# Patient Record
Sex: Male | Born: 1935 | Race: White | Hispanic: No | Marital: Married | Smoking: Former smoker
Health system: Southern US, Community
[De-identification: ages and names within clinical notes are randomized; demographics above are authoritative.]

## PROBLEM LIST (undated history)

## (undated) DIAGNOSIS — Z85820 Personal history of malignant melanoma of skin: Secondary | ICD-10-CM

## (undated) DIAGNOSIS — R7302 Impaired glucose tolerance (oral): Secondary | ICD-10-CM

## (undated) DIAGNOSIS — I2510 Atherosclerotic heart disease of native coronary artery without angina pectoris: Secondary | ICD-10-CM

## (undated) DIAGNOSIS — M5416 Radiculopathy, lumbar region: Secondary | ICD-10-CM

## (undated) DIAGNOSIS — H269 Unspecified cataract: Secondary | ICD-10-CM

## (undated) DIAGNOSIS — IMO0002 Reserved for concepts with insufficient information to code with codable children: Secondary | ICD-10-CM

## (undated) DIAGNOSIS — G909 Disorder of the autonomic nervous system, unspecified: Secondary | ICD-10-CM

## (undated) DIAGNOSIS — I493 Ventricular premature depolarization: Secondary | ICD-10-CM

## (undated) DIAGNOSIS — I10 Essential (primary) hypertension: Secondary | ICD-10-CM

## (undated) DIAGNOSIS — M1990 Unspecified osteoarthritis, unspecified site: Secondary | ICD-10-CM

## (undated) DIAGNOSIS — F341 Dysthymic disorder: Secondary | ICD-10-CM

## (undated) DIAGNOSIS — J309 Allergic rhinitis, unspecified: Secondary | ICD-10-CM

## (undated) DIAGNOSIS — N529 Male erectile dysfunction, unspecified: Secondary | ICD-10-CM

## (undated) DIAGNOSIS — I4892 Unspecified atrial flutter: Secondary | ICD-10-CM

## (undated) DIAGNOSIS — D649 Anemia, unspecified: Secondary | ICD-10-CM

## (undated) DIAGNOSIS — H9193 Unspecified hearing loss, bilateral: Secondary | ICD-10-CM

## (undated) DIAGNOSIS — W19XXXA Unspecified fall, initial encounter: Secondary | ICD-10-CM

## (undated) DIAGNOSIS — I252 Old myocardial infarction: Secondary | ICD-10-CM

## (undated) DIAGNOSIS — R011 Cardiac murmur, unspecified: Secondary | ICD-10-CM

## (undated) DIAGNOSIS — Z8719 Personal history of other diseases of the digestive system: Secondary | ICD-10-CM

## (undated) DIAGNOSIS — N320 Bladder-neck obstruction: Secondary | ICD-10-CM

## (undated) DIAGNOSIS — C4360 Malignant melanoma of unspecified upper limb, including shoulder: Secondary | ICD-10-CM

## (undated) DIAGNOSIS — K219 Gastro-esophageal reflux disease without esophagitis: Secondary | ICD-10-CM

## (undated) HISTORY — DX: Disorder of the autonomic nervous system, unspecified: G90.9

## (undated) HISTORY — DX: Impaired glucose tolerance (oral): R73.02

## (undated) HISTORY — DX: Essential (primary) hypertension: I10

## (undated) HISTORY — DX: Male erectile dysfunction, unspecified: N52.9

## (undated) HISTORY — DX: Personal history of other diseases of the digestive system: Z87.19

## (undated) HISTORY — PX: TONSILLECTOMY: SUR1361

## (undated) HISTORY — DX: Allergic rhinitis, unspecified: J30.9

## (undated) HISTORY — DX: Reserved for concepts with insufficient information to code with codable children: IMO0002

## (undated) HISTORY — PX: EXPLORATORY LAPAROTOMY: SUR591

## (undated) HISTORY — PX: EYE SURGERY: SHX253

## (undated) HISTORY — PX: CHOLECYSTECTOMY: SHX55

## (undated) HISTORY — DX: Unspecified fall, initial encounter: W19.XXXA

## (undated) HISTORY — DX: Atherosclerotic heart disease of native coronary artery without angina pectoris: I25.10

## (undated) HISTORY — DX: Ventricular premature depolarization: I49.3

## (undated) HISTORY — PX: CATARACT EXTRACTION, BILATERAL: SHX1313

## (undated) HISTORY — DX: Dysthymic disorder: F34.1

## (undated) HISTORY — DX: Malignant melanoma of unspecified upper limb, including shoulder: C43.60

## (undated) HISTORY — PX: HERNIA REPAIR: SHX51

## (undated) HISTORY — DX: Personal history of malignant melanoma of skin: Z85.820

## (undated) HISTORY — PX: OTHER SURGICAL HISTORY: SHX169

## (undated) HISTORY — DX: Bladder-neck obstruction: N32.0

## (undated) HISTORY — DX: Old myocardial infarction: I25.2

## (undated) HISTORY — DX: Unspecified atrial flutter: I48.92

## (undated) HISTORY — DX: Anemia, unspecified: D64.9

## (undated) HISTORY — PX: COLONOSCOPY: SHX174

---

## 1991-04-30 HISTORY — PX: CORONARY ANGIOPLASTY: SHX604

## 1998-12-19 ENCOUNTER — Ambulatory Visit (HOSPITAL_BASED_OUTPATIENT_CLINIC_OR_DEPARTMENT_OTHER): Admission: RE | Admit: 1998-12-19 | Discharge: 1998-12-19 | Payer: Self-pay

## 1999-04-16 ENCOUNTER — Ambulatory Visit (HOSPITAL_COMMUNITY): Admission: RE | Admit: 1999-04-16 | Discharge: 1999-04-16 | Payer: Self-pay

## 1999-07-17 ENCOUNTER — Emergency Department (HOSPITAL_COMMUNITY): Admission: EM | Admit: 1999-07-17 | Discharge: 1999-07-17 | Payer: Self-pay | Admitting: Emergency Medicine

## 2000-01-29 ENCOUNTER — Emergency Department (HOSPITAL_COMMUNITY): Admission: EM | Admit: 2000-01-29 | Discharge: 2000-01-29 | Payer: Self-pay | Admitting: Emergency Medicine

## 2000-01-29 ENCOUNTER — Encounter: Payer: Self-pay | Admitting: Emergency Medicine

## 2000-10-07 ENCOUNTER — Encounter (HOSPITAL_COMMUNITY): Admission: RE | Admit: 2000-10-07 | Discharge: 2000-11-18 | Payer: Self-pay | Admitting: Cardiology

## 2000-10-13 ENCOUNTER — Encounter: Payer: Self-pay | Admitting: Cardiology

## 2000-10-13 ENCOUNTER — Inpatient Hospital Stay (HOSPITAL_COMMUNITY): Admission: EM | Admit: 2000-10-13 | Discharge: 2000-10-26 | Payer: Self-pay | Admitting: Emergency Medicine

## 2000-10-16 ENCOUNTER — Encounter: Payer: Self-pay | Admitting: Cardiology

## 2000-10-17 ENCOUNTER — Encounter: Payer: Self-pay | Admitting: Cardiothoracic Surgery

## 2000-10-17 HISTORY — PX: CORONARY ARTERY BYPASS GRAFT: SHX141

## 2000-10-19 ENCOUNTER — Encounter: Payer: Self-pay | Admitting: Cardiothoracic Surgery

## 2000-10-20 ENCOUNTER — Encounter: Payer: Self-pay | Admitting: Cardiothoracic Surgery

## 2000-10-21 ENCOUNTER — Encounter: Payer: Self-pay | Admitting: Cardiothoracic Surgery

## 2000-10-22 ENCOUNTER — Encounter: Payer: Self-pay | Admitting: Cardiothoracic Surgery

## 2000-10-23 ENCOUNTER — Encounter: Payer: Self-pay | Admitting: Cardiothoracic Surgery

## 2000-12-09 ENCOUNTER — Encounter (HOSPITAL_COMMUNITY): Admission: RE | Admit: 2000-12-09 | Discharge: 2001-03-09 | Payer: Self-pay | Admitting: Cardiology

## 2001-03-05 ENCOUNTER — Encounter: Payer: Self-pay | Admitting: Cardiology

## 2001-03-05 ENCOUNTER — Inpatient Hospital Stay (HOSPITAL_COMMUNITY): Admission: EM | Admit: 2001-03-05 | Discharge: 2001-03-08 | Payer: Self-pay

## 2001-05-04 ENCOUNTER — Ambulatory Visit (HOSPITAL_COMMUNITY): Admission: RE | Admit: 2001-05-04 | Discharge: 2001-05-05 | Payer: Self-pay | Admitting: Internal Medicine

## 2001-05-04 HISTORY — PX: ATRIAL ABLATION SURGERY: SHX560

## 2002-07-05 ENCOUNTER — Encounter: Admission: RE | Admit: 2002-07-05 | Discharge: 2002-07-05 | Payer: Self-pay | Admitting: Orthopedic Surgery

## 2002-07-05 ENCOUNTER — Encounter: Payer: Self-pay | Admitting: Orthopedic Surgery

## 2002-07-06 ENCOUNTER — Ambulatory Visit (HOSPITAL_BASED_OUTPATIENT_CLINIC_OR_DEPARTMENT_OTHER): Admission: RE | Admit: 2002-07-06 | Discharge: 2002-07-06 | Payer: Self-pay | Admitting: Orthopedic Surgery

## 2002-07-06 HISTORY — PX: ARTHRODESIS: SHX136

## 2003-07-26 ENCOUNTER — Encounter: Admission: RE | Admit: 2003-07-26 | Discharge: 2003-07-26 | Payer: Self-pay | Admitting: Orthopedic Surgery

## 2004-10-01 ENCOUNTER — Ambulatory Visit: Payer: Self-pay | Admitting: Cardiology

## 2004-10-03 ENCOUNTER — Ambulatory Visit: Payer: Self-pay | Admitting: Cardiology

## 2005-10-03 ENCOUNTER — Ambulatory Visit: Payer: Self-pay | Admitting: Cardiology

## 2005-12-16 ENCOUNTER — Ambulatory Visit: Payer: Self-pay

## 2006-03-14 ENCOUNTER — Ambulatory Visit: Payer: Self-pay | Admitting: Internal Medicine

## 2006-03-18 ENCOUNTER — Ambulatory Visit: Payer: Self-pay | Admitting: Internal Medicine

## 2006-03-18 LAB — CONVERTED CEMR LAB
ALT: 23 units/L (ref 0–40)
AST: 19 units/L (ref 0–37)
Albumin: 4.1 g/dL (ref 3.5–5.2)
Alkaline Phosphatase: 50 units/L (ref 39–117)
BUN: 23 mg/dL (ref 6–23)
Basophils Absolute: 0 10*3/uL (ref 0.0–0.1)
Basophils Relative: 0.1 % (ref 0.0–1.0)
Bilirubin Urine: NEGATIVE
CO2: 28 meq/L (ref 19–32)
Calcium: 9.2 mg/dL (ref 8.4–10.5)
Chloride: 107 meq/L (ref 96–112)
Chol/HDL Ratio, serum: 2.9
Cholesterol: 111 mg/dL (ref 0–200)
Creatinine, Ser: 0.9 mg/dL (ref 0.4–1.5)
Eosinophil percent: 3.2 % (ref 0.0–5.0)
GFR calc non Af Amer: 88 mL/min
Glomerular Filtration Rate, Af Am: 107 mL/min/{1.73_m2}
Glucose, Bld: 113 mg/dL — ABNORMAL HIGH (ref 70–99)
HCT: 46.7 % (ref 39.0–52.0)
HDL: 37.8 mg/dL — ABNORMAL LOW (ref >39.0)
Hemoglobin, Urine: NEGATIVE
Hemoglobin: 15.4 g/dL (ref 13.0–17.0)
Ketones, ur: NEGATIVE mg/dL
LDL Cholesterol: 57 mg/dL (ref 0–99)
Leukocytes, UA: NEGATIVE
Lymphocytes Relative: 17.1 % (ref 12.0–46.0)
MCHC: 32.9 g/dL (ref 30.0–36.0)
MCV: 90.8 fL (ref 78.0–100.0)
Monocytes Absolute: 0.7 10*3/uL (ref 0.2–0.7)
Monocytes Relative: 9.6 % (ref 3.0–11.0)
Neutro Abs: 5.3 10*3/uL (ref 1.4–7.7)
Neutrophils Relative %: 70 % (ref 43.0–77.0)
Nitrite: NEGATIVE
PSA: 1.75 ng/mL (ref 0.10–4.00)
Platelets: 174 10*3/uL (ref 150–400)
Potassium: 4.5 meq/L (ref 3.5–5.1)
RBC: 5.14 M/uL (ref 4.22–5.81)
RDW: 13.1 % (ref 11.5–14.6)
Sodium: 141 meq/L (ref 135–145)
Specific Gravity, Urine: 1.02 (ref 1.000–1.03)
TSH: 1.46 microintl units/mL (ref 0.35–5.50)
Total Bilirubin: 1.1 mg/dL (ref 0.3–1.2)
Total Protein, Urine: NEGATIVE mg/dL
Total Protein: 6.3 g/dL (ref 6.0–8.3)
Triglyceride fasting, serum: 83 mg/dL (ref 0–149)
Urine Glucose: NEGATIVE mg/dL
Urobilinogen, UA: 0.2 (ref 0.0–1.0)
VLDL: 17 mg/dL (ref 0–40)
WBC: 7.5 10*3/uL (ref 4.5–10.5)
pH: 6.5 (ref 5.0–8.0)

## 2006-09-30 ENCOUNTER — Ambulatory Visit: Payer: Self-pay | Admitting: Cardiology

## 2006-11-27 DIAGNOSIS — C4360 Malignant melanoma of unspecified upper limb, including shoulder: Secondary | ICD-10-CM | POA: Insufficient documentation

## 2006-11-27 DIAGNOSIS — I2510 Atherosclerotic heart disease of native coronary artery without angina pectoris: Secondary | ICD-10-CM | POA: Insufficient documentation

## 2006-11-27 DIAGNOSIS — R351 Nocturia: Secondary | ICD-10-CM

## 2006-11-27 DIAGNOSIS — N401 Enlarged prostate with lower urinary tract symptoms: Secondary | ICD-10-CM | POA: Insufficient documentation

## 2006-11-27 DIAGNOSIS — N529 Male erectile dysfunction, unspecified: Secondary | ICD-10-CM | POA: Insufficient documentation

## 2006-11-27 DIAGNOSIS — I10 Essential (primary) hypertension: Secondary | ICD-10-CM | POA: Insufficient documentation

## 2007-02-03 ENCOUNTER — Ambulatory Visit: Payer: Self-pay | Admitting: Internal Medicine

## 2007-02-10 ENCOUNTER — Encounter: Admission: RE | Admit: 2007-02-10 | Discharge: 2007-02-10 | Payer: Self-pay | Admitting: Internal Medicine

## 2008-02-24 ENCOUNTER — Ambulatory Visit: Payer: Self-pay | Admitting: Cardiology

## 2008-03-02 ENCOUNTER — Ambulatory Visit: Payer: Self-pay

## 2008-03-02 ENCOUNTER — Ambulatory Visit: Payer: Self-pay | Admitting: Cardiology

## 2008-03-02 LAB — CONVERTED CEMR LAB
ALT: 21 units/L (ref 0–53)
AST: 19 units/L (ref 0–37)
Albumin: 4.2 g/dL (ref 3.5–5.2)
Alkaline Phosphatase: 43 units/L (ref 39–117)
BUN: 19 mg/dL (ref 6–23)
Bilirubin, Direct: 0.3 mg/dL (ref 0.0–0.3)
CO2: 31 meq/L (ref 19–32)
Calcium: 9.6 mg/dL (ref 8.4–10.5)
Chloride: 106 meq/L (ref 96–112)
Cholesterol: 127 mg/dL (ref 0–200)
Creatinine, Ser: 0.7 mg/dL (ref 0.4–1.5)
GFR calc Af Amer: 142 mL/min
GFR calc non Af Amer: 117 mL/min
Glucose, Bld: 97 mg/dL (ref 70–99)
HDL: 45.2 mg/dL (ref >39.0)
LDL Cholesterol: 65 mg/dL (ref 0–99)
Potassium: 5.2 meq/L — ABNORMAL HIGH (ref 3.5–5.1)
Sodium: 142 meq/L (ref 135–145)
Total Bilirubin: 1.2 mg/dL (ref 0.3–1.2)
Total CHOL/HDL Ratio: 2.8
Total Protein: 6.5 g/dL (ref 6.0–8.3)
Triglycerides: 84 mg/dL (ref 0–149)
VLDL: 17 mg/dL (ref 0–40)

## 2008-07-28 ENCOUNTER — Ambulatory Visit: Payer: Self-pay | Admitting: Internal Medicine

## 2008-08-23 ENCOUNTER — Ambulatory Visit: Payer: Self-pay | Admitting: Endocrinology

## 2008-08-23 DIAGNOSIS — IMO0002 Reserved for concepts with insufficient information to code with codable children: Secondary | ICD-10-CM | POA: Insufficient documentation

## 2008-09-04 ENCOUNTER — Ambulatory Visit: Payer: Self-pay | Admitting: Internal Medicine

## 2008-09-04 ENCOUNTER — Inpatient Hospital Stay (HOSPITAL_COMMUNITY): Admission: EM | Admit: 2008-09-04 | Discharge: 2008-09-05 | Payer: Self-pay | Admitting: Emergency Medicine

## 2008-09-05 HISTORY — PX: CARDIAC CATHETERIZATION: SHX172

## 2008-09-06 ENCOUNTER — Telehealth: Payer: Self-pay | Admitting: Cardiology

## 2008-09-13 DIAGNOSIS — I252 Old myocardial infarction: Secondary | ICD-10-CM | POA: Insufficient documentation

## 2008-09-13 DIAGNOSIS — F341 Dysthymic disorder: Secondary | ICD-10-CM | POA: Insufficient documentation

## 2008-09-13 DIAGNOSIS — D649 Anemia, unspecified: Secondary | ICD-10-CM | POA: Insufficient documentation

## 2008-09-13 DIAGNOSIS — Z85820 Personal history of malignant melanoma of skin: Secondary | ICD-10-CM | POA: Insufficient documentation

## 2008-09-13 DIAGNOSIS — Z8719 Personal history of other diseases of the digestive system: Secondary | ICD-10-CM | POA: Insufficient documentation

## 2008-09-14 ENCOUNTER — Encounter: Payer: Self-pay | Admitting: Physician Assistant

## 2008-09-14 ENCOUNTER — Ambulatory Visit: Payer: Self-pay | Admitting: Cardiology

## 2008-09-14 DIAGNOSIS — R0602 Shortness of breath: Secondary | ICD-10-CM | POA: Insufficient documentation

## 2008-09-15 ENCOUNTER — Telehealth: Payer: Self-pay | Admitting: Cardiology

## 2008-09-15 LAB — CONVERTED CEMR LAB
BUN: 23 mg/dL (ref 6–23)
CO2: 25 meq/L (ref 19–32)
Calcium: 9.2 mg/dL (ref 8.4–10.5)
Chloride: 111 meq/L (ref 96–112)
Creatinine, Ser: 0.8 mg/dL (ref 0.4–1.5)
GFR calc non Af Amer: 100.21 mL/min (ref >60)
Glucose, Bld: 133 mg/dL — ABNORMAL HIGH (ref 70–99)
Potassium: 4 meq/L (ref 3.5–5.1)
Sodium: 141 meq/L (ref 135–145)

## 2008-09-21 ENCOUNTER — Telehealth: Payer: Self-pay | Admitting: Cardiology

## 2008-09-21 ENCOUNTER — Encounter: Payer: Self-pay | Admitting: Cardiology

## 2008-10-10 ENCOUNTER — Encounter (HOSPITAL_COMMUNITY): Admission: RE | Admit: 2008-10-10 | Discharge: 2009-01-08 | Payer: Self-pay | Admitting: Cardiology

## 2008-10-12 ENCOUNTER — Ambulatory Visit: Payer: Self-pay | Admitting: Internal Medicine

## 2008-10-12 DIAGNOSIS — R42 Dizziness and giddiness: Secondary | ICD-10-CM | POA: Insufficient documentation

## 2008-10-12 DIAGNOSIS — I959 Hypotension, unspecified: Secondary | ICD-10-CM | POA: Insufficient documentation

## 2008-10-19 ENCOUNTER — Encounter: Payer: Self-pay | Admitting: Cardiology

## 2008-10-19 ENCOUNTER — Ambulatory Visit: Payer: Self-pay

## 2008-10-21 ENCOUNTER — Encounter: Admission: RE | Admit: 2008-10-21 | Discharge: 2008-10-21 | Payer: Self-pay | Admitting: Family Medicine

## 2008-11-16 ENCOUNTER — Encounter: Payer: Self-pay | Admitting: Cardiology

## 2008-11-21 ENCOUNTER — Telehealth: Payer: Self-pay | Admitting: Cardiology

## 2008-11-23 ENCOUNTER — Ambulatory Visit: Payer: Self-pay | Admitting: Cardiology

## 2008-11-23 ENCOUNTER — Encounter: Payer: Self-pay | Admitting: Physician Assistant

## 2008-11-23 DIAGNOSIS — R079 Chest pain, unspecified: Secondary | ICD-10-CM | POA: Insufficient documentation

## 2008-11-25 ENCOUNTER — Encounter: Payer: Self-pay | Admitting: Cardiology

## 2008-11-30 ENCOUNTER — Telehealth (INDEPENDENT_AMBULATORY_CARE_PROVIDER_SITE_OTHER): Payer: Self-pay | Admitting: *Deleted

## 2008-12-01 ENCOUNTER — Ambulatory Visit: Payer: Self-pay

## 2008-12-01 ENCOUNTER — Encounter: Payer: Self-pay | Admitting: Cardiology

## 2008-12-12 ENCOUNTER — Ambulatory Visit: Payer: Self-pay | Admitting: Cardiology

## 2009-01-27 ENCOUNTER — Encounter: Payer: Self-pay | Admitting: Cardiology

## 2009-05-15 ENCOUNTER — Ambulatory Visit: Payer: Self-pay | Admitting: Cardiology

## 2009-05-15 DIAGNOSIS — E782 Mixed hyperlipidemia: Secondary | ICD-10-CM | POA: Insufficient documentation

## 2009-05-16 LAB — CONVERTED CEMR LAB
ALT: 20 units/L (ref 0–53)
AST: 26 units/L (ref 0–37)
Albumin: 4.2 g/dL (ref 3.5–5.2)
Alkaline Phosphatase: 45 units/L (ref 39–117)
BUN: 14 mg/dL (ref 6–23)
Bilirubin, Direct: 0.2 mg/dL (ref 0.0–0.3)
CO2: 25 meq/L (ref 19–32)
Calcium: 9.2 mg/dL (ref 8.4–10.5)
Chloride: 110 meq/L (ref 96–112)
Cholesterol: 111 mg/dL (ref 0–200)
Creatinine, Ser: 0.8 mg/dL (ref 0.4–1.5)
GFR calc non Af Amer: 100.03 mL/min (ref >60)
Glucose, Bld: 99 mg/dL (ref 70–99)
HDL: 43.6 mg/dL (ref >39.00)
LDL Cholesterol: 48 mg/dL (ref 0–99)
Potassium: 4.4 meq/L (ref 3.5–5.1)
Sodium: 144 meq/L (ref 135–145)
Total Bilirubin: 0.8 mg/dL (ref 0.3–1.2)
Total CHOL/HDL Ratio: 3
Total Protein: 6.3 g/dL (ref 6.0–8.3)
Triglycerides: 98 mg/dL (ref 0.0–149.0)
VLDL: 19.6 mg/dL (ref 0.0–40.0)

## 2009-06-11 ENCOUNTER — Emergency Department (HOSPITAL_COMMUNITY): Admission: EM | Admit: 2009-06-11 | Discharge: 2009-06-11 | Payer: Self-pay | Admitting: Emergency Medicine

## 2009-06-28 ENCOUNTER — Ambulatory Visit: Payer: Self-pay | Admitting: Cardiovascular Disease

## 2009-06-28 DIAGNOSIS — G903 Multi-system degeneration of the autonomic nervous system: Secondary | ICD-10-CM | POA: Insufficient documentation

## 2009-08-31 ENCOUNTER — Telehealth: Payer: Self-pay | Admitting: Internal Medicine

## 2009-11-07 ENCOUNTER — Ambulatory Visit: Payer: Self-pay | Admitting: Cardiology

## 2010-05-20 ENCOUNTER — Encounter: Payer: Self-pay | Admitting: Otolaryngology

## 2010-05-31 NOTE — Assessment & Plan Note (Signed)
Summary: Cardiology Nuclear Study  Nuclear Med Background Indications for Stress Test: Evaluation for Ischemia, Graft Patency   History: Ablation, Angioplasty, CABG, Echo, Heart Catheterization, Myocardial Infarction, Myocardial Perfusion Study  History Comments: Ablation: For afib/flutter;PTCA x3; '02 MI>CABG x 4;'09 MPS: mild infero-apical defect, no ischemia, EF=64%; 09/04/08 Cath: Patent Grafts, 99% Dx, 70% prox. native LAD 10/19/08 Echo: EF=55-60%  Symptoms: Chest Tightness with Exertion, Dizziness, DOE, Light-Headedness, Palpitations    Nuclear Pre-Procedure Cardiac Risk Factors: Family History - CAD, History of Smoking, Hypertension, Lipids Caffeine/Decaff Intake: none NPO After: 8:00 PM Lungs: Clear IV 0.9% NS with Angio Cath: 20g     IV Site: (R) AC IV Started by: Stanton Kidney EMT-P Chest Size (in) 38     Height (in): 70 Weight (lb): 178 BMI: 25.63  Nuclear Med Study 1 or 2 day study:  1 day     Stress Test Type:  Treadmill/Adenosine Reading MD:  Olga Millers, MD     Referring MD:  Valera Castle, MD Resting Radionuclide:  Technetium 54m Tetrofosmin     Resting Radionuclide Dose:  11.0 mCi  Stress Radionuclide:  Technetium 22m Tetrofosmin     Stress Radionuclide Dose:  33.0 mCi   Stress Protocol Exercise Time (min):  4:00 min     Max HR:  75 bpm Max Systolic BP: 127 mm HgRate Pressure Product:  9525 Dose of Adenosine:  45.3 mg    Stress Test Technologist:  Rea College CMA-N     Nuclear Technologist:  Domenic Polite CNMT  Rest Procedure  Myocardial perfusion imaging was performed at rest 45 minutes following the intraveneous administration of Myoview Technetium 39m Tetrofosmin.  Stress Procedure  The patient received IV adenosine at 140 mcg/kg/min for 4-minutes with concurrent submaximal exercise (1.52mph, 0% grade). There were no significant changes with infusion. Myoview was injected at the 2-minute mark and quantitative spect images were obtained.  QPS Raw Data  Images:  Acuisition technically good; normal left ventricular size. Stress Images:  There is decreased uptake in the apex. Rest Images:  There is decreased uptake in the apex less prominent compared to the stress images. Subtraction (SDS):  These findings are consistent with mild apical thinning and  ischemia. Transient Ischemic Dilatation:  1.12  (Normal <1.22)  Lung/Heart Ratio:  .38  (Normal <0.45)  Quantitative Gated Spect Images QGS EDV:  77 ml QGS ESV:  27 ml QGS EF:  65 % QGS cine images:  Normal wall motion.   Overall Impression  Exercise Capacity: Adenosine with low level exercise. ECG Impression: No significant ST segment change suggestive of ischemia. Overall Impression: Low risk adenosine nuclear study with mild apical thinning and mild apical ischemia as well.  Appended Document: Cardiology Nuclear Study Stable.  Reviewed Juanito Doom, MD  Appended Document: Cardiology Nuclear Study RN s/w Pt made aware of test results.

## 2010-05-31 NOTE — Progress Notes (Signed)
Summary: chest pain  Phone Note From Other Clinic   Caller: Nurse Summary of Call: cardiac rehab states pt having chest pain with excertion. please call and advise what to do. [phone] ask for Terri or Olinty. Initial call taken by: Edman Circle,  November 21, 2008 10:29 AM  Follow-up for Phone Call        talked with Terri at Cardiac  Rehab-stated pt c/o chest pain at Cardiac Rehab this morning-pt was resting and feeling  better- I recommeded ER evaluation if Terri  felt pt needed to be evaluated today-Terri stated that she did not think so given the results of his recent cath-he does have an appt with Dr Daleen Squibb on 12/12/08 and she is requesting to get an earlier appt-I do not see any earlier availability with Dr Daleen Squibb but have given pt appt 11/23/08 with PA     Appended Document: chest pain  Reviewed Juanito Doom, MD

## 2010-05-31 NOTE — Progress Notes (Signed)
Summary: Appt Scheduled with PA  Medications Added NEXIUM 40 MG CPDR (ESOMEPRAZOLE MAGNESIUM)  NITROGLYCERIN 0.4 MG SUBL (NITROGLYCERIN) Place 1 tablet under tongue as directed NEXIUM 40 MG CPDR (ESOMEPRAZOLE MAGNESIUM)  NITROGLYCERIN 0.4 MG SUBL (NITROGLYCERIN) Place 1 tablet under tongue as directed       Phone Note Call from Patient Call back at Home Phone [phone]   Caller: Patient Reason for Call: Talk to Nurse Summary of Call: sunday severe chest pain, shortness breath [facility], wants to see dr Trelon Plush asap pls call pt........  Initial call taken by: Sydell Axon,  Sep 06, 2008 10:10 AM  Follow-up for Phone Call        Phone Call Completed. Pt states he was unaware of Appt with the PA scheduled for 09-22-08 @ 1:45PM. RN reviewed Appt details with the Pt. Pt verbalize understanding. Follow-up by: Bernita Raisin, RN, BSN,  Sep 06, 2008 10:34 AM    New/Updated Medications: NEXIUM 40 MG CPDR (ESOMEPRAZOLE MAGNESIUM)  NITROGLYCERIN 0.4 MG SUBL (NITROGLYCERIN) Place 1 tablet under tongue as directed NEXIUM 40 MG CPDR (ESOMEPRAZOLE MAGNESIUM)  NITROGLYCERIN 0.4 MG SUBL (NITROGLYCERIN) Place 1 tablet under tongue as directed

## 2010-05-31 NOTE — Progress Notes (Signed)
Summary: Nuclear Pre-Procedure  Phone Note Outgoing Call Call back at [facility] Phone [phone]   Call placed by: Stanton Kidney, EMT-P,  November 30, 2008 3:07 PM Action Taken: Phone Call Completed Summary of Call: Reviewed information on Myoview Information Sheet (see scanned document for further details).  Spoke with Pt's wife.    Nuclear Med Background Indications for Stress Test: Evaluation for Ischemia, Graft Patency   History: Ablation, Angioplasty, CABG, Echo, Heart Catheterization, Myocardial Infarction, Myocardial Perfusion Study  History Comments: Ablation: For A. Fib Angioplasty: (x3) '02 MI>CABG x 4 11/09 MPS: mild inf.apical defect, (-) ischemia, EF=64% 09/04/08 Heart Cath: Patent Grafts 10/19/08 Echo: EF=55-60%  Symptoms: Chest Tightness with Exertion, DOE    Nuclear Pre-Procedure Cardiac Risk Factors: Family History - CAD, History of Smoking, Hypertension, Lipids Height (in): 70

## 2010-05-31 NOTE — Assessment & Plan Note (Signed)
Summary: per check out/sf   Visit Type:  rov Primary Provider:  Elon Combs  CC:  sob..no other complaints today.  History of Present Illness: [name] returns today for evaluation and management of his exertional angina and coronary disease.  On his last visit, we increased his beta blocker. This has helped decrease his exertional angina. He still has some shortness of breath with activity. His functional capacity is excellent having remodeled a house from start to finish.  Denies orthopnea, PND or peripheral edema. He's had no palpitations or syncope.  Current Medications (verified): 1)  Zoloft 50 Mg  Tabs (Sertraline Hcl) .... Once Daily 2)  Flomax 0.4 Mg  Cp24 (Tamsulosin Hcl) .... Once Daily 3)  Toprol Xl 50 Mg  Tb24 (Metoprolol Succinate) .... Take 1 and 1/2 Tablet Daily. 4)  Crestor 10 Mg  Tabs (Rosuvastatin Calcium) .... Once Daily 5)  Ambien 10 Mg  Tabs (Zolpidem Tartrate) .... 1/2 Tab At Bedtime As Needed 6)  Proscar 5 Mg Tabs (Finasteride) .Marland Kitchen.. 1 By Mouth Qd 7)  Omeprazole 20 Mg Cpdr (Omeprazole) .Marland Kitchen.. 1 Tab Once Daily 8)  Nitroglycerin 0.4 Mg Subl (Nitroglycerin) .... Place 1 Tablet Under Tongue As Directed 9)  Aspirin 81 Mg Tbec (Aspirin) .... Take One Tablet By Mouth Daily 10)  Nortriptyline Hcl 10 Mg Caps (Nortriptyline Hcl) .Marland Kitchen.. 1 Tab At Bedtime X 1 Week Then 2 Tabs At Bedtime.Fayne Norrie Not Started Yet 11)  Fioricet 50-325-40 Mg Tabs (Butalbital-Apap-Caffeine) .Marland Kitchen.. 1 Tab As Needed 12)  Nitroglycerin 0.4 Mg Subl (Nitroglycerin) .... One Tablet Under Tongue Every 5 Minutes As Needed For Chest Pain---May Repeat Times Three  Allergies: 1)  ! Cipro (Ciprofloxacin Hcl) 2)  ! 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 Arthrodesis of left long finger distal interphalangeal joint with     Kirschner wire fixation times three.  2. Resection of nail fold mucus cyst.   SURGEON:  Katy Fitch. Sypher, M.D.  Proc. Date: 10/17/00  PROCEDURE:  Coronary artery bypass grafting x 4 with the left internal mammary to the left anterior descending coronary artery, sequential reversed saphenous vein graft to the first and second obtuse marginals, reversed saphenous vein graft to the posterior descending coronary artery.  SURGEON:  Gwenith Daily. Tyrone Sage, M.D.  Family History: Last updated: 09/13/2008 Both of his parents were in their mid to late 80s when   they died.  His father had heart disease, but his mother did not.  No   siblings have heart disease.      Social History: Last updated: 09/13/2008  He lives in [place] with his wife.  He is retired   from [employer].  He quit tobacco greater than 50 years ago and does not   abuse alcohol or drugs.      Risk Factors: Smoking Status: quit (11/27/2006)  Review of Systems       negative other than history of present illness  Vital Signs:  Patient profile:   77 year old male Height:      70 inches Weight:      181 pounds BMI:  26.06 Pulse rate:   72 / minute Pulse rhythm:   regular BP sitting:   142 / 80  (left arm) Cuff size:   large  Vitals Entered By: Danielle Rankin, CMA (May 15, 2009 10:35 AM)  Physical Exam  General:  Well developed, well nourished, in no acute distress. Head:  normocephalic and atraumatic Eyes:  PERRLA/EOM intact; conjunctiva and lids normal. Mouth:  Teeth, gums and palate normal. Oral mucosa normal. Neck:  Neck supple, no  JVD. No masses, thyromegaly or abnormal cervical nodes. Chest Troy Combs:  no deformities or breast masses noted Lungs:  Clear bilaterally to auscultation and percussion. Heart:  Non-displaced PMI, chest non-tender; regular rate and rhythm, S1, S2 without murmurs, rubs or gallops. Carotid upstroke normal, no bruit. Normal abdominal aortic size, no bruits. Femorals normal pulses, no bruits. Pedals normal pulses. No edema, no varicosities. Msk:  Back normal, normal gait. Muscle strength and tone normal. Pulses:  pulses normal in all 4 extremities Extremities:  No clubbing or cyanosis. Neurologic:  Alert and oriented x 3. Skin:  Intact without lesions or rashes. Psych:  Normal affect.   Problems:  Medical Problems Added: 1)  Dx of Encounter For Long-term Use of Other Medications  (ICD-V58.69) 2)  Dx of Hyperlipidemia Type Iib / Iii  (ICD-272.2)  Impression & Recommendations:  Problem # 1:  CORONARY ARTERY DISEASE (ICD-414.00) His exertional angina is better. We will continue his current medications. His updated medication list for this problem includes:    Toprol Xl 50 Mg Tb24 (Metoprolol succinate) .Marland Kitchen... Take 1 and 1/2 tablet daily.    Nitroglycerin 0.4 Mg Subl (Nitroglycerin) .Marland Kitchen... Place 1 tablet under tongue as directed    Aspirin 81 Mg Tbec (Aspirin) .Marland Kitchen... Take one tablet by mouth daily    Nitroglycerin 0.4 Mg Subl (Nitroglycerin) ..... One tablet under tongue every 5 minutes as needed for chest pain---may repeat times three  Problem # 2:  OLD MYOCARDIAL INFARCTION (ICD-412)  His updated medication list for this problem includes:    Toprol Xl 50 Mg Tb24 (Metoprolol succinate) .Marland Kitchen... Take 1 and 1/2 tablet daily.    Nitroglycerin 0.4 Mg Subl (Nitroglycerin) .Marland Kitchen... Place 1 tablet under tongue as directed    Aspirin 81 Mg Tbec (Aspirin) .Marland Kitchen... Take one tablet by mouth daily    Nitroglycerin 0.4 Mg Subl (Nitroglycerin) ..... One tablet under tongue every 5 minutes as needed for chest pain---may  repeat times three  Problem # 3:  HYPERLIPIDEMIA TYPE IIB / III (ICD-272.2) Assessment: Unchanged  He states he would like [place] to check these. His insurance has changed. We will have him set up for fasting lipids and a conference metabolic panel. His updated medication list for this problem includes:    Crestor 10 Mg Tabs (Rosuvastatin calcium) ..... Once daily  Orders: TLB-BMP (Basic Metabolic Panel-BMET) (80048-METABOL) TLB-Lipid Panel (80061-LIPID)  Other Orders: TLB-Hepatic/Liver Function Pnl (80076-HEPATIC)  Patient Instructions: 1)  Your physician recommends that you schedule a follow-up appointment in: 6 MONTHS WITH DR Verona Hartshorn DUE JULY 2)  Your physician recommends that you return for lab work [id number] BMET LIPID LIVER  3)  Your physician recommends that you continue on your current medications as directed. Please refer to the Current Medication list given to you today.

## 2010-05-31 NOTE — Progress Notes (Signed)
Summary: med refill  Phone Note Refill Request Message from:  Fax from Pharmacy on Aug 31, 2009 4:54 PM  Refills Requested: Medication #1:  CRESTOR 10 MG  TABS once daily  Medication #2:  PROSCAR 5 MG TABS 1 by mouth qd Next Appointment Scheduled: none Initial call taken by: Lucious Groves,  Aug 31, 2009 4:54 PM    Prescriptions: PROSCAR 5 MG TABS (FINASTERIDE) 1 by mouth qd  #90 x 3   Entered by:   Lucious Groves   Authorized by:   Tresa Garter MD   Signed by:   Lucious Groves on 08/31/2009   Method used:   Faxed to ...       [facility]* (mail-order)             ,          Ph: [phone]       Fax: [phone]   RxID:   [id number] CRESTOR 10 MG  TABS (ROSUVASTATIN CALCIUM) once daily  #90 x 3   Entered by:   Lucious Groves   Authorized by:   Tresa Garter MD   Signed by:   Lucious Groves on 08/31/2009   Method used:   Faxed to ...       [facility]* (mail-order)             ,          Ph: [phone]       Fax: [phone]   RxID:   [id number]

## 2010-05-31 NOTE — Progress Notes (Signed)
Summary: Results: CT and Lab  Phone Note Call from Patient Call back at Home Phone [phone]   Caller: Patient Reason for Call: Talk to Nurse Summary of Call: calling back to speak with ana Initial call taken by: Lorne Skeens,  Sep 15, 2008 2:12 PM  Follow-up for Phone Call        RN attempted to call back, no answering machine. Bernita Raisin, RN, BSN  Sep 15, 2008 2:27 PM  RN s/w Pt re: CT and lab results. Bernita Raisin, RN, BSN  Sep 15, 2008 3:20 PM  Follow-up by: Bernita Raisin, RN, BSN,  Sep 15, 2008 3:20 PM

## 2010-05-31 NOTE — Assessment & Plan Note (Signed)
Summary: 6 MO F/U /CY   Visit Type:  6  mo f/u Primary Provider:  Elon Alas  CC:  sob..pt c/o feeling like throat is constricted says he has felt this for the last 6 mo .Marland Kitchen..no other complaints today.  History of Present Illness: Mr Gehl returns today for evaluation management of his coronary disease.  He has  no angina or ischemic symptoms. His biggest complaint is that of getting lightheaded after he stands up. Sometimes takes several minutes.  He admits 2 having no thirst drive. He also restrict sodium. He does have a history of hypertension which is actually been extremely well controlled.    Clinical Reports Reviewed:  Cardiac Cath:  09/05/2008: Cardiac Cath Findings:   ASSESSMENT AND PLAN:  This is a 77 year old with a history of coronary   artery disease, status post coronary artery bypass graft who presents   with an episode of chest pain as well as exertional dyspnea.  It was   found that all of his grafts were patent.  He has a vein graft to the   distal right coronary artery,  sequential vein graft to the OM1/OM2, and   a left internal mammary artery to the left anterior descending.  There   is a competitive flow down the distal left anterior descending as the   proximal left anterior descending has only about a 70% stenosis in it.   The native mid right coronary artery, the native first obtuse marginal,   and the native second obtuse marginal are all occluded.  I do not see   disease to explain the patient's chest pain as there do not appear to be   lesions that would cause significant ischemia.  There was a 99% stenosis   of proximal first diagonal; however, this vessel is very small and   supplies very little territory.  The patient also complains of dyspnea   on exertion; however, left ventricular systolic function appears normal   and the left ventricular end-diastolic pressure is normal at 7 mmHg.   Our plan at this time would be medical management of  coronary artery   disease.               Marca Ancona, MD    Current Medications (verified): 1)  Zoloft 50 Mg  Tabs (Sertraline Hcl) .... Once Daily 2)  Flomax 0.4 Mg  Cp24 (Tamsulosin Hcl) .... Once Daily 3)  Toprol Xl 50 Mg  Tb24 (Metoprolol Succinate) .... Take 1  Tablet Daily. 4)  Crestor 10 Mg  Tabs (Rosuvastatin Calcium) .... Once Daily 5)  Ambien 10 Mg  Tabs (Zolpidem Tartrate) .... 1/2 Tab At Bedtime As Needed 6)  Proscar 5 Mg Tabs (Finasteride) .Marland Kitchen.. 1 By Mouth Qd 7)  Omeprazole 20 Mg Cpdr (Omeprazole) .Marland Kitchen.. 1 Tab Once Daily 8)  Nitroglycerin 0.4 Mg Subl (Nitroglycerin) .... Place 1 Tablet Under Tongue As Directed 9)  Aspirin 81 Mg Tbec (Aspirin) .... Take One Tablet By Mouth Daily 10)  Nortriptyline Hcl 10 Mg Caps (Nortriptyline Hcl) .... 2 Tab At Bedtime  Allergies: 1)  ! Cipro (Ciprofloxacin Hcl) 2)  ! * Ivp Dye  Review of Systems       negative other than history of present illness  Vital Signs:  Patient profile:   77 year old male Height:      70 inches Weight:      179 pounds BMI:     25.78 Pulse rate:   81 / minute Pulse rhythm:  irregular BP sitting:   110 / 80  (left arm) Cuff size:   large  Vitals Entered By: Danielle Rankin, CMA (November 07, 2009 2:26 PM)  Physical Exam  General:  Well developed, well nourished, in no acute distress. Head:  normocephalic and atraumatic Eyes:  PERRLA/EOM intact; conjunctiva and lids normal. Neck:  Neck supple, no JVD. No masses, thyromegaly or abnormal cervical nodes. Chest Kagan Hietpas:  no deformities or breast masses noted Lungs:  Clear bilaterally to auscultation and percussion. Heart:  Non-displaced PMI, chest non-tender; regular rate and rhythm, S1, S2 without murmurs, rubs or gallops. Carotid upstroke normal, no bruit. Normal abdominal aortic size, no bruits. Femorals normal pulses, no bruits. Pedals normal pulses. No edema, no varicosities. Msk:  Back normal, normal gait. Muscle strength and tone normal. Pulses:  pulses  normal in all 4 extremities Extremities:  No clubbing or cyanosis. Neurologic:  Alert and oriented x 3. Skin:  Intact without lesions or rashes. Psych:  Normal affect.   EKG  Procedure date:  11/07/2009  Findings:      normal sinus rhythm, normal EKG  Impression & Recommendations:  Problem # 1:  ORTHOSTATIC HYPOTENSION (ICD-458.0) Assessment New Clinically, he is orthostatic intermittently. He could admits to not having a thirst drive. I told him this is not unusual in patients as they get older. Flomax is also contributing.  I have asked him to liberalize sodium and to drink more free water.  Problem # 2:  HYPERTENSION (ICD-401.9) Assessment: Improved  His updated medication list for this problem includes:    Toprol Xl 50 Mg Tb24 (Metoprolol succinate) .Marland Kitchen... Take 1  tablet daily.    Aspirin 81 Mg Tbec (Aspirin) .Marland Kitchen... Take one tablet by mouth daily  Problem # 3:  CORONARY ARTERY DISEASE (ICD-414.00) Assessment: Unchanged  His updated medication list for this problem includes:    Toprol Xl 50 Mg Tb24 (Metoprolol succinate) .Marland Kitchen... Take 1  tablet daily.    Nitroglycerin 0.4 Mg Subl (Nitroglycerin) .Marland Kitchen... Place 1 tablet under tongue as directed    Aspirin 81 Mg Tbec (Aspirin) .Marland Kitchen... Take one tablet by mouth daily  Orders: EKG w/ Interpretation (93000)  Patient Instructions: 1)  Your physician recommends that you schedule a follow-up appointment in: 6 months with Dr. Daleen Squibb 2)  Your physician recommends that you continue on your current medications as directed. Please refer to the Current Medication list given to you today. 3)  Try to drink at least 2 extra glasses of water daily

## 2010-05-31 NOTE — Assessment & Plan Note (Signed)
Summary: rov   Visit Type:  Follow-up  CC:  chest pain and sob.  History of Present Illness: This is a 77 year old white male patient, who has history of coronary artery disease, status post CABG x4 in 2002. He was recently admitted to the hospital with chest pain in early May and underwent cardiac catheterization. This revealed patent grafts and normal LV function. He did have a 99% ostial stenosis in the small first diagonal. He also had competitive flow in the LAD. A proximal native LAD had a long 70% stenosis.  The patient was at cardiac rehabilitation and developed some chest tightness 2 days ago. He continues to have this with exercise at home. He says when he walks up his driveway after getting his mail he becomes short of breath. He says he takes very little for him to become short of breath. His Toprol was decreased. A few months back because of fatigue, but he says this has not made his symptoms worse.  Current Medications (verified): 1)  Zoloft 50 Mg  Tabs (Sertraline Hcl) .... Once Daily 2)  Flomax 0.4 Mg  Cp24 (Tamsulosin Hcl) .... Once Daily 3)  Toprol Xl 50 Mg  Tb24 (Metoprolol Succinate) .... Once Daily 4)  Crestor 10 Mg  Tabs (Rosuvastatin Calcium) .... Once Daily 5)  Ambien 10 Mg  Tabs (Zolpidem Tartrate) .Marland Kitchen.. 1 By Mouth At Bedtime As Needed For Insomnia 6)  Proscar 5 Mg Tabs (Finasteride) .Marland Kitchen.. 1 By Mouth Qd 7)  Omeprazole 20 Mg Cpdr (Omeprazole) .... 2  By Mouth Daily 8)  Ecotrin Low Strength 81 Mg  Tbec (Aspirin) .... Once Daily 9)  Nitroglycerin 0.4 Mg Subl (Nitroglycerin) .... Place 1 Tablet Under Tongue As Directed  Allergies: 1)  ! Cipro (Ciprofloxacin Hcl) 2)  ! 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 Neck: No JVD, HJR, Bruit, or thyroid enlargement Lungs: No tachypnea, clear without wheezing, rales, or rhonchi Cardiovascular: RRR, PMI not displaced, heart sounds normal, no murmurs, gallops, bruit, thrill, or heave. Abdomen: BS normal. Soft without organomegaly, masses, lesions or tenderness. Extremities: without cyanosis, clubbing or edema. Good distal pulses bilateral SKin: Warm, no lesions or rashes  Musculoskeletal: No deformities Neuro: no focal signs    EKG  Procedure date:  11/23/2008  Findings:      normal sinus rhythm with nonspecific ST-T wave changes  Impression & Recommendations:  Problem # 1:  CHEST PAIN UNSPECIFIED (ICD-786.50) patient continues to have chest pain, which sounds like angina. Cardiac Degree did not reveal a source of ischemia. He does have a 99% ostial diagonal lesion, as well as competitive flow in the LAD. Because of this we will perform a stress Myoview. I also offered for him  to take Imdur, but he wants to hold off on this at this time. His updated medication list for this problem includes:    Toprol Xl 50 Mg Tb24 (Metoprolol succinate) ..... Once daily    Ecotrin Low Strength 81 Mg Tbec (Aspirin) ..... Once daily    Nitroglycerin 0.4 Mg Subl (Nitroglycerin) .Marland Kitchen... Place 1 tablet under tongue as directed  Orders: EKG w/ Interpretation (93000) Nuclear Stress Test (Nuc Stress Test)  Problem # 2:   HYPERTENSION (ICD-401.9) blood pressure is stable His updated medication list for this problem includes:    Toprol Xl 50 Mg Tb24 (Metoprolol succinate) ..... Once daily    Ecotrin Low Strength 81 Mg Tbec (Aspirin) ..... Once daily  Patient Instructions: 1)  Your physician has requested that you have an adenosine myoview.  For further information please visit [email or web address]  Please follow instruction sheet, as given. 2)  Dr. Daleen Squibb in one month

## 2010-05-31 NOTE — Assessment & Plan Note (Signed)
Summary: med refill/$50/cd   Vital Signs:  Patient profile:   77 year old male Height:      70 inches Weight:      185 pounds BMI:     26.64 Temp:     97.5 degrees F oral Pulse rate:   85 / minute BP sitting:   158 / 84  (left arm)  Vitals Entered By: Tora Perches (July 28, 2008 11:14 AM) Is Patient Diabetic? No   History of Present Illness: F/u HTN, CAD, BPH. Has been out of Rx  Current Medications (verified): 1)  Nexium 40 Mg  Cpdr (Esomeprazole Magnesium) .... Once Daily 2)  Zoloft 50 Mg  Tabs (Sertraline Hcl) .... Once Daily 3)  Ecotrin Low Strength 81 Mg  Tbec (Aspirin) .... Once Daily 4)  Flomax 0.4 Mg  Cp24 (Tamsulosin Hcl) .... Once Daily 5)  Toprol Xl 50 Mg  Tb24 (Metoprolol Succinate) .... Once Daily 6)  Foltx 2.5-25-2 Mg  Tabs (Fa-Pyridoxine-Cyancobalamin) .... Once Daily 7)  Crestor 10 Mg  Tabs (Rosuvastatin Calcium) .... Once Daily 8)  Ambien 10 Mg  Tabs (Zolpidem Tartrate) .Marland Kitchen.. 1 By Mouth At Bedtime As Needed For Insomnia 9)  Avodart 0.5 Mg Caps (Dutasteride) .... Once Daily  Allergies: 1)  ! Cipro (Ciprofloxacin Hcl) 2)  ! * Ivp Dye  Past History:  Past Medical History:    Coronary artery disease    Hypertension     (11/27/2006)  Risk Factors:    Alcohol Use: N/A    >5 drinks/d w/in last 3 months: N/A    Caffeine Use: N/A    Diet: N/A    Exercise: N/A  Risk Factors:    Smoking Status: quit (11/27/2006)    Packs/Day: N/A    Cigars/wk: N/A    Pipe Use/wk: N/A    Cans of tobacco/wk: N/A    Passive Smoke Exposure: N/A  Family History:    Reviewed history and no changes required:  Social History:    Reviewed history and no changes required:  Physical Exam  General:  Well-developed,well-nourished,in no acute distress; alert,appropriate and cooperative throughout examination Eyes:  No corneal or conjunctival inflammation noted. EOMI. Perrla. Funduscopic exam benign, without hemorrhages, exudates or papilledema. Vision grossly normal. Nose:   External nasal examination shows no deformity or inflammation. Nasal mucosa are pink and moist without lesions or exudates. Mouth:  Oral mucosa and oropharynx without lesions or exudates.  Teeth in good repair. Neck:  No deformities, masses, or tenderness noted. Lungs:  Normal respiratory effort, chest expands symmetrically. Lungs are clear to auscultation, no crackles or wheezes. Heart:  Normal rate and regular rhythm. S1 and S2 normal without gallop, murmur, click, rub or other extra sounds. Abdomen:  Bowel sounds positive,abdomen soft and non-tender without masses, organomegaly or hernias noted. Msk:  No deformity or scoliosis noted of thoracic or lumbar spine.   Neurologic:  No cranial nerve deficits noted. Station and gait are normal. Plantar reflexes are down-going bilaterally. DTRs are symmetrical throughout. Sensory, motor and coordinative functions appear intact. Skin:  Intact without suspicious lesions or rashes Psych:  Cognition and judgment appear intact. Alert and cooperative with normal attention span and concentration. No apparent delusions, illusions, hallucinations   Impression & Recommendations:  Problem # 1:  CORONARY ARTERY DISEASE (ICD-414.00) Assessment Comment Only  His updated medication list for this problem includes:    Toprol Xl 50 Mg Tb24 (Metoprolol succinate) ..... Once daily    Ecotrin Low Strength 81 Mg Tbec (Aspirin) .Marland KitchenMarland KitchenMarland KitchenMarland Kitchen  Once daily  Problem # 2:  HYPERTENSION (ICD-401.9) Assessment: Comment Only  His updated medication list for this problem includes:    Toprol Xl 50 Mg Tb24 (Metoprolol succinate) ..... 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 Once daily 2)  Flomax 0.4 Mg Cp24 (Tamsulosin hcl) .... Once daily 3)  Toprol Xl 50 Mg Tb24  (Metoprolol succinate) .... Once daily 4)  Crestor 10 Mg Tabs (Rosuvastatin calcium) .... Once daily 5)  Ambien 10 Mg Tabs (Zolpidem tartrate) .Marland Kitchen.. 1 by mouth at bedtime as needed for insomnia 6)  Proscar 5 Mg Tabs (Finasteride) .Marland Kitchen.. 1 by mouth qd 7)  Omeprazole 20 Mg Cpdr (Omeprazole) .... 2  by mouth daily 8)  Vitamin D3 1000 Unit Tabs (Cholecalciferol) .Marland Kitchen.. 1 by mouth daily 9)  Ecotrin Low Strength 81 Mg Tbec (Aspirin) .... Once daily  Patient Instructions: 1)  Please schedule a follow-up appointment in 3 months. 2)  BMP prior to visit, ICD-9: 3)  Hepatic Panel prior to visit, ICD-9:414.8 596.0 4)  Lipid Panel prior to visit, ICD-9: 5)  TSH prior to visit, ICD-9: 6)  CBC w/ Diff prior to visit, ICD-9: 7)  Urine-dip prior to visit, ICD-9: 8)  PSA prior to visit, ICD-9: Prescriptions: ZOLOFT 50 MG  TABS (SERTRALINE HCL) once daily  #90 x 3   Entered and Authorized by:   Tresa Garter MD   Signed by:   Tresa Garter MD on 07/28/2008   Method used:   Print then Give to Patient   RxID:   [id number] CRESTOR 10 MG  TABS (ROSUVASTATIN CALCIUM) once daily  #90 x 3   Entered and Authorized by:   Tresa Garter MD   Signed by:   Tresa Garter MD on 07/28/2008   Method used:   Print then Give to Patient   RxID:   [id number] TOPROL XL 50 MG  TB24 (METOPROLOL SUCCINATE) once daily  #90 x 3   Entered and Authorized by:   Tresa Garter MD   Signed by:   Tresa Garter MD on 07/28/2008   Method used:   Print then Give to Patient   RxID:   [id number] AMBIEN 10 MG  TABS (ZOLPIDEM TARTRATE) 1 by mouth at bedtime as needed for insomnia  #90 x 1   Entered and Authorized by:   Tresa Garter MD   Signed by:   Tresa Garter MD on 07/28/2008   Method used:   Print then Give to Patient   RxID:   [id number] CRESTOR 10 MG  TABS (ROSUVASTATIN CALCIUM) once daily  #90 x 3   Entered and Authorized by:   Tresa Garter MD   Signed  by:   Tresa Garter MD on 07/28/2008   Method used:   Print then Give to Patient   RxID:   [id number] TOPROL XL 50 MG  TB24 (METOPROLOL SUCCINATE) once daily  #90 x 3   Entered and Authorized by:   Tresa Garter MD   Signed by:   Tresa Garter MD on 07/28/2008   Method used:   Print then Give to Patient   RxID:   [id number] FLOMAX 0.4 MG  CP24 (TAMSULOSIN HCL) once daily  #90 x 3   Entered and Authorized by:  Tresa Garter MD   Signed by:   Tresa Garter MD on 07/28/2008   Method used:   Print then Give to Patient   RxID:   [id number] ZOLOFT 50 MG  TABS (SERTRALINE HCL) once daily  #90 x 3   Entered and Authorized by:   Tresa Garter MD   Signed by:   Tresa Garter MD on 07/28/2008   Method used:   Print then Give to Patient   RxID:   [id number] OMEPRAZOLE 20 MG CPDR (OMEPRAZOLE) 2  by mouth daily  #180 x 3   Entered and Authorized by:   Tresa Garter MD   Signed by:   Tresa Garter MD on 07/28/2008   Method used:   Print then Give to Patient   RxID:   [id number] PROSCAR 5 MG TABS (FINASTERIDE) 1 by mouth qd  #90 x 3   Entered and Authorized by:   Tresa Garter MD   Signed by:   Tresa Garter MD on 07/28/2008   Method used:   Print then Give to Patient   RxID:   [id number]

## 2010-05-31 NOTE — Assessment & Plan Note (Signed)
Summary: 1 MONTH ROV PER MICHELLE/SL   Visit Type:  Follow-up  CC:  sob.  History of Present Illness: This is a 77 year old white male, patient, and wife saw one month ago after undergoing cardiac catheterization at that time. He was still complaining of dyspnea on exertion. We did a d-dimer, which was normal and a CT scan of his chest, which was also normal. We then enrolled him in cardiac rehabilitation, which he just started 2 days ago.  Patient continues to complain of dyspnea on exertion. He says if he bends over to tie his shoe he becomes short of breath. He is under quite a bit of stress at home caring for his wife, who has non-Hodgkin's lymphoma. He has little time for himself and his overall deconditioned from very little physical activity.  Patient also complained of low blood pressure while at cardiac rehabilitation on Monday. He says blood pressure was 100/70. He gets dizzy when he stand up. He has not had any presyncope, or syncope. He also complains of erectile dysfunction.  Current Medications (verified): 1)  Zoloft 50 Mg  Tabs (Sertraline Hcl) .... Once Daily 2)  Flomax 0.4 Mg  Cp24 (Tamsulosin Hcl) .... Once Daily 3)  Toprol Xl 50 Mg  Tb24 (Metoprolol Succinate) .... Once Daily 4)  Crestor 10 Mg  Tabs (Rosuvastatin Calcium) .... Once Daily 5)  Ambien 10 Mg  Tabs (Zolpidem Tartrate) .Marland Kitchen.. 1 By Mouth At Bedtime As Needed For Insomnia 6)  Proscar 5 Mg Tabs (Finasteride) .Marland Kitchen.. 1 By Mouth Qd 7)  Omeprazole 20 Mg Cpdr (Omeprazole) .... 2  By Mouth Daily 8)  Ecotrin Low Strength 81 Mg  Tbec (Aspirin) .... Once Daily 9)  Nitroglycerin 0.4 Mg Subl (Nitroglycerin) .... Place 1 Tablet Under Tongue As Directed  Allergies: 1)  ! Cipro (Ciprofloxacin Hcl) 2)  ! 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  Vital Signs:  Patient profile:   77 year old male Height:      70 inches Weight:      179 pounds Pulse rate:   68 / minute Pulse rhythm:   regular BP sitting:   110 / 70  (left arm)  Vitals Entered By: Jacquelin Hawking, CMA (October 12, 2008 8:23 AM)  Physical Exam  General:   Well-nournished, in no acute distress. Neck: No JVD, HJR, Bruit, or thyroid enlargement Lungs: No tachypnea, clear without wheezing, rales, or rhonchi Cardiovascular: RRR, PMI not displaced, heart sounds normal, no murmurs, gallops, bruit, thrill, or heave. Abdomen: BS normal. Soft without organomegaly, masses, lesions or tenderness. Extremities: without cyanosis, clubbing or edema. Good distal pulses bilateral SKin: Warm, no lesions or rashes  Musculoskeletal: No deformities Neuro: no focal signs    Impression & Recommendations:  Problem # 1:  SHORTNESS OF BREATH (ICD-786.05)  I suspect a lot of this is coming from deconditioning and anxiety, depression. We will order a 2-D echo to rule rule out any other source. Hopefully, cardiac rehabilitation will help him.had recent cardiac catheterization, revealing all grafts were patent.  He did have some competitive flow down the distal LAD, but the disease did not explain the patient's chest pain or symptoms.CT Scan and d-dimer done last month were normal.  His updated medication list for this problem includes:    Toprol Xl 50 Mg Tb24 (Metoprolol succinate) ..... Once daily    Ecotrin Low Strength 81 Mg Tbec (Aspirin)  ..... Once daily  Orders: Echocardiogram (Echo)  Problem # 2:  CORONARY ARTERY DISEASE (ICD-414.00) patient had CABG x4 in 2002. Last cardiac catheterization Sep 04, 2008 revealed grafts to be patent with competitive flow down the distal LAD because of a proximal 70% LAD stenosis and a 99% proximal first diagonal, which was a small vessel and supplies very little territory. Normal LV function His updated medication list for this problem includes:    Toprol Xl 50 Mg Tb24 (Metoprolol succinate) ..... Once daily    Ecotrin Low Strength 81 Mg Tbec (Aspirin) ..... Once daily    Nitroglycerin 0.4 Mg Subl (Nitroglycerin) .Marland Kitchen... Place 1 tablet under tongue as directed  Problem # 3:  ANXIETY DEPRESSION (ICD-300.4) patient is on Zoloft. I asked him to discuss this with his new primary medical doctor  Problem # 4:  ERECTILE DYSFUNCTION, ORGANIC (ICD-607.84) patient is complaining of erectile dysfunction, dizziness, and low blood pressure. We will decrease his Toprol to 25 mg daily to see if this improves  Problem # 5:  HYPOTENSION, UNSPECIFIED (ICD-458.9) patient's blood pressures been running low, associated with some dizziness. Will decrease Toprol to 25 mg daily  Patient Instructions: 1)  Your physician recommends that you schedule a follow-up appointment in: 2 months Dr. Daleen Squibb. 2)  Your physician has requested that you have an echocardiogram.  Echocardiography is a painless test that uses sound waves to create images of your heart. It provides your doctor with information about the size and shape of your heart and how well your heart's chambers and valves are working.  This procedure takes approximately one hour. There are no restrictions for this procedure. 3)  See your urologist & primary care physician 4)  Toprol XL 50mg -take 1/2 daily

## 2010-05-31 NOTE — Assessment & Plan Note (Signed)
Summary: PAIN COMING UP BACK OF LEG/ STARTED TO DAY/ $50 /NWS   Vital Signs:  Patient profile:   77 year old male Height:      70 inches Weight:      187 pounds BMI:     26.93 Temp:     97.5 degrees F oral Pulse rate:   68 / minute BP sitting:   116 / 64  (left arm) Cuff size:   regular  Vitals Entered By: Bill Salinas CMA (August 23, 2008 1:49 PM) CC: pt c/o pain in Left lower leg that started today, pt described pain as stabbing pain and also burning/ ab   CC:  pt c/o pain in Left lower leg that started today and pt described pain as stabbing pain and also burning/ ab.  History of Present Illness: 9 hrs of moderate pain at the posterior aspect of the right leg and the lateral aspect of the left thigh.  no associated numbness.  unable to cite precip factor such a local injury.    Current Medications (verified): 1)  Zoloft 50 Mg  Tabs (Sertraline Hcl) .... Once Daily 2)  Flomax 0.4 Mg  Cp24 (Tamsulosin Hcl) .... Once Daily 3)  Toprol Xl 50 Mg  Tb24 (Metoprolol Succinate) .... Once Daily 4)  Crestor 10 Mg  Tabs (Rosuvastatin Calcium) .... Once Daily 5)  Ambien 10 Mg  Tabs (Zolpidem Tartrate) .Marland Kitchen.. 1 By Mouth At Bedtime As Needed For Insomnia 6)  Proscar 5 Mg Tabs (Finasteride) .Marland Kitchen.. 1 By Mouth Qd 7)  Omeprazole 20 Mg Cpdr (Omeprazole) .... 2  By Mouth Daily 8)  Vitamin D3 1000 Unit  Tabs (Cholecalciferol) .Marland Kitchen.. 1 By Mouth Daily 9)  Ecotrin Low Strength 81 Mg  Tbec (Aspirin) .... Once Daily  Allergies (verified): 1)  ! Cipro (Ciprofloxacin Hcl) 2)  ! * Ivp Dye  Past History:  Past Medical History:    Coronary artery disease    Hypertension     (11/27/2006)  Review of Systems       no change in bowel or bladder function.  Physical Exam  General:  normal appearance.   Pulses:  left dp is intact Extremities:  the lfet leg has no tenderness/swelling/erythema Skin:  no rash on the left leg   Impression & Recommendations:  Problem # 1:  LUMBAR RADICULOPATHY, LEFT  (ICD-724.4) uncertain etiology  Medications Added to Medication List This Visit: 1)  Medrol (pak) 4 Mg Tabs (Methylprednisolone) .... As dir 2)  Vicodin 5-500 Mg Tabs (Hydrocodone-acetaminophen) .Marland Kitchen.. 1 q4h as needed pain  Other Orders: Est. Patient Level III (64403)  Patient Instructions: 1)  medrol dosepack 2)  vicodin 5/500 as needed 3)  call few days if not better, to consider mri spine Prescriptions: VICODIN 5-500 MG TABS (HYDROCODONE-ACETAMINOPHEN) 1 q4h as needed pain  #50 x 1   Entered and Authorized by:   Minus Breeding MD   Signed by:   Minus Breeding MD on 08/23/2008   Method used:   Print then Give to Patient   RxID:   [id number] MEDROL (PAK) 4 MG TABS (METHYLPREDNISOLONE) as dir  #1 pack x 0   Entered and Authorized by:   Minus Breeding MD   Signed by:   Minus Breeding MD on 08/23/2008   Method used:   Electronically to        [facility]* (retail)       [address]       [place], [place]  [id number]       Ph: [phone]       Fax: [phone]   RxID:   [id number]

## 2010-05-31 NOTE — Miscellaneous (Signed)
Summary: [facility]  [facility]   Imported By: Roderic Ovens 12/08/2008 13:44:51  _____________________________________________________________________  External Attachment:    Type:   Image     Comment:   External Document

## 2010-05-31 NOTE — Progress Notes (Signed)
Summary: CARDIAC REHAB REFERRAL  Phone Note Call from Patient Call back at [facility] Phone [phone]   Caller: Patient Reason for Call: Talk to Nurse Complaint: Urinary/GYN Problems Summary of Call: PATIENT WOULD LIKE TO KNOW WHERE THINGS STAND WITH CARDIAC REHAB REFERRAL. Initial call taken by: Burnard Leigh,  Sep 21, 2008 12:06 PM  Follow-up for Phone Call        Left message for cardiac rehab to call back about status of resuming rehab. Julieta Gutting, RN, BSN  Sep 21, 2008 12:42 PM Spoke with Shanda Bumps in Cardiac Rehab and the pt meets the criteria for the maintenance program.  The maintenance program is $68/month out of pocket.  I requested an order be faxed to our office for completion.  I spoke with the pt and made him aware of the cost and he would like to proceed with cardiac rehab.  Will await fax.  Follow-up by: Julieta Gutting, RN, BSN,  Sep 21, 2008 2:05 PM  Additional Follow-up for Phone Call Additional follow up Details #1::        Form completed and faxed. Additional Follow-up by: Bernita Raisin, RN, BSN,  September 28, 2008 4:24 PM

## 2010-05-31 NOTE — Assessment & Plan Note (Signed)
Summary: per check out with pa/saf   Visit Type:  1 yr f/u Primary Provider:  Elon Alas  CC:  pt states he has had some chest discomfort and still has some sob...  History of Present Illness: Troy Combs returns today with continued exertional angina. He states it usually occurs after he's been exercising for 10 or 15 minutes. He is very careful to warmup prior to exercise. He has been monitoring cardiac rehabilitation.  He is undergoing a lot of stress in his life his wife just finishing chemotherapy for cancer. He denies any problems with insomnia, depression though he is on antidepressant, palpitations, but does complain of some dyspnea on exertion.  His last catheterization was in May which showed patent grafts except for a 99% in the ostium of a small diagonal one. He also competitive flow in his LAD with a 70% proximal lesion. We performed a stress Myoview after this showed mild apical ischemia. This points towards the LAD and left internal mammary graft.  Clinical Reports Reviewed:  Cardiac Cath:  09/05/2008: Cardiac Cath Findings:   ASSESSMENT AND PLAN:  This is a 77 year old with a history of coronary   artery disease, status post coronary artery bypass graft who presents   with an episode of chest pain as well as exertional dyspnea.  It was   found that all of his grafts were patent.  He has a vein graft to the   distal right coronary artery,  sequential vein graft to the OM1/OM2, and   a left internal mammary artery to the left anterior descending.  There   is a competitive flow down the distal left anterior descending as the   proximal left anterior descending has only about a 70% stenosis in it.   The native mid right coronary artery, the native first obtuse marginal,   and the native second obtuse marginal are all occluded.  I do not see   disease to explain the Troy Combs's chest pain as there do not appear to be   lesions that would cause significant ischemia.  There was  a 99% stenosis   of proximal first diagonal; however, this vessel is very small and   supplies very little territory.  The Troy Combs also complains of dyspnea   on exertion; however, left ventricular systolic function appears normal   and the left ventricular end-diastolic pressure is normal at 7 mmHg.   Our plan at this time would be medical management of coronary artery   disease.               Marca Ancona, MD    Current Medications (verified): 1)  Zoloft 50 Mg  Tabs (Sertraline Hcl) .... Once Daily 2)  Flomax 0.4 Mg  Cp24 (Tamsulosin Hcl) .... Once Daily 3)  Toprol Xl 50 Mg  Tb24 (Metoprolol Succinate) .... Once Daily 4)  Crestor 10 Mg  Tabs (Rosuvastatin Calcium) .... Once Daily 5)  Ambien 10 Mg  Tabs (Zolpidem Tartrate) .... 1/2 Tab At Bedtime As Needed 6)  Proscar 5 Mg Tabs (Finasteride) .Marland Kitchen.. 1 By Mouth Qd 7)  Omeprazole 20 Mg Cpdr (Omeprazole) .Marland Kitchen.. 1 Tab Once Daily 8)  Nitroglycerin 0.4 Mg Subl (Nitroglycerin) .... Place 1 Tablet Under Tongue As Directed 9)  Aspirin 81 Mg Tbec (Aspirin) .... Take One Tablet By Mouth Daily 10)  Nortriptyline Hcl 10 Mg Caps (Nortriptyline Hcl) .Marland Kitchen.. 1 Tab At Bedtime X 1 Week Then 2 Tabs At Bedtime.Fayne Norrie Not Started Yet 11)  Fioricet [phone] Mg  Tabs (Butalbital-Apap-Caffeine) .Marland Kitchen.. 1 Tab As Needed  Allergies: 1)  ! Cipro (Ciprofloxacin Hcl) 2)  ! 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 Arthrodesis of left long finger distal interphalangeal joint with     Kirschner wire fixation times three.  2. Resection of nail fold mucus cyst.   SURGEON:  Katy Fitch. Sypher, M.D.  Proc. Date: 10/17/00  PROCEDURE:  Coronary artery bypass grafting x 4 with the left internal mammary to the left anterior descending coronary artery, sequential reversed saphenous vein graft to the first and second obtuse marginals, reversed saphenous vein graft to the posterior descending coronary artery.  SURGEON:  Gwenith Daily. Tyrone Sage, M.D.  Family History: Last updated: 09/13/2008 Both of his parents were in their mid to late 80s when   they died.  His father had heart disease, but his mother did not.  No   siblings have heart disease.      Social History: Last updated: 09/13/2008  He lives in [place] with his wife.  He is retired   from [employer].  He quit tobacco greater than 50 years ago and does not   abuse alcohol or drugs.      Risk Factors: Smoking Status: quit (11/27/2006)  Review of Systems       negative other than the history of present illness  Vital Signs:  Troy Combs profile:   77 year old male Height:      70 inches Weight:      180 pounds BMI:     25.92 Pulse rate:   64 / minute Pulse rhythm:   regular BP sitting:   124 / 70  (left arm) Cuff size:   large  Vitals Entered By: Danielle Rankin, CMA (December 12, 2008 1:55 PM)  Physical Exam  General:  Well developed, well nourished, in no acute distress. Head:  normocephalic and atraumatic Eyes:  PERRLA/EOM intact; conjunctiva and lids normal. Mouth:  Teeth, gums and palate normal. Oral mucosa normal. Neck:  Neck supple, no JVD. No masses, thyromegaly or abnormal cervical nodes. Lungs:  Clear bilaterally to auscultation and percussion. Abdomen:  Bowel sounds positive; abdomen soft and non-tender without masses, organomegaly, or hernias noted. No hepatosplenomegaly. Msk:  decreased ROM.  decreased ROM.   Pulses:  pulses  normal in all 4 extremities Extremities:  No clubbing or cyanosis. Neurologic:  Alert and oriented x 3. Skin:  Intact without lesions or rashes.   Impression & Recommendations:  Problem # 1:  CHEST PAIN UNSPECIFIED (ICD-786.50)  The following medications were removed from the medication list:    Ecotrin Low Strength 81 Mg Tbec (Aspirin) ..... Once daily His updated medication list for this problem includes:    Toprol Xl 50 Mg Tb24 (Metoprolol succinate) .Marland Kitchen... Take 1 and 1/2 tablet daily.    Nitroglycerin 0.4 Mg Subl (Nitroglycerin) .Marland Kitchen... Place 1 tablet under tongue as directed    Aspirin 81 Mg Tbec (Aspirin) .Marland Kitchen... Take one tablet by mouth daily    Nitroglycerin 0.4 Mg Subl (Nitroglycerin) ..... One tablet under tongue every 5 minutes as needed for chest pain---may repeat times three  His discomfort sounds as if his exertional angina. Increased his Toprol to 75 mg p.o. q.a.m. He will carry sublingual nitroglycerin he uses p.r.n. We will see him back for close followup in 3 months.  Complete Medication List: 1)  Zoloft 50 Mg Tabs (Sertraline hcl) .... Once daily 2)  Flomax 0.4 Mg Cp24 (Tamsulosin hcl) .... Once daily 3)  Toprol Xl 50 Mg Tb24 (Metoprolol succinate) .... Take 1 and 1/2 tablet daily. 4)  Crestor 10 Mg Tabs (Rosuvastatin calcium) .... Once daily 5)  Ambien 10 Mg Tabs (Zolpidem tartrate) .... 1/2 tab at bedtime as needed 6)  Proscar 5 Mg Tabs (Finasteride) .Marland Kitchen.. 1 by mouth qd 7)  Omeprazole 20 Mg Cpdr (Omeprazole) .Marland Kitchen.. 1 tab once daily 8)  Nitroglycerin 0.4 Mg Subl (Nitroglycerin) .... Place 1 tablet under tongue as directed 9)  Aspirin 81 Mg Tbec (Aspirin) .... Take one tablet by mouth daily 10)  Nortriptyline Hcl 10 Mg Caps (Nortriptyline hcl) .Marland Kitchen.. 1 tab at bedtime x 1 week then 2 tabs at bedtime.Chinonso Aguirre not started yet 11)  Fioricet 50-325-40 Mg Tabs (Butalbital-apap-caffeine) .Marland Kitchen.. 1 tab as needed 12)  Nitroglycerin 0.4 Mg Subl (Nitroglycerin) .... One tablet under  tongue every 5 minutes as needed for chest pain---may repeat times three  Troy Combs Instructions: 1)  Your physician has recommended you make the following change in your medication:  Please Increase Toprol XL to 75 Mg daily. 2)  Your physician recommends that you schedule a follow-up appointment in: 3 Months. 3)  Your physician recommended you take 1 tablet (or 1 spray) under tongue at onset of chest pain; you may repeat every 5 minutes for up to 3 doses. If 3 or more doses are required, call 911 and proceed to the ER immediately. Prescriptions: TOPROL XL 50 MG  TB24 (METOPROLOL SUCCINATE) Take 1 and 1/2 tablet daily.  #135 x 3   Entered by:   Bernita Raisin, RN, BSN   Authorized by:   Gaylord Shih, MD, [facility]   Signed by:   Bernita Raisin, RN, BSN on 12/12/2008   Method used:   Electronically to        [facility]* (mail-order)             ,          Ph: [phone]       Fax: [phone]   RxID:   [id number] NITROGLYCERIN 0.4 MG SUBL (NITROGLYCERIN) One tablet under tongue every 5 minutes as needed for chest pain---may repeat times three  #25 x 7   Entered by:   Bernita Raisin, RN, BSN   Authorized by:   Gaylord Shih, MD, [facility]   Signed by:   Bernita Raisin, RN, BSN on 12/12/2008   Method used:   Electronically to        [facility]* (retail)       [address]       [place], [place]  [id number]       Ph: [phone]       Fax: [phone]   RxID:   [id number]

## 2010-05-31 NOTE — Miscellaneous (Signed)
Summary: [facility]  [facility]   Imported By: Roderic Ovens 12/15/2008 14:21:08  _____________________________________________________________________  External Attachment:    Type:   Image     Comment:   External Document

## 2010-05-31 NOTE — Assessment & Plan Note (Signed)
Summary: eph/. gd   Visit Type:  Follow-up  CC:  sob and "not feeling up to Par".  History of Present Illness: This is a 77 year old white male patient, who recently underwent cardiac catheterization because of recurrent chest pain, and dyspnea on exertion. He has a history of CABG x4 in 2002. Cardiac catheter Sep 04, 2008 revealed all grafts to be patent. His SVG to the distal RCA, SVG to the OM1, and a lump 2, and LIMA to the LAD were all patent. There is competitive flow down the distal LAD has a proximal LAD has only about a 70% stenosis in the period. They did not find a disease to explain the patient's chest pain. There was a 99% proximal first diagonal however, this vessel was very small and supplies very little territory. His LV function and left ventricular end-diastolic pressure were normal. Medical management was recommended.  Since the patient at home. He continues to complain of dyspnea on exertion, and some chest tightness with exertion. He says he only feels about 65% and is not back to normal. He says he gets out of breath with little activity is going to bed at night.  The patient does admit to quite a significant amount of stress in his life. His wife has non-Hodgkin's lymphoma, then he is caring for her. He's had to give up all exercise and working on his home because of caring for her. He gets very little activity and may have some deconditioning. He has not smoked in over 60 years.  Current Medications (verified): 1)  Zoloft 50 Mg  Tabs (Sertraline Hcl) .... Once Daily 2)  Flomax 0.4 Mg  Cp24 (Tamsulosin Hcl) .... Once Daily 3)  Toprol Xl 50 Mg  Tb24 (Metoprolol Succinate) .... Once Daily 4)  Crestor 10 Mg  Tabs (Rosuvastatin Calcium) .... Once Daily 5)  Ambien 10 Mg  Tabs (Zolpidem Tartrate) .Marland Kitchen.. 1 By Mouth At Bedtime As Needed For Insomnia 6)  Proscar 5 Mg Tabs (Finasteride) .Marland Kitchen.. 1 By Mouth Qd 7)  Omeprazole 20 Mg Cpdr (Omeprazole) .... 2  By Mouth Daily 8)  Ecotrin Low  Strength 81 Mg  Tbec (Aspirin) .... Once Daily 9)  Vicodin 5-500 Mg Tabs (Hydrocodone-Acetaminophen) .Marland Kitchen.. 1 Q4h As Needed Pain 10)  Nitroglycerin 0.4 Mg Subl (Nitroglycerin) .... Place 1 Tablet Under Tongue As Directed  Allergies: 1)  ! Cipro (Ciprofloxacin Hcl) 2)  ! 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  He is retired         from [employer].  He quit tobacco greater than 50 years ago and does not         abuse alcohol or drugs.            Review of Systems       see history of present illness. All other  systems negative  Vital Signs:  Patient profile:   77 year old male Height:      70 inches Weight:      183 pounds Pulse rate:   68 / minute Pulse rhythm:   regular BP sitting:   114 / 70  (left arm)  Vitals Entered By: Jacquelin Hawking, CMA (Sep 14, 2008 8:44 AM)  Physical Exam  General:   Well-nournished, in no acute distress. Neck: No JVD, HJR, Bruit, or thyroid enlargement Lungs: No tachypnea, clear without wheezing, rales, or rhonchi Cardiovascular: RRR, PMI not displaced, heart sounds normal, no murmurs, gallops, bruit, thrill, or heave. Abdomen: BS normal. Soft without organomegaly, masses, lesions or tenderness. Extremities: right groin without hematoma or hemorrhage nt cyanosis, clubbing or edema. Good distal pulses bilateral SKin: Warm, no lesions or rashes   Musculoskeletal: No deformities Neuro: no focal signs    EKG  Procedure date:  09/14/2008  Findings:      normal sinus rhythm, no acute change  Impression & Recommendations:  Problem # 1:  SHORTNESS OF BREATH (ICD-786.05) patient continues to have shortness of breath with very little activity, such as walking, and getting ready for bed. We will do a d-dimer and CT of his chest today. If these are normal. He may have deconditioning and stress as causes of his dyspnea on exertion. He is also interested in resuming cardiac rehabilitation to get back on track. His updated medication list for this problem includes:    Toprol Xl 50 Mg Tb24 (Metoprolol succinate) ..... Once daily    Ecotrin Low Strength 81 Mg Tbec (Aspirin) ..... Once daily  Orders: EKG w/ Interpretation (93000) CT Scan  (CT Scan)  Problem # 2:  CORONARY ARTERY DISEASE (ICD-414.00) cardiac catheter revealed all grafts patent, with normal LV function. Please see history of present illness for details and catheter report. The following medications were removed from the medication list:    Nitroglycerin 0.4 Mg Subl (Nitroglycerin) .Marland Kitchen... Place 1 tablet under tongue as directed His updated medication list for this problem includes:    Toprol Xl 50 Mg Tb24 (Metoprolol succinate) ..... Once daily    Ecotrin Low Strength 81 Mg Tbec (Aspirin) ..... Once daily    Nitroglycerin 0.4 Mg Subl (Nitroglycerin) .Marland Kitchen... Place 1 tablet under tongue as directed  Orders: T-D-Dimer Fibrin Derivatives Quantitive [phone]) TLB-BMP (Basic Metabolic Panel-BMET) (80048-METABOL)  Problem # 3:  ANXIETY DEPRESSION (ICD-300.4) patient has a significant amount of anxiety as he is caring for his wife with non-Hodgkin's lymphoma as well as other stresses at home.  Patient Instructions: 1)  Your physician recommends that you schedule a follow-up appointment in: 1 month with Dr. Daleen Squibb 2)  patient will have a d-dimer bmet and a CT of his chest to  rule out PE. If this is negative we will recommend cardiac rehabilitation to increase his exercise tolerance and help decrease the stress in his life. We'll follow up with me in one month. If Dr. Daleen Squibb is unavailable.   Appended Document: Orders Update cardiac rehab    Clinical Lists Changes  Orders: Added new Referral order of Cardiac Rehabilitation (Cardiac Rehab) - Signed

## 2010-05-31 NOTE — Assessment & Plan Note (Signed)
Summary: ROV/ GD   Primary Provider:  Elon Alas  CC:  dizziness last wk.  History of Present Illness: This is a 77 year old white male patient has a history of coronary artery disease status post CABG x4 in 2002. He comes in today because of recurrent dizziness over the past month. He says when he gets up from a lying or sitting position to a standing position and walks across a room he becomes extremely dizzy. He denies any syncope. Approximately 3 months ago his beta blocker was increased because of some exertional angina and shortness of breath with activity. He admits to not drinking a lot of water or fluids. He denies chest pain, palpitations, or significant dyspnea. He says he will get out of breath if he walks up a steep incline, but this has not changed recently.  Problems Prior to Update: 1)  Orthostatic Hypotension  (ICD-458.0) 2)  Encounter For Long-term Use of Other Medications  (ICD-V58.69) 3)  Hyperlipidemia Type Iib / Iii  (ICD-272.2) 4)  Chest Pain Unspecified  (ICD-786.50) 5)  Hypotension, Unspecified  (ICD-458.9) 6)  Dizziness  (ICD-780.4) 7)  Shortness of Breath  (ICD-786.05) 8)  Old Myocardial Infarction  (ICD-412) 9)  Hypertension  (ICD-401.9) 10)  Gastroesophageal Reflux Disease, Hx of  (ICD-V12.79) 11)  Melanoma, Hx of  (ICD-V10.82) 12)  Anxiety Depression  (ICD-300.4) 13)  Unspecified Anemia  (ICD-285.9) 14)  Lumbar Radiculopathy, Left  (ICD-724.4) 15)  Erectile Dysfunction, Organic  (ICD-607.84) 16)  Melanoma, Shoulder  (ICD-172.6) 17)  Obstruction, Bladder Neck  (ICD-596.0) 18)  Coronary Artery Disease  (ICD-414.00)  Current Medications (verified): 1)  Zoloft 50 Mg  Tabs (Sertraline Hcl) .... Once Daily 2)  Flomax 0.4 Mg  Cp24 (Tamsulosin Hcl) .... Once Daily 3)  Toprol Xl 50 Mg  Tb24 (Metoprolol Succinate) .... Take 1 and 1/2 Tablet Daily. 4)  Crestor 10 Mg  Tabs (Rosuvastatin Calcium) .... Once Daily 5)  Ambien 10 Mg  Tabs (Zolpidem Tartrate) .... 1/2  Tab At Bedtime As Needed 6)  Proscar 5 Mg Tabs (Finasteride) .Marland Kitchen.. 1 By Mouth Qd 7)  Omeprazole 20 Mg Cpdr (Omeprazole) .Marland Kitchen.. 1 Tab Once Daily 8)  Nitroglycerin 0.4 Mg Subl (Nitroglycerin) .... Place 1 Tablet Under Tongue As Directed 9)  Aspirin 81 Mg Tbec (Aspirin) .... Take One Tablet By Mouth Daily 10)  Nortriptyline Hcl 10 Mg Caps (Nortriptyline Hcl) .Marland Kitchen.. 1 Tab At Bedtime X 1 Week Then 2 Tabs At Bedtime.Fayne Norrie Not Started Yet 11)  Fioricet 50-325-40 Mg Tabs (Butalbital-Apap-Caffeine) .Marland Kitchen.. 1 Tab As Needed  Allergies: 1)  ! Cipro (Ciprofloxacin Hcl) 2)  ! 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) CORONARY ARTERY DISEASE (ICD-414.00)  Review of Systems       see history of present illness  Vital Signs:  Patient profile:   77 year old male Pulse (ortho):   65 / minute BP standing:   93 / 62  Serial Vital Signs/Assessments:  Time      Position  BP       Pulse  Resp  Temp     By 9:02 AM   Lying LA  108/71   70  Oswald Hillock 9:02 AM   Sitting   124/77   60                    Oswald Hillock 9:02 AM   Standing  93/62    65                    Oswald Hillock  Comments: 9:02 AM 2 minutes- BP-118/81 P 70 3 minutes- BP 137/82 P75 By: Oswald Hillock    Physical Exam  General:   Well-nournished, in no acute distress. Neck: No JVD, HJR, Bruit, or thyroid enlargement Lungs: No tachypnea, clear without wheezing, rales, or rhonchi Cardiovascular: RRR, PMI not displaced, positive S4, and 2/6 systolic murmur at the left border, no  bruit, thrill, or heave. Abdomen: BS normal. Soft without organomegaly, masses, lesions or tenderness. Extremities: without cyanosis, clubbing or edema. Good distal pulses bilateral SKin: Warm, no lesions or rashes  Musculoskeletal: No deformities Neuro: no focal signs    Impression & Recommendations:  Problem # 1:  ORTHOSTATIC HYPOTENSION (ICD-458.0) Patient has a 30 mm Hg drop in his blood pressure from sitting to standing.we will decrease his Toprol to 50 no grams once daily, and hope that he does not have increased in dyspnea on exertion or angina. I've also asked him to become more liberal with his fluid intake, especially water.  Problem # 2:  CORONARY ARTERY DISEASE (ICD-414.00) Patient has history of CABG x4 in 2002. Dr. wall, recently increased his Toprol because of exertional dyspnea and angina. Hopefully, this will not become aggravated with the decreasing his Toprol The following medications were removed from the medication list:    Nitroglycerin 0.4 Mg Subl (Nitroglycerin) ..... One tablet under tongue every 5 minutes as needed for chest pain---may repeat times three His updated medication list for this problem includes:    Toprol Xl 50 Mg Tb24 (Metoprolol succinate) .Marland Kitchen... Take 1  tablet daily.    Nitroglycerin 0.4 Mg Subl (Nitroglycerin) .Marland Kitchen... Place 1 tablet under tongue as directed    Aspirin 81 Mg Tbec (Aspirin) .Marland Kitchen... Take one tablet by mouth daily  Patient Instructions: 1)  Your physician recommends that you schedule a follow-up appointment in: July/2011 with Dr. wall. 2)  Your physician has recommended you make the following change in your medication: Metoprolol 50 mg, take one tablet by mouth once a day.  3)  Take your time when changing position from lying, sitting to  standing . If you continue having dizzy  spells call the office.

## 2010-05-31 NOTE — Letter (Signed)
Summary: [facility]  [facility]   Imported By: Kassie Mends 02/14/2009 11:16:54  _____________________________________________________________________  External Attachment:    Type:   Image     Comment:   External Document

## 2010-06-01 NOTE — Miscellaneous (Signed)
Summary: [facility]  [facility]   Imported By: Roderic Ovens 01/13/2009 13:08:36  _____________________________________________________________________  External Attachment:    Type:   Image     Comment:   External Document

## 2010-07-20 ENCOUNTER — Other Ambulatory Visit: Payer: Self-pay | Admitting: Internal Medicine

## 2010-07-20 LAB — DIFFERENTIAL
Basophils Absolute: 0 10*3/uL (ref 0.0–0.1)
Basophils Relative: 0 % (ref 0–1)
Eosinophils Absolute: 0 10*3/uL (ref 0.0–0.7)
Eosinophils Relative: 0 % (ref 0–5)
Lymphocytes Relative: 2 % — ABNORMAL LOW (ref 12–46)
Lymphs Abs: 0.3 10*3/uL — ABNORMAL LOW (ref 0.7–4.0)
Monocytes Absolute: 0.3 10*3/uL (ref 0.1–1.0)
Monocytes Relative: 2 % — ABNORMAL LOW (ref 3–12)
Neutro Abs: 15.8 10*3/uL — ABNORMAL HIGH (ref 1.7–7.7)
Neutrophils Relative %: 97 % — ABNORMAL HIGH (ref 43–77)

## 2010-07-20 LAB — BASIC METABOLIC PANEL
BUN: 28 mg/dL — ABNORMAL HIGH (ref 6–23)
CO2: 24 mEq/L (ref 19–32)
Calcium: 9.2 mg/dL (ref 8.4–10.5)
Chloride: 106 mEq/L (ref 96–112)
Creatinine, Ser: 1.04 mg/dL (ref 0.4–1.5)
GFR calc Af Amer: >60 mL/min (ref >60)
GFR calc non Af Amer: >60 mL/min (ref >60)
Glucose, Bld: 191 mg/dL — ABNORMAL HIGH (ref 70–99)
Potassium: 4.2 mEq/L (ref 3.5–5.1)
Sodium: 140 mEq/L (ref 135–145)

## 2010-07-20 LAB — CBC
HCT: 48.5 % (ref 39.0–52.0)
Hemoglobin: 16.6 g/dL (ref 13.0–17.0)
MCHC: 34.3 g/dL (ref 30.0–36.0)
MCV: 89.7 fL (ref 78.0–100.0)
Platelets: 182 10*3/uL (ref 150–400)
RBC: 5.41 MIL/uL (ref 4.22–5.81)
RDW: 13.6 % (ref 11.5–15.5)
WBC: 16.4 10*3/uL — ABNORMAL HIGH (ref 4.0–10.5)

## 2010-08-07 LAB — DIFFERENTIAL
Basophils Absolute: 0 10*3/uL (ref 0.0–0.1)
Basophils Relative: 0 % (ref 0–1)
Eosinophils Absolute: 0.3 10*3/uL (ref 0.0–0.7)
Eosinophils Relative: 4 % (ref 0–5)
Lymphocytes Relative: 12 % (ref 12–46)
Lymphs Abs: 1 10*3/uL (ref 0.7–4.0)
Monocytes Absolute: 0.8 10*3/uL (ref 0.1–1.0)
Monocytes Relative: 10 % (ref 3–12)
Neutro Abs: 6.2 10*3/uL (ref 1.7–7.7)
Neutrophils Relative %: 74 % (ref 43–77)

## 2010-08-07 LAB — CBC
HCT: 43 % (ref 39.0–52.0)
Hemoglobin: 15 g/dL (ref 13.0–17.0)
MCHC: 34.8 g/dL (ref 30.0–36.0)
MCV: 89 fL (ref 78.0–100.0)
Platelets: 133 10*3/uL — ABNORMAL LOW (ref 150–400)
RBC: 4.83 MIL/uL (ref 4.22–5.81)
RDW: 13.4 % (ref 11.5–15.5)
WBC: 8.4 10*3/uL (ref 4.0–10.5)

## 2010-08-07 LAB — CARDIAC PANEL(CRET KIN+CKTOT+MB+TROPI)
CK, MB: 1.5 ng/mL (ref 0.3–4.0)
CK, MB: 1.5 ng/mL (ref 0.3–4.0)
CK, MB: 1.6 ng/mL (ref 0.3–4.0)
Relative Index: INVALID (ref 0.0–2.5)
Relative Index: INVALID (ref 0.0–2.5)
Relative Index: INVALID (ref 0.0–2.5)
Total CK: 49 U/L (ref 7–232)
Total CK: 50 U/L (ref 7–232)
Total CK: 51 U/L (ref 7–232)
Troponin I: 0.01 ng/mL (ref 0.00–0.06)
Troponin I: 0.01 ng/mL (ref 0.00–0.06)
Troponin I: 0.02 ng/mL (ref 0.00–0.06)

## 2010-08-07 LAB — PROTIME-INR
INR: 1.1 (ref 0.00–1.49)
INR: 1.2 (ref 0.00–1.49)
Prothrombin Time: 14.1 seconds (ref 11.6–15.2)
Prothrombin Time: 15.4 seconds — ABNORMAL HIGH (ref 11.6–15.2)

## 2010-08-07 LAB — LIPID PANEL
Cholesterol: 119 mg/dL (ref 0–200)
HDL: 42 mg/dL (ref >39)
LDL Cholesterol: 67 mg/dL (ref 0–99)
Total CHOL/HDL Ratio: 2.8 RATIO
Triglycerides: 51 mg/dL (ref <150)
VLDL: 10 mg/dL (ref 0–40)

## 2010-08-07 LAB — BASIC METABOLIC PANEL
BUN: 20 mg/dL (ref 6–23)
CO2: 28 mEq/L (ref 19–32)
Calcium: 8.9 mg/dL (ref 8.4–10.5)
Chloride: 109 mEq/L (ref 96–112)
Creatinine, Ser: 0.91 mg/dL (ref 0.4–1.5)
GFR calc Af Amer: >60 mL/min (ref >60)
GFR calc non Af Amer: >60 mL/min (ref >60)
Glucose, Bld: 122 mg/dL — ABNORMAL HIGH (ref 70–99)
Potassium: 4.7 mEq/L (ref 3.5–5.1)
Sodium: 141 mEq/L (ref 135–145)

## 2010-08-07 LAB — URINALYSIS, ROUTINE W REFLEX MICROSCOPIC
Bilirubin Urine: NEGATIVE
Glucose, UA: NEGATIVE mg/dL
Hgb urine dipstick: NEGATIVE
Ketones, ur: NEGATIVE mg/dL
Nitrite: NEGATIVE
Protein, ur: NEGATIVE mg/dL
Specific Gravity, Urine: 1.026 (ref 1.005–1.030)
Urobilinogen, UA: 1 mg/dL (ref 0.0–1.0)
pH: 6 (ref 5.0–8.0)

## 2010-08-07 LAB — COMPREHENSIVE METABOLIC PANEL
ALT: 34 U/L (ref 0–53)
AST: 28 U/L (ref 0–37)
Albumin: 3.5 g/dL (ref 3.5–5.2)
Alkaline Phosphatase: 45 U/L (ref 39–117)
BUN: 16 mg/dL (ref 6–23)
CO2: 24 mEq/L (ref 19–32)
Calcium: 9.2 mg/dL (ref 8.4–10.5)
Chloride: 107 mEq/L (ref 96–112)
Creatinine, Ser: 0.82 mg/dL (ref 0.4–1.5)
GFR calc Af Amer: >60 mL/min (ref >60)
GFR calc non Af Amer: >60 mL/min (ref >60)
Glucose, Bld: 178 mg/dL — ABNORMAL HIGH (ref 70–99)
Potassium: 4.5 mEq/L (ref 3.5–5.1)
Sodium: 138 mEq/L (ref 135–145)
Total Bilirubin: 0.9 mg/dL (ref 0.3–1.2)
Total Protein: 6 g/dL (ref 6.0–8.3)

## 2010-08-07 LAB — TSH: TSH: 0.542 u[IU]/mL (ref 0.350–4.500)

## 2010-08-07 LAB — HEPARIN LEVEL (UNFRACTIONATED)
Heparin Unfractionated: 0.78 IU/mL — ABNORMAL HIGH (ref 0.30–0.70)
Heparin Unfractionated: 1.02 IU/mL — ABNORMAL HIGH (ref 0.30–0.70)

## 2010-08-07 LAB — POCT CARDIAC MARKERS
CKMB, poc: <1 ng/mL — ABNORMAL LOW (ref 1.0–8.0)
Myoglobin, poc: 40.4 ng/mL (ref 12–200)
Troponin i, poc: <0.05 ng/mL (ref 0.00–0.09)

## 2010-08-07 LAB — APTT
aPTT: 31 seconds (ref 24–37)
aPTT: >200 seconds (ref 24–37)

## 2010-08-07 LAB — TROPONIN I: Troponin I: 0.01 ng/mL (ref 0.00–0.06)

## 2010-08-07 LAB — CK TOTAL AND CKMB (NOT AT ARMC)
CK, MB: 1.5 ng/mL (ref 0.3–4.0)
Relative Index: INVALID (ref 0.0–2.5)
Total CK: 52 U/L (ref 7–232)

## 2010-08-07 LAB — HEMOGLOBIN A1C
Hgb A1c MFr Bld: 5.8 % (ref 4.6–6.1)
Mean Plasma Glucose: 120 mg/dL

## 2010-08-13 ENCOUNTER — Ambulatory Visit (INDEPENDENT_AMBULATORY_CARE_PROVIDER_SITE_OTHER): Payer: 59 | Admitting: Internal Medicine

## 2010-08-13 ENCOUNTER — Other Ambulatory Visit (INDEPENDENT_AMBULATORY_CARE_PROVIDER_SITE_OTHER): Payer: MEDICARE

## 2010-08-13 ENCOUNTER — Ambulatory Visit (INDEPENDENT_AMBULATORY_CARE_PROVIDER_SITE_OTHER)
Admission: RE | Admit: 2010-08-13 | Discharge: 2010-08-13 | Disposition: A | Discharge status: Home / Self Care | Payer: 59 | Source: Ambulatory Visit | Attending: Internal Medicine | Admitting: Internal Medicine

## 2010-08-13 ENCOUNTER — Encounter: Payer: Self-pay | Admitting: Internal Medicine

## 2010-08-13 DIAGNOSIS — R0600 Dyspnea, unspecified: Secondary | ICD-10-CM

## 2010-08-13 DIAGNOSIS — R202 Paresthesia of skin: Secondary | ICD-10-CM

## 2010-08-13 DIAGNOSIS — I2510 Atherosclerotic heart disease of native coronary artery without angina pectoris: Secondary | ICD-10-CM

## 2010-08-13 DIAGNOSIS — R0989 Other specified symptoms and signs involving the circulatory and respiratory systems: Secondary | ICD-10-CM

## 2010-08-13 DIAGNOSIS — R0609 Other forms of dyspnea: Secondary | ICD-10-CM

## 2010-08-13 DIAGNOSIS — R209 Unspecified disturbances of skin sensation: Secondary | ICD-10-CM

## 2010-08-13 LAB — COMPREHENSIVE METABOLIC PANEL
ALT: 29 U/L (ref 0–53)
AST: 23 U/L (ref 0–37)
Albumin: 4.3 g/dL (ref 3.5–5.2)
Alkaline Phosphatase: 45 U/L (ref 39–117)
BUN: 17 mg/dL (ref 6–23)
CO2: 30 mEq/L (ref 19–32)
Calcium: 9.5 mg/dL (ref 8.4–10.5)
Chloride: 103 mEq/L (ref 96–112)
Creatinine, Ser: 0.9 mg/dL (ref 0.4–1.5)
GFR: 83.8 mL/min (ref >60.00)
Glucose, Bld: 100 mg/dL — ABNORMAL HIGH (ref 70–99)
Potassium: 4.7 mEq/L (ref 3.5–5.1)
Sodium: 140 mEq/L (ref 135–145)
Total Bilirubin: 1.1 mg/dL (ref 0.3–1.2)
Total Protein: 7 g/dL (ref 6.0–8.3)

## 2010-08-13 LAB — URINALYSIS
Bilirubin Urine: NEGATIVE
Hgb urine dipstick: NEGATIVE
Ketones, ur: NEGATIVE
Leukocytes, UA: NEGATIVE
Nitrite: NEGATIVE
Specific Gravity, Urine: 1.02 (ref 1.000–1.030)
Total Protein, Urine: NEGATIVE
Urine Glucose: NEGATIVE
Urobilinogen, UA: 0.2 (ref 0.0–1.0)
pH: 6 (ref 5.0–8.0)

## 2010-08-13 LAB — TSH: TSH: 1.46 u[IU]/mL (ref 0.35–5.50)

## 2010-08-13 LAB — SEDIMENTATION RATE: Sed Rate: 6 mm/hr (ref 0–22)

## 2010-08-13 LAB — CK: Total CK: 155 U/L (ref 7–232)

## 2010-08-13 LAB — BRAIN NATRIURETIC PEPTIDE: Pro B Natriuretic peptide (BNP): 85.3 pg/mL (ref 0.0–100.0)

## 2010-08-13 LAB — VITAMIN B12: Vitamin B-12: 269 pg/mL (ref 211–911)

## 2010-08-13 MED ORDER — BUDESONIDE-FORMOTEROL FUMARATE 160-4.5 MCG/ACT IN AERO: 2.0000 | INHALATION_SPRAY | Freq: Two times a day (BID) | RESPIRATORY_TRACT | Status: DC

## 2010-08-13 NOTE — Progress Notes (Signed)
  Subjective:    Patient ID: Troy Combs, male    DOB: 03-07-1932, 77 y.o.   MRN: [id number]  HPI  C/o SOB x 12 mo or so with exertion No orthopnea. No cough. No CP. No wheezing.  Review of Systems  Constitutional: Positive for fatigue. Negative for fever, chills, appetite change and unexpected weight change.  HENT: Negative for ear pain, nosebleeds and congestion.   Eyes: Negative for pain.  Respiratory: Positive for shortness of breath. Negative for cough, choking, wheezing and stridor.   Cardiovascular: Negative for chest pain, palpitations and leg swelling.  Gastrointestinal: Positive for nausea. Negative for abdominal distention.  Genitourinary: Negative for urgency and hematuria.  Musculoskeletal: Negative for myalgias and back pain.  Neurological: Negative for tremors and weakness.  Psychiatric/Behavioral: Negative for suicidal ideas, behavioral problems and confusion.       Objective:   Physical Exam  Constitutional: He is oriented to person, place, and time. He appears well-developed.  HENT:  Mouth/Throat: Oropharynx is clear and moist.  Eyes: Conjunctivae are normal. Pupils are equal, round, and reactive to light.  Neck: Normal range of motion. No JVD present. No thyromegaly present.  Cardiovascular: Normal rate, regular rhythm, normal heart sounds and intact distal pulses.  Exam reveals no gallop and no friction rub.   No murmur heard. Pulmonary/Chest: Effort normal and breath sounds normal. No respiratory distress. He has no wheezes. He has no rales. He exhibits no tenderness.  Abdominal: Soft. Bowel sounds are normal. He exhibits no distension and no mass. There is no tenderness. There is no rebound and no guarding.  Musculoskeletal: Normal range of motion. He exhibits no edema and no tenderness.  Lymphadenopathy:    He has no cervical adenopathy.  Neurological: He is alert and oriented to person, place, and time. He has normal reflexes. No cranial nerve deficit.  He exhibits normal muscle tone. Coordination normal.  Skin: Skin is warm and dry. No rash noted.  Psychiatric: He has a normal mood and affect. His behavior is normal. Judgment and thought content normal.          Assessment & Plan:  Dyspnea - ? etiology Diff. Dx is broad Ordered: CXR EKG ECHO Labs  CAD - On Rx  Total time over 45 mins > 50% spent counseling patient with regards to the problems above, coordination of care and treatment options.

## 2010-08-14 ENCOUNTER — Other Ambulatory Visit (HOSPITAL_COMMUNITY): Payer: Self-pay | Admitting: Internal Medicine

## 2010-08-14 DIAGNOSIS — R0600 Dyspnea, unspecified: Secondary | ICD-10-CM

## 2010-08-16 ENCOUNTER — Ambulatory Visit (HOSPITAL_COMMUNITY): Payer: MEDICARE | Attending: Internal Medicine

## 2010-08-16 DIAGNOSIS — R0602 Shortness of breath: Secondary | ICD-10-CM

## 2010-08-16 DIAGNOSIS — I2510 Atherosclerotic heart disease of native coronary artery without angina pectoris: Secondary | ICD-10-CM | POA: Insufficient documentation

## 2010-08-16 DIAGNOSIS — R0989 Other specified symptoms and signs involving the circulatory and respiratory systems: Secondary | ICD-10-CM | POA: Insufficient documentation

## 2010-08-16 DIAGNOSIS — I080 Rheumatic disorders of both mitral and aortic valves: Secondary | ICD-10-CM | POA: Insufficient documentation

## 2010-08-16 DIAGNOSIS — I079 Rheumatic tricuspid valve disease, unspecified: Secondary | ICD-10-CM | POA: Insufficient documentation

## 2010-08-16 DIAGNOSIS — I379 Nonrheumatic pulmonary valve disorder, unspecified: Secondary | ICD-10-CM | POA: Insufficient documentation

## 2010-08-16 DIAGNOSIS — R0609 Other forms of dyspnea: Secondary | ICD-10-CM | POA: Insufficient documentation

## 2010-08-16 DIAGNOSIS — R0600 Dyspnea, unspecified: Secondary | ICD-10-CM

## 2010-08-17 ENCOUNTER — Telehealth: Payer: Self-pay | Admitting: Internal Medicine

## 2010-08-17 NOTE — Telephone Encounter (Signed)
Pt informed

## 2010-08-17 NOTE — Telephone Encounter (Signed)
Troy Combs, please, inform patient that ECHO was not bad! Keep ROV

## 2010-08-30 ENCOUNTER — Encounter: Payer: Self-pay | Admitting: Internal Medicine

## 2010-08-30 ENCOUNTER — Ambulatory Visit (INDEPENDENT_AMBULATORY_CARE_PROVIDER_SITE_OTHER): Payer: 59 | Admitting: Internal Medicine

## 2010-08-30 DIAGNOSIS — R0600 Dyspnea, unspecified: Secondary | ICD-10-CM

## 2010-08-30 DIAGNOSIS — E538 Deficiency of other specified B group vitamins: Secondary | ICD-10-CM

## 2010-08-30 DIAGNOSIS — R0989 Other specified symptoms and signs involving the circulatory and respiratory systems: Secondary | ICD-10-CM

## 2010-08-30 DIAGNOSIS — I2510 Atherosclerotic heart disease of native coronary artery without angina pectoris: Secondary | ICD-10-CM

## 2010-08-30 DIAGNOSIS — H9190 Unspecified hearing loss, unspecified ear: Secondary | ICD-10-CM | POA: Insufficient documentation

## 2010-08-30 DIAGNOSIS — R0602 Shortness of breath: Secondary | ICD-10-CM

## 2010-08-30 DIAGNOSIS — H9319 Tinnitus, unspecified ear: Secondary | ICD-10-CM | POA: Insufficient documentation

## 2010-08-30 DIAGNOSIS — R0609 Other forms of dyspnea: Secondary | ICD-10-CM

## 2010-08-30 DIAGNOSIS — I10 Essential (primary) hypertension: Secondary | ICD-10-CM

## 2010-08-30 MED ORDER — VITAMIN B-12 1000 MCG PO TABS: 1000.0000 ug | ORAL_TABLET | Freq: Every day | ORAL | Status: AC

## 2010-08-30 MED ORDER — CYANOCOBALAMIN 1000 MCG/ML IJ SOLN
1000.0000 ug | Freq: Once | INTRAMUSCULAR | Status: AC
  Administered 2010-08-30: 1000 ug via INTRAMUSCULAR

## 2010-08-30 MED ORDER — ROSUVASTATIN CALCIUM 10 MG PO TABS: 10.0000 mg | ORAL_TABLET | Freq: Every day | ORAL | Status: DC

## 2010-08-30 NOTE — Assessment & Plan Note (Signed)
On Rx 

## 2010-08-30 NOTE — Assessment & Plan Note (Signed)
Start B12

## 2010-08-30 NOTE — Assessment & Plan Note (Signed)
Dr Rosen 

## 2010-08-30 NOTE — Assessment & Plan Note (Signed)
DOE. Poss deconditioning. Start Card rehab. F/u w/Dr Wall. Labs, CXR and ECHO reviewed.

## 2010-08-30 NOTE — Progress Notes (Signed)
  Subjective:    Patient ID: Troy Combs, male    DOB: 06/20/1931, 77 y.o.   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 C/o hearing loss and ringing in ears x long time. F/u on tests  Review of Systems  Constitutional: Negative for chills.  HENT: Negative for nosebleeds.   Respiratory: Positive for shortness of breath. Negative for choking and wheezing.   Cardiovascular: Negative for chest pain.  Gastrointestinal: Negative for abdominal distention.  Genitourinary: Negative for dysuria and penile swelling.  Musculoskeletal: Negative for joint swelling.  Hematological: Negative for adenopathy.  Psychiatric/Behavioral: Negative for agitation.       Objective:   Physical Exam  Constitutional: He appears well-developed. No distress.  Eyes: Pupils are equal, round, and reactive to light. Right eye exhibits no discharge. Left eye exhibits no discharge.  Neck: No JVD present. No thyromegaly present.  Cardiovascular: Exam reveals no gallop and no friction rub.   No murmur heard. Pulmonary/Chest: No stridor. No respiratory distress. He has no rales.  Abdominal: There is no tenderness. There is no guarding.  Musculoskeletal: Normal range of motion.  Lymphadenopathy:    He has no cervical adenopathy.  Skin: No rash noted. He is not diaphoretic. No erythema.   Lab Results  Component Value Date   WBC 16.4* 06/11/2009   HGB 16.6 06/11/2009   HCT 48.5 06/11/2009   PLT 182 06/11/2009   CHOL 111 05/15/2009   TRIG 98.0 05/15/2009   HDL 43.60 05/15/2009   ALT 29 08/13/2010   AST 23 08/13/2010   NA 140 08/13/2010   K 4.7 08/13/2010   CL 103 08/13/2010   CREATININE 0.9 08/13/2010   BUN 17 08/13/2010   CO2 30 08/13/2010   TSH 1.46 08/13/2010   PSA 1.75 03/18/2006   INR 1.2 09/04/2008   HGBA1C  Value: 5.8 (NOTE) The ADA recommends the following therapeutic goal for glycemic control related to Hgb A1c measurement: Goal of therapy: <6.5 Hgb  A1c  Reference: American Diabetes Association: Clinical Practice Recommendations 2010, Diabetes Care, 2010, 33: (Suppl  1). 09/04/2008          Assessment & Plan:  B12 deficiency Start B12  SHORTNESS OF BREATH DOE. Poss deconditioning. Start Card rehab. F/u w/Dr Wall. Labs, CXR and ECHO reviewed.  HYPERTENSION On Rx  CORONARY ARTERY DISEASE On Rx  Hearing loss Dr Pollyann Kennedy  Tinnitus Dr Pollyann Kennedy    Complex case

## 2010-09-11 NOTE — Assessment & Plan Note (Signed)
[facility]                            CARDIOLOGY OFFICE NOTE   ANKUSH, GINTZ                    MRN:          [id number]  DATE:09/30/2006                            DOB:          04-03-1932    Mr. Compston returns today for further management of the following  issues:  1. Coronary artery disease. He is status post coronary artery bypass      grafting in June of 2002. He has had some dyspnea on exertion      particularly when climbing hills and doing manual work. Otherwise,      he has had no ischemic symptoms. This has been fairly stable. His      last Myoview December 16, 2005, showed an EF of 67% with no ischemia.  2. Hyperlipidemia. He has had a brilliant response to Pravachol 20.      His lipids are at goal except for a low HDL of 37.8 as of November      2007.   MEDICATIONS:  1. Nexium 40 mg a day.  2. Zoloft 50 mg a day.  3. Ecotrin 81 mg a day.  4. Flomax 0.4 mg a day.  5. Toprol XL 50 mg a day.  6. Pravachol 20 mg a day.   His blood pressure is 171/95. I rechecked and it was 148/about 80. He  says it is usually around 120/80, which it has been in the office in the  past. His heart rate is 65 and regular. His weight is down three pounds  to 179.  HEENT: He is fairly plethoric. Facial symmetry is normal. Extra-ocular  movements intact. Sclerae are clear. NECK: Supple. Carotid upstrokes are  equal bilaterally without bruits. There is no JVD.  Thyroid is not  enlarged. Trachea is midline.  LUNGS:  Are clear.  HEART: Reveals a regular rate and rhythm. There is no gallop, rub, or  murmur.  ABDOMEN: Soft with good bowel sounds.  EXTREMITIES: Reveals no cyanosis, clubbing or edema. Pulses are intact.  NEURO: Intact.   ASSESSMENT/PLAN:  Mr. Luckett is doing well. I have made no changes in  his program. Will see him back in a year. At that time, will schedule  him for an exercise rest/stress Myoview.     Thomas C. Daleen Squibb, MD, [facility]  Electronically Signed    TCW/MedQ  DD: 09/30/2006  DT: 09/30/2006  Job #: [id number]   cc:   Georgina Quint. Plotnikov, MD

## 2010-09-11 NOTE — Assessment & Plan Note (Signed)
[facility]                            CARDIOLOGY OFFICE NOTE   CONSUELO, THAYNE                    MRN:          [id number]  DATE:02/24/2008                            DOB:          October 02, 1931    Mr. Haubner returns today for followup.   PROBLEM LIST:  1. Coronary artery disease.  He had bypass grafting in 2002.  He is      currently having no ischemic symptoms.  His last Myoview was in      December 16, 2005, ejection fraction 60% with no ischemia.  2. Hyperlipidemia.  He has done very well on a statin.  He is now on      Crestor rather than Pravachol.  He is due lipids, which he has not      obtained.   MEDICATIONS:  1. He is currently on Nexium 40 mg a day.  2. Zoloft 50 mg a day.  3. Ecotrin 81 mg a day.  4. Flomax 0.4 mg per day.  5. Toprol-XL 50 mg a day.  6. Crestor 10 mg a day.  7. Foltx daily.  8. Avodart 0.5 mg per day.  9. Sublingual nitroglycerin p.r.n.   PHYSICAL EXAMINATION:  VITAL SIGNS:  Blood pressure is 126/80, his pulse  is 60 and regular, and his weight is 179.  HEENT:  No change.  NECK:  Carotid upstrokes were equal bilateral without bruits.  No JVD.  Thyroid is not enlarged.  Trachea is midline.  LUNGS:  Clear to auscultation and percussion.  HEART:  Reveals a normal nondisplaced PMI.  Normal S1 and S2 without  murmur, rub, or gallop.  ABDOMEN:  Soft, good bowel sounds.  No midline bruit.  No hepatomegaly.  EXTREMITIES:  No cyanosis, clubbing, or edema.  Pulses are intact.  NEUROLOGIC:  Intact.   EKG is normal.   ASSESSMENT AND PLAN:  Mr. Meek is doing well.  He is due an adenosine  Myoview as well as lipids and a CMP.  Assuming these are negative and  unremarkable, we will see him back in a year.   He is written a book entitled a novel O'Reiley's Alan.  He has given  me a copy today.  I am very excited about the opportunity to read it.     Thomas C. Daleen Squibb, MD, [facility]  Electronically Signed    TCW/MedQ  DD: 02/24/2008  DT: 02/25/2008  Job #: [id number]

## 2010-09-11 NOTE — Discharge Summary (Signed)
NAME:  Troy Combs, Troy Combs NO.:  [id number]   MEDICAL RECORD NO.:  [id number]          PATIENT TYPE:  INP   LOCATION:  3705                         FACILITY:  [facility]   PHYSICIAN:  Marca Ancona, MD      DATE OF BIRTH:  27-Jun-1931   DATE OF ADMISSION:  09/04/2008  DATE OF DISCHARGE:  09/05/2008                               DISCHARGE SUMMARY   PRIMARY CARDIOLOGY:  Dr. Valera Castle.   PRIMARY CARE Benedicta Sultan:  Dr. Georgina Quint. Plotnikov, MD.   DISCHARGE DIAGNOSES:  Chest pain.   SECONDARY DIAGNOSES:  1. Coronary artery disease, status post CABG x4 in 2002.  2. Hypertension.  3. Hyperlipidemia.  4. History of atrial flutter status post radiofrequency ablation.  5. GERD.  6. History of anemia.  7. Anxiety and depression.  8. History of melanoma.  9. Remote tobacco abuse.   ALLERGIES:  IV contrast, iodine, and Cipro.   PROCEDURES:  Left heart cardiac catheterization revealing 4 of 4 patent  grafts with native multivessel coronary artery disease, EF of 60%  without regional wall motion abnormalities.   HISTORY OF PRESENT ILLNESS:  A 77 year old Caucasian male with the above  problem list.  He was in his usual state of health until recently when  he began to experience progressive dyspnea on exertion and decreased  exercise tolerance.  On the day of admission, he was sitting in church,  had a sudden onset of substernal chest discomfort and squeezing,  radiating across his chest associated with mild dyspnea.  EMS was  called.  The patient was taken to the [facility] ED.  Once in the ED,  his point-of-care markers were negative, and ECG showed no acute  changes.  He was admitted for further evaluation.   HOSPITAL COURSE:  The patient was ruled out for MI.  He had no further  chest discomfort and underwent left heart cardiac catheterization this  morning revealing 4 of 4 patent grafts with significant multivessel  coronary artery disease.  There were no targets for  intervention.  His  EF was 60% without regional wall motion abnormalities.  It was felt that  he should be managed medically.  Postprocedure, he has been ambulating  without difficulty and will be discharged home this evening in good  condition.   DISCHARGE LABS:  Hemoglobin 15.0, hematocrit 43.0, WBC 8.4, platelets  133, INR 1.2, sodium 138, potassium 4.5, chloride 107, CO2 24, BUN 16,  creatinine 22, glucose 178, total bilirubin 0.9, alkaline phosphatase  45, AST 28, ALT 34, total protein 6.0, albumin 3.5, calcium 9.2,  hemoglobin A1c 5.8.  CK 49, MB 1.5, troponin I 0.01, total cholesterol  119, triglyceride 51, HDL 42, LDL 67, TSH 0.542, urinalysis is negative.   DISPOSITION:  The patient is being discharged home today in good  condition.   FOLLOWUP PLANS AND APPOINTMENT:  We have arranged for him to follow up  with Dr. Vern Claude nurse practitioner on May 27 at 1.45 p.m.  He is asked  to follow up with Dr. Posey Rea as previously scheduled.   DISCHARGE MEDICATIONS:  1. Aspirin 81 mg  daily.  2. Avodart 0.5 mg daily.  3. Crestor 10 mg daily.  4. Flomax 0.4 mg daily.  5. Toprol-XL 50 mg daily.  6. Nexium 40 mg daily.  7. Sertraline 50 mg daily.  8. Zolpidem 5mg  at bedtime p.r.n.  9. Nitroglycerin 0.4 mg sublingual p.r.n. chest pain.   OUTSTANDING LABS AND STUDIES:  None.   DURATION OF DISCHARGE/ENCOUNTER:  35 minutes including physician time.      Nicolasa Ducking, ANP      Marca Ancona, MD  Electronically Signed    CB/MEDQ  D:  09/05/2008  T:  09/06/2008  Job:  [id number]   cc:   Georgina Quint. Plotnikov, MD

## 2010-09-11 NOTE — Cardiovascular Report (Signed)
NAME:  Troy Combs, Troy Combs NO.:  [id number]   MEDICAL RECORD NO.:  [id number]          PATIENT TYPE:  INP   LOCATION:  3705                         FACILITY:  [facility]   PHYSICIAN:  Marca Ancona, MD      DATE OF BIRTH:  1932/02/21   DATE OF PROCEDURE:  DATE OF DISCHARGE:  09/05/2008                            CARDIAC CATHETERIZATION   PROCEDURE:  1. Left heart catheterization.  2. Coronary angiography.  3. Left ventriculography.  4. Saphenous vein graft angiography.  5. Left internal mammary artery angiography.   INDICATION:  Chest pain and dyspnea on exertion in a patient with a  history of coronary artery disease, status post coronary artery bypass  grafting in 2002.   PROCEDURE NOTE:  After informed consent was obtained, the right groin  was sterilely prepped and draped.  A 1% lidocaine was used to locally  anesthetize the right groin area.  The right common femoral artery was  entered using Seldinger technique and a 6-French arterial sheath was  placed in the artery.  The RCA was engaged using the 6-French  multipurpose catheter.  The left ventricle was entered using a 6-French  multipurpose catheter.  The saphenous vein graft to the distal RCA was  engaged using a 6-French multipurpose catheter.  The native left  coronary artery was engaged using the 6-French JL4 catheter.  The  sequential saphenous vein graft to the first and second obtuse marginal  was engaged using the 6-French JR4 catheter and the LIMA was engaged  using the 6-French LIMA catheter.   FINDINGS:  1. Hemodynamics.  Aorta 118/64, LV 118/7.  2. Left ventriculography.  EF was 60%.  There were no wall motion      abnormalities.  3. Coronary angiography.  The coronary system was right dominant.      There is a 95% proximal RCA stenosis.  The RCA then supplies an      acute marginal and following that there is a total occlusion at the      mid RCA.  There is a patent vein graft to the distal RCA  with some      luminal irregularities throughout the body of the vein graft.  This      supplies the PLV and the PDA.  The left main has no significant      disease.  The AV circumflex is patent; however, the first and      second obtuse marginals are totally occluded proximally.  There is      a patent sequential saphenous vein graft to the first and second      obtuse marginals with good distal run off in both vessels.  The      proximal native LAD has a long up to 70% stenosis.  There is a      small first diagonal with a 99% ostial stenosis.  There is a      moderate second diagonal with a 40% ostial stenosis.  The LIMA to      the LAD is patent with good touchdown and distal run off.  There is  competitive flow in the body of the LAD as the distal LAD is      supplied both by the native proximal LAD and the LIMA.  Of note,      the left subclavian is a very tortuous vessel; however, there      appears to be no significant stenosis.   ASSESSMENT AND PLAN:  This is a 77 year old with a history of coronary  artery disease, status post coronary artery bypass graft who presents  with an episode of chest pain as well as exertional dyspnea.  It was  found that all of his grafts were patent.  He has a vein graft to the  distal right coronary artery,  sequential vein graft to the OM1/OM2, and  a left internal mammary artery to the left anterior descending.  There  is a competitive flow down the distal left anterior descending as the  proximal left anterior descending has only about a 70% stenosis in it.  The native mid right coronary artery, the native first obtuse marginal,  and the native second obtuse marginal are all occluded.  I do not see  disease to explain the patient's chest pain as there do not appear to be  lesions that would cause significant ischemia.  There was a 99% stenosis  of proximal first diagonal; however, this vessel is very small and  supplies very little  territory.  The patient also complains of dyspnea  on exertion; however, left ventricular systolic function appears normal  and the left ventricular end-diastolic pressure is normal at 7 mmHg.  Our plan at this time would be medical management of coronary artery  disease.      Marca Ancona, MD  Electronically Signed     DM/MEDQ  D:  09/05/2008  T:  09/06/2008  Job:  [id number]

## 2010-09-11 NOTE — H&P (Signed)
NAME:  LYNELL, KUSSMAN NO.:  [id number]   MEDICAL RECORD NO.:  [id number]          PATIENT TYPE:  INP   LOCATION:  3705                         FACILITY:  [facility]   PHYSICIAN:  Bevelyn Buckles. Bensimhon, MDDATE OF BIRTH:  05-Apr-1932   DATE OF ADMISSION:  09/04/2008  DATE OF DISCHARGE:                              HISTORY & PHYSICAL   PRIMARY CARE PHYSICIAN:  Georgina Quint. Plotnikov, MD   PRIMARY CARDIOLOGIST:  Jesse Sans. Wall, MD, [facility]   CHIEF COMPLAINT:  Chest pain.   HISTORY OF PRESENT ILLNESS:  Mr. Mccadden is a 77 year old male with a  history of coronary artery disease.  Recently, his activity has been  decreased secondary caring for his wife who was ill with cancer.  He has  occasional chest pain which is generally exertional.  Recently, he has  also noted increased dyspnea on exertion and decreased exercise  tolerance.  Today, he was sitting in church and had sudden onset of  substernal chest pain.  He describes it as a squeezing.  It radiated  across his chest.  He got some shortness of breath with it but no  vomiting.  EMS was called, and he was transported to [facility].  He describes the pain as angina.  He was transported to the  hospital and currently in the emergency room is resting comfortably on  IV fluids and O2.   PAST MEDICAL HISTORY:  1. Status post aortocoronary bypass surgery in 2002 with LIMA to LAD,      SVG to OM1 and OM2, SVG to PDA.  2. Hypertension.  3. Hyperlipidemia.  4. Family history of coronary artery disease in his father.  5. History of atrial flutter with ablation.  6. Non-ST segment elevation in 2002 prior to bypass.  7. History of PTCA in 1993.  8. Gastroesophageal reflux disease.  9. History of anemia.  10.History of anxiety and depression.  11.History of melanoma.   SURGICAL HISTORY:  He is status post cardiac catheterization as well as  bypass surgery, electrophysiology study and ablation, resection of skin  cancer, bilateral hernia repair, cholecystectomy, exploratory laparotomy  after liver trauma and left hand surgery.   ALLERGIES:  He is allergic or intolerant to IV DYE, IODINE, and CIPRO.   CURRENT MEDICATIONS:  1. Aspirin 81 mg a day.  2. Avodart 0.5 mg daily.  3. Crestor 10 mg a day.  4. Flomax 0.4 mg daily.  5. Metoprolol 50 mg daily.  6. Nexium 40 mg a day.  7. Sertraline 50 mg daily  8. Zolpidem 5 mg nightly.  9. Sublingual nitroglycerin.   SOCIAL HISTORY:  He lives in [place] with his wife.  He is retired  from [employer].  He quit tobacco greater than 50 years ago and does not  abuse alcohol or drugs.   FAMILY HISTORY:  Both of his parents were in their mid to late 47s when  they died.  His father had heart disease, but his mother did not.  No  siblings have heart disease.   REVIEW OF SYSTEMS:  He has had no fevers, chills, sweats,  or recent  illnesses.  He has chronic dyspnea on exertion which seems a little  worse recently.  He got a little short of breath with chest pain.  He  denies orthopnea, PND, or palpitations.  He has not had any coughing or  wheezing.  He has occasional arthralgias.  His reflux symptoms are well  controlled on the Nexium, and he has had no melena.  Full 14-point  review of systems is otherwise negative.   PHYSICAL EXAMINATION:  VITAL SIGNS:  Temperature is 97.9, blood pressure  136/80, pulse 53, respiratory rate 16, O2 saturation 100% on 2 L.  GENERAL:  He is a well-developed, well-nourished white male in no acute  distress.  HEENT:  Normal.  NECK:  There is no lymphadenopathy, thyromegaly, bruit, or JVD noted.  CV:  His heart is regular in rate and rhythm with an S1 and S2 and no  significant murmur, rub, or gallop is noted.  Distal pulses are intact  in all 4 extremities.  LUNGS:  Clear to auscultation bilaterally.  SKIN:  No rashes or lesions are noted.  ABDOMEN:  Soft and nontender with active bowel sounds.  EXTREMITIES:  There is no  cyanosis, clubbing, or edema noted.  MUSCULOSKELETAL:  There is no joint deformity or effusions and no spine  or CVA tenderness.  NEUROLOGIC:  He is alert and oriented.  Cranial nerves II through XII  grossly intact.   Chest x-ray is post bypass but no acute disease.   EKG sinus bradycardia, rate 54, with no acute ischemic changes.   Laboratory values are incomplete, but point-of-care markers are negative  x1, and CK-MB and troponin I negative x1.   IMPRESSION:  Mr. Pierpoint was evaluated by Dr. Gala Romney.  His chest pain  is concerning for unstable anginal pain.  Pain has now resolved.  He  will be admitted, and we will rule out myocardial infarction.  He will  be treated  with heparin intravenous, nitroglycerin, aspirin, and Plavix.  He will  be continued on his other home medications.  His heart rate is in the  50s, and he is asymptomatic with this, so no change in his beta-blocker  dosage will be made.  Cardiac catheterization is planned tomorrow with  further evaluation and treatment depending on the results.      Theodore Demark, PA-C      Bevelyn Buckles. Bensimhon, MD  Electronically Signed    RB/MEDQ  D:  09/04/2008  T:  09/05/2008  Job:  [id number]

## 2010-09-14 ENCOUNTER — Ambulatory Visit: Payer: Medicare Other | Admitting: Cardiology

## 2010-09-14 NOTE — Consult Note (Signed)
[facility]  Patient:    Troy Combs, Troy Combs                      MRN: [id number] Proc. Date: 10/14/00 Adm. Date:  [id number] Attending:  Mirian Mo                          Consultation Report  NO DICTATION.....DISCONNECTED DD:  10/14/00 TD:  10/15/00 Job: [id number]

## 2010-09-14 NOTE — Consult Note (Signed)
[place]. [facility]  Patient:    Troy Combs, Troy Combs                      MRN: [id number] Adm. Date:  [id number] Attending:  Mirian Mo                          Consultation Report  REQUESTING PHYSICIAN:  Dr. Jens Som.  FOLLOW-UP CARDIOLOGIST:  Dr. Daleen Squibb.  PRIMARY CARE PHYSICIAN:  Dr. Posey Rea.  REASON FOR CONSULTATION:  Unstable angina.  HISTORY OF PRESENT ILLNESS:  The patient is a 77 year old male with known coronary occlusive disease, having had three angioplasties in 1994, by Dr. Riley Kill.  Neither the chart nor the patient remember what vessels were done.  However, the patient had done well until Sep 13, 2000, when, while visiting [place], [place], developed prolonged substernal chest pain.  He was transferred to [facility] and a cardiac catheterization was done. He was hospitalized there for five days and was cared for by Dr. Margaretmary Eddy.  He was discharged home with medical therapy.  He was started in cardiac rehabilitation.  This admission was precipitated by onset of substernal chest pain radiating into his jaw while exercising in cardiac rehabilitation.  The pain was relieved by rest and nitroglycerin, and he was stabilized in the emergency room.  The patient noted that for several days prior to episode in cardiac rehabilitation, he had been having exertionally related substernal pain radiating to the base of the neck and jaw, primarily relieved with rest. Denies nighttime angina.  He had no prior history of myocardial infarction but does have a decreased ejection fraction of approximately 30%.  He noted 10-15 years ago having a prolonged episode of chest pain and shortness of breath but did not seek medical treatment at that time.  CARDIAC RISK FACTORS:  The patient has had hypertension.  He denies hyperlipidemia.  Does have a family history of coronary artery disease in his father, who had bypass surgery; none in his mother.  He  has one brother with multiple sclerosis.  He has an older sister without known heart problems.  He denies any previous stroke, peripheral vascular disease, or COPD.  He has had no history of renal insufficiency.  PAST SURGICAL HISTORY: 1. Ruptured liver in 1952, from a football injury, requiring exploratory    laparotomy. 2. In 1976, cholecystectomy. 3. In 1978, melanoma resected off the left shoulder, level 4.  Also had left    axillary node dissection at that time. 4. In 1994, had three angioplasties by Dr. Riley Kill.  The vessels are not known    and not recorded in the chart. 5. In 2001, he had bilateral inguinal hernia repair.  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  He was previously employed by [employer].  ADMISSION MEDICATIONS: 1. Plavix 75 mg a day. 2. Nexium 40 mg a day. 3. Altace 2.5 b.i.d. 4. One aspirin a day. 5. Zoloft 50 mg a day. 6. Isosorbide 30 mg a day. 7. Pravachol 20 mg a day. 8. Flomax 0.4 mg a day. 9. B6 and B12 vitamin supplements.  DRUG ALLERGIES AND SENSITIVITIES:  The patient notes a CONTRAST DYE ALLERGY, developed RESPIRATORY PROBLEMS and HYPOTENSION, following an angioplasty in the past.  Since he has been premedicated with previous subsequent catheterizations he has had no difficulty.  REVIEW OF SYSTEMS:  CARDIAC:  The  patient notes the chest pain, occasional palpitations, exertional shortness of breath.  Denies lower extremity edema, resting shortness of breath, syncope, presyncope, orthopnea.  GENERAL:  The patient has no constitutional symptoms.  Denies any night sweats, fevers, or chills.  Has had no weight loss.  RESPIRATORY:  No hemoptysis. GASTROINTESTINAL:  He has rare occasion of reflux, especially when he notes working and bending over frequently.  Has not been on Nexium for a long period of time.  NEUROLOGIC:  Without complaints.  MUSCULOSKELETAL:  Has  bilateral knee discomfort when he walks a lot.  Denies claudication.  GENITOURINARY: Has some urinary frequency but no blood in his urine.  Currently on Flomax for this.  Denies any infections.  Denies any previous hematologic disorders. Denies thyroid problems or diabetes.  PSYCHIATRIC HISTORY:  Currently on Zoloft.  He denies any peripheral vascular disease.  Other review of systems are negative.  PHYSICAL EXAMINATION:  VITAL SIGNS:  Blood pressure 120/80, sinus rhythm at 60, O2 saturations 96% on room air.  GENERAL:  Healthy-appearing male, alert and oriented.  Neurologically intact. Able to relate his history.  HEENT:  Pupils are equal, round, and reactive to light.  NECK:  Without jugular venous distention or carotid bruits.  He has no palpable masses.  CHEST:  Incision over the left axilla and left shoulder associated with his melanoma excision.  I do not appreciate cervical or supraclavicular adenopathy.  CARDIAC:  Regular rate and rhythm.  Without murmurs or gallops.  ABDOMEN:  Benign.  Without palpable mass.  He does have incisions in the right upper quadrant associated with previous surgery.  EXTREMITIES:  With 2+ DP and PT pulses bilaterally.  Without evidence of ischemic skin changes.  LABORATORY DATA:  Creatinine 0.8, potassium 4, sodium 142.  Troponin 0.1. CBC:  Hematocrit 38.5, platelet count 204.  Chest x-ray reveals probable chronic changes of the right base.  No active lung disease.  EKG shows normal sinus rhythm without ischemic changes.  Cardiac catheterization films from [facility] are reviewed, dated Sep 15, 2000. The patient has a decreased ejection fraction, approximately 30-35%.  Left main is patent.  LAD has a proximal 70% hazy lesion.  There is a subtotal lesion of the second obtuse marginal, with 50-60% lesions in the first obtuse marginal.  The right coronary artery is totally occluded with collateral filling from the left system to the distal  PD and PL.  IMPRESSION:  Patient with three-vessel coronary artery disease including  significant LAD disease, who presents with recurrent unstable anginal symptoms in spite of medical therapy following myocardial infarction one month ago. With these findings I agree with the recommendation to proceed with coronary artery bypass grafting.  The risks and options have been discussed with the patient and his wife in detail, including the risk of death, infection, stroke, myocardial infarction, bleeding, and blood transfusion.  The patient is willing to proceed.  We tentatively plan for surgery on Friday, October 17, 2000. DD:  10/14/00 TD:  10/15/00 Job: [id number]

## 2010-09-14 NOTE — Discharge Summary (Signed)
[place]. [facility]  Patient:    Troy Combs, Troy Combs                      MRN: [id number] Adm. Date:  [id number] Disc. Date: [id number] Attending:  Waldo Laine Dictator:   Carlye Grippe. CC:         Gwenith Daily. Tyrone Sage, M.D.  Madolyn Frieze Jens Som, M.D. [facility]  Sonda Primes, M.D. [facility]  Maisie Fus C. Wall, M.D. [facility]   Discharge Summary  HISTORY OF PRESENT ILLNESS:  This is a 77 year old male with known coronary artery occlusive disease having three previous angioplasties in 1994 by Dr. Riley Kill.  Neither the chart nor the patient recall which vessels were done at that time.  The patient had done quite well with medical therapy, until Sep 13, 2000, when while visiting [place], [place] he developed an episode of prolonged substernal chest pain.  He was hospitalized at [facility] and a cardiac catheterization was done.  He was discharged home with medical therapy.  He was started on cardiac rehabilitation.  He was admitted this hospitalization secondary to a new onset of substernal chest pain radiating into his jaw while exercising during his cardiac rehabilitation.  The pain was relieved by rest and nitroglycerin.  He was stabilized in the emergency room and felt to require further evaluation and treatment including cardiac surgical opinion.  The patient has no prior history of myocardial infarction but does have a decreased ejection fraction of approximately 30%.  He did have an episode approximately 15 years ago when he did have shortness of breath but sought no medical treatment at that time.  PAST MEDICAL HISTORY:  Hypertension, hyperlipidemia.  Positive family history of coronary artery disease in his father who had bypass surgery.  None in his mother.  PAST SURGICAL HISTORY: 1. Ruptured liver in 1952 from a football injury requiring exploratory    laparotomy. 2. In 1976 he had cholecystectomy. 3. In 1978 had melanoma resected from the  left shoulder, level IV.  He also    had a left axillary node dissection at that time. 4. In 1994, angioplasty x 3 by Dr. Riley Kill. 5. In 2001, he had bilateral inguinal hernia repairs.  MEDICATIONS ON ADMISSION: 1. Plavix 75 mg q.d. 2. Nexium 40 mg q.d. 3. Altace 2.5 mg b.i.d. 4. Aspirin 1 a day. 5. Zoloft 50 mg q.d. 6. Isosorbide 30 mg q.d. 7. Pravachol 20 mg q.d. 8. Flomax 0.4 mg q.d. 9. Vitamin B6 and vitamin B12 supplements.  ALLERGIES:  CONTRAST DYE, in which he developed respiratory problems and hypotension following an angioplasty in the past.  He has been premedicated with subsequent catheterizations and had no difficulty.  FAMILY HISTORY/SOCIAL HISTORY/REVIEW OF SYSTEMS/PHYSICAL EXAMINATION:  Please see the history and physical done at the time of admission.  HOSPITAL COURSE:  The patient was admitted with what was felt to be unstable angina in a known coronary artery disease patient.  He was started on nitroglycerin, heparin, aspirin, and beta-blocker.  Cardiac surgical consultation was obtained with Dr. Sheliah Plane, who evaluated the patient and his studies and agreed with recommendations to proceed with cardiac surgical revascularization.  On October 17, 2000, the patient underwent the following procedure:  Coronary artery bypass grafting x 4.  The following grafts were placed:  1. Left internal mammary artery to the LAD. 2. Sequential saphenous vein graft to the obtuse marginal #1 and obtuse    marginal #2. 3. Saphenous vein graft to  posterior descending.  Cross-clamp time was 60 minutes; pump time 111 minutes.  The patient tolerated the procedure well and was taken to the surgical intensive care unit in stable condition.  POSTOPERATIVE HOSPITAL COURSE:  The patient initially did quite well.  He did require transfusion for a perioperative blood loss and hematocrit postop day #1 of 22%.  During the postoperative phase, he developed atrial fibrillation and  flutter with rapid ventricular response.  Subsequently, he developed hypoxemic respiratory failure and required reintubation.  Critical care medicine was consulted and they managed him through his extubation.  His rhythm was stabilized with chemical cardioversion with digoxin and amiodarone. he has tolerated a gradual increase in his activity and weaning of oxygen without significant difficulties.  His incisions are healing well without signs of infection.  He is tolerating a diet without significant difficulties. He has been resumed on his ACE inhibitor.  His laboratory values are stable. He is tolerating cardiac rehabilitation Phase I modalities.  He is afebrile with stable hemodynamics and overall felt to be quite stable for tentative discharge in the morning of October 26, 2000 pending morning round reevaluation.  FINAL DIAGNOSES:  1. Coronary artery disease with unstable angina on admission and ischemic     cardiomyopathy.  2. Gastroesophageal reflux disease.  3. Anxiety.  4. Depression.  5. Hyperlipidemia.  6. Previous percutaneous transluminal coronary angioplasty.  7. Previous surgeries including ruptured liver with exploratory laparotomy,     cholecystectomy, bilateral inguinal hernia repair, and melena resection     from shoulder.  8. Hypertension.  9. Postoperative hypoxemic respiratory failure, resolved. 10. Postoperative atrial fibrillation and flutter, resolved. 11. Postoperative anemia, improved.  DISCHARGE MEDICATIONS:  1. Amiodarone 400 mg b.i.d. for seven days then q.d.  2. Nexium 40 mg q.d.  3. Folic acid 1 mg daily.  4. Aspirin 325 mg q.d.  5. Zoloft 50 mg q.d.  6. Lanoxin 0.125 mg q.d.  7. Altace 2.5 mg b.i.d.  8. Tylox p.r.n.  9. He is also to resume his Flomax.   CONDITION ON DISCHARGE:  Stable and improving.  DISCHARGE INSTRUCTIONS:  The patient received written instructions regarding medications, activity, diet, wound care, and follow-up.  FOLLOW-UP:   Dr. Daleen Squibb in two weeks and Dr. Tyrone Sage in three weeks.  The office will call with those appointments. DD:  10/25/00 TD:  10/25/00 Job: [id number]

## 2010-09-14 NOTE — Op Note (Signed)
[place]. [facility]  Patient:    Troy Combs, Troy Combs                      MRN: [id number] Proc. Date: 10/17/00 Adm. Date:  [id number] Attending:  Waldo Laine CC:         Jesse Sans. Wall, M.D. [facility]   Operative Report  PREOPERATIVE DIAGNOSIS:  Coronary occlusive disease with postinfarctional angina.  POSTOPERATIVE DIAGNOSIS:  Coronary occlusive disease with postinfarctional angina.  PROCEDURE:  Coronary artery bypass grafting x 4 with the left internal mammary to the left anterior descending coronary artery, sequential reversed saphenous vein graft to the first and second obtuse marginals, reversed saphenous vein graft to the posterior descending coronary artery.  SURGEON:  Gwenith Daily. Tyrone Sage, M.D.  FIRST ASSISTANT:  Loura Pardon, P.A.  BRIEF HISTORY:  The patient is a 77 year old male who approximately one month prior to surgery suffered a subendocardial myocardial infarction.  He underwent cardiac catheterization at [facility], and medical therapy was recommended.  Patient started in cardiac rehab at [facility] and began having recurrent chest pain.  Films from [facility] were reviewed, which showed a 70% proximal LAD lesion, a 60-70% first obtuse marginal, subtotal second obtuse marginal that had faint collateral filling, and total occlusion of the right coronary artery.  Because of failure of medical therapy with three-vessel coronary artery disease, coronary artery bypass grafting was recommended to the patient, who agreed and signed informed consent.  DESCRIPTION OF PROCEDURE:  With Swan-Ganz and arterial line monitors in place, patient underwent general endotracheal anesthesia without incident.  Skin of the chest and legs was prepped with Betadine and draped in the usual sterile manner.  A segment of vein was harvested from the right lower extremity and was of excellent quality and caliber.  A median sternotomy was performed.   The left internal mammary artery was dissected down as a pedicle graft.  The distal artery was divided, had good, free flow.  Pericardium was opened. Overall ventricular function appeared preserved without any obvious areas of infarct on the anterior wall.  Patient was systemically heparinized. Ascending aorta and the right atrium were cannulated, and the aortic root vent and cardioplegia needle was introduced into the ascending aorta.  The patient was placed on cardiopulmonary bypass 2.4 L/min. per sq m.  Sites of anastomosis were selected and dissected out of the epicardium.  The patients body temperature was cooled to 30 degrees, the aortic crossclamp was applied, and 500 cc of cold blood potassium cardioplegia was administered with rapid diastolic arrest of the heart.  Myocardial septal temperature was monitored throughout the crossclamp.  Attention was turned first to the posterior descending coronary artery, which was opened and admitted a 1.5 mm probe. Though a slightly small vessel, it was of good quality.  Using a running 7-0 Prolene, distal anastomosis was performed with a segment of reversed saphenous vein graft.  Attention was then turned to the OM-1 and OM-2.  The first obtuse marginal was diffusely diseased with plaque throughout the vessel.  In the midportion, the vessel was opened and admitted a 1 mm probe distally and a 1.5 mm probe proximally.  Using a short natural natural wide anastomosis was carried out with a running 7-0 Prolene.  The distal extent of the same vein was then carried to the second obtuse marginal.  This was the vessel that angiographically was subtotal.  The vessel was much larger than thought based on the films, admitted a  1.5 mm probe proximally and distally.  Using a running 7-0 Prolene, distal anastomosis was performed.  Attention was then turned t otherwise healthy left anterior descending coronary artery.  Between the mid- and distal third of the  vessel was opened and admitted a 1.5 mm probe distally.  Using a running 8-0 Prolene, the left internal mammary artery was anastomosed to the left anterior descending coronary artery.  With release of the Edwards bulldog on the mammary artery, there was appropriate rise in myocardial septal temperature.  Aortic crossclamp was removed with a total crossclamp time of 60 minutes.  The patient required electrical defibrillation and returned to a sinus rhythm.  A partial occlusion clamp was placed on the ascending aorta, and two punch aortotomies were performed.  Each of the two vein grafts were anastomosed to the ascending aorta.  Air was evacuated from the grafts, and the partial occlusion clamp was removed.  Sites of anastomosis were inspected and were free of bleeding.  The patient was then ventilated and weaned from cardiopulmonary bypass without difficulty.  He remained hemodynamically stable.  He was decannulated in the usual fashion.  Protamine sulfate was administered.  With the operative field hemostatic, two atrial and two ventricular pacing wires were applied, graft markers were applied.  A left pleural tube, two mediastinal tubes were left in place.  The sternum was closed with #6 stainless steel wire.  Fascia closed with interrupted 0 Vicryl, running 3-0 Vicryl in the subcutaneous tissue, 4-0 subcuticular stitch in the skin edges.  Dry dressings were applied.  Sponge and needle count was reported as correct at the completion of the procedure.  The patient tolerated the procedure without obvious complication and was transported to the surgical intensive care unit for further postoperative care. DD:  10/17/00 TD:  10/18/00 Job: [id number]

## 2010-09-14 NOTE — Procedures (Signed)
[place]. [facility]  Patient:    Troy Combs, Troy Combs Visit Number: [id number] MRN: [id number]          Service Type: CAT Location: [id number] Attending Physician:  Lewayne Bunting Proc. Date: 05/04/01 Admit Date:  05/04/2001   CC:         Thomas C. Wall, M.D. [facility]  Sonda Primes, M.D. [facility]   Procedure Report  PROCEDURE PERFORMED:  Electrophysiology study and catheter ablation of atrial flutter utilizing mapping of the tachycardia and IV sedation.  INDICATION:  The patient is a very pleasant 77 year old male with ischemic heart disease status post coronary artery bypass grafting.  He had postoperative atrial arrhythmias.  He was seen in the office several weeks ago and had atrial flutter with a rapid ventricular rate.  He is now referred for electrophysiology study and catheter ablation.  DESCRIPTION OF PROCEDURE:  After informed consent was obtained, the patient was taken to the diagnostic EP lab in the fasting state.  After the usual preparation and draping, intravenous fentanyl and midazolam were given for sedation.  A 6 French catheter was inserted percutaneously in the right jugular vein and advanced to the coronary sinus.  A 7 French 24 catheter was inserted percutaneously in the right femoral vein and advanced to the right atrium.  A 5 French quadripolar catheter was inserted percutaneously in the right femoral vein and advanced to the HIS bundle region.  After measurement of the basic intervals, rapid atrial pacing was carried out from the coronary sinus demonstrating an AV Wenckebach cycle length of 400 ms.  The patients interval decreased down to 210 ms where atrial flutter was initiated.  Flutter was counter-clock-wise in orientation around the tricuspid annulus and the atrial activation was early in the CS proximal compared to the CS distal.  The tachycardia was sustained, but terminated spontaneously.  Next, percutaneous stimulation was  carried out from the coronary sinus to a cycle length of 500 ms.  This was 200 intervals step-wise decreased down to 390 ms with AV nodal ERP was observed.  Additional decrements in the S1 and S2 interval down to 290 ms resulting in the atrial ERP.  At this point, additional rapid atrial pacing was carried out from the coronary sinus and atrial flutter was recurrent and easily induced.  However, it was spontaneously self-terminating typically lasting 30-40 seconds.  Attempts to trying the tachycardia were unsuccessful secondary to atrial noncapture.  Multiple attempts at this were carried out, but could not be successfully demonstrated.  Because of the patients typical atrial flutter in the past and because his mapping demonstrated what appeared to be typical atrial flutter, the ablation catheter was maneuvered into the usual atrial flutter isthmus.  Seven RF-energy applications were carried out during coronary sinus pacing and this resulted in the creation of bidirectional block.  Six RF-energy applications were also delivered.  The patient was observed for approximately 45 minutes after catheter ablation and there was no evidence of residual flutter isthmus conduction.  At this point, the catheters were removed, hemostasis assured, and the patient returned to his room in satisfactory condition.  Complications none.  RESULTS: A. Baseline ECG.  Normal sinus rhythm with sinus node cycle    length of 1048 ms.  The HP interval was 51 ms. B. Rapid atrial pacing.  This demonstrated AV Wenckebach cycle    length of 400 ms.  During rapid atrial pacing, the PR interval    remained less than R interval. C. Programmed atrial  stimulation.  This was carried out from the    coronary sinus as well as the right atrium with cycle length of    500 ms.  The S1 is 200 ms decreased down 390 ms with ERP of the    AV node was observed.  Additional decrements down to 290 ms    resulting in atrial  refractions. D. Arrhythmias observed. 1. Atrial flutter, initiation rapid atrial pacing, duration    sustained, termination spontaneous. E. Mapping.  Mapping of the patients atrial flutter demonstrated    typical counter-clock-wise tricuspid annular reentry.  It    should be noted that the atrial flutter isthmus was quite long    and the coronary sinus ostium more posteriorly located. F. Energy application.  A total of 13 RF-energy applications    including six bonus RF-energy applications were delivered to the    atrial flutter isthmus.  During the seventh RF-energy    application, there was bidirectional block observed.  Six bonus    RF-energy applications were delivered.  The patient was    observed for 45 minutes after ablation and there was no    additional atrial flutter isthmus conduction.  CONCLUSION:  The study demonstrates successful electrophysiologic study catheter ablation typical counter-clock-wise atrial flutter with a total of 13 RF-energy applications delivered to the atrial flutter isthmus.  The patient tolerated the procedure well.  He is returned to his room in satisfactory condition. Attending Physician:  Lewayne Bunting DD:  05/04/01 TD:  05/04/01 Job: [id number]

## 2010-09-14 NOTE — Op Note (Signed)
NAME:  Troy Combs, BOIKE NO.:  [id number]   MEDICAL RECORD NO.:  [id number]                   PATIENT TYPE:  AMB   LOCATION:  DSC                                  FACILITY:  [facility]   PHYSICIAN:  Katy Fitch. [name], M.D.          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   OPERATION:  1. Arthrodesis of left long finger distal interphalangeal joint with     Kirschner wire fixation times three.  2. Resection of nail fold mucus cyst.   SURGEON:  Katy Fitch. Sypher, M.D.   ASSISTANT:  Jonni Sanger, P.A.   ANESTHESIA:  IV regional at proximal forearm level.   SUPERVISING ANESTHESIOLOGIST:  Janetta Hora. Gelene Mink, M.D.   INDICATIONS FOR PROCEDURE:  The patient is a 77 year old gentleman referred  by Dr. Posey Rea for evaluation and management of a chronic mucus cyst and  painful distal interphalangeal joint affecting the left long finger.   Due to failure to response to nonoperative measures, he is brought to the  operating room at this time for reconstructive surgery including arthrodesis  of the distal interphalangeal joint and resection of his nail fold mucus  cyst.   After informed consent he is brought to the operating room at this time.   DESCRIPTION OF PROCEDURE:  The patient is brought to the operating room and  placed in the supine position on the operating table.  Dr. Gelene Mink  supervised placement of a forearm level  IV regional block.  When anesthesia  was satisfactory, the arm was prepped with Betadine soap and solution and  sterilely draped.   The procedure commenced with a curvilinear incision over the dorsal aspect  of the distal interphalangeal joint.  Subcutaneous tissue carefully divided  revealing hypertrophic synovitis extruding beneath the extensor mechanism.  The extensor was transected proximal to its insertion and the joint opened.  The collateral ligaments were released and resected followed by thorough  synovectomy.  The mucus cyst was noted to be draining deep to the dorsal  nail fold.  The rongeur was used to remove the membrane on the mucus cyst  and a micro curet was used along the nail fold to remove the remnants of the  mucus cyst deep to the dorsal nail fold.   After the nail fold was cleared, Betadine was used to saturate the wound to  be certain that we did not create a wound infection  by interruption of the  dorsal nail fold field.   The distal interphalangeal joint was fashioned into a cup and cone type  arthrodesis with use of 0.035 inch Kirschner wires times three  to obtain  satisfactory fixation of the arthrodesis at 5 degrees flexion, neutral  rotation and slight supination to allow pulp to pulp pinch of the thumb.  AP  and lateral C-arm images were obtained documenting a very satisfactory  position of the arthrodesis and Kirschner wire fixation.   The wound was then irrigated thoroughly with sterile saline followed by  repair of the extensor mechanism with mattress sutures of 4-0 Mersilene  followed by repair of the skin with interrupted suture of 5-0 Nylon.  A  compression dressing was applied with a volar plaster splint with hand in 5  degrees of dorsiflexion.  The patient tolerated the surgery and anesthesia  well.  He was transferred to the recovery room with stable vital signs.   He will be discharged home with prescriptions for Keflex 500 mg one p.o.  q.8h.  times four as a prophylactic antibiotic, also Percocet 5 mg one p.o.  q.4-6h. p.r.n. pain and Advil as-needed.                                                Katy Fitch [name], M.D.    RVS/MEDQ  D:  07/06/2002  T:  07/06/2002  Job:  [id number]   cc:   Georgina Quint. Plotnikov, M.D. [facility]   Anesthesia Department

## 2010-09-14 NOTE — Discharge Summary (Signed)
[facility]  Patient:    Troy Combs, Troy Combs Visit Number: [id number] MRN: [id number]          Service Type: CAT Location: [phone] Attending Physician:  Lewayne Bunting Dictated by:   Chinita Pester, N.P. Admit Date:  05/04/2001 Discharge Date: 05/05/2001   CC:         Doylene Canning. Ladona Ridgel, M.D. [facility] at [facility]   Discharge Summary  PRIMARY DIAGNOSIS:  Atrial flutter.  HISTORY OF PRESENT ILLNESS:  The patient is a 77 year old gentleman status post coronary artery bypass grafting in June of 2002 with documented postoperative atrial fibrillation and atrial flutter treated with amiodarone and digoxin who now presents with recurrent atrial tachyarrhythmias.  The patient has maintained sinus rhythm since bypass surgery.  The amiodarone, however, was discontinued in July due to nausea and vomiting which subsequently resolved.  He had a normal LV function by both catheterization and a recently negative stress Cardiolite. He has no prior history of stroke. The patient at around 11 oclock on 04/08/01 developed tachy-palpitations which persisted and prompted the patient to call the office.  He was found to be in atrial flutter.  HOSPITAL COURSE :  The patient was admitted on 05/04/01 for atrial flutter ablation.  He underwent atrial flutter ablation, tolerated the procedure well. He had a mild right groin oozing.  Pressure was held postoperatively.  The following day the patient remained in normal sinus rhythm.  He had no further complications.  Groin was without bleeding.  He was discharged to home on the following medications.  DISCHARGE MEDICATIONS: 1. Nexium 40 mg q.d. 2. Toprol XL 50 mg q.d. 3. Zoloft 50 mg q.d. 4. Enteric coated aspirin 325 mg daily. 5. Flomax 0.4 mg nightly. 6. Folic acid 1 mg q.d. 7. Pravachol 20 mg nightly. 8. Coumadin 5 mg nightly. 9. Antibiotics p.r.n. prior to any dental, urine, bowel procedures for the    next three  months.  ACTIVITY:  No strenuous activity or heavy lifting for four days.  No driving for two days.  DIET:  Low fat, low salt, low cholesterol diet.  WOUND CARE: Patient is allowed to shower, remove the Band-aids from his groin. If he develops any lumps or drainage he is to notify the office.  FOLLOW UP:  Patient is scheduled for an appointment at the [facility] on Monday at 4:15 and follow up with Dr. Ladona Ridgel on 06/16/01 at 11:30 AM. Dictated by:   Chinita Pester, N.P. Attending Physician:  Lewayne Bunting DD:  05/05/01 TD:  05/05/01 Job: [id number]

## 2010-09-14 NOTE — Discharge Summary (Signed)
Troy Combs. [facility]  Patient:    Troy, Combs Visit Number: [id number] MRN: [id number]          Service Type: MED Location: [phone] Attending Physician:  Troy Combs Dictated by:   Troy Combs, P.A.-C. Admit Date:  03/05/2001 Discharge Date: 03/08/2001   CC:         Troy Combs, M.D. Troy Combs Troy Troy Combs, M.D. [facility] Combs Combs  Troy Combs, M.D.   Discharge Summary  DATE OF BIRTH:  [birth year]  PROCEDURES:  Adenosine Cardiolite.  HOSPITAL COURSE:  [name] is a 77 year old male with a history of PTCA in 1993 and non-Q-wave MI in June 2002, for which he underwent CABG.  The grafts included the LIMA to LAD, SVG to OM-1 and OM-2, and SVG to PDA.  He has been going through rehab very well and is in fact due to complete rehab next week. He had onset of chest pressure that radiated into his neck and reached an intensity of 8/10.  He took nitroglycerin x 3 without relief and called EMS. He was pain-free upon examination in the hospital.  He was admitted to rule out MI and for further evaluation.  His enzymes were negative for MI, and he had an echocardiogram done that showed mild posterior hypokinesis but normal LV and no significant new Combs motion abnormalities.  Because of this, he was scheduled for a Cardiolite.  A stress Cardiolite was attempted, but the patient had had a dose of Lopressor that morning and was unable to reach the target heart rate.  He stated that he was unable to continue at a heart rate of 106 when his goal was 130.  The patient did also complain of some chest pressure that radiated up into his jaw, but this was not the exercise-limiting symptom.  The chest pain resolved with rest.  He had an adenosine Cardiolite instead and with the infusion, he had slight chest pain and facial flushing and some dizziness but no EKG changes, and his vital signs remained stable. The Cardiolite was reviewed with radiology, and his EF was  53% with no scar and no ischemia.  The patient had some slight dizziness with Lopressor at 12.5 q.i.d., but the dosage was decreased to Toprol XL 25 mg q.d.  The patient was considered stable for discharge on 03/08/01.  DISCHARGE CONDITION:  Improved.  CONSULTS:  None.  COMPLICATIONS:  None.  DISCHARGE DIAGNOSES:  1. Chest pain, negative myocardial infarction by enzymes and negative     Cardiolite this admission.  2. Status post aortocoronary bypass surgery in June 2002 with left internal     mammary artery to left anterior descending; saphenous vein graft to obtuse     marginal 1 and obtuse marginal 2 and saphenous vein graft to posterior     descending artery.  3. Preserved left ventricular function with an ejection fraction of 53% by     catheterization this admission.  4. History of non-Q-wave myocardial infarction, June 2002.  5. History of percutaneous transluminal coronary angioplasty in 1993.  6. History of laryngeal problems for which he sees Dr. Pollyann Combs.  7. Hyperlipidemia.  8. History of melanoma with resection in 1978.  9. History of postoperative amiodarone after bypass surgery which resolved     with amiodarone. 10. History of anxiety and depression. 11. History of anemia. 12. Status post hernia repair bilaterally. 13. Status post cholecystectomy.  DISCHARGE INSTRUCTIONS: 1. His activity level is to be as  tolerated. 2. He is to stick to a low-fat diet. 3. He is to follow up with Dr. Daleen Squibb and call for an appointment. 4. He is to follow up with Dr. Posey Rea as needed. 5. He is to follow up with [facility] as scheduled.  DISCHARGE MEDICATIONS: 1. Nexium 40 mg b.i.d. 2. Coated aspirin 325 mg q.d. 3. Flomax 0.4 mg q.d. 4. Pravachol 20 mg q.d. 5. Folic acid 1 mg q.d. 6. Zoloft 50 mg q.d. 7. Ambien p.r.n. 8. Toprol XL 25 mg q.d. Dictated by:   Troy Combs, P.A.-C. Attending Physician:  Troy Combs DD:  03/08/01 TD:  03/08/01 Job: [id number]

## 2010-09-20 ENCOUNTER — Encounter: Payer: Self-pay | Admitting: Cardiology

## 2010-09-21 ENCOUNTER — Encounter: Payer: Self-pay | Admitting: Cardiology

## 2010-09-21 ENCOUNTER — Ambulatory Visit (INDEPENDENT_AMBULATORY_CARE_PROVIDER_SITE_OTHER): Payer: Medicare Other | Admitting: Cardiology

## 2010-09-21 VITALS — BP 110/74 | HR 85 | Resp 18 | Ht 70.0 in | Wt 182.0 lb

## 2010-09-21 DIAGNOSIS — I951 Orthostatic hypotension: Secondary | ICD-10-CM

## 2010-09-21 DIAGNOSIS — I2510 Atherosclerotic heart disease of native coronary artery without angina pectoris: Secondary | ICD-10-CM

## 2010-09-21 NOTE — Assessment & Plan Note (Signed)
Stable, no change in treatment.

## 2010-09-21 NOTE — Progress Notes (Signed)
   Patient ID: Troy Combs, male    DOB: 04-24-1932, 77 y.o.   MRN: [id number]  HPI  Mr Cauthon returns for E and M of his CAD. He complains of some dizziness on occasion a few minutes after he stands up. He has had two episodes in the past few weeks. He recently had a stable ENT exam. He take only Toprol for his heart and blood pressure. There is no record of him being badycardic. Last EKG by Dr Posey Rea showed NSR at a normal rate with no other changes. HR today is in the 70's.    Review of Systems  Constitutional: Negative for activity change.  HENT: Positive for tinnitus.   Eyes: Negative for visual disturbance.  Respiratory: Negative for chest tightness and shortness of breath.   Cardiovascular: Negative for chest pain, palpitations and leg swelling.  Musculoskeletal: Negative for myalgias and gait problem.  Neurological: Positive for dizziness. Negative for tremors, syncope, facial asymmetry, speech difficulty, numbness and headaches.      Physical Exam  Nursing note and vitals reviewed. Constitutional: He is oriented to person, place, and time. He appears well-developed and well-nourished. No distress.  HENT:  Head: Normocephalic and atraumatic.  Eyes: EOM are normal. Pupils are equal, round, and reactive to light.  Neck: Neck supple. No JVD present. No tracheal deviation present. No thyromegaly present.  Cardiovascular: Normal rate, regular rhythm and intact distal pulses.   No murmur heard. Abdominal: Soft. Bowel sounds are normal.  Musculoskeletal: Normal range of motion. He exhibits no edema.  Neurological: He is alert and oriented to person, place, and time.  Skin: Skin is warm and dry.  Psychiatric: He has a normal mood and affect.

## 2010-09-21 NOTE — Assessment & Plan Note (Signed)
He is quite orthostatic. HR in the 70's and he needs this for his CAD. I have asked him to drink more water and to cook and add a little salt to his food.

## 2010-09-21 NOTE — Patient Instructions (Signed)
Your physician recommends that you schedule a follow-up appointment in: 1 year with Dr. Daleen Squibb  Increase your fluid intake as well as your salt intake.

## 2010-10-01 ENCOUNTER — Emergency Department (HOSPITAL_COMMUNITY)
Admission: EM | Admit: 2010-10-01 | Discharge: 2010-10-01 | Disposition: A | Discharge status: Home / Self Care | Payer: Medicare Other | Attending: Emergency Medicine | Admitting: Emergency Medicine

## 2010-10-01 ENCOUNTER — Emergency Department (HOSPITAL_COMMUNITY): Payer: Medicare Other

## 2010-10-01 DIAGNOSIS — I10 Essential (primary) hypertension: Secondary | ICD-10-CM | POA: Insufficient documentation

## 2010-10-01 DIAGNOSIS — R002 Palpitations: Secondary | ICD-10-CM | POA: Insufficient documentation

## 2010-10-01 DIAGNOSIS — R109 Unspecified abdominal pain: Secondary | ICD-10-CM | POA: Insufficient documentation

## 2010-10-01 DIAGNOSIS — R110 Nausea: Secondary | ICD-10-CM | POA: Insufficient documentation

## 2010-10-01 DIAGNOSIS — I2510 Atherosclerotic heart disease of native coronary artery without angina pectoris: Secondary | ICD-10-CM | POA: Insufficient documentation

## 2010-10-01 DIAGNOSIS — K219 Gastro-esophageal reflux disease without esophagitis: Secondary | ICD-10-CM | POA: Insufficient documentation

## 2010-10-01 LAB — DIFFERENTIAL
Basophils Absolute: 0 10*3/uL (ref 0.0–0.1)
Basophils Relative: 0 % (ref 0–1)
Eosinophils Absolute: 0 10*3/uL (ref 0.0–0.7)
Eosinophils Relative: 0 % (ref 0–5)
Lymphocytes Relative: 6 % — ABNORMAL LOW (ref 12–46)
Lymphs Abs: 0.6 10*3/uL — ABNORMAL LOW (ref 0.7–4.0)
Monocytes Absolute: 0.4 10*3/uL (ref 0.1–1.0)
Monocytes Relative: 5 % (ref 3–12)
Neutro Abs: 7.9 10*3/uL — ABNORMAL HIGH (ref 1.7–7.7)
Neutrophils Relative %: 89 % — ABNORMAL HIGH (ref 43–77)

## 2010-10-01 LAB — CBC
HCT: 42.8 % (ref 39.0–52.0)
Hemoglobin: 15.1 g/dL (ref 13.0–17.0)
MCH: 29.7 pg (ref 26.0–34.0)
MCHC: 35.3 g/dL (ref 30.0–36.0)
MCV: 84.1 fL (ref 78.0–100.0)
Platelets: 140 10*3/uL — ABNORMAL LOW (ref 150–400)
RBC: 5.09 MIL/uL (ref 4.22–5.81)
RDW: 13.1 % (ref 11.5–15.5)
WBC: 9 10*3/uL (ref 4.0–10.5)

## 2010-10-01 LAB — CK TOTAL AND CKMB (NOT AT ARMC)
CK, MB: 1.7 ng/mL (ref 0.3–4.0)
Relative Index: INVALID (ref 0.0–2.5)
Total CK: 89 U/L (ref 7–232)

## 2010-10-01 LAB — TROPONIN I: Troponin I: <0.3 ng/mL (ref <0.30)

## 2010-10-01 LAB — BASIC METABOLIC PANEL
BUN: 17 mg/dL (ref 6–23)
CO2: 24 mEq/L (ref 19–32)
Calcium: 9.3 mg/dL (ref 8.4–10.5)
Chloride: 102 mEq/L (ref 96–112)
Creatinine, Ser: 0.82 mg/dL (ref 0.4–1.5)
GFR calc Af Amer: >60 mL/min (ref >60)
GFR calc non Af Amer: >60 mL/min (ref >60)
Glucose, Bld: 132 mg/dL — ABNORMAL HIGH (ref 70–99)
Potassium: 4.3 mEq/L (ref 3.5–5.1)
Sodium: 137 mEq/L (ref 135–145)

## 2010-10-02 ENCOUNTER — Telehealth: Payer: Self-pay | Admitting: Cardiology

## 2010-10-02 NOTE — Telephone Encounter (Signed)
I talked with Troy Combs. Troy Combs to [facility] ED  early Monday morning for palpitations. Troy Combs states Sunday around 10 pm he experienced increase in palpitations and  went to ED. Troy Combs denies feeling like he was going to pass out. ED records indicate Troy Combs having PVCs. Metoprolol increased from 50mg  daily to 100mg  daily. Troy Combs states he has done this but cannot tell a difference in his symptoms.  Troy Combs also states he is having diarrhea every time he stands up. He denies temperature or virus. He has never had diarrhea like this before. K 4.3  in ED 10/01/10. I will review with Dr Graciela Husbands.

## 2010-10-02 NOTE — Telephone Encounter (Signed)
Pt heart is out of rhythm went to ED and he is having VT palpitations and wants to be seen today

## 2010-10-02 NOTE — Telephone Encounter (Signed)
I reviewed with Dr Graciela Husbands. He did not have any new recommendations. He did agree with increasing metoprolol dose. I discussed with pt.  Pt has an appt 10/09/10 with Tereso Newcomer. Pt will keep that appt. Pt will also follow-up with his PCP about his diarrhea.

## 2010-10-07 ENCOUNTER — Other Ambulatory Visit: Payer: Self-pay | Admitting: Cardiology

## 2010-10-09 ENCOUNTER — Ambulatory Visit (INDEPENDENT_AMBULATORY_CARE_PROVIDER_SITE_OTHER): Payer: Medicare Other | Admitting: Physician Assistant

## 2010-10-09 ENCOUNTER — Encounter: Payer: Self-pay | Admitting: Physician Assistant

## 2010-10-09 VITALS — BP 112/70 | HR 60 | Ht 70.0 in | Wt 178.8 lb

## 2010-10-09 DIAGNOSIS — R002 Palpitations: Secondary | ICD-10-CM | POA: Insufficient documentation

## 2010-10-09 DIAGNOSIS — I951 Orthostatic hypotension: Secondary | ICD-10-CM

## 2010-10-09 DIAGNOSIS — I2510 Atherosclerotic heart disease of native coronary artery without angina pectoris: Secondary | ICD-10-CM

## 2010-10-09 DIAGNOSIS — I10 Essential (primary) hypertension: Secondary | ICD-10-CM

## 2010-10-09 NOTE — Assessment & Plan Note (Signed)
Stable.  I have recommended that he try compression stockings to see if this helps.  It does not seem that Toprol has worsened his orthostasis.

## 2010-10-09 NOTE — Assessment & Plan Note (Signed)
No angina.  Continue aspirin. 

## 2010-10-09 NOTE — Assessment & Plan Note (Signed)
He denies any symptoms reminiscent of his previous atrial flutter.  It sounds as though all of his symptoms are related to premature ventricular contractions. The increased dose of Toprol seems to be helping.  He had normal LV function by echocardiogram 4/12.  He had a normal TSH in April.  He had normal electrolytes when he was in the ED.  At this point, I recommend proceeding with a 48-hour Holter to assess for PVC burden.  If he has a significant percentage of PVCs, we can certainly consider referring him to EP.  I will have him follow up with Dr. Daleen Squibb in 4 weeks.

## 2010-10-09 NOTE — Progress Notes (Signed)
History of Present Illness: Primary Cardiologist:  Dr. Valera Castle  Troy Combs is a 77 y.o. male with a h/o CAD, s/p CABG in 2002, cath in 5/10 with patent L-LAD, S-RCA, S-OM1/OM2, EF 60%, atrial flutter, s/p prior RFCA, HTN, HLP and orthostatic hypotension.  His last echo in 4/12: Ef 55-65%, grade 1 diast dysfxn, mild focal basal hypertrophy of the septum, mild LAE.  Last myoview 8/10: apical thinning with mild apical ischemia, EF 65% (low risk).  He presented to the ED 6/4 with complaints of palpitations.  He awoke in the night with nausea.  He has had PVCs in the past.  This felt identical, however it was more frequent and more severe.  He denies syncope.  He has had problems with orthostasis recently.  Dr. Daleen Squibb saw him last month and had him increase his fluid and salt to help with this.  He denies chest discomfort.  He's had problems with dyspnea with exertion for 2 years.  Has been no significant change.  His PCP has placed him on December cord.  He is an ex-smoker.  In the ED, his potassium is 4.3, creatinine 0.82, hemoglobin 15.1.  Chest x-ray was normal.  He had PVCs on his EKG.  The case was discussed with the on-call cardiologist and his Toprol was increased from 50-100 mg a day.  He returns for followup today.  He denies chest pain or syncope.  His palpitations are stable.  He denies any significant change in his shortness of breath.  Past Medical History  Diagnosis Date  . Old myocardial infarction   . Unspecified essential hypertension   . Personal history of other diseases of digestive system   . Personal history of malignant melanoma of skin   . Dysthymic disorder   . Anemia, unspecified   . Thoracic or lumbosacral neuritis or radiculitis, unspecified   . Impotence of organic origin   . Malignant melanoma of skin of upper limb, including shoulder   . Bladder neck obstruction   . Coronary atherosclerosis of unspecified type of vessel, native or graft     Current Outpatient  Prescriptions  Medication Sig Dispense Refill  . aspirin 81 MG EC tablet Take 81 mg by mouth daily.        . budesonide-formoterol (SYMBICORT) 160-4.5 MCG/ACT inhaler Inhale 2 puffs into the lungs 2 (two) times daily.  3 Inhaler  3  . finasteride (PROSCAR) 5 MG tablet TAKE 1 TABLET DAILY  90 tablet  0  . metoprolol (TOPROL-XL) 100 MG 24 hr tablet Take 100 mg by mouth daily.        . nitroGLYCERIN (NITROSTAT) 0.4 MG SL tablet Place 0.4 mg under the tongue as directed.        . nortriptyline (PAMELOR) 10 MG capsule Take 20 mg by mouth at bedtime.        Marland Kitchen omeprazole (PRILOSEC) 20 MG capsule Take 20 mg by mouth daily.        . rosuvastatin (CRESTOR) 10 MG tablet Take 1 tablet (10 mg total) by mouth daily.  90 tablet  3  . sertraline (ZOLOFT) 50 MG tablet Take 50 mg by mouth daily.        . vitamin B-12 (CYANOCOBALAMIN) 1000 MCG tablet Take 1 tablet (1,000 mcg total) by mouth daily.  100 tablet  3  . DISCONTD: metoprolol (TOPROL-XL) 50 MG 24 hr tablet Take 100 mg by mouth daily.       Marland Kitchen DISCONTD: zolpidem (AMBIEN) 10 MG tablet  Take 10 mg by mouth at bedtime as needed.          Allergies: Allergies  Allergen Reactions  . Ciprofloxacin   . Iohexol      Code: HIVES, Desc: PATENT STATES HE IS ALLERGIC TO IV DYE 09/14/08/RM, Onset Date: [id number]     Vital Signs: BP 112/70  Pulse 60  Ht 5\' 10"  (1.778 m)  Wt 178 lb 12.8 oz (81.103 kg)  BMI 25.66 kg/m2  PHYSICAL EXAM: Well nourished, well developed, in no acute distress HEENT: normal Neck: no JVD Cardiac:  normal S1, S2; RRR; no murmur Lungs:  clear to auscultation bilaterally, no wheezing, rhonchi or rales Abd: soft, nontender, no hepatomegaly Ext: no edema Skin: warm and dry Neuro:  CNs 2-12 intact, no focal abnormalities noted  EKG:  Sinus rhythm, heart rate 60, normal axis, poor R-wave progression, nonspecific ST-T wave changes  ASSESSMENT AND PLAN:

## 2010-10-09 NOTE — Assessment & Plan Note (Signed)
Controlled.  Continue current therapy.  

## 2010-10-09 NOTE — Patient Instructions (Signed)
Your physician recommends that you schedule a follow-up appointment in: 4 WEEKS WITH DR. WALL AS PER SCOTT WEAVER, PA-C  Your physician has recommended that you wear a 48 HOUR holter monitor 785.1 . Holter monitors are medical devices that record the heart's electrical activity. Doctors most often use these monitors to diagnose arrhythmias. Arrhythmias are problems with the speed or rhythm of the heartbeat. The monitor is a small, portable device. You can wear one while you do your normal daily activities. This is usually used to diagnose what is causing palpitations/syncope (passing out).

## 2010-10-22 ENCOUNTER — Encounter (INDEPENDENT_AMBULATORY_CARE_PROVIDER_SITE_OTHER): Payer: Medicare Other

## 2010-10-22 DIAGNOSIS — R002 Palpitations: Secondary | ICD-10-CM

## 2010-10-29 ENCOUNTER — Telehealth: Payer: Self-pay | Admitting: *Deleted

## 2010-10-29 NOTE — Telephone Encounter (Signed)
Pt aware of holter monitor results and reassured.   Mylo Red RN

## 2010-11-20 ENCOUNTER — Encounter: Payer: Self-pay | Admitting: Cardiology

## 2010-11-23 ENCOUNTER — Ambulatory Visit: Payer: Medicare Other | Admitting: Cardiology

## 2010-12-03 ENCOUNTER — Encounter: Payer: Self-pay | Admitting: Cardiology

## 2010-12-14 ENCOUNTER — Encounter: Payer: Self-pay | Admitting: Cardiology

## 2010-12-14 ENCOUNTER — Ambulatory Visit (INDEPENDENT_AMBULATORY_CARE_PROVIDER_SITE_OTHER): Payer: Medicare Other | Admitting: Cardiology

## 2010-12-14 DIAGNOSIS — I2510 Atherosclerotic heart disease of native coronary artery without angina pectoris: Secondary | ICD-10-CM

## 2010-12-14 DIAGNOSIS — R002 Palpitations: Secondary | ICD-10-CM

## 2010-12-14 DIAGNOSIS — R499 Unspecified voice and resonance disorder: Secondary | ICD-10-CM

## 2010-12-14 DIAGNOSIS — I252 Old myocardial infarction: Secondary | ICD-10-CM

## 2010-12-14 DIAGNOSIS — R498 Other voice and resonance disorders: Secondary | ICD-10-CM

## 2010-12-14 NOTE — Assessment & Plan Note (Signed)
Stable. Continue medical therapy 

## 2010-12-14 NOTE — Assessment & Plan Note (Signed)
Improved on increased beta blocker. I reviewed his Holter monitor, EKG, echocardiogram, and blood work with reassurance that these are benign. Followup in one year.

## 2010-12-14 NOTE — Patient Instructions (Signed)
You have been referred to Dr. Narda Bonds  (ENT)  Your physician recommends that you schedule a follow-up appointment in: 1 year with Dr. Daleen Squibb

## 2010-12-14 NOTE — Progress Notes (Signed)
HPI [name] returns today with symptomatically he sees and palpitations.  Please see the previous note by Troy Combs.  They have improved on increased beta blocker. His Holter monitor showed only unifocal PVCs which were fairly infrequent. He did have some ventricular bigeminy. Echo showed normal left ventricular systolic function. Potassium was normal. Hemoglobin was normal. Chest x-ray showed no acute cardiopulmonary disease  He also complains of some difficulty projecting his voice. He denies any problems swallowing. He has not had hoarseness. He would like referral to an ENT. Past Medical History  Diagnosis Date  . Old myocardial infarction   . Unspecified essential hypertension   . Personal history of other diseases of digestive system   . Personal history of malignant melanoma of skin   . Dysthymic disorder   . Anemia, unspecified   . Thoracic or lumbosacral neuritis or radiculitis, unspecified   . Impotence of organic origin   . Malignant melanoma of skin of upper limb, including shoulder   . Bladder neck obstruction   . Coronary atherosclerosis of unspecified type of vessel, native or graft     Past Surgical History  Procedure Date  . Arthrodesis 07/06/2002    of left long finger distal interphalangeal joint with Kirschner wire fixation X 3  . Coronary artery bypass graft 10/17/2000    Troy Combs. Troy Sage, MD  . Atrial ablation surgery 05/04/2001    Dr. Lewayne Combs  . Coronary angioplasty 1993  . Hernia repair     BILATERAL  . Cholecystectomy   . Exploratory laparotomy     AFTER LIVER TRAUMA AND HAND SURGERY  . Cardiac catheterization 09/05/2008    Revealing 4 of 4 patent grafts with native multivessel coronary artery disease, EF  of 60% without regional Troy Combs motion abnormalities.    Family History  Problem Relation Age of Onset  . Heart disease Father     History   Social History  . Marital Status: Married    Spouse Name: N/A    Number of Children: N/A  .  Years of Education: N/A   Occupational History  . Not on file.   Social History Main Topics  . Smoking status: Former Smoker    Quit date: 09/21/1955  . Smokeless tobacco: Not on file  . Alcohol Use: Not on file  . Drug Use: Not on file  . Sexually Active: Not on file   Other Topics Concern  . Not on file   Social History Narrative  . No narrative on file    Allergies  Allergen Reactions  . Ciprofloxacin   . Iohexol      Code: HIVES, Desc: PATENT STATES HE IS ALLERGIC TO IV DYE 09/14/08/RM, Onset Date: 16109604     Current Outpatient Prescriptions  Medication Sig Dispense Refill  . aspirin 81 MG EC tablet Take 81 mg by mouth Combs.        . budesonide-formoterol (SYMBICORT) 160-4.5 MCG/ACT inhaler Inhale 2 puffs into the lungs 2 (two) times Combs.  3 Inhaler  3  . finasteride (PROSCAR) 5 MG tablet TAKE 1 TABLET Combs  90 tablet  0  . metoprolol (TOPROL-XL) 100 MG 24 hr tablet Take 100 mg by mouth Combs.        . nitroGLYCERIN (NITROSTAT) 0.4 MG SL tablet Place 0.4 mg under the tongue as directed.        . nortriptyline (PAMELOR) 10 MG capsule Take 20 mg by mouth at bedtime.        Marland Kitchen omeprazole (PRILOSEC)  20 MG capsule Take 20 mg by mouth Combs.        . rosuvastatin (CRESTOR) 10 MG tablet Take 1 tablet (10 mg total) by mouth Combs.  90 tablet  3  . sertraline (ZOLOFT) 50 MG tablet Take 50 mg by mouth Combs.        . vitamin B-12 (CYANOCOBALAMIN) 1000 MCG tablet Take 1 tablet (1,000 mcg total) by mouth Combs.  100 tablet  3    ROS Negative other than HPI.   PE General Appearance: well developed, well nourished in no acute distress HEENT: symmetrical face, PERRLA, good dentition  Neck: no JVD, thyromegaly, or adenopathy, trachea midline Chest: symmetric without deformity Cardiac: PMI non-displaced, RRR, normal S1, S2, no gallop or murmur Lung: clear to ausculation and percussion Vascular: all pulses full without bruits  Abdominal: nondistended, nontender, good bowel  sounds, no HSM, no bruits Extremities: no cyanosis, clubbing or edema, no sign of DVT, no varicosities  Skin: normal color, no rashes Neuro: alert and oriented x 3, non-focal Pysch: normal affect Filed Vitals:   12/14/10 1607  BP: 118/74  Pulse: 85  Height: 5\' 9"  (1.753 m)  Weight: 181 lb (82.101 kg)    EKG  Labs and Studies Reviewed.   Lab Results  Component Value Date   WBC 9.0 10/01/2010   HGB 15.1 10/01/2010   HCT 42.8 10/01/2010   MCV 84.1 10/01/2010   PLT 140* 10/01/2010      Chemistry      Component Value Date/Time   NA 137 10/01/2010 0414   K 4.3 10/01/2010 0414   CL 102 10/01/2010 0414   CO2 24 10/01/2010 0414   BUN 17 10/01/2010 0414   CREATININE 0.82 10/01/2010 0414      Component Value Date/Time   CALCIUM 9.3 10/01/2010 0414   ALKPHOS 45 08/13/2010 1725   AST 23 08/13/2010 1725   ALT 29 08/13/2010 1725   BILITOT 1.1 08/13/2010 1725       Lab Results  Component Value Date   CHOL 111 05/15/2009   CHOL  Value: 119        ATP III CLASSIFICATION:  <200     mg/dL   Desirable  960-454  mg/dL   Borderline High  >=098    mg/dL   High        05/17/1476   CHOL 127 03/02/2008   Lab Results  Component Value Date   HDL 43.60 05/15/2009   HDL 42 09/05/2008   HDL 45.2 03/02/2008   Lab Results  Component Value Date   LDLCALC 48 05/15/2009   LDLCALC  Value: 67        Total Cholesterol/HDL:CHD Risk Coronary Heart Disease Risk Table                     Men   Women  1/2 Average Risk   3.4   3.3  Average Risk       5.0   4.4  2 X Average Risk   9.6   7.1  3 X Average Risk  23.4   11.0        Use the calculated Patient Ratio above and the CHD Risk Table to determine the patient's CHD Risk.        ATP III CLASSIFICATION (LDL):  <100     mg/dL   Optimal  295-621  mg/dL   Near or Above  Optimal  130-159  mg/dL   Borderline  981-191  mg/dL   High  >478     mg/dL   Very High 2/95/6213   LDLCALC 65 03/02/2008   Lab Results  Component Value Date   TRIG 98.0 05/15/2009   TRIG 51  09/05/2008   TRIG 84 03/02/2008   Lab Results  Component Value Date   CHOLHDL 3 05/15/2009   CHOLHDL 2.8 09/05/2008   CHOLHDL 2.8 CALC 03/02/2008   Lab Results  Component Value Date   HGBA1C  Value: 5.8 (NOTE) The ADA recommends the following therapeutic goal for glycemic control related to Hgb A1c measurement: Goal of therapy: <6.5 Hgb A1c  Reference: American Diabetes Association: Clinical Practice Recommendations 2010, Diabetes Care, 2010, 33: (Suppl  1). 09/04/2008   Lab Results  Component Value Date   ALT 29 08/13/2010   AST 23 08/13/2010   ALKPHOS 45 08/13/2010   BILITOT 1.1 08/13/2010   Lab Results  Component Value Date   TSH 1.46 08/13/2010

## 2011-07-10 ENCOUNTER — Other Ambulatory Visit: Payer: Self-pay | Admitting: Internal Medicine

## 2011-09-11 ENCOUNTER — Ambulatory Visit (INDEPENDENT_AMBULATORY_CARE_PROVIDER_SITE_OTHER): Payer: Medicare Other | Admitting: Family Medicine

## 2011-09-11 VITALS — BP 122/70 | HR 64 | Temp 98.0°F | Resp 18 | Ht 69.0 in | Wt 179.0 lb

## 2011-09-11 DIAGNOSIS — L03019 Cellulitis of unspecified finger: Secondary | ICD-10-CM

## 2011-09-11 DIAGNOSIS — IMO0002 Reserved for concepts with insufficient information to code with codable children: Secondary | ICD-10-CM

## 2011-09-11 MED ORDER — CEPHALEXIN 500 MG PO CAPS: 1000.0000 mg | ORAL_CAPSULE | Freq: Two times a day (BID) | ORAL | Status: AC

## 2011-09-11 NOTE — Progress Notes (Signed)
Patient Name: Troy Combs Date of Birth: [birth year] Medical Record Number: [id number] Gender: male Date of Encounter: 09/11/2011  History of Present Illness:  Troy Combs is a 78 y.o. very pleasant male patient who presents with the following:  Noted a paronychia on his left thumb a couple of weeks ago- it drained on its own, but then he developed an area of "extra skin" over the nail which is concerning him.  It is tender to the touch.  He has a distant history of melanoma so he does have a dermatologist whom he can see if needed.   Patient Active Problem List  Diagnoses  . MELANOMA, SHOULDER  . HYPERLIPIDEMIA TYPE IIB / III  . UNSPECIFIED ANEMIA  . ANXIETY DEPRESSION  . HYPERTENSION  . OLD MYOCARDIAL INFARCTION  . CORONARY ARTERY DISEASE  . ORTHOSTATIC HYPOTENSION  . HYPOTENSION, UNSPECIFIED  . OBSTRUCTION, BLADDER NECK  . ERECTILE DYSFUNCTION, ORGANIC  . LUMBAR RADICULOPATHY, LEFT  . DIZZINESS  . SHORTNESS OF BREATH  . CHEST PAIN UNSPECIFIED  . MELANOMA, HX OF  . GASTROESOPHAGEAL REFLUX DISEASE, HX OF  . B12 deficiency  . Hearing loss  . Tinnitus  . Palpitations   Past Medical History  Diagnosis Date  . Old myocardial infarction   . Unspecified essential hypertension   . Personal history of other diseases of digestive system   . Personal history of malignant melanoma of skin   . Dysthymic disorder   . Anemia, unspecified   . Thoracic or lumbosacral neuritis or radiculitis, unspecified   . Impotence of organic origin   . Malignant melanoma of skin of upper limb, including shoulder   . Bladder neck obstruction   . Coronary atherosclerosis of unspecified type of vessel, native or graft    Past Surgical History  Procedure Date  . Arthrodesis 07/06/2002    of left long finger distal interphalangeal joint with Kirschner wire fixation X 3  . Coronary artery bypass graft 10/17/2000    Gwenith Daily. Tyrone Sage, MD  . Atrial ablation surgery 05/04/2001    Dr.  Lewayne Bunting  . Coronary angioplasty 1993  . Hernia repair     BILATERAL  . Cholecystectomy   . Exploratory laparotomy     AFTER LIVER TRAUMA AND HAND SURGERY  . Cardiac catheterization 09/05/2008    Revealing 4 of 4 patent grafts with native multivessel coronary artery disease, EF  of 60% without regional wall motion abnormalities.   History  Substance Use Topics  . Smoking status: Former Smoker    Quit date: 09/21/1955  . Smokeless tobacco: Not on file  . Alcohol Use: Not on file   Family History  Problem Relation Age of Onset  . Heart disease Father    Allergies  Allergen Reactions  . Ciprofloxacin   . Iohexol      Code: HIVES, Desc: PATENT STATES HE IS ALLERGIC TO IV DYE 09/14/08/RM, Onset Date: 29528413     Medication list has been reviewed and updated.  Review of Systems: As per HPI- otherwise negative.   Physical Examination: Filed Vitals:   09/11/11 1043  BP: 122/70  Pulse: 64  Temp: 98 F (36.7 C)  TempSrc: Oral  Resp: 18  Height: 5\' 9"  (1.753 m)  Weight: 179 lb (81.194 kg)    Body mass index is 26.43 kg/(m^2).   GEN: WDWN, NAD, Non-toxic, Alert & Oriented x 3 HEENT: Atraumatic, Normocephalic.  Ears and Nose: No external deformity. EXTR: No clubbing/cyanosis/edema NEURO: Normal gait.  PSYCH: Normally  interactive. Conversant. Not depressed or anxious appearing.  Calm demeanor.  Left thumb: shows a lateral paronychia, resolving, and an area of "proud flesh" which protrudes over the nail.  About half the size of a pencil eraser and tender/ friable  Assessment and Plan:  1. Paronychia  cephALEXin (KEFLEX) 500 MG capsule   Suspect a paronychia in healing stages.  Cover with keflex as above.  However, if skin change described above does not resolve with healing we should take it off and send for pathology.  He will see either [place] or his dermatologist if this problem does not resolve in a week or so- Sooner if worse.

## 2011-10-24 ENCOUNTER — Telehealth: Payer: Self-pay | Admitting: Cardiology

## 2011-10-24 ENCOUNTER — Other Ambulatory Visit: Payer: Self-pay | Admitting: Internal Medicine

## 2011-10-24 NOTE — Telephone Encounter (Signed)
error 

## 2011-10-24 NOTE — Telephone Encounter (Signed)
New msg Pt wants refill of crestor to be called to [facility]

## 2011-10-30 ENCOUNTER — Other Ambulatory Visit: Payer: Self-pay | Admitting: Cardiology

## 2011-11-01 NOTE — Telephone Encounter (Signed)
Pt needs appointment then refill can be made Fax Received. Refill Completed. Dyneisha Murchison Chowoe (R.M.A)   

## 2011-12-23 ENCOUNTER — Encounter: Payer: Self-pay | Admitting: Cardiology

## 2011-12-23 ENCOUNTER — Ambulatory Visit (INDEPENDENT_AMBULATORY_CARE_PROVIDER_SITE_OTHER): Payer: Medicare Other | Admitting: Cardiology

## 2011-12-23 VITALS — BP 132/74 | HR 67 | Ht 69.0 in | Wt 179.0 lb

## 2011-12-23 DIAGNOSIS — I10 Essential (primary) hypertension: Secondary | ICD-10-CM

## 2011-12-23 DIAGNOSIS — I2510 Atherosclerotic heart disease of native coronary artery without angina pectoris: Secondary | ICD-10-CM

## 2011-12-23 DIAGNOSIS — R0602 Shortness of breath: Secondary | ICD-10-CM

## 2011-12-23 DIAGNOSIS — R002 Palpitations: Secondary | ICD-10-CM

## 2011-12-23 DIAGNOSIS — I252 Old myocardial infarction: Secondary | ICD-10-CM

## 2011-12-23 NOTE — Assessment & Plan Note (Signed)
Stable. Continue secondary therapy. Return the office in one year.

## 2011-12-23 NOTE — Patient Instructions (Addendum)
Your physician wants you to follow-up in: 1 year. You will receive a reminder letter in the mail two months in advance. If you don't receive a letter, please call our office to schedule the follow-up appointment.  

## 2011-12-23 NOTE — Progress Notes (Signed)
HPI Troy Combs returns for evaluation and management of his history of coronary artery disease, history of MI, and cardiac risk factors.  Other than getting older, has no complaints. He still very active and denies any symptoms of ischemia or angina. His last echocardiogram and nuclear study showed no air infarction. He has normal left ventricular systolic function.Marland Kitchen He's very compliant with medications.  Past Medical History  Diagnosis Date  . Old myocardial infarction   . Unspecified essential hypertension   . Personal history of other diseases of digestive system   . Personal history of malignant melanoma of skin   . Dysthymic disorder   . Anemia, unspecified   . Thoracic or lumbosacral neuritis or radiculitis, unspecified   . Impotence of organic origin   . Malignant melanoma of skin of upper limb, including shoulder   . Bladder neck obstruction   . Coronary atherosclerosis of unspecified type of vessel, native or graft     Current Outpatient Prescriptions  Medication Sig Dispense Refill  . budesonide-formoterol (SYMBICORT) 160-4.5 MCG/ACT inhaler Inhale 2 puffs into the lungs 2 (two) times daily.      . CRESTOR 10 MG tablet TAKE 1 TABLET DAILY.  30 each  1  . finasteride (PROSCAR) 5 MG tablet TAKE 1 TABLET DAILY  90 tablet  0  . metoprolol (TOPROL-XL) 100 MG 24 hr tablet Take 100 mg by mouth daily.        . nitroGLYCERIN (NITROSTAT) 0.4 MG SL tablet Place 0.4 mg under the tongue as directed.        . nortriptyline (PAMELOR) 10 MG capsule Take 20 mg by mouth at bedtime.        Marland Kitchen omeprazole (PRILOSEC) 20 MG capsule Take 20 mg by mouth daily.        . sertraline (ZOLOFT) 50 MG tablet Take 50 mg by mouth daily.        Marland Kitchen DISCONTD: budesonide-formoterol (SYMBICORT) 160-4.5 MCG/ACT inhaler Inhale 2 puffs into the lungs 2 (two) times daily.  3 Inhaler  3    Allergies  Allergen Reactions  . Ciprofloxacin   . Iohexol      Code: HIVES, Desc: PATENT STATES HE IS ALLERGIC TO IV DYE  09/14/08/RM, Onset Date: 16109604     Family History  Problem Relation Age of Onset  . Heart disease Father     History   Social History  . Marital Status: Married    Spouse Name: N/A    Number of Children: N/A  . Years of Education: N/A   Occupational History  . Not on file.   Social History Main Topics  . Smoking status: Former Smoker    Quit date: 09/21/1955  . Smokeless tobacco: Not on file  . Alcohol Use: Not on file  . Drug Use: Not on file  . Sexually Active: Not on file   Other Topics Concern  . Not on file   Social History Narrative  . No narrative on file    ROS ALL NEGATIVE EXCEPT THOSE NOTED IN HPI  PE  General Appearance: well developed, well nourished in no acute distress HEENT: symmetrical face, PERRLA, good dentition  Neck: no JVD, thyromegaly, or adenopathy, trachea midline Chest: symmetric without deformity Cardiac: PMI non-displaced, RRR, normal S1, S2, no gallop or murmur Lung: clear to ausculation and percussion Vascular: all pulses full without bruits  Abdominal: nondistended, nontender, good bowel sounds, no HSM, no bruits Extremities: no cyanosis, clubbing or edema, no sign of DVT, no varicosities  Skin: normal color, no rashes Neuro: alert and oriented x 3, non-focal Pysch: normal affect  EKG Normal sinus rhythm, normal EKG BMET    Component Value Date/Time   NA 137 10/01/2010 0414   K 4.3 10/01/2010 0414   CL 102 10/01/2010 0414   CO2 24 10/01/2010 0414   GLUCOSE 132* 10/01/2010 0414   GLUCOSE 113* 03/18/2006 0839   BUN 17 10/01/2010 0414   CREATININE 0.82 10/01/2010 0414   CALCIUM 9.3 10/01/2010 0414   GFRNONAA >60 10/01/2010 0414   GFRAA  Value: >60        The eGFR has been calculated using the MDRD equation. This calculation has not been validated in all clinical situations. eGFR's persistently <60 mL/min signify possible Chronic Kidney Disease. 10/01/2010 0414    Lipid Panel     Component Value Date/Time   CHOL 111 05/15/2009 1051    TRIG 98.0 05/15/2009 1051   HDL 43.60 05/15/2009 1051   CHOLHDL 3 05/15/2009 1051   VLDL 19.6 05/15/2009 1051   LDLCALC 48 05/15/2009 1051    CBC    Component Value Date/Time   WBC 9.0 10/01/2010 0414   RBC 5.09 10/01/2010 0414   HGB 15.1 10/01/2010 0414   HCT 42.8 10/01/2010 0414   PLT 140* 10/01/2010 0414   MCV 84.1 10/01/2010 0414   MCH 29.7 10/01/2010 0414   MCHC 35.3 10/01/2010 0414   RDW 13.1 10/01/2010 0414   LYMPHSABS 0.6* 10/01/2010 0414   MONOABS 0.4 10/01/2010 0414   EOSABS 0.0 10/01/2010 0414   BASOSABS 0.0 10/01/2010 0414

## 2012-01-26 ENCOUNTER — Other Ambulatory Visit: Payer: Self-pay | Admitting: Internal Medicine

## 2012-01-30 ENCOUNTER — Telehealth: Payer: Self-pay | Admitting: Cardiology

## 2012-01-30 MED ORDER — ROSUVASTATIN CALCIUM 10 MG PO TABS: 10.0000 mg | ORAL_TABLET | Freq: Every day | ORAL | Status: DC

## 2012-01-30 NOTE — Telephone Encounter (Signed)
Pt calls for refill on Crestor. Refill sent in. Samples placed at front desk as pt has 2 pills left. Mylo Red RN

## 2012-01-30 NOTE — Telephone Encounter (Signed)
plz return call to patient [phone] regarding medication issues.

## 2012-02-05 ENCOUNTER — Telehealth: Payer: Self-pay | Admitting: Cardiology

## 2012-02-05 NOTE — Telephone Encounter (Signed)
Pt needs refill of crestor ref # [id number] [facility] [phone]

## 2012-02-05 NOTE — Telephone Encounter (Signed)
Left message for patient to call me back at the [facility] office [phone].

## 2012-03-31 ENCOUNTER — Other Ambulatory Visit: Payer: Self-pay | Admitting: *Deleted

## 2012-03-31 ENCOUNTER — Telehealth: Payer: Self-pay | Admitting: Cardiology

## 2012-03-31 MED ORDER — METOPROLOL SUCCINATE ER 100 MG PO TB24: 100.0000 mg | ORAL_TABLET | Freq: Every day | ORAL | Status: DC

## 2012-03-31 NOTE — Telephone Encounter (Signed)
Pt needs refill of [facility]rolol [facility], [facility], pt out a 1 month, requested refill and said it was never called in

## 2012-08-17 ENCOUNTER — Telehealth: Payer: Self-pay | Admitting: Internal Medicine

## 2012-08-17 NOTE — Telephone Encounter (Signed)
Amber a nurse with [employer] is calling to get the most recent Hbg A1C we have on file if we have one, she can be reached at [phone] ext [phone]

## 2012-08-18 NOTE — Telephone Encounter (Signed)
Pt is not diabetic therefore we do not have a recent Hgb A1c. I left a detailed Science writer at [facility] of this.

## 2012-10-29 ENCOUNTER — Telehealth: Payer: Self-pay | Admitting: Internal Medicine

## 2012-10-29 NOTE — Telephone Encounter (Signed)
Wilson Singer, and RN with [facility] is requesting an A1C and fasting blood gloucose results.

## 2012-11-04 NOTE — Telephone Encounter (Signed)
We haven't seen patient in 2 years and have no recent labs on him. I called Amber at [facility] and left a detailed mess informing her of this.

## 2012-11-16 ENCOUNTER — Telehealth: Payer: Self-pay | Admitting: Internal Medicine

## 2012-11-16 NOTE — Telephone Encounter (Signed)
Ok with me 

## 2012-11-16 NOTE — Telephone Encounter (Signed)
Pt wants to switch PCP from Dr. Posey Rea to Dr. Jonny Ruiz.  [employer].

## 2012-11-16 NOTE — Telephone Encounter (Signed)
OK w/me Thx 

## 2012-11-17 NOTE — Telephone Encounter (Signed)
Left msg with wife for him to call.

## 2012-11-18 NOTE — Telephone Encounter (Signed)
Pt is aware and has made an appt for Aug 6 with Dr. Jonny Ruiz.

## 2012-12-02 ENCOUNTER — Ambulatory Visit (INDEPENDENT_AMBULATORY_CARE_PROVIDER_SITE_OTHER)
Admission: RE | Admit: 2012-12-02 | Discharge: 2012-12-02 | Disposition: A | Discharge status: Home / Self Care | Payer: Medicare Other | Source: Ambulatory Visit | Attending: Internal Medicine | Admitting: Internal Medicine

## 2012-12-02 ENCOUNTER — Ambulatory Visit (INDEPENDENT_AMBULATORY_CARE_PROVIDER_SITE_OTHER): Payer: Medicare Other | Admitting: Internal Medicine

## 2012-12-02 ENCOUNTER — Encounter: Payer: Self-pay | Admitting: Internal Medicine

## 2012-12-02 ENCOUNTER — Other Ambulatory Visit (INDEPENDENT_AMBULATORY_CARE_PROVIDER_SITE_OTHER): Payer: Medicare Other

## 2012-12-02 VITALS — BP 130/84 | HR 62 | Temp 97.3°F | Ht 70.0 in | Wt 179.2 lb

## 2012-12-02 DIAGNOSIS — M545 Low back pain, unspecified: Secondary | ICD-10-CM

## 2012-12-02 DIAGNOSIS — R7302 Impaired glucose tolerance (oral): Secondary | ICD-10-CM | POA: Insufficient documentation

## 2012-12-02 DIAGNOSIS — Z0000 Encounter for general adult medical examination without abnormal findings: Secondary | ICD-10-CM

## 2012-12-02 DIAGNOSIS — R7309 Other abnormal glucose: Secondary | ICD-10-CM

## 2012-12-02 DIAGNOSIS — E782 Mixed hyperlipidemia: Secondary | ICD-10-CM

## 2012-12-02 DIAGNOSIS — E538 Deficiency of other specified B group vitamins: Secondary | ICD-10-CM

## 2012-12-02 HISTORY — DX: Impaired glucose tolerance (oral): R73.02

## 2012-12-02 LAB — LIPID PANEL
Cholesterol: 107 mg/dL (ref 0–200)
HDL: 41.3 mg/dL (ref >39.00)
LDL Cholesterol: 43 mg/dL (ref 0–99)
Total CHOL/HDL Ratio: 3
Triglycerides: 116 mg/dL (ref 0.0–149.0)
VLDL: 23.2 mg/dL (ref 0.0–40.0)

## 2012-12-02 LAB — CBC WITH DIFFERENTIAL/PLATELET
Basophils Absolute: 0 10*3/uL (ref 0.0–0.1)
Basophils Relative: 0.3 % (ref 0.0–3.0)
Eosinophils Absolute: 0.3 10*3/uL (ref 0.0–0.7)
Eosinophils Relative: 3.4 % (ref 0.0–5.0)
HCT: 45.6 % (ref 39.0–52.0)
Hemoglobin: 15.4 g/dL (ref 13.0–17.0)
Lymphocytes Relative: 23.3 % (ref 12.0–46.0)
Lymphs Abs: 2.2 10*3/uL (ref 0.7–4.0)
MCHC: 33.9 g/dL (ref 30.0–36.0)
MCV: 90.2 fl (ref 78.0–100.0)
Monocytes Absolute: 0.9 10*3/uL (ref 0.1–1.0)
Monocytes Relative: 9.6 % (ref 3.0–12.0)
Neutro Abs: 6 10*3/uL (ref 1.4–7.7)
Neutrophils Relative %: 63.4 % (ref 43.0–77.0)
Platelets: 181 10*3/uL (ref 150.0–400.0)
RBC: 5.05 Mil/uL (ref 4.22–5.81)
RDW: 13.4 % (ref 11.5–14.6)
WBC: 9.4 10*3/uL (ref 4.5–10.5)

## 2012-12-02 LAB — HEPATIC FUNCTION PANEL
ALT: 29 U/L (ref 0–53)
AST: 24 U/L (ref 0–37)
Albumin: 4.3 g/dL (ref 3.5–5.2)
Alkaline Phosphatase: 49 U/L (ref 39–117)
Bilirubin, Direct: 0.2 mg/dL (ref 0.0–0.3)
Total Bilirubin: 0.9 mg/dL (ref 0.3–1.2)
Total Protein: 6.6 g/dL (ref 6.0–8.3)

## 2012-12-02 LAB — URINALYSIS, ROUTINE W REFLEX MICROSCOPIC
Bilirubin Urine: NEGATIVE
Hgb urine dipstick: NEGATIVE
Ketones, ur: NEGATIVE
Leukocytes, UA: NEGATIVE
Nitrite: NEGATIVE
RBC / HPF: NONE SEEN (ref 0–?)
Specific Gravity, Urine: >=1.03 (ref 1.000–1.030)
Total Protein, Urine: NEGATIVE
Urine Glucose: NEGATIVE
Urobilinogen, UA: 1 (ref 0.0–1.0)
pH: 6 (ref 5.0–8.0)

## 2012-12-02 LAB — VITAMIN B12: Vitamin B-12: 312 pg/mL (ref 211–911)

## 2012-12-02 LAB — BASIC METABOLIC PANEL
BUN: 23 mg/dL (ref 6–23)
CO2: 26 mEq/L (ref 19–32)
Calcium: 9.6 mg/dL (ref 8.4–10.5)
Chloride: 107 mEq/L (ref 96–112)
Creatinine, Ser: 0.9 mg/dL (ref 0.4–1.5)
GFR: 91.17 mL/min (ref >60.00)
Glucose, Bld: 117 mg/dL — ABNORMAL HIGH (ref 70–99)
Potassium: 4.8 mEq/L (ref 3.5–5.1)
Sodium: 140 mEq/L (ref 135–145)

## 2012-12-02 LAB — HEMOGLOBIN A1C: Hgb A1c MFr Bld: 6.1 % (ref 4.6–6.5)

## 2012-12-02 LAB — PSA: PSA: 0.48 ng/mL (ref 0.10–4.00)

## 2012-12-02 LAB — TSH: TSH: 1.77 u[IU]/mL (ref 0.35–5.50)

## 2012-12-02 NOTE — Assessment & Plan Note (Signed)

## 2012-12-02 NOTE — Patient Instructions (Signed)
There are no new medicaitons today Please continue all other medications as before, and refills have been done if requested. Please have the pharmacy call with any other refills you may need. Please continue your efforts at being more active, low cholesterol diet, and weight control. You are otherwise up to date with prevention measures today. Please go to the XRAY Department in the Basement (go straight as you get off the elevator) for the x-ray testing Please go to the LAB in the Basement (turn left off the elevator) for the tests to be done today You will be contacted by phone if any changes need to be made immediately.  Otherwise, you will receive a letter about your results with an explanation, but please check with MyChart first.  Please remember to sign up for My Chart if you have not done so, as this will be important to you in the future with finding out test results, communicating by private email, and scheduling acute appointments online when needed.  Please keep your appointments with your specialists as you have planned - opthomology on Monday next wk   Please return in 1 year for your yearly visit, or sooner if needed, with Lab testing done 3-5 days before

## 2012-12-02 NOTE — Progress Notes (Signed)
Subjective:    Patient ID: Troy Combs, male    DOB: August 16, 1931, 79 y.o.   MRN: [id number]  HPI  Here for wellness and f/u;  Overall doing ok;  Pt denies CP, worsening SOB, DOE, wheezing, orthopnea, PND, worsening LE edema, palpitations, dizziness or syncope.  Pt denies neurological change such as new headache, facial or extremity weakness.  Pt denies polydipsia, polyuria, or low sugar symptoms. Pt states overall good compliance with treatment and medications, good tolerability, and has been trying to follow lower cholesterol diet.  Pt denies worsening depressive symptoms, suicidal ideation or panic. No fever, night sweats, wt loss, loss of appetite, or other constitutional symptoms.  Pt states good ability with ADL's, has low fall risk, home safety reviewed and adequate, no other significant changes in hearing or vision, and only occasionally active with exercise.  Has not been taking the B12 for approx 1.5 yrs. Pt continues to have recurring LBP without change in severity, bowel or bladder change, fever, wt loss,  worsening LE pain/numbness/weakness, gait change or falls. Last imaging about 10 yrs ago, plain film, told he had "bulging disc." No clear radicular pain/numb/weakness. Past Medical History  Diagnosis Date  . Old myocardial infarction   . Unspecified essential hypertension   . Personal history of other diseases of digestive system   . Personal history of malignant melanoma of skin   . Dysthymic disorder   . Anemia, unspecified   . Thoracic or lumbosacral neuritis or radiculitis, unspecified   . Impotence of organic origin   . Malignant melanoma of skin of upper limb, including shoulder   . Bladder neck obstruction   . Coronary atherosclerosis of unspecified type of vessel, native or graft   . Impaired glucose tolerance 12/02/2012   Past Surgical History  Procedure Laterality Date  . Arthrodesis  07/06/2002    of left long finger distal interphalangeal joint with Kirschner wire  fixation X 3  . Coronary artery bypass graft  10/17/2000    Gwenith Daily. Tyrone Sage, MD  . Atrial ablation surgery  05/04/2001    Dr. Lewayne Bunting  . Coronary angioplasty  1993  . Hernia repair      BILATERAL  . Cholecystectomy    . Exploratory laparotomy      AFTER LIVER TRAUMA AND HAND SURGERY  . Cardiac catheterization  09/05/2008    Revealing 4 of 4 patent grafts with native multivessel coronary artery disease, EF  of 60% without regional wall motion abnormalities.    reports that he quit smoking about 57 years ago. He does not have any smokeless tobacco history on file. His alcohol and drug histories are not on file. family history includes Heart disease in his father. Allergies  Allergen Reactions  . Ciprofloxacin   . Iohexol      Code: HIVES, Desc: PATENT STATES HE IS ALLERGIC TO IV DYE 09/14/08/RM, Onset Date: [id number]    Current Outpatient Prescriptions on File Prior to Visit  Medication Sig Dispense Refill  . finasteride (PROSCAR) 5 MG tablet TAKE 1 TABLET DAILY  90 tablet  0  . metoprolol succinate (TOPROL-XL) 100 MG 24 hr tablet Take 1 tablet (100 mg total) by mouth daily.  30 tablet  1  . nitroGLYCERIN (NITROSTAT) 0.4 MG SL tablet Place 0.4 mg under the tongue as directed.        . nortriptyline (PAMELOR) 10 MG capsule Take 20 mg by mouth at bedtime.        Marland Kitchen omeprazole (PRILOSEC) 20 MG  capsule Take 20 mg by mouth daily.        . rosuvastatin (CRESTOR) 10 MG tablet Take 1 tablet (10 mg total) by mouth daily.  90 tablet  3  . sertraline (ZOLOFT) 50 MG tablet Take 50 mg by mouth daily.         No current facility-administered medications on file prior to visit.   Review of Systems Constitutional: Negative for diaphoresis, activity change, appetite change or unexpected weight change.  HENT: Negative for hearing loss, ear pain, facial swelling, mouth sores and neck stiffness.   Eyes: Negative for pain, redness and visual disturbance.  Respiratory: Negative for shortness of  breath and wheezing.   Cardiovascular: Negative for chest pain and palpitations.  Gastrointestinal: Negative for diarrhea, blood in stool, abdominal distention or other pain Genitourinary: Negative for hematuria, flank pain or change in urine volume.  Musculoskeletal: Negative for myalgias and joint swelling.  Skin: Negative for color change and wound.  Neurological: Negative for syncope and numbness. other than noted Hematological: Negative for adenopathy.  Psychiatric/Behavioral: Negative for hallucinations, self-injury, decreased concentration and agitation.      Objective:   Physical Exam BP 130/84  Pulse 62  Temp(Src) 97.3 F (36.3 C) (Oral)  Ht 5\' 10"  (1.778 m)  Wt 179 lb 4 oz (81.307 kg)  BMI 25.72 kg/m2  SpO2 98% VS noted,  Constitutional: Pt is oriented to person, place, and time. Appears well-developed and well-nourished.  Head: Normocephalic and atraumatic.  Right Ear: External ear normal.  Left Ear: External ear normal.  Nose: Nose normal.  Mouth/Throat: Oropharynx is clear and moist.  Eyes: Conjunctivae and EOM are normal. Pupils are equal, round, and reactive to light.  Neck: Normal range of motion. Neck supple. No JVD present. No tracheal deviation present.  Cardiovascular: Normal rate, regular rhythm, normal heart sounds and intact distal pulses.   Pulmonary/Chest: Effort normal and breath sounds normal.  Abdominal: Soft. Bowel sounds are normal. There is no tenderness. No HSM  Musculoskeletal: Normal range of motion. Exhibits no edema.  Lymphadenopathy:  Has no cervical adenopathy.  Neurological: Pt is alert and oriented to person, place, and time. Pt has normal reflexes. No cranial nerve deficit. Motor 5/5 Spine nontender Skin: Skin is warm and dry. No rash noted.  Psychiatric:  Has  normal mood and affect. Behavior is normal.     Assessment & Plan:

## 2012-12-02 NOTE — Assessment & Plan Note (Signed)
For b12 today level

## 2012-12-02 NOTE — Assessment & Plan Note (Signed)
For a1c today  

## 2012-12-02 NOTE — Assessment & Plan Note (Signed)
Likely related I suspect to underlying possibly worsening lumbar DJD or DDD, for films since last done approx 10 yrs, declines pain med or ortho referal at this time

## 2012-12-09 ENCOUNTER — Other Ambulatory Visit: Payer: Self-pay | Admitting: Cardiology

## 2012-12-10 ENCOUNTER — Telehealth: Payer: Self-pay | Admitting: *Deleted

## 2012-12-10 MED ORDER — NORTRIPTYLINE HCL 10 MG PO CAPS: 20.0000 mg | ORAL_CAPSULE | Freq: Every day | ORAL | Status: DC

## 2012-12-10 NOTE — Telephone Encounter (Signed)
Done erx to [employer]

## 2012-12-10 NOTE — Telephone Encounter (Signed)
Patient informed. 

## 2012-12-10 NOTE — Telephone Encounter (Signed)
Pt called requesting refill on Nortriptyline.  Last OV 8.6.2014.  Please advise

## 2012-12-17 DIAGNOSIS — Z961 Presence of intraocular lens: Secondary | ICD-10-CM | POA: Insufficient documentation

## 2012-12-17 DIAGNOSIS — H35379 Puckering of macula, unspecified eye: Secondary | ICD-10-CM | POA: Insufficient documentation

## 2012-12-18 ENCOUNTER — Ambulatory Visit (INDEPENDENT_AMBULATORY_CARE_PROVIDER_SITE_OTHER): Payer: Medicare Other | Admitting: Internal Medicine

## 2012-12-18 ENCOUNTER — Encounter: Payer: Self-pay | Admitting: Internal Medicine

## 2012-12-18 ENCOUNTER — Telehealth: Payer: Self-pay | Admitting: Internal Medicine

## 2012-12-18 ENCOUNTER — Ambulatory Visit (INDEPENDENT_AMBULATORY_CARE_PROVIDER_SITE_OTHER)
Admission: RE | Admit: 2012-12-18 | Discharge: 2012-12-18 | Disposition: A | Discharge status: Home / Self Care | Payer: Medicare Other | Source: Ambulatory Visit | Attending: Internal Medicine | Admitting: Internal Medicine

## 2012-12-18 VITALS — BP 132/70 | HR 67 | Temp 97.7°F | Ht 70.0 in | Wt 180.0 lb

## 2012-12-18 DIAGNOSIS — R519 Headache, unspecified: Secondary | ICD-10-CM | POA: Insufficient documentation

## 2012-12-18 DIAGNOSIS — M542 Cervicalgia: Secondary | ICD-10-CM

## 2012-12-18 DIAGNOSIS — H5330 Unspecified disorder of binocular vision: Secondary | ICD-10-CM | POA: Insufficient documentation

## 2012-12-18 DIAGNOSIS — R51 Headache: Secondary | ICD-10-CM

## 2012-12-18 DIAGNOSIS — H539 Unspecified visual disturbance: Secondary | ICD-10-CM

## 2012-12-18 MED ORDER — TRAMADOL HCL 50 MG PO TABS: 50.0000 mg | ORAL_TABLET | Freq: Four times a day (QID) | ORAL | Status: DC | PRN

## 2012-12-18 MED ORDER — CYCLOBENZAPRINE HCL 5 MG PO TABS: 5.0000 mg | ORAL_TABLET | Freq: Three times a day (TID) | ORAL | Status: DC | PRN

## 2012-12-18 MED ORDER — PREDNISONE 10 MG PO TABS: ORAL_TABLET | ORAL | Status: DC

## 2012-12-18 NOTE — Telephone Encounter (Signed)
Meds sent to [facility] as requested

## 2012-12-18 NOTE — Patient Instructions (Addendum)
Please take all new medication as prescribed - the pain medication, muscle relaxer and prednisone You may want to hold on taking the pain medication (tramadol) on the day of your trip to [place] later this week Please continue all other medications as before, and refills have been done if requested. Please go to the XRAY Department in the Basement (go straight as you get off the elevator) for the x-ray testing - just the neck xrays You will be contacted by phone if any changes need to be made immediately.  Otherwise, you will receive a letter about your results with an explanation, but please check with MyChart first.  Please remember to sign up for My Chart if you have not done so, as this will be important to you in the future with finding out test results, communicating by private email, and scheduling acute appointments online when needed.  You will be contacted regarding the referral for: neurology (records were reviewed and concern noted by opthomology about recurring vision change and HA not explained by eye exam) -      Robin to let pt know -

## 2012-12-18 NOTE — Progress Notes (Signed)
Subjective:    Patient ID: Troy Combs, male    DOB: 1931/09/01, 79 y.o.   MRN: [id number]  HPI  Here with wife, c/o sharp and burgnin post head and neck pain, worse in past 3 days, mod to severe, starts at upper mid cervical/low occipital "crease" and radiates bilat to ears.  Has some grinding feeling at baseline with turning head horizontally, some worse with this pain as well.  No other radaition or pain to head or extremities or lower cervical.  Pt denies new neurological symptoms such as new headache, or facial or extremity weakness or numbness except for the above.  Pt denies chest pain, increased sob or doe, wheezing, orthopnea, PND, increased LE swelling, palpitations, dizziness or syncope.  Did have recent eye exam 2 days ago per Dr Levie Heritage, [facility] optho, with abormal retina, and in context of HA and ? Blurred vision suggested pt ask for neuro referral.  No fever, no recent trauma and  Pt denies fever, wt loss, night sweats, loss of appetite, or other constitutional symptoms  Plans to drive to [place] area in a few days Past Medical History  Diagnosis Date  . Old myocardial infarction   . Unspecified essential hypertension   . Personal history of other diseases of digestive system   . Personal history of malignant melanoma of skin   . Dysthymic disorder   . Anemia, unspecified   . Thoracic or lumbosacral neuritis or radiculitis, unspecified   . Impotence of organic origin   . Malignant melanoma of skin of upper limb, including shoulder   . Bladder neck obstruction   . Coronary atherosclerosis of unspecified type of vessel, native or graft   . Impaired glucose tolerance 12/02/2012   Past Surgical History  Procedure Laterality Date  . Arthrodesis  07/06/2002    of left long finger distal interphalangeal joint with Kirschner wire fixation X 3  . Coronary artery bypass graft  10/17/2000    Gwenith Daily. Tyrone Sage, MD  . Atrial ablation surgery  05/04/2001    Dr. Lewayne Bunting   . Coronary angioplasty  1993  . Hernia repair      BILATERAL  . Cholecystectomy    . Exploratory laparotomy      AFTER LIVER TRAUMA AND HAND SURGERY  . Cardiac catheterization  09/05/2008    Revealing 4 of 4 patent grafts with native multivessel coronary artery disease, EF  of 60% without regional wall motion abnormalities.    reports that he quit smoking about 57 years ago. He does not have any smokeless tobacco history on file. His alcohol and drug histories are not on file. family history includes Heart disease in his father. Allergies  Allergen Reactions  . Ciprofloxacin   . Iohexol      Code: HIVES, Desc: PATENT STATES HE IS ALLERGIC TO IV DYE 09/14/08/RM, Onset Date: [id number]    Current Outpatient Prescriptions on File Prior to Visit  Medication Sig Dispense Refill  . aspirin 81 MG tablet Take 81 mg by mouth daily.      . finasteride (PROSCAR) 5 MG tablet TAKE 1 TABLET DAILY  90 tablet  0  . metoprolol succinate (TOPROL-XL) 100 MG 24 hr tablet Take 1 tablet (100 mg total) by mouth daily.  30 tablet  1  . nitroGLYCERIN (NITROSTAT) 0.4 MG SL tablet Place 0.4 mg under the tongue as directed.        . nortriptyline (PAMELOR) 10 MG capsule Take 2 capsules (20 mg total) by mouth  at bedtime.  90 capsule  1  . omeprazole (PRILOSEC) 20 MG capsule Take 20 mg by mouth daily.        . rosuvastatin (CRESTOR) 10 MG tablet Take 1 tablet (10 mg total) by mouth daily.  90 tablet  0  . sertraline (ZOLOFT) 50 MG tablet Take 50 mg by mouth daily.        . tamsulosin (FLOMAX) 0.4 MG CAPS capsule Take 0.4 mg by mouth daily.       No current facility-administered medications on file prior to visit.   Review of Systems  Constitutional: Negative for unexpected weight change, or unusual diaphoresis  HENT: Negative for tinnitus.   Eyes: Negative for photophobia and visual disturbance.  Respiratory: Negative for choking and stridor.   Gastrointestinal: Negative for vomiting and blood in stool.   Genitourinary: Negative for hematuria and decreased urine volume.  Musculoskeletal: Negative for acute joint swelling Skin: Negative for color change and wound.  Neurological: Negative for tremors and numbness other than noted  Psychiatric/Behavioral: Negative for decreased concentration or  hyperactivity.       Objective:   Physical Exam BP 132/70  Pulse 67  Temp(Src) 97.7 F (36.5 C) (Oral)  Ht 5\' 10"  (1.778 m)  Wt 180 lb (81.647 kg)  BMI 25.83 kg/m2  SpO2 96% VS noted, not ill  Constitutional: Pt appears well-developed and well-nourished.  HENT: Head: NCAT.  Right Ear: External ear normal.  Left Ear: External ear normal.  Eyes: Conjunctivae and EOM are normal. Pupils are equal, round, and reactive to light.  Neck: Normal range of motion. Neck stiff with turning left and right horizontal, limited by approx 10 degrees  Spine: nontender, no swelling, redness, rash Cardiovascular: Normal rate and regular rhythm.   Pulmonary/Chest: Effort normal and breath sounds normal.  Neurological: Pt is alert. Not confused , motor 5/5, gait intact Skin: Skin is warm. No erythema. No LE edema Psychiatric: Pt behavior is normal. Thought content normal.     Assessment & Plan:

## 2012-12-18 NOTE — Telephone Encounter (Signed)
Pt needs these medications sent to [facility] instead of [facility], pt is at the pharmacy now

## 2012-12-19 NOTE — Assessment & Plan Note (Signed)
Suspect pain due to flare of underlying probable c spine DJD or DJD; no overt neuro changes due to this, has not been imaged in past - will ask for plain films to confirm, for pain control, predpack asd,  to f/u any worsening symptoms or concerns

## 2012-12-19 NOTE — Assessment & Plan Note (Signed)
Per optho suggestion, for neuro referral

## 2012-12-19 NOTE — Assessment & Plan Note (Signed)
No neuro changes, to hold on imaging for now

## 2012-12-23 ENCOUNTER — Telehealth: Payer: Self-pay | Admitting: *Deleted

## 2012-12-23 MED ORDER — TIZANIDINE HCL 4 MG PO TABS: 4.0000 mg | ORAL_TABLET | Freq: Four times a day (QID) | ORAL | Status: DC | PRN

## 2012-12-23 NOTE — Telephone Encounter (Signed)
Linda from [employer] called states Cyclobenzaprine requires authorization, a substitute that does not require authorization is Tizanidine HCL tabs or caps.  Please advise

## 2012-12-23 NOTE — Telephone Encounter (Signed)
Ok for tizanidine asd - done erx

## 2012-12-23 NOTE — Telephone Encounter (Signed)
Advise on alternative 

## 2013-01-14 ENCOUNTER — Encounter: Payer: Self-pay | Admitting: Neurology

## 2013-01-14 ENCOUNTER — Ambulatory Visit (INDEPENDENT_AMBULATORY_CARE_PROVIDER_SITE_OTHER): Payer: Medicare Other | Admitting: Neurology

## 2013-01-14 VITALS — BP 120/68 | HR 60 | Temp 97.6°F | Ht 70.0 in | Wt 181.0 lb

## 2013-01-14 DIAGNOSIS — G4486 Cervicogenic headache: Secondary | ICD-10-CM

## 2013-01-14 DIAGNOSIS — G43809 Other migraine, not intractable, without status migrainosus: Secondary | ICD-10-CM

## 2013-01-14 DIAGNOSIS — R51 Headache: Secondary | ICD-10-CM

## 2013-01-14 DIAGNOSIS — G43109 Migraine with aura, not intractable, without status migrainosus: Secondary | ICD-10-CM

## 2013-01-14 MED ORDER — MELOXICAM 7.5 MG PO TABS: 7.5000 mg | ORAL_TABLET | Freq: Every day | ORAL | Status: DC

## 2013-01-14 NOTE — Progress Notes (Addendum)
NEUROLOGY CONSULTATION NOTE  Troy Combs MRN: [id number] DOB: Aug 25, 1931  Referring provider: Dr. Jonny Ruiz  Primary care provider: Dr. Jonny Ruiz  Reason for consult:  Headache  HISTORY OF PRESENT ILLNESS: Troy Combs is a 79 year old right-handed man with history of hypertension, CAD, orthostatic hypotension, remote history of melanoma on shoulder, anxiety, and hyperlipidemia who presents for headache, visual change and neck pain.  Records and images were personally reviewed where available.    Symptoms started about 6 to 8 weeks ago.  He describes bilateral headache involving the occipital and suboccipital regions.  It is non-radiating and does not involve the front or temples.  It is a non-throbbing pressure pain.  It can range anywhere from 3 to 7 out of 10.  It is not associated with nausea, photophobia, phonophobia or osmophobia.  He notes very mild unsteadiness but no lightheadedness or vertigo.  He denies focal numbness or weakness or slurred speech or associated visual disturbance.  The usually occur every other day and last several hours.  At first he endorsed that it is not triggered or associated with any type of movement or time of day, but on further questioning, he did not that the headache is worse after prolonged inactivity of his neck (such as waking up in the morning or sitting at his computer for prolonged periods) and is better gradually with neck movement.  Initially, it was associated with non-radiating neck pain, involving bilateral paraspinals, which has since resolved.  When he moves his neck, he notes crunching.  He was prescribed Zanaflex and Ultram, which were not effective and it makes him foggy.  At the time this all started, he had two spells about a week apart.  While watching TV, he noticed that it looked like he was looking through broken glass.  There was no actual vision loss, double vision, or clouding of vision.  No eye pain.  It was present in either eye when  testing separately.  They last 10 minutes and not associated with headache.  He saw Dr. Marvis Repress, an ophthalmologist, who found epiretinal membrane involving the right eye, but no abnormalities of the discs.  Visual fields were full.  Visual acuity was 20/25 OD and 20/30-2 OS.  He has not had another episode in about 6 weeks.  He denies history of headache or family history of headache.  XR Cervical spine (12/18/12): No fracture is noted.  Grade 1 anterolisthesis of C4-5 is noted secondary to posterior facet joint hypertrophy.  Degenerative disc disease is noted at C5-6 and C6-7.  Severe hypertrophy of left- sided facet joint is noted at C4-5 secondary to degenerative joint disease. Mild left-sided neural foraminal stenosis is noted at C5-6 and C6-7 secondary to osteophyte formation  12/02/12 Labs:  Hgb A1c 6.1, LDL 43, TSH 1.77, B12 312.  PAST MEDICAL HISTORY: Past Medical History  Diagnosis Date  . Old myocardial infarction   . Unspecified essential hypertension   . Personal history of other diseases of digestive system   . Personal history of malignant melanoma of skin   . Dysthymic disorder   . Anemia, unspecified   . Thoracic or lumbosacral neuritis or radiculitis, unspecified   . Impotence of organic origin   . Malignant melanoma of skin of upper limb, including shoulder   . Bladder neck obstruction   . Coronary atherosclerosis of unspecified type of vessel, native or graft   . Impaired glucose tolerance 12/02/2012    PAST SURGICAL HISTORY: Past Surgical History  Procedure Laterality Date  . Arthrodesis  07/06/2002    of left long finger distal interphalangeal joint with Kirschner wire fixation X 3  . Coronary artery bypass graft  10/17/2000    Gwenith Daily. Tyrone Sage, MD  . Atrial ablation surgery  05/04/2001    Dr. Lewayne Bunting  . Coronary angioplasty  1993  . Hernia repair      BILATERAL  . Cholecystectomy    . Exploratory laparotomy      AFTER LIVER TRAUMA AND HAND SURGERY   . Cardiac catheterization  09/05/2008    Revealing 4 of 4 patent grafts with native multivessel coronary artery disease, EF  of 60% without regional wall motion abnormalities.    MEDICATIONS: Current Outpatient Prescriptions on File Prior to Visit  Medication Sig Dispense Refill  . aspirin 81 MG tablet Take 81 mg by mouth daily.      . finasteride (PROSCAR) 5 MG tablet TAKE 1 TABLET DAILY  90 tablet  0  . metoprolol succinate (TOPROL-XL) 100 MG 24 hr tablet Take 1 tablet (100 mg total) by mouth daily.  30 tablet  1  . nitroGLYCERIN (NITROSTAT) 0.4 MG SL tablet Place 0.4 mg under the tongue as directed.        . nortriptyline (PAMELOR) 10 MG capsule Take 2 capsules (20 mg total) by mouth at bedtime.  90 capsule  1  . omeprazole (PRILOSEC) 20 MG capsule Take 20 mg by mouth daily.        . rosuvastatin (CRESTOR) 10 MG tablet Take 1 tablet (10 mg total) by mouth daily.  90 tablet  0  . sertraline (ZOLOFT) 50 MG tablet Take 50 mg by mouth daily.        . tamsulosin (FLOMAX) 0.4 MG CAPS capsule Take 0.4 mg by mouth daily.      Marland Kitchen tiZANidine (ZANAFLEX) 4 MG tablet Take 1 tablet (4 mg total) by mouth every 6 (six) hours as needed.  60 tablet  1  . traMADol (ULTRAM) 50 MG tablet Take 1 tablet (50 mg total) by mouth every 6 (six) hours as needed for pain.  60 tablet  1   No current facility-administered medications on file prior to visit.    ALLERGIES: Allergies  Allergen Reactions  . Ciprofloxacin   . Iohexol      Code: HIVES, Desc: PATENT STATES HE IS ALLERGIC TO IV DYE 09/14/08/RM, Onset Date: 16109604     FAMILY HISTORY: Family History  Problem Relation Age of Onset  . Heart disease Father     SOCIAL HISTORY: History   Social History  . Marital Status: Married    Spouse Name: N/A    Number of Children: N/A  . Years of Education: N/A   Occupational History  . Not on file.   Social History Main Topics  . Smoking status: Former Smoker    Quit date: 09/21/1955  . Smokeless  tobacco: Never Used  . Alcohol Use: Yes     Comment: one or two ounces a week  . Drug Use: No  . Sexual Activity: Not on file   Other Topics Concern  . Not on file   Social History Narrative  . No narrative on file    REVIEW OF SYSTEMS: Constitutional: No fevers, chills, or sweats, no generalized fatigue, change in appetite Eyes: No visual changes, double vision, eye pain Ear, nose and throat: No hearing loss, ear pain, nasal congestion, sore throat Cardiovascular: No chest pain, palpitations Respiratory:  No shortness of breath  at rest or with exertion, wheezes GastrointestinaI: No nausea, vomiting, diarrhea, abdominal pain, fecal incontinence Genitourinary:  No dysuria, urinary retention or frequency Musculoskeletal:  No neck pain, back pain Integumentary: No rash, pruritus, skin lesions Neurological: as above Psychiatric: No depression, insomnia, anxiety Endocrine: No palpitations, fatigue, diaphoresis, mood swings, change in appetite, change in weight, increased thirst Hematologic/Lymphatic:  No anemia, purpura, petechiae. Allergic/Immunologic: no itchy/runny eyes, nasal congestion, recent allergic reactions, rashes  PHYSICAL EXAM: Filed Vitals:   01/14/13 0843  BP: 120/68  Pulse: 60  Temp: 97.6 F (36.4 C)   General: No acute distress Head:  Normocephalic/atraumatic Neck: supple, bilateral upper cervical paraspinal tenderness more pronounced on the right, mild suboccipital tenderness, full range of motion Back: No paraspinal tenderness Heart: regular rate and rhythm Lungs: Clear to auscultation bilaterally. Vascular: No carotid bruits. Neurological Exam: Mental status: alert and oriented to person, place, and time, speech fluent and not dysarthric, language intact. Cranial nerves: CN I: not tested CN II: pupils equal, round and reactive to light, visual fields intact, fundi unremarkable. CN III, IV, VI:  full range of motion, no nystagmus, no ptosis CN V: facial  sensation intact CN VII: upper and lower face symmetric CN VIII: hearing intact CN IX, X: gag intact, uvula midline CN XI: sternocleidomastoid and trapezius muscles intact CN XII: tongue midline Bulk & Tone: normal, no fasciculations. Motor: 5/5 throughout Sensation: pinprick and vibration intact Deep Tendon Reflexes: 1+ throughout, toes down Finger to nose testing: no dysmetria Heel to shin: no dysmetria Gait: normal stance and stride, able to walk on toes, heels and in tandem. Romberg negative.  IMPRESSION & PLAN: 1.  Cervicogenic headache.  Likely related to arthritis, given that pain is worse after prolonged period of not moving his neck.  I do not suspect an intracranial process, given the normal exam.  Headache type does not really correlate with migraine. 2.  Transient visual disturbance.  I don't suspect cerebrovascular event as he did not have any negative symptoms (such as vision loss).  Possibly ophthalmic migraine, although typically a diagnosis of exclusion and usual in a person without history of headache.  However, since the symptoms were transient and involving both eyes, and he has not had another spell in 6 weeks, and his exam is normal, I do not suspect an intracranial abnormality and would not favor need for imaging at this time.  - We will start an NSAID, since this is typically effective for arthritic pain.  We will start Mobic 7.5mg  qhs.  In two weeks he will call with update and make changes if necessary.  He has GERD and already is on omeprazole for stomach protection.  If he has worsening GI symptoms, he should let us know.  -  Follow up in 4 weeks.  He is to call with any questions or concerns.  Thank you for allowing me to take part in the care of this patient.  Shon Millet, DO  CC:  Oliver Barre, MD

## 2013-01-14 NOTE — Patient Instructions (Addendum)
Regarding the posterior headache:  I feel it is due to the neck, most likely secondary to arthritis, especially given that it seems worse after period of inactive neck movement and better with movement.  So we will start an non-steroidal anti-inflammatory medication, which is typically helpful for arthritic pain.  We will try Mobic 7.5mg  at bedtime.  Call in 2 weeks with an update.  This can cause GI upset.  Your are already on omeprazole which is good, but call if you have increased GI problems.  Then follow up with me 2 weeks after that.  Other options would include other NSAIDs or physical therapy.  The brief visual disturbance does seem like possible opthalmic or ocular migraine.  You can have this without headache.  It is unusual though, in the sense that you have no history of migraine or headache.  However, since it was brief, you haven't had another spell in 6 weeks, and your exam is normal, I would not push for imaging of the brain at this time.  Call with any questions or concerns.

## 2013-01-18 ENCOUNTER — Other Ambulatory Visit: Payer: Self-pay | Admitting: Cardiology

## 2013-02-19 ENCOUNTER — Ambulatory Visit: Payer: Medicare Other | Admitting: Neurology

## 2013-02-25 ENCOUNTER — Ambulatory Visit (INDEPENDENT_AMBULATORY_CARE_PROVIDER_SITE_OTHER): Payer: Medicare Other | Admitting: Internal Medicine

## 2013-02-25 ENCOUNTER — Encounter: Payer: Self-pay | Admitting: Internal Medicine

## 2013-02-25 VITALS — BP 112/72 | HR 68 | Temp 97.7°F | Ht 70.0 in | Wt 180.2 lb

## 2013-02-25 DIAGNOSIS — R42 Dizziness and giddiness: Secondary | ICD-10-CM

## 2013-02-25 DIAGNOSIS — I10 Essential (primary) hypertension: Secondary | ICD-10-CM

## 2013-02-25 DIAGNOSIS — F341 Dysthymic disorder: Secondary | ICD-10-CM

## 2013-02-25 DIAGNOSIS — Z23 Encounter for immunization: Secondary | ICD-10-CM

## 2013-02-25 DIAGNOSIS — I951 Orthostatic hypotension: Secondary | ICD-10-CM

## 2013-02-25 MED ORDER — SERTRALINE HCL 50 MG PO TABS: 50.0000 mg | ORAL_TABLET | Freq: Every day | ORAL | Status: DC

## 2013-02-25 MED ORDER — NITROGLYCERIN 0.4 MG SL SUBL: 0.4000 mg | SUBLINGUAL_TABLET | SUBLINGUAL | Status: DC

## 2013-02-25 NOTE — Assessment & Plan Note (Addendum)
.  ECG reviewed as per emr, by hx and exam most likely volume vs age related, for trial increased fluids/small salt increase next 4-5 days, f/u any worsening or persistent symtpoms,  recent data reviewed with pt, and pt to continue medical treatment as before,  to f/u any worsening symptoms or concerns Lab Results  Component Value Date   WBC 9.4 12/02/2012   HGB 15.4 12/02/2012   HCT 45.6 12/02/2012   PLT 181.0 12/02/2012   GLUCOSE 117* 12/02/2012   CHOL 107 12/02/2012   TRIG 116.0 12/02/2012   HDL 41.30 12/02/2012   LDLCALC 43 12/02/2012   ALT 29 12/02/2012   AST 24 12/02/2012   NA 140 12/02/2012   K 4.8 12/02/2012   CL 107 12/02/2012   CREATININE 0.9 12/02/2012   BUN 23 12/02/2012   CO2 26 12/02/2012   TSH 1.77 12/02/2012   PSA 0.48 12/02/2012   INR 1.2 09/04/2008   HGBA1C 6.1 12/02/2012

## 2013-02-25 NOTE — Progress Notes (Signed)
Subjective:    Patient ID: Troy Combs, male    DOB: [birth year], 79 y.o.   MRN: [id number]  HPI  Here to f/u, unfortunately with 2 mo gradually worsening symptoms dizziness/lightheaded with standing, but Pt denies chest pain, increased sob or doe, wheezing, orthopnea, PND, increased LE swelling, palpitations, or syncope.  No GI symptoms or Denies worsening reflux, abd pain, dysphagia, n/v, bowel change or blood.   Pt denies fever, wt loss, night sweats, loss of appetite, or other constitutional symptoms, or decreased po intake though is adamant he does not drink enough fluids.  Denies worsening depressive symptoms, suicidal ideation, or panic; has ongoing anxiety, not increased recently.  Due for flu shot Past Medical History  Diagnosis Date  . Old myocardial infarction   . Unspecified essential hypertension   . Personal history of other diseases of digestive system   . Personal history of malignant melanoma of skin   . Dysthymic disorder   . Anemia, unspecified   . Thoracic or lumbosacral neuritis or radiculitis, unspecified   . Impotence of organic origin   . Malignant melanoma of skin of upper limb, including shoulder   . Bladder neck obstruction   . Coronary atherosclerosis of unspecified type of vessel, native or graft   . Impaired glucose tolerance 12/02/2012   Past Surgical History  Procedure Laterality Date  . Arthrodesis  07/06/2002    of left long finger distal interphalangeal joint with Kirschner wire fixation X 3  . Coronary artery bypass graft  10/17/2000    Gwenith Daily. Tyrone Sage, MD  . Atrial ablation surgery  05/04/2001    Dr. Lewayne Bunting  . Coronary angioplasty  1993  . Hernia repair      BILATERAL  . Cholecystectomy    . Exploratory laparotomy      AFTER LIVER TRAUMA AND HAND SURGERY  . Cardiac catheterization  09/05/2008    Revealing 4 of 4 patent grafts with native multivessel coronary artery disease, EF  of 60% without regional wall motion abnormalities.    reports that he quit smoking about 57 years ago. He has never used smokeless tobacco. He reports that he drinks alcohol. He reports that he does not use illicit drugs. family history includes Heart disease in his father. There is no history of Ataxia, Chorea, Dementia, Mental retardation, Migraines, Multiple sclerosis, Neurofibromatosis, Neuropathy, Parkinsonism, Seizures, or Stroke. Allergies  Allergen Reactions  . Ciprofloxacin   . Iohexol      Code: HIVES, Desc: PATENT STATES HE IS ALLERGIC TO IV DYE 09/14/08/RM, Onset Date: [id number]    Current Outpatient Prescriptions on File Prior to Visit  Medication Sig Dispense Refill  . aspirin 81 MG tablet Take 81 mg by mouth daily.      . finasteride (PROSCAR) 5 MG tablet TAKE 1 TABLET DAILY  90 tablet  0  . metoprolol succinate (TOPROL-XL) 100 MG 24 hr tablet TAKE 1 TABLET DAILY  90 tablet  0  . nortriptyline (PAMELOR) 10 MG capsule Take 2 capsules (20 mg total) by mouth at bedtime.  90 capsule  1  . omeprazole (PRILOSEC) 20 MG capsule Take 20 mg by mouth daily.        . rosuvastatin (CRESTOR) 10 MG tablet Take 1 tablet (10 mg total) by mouth daily.  90 tablet  0  . tamsulosin (FLOMAX) 0.4 MG CAPS capsule Take 0.4 mg by mouth daily.       No current facility-administered medications on file prior to visit.   Review of  Systems  Constitutional: Negative for unexpected weight change, or unusual diaphoresis  HENT: Negative for tinnitus.   Eyes: Negative for photophobia and visual disturbance.  Respiratory: Negative for choking and stridor.   Gastrointestinal: Negative for vomiting and blood in stool.  Genitourinary: Negative for hematuria and decreased urine volume.  Musculoskeletal: Negative for acute joint swelling Skin: Negative for color change and wound.  Neurological: Negative for tremors and numbness other than noted  Psychiatric/Behavioral: Negative for decreased concentration or  hyperactivity.       Objective:   Physical Exam BP  112/72  Pulse 68  Temp(Src) 97.7 F (36.5 C) (Oral)  Ht 5\' 10"  (1.778 m)  Wt 180 lb 4 oz (81.761 kg)  BMI 25.86 kg/m2  SpO2 97% VS noted, orthostatic on postural challenge today Constitutional: Pt appears well-developed and well-nourished.  HENT: Head: NCAT.  Right Ear: External ear normal.  Left Ear: External ear normal.  Eyes: Conjunctivae and EOM are normal. Pupils are equal, round, and reactive to light.  Neck: Normal range of motion. Neck supple.  Cardiovascular: Normal rate and regular rhythm.   Pulmonary/Chest: Effort normal and breath sounds normal.  Abd:  Soft, NT, non-distended, + BS Neurological: Pt is alert. Not confused , motor 5/5 Skin: Skin is warm. No erythema.  Psychiatric: Pt behavior is normal. Thought content normal.     Assessment & Plan:

## 2013-02-25 NOTE — Patient Instructions (Signed)
You have orthostatic hypotension today Please drink small sips of fluid constantly over the next 5 days Please consider keeping a urinal by the bedside at night to avoid having to walk to the Bathroom and possibly falling out if you need to urinate at night You should try a small amounts of table salt extra in the diet over the next few days Please return in 5-7 days if not improved, as you may need to have medication changes or other lab evaluation  Please remember to sign up for My Chart if you have not done so, as this will be important to you in the future with finding out test results, communicating by private email, and scheduling acute appointments online when needed.

## 2013-02-28 NOTE — Assessment & Plan Note (Signed)
stable overall by history and exam, and pt to continue medical treatment as before,  to f/u any worsening symptoms or concerns 

## 2013-02-28 NOTE — Assessment & Plan Note (Signed)
stable overall by history and exam, recent data reviewed with pt, and pt to continue medical treatment as before,  to f/u any worsening symptoms or concerns BP Readings from Last 3 Encounters:  02/25/13 112/72  01/14/13 120/68  12/18/12 132/70

## 2013-03-09 ENCOUNTER — Other Ambulatory Visit: Payer: Self-pay | Admitting: Cardiology

## 2013-04-18 ENCOUNTER — Other Ambulatory Visit: Payer: Self-pay | Admitting: Cardiovascular Disease

## 2013-05-15 ENCOUNTER — Other Ambulatory Visit: Payer: Self-pay | Admitting: Cardiology

## 2013-05-15 ENCOUNTER — Other Ambulatory Visit: Payer: Self-pay | Admitting: Internal Medicine

## 2013-05-21 ENCOUNTER — Telehealth: Payer: Self-pay

## 2013-05-21 MED ORDER — METOPROLOL SUCCINATE ER 100 MG PO TB24: 100.0000 mg | ORAL_TABLET | Freq: Every day | ORAL | Status: DC

## 2013-05-21 MED ORDER — ROSUVASTATIN CALCIUM 10 MG PO TABS: 10.0000 mg | ORAL_TABLET | Freq: Every day | ORAL | Status: DC

## 2013-05-21 NOTE — Telephone Encounter (Signed)
The patient called requesting a #30 day of metoprolol sent to [facility].  Also #90 of both Metoprolol and Crestor to [facility].   Refills have been completed as requested.

## 2013-05-27 ENCOUNTER — Ambulatory Visit: Payer: Medicare Other | Admitting: Internal Medicine

## 2013-05-27 DIAGNOSIS — Z0289 Encounter for other administrative examinations: Secondary | ICD-10-CM

## 2013-07-09 ENCOUNTER — Telehealth: Payer: Self-pay

## 2013-07-09 MED ORDER — NORTRIPTYLINE HCL 25 MG PO CAPS: 25.0000 mg | ORAL_CAPSULE | Freq: Every day | ORAL | Status: DC

## 2013-07-09 NOTE — Telephone Encounter (Signed)
The patients last refill of Nortriptyline 10 mg #90 was on 05/15/13.  Instructions are to take 2 qhs.  The amount #90 was not enough for a 3 month supply should have been #180?Troy Combs  The patient does want to know if he could change to Nortriptyline 25 mg and take 1 qhs, which seems would be easier for him and the pharmacy.  Advise please

## 2013-07-09 NOTE — Telephone Encounter (Signed)
Ok for change - done erx 

## 2013-07-09 NOTE — Telephone Encounter (Signed)
Called the patient informed his wife of change to mail order.

## 2013-09-03 ENCOUNTER — Ambulatory Visit (INDEPENDENT_AMBULATORY_CARE_PROVIDER_SITE_OTHER): Payer: Medicare Other | Admitting: Family Medicine

## 2013-09-03 ENCOUNTER — Encounter: Payer: Self-pay | Admitting: Family Medicine

## 2013-09-03 VITALS — BP 110/70 | HR 70 | Wt 179.0 lb

## 2013-09-03 DIAGNOSIS — M545 Low back pain, unspecified: Secondary | ICD-10-CM

## 2013-09-03 NOTE — Progress Notes (Signed)
  [facility] [address], [place] [address] Phone: [phone] Subjective:    I'm seeing this patient by the request  of:  [name], MD   CC: Loaded back pain chronic  [id number] Troy Combs is a 80 y.o. male coming in with complaint of low back pain. Patient has had back pain for multiple years. Patient has had workup for this previously as well. 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 Most exercise patient does is walk his dog for 5 times a day around the block. Patient states he that he has chronic back pain that is worse with any type of movement and better with rest but never completely asymptomatic. Patient states is localized mostly over the left lower back. Denies any radiation down the legs denies any weakness or numbness of the leg. Patient did have a workup for this previously and did have x-rays in August of 2014 which were reviewed by me today. Patient's x-ray show that he does have a spondylolisthesis of L4 on L5 as well as facet arthropathy at multiple levels. Patient does have severe L2-L3 degenerative disc disease. Patient has tried over-the-counter medications as well as taking nortriptyline at night to help him with sleep. Patient states that the pain does not keep him up at night. Patient rates the severity of 7/10.     Past medical history, social, surgical and family history all reviewed in electronic medical record.   Review of Systems: No headache, visual changes, nausea, vomiting, diarrhea, constipation, dizziness, abdominal pain, skin rash, fevers, chills, night sweats, weight loss, swollen lymph nodes, body aches, joint swelling, muscle aches, chest pain, shortness of breath, mood changes.   Objective Blood pressure 110/70, pulse 70, weight 179 lb (81.194 kg), SpO2 92.00%.  General: No apparent distress alert and oriented x3 mood and affect normal, dressed  appropriately. Very pleasant gentleman HEENT: Pupils equal, extraocular movements intact  Respiratory: Patient's speak in full sentences and does not appear short of breath  Cardiovascular: No lower extremity edema, non tender, no erythema  Skin: Warm dry intact with no signs of infection or rash on extremities or on axial skeleton.  Abdomen: Soft nontender  Neuro: Cranial nerves II through XII are intact, neurovascularly intact in all extremities with 2+ DTRs and 2+ pulses.  Lymph: No lymphadenopathy of posterior or anterior cervical chain or axillae bilaterally.  Gait normal with good balance and coordination.  MSK:  Non tender with full range of motion and good stability and symmetric strength and tone of shoulders, elbows, wrist, hip, knee and ankles bilaterally.  Back Exam:  Inspection: Unremarkable  Motion: Flexion 45 deg, Extension 35 deg, Side Bending to 35 deg bilaterally,  Rotation to 35 deg bilaterally  SLR laying: Negative  XSLR laying: Negative  Palpable tenderness: Patient is tender to palpation at the site paraspinal musculature of the lumbar spine but generalized. FABER: negative. Sensory change: Gross sensation intact to all lumbar and sacral dermatomes.  Reflexes: 2+ at both patellar tendons, 2+ at achilles tendons, Babinski's downgoing.  Strength at foot  Plantar-flexion: 5/5 Dorsi-flexion: 5/5 Eversion: 5/5 Inversion: 5/5  Leg strength  Quad: 5/5 Hamstring: 4/5 Hip flexor: 4/5 Hip abductors: 3/5  Gait unremarkable.    Impression and Recommendations:     This case required medical decision making of moderate complexity.

## 2013-09-03 NOTE — Patient Instructions (Signed)
Very nice to meet you Take tylenol 650 mg three times a day is the best evidence based medicine we have for arthritis.  Glucosamine sulfate 750mg  twice a day is a supplement that has been shown to help moderate to severe arthritis. Vitamin D 2000 IU daily Fish oil 2 grams daily.  Tumeric 500mg  twice daily.  Capsaicin topically up to four times a day may also help with pain. Cortisone injections are an option if these interventions do not seem to make a difference or need more relief.  If cortisone injections do not help, there are different types of shots that may help but they take longer to take effect.  We can discuss this at follow up.  It's important that you continue to stay active. Controlling your weight is important.  Consider physical therapy to strengthen muscles around the joint that hurts to take pressure off of the joint itself. Shoe inserts with good arch support may be helpful.  [employer] orthotics at [employer] [facility] could help.  Water aerobics and cycling with low resistance are the best two types of exercise for arthritis. Come back and see me in 3 weeks.

## 2013-09-03 NOTE — Assessment & Plan Note (Signed)
Patient's low back pain is multifactorial with significant osteoarthritic findings on x-ray patient also has not been very active which is likely given patient muscle imbalances which is also contributing to his pain. We discussed an exercise program and will increase patient's activity. We discussed over-the-counter medications and to be beneficial and we discussed proper shoe choices I can be helpful. Patient is going to try to make these changes and see if we make any significant improvement. I also discussed with patient cardiac history cardiovascular exercise would be very helpful. Patient will try this and come back again in 3 weeks for further evaluation. Patient continues to have pain we'll consider repeating x-ray Last x-rays were of August 2014

## 2013-09-22 ENCOUNTER — Encounter: Payer: Self-pay | Admitting: Family Medicine

## 2013-09-22 ENCOUNTER — Ambulatory Visit (INDEPENDENT_AMBULATORY_CARE_PROVIDER_SITE_OTHER): Payer: Medicare Other | Admitting: Family Medicine

## 2013-09-22 VITALS — BP 132/82 | HR 67 | Ht 69.0 in | Wt 176.0 lb

## 2013-09-22 DIAGNOSIS — M545 Low back pain, unspecified: Secondary | ICD-10-CM

## 2013-09-22 DIAGNOSIS — M23209 Derangement of unspecified meniscus due to old tear or injury, unspecified knee: Secondary | ICD-10-CM

## 2013-09-22 DIAGNOSIS — M1710 Unilateral primary osteoarthritis, unspecified knee: Secondary | ICD-10-CM

## 2013-09-22 DIAGNOSIS — M23302 Other meniscus derangements, unspecified lateral meniscus, unspecified knee: Secondary | ICD-10-CM

## 2013-09-22 NOTE — Assessment & Plan Note (Signed)
Patient back pain has resolved with conservative therapy. I do think patient's pain will likely have an exacerbation secondary to the underlying arthritis. Patient will continue the over-the-counter medications as well as a home exercises on a regular basis. Patient to follow up with me on an as-needed basis for this problem.

## 2013-09-22 NOTE — Progress Notes (Signed)
[facility] [address], [place] [address] Phone: [phone] Subjective:    CC: Followup back pain and New right knee pain  [id number] Troy Combs is a 80 y.o. male coming in for followup of low back pain. Patient was given home exercises we discussed over-the-counter medications. Patient has taken all recommendations and states that he is 100% better. Patient states that he can still make his back hurt if he does certain movements but overall is able to do all activities of daily living and is able to sit comfortably. Patient is very happy and is having no side effects to medications.  Patient does state that he does have new problem. Patient is having pain about right knee. Patient states he's had this pain intermittently over the course of multiple decades. Patient states recently notes been increasing in frequency and duration of the pain. Patient states that most of the pain seems to be on the medial aspect of the knee. Worse when walking downhill. Does not keep him up at night. Does have some mechanical symptoms and feels like it does give out on him. Has never fallen before but states that he is concerned about falling. Rates the severity a 6/10. Has tried over-the-counter medications with minimal improvement.     Past medical history, social, surgical and family history all reviewed in electronic medical record.   Review of Systems: No headache, visual changes, nausea, vomiting, diarrhea, constipation, dizziness, abdominal pain, skin rash, fevers, chills, night sweats, weight loss, swollen lymph nodes, body aches, joint swelling, muscle aches, chest pain, shortness of breath, mood changes.   Objective Blood pressure 132/82, pulse 67, height 5\' 9"  (1.753 m), weight 176 lb (79.833 kg), SpO2 95.00%.  General: No apparent distress alert and oriented x3 mood and affect normal, dressed appropriately. Very pleasant gentleman HEENT: Pupils  equal, extraocular movements intact  Respiratory: Patient's speak in full sentences and does not appear short of breath  Cardiovascular: No lower extremity edema, non tender, no erythema  Skin: Warm dry intact with no signs of infection or rash on extremities or on axial skeleton.  Abdomen: Soft nontender  Neuro: Cranial nerves II through XII are intact, neurovascularly intact in all extremities with 2+ DTRs and 2+ pulses.  Lymph: No lymphadenopathy of posterior or anterior cervical chain or axillae bilaterally.  Gait normal with good balance and coordination.  MSK:  Non tender with full range of motion and good stability and symmetric strength and tone of shoulders, elbows, wrist, hip,  and ankles bilaterally.  Back Exam:  Inspection: Unremarkable  Motion: Flexion 45 deg, Extension 35 deg, Side Bending to 35 deg bilaterally,  Rotation to 35 deg bilaterally  SLR laying: Negative  XSLR laying: Negative  Palpable tenderness: Nontender on exam FABER: negative. Sensory change: Gross sensation intact to all lumbar and sacral dermatomes.  Reflexes: 2+ at both patellar tendons, 2+ at achilles tendons, Babinski's downgoing.  Strength at foot  Plantar-flexion: 5/5 Dorsi-flexion: 5/5 Eversion: 5/5 Inversion: 5/5  Leg strength  Quad: 5/5 Hamstring: 4/5 Hip flexor: 4/5 Hip abductors: 4/5 mild improvement from previous exam Gait unremarkable. Knee: Right Normal to inspection with no erythema or effusion or obvious bony abnormalities. Patient does have some mild osteoarthritic changes Tender to palpation over the medial joint line ROM full in flexion and extension and lower leg rotation. Ligaments with solid consistent endpoints including ACL, PCL, LCL, MCL. Mild positiveMcmurray's, Apley's, and Thessalonian tests. painful patellar compression. Patellar glide without crepitus.  Patellar and quadriceps tendons unremarkable. Hamstring and quadriceps strength is normal.  Contralateral knee also  moderately tender to palpation of the medial joint line  MSK US performed of: Right knee This study was ordered, performed, and interpreted by Charlann Boxer D.O.  Knee: All structures visualized. Anteromedial meniscus does have what appears to be a degenerative chronic tear of the does have some mild displacement hypoechoic changes. Patient also does have narrowing of the medial and lateral joint lines., anterolateral, posteromedial, and posterolateral menisci unremarkable without tearing, fraying, effusion, or displacement. Patellar Tendon unremarkable on long and transverse views without effusion. No abnormality of prepatellar bursa. LCL and MCL unremarkable on long and transverse views. No abnormality of origin of medial or lateral head of the gastrocnemius.  IMPRESSION:  Moderate osteoarthritis with a chronic meniscal tear     Impression and Recommendations:     This case required medical decision making of moderate complexity.

## 2013-09-22 NOTE — Assessment & Plan Note (Signed)
Patient does have what appears to be a chronic meniscal tear. Patient was given a conservative approach. Patient will start on icing protocol, was given a trial topical medicine that if he would like to be beneficial. We discussed possible further imaging such as a x-ray which patient declined today. In addition this we discussed bracing. Patient was fitted with a reaction brace today. We gave patient a handout of home exercise program and showed proper technique today. Patient is to try all these interventions and come back again in 3-4 weeks for further evaluation.  Spent greater than 25 minutes with patient face-to-face and had greater than 50% of counseling including as described above in assessment and plan.

## 2013-09-22 NOTE — Assessment & Plan Note (Signed)
Patient does have some underlying osteoarthritis as well as a chronic meniscal tear that is likely giving him his problem. Patient was given a brace today, this was fitted by me today. Patient given home exercise program and will try topical anti-inflammatory. We will avoid oral anti-inflammatory secondary to patient's past medical history. Patient is going to try this intervention come back again in 3 weeks for further evaluation. If he continues to have pain we need to consider if possibly formal physical therapy, further imaging, as well as intra-articular injections.  Spent greater than 25 minutes with patient face-to-face and had greater than 50% of counseling including as described above in assessment and plan.

## 2013-09-22 NOTE — Patient Instructions (Signed)
I am happy your back is so much better For your knee  Try the topical 2 times daily and if you like it call me and I will send in a prescription.  Ice 20 minutes 2 times a day Exercises 3 times a week.  Wear brace with a lot of activity.  Come back again in 3 weeks to make sure you are better

## 2013-10-13 ENCOUNTER — Encounter: Payer: Self-pay | Admitting: Family Medicine

## 2013-10-13 ENCOUNTER — Ambulatory Visit (INDEPENDENT_AMBULATORY_CARE_PROVIDER_SITE_OTHER): Payer: Medicare Other | Admitting: Family Medicine

## 2013-10-13 VITALS — BP 110/64 | HR 54 | Ht 70.0 in | Wt 179.0 lb

## 2013-10-13 DIAGNOSIS — M23302 Other meniscus derangements, unspecified lateral meniscus, unspecified knee: Secondary | ICD-10-CM

## 2013-10-13 DIAGNOSIS — M23209 Derangement of unspecified meniscus due to old tear or injury, unspecified knee: Secondary | ICD-10-CM

## 2013-10-13 NOTE — Assessment & Plan Note (Signed)
Patient has made some mild improvement and does not have as much pain as he did previously. Encourage him to do more phase II training which include some more muscle strengthening and just stretches. Patient is going to start decreasing the amount of using the brace. In addition this patient cancer decrease in some of the over-the-counter anti-inflammatories. Patient and will followup with me can in 3-4 weeks. If continuing to have pain I would consider an intra-articular injection.

## 2013-10-13 NOTE — Patient Instructions (Signed)
Good to se eyou You are doing great OCntinue the exercises 3 times a week Ice is still your friend' Decrease the brace slowly over the course of the next month.  No need to wear it

## 2013-10-13 NOTE — Progress Notes (Signed)
  [facility] [place] [place], [place] [address] Phone: [phone] Subjective:    CC: Follow knee pain  YOV:[id number] Troy Combs is a 80 y.o. male coming in for followup of left knee pain. Patient was found to have some osteoarthritic changes as well as a chronic meniscal tear. Patient was given exercises as well as a brace and increasing protocol. Patient states that he is approximately 90% better. Patient still states that he has some mild discomfort with certain movements but is able to do all activities of daily living. Patient would like to start increasing the amount of activity he does as well as walking. Patient states only walking downhill is healing and it seems to give him some mild discomfort. Patient has been wearing the brace with all activity. Denies any swelling and denies any new symptoms.    Past medical history, social, surgical and family history all reviewed in electronic medical record.   Review of Systems: No headache, visual changes, nausea, vomiting, diarrhea, constipation, dizziness, abdominal pain, skin rash, fevers, chills, night sweats, weight loss, swollen lymph nodes, body aches, joint swelling, muscle aches, chest pain, shortness of breath, mood changes.   Objective There were no vitals taken for this visit.  General: No apparent distress alert and oriented x3 mood and affect normal, dressed appropriately. Very pleasant gentleman HEENT: Pupils equal, extraocular movements intact  Respiratory: Patient's speak in full sentences and does not appear short of breath  Cardiovascular: No lower extremity edema, non tender, no erythema  Skin: Warm dry intact with no signs of infection or rash on extremities or on axial skeleton.  Abdomen: Soft nontender  Neuro: Cranial nerves II through XII are intact, neurovascularly intact in all extremities with 2+ DTRs and 2+ pulses.  Lymph: No lymphadenopathy of posterior or anterior  cervical chain or axillae bilaterally.  Gait normal with good balance and coordination.  MSK:  Non tender with full range of motion and good stability and symmetric strength and tone of shoulders, elbows, wrist, hip,  and ankles bilaterally.  Back Exam:  Inspection: Unremarkable  Motion: Flexion 45 deg, Extension 35 deg, Side Bending to 35 deg bilaterally,  Rotation to 35 deg bilaterally  SLR laying: Negative  XSLR laying: Negative  Palpable tenderness: Nontender on exam FABER: negative. Sensory change: Gross sensation intact to all lumbar and sacral dermatomes.  Reflexes: 2+ at both patellar tendons, 2+ at achilles tendons, Babinski's downgoing.  Strength at foot  Plantar-flexion: 5/5 Dorsi-flexion: 5/5 Eversion: 5/5 Inversion: 5/5  Leg strength  Quad: 5/5 Hamstring: 4/5 Hip flexor: 4/5 Hip abductors: 4/5 mild improvement from previous exam Gait unremarkable. Knee: Right Normal to inspection with no changes from previous exam Tender to palpation over the medial joint line ROM full in flexion and extension and lower leg rotation. Ligaments with solid consistent endpoints including ACL, PCL, LCL, MCL. Negative Mcmurray's, Apley's, and Thessalonian tests. painful patellar compression. Patellar glide without crepitus. Patellar and quadriceps tendons unremarkable. Hamstring and quadriceps strength is normal.  Contralateral knee also moderately tender to palpation of the medial joint line       Impression and Recommendations:     This case required medical decision making of moderate complexity.

## 2013-11-17 ENCOUNTER — Ambulatory Visit: Payer: Medicare Other | Admitting: Family Medicine

## 2013-11-17 DIAGNOSIS — Z0289 Encounter for other administrative examinations: Secondary | ICD-10-CM

## 2013-12-07 ENCOUNTER — Encounter: Payer: Medicare Other | Admitting: Internal Medicine

## 2013-12-29 ENCOUNTER — Other Ambulatory Visit: Payer: Self-pay | Admitting: Internal Medicine

## 2013-12-30 NOTE — Telephone Encounter (Signed)
One month refill done for pamelor  Office to call pt - due ROV oct 2015 please

## 2013-12-30 NOTE — Telephone Encounter (Signed)
Left patient a message to call back to schedule an appt and to notify that script has been refilled for one time.

## 2014-01-13 ENCOUNTER — Other Ambulatory Visit: Payer: Self-pay | Admitting: Internal Medicine

## 2014-01-20 ENCOUNTER — Other Ambulatory Visit (INDEPENDENT_AMBULATORY_CARE_PROVIDER_SITE_OTHER): Payer: Medicare Other

## 2014-01-20 ENCOUNTER — Encounter: Payer: Self-pay | Admitting: Internal Medicine

## 2014-01-20 ENCOUNTER — Ambulatory Visit (INDEPENDENT_AMBULATORY_CARE_PROVIDER_SITE_OTHER): Payer: Medicare Other | Admitting: Internal Medicine

## 2014-01-20 ENCOUNTER — Telehealth: Payer: Self-pay | Admitting: Internal Medicine

## 2014-01-20 VITALS — BP 106/78 | HR 61 | Temp 98.1°F | Ht 69.5 in | Wt 180.2 lb

## 2014-01-20 DIAGNOSIS — R42 Dizziness and giddiness: Secondary | ICD-10-CM

## 2014-01-20 DIAGNOSIS — E782 Mixed hyperlipidemia: Secondary | ICD-10-CM | POA: Diagnosis not present

## 2014-01-20 DIAGNOSIS — Z0000 Encounter for general adult medical examination without abnormal findings: Secondary | ICD-10-CM

## 2014-01-20 DIAGNOSIS — Z23 Encounter for immunization: Secondary | ICD-10-CM

## 2014-01-20 DIAGNOSIS — I10 Essential (primary) hypertension: Secondary | ICD-10-CM

## 2014-01-20 LAB — LIPID PANEL
Cholesterol: 121 mg/dL (ref 0–200)
HDL: 39.2 mg/dL (ref >39.00)
LDL Cholesterol: 44 mg/dL (ref 0–99)
NonHDL: 81.8
Total CHOL/HDL Ratio: 3
Triglycerides: 188 mg/dL — ABNORMAL HIGH (ref 0.0–149.0)
VLDL: 37.6 mg/dL (ref 0.0–40.0)

## 2014-01-20 LAB — BASIC METABOLIC PANEL
BUN: 20 mg/dL (ref 6–23)
CO2: 29 mEq/L (ref 19–32)
Calcium: 9.8 mg/dL (ref 8.4–10.5)
Chloride: 102 mEq/L (ref 96–112)
Creatinine, Ser: 0.9 mg/dL (ref 0.4–1.5)
GFR: 85.17 mL/min (ref >60.00)
Glucose, Bld: 117 mg/dL — ABNORMAL HIGH (ref 70–99)
Potassium: 5.3 mEq/L — ABNORMAL HIGH (ref 3.5–5.1)
Sodium: 137 mEq/L (ref 135–145)

## 2014-01-20 LAB — URINALYSIS, ROUTINE W REFLEX MICROSCOPIC
Bilirubin Urine: NEGATIVE
Hgb urine dipstick: NEGATIVE
Ketones, ur: NEGATIVE
Leukocytes, UA: NEGATIVE
Nitrite: NEGATIVE
RBC / HPF: NONE SEEN (ref 0–?)
Specific Gravity, Urine: 1.01 (ref 1.000–1.030)
Total Protein, Urine: NEGATIVE
Urine Glucose: NEGATIVE
Urobilinogen, UA: 0.2 (ref 0.0–1.0)
WBC, UA: NONE SEEN (ref 0–?)
pH: 7 (ref 5.0–8.0)

## 2014-01-20 LAB — CBC WITH DIFFERENTIAL/PLATELET
Basophils Absolute: 0 10*3/uL (ref 0.0–0.1)
Basophils Relative: 0.3 % (ref 0.0–3.0)
Eosinophils Absolute: 0.4 10*3/uL (ref 0.0–0.7)
Eosinophils Relative: 3.9 % (ref 0.0–5.0)
HCT: 45.3 % (ref 39.0–52.0)
Hemoglobin: 15.3 g/dL (ref 13.0–17.0)
Lymphocytes Relative: 22.4 % (ref 12.0–46.0)
Lymphs Abs: 2.1 10*3/uL (ref 0.7–4.0)
MCHC: 33.9 g/dL (ref 30.0–36.0)
MCV: 90.3 fl (ref 78.0–100.0)
Monocytes Absolute: 1 10*3/uL (ref 0.1–1.0)
Monocytes Relative: 10.4 % (ref 3.0–12.0)
Neutro Abs: 5.8 10*3/uL (ref 1.4–7.7)
Neutrophils Relative %: 63 % (ref 43.0–77.0)
Platelets: 206 10*3/uL (ref 150.0–400.0)
RBC: 5.01 Mil/uL (ref 4.22–5.81)
RDW: 13.4 % (ref 11.5–15.5)
WBC: 9.3 10*3/uL (ref 4.0–10.5)

## 2014-01-20 LAB — HEPATIC FUNCTION PANEL
ALT: 25 U/L (ref 0–53)
AST: 21 U/L (ref 0–37)
Albumin: 4.6 g/dL (ref 3.5–5.2)
Alkaline Phosphatase: 52 U/L (ref 39–117)
Bilirubin, Direct: 0.1 mg/dL (ref 0.0–0.3)
Total Bilirubin: 0.5 mg/dL (ref 0.2–1.2)
Total Protein: 7.5 g/dL (ref 6.0–8.3)

## 2014-01-20 LAB — TSH: TSH: 1.14 u[IU]/mL (ref 0.35–4.50)

## 2014-01-20 MED ORDER — TAMSULOSIN HCL 0.4 MG PO CAPS: 0.4000 mg | ORAL_CAPSULE | Freq: Every day | ORAL | Status: DC

## 2014-01-20 MED ORDER — METOPROLOL SUCCINATE ER 100 MG PO TB24: ORAL_TABLET | ORAL | Status: DC

## 2014-01-20 NOTE — Patient Instructions (Addendum)
You had the flu shot today  Please make a Nurse Visit appt in 2 weeks for the Prevnar pneumonia shot  OK to decrease the toprol Xl to HALF of the 100 mg per day  Please continue all other medications as before, and refills have been done if requested.  Please have the pharmacy call with any other refills you may need.  Please continue your efforts at being more active, low cholesterol diet, and weight control.  You are otherwise up to date with prevention measures today.  Please keep your appointments with your specialists as you may have planned  Please go to the LAB in the Basement (turn left off the elevator) for the tests to be done today  You will be contacted by phone if any changes need to be made immediately.  Otherwise, you will receive a letter about your results with an explanation, but please check with MyChart first.  Please remember to sign up for MyChart if you have not done so, as this will be important to you in the future with finding out test results, communicating by private email, and scheduling acute appointments online when needed.  Please return in 1 year for your yearly visit, or sooner if needed, with Lab testing done 3-5 days before

## 2014-01-20 NOTE — Progress Notes (Signed)
Subjective:    Patient ID: Troy Combs, male    DOB: June 20, 1931, 80 y.o.   MRN: [id number]  HPI  Here for wellness and f/u;  Overall doing ok;  Pt denies CP, worsening SOB, DOE, wheezing, orthopnea, PND, worsening LE edema, palpitations, dizziness or syncope.  Pt denies neurological change such as new headache, facial or extremity weakness.  Pt denies polydipsia, polyuria, or low sugar symptoms. Pt states overall good compliance with treatment and medications, good tolerability, and has been trying to follow lower cholesterol diet.  Pt denies worsening depressive symptoms, suicidal ideation or panic. No fever, night sweats, wt loss, loss of appetite, or other constitutional symptoms.  Pt states good ability with ADL's, has low fall risk, home safety reviewed and adequate, no other significant changes in hearing or vision, and only occasionally active with exercise, though trying to do more exercise in the last mo, wt overall stable Wt Readings from Last 3 Encounters:  01/20/14 180 lb 4 oz (81.761 kg)  10/13/13 179 lb (81.194 kg)  09/22/13 176 lb (79.833 kg)  Had some dizziness with standing iup, BP lower lately. Past Medical History  Diagnosis Date  . Old myocardial infarction   . Unspecified essential hypertension   . Personal history of other diseases of digestive system   . Personal history of malignant melanoma of skin   . Dysthymic disorder   . Anemia, unspecified   . Thoracic or lumbosacral neuritis or radiculitis, unspecified   . Impotence of organic origin   . Malignant melanoma of skin of upper limb, including shoulder   . Bladder neck obstruction   . Coronary atherosclerosis of unspecified type of vessel, native or graft   . Impaired glucose tolerance 12/02/2012   Past Surgical History  Procedure Laterality Date  . Arthrodesis  07/06/2002    of left long finger distal interphalangeal joint with Kirschner wire fixation X 3  . Coronary artery bypass graft  10/17/2000   [name]. [name], MD  . Atrial ablation surgery  05/04/2001    Dr. [name]  . Coronary angioplasty  1993  . Hernia repair      BILATERAL  . Cholecystectomy    . Exploratory laparotomy      AFTER LIVER TRAUMA AND HAND SURGERY  . Cardiac catheterization  09/05/2008    Revealing 4 of 4 patent grafts with native multivessel coronary artery disease, EF  of 60% without regional wall motion abnormalities.    reports that he quit smoking about 58 years ago. He has never used smokeless tobacco. He reports that he drinks alcohol. He reports that he does not use illicit drugs. family history includes Heart disease in his father. There is no history of Ataxia, Chorea, Dementia, Mental retardation, Migraines, Multiple sclerosis, Neurofibromatosis, Neuropathy, Parkinsonism, Seizures, or Stroke. Allergies  Allergen Reactions  . Ciprofloxacin   . Iohexol      Code: HIVES, Desc: PATENT STATES HE IS ALLERGIC TO IV DYE 09/14/08/RM, Onset Date: 66440347    Current Outpatient Prescriptions on File Prior to Visit  Medication Sig Dispense Refill  . aspirin 81 MG tablet Take 81 mg by mouth daily.      . finasteride (PROSCAR) 5 MG tablet TAKE 1 TABLET DAILY  90 tablet  0  . metoprolol succinate (TOPROL-XL) 100 MG 24 hr tablet Take 1 tablet (100 mg total) by mouth daily. Take with or immediately following a meal.  90 tablet  3  . nitroGLYCERIN (NITROSTAT) 0.4 MG SL tablet Place  1 tablet (0.4 mg total) under the tongue as directed.  30 tablet  3  . nortriptyline (PAMELOR) 25 MG capsule TAKE 1 CAPSULE AT BEDTIME  90 capsule  0  . omeprazole (PRILOSEC) 20 MG capsule Take 20 mg by mouth daily.        . rosuvastatin (CRESTOR) 10 MG tablet Take 1 tablet (10 mg total) by mouth daily.  90 tablet  3  . sertraline (ZOLOFT) 50 MG tablet TAKE 1 TABLET DAILY  90 tablet  0   No current facility-administered medications on file prior to visit.   Review of Systems Constitutional: Negative for increased diaphoresis,  other activity, appetite or other siginficant weight change  HENT: Negative for worsening hearing loss, ear pain, facial swelling, mouth sores and neck stiffness.   Eyes: Negative for other worsening pain, redness or visual disturbance.  Respiratory: Negative for shortness of breath and wheezing.   Cardiovascular: Negative for chest pain and palpitations.  Gastrointestinal: Negative for diarrhea, blood in stool, abdominal distention or other pain Genitourinary: Negative for hematuria, flank pain or change in urine volume.  Musculoskeletal: Negative for myalgias or other joint complaints.  Skin: Negative for color change and wound.  Neurological: Negative for syncope and numbness. other than noted Hematological: Negative for adenopathy. or other swelling Psychiatric/Behavioral: Negative for hallucinations, self-injury, decreased concentration or other worsening agitation.      Objective:   Physical Exam BP 106/78  Pulse 61  Temp(Src) 98.1 F (36.7 C) (Oral)  Ht 5' 9.5" (1.765 m)  Wt 180 lb 4 oz (81.761 kg)  BMI 26.25 kg/m2  SpO2 95% VS noted,  Constitutional: Pt is oriented to person, place, and time. Appears well-developed and well-nourished.  Head: Normocephalic and atraumatic.  Right Ear: External ear normal.  Left Ear: External ear normal.  Nose: Nose normal.  Mouth/Throat: Oropharynx is clear and moist.  Eyes: Conjunctivae and EOM are normal. Pupils are equal, round, and reactive to light.  Neck: Normal range of motion. Neck supple. No JVD present. No tracheal deviation present.  Cardiovascular: Normal rate, regular rhythm, normal heart sounds and intact distal pulses.   Pulmonary/Chest: Effort normal and breath sounds without rales or wheezing  Abdominal: Soft. Bowel sounds are normal. NT. No HSM  Musculoskeletal: Normal range of motion. Exhibits no edema.  Lymphadenopathy:  Has no cervical adenopathy.  Neurological: Pt is alert and oriented to person, place, and time. Pt  has normal reflexes. No cranial nerve deficit. Motor grossly intact Skin: Skin is warm and dry. No rash noted.  Psychiatric:  Has normal mood and affect. Behavior is normal.     Assessment & Plan:

## 2014-01-20 NOTE — Progress Notes (Signed)
Pre visit review using our clinic review tool, if applicable. No additional management support is needed unless otherwise documented below in the visit note. 

## 2014-01-20 NOTE — Telephone Encounter (Signed)
Patients coming in to get prevnar on 10/8

## 2014-01-22 NOTE — Assessment & Plan Note (Signed)
With borderline low BP today - for decreased metoprolol ER to 50 qd,  to f/u any worsening symptoms or concerns

## 2014-01-22 NOTE — Assessment & Plan Note (Signed)

## 2014-02-03 ENCOUNTER — Ambulatory Visit (INDEPENDENT_AMBULATORY_CARE_PROVIDER_SITE_OTHER): Payer: Medicare Other

## 2014-02-03 DIAGNOSIS — Z23 Encounter for immunization: Secondary | ICD-10-CM

## 2014-03-04 ENCOUNTER — Telehealth: Payer: Self-pay | Admitting: Internal Medicine

## 2014-03-04 NOTE — Telephone Encounter (Signed)
Patient Information:  Caller Name: Miron  Phone: [phone]  Patient: Troy Combs, Troy Combs  Gender: Male  DOB: [birth year]  Age: 80 Years  PCP: [name] (Adults only)  Office Follow Up:  Does the office need to follow up with this patient?: Yes  Instructions For The Office: Requesting appointment for BP/HR check in office with possible medication change.   Symptoms  Reason For Call & Symptoms: BP elevated and not feeling "normal". 160/90 HR=53 Normally 120/75 and HR 68-70. He has been exercising daily on treadmill and pulse does not go over 85 with activity. Gait unsteady at times and having bouts of mild dizziness. Last night had headache and took ASA 325 mgs 2 tabs PO and helped. Requesting appointment- concerned about BP and heart rate seeming low..  Reviewed Health History In EMR: Yes  Reviewed Medications In EMR: Yes  Reviewed Allergies In EMR: Yes  Reviewed Surgeries / Procedures: Yes  Date of Onset of Symptoms: 03/03/2014  Treatments Tried: ASA 325 2 tabs PO at 2300 03/03/14  Treatments Tried Worked: No  Guideline(s) Used:  High Blood Pressure  Heart Rate and Heartbeat Questions  Disposition Per Guideline:   See Today in Office  Reason For Disposition Reached:   Age > 60 years  Advice Given:  Lifestyle Changes  Maintain a healthy weight. Lose weight if you are overweight.  Do 30 minutes of aerobic physical activity (e.g., brisk walking) most days of the week.  Eat a diet high in fresh fruits and low-fat dairy products. Limit your intake of saturated and total fat. Choose foods that are lower in salt.  Call Back If:  Headache, blurred vision, difficulty talking, or difficulty walking occurs  Chest pain or difficulty breathing occurs  You want to come in to the office for a blood pressure check  You become worse.  Health Basics  Sleep: Try to get sufficient amount of sleep. Lack of sleep can aggravate palpitations. Most people need 7-8 hours of sleep each night.  Exercise: Regular exercise will improve your overall health, improve your mood, and is a simple method to reduce stress.  Diet: Eat a balanced healthy diet.  Liquid Intake: Drink adequate liquids, 6-8 glasses of water daily.  Expected Course  : If your symptoms do not improve over the next couple days then you should make an appointment to see your doctor.  Call Back If:  Chest pain, lightheadedness, or difficulty breathing occurs  Heart beating more than 130 beats / minute  More than 3 extra or skipped beats / minute  Patient Will Follow Care Advice:  YES

## 2014-04-02 ENCOUNTER — Other Ambulatory Visit: Payer: Self-pay | Admitting: Internal Medicine

## 2014-04-05 ENCOUNTER — Other Ambulatory Visit: Payer: Self-pay | Admitting: Internal Medicine

## 2014-04-05 NOTE — Telephone Encounter (Signed)
zoloft done erx

## 2014-04-17 ENCOUNTER — Other Ambulatory Visit: Payer: Self-pay | Admitting: Internal Medicine

## 2014-05-13 ENCOUNTER — Encounter (HOSPITAL_COMMUNITY): Payer: Self-pay | Admitting: *Deleted

## 2014-05-13 ENCOUNTER — Emergency Department (HOSPITAL_COMMUNITY)
Admission: EM | Admit: 2014-05-13 | Discharge: 2014-05-14 | Disposition: A | Discharge status: Home / Self Care | Payer: Medicare Other | Attending: Emergency Medicine | Admitting: Emergency Medicine

## 2014-05-13 ENCOUNTER — Ambulatory Visit (INDEPENDENT_AMBULATORY_CARE_PROVIDER_SITE_OTHER): Payer: Medicare Other | Admitting: Internal Medicine

## 2014-05-13 ENCOUNTER — Emergency Department (HOSPITAL_COMMUNITY): Payer: Medicare Other

## 2014-05-13 ENCOUNTER — Encounter: Payer: Self-pay | Admitting: Internal Medicine

## 2014-05-13 VITALS — BP 140/82 | HR 85 | Temp 97.3°F | Ht 69.0 in | Wt 178.4 lb

## 2014-05-13 DIAGNOSIS — Z85820 Personal history of malignant melanoma of skin: Secondary | ICD-10-CM | POA: Diagnosis not present

## 2014-05-13 DIAGNOSIS — K5792 Diverticulitis of intestine, part unspecified, without perforation or abscess without bleeding: Secondary | ICD-10-CM | POA: Diagnosis not present

## 2014-05-13 DIAGNOSIS — I2510 Atherosclerotic heart disease of native coronary artery without angina pectoris: Secondary | ICD-10-CM | POA: Diagnosis not present

## 2014-05-13 DIAGNOSIS — Z87891 Personal history of nicotine dependence: Secondary | ICD-10-CM | POA: Insufficient documentation

## 2014-05-13 DIAGNOSIS — Z8739 Personal history of other diseases of the musculoskeletal system and connective tissue: Secondary | ICD-10-CM | POA: Insufficient documentation

## 2014-05-13 DIAGNOSIS — I10 Essential (primary) hypertension: Secondary | ICD-10-CM | POA: Diagnosis not present

## 2014-05-13 DIAGNOSIS — R1032 Left lower quadrant pain: Secondary | ICD-10-CM | POA: Diagnosis present

## 2014-05-13 DIAGNOSIS — Z951 Presence of aortocoronary bypass graft: Secondary | ICD-10-CM | POA: Insufficient documentation

## 2014-05-13 DIAGNOSIS — Z79899 Other long term (current) drug therapy: Secondary | ICD-10-CM | POA: Diagnosis not present

## 2014-05-13 DIAGNOSIS — Z87438 Personal history of other diseases of male genital organs: Secondary | ICD-10-CM | POA: Diagnosis not present

## 2014-05-13 DIAGNOSIS — Z862 Personal history of diseases of the blood and blood-forming organs and certain disorders involving the immune mechanism: Secondary | ICD-10-CM | POA: Insufficient documentation

## 2014-05-13 DIAGNOSIS — Z9889 Other specified postprocedural states: Secondary | ICD-10-CM | POA: Insufficient documentation

## 2014-05-13 DIAGNOSIS — I252 Old myocardial infarction: Secondary | ICD-10-CM | POA: Diagnosis not present

## 2014-05-13 DIAGNOSIS — Z7982 Long term (current) use of aspirin: Secondary | ICD-10-CM | POA: Diagnosis not present

## 2014-05-13 DIAGNOSIS — Z8659 Personal history of other mental and behavioral disorders: Secondary | ICD-10-CM | POA: Insufficient documentation

## 2014-05-13 LAB — URINALYSIS, ROUTINE W REFLEX MICROSCOPIC
Bilirubin Urine: NEGATIVE
Glucose, UA: NEGATIVE mg/dL
Hgb urine dipstick: NEGATIVE
Ketones, ur: NEGATIVE mg/dL
Leukocytes, UA: NEGATIVE
Nitrite: NEGATIVE
Protein, ur: NEGATIVE mg/dL
Specific Gravity, Urine: 1.024 (ref 1.005–1.030)
Urobilinogen, UA: 1 mg/dL (ref 0.0–1.0)
pH: 7 (ref 5.0–8.0)

## 2014-05-13 LAB — COMPREHENSIVE METABOLIC PANEL
ALT: 21 U/L (ref 0–53)
AST: 23 U/L (ref 0–37)
Albumin: 4.6 g/dL (ref 3.5–5.2)
Alkaline Phosphatase: 46 U/L (ref 39–117)
Anion gap: 5 (ref 5–15)
BUN: 16 mg/dL (ref 6–23)
CO2: 30 mmol/L (ref 19–32)
Calcium: 10.2 mg/dL (ref 8.4–10.5)
Chloride: 104 mEq/L (ref 96–112)
Creatinine, Ser: 0.86 mg/dL (ref 0.50–1.35)
GFR calc Af Amer: >90 mL/min (ref >90)
GFR calc non Af Amer: 80 mL/min — ABNORMAL LOW (ref >90)
Glucose, Bld: 121 mg/dL — ABNORMAL HIGH (ref 70–99)
Potassium: 4.7 mmol/L (ref 3.5–5.1)
Sodium: 139 mmol/L (ref 135–145)
Total Bilirubin: 1 mg/dL (ref 0.3–1.2)
Total Protein: 7.5 g/dL (ref 6.0–8.3)

## 2014-05-13 LAB — CBC WITH DIFFERENTIAL/PLATELET
Basophils Absolute: 0 10*3/uL (ref 0.0–0.1)
Basophils Relative: 0 % (ref 0–1)
Eosinophils Absolute: 0.2 10*3/uL (ref 0.0–0.7)
Eosinophils Relative: 1 % (ref 0–5)
HCT: 43.9 % (ref 39.0–52.0)
Hemoglobin: 15.5 g/dL (ref 13.0–17.0)
Lymphocytes Relative: 10 % — ABNORMAL LOW (ref 12–46)
Lymphs Abs: 1.4 10*3/uL (ref 0.7–4.0)
MCH: 30.3 pg (ref 26.0–34.0)
MCHC: 35.3 g/dL (ref 30.0–36.0)
MCV: 85.7 fL (ref 78.0–100.0)
Monocytes Absolute: 1.4 10*3/uL — ABNORMAL HIGH (ref 0.1–1.0)
Monocytes Relative: 9 % (ref 3–12)
Neutro Abs: 11.8 10*3/uL — ABNORMAL HIGH (ref 1.7–7.7)
Neutrophils Relative %: 80 % — ABNORMAL HIGH (ref 43–77)
Platelets: 170 10*3/uL (ref 150–400)
RBC: 5.12 MIL/uL (ref 4.22–5.81)
RDW: 13.3 % (ref 11.5–15.5)
WBC: 14.7 10*3/uL — ABNORMAL HIGH (ref 4.0–10.5)

## 2014-05-13 LAB — LIPASE, BLOOD: Lipase: 32 U/L (ref 11–59)

## 2014-05-13 MED ORDER — AMOXICILLIN-POT CLAVULANATE 875-125 MG PO TABS
1.0000 | ORAL_TABLET | Freq: Once | ORAL | Status: AC
  Administered 2014-05-13: 1 via ORAL
  Filled 2014-05-13: qty 1

## 2014-05-13 MED ORDER — CEFTRIAXONE SODIUM 1 G IJ SOLR
1.0000 g | Freq: Once | INTRAMUSCULAR | Status: AC
  Administered 2014-05-13: 1 g via INTRAMUSCULAR

## 2014-05-13 MED ORDER — HYDROMORPHONE HCL 1 MG/ML IJ SOLN
0.5000 mg | Freq: Once | INTRAMUSCULAR | Status: AC
Start: 2014-05-13 — End: 2014-05-13
  Administered 2014-05-13: 0.5 mg via INTRAVENOUS
  Filled 2014-05-13: qty 1

## 2014-05-13 MED ORDER — BARIUM SULFATE 2.1 % PO SUSP
450.0000 mL | ORAL | Status: AC
  Administered 2014-05-13: 450 mL via ORAL

## 2014-05-13 MED ORDER — ONDANSETRON HCL 4 MG/2ML IJ SOLN
4.0000 mg | Freq: Once | INTRAMUSCULAR | Status: AC
  Administered 2014-05-13: 4 mg via INTRAVENOUS
  Filled 2014-05-13: qty 2

## 2014-05-13 NOTE — Progress Notes (Signed)
Pre visit review using our clinic review tool, if applicable. No additional management support is needed unless otherwise documented below in the visit note. 

## 2014-05-13 NOTE — Progress Notes (Signed)
Subjective:    Patient ID: Troy Combs, male    DOB: 03/08/1932, 81 y.o.   MRN: [id number]  HPI  Here with 3-4 days onset worsening  LLQ pain (and some low mid abd as well), with radiation to the back, and also seemingly worsening midline LBP (acute on chronic) now radiating to both iliac crest areas as well.  No n/v, distension fever, chills, CP, sob, joint pain or rash.  Also with significant constipation it seems in the past wk with some straining at stool, unusual for him.  No BRB.  No hx of diverticulitis. Denies urinary symptoms such as dysuria, frequency, urgency, flank pain, hematuria or n/v, fever, chills. No hx of renal stone.  S/p bilat inguinal hernia repairs, no swelling recurrence.  Last colonoscopy April 2014 per Dr Medoff/GI - with diverticulosis/int hemhorrhoid.  No recent CT Past Medical History  Diagnosis Date  . Old myocardial infarction   . Unspecified essential hypertension   . Personal history of other diseases of digestive system   . Personal history of malignant melanoma of skin   . Dysthymic disorder   . Anemia, unspecified   . Thoracic or lumbosacral neuritis or radiculitis, unspecified   . Impotence of organic origin   . Malignant melanoma of skin of upper limb, including shoulder   . Bladder neck obstruction   . Coronary atherosclerosis of unspecified type of vessel, native or graft   . Impaired glucose tolerance 12/02/2012   Past Surgical History  Procedure Laterality Date  . Arthrodesis  07/06/2002    of left long finger distal interphalangeal joint with Kirschner wire fixation X 3  . Coronary artery bypass graft  10/17/2000    [name]. [name], MD  . Atrial ablation surgery  05/04/2001    Dr. [name]  . Coronary angioplasty  1993  . Hernia repair      BILATERAL  . Cholecystectomy    . Exploratory laparotomy      AFTER LIVER TRAUMA AND HAND SURGERY  . Cardiac catheterization  09/05/2008    Revealing 4 of 4 patent grafts with native  multivessel coronary artery disease, EF  of 60% without regional wall motion abnormalities.    reports that he quit smoking about 58 years ago. He has never used smokeless tobacco. He reports that he drinks alcohol. He reports that he does not use illicit drugs. family history includes Heart disease in his father. There is no history of Ataxia, Chorea, Dementia, Mental retardation, Migraines, Multiple sclerosis, Neurofibromatosis, Neuropathy, Parkinsonism, Seizures, or Stroke. Allergies  Allergen Reactions  . Ciprofloxacin   . Iohexol      Code: HIVES, Desc: PATENT STATES HE IS ALLERGIC TO IV DYE 09/14/08/RM, Onset Date: [id number]    Current Outpatient Prescriptions on File Prior to Visit  Medication Sig Dispense Refill  . aspirin 81 MG tablet Take 81 mg by mouth daily.    . CRESTOR 10 MG tablet TAKE 1 TABLET DAILY 90 tablet 2  . finasteride (PROSCAR) 5 MG tablet TAKE 1 TABLET DAILY 90 tablet 0  . metoprolol succinate (TOPROL-XL) 100 MG 24 hr tablet Take 1/2 tab by mouth per day 45 tablet 3  . nitroGLYCERIN (NITROSTAT) 0.4 MG SL tablet Place 1 tablet (0.4 mg total) under the tongue as directed. 30 tablet 3  . nortriptyline (PAMELOR) 25 MG capsule Take 1 capsule (25 mg total) by mouth at bedtime. 90 capsule 1  . omeprazole (PRILOSEC) 20 MG capsule Take 20 mg by mouth daily.      Marland Kitchen  sertraline (ZOLOFT) 50 MG tablet TAKE 1 TABLET DAILY 90 tablet 2  . tamsulosin (FLOMAX) 0.4 MG CAPS capsule Take 1 capsule (0.4 mg total) by mouth daily. 90 capsule 3   No current facility-administered medications on file prior to visit.   Review of Systems  Constitutional: Negative for unusual diaphoresis or other sweats  HENT: Negative for ringing in ear Eyes: Negative for double vision or worsening visual disturbance.  Respiratory: Negative for choking and stridor.   Genitourinary: Negative for hematuria or decreased urine volume.  Musculoskeletal: Negative for other MSK pain or swelling Skin: Negative for  color change and worsening wound.  Neurological: Negative for tremors and numbness other than noted  Psychiatric/Behavioral: Negative for decreased concentration or agitation other than above       Objective:   Physical Exam BP 140/82 mmHg  Pulse 85  Temp(Src) 97.3 F (36.3 C) (Oral)  Ht 5\' 9"  (1.753 m)  Wt 178 lb 6 oz (80.91 kg)  BMI 26.33 kg/m2  SpO2 95% VS noted, non toxic Constitutional: Pt appears well-developed, well-nourished.  HENT: Head: NCAT.  Right Ear: External ear normal.  Left Ear: External ear normal.  Eyes: . Pupils are equal, round, and reactive to light. Conjunctivae and EOM are normal Neck: Normal range of motion. Neck supple.  Cardiovascular: Normal rate and regular rhythm.   Pulmonary/Chest: Effort normal and breath sounds without rales or wheezing.  Abd:  Soft, ND, decreased BS, marked tender to LLQ with mild palpation only, no guarding or rebound Neurological: Pt is alert. Not confused , motor grossly intact Skin: Skin is warm. No rash Psychiatric: Pt behavior is normal. No agitation.     Assessment & Plan:

## 2014-05-13 NOTE — ED Provider Notes (Signed)
CSN: [id number]     Arrival date & time 05/13/14  1740 History   First MD Initiated Contact with Patient 05/13/14 2035     Chief Complaint  Patient presents with  . Abdominal Pain     (Consider location/radiation/quality/duration/timing/severity/associated sxs/prior Treatment) HPI Comments: Patient presents with two-day history of left lower quadrant abdominal pain which was preceded by left lower back pain for proximally 2 weeks. Patient saw his PCP today who sent him to the emergency department for evaluation. Pain is made worse with palpation, walking, movements. He was given IM Rocephin prior to leaving PCP office. Patient has history of liver surgery when he was young as well as cholecystectomy. Patient has had 2 inguinal hernia repairs. Most previous colonoscopy demonstrated diverticulosis. Patient denies fever. He has had nausea but no vomiting. He has had constipation but states that this is not necessarily unusual for him. He denies passing gas today. No abdominal distention. No urinary symptoms including dysuria, frequency, hematuria or difficulty urinating. No other treatments prior to arrival.  The history is provided by the patient, medical records and the spouse.    Past Medical History  Diagnosis Date  . Old myocardial infarction   . Unspecified essential hypertension   . Personal history of other diseases of digestive system   . Personal history of malignant melanoma of skin   . Dysthymic disorder   . Anemia, unspecified   . Thoracic or lumbosacral neuritis or radiculitis, unspecified   . Impotence of organic origin   . Malignant melanoma of skin of upper limb, including shoulder   . Bladder neck obstruction   . Coronary atherosclerosis of unspecified type of vessel, native or graft   . Impaired glucose tolerance 12/02/2012   Past Surgical History  Procedure Laterality Date  . Arthrodesis  07/06/2002    of left long finger distal interphalangeal joint with Kirschner  wire fixation X 3  . Coronary artery bypass graft  10/17/2000    [name]. [name], MD  . Atrial ablation surgery  05/04/2001    Dr. [name]  . Coronary angioplasty  1993  . Hernia repair      BILATERAL  . Cholecystectomy    . Exploratory laparotomy      AFTER LIVER TRAUMA AND HAND SURGERY  . Cardiac catheterization  09/05/2008    Revealing 4 of 4 patent grafts with native multivessel coronary artery disease, EF  of 60% without regional wall motion abnormalities.   Family History  Problem Relation Age of Onset  . Heart disease Father   . Ataxia Neg Hx   . Chorea Neg Hx   . Dementia Neg Hx   . Mental retardation Neg Hx   . Migraines Neg Hx   . Multiple sclerosis Neg Hx   . Neurofibromatosis Neg Hx   . Neuropathy Neg Hx   . Parkinsonism Neg Hx   . Seizures Neg Hx   . Stroke Neg Hx    History  Substance Use Topics  . Smoking status: Former Smoker    Quit date: 09/21/1955  . Smokeless tobacco: Never Used  . Alcohol Use: Yes     Comment: one or two ounces a week    Review of Systems  Constitutional: Negative for fever.  HENT: Negative for rhinorrhea and sore throat.   Eyes: Negative for redness.  Respiratory: Negative for cough.   Cardiovascular: Negative for chest pain.  Gastrointestinal: Positive for nausea, abdominal pain and constipation. Negative for vomiting and diarrhea.  Genitourinary: Negative  for dysuria.  Musculoskeletal: Negative for myalgias.  Skin: Negative for rash.  Neurological: Negative for headaches.    Allergies  Ciprofloxacin and Iohexol  Home Medications   Prior to Admission medications   Medication Sig Start Date End Date Taking? Authorizing Provider  aspirin 81 MG tablet Take 81 mg by mouth daily.    Historical Provider, MD  CRESTOR 10 MG tablet TAKE 1 TABLET DAILY 04/18/14   Biagio Borg, MD  finasteride (PROSCAR) 5 MG tablet TAKE 1 TABLET DAILY 07/20/10   Lew Dawes V, MD  metoprolol succinate (TOPROL-XL) 100 MG 24 hr  tablet Take 1/2 tab by mouth per day 01/20/14   Biagio Borg, MD  nitroGLYCERIN (NITROSTAT) 0.4 MG SL tablet Place 1 tablet (0.4 mg total) under the tongue as directed. 02/25/13   Biagio Borg, MD  nortriptyline (PAMELOR) 25 MG capsule Take 1 capsule (25 mg total) by mouth at bedtime. 04/04/14   Biagio Borg, MD  omeprazole (PRILOSEC) 20 MG capsule Take 20 mg by mouth daily.      Historical Provider, MD  sertraline (ZOLOFT) 50 MG tablet TAKE 1 TABLET DAILY 04/05/14   Biagio Borg, MD  tamsulosin (FLOMAX) 0.4 MG CAPS capsule Take 1 capsule (0.4 mg total) by mouth daily. 01/20/14   Biagio Borg, MD   BP 169/86 mmHg  Pulse 76  Temp(Src) 98.2 F (36.8 C)  Resp 18  Ht 5\' 10"  (1.778 m)  Wt 176 lb (79.833 kg)  BMI 25.25 kg/m2  SpO2 98%   Physical Exam  Constitutional: He appears well-developed and well-nourished.  HENT:  Head: Normocephalic and atraumatic.  Eyes: Conjunctivae are normal. Right eye exhibits no discharge. Left eye exhibits no discharge.  Neck: Normal range of motion. Neck supple.  Cardiovascular: Normal rate, regular rhythm and normal heart sounds.   Pulmonary/Chest: Effort normal and breath sounds normal.  Abdominal: Soft. He exhibits no distension and no mass. Bowel sounds are decreased. There is tenderness in the right lower quadrant, suprapubic area and left lower quadrant. There is rebound. There is no guarding, no CVA tenderness, no tenderness at McBurney's point and negative Murphy's sign.    Neurological: He is alert.  Skin: Skin is warm and dry.  Psychiatric: He has a normal mood and affect.  Nursing note and vitals reviewed.   ED Course  Procedures (including critical care time) Labs Review Labs Reviewed  CBC WITH DIFFERENTIAL - Abnormal; Notable for the following:    WBC 14.7 (*)    Neutrophils Relative % 80 (*)    Neutro Abs 11.8 (*)    Lymphocytes Relative 10 (*)    Monocytes Absolute 1.4 (*)    All other components within normal limits  COMPREHENSIVE  METABOLIC PANEL - Abnormal; Notable for the following:    Glucose, Bld 121 (*)    GFR calc non Af Amer 80 (*)    All other components within normal limits  URINALYSIS, ROUTINE W REFLEX MICROSCOPIC - Abnormal; Notable for the following:    APPearance CLOUDY (*)    All other components within normal limits  LIPASE, BLOOD    Imaging Review Ct Abdomen Pelvis Wo Contrast  05/13/2014   CLINICAL DATA:  3-4 day history of gradually worsening left lower quadrant abdominal pain radiating to the back. Surgical history includes bilateral inguinal hernia repair and repair of a liver injury many years ago.  EXAM: CT ABDOMEN AND PELVIS WITHOUT CONTRAST  TECHNIQUE: Multidetector CT imaging of the abdomen and pelvis  was performed following the standard protocol without IV contrast.  COMPARISON:  None.  FINDINGS: Wall thickening involving a short segment of the distal descending colon at its junction with the sigmoid colon in an area of diverticular disease. No extraluminal gas. No abnormal fluid collection to suggest abscess. Diverticulosis involving the remainder of the sigmoid colon without evidence of diverticulitis elsewhere. Moderate to large stool burden.  Normal appendix in the right upper pelvis. Normal appearing stomach and small bowel. Wall thickening and edema involving the distal esophagus at the EG junction. Oral contrast material in the visualized distal esophagus. No ascites.  Dystrophic calcification involving the liver related to the prior trauma. Normal unenhanced appearance of the spleen, pancreas, adrenal glands, and left kidney. Approximate 2.5 cm simple cyst arising from the otherwise normal-appearing right kidney. Moderate aortoiliofemoral atherosclerosis without aneurysm. No significant lymphadenopathy.  Moderate prostate gland enlargement, particularly the median lobe. Urinary bladder unremarkable. Numerous pelvic phleboliths. No evidence of recurrent inguinal hernia on either side.  Bone window  images demonstrate degenerative changes involving the lower thoracic spine, severe degenerative disc disease and spondylosis at L2-3, facet degenerative changes throughout the lower lumbar spine. Visualized lung bases clear apart from scarring deep in the lower lobes. Heart moderately enlarged with evidence of prior CABG. Mild left gynecomastia incidentally noted.  IMPRESSION: 1. Acute diverticulitis involving the distal descending colon at its junction with the sigmoid colon. No evidence of perforation or abscess. 2. Diverticulosis involving the remainder the sigmoid colon without evidence of acute diverticulitis elsewhere. 3. Wall thickening and edema involving the distal esophagus at the EG junction. As there is oral contrast material in the distal esophagus, GE reflux is suspected. 4. Moderate prostate gland enlargement, particularly the median lobe.   Electronically Signed   By: Evangeline Dakin M.D.   On: 05/13/2014 23:00     EKG Interpretation None      8:37 PM Patient seen and examined. Work-up initiated. Patient declines pain medication at this time.    Vital signs reviewed and are as follows: BP 169/86 mmHg  Pulse 76  Temp(Src) 98.2 F (36.8 C)  Resp 18  Ht 5\' 10"  (1.778 m)  Wt 176 lb (79.833 kg)  BMI 25.25 kg/m2  SpO2 98%     12:36 AM Patient discussed with Dr. Mingo Amber who has seen earlier.   CT reviewed by myself. Shows diverticulitis as expected. Patient was given pain medication which made patient more comfortable. He has tolerated oral medications. He is comfortable with d/c to home, follow-up with PCP next week.   The patient was urged to return to the Emergency Department immediately with worsening of current symptoms, worsening abdominal pain, persistent vomiting, blood noted in stools, fever, or any other concerns. The patient verbalized understanding.   Patient counseled on use of narcotic pain medications. Counseled not to combine these medications with others  containing tylenol. Use under supervision at lowest dose needed. Urged not to drink alcohol, drive, or perform any other activities that requires focus while taking these medications. The patient verbalizes understanding and agrees with the plan.    MDM   Final diagnoses:  LLQ pain  Acute diverticulitis   Patient with uncomplicated acute diverticulitis. No fever. Pain control. Patient tolerating oral medications. He appears well, nontoxic. Vitals are stable. He has reliable PCP follow-up. Feel patient is appropriate for discharge home. Discussed with and seen by Dr. Mingo Amber.   No dangerous or life-threatening conditions suspected or identified by history, physical exam, and by work-up. No  indications for hospitalization identified.     Carlisle Cater, PA-C 05/14/14 0041  Evelina Bucy, MD 05/14/14 [phone]

## 2014-05-13 NOTE — ED Notes (Signed)
EDP at bedside  

## 2014-05-13 NOTE — Assessment & Plan Note (Signed)
Mild elev today likely reactive, o/w stable overall by history and exam, recent data reviewed with pt, and pt to continue medical treatment as before,  to f/u any worsening symptoms or concerns BP Readings from Last 3 Encounters:  05/13/14 140/82  01/20/14 106/78  10/13/13 110/64

## 2014-05-13 NOTE — ED Notes (Signed)
The pt has had abd and back pain for approx 2 weeks.  It started first in his lower back but is now in his abd.  He was sent here by his doctor

## 2014-05-13 NOTE — Assessment & Plan Note (Signed)
Etiology unclear, pt afeb and nontoxic, hemodynamically stable it seems, but with marked LLQ tenderness that should not have deferred evaluation, as cannot r/o PSBO, diverticulitis, or constipation.  Pt understands about long ER waits, will give empiric rocephin IM now.

## 2014-05-13 NOTE — Patient Instructions (Addendum)
You had the antibiotic shot today  Please continue all other medications as before  Please go to the [facility] ER for checkin for further evaluation at this time.

## 2014-05-14 MED ORDER — AMOXICILLIN-POT CLAVULANATE 875-125 MG PO TABS: 1.0000 | ORAL_TABLET | Freq: Two times a day (BID) | ORAL | Status: DC

## 2014-05-14 MED ORDER — HYDROCODONE-ACETAMINOPHEN 5-325 MG PO TABS: ORAL_TABLET | ORAL | Status: DC

## 2014-05-14 NOTE — Discharge Instructions (Signed)
Please read and follow all provided instructions.  Your diagnoses today include:  1. Acute diverticulitis   2. LLQ pain     Tests performed today include:  Blood counts and electrolytes  Blood tests to check liver and kidney function  Blood tests to check pancreas function  Urine test to look for infection and pregnancy (in women)  CT abd/pelvis - shows diverticulitis  Vital signs. See below for your results today.   Medications prescribed:   Augmentin - antibiotic  You have been prescribed an antibiotic medicine: take the entire course of medicine even if you are feeling better. Stopping early can cause the antibiotic not to work.   Vicodin (hydrocodone/acetaminophen) - narcotic pain medication  DO NOT drive or perform any activities that require you to be awake and alert because this medicine can make you drowsy. BE VERY CAREFUL not to take multiple medicines containing Tylenol (also called acetaminophen). Doing so can lead to an overdose which can damage your liver and cause liver failure and possibly death.  Use pain medication only under direct supervision at the lowest possible dose needed to control your pain.   Take any prescribed medications only as directed.  Home care instructions:   Follow any educational materials contained in this packet.  Follow-up instructions: Please follow-up with your primary care provider in the next 3 days for further evaluation of your symptoms.    Return instructions:  SEEK IMMEDIATE MEDICAL ATTENTION IF:  The pain does not go away or becomes severe   A temperature above 101F develops   Repeated vomiting occurs (multiple episodes)   The pain becomes localized to portions of the abdomen. The right side could possibly be appendicitis. In an adult, the left lower portion of the abdomen could be colitis or diverticulitis.   Blood is being passed in stools or vomit (bright red or black tarry stools)   You develop chest pain,  difficulty breathing, dizziness or fainting, or become confused, poorly responsive, or inconsolable (young children)  If you have any other emergent concerns regarding your health  Additional Information: Abdominal (belly) pain can be caused by many things. Your caregiver performed an examination and possibly ordered blood/urine tests and imaging (CT scan, x-rays, ultrasound). Many cases can be observed and treated at home after initial evaluation in the emergency department. Even though you are being discharged home, abdominal pain can be unpredictable. Therefore, you need a repeated exam if your pain does not resolve, returns, or worsens. Most patients with abdominal pain don't have to be admitted to the hospital or have surgery, but serious problems like appendicitis and gallbladder attacks can start out as nonspecific pain. Many abdominal conditions cannot be diagnosed in one visit, so follow-up evaluations are very important.  Your vital signs today were: BP 161/82 mmHg   Pulse 75   Temp(Src) 98.2 F (36.8 C)   Resp 18   Ht 5\' 10"  (1.778 m)   Wt 176 lb (79.833 kg)   BMI 25.25 kg/m2   SpO2 97% If your blood pressure (bp) was elevated above 135/85 this visit, please have this repeated by your doctor within one month. --------------

## 2014-05-18 ENCOUNTER — Ambulatory Visit (INDEPENDENT_AMBULATORY_CARE_PROVIDER_SITE_OTHER): Payer: Medicare Other | Admitting: Internal Medicine

## 2014-05-18 ENCOUNTER — Other Ambulatory Visit: Payer: Medicare Other

## 2014-05-18 ENCOUNTER — Encounter: Payer: Self-pay | Admitting: Internal Medicine

## 2014-05-18 ENCOUNTER — Other Ambulatory Visit: Payer: Self-pay | Admitting: Internal Medicine

## 2014-05-18 VITALS — BP 128/78 | HR 75 | Temp 98.0°F | Ht 69.0 in | Wt 179.2 lb

## 2014-05-18 DIAGNOSIS — I25799 Atherosclerosis of other coronary artery bypass graft(s) with unspecified angina pectoris: Secondary | ICD-10-CM

## 2014-05-18 DIAGNOSIS — K5792 Diverticulitis of intestine, part unspecified, without perforation or abscess without bleeding: Secondary | ICD-10-CM

## 2014-05-18 DIAGNOSIS — I25810 Atherosclerosis of coronary artery bypass graft(s) without angina pectoris: Secondary | ICD-10-CM

## 2014-05-18 DIAGNOSIS — R197 Diarrhea, unspecified: Secondary | ICD-10-CM

## 2014-05-18 DIAGNOSIS — I951 Orthostatic hypotension: Secondary | ICD-10-CM

## 2014-05-18 NOTE — Assessment & Plan Note (Signed)
Since just had hosp exposure, to check c diff toxin by pcr x 1

## 2014-05-18 NOTE — Assessment & Plan Note (Signed)
Cl;inically improved, to finish augmentin, unless diarrhea worsens, then to call

## 2014-05-18 NOTE — Assessment & Plan Note (Signed)
Asympt, refer to cardiology per pt request

## 2014-05-18 NOTE — Progress Notes (Signed)
Pre visit review using our clinic review tool, if applicable. No additional management support is needed unless otherwise documented below in the visit note. 

## 2014-05-18 NOTE — Progress Notes (Signed)
Subjective:    Patient ID: Troy Combs, male    DOB: Feb 17, 1932, 81 y.o.   MRN: [id number]  HPI  Here after seen jan 20 at this office, referred to ER for evaluation, with finding of uncomplicated acute diverticulitis, tx with rocephin IM here, as well as augmentin bid course per EDP.  Overall doing quite nicely at least in just the last 1-2 days as pain has remarkably decreased to LLQ, no fever, and stamina, appetite improved.  Does however has new onset diarrhea, no blood, and Denies worsening reflux, abd pain, dysphagia, n/v,or blood. Pt denies chest pain, increased sob or doe, wheezing, orthopnea, PND, increased LE swelling, palpitations, dizziness or syncope, but does have dizziness to stand too quickly, hx of orthostasis, and recent diverticulitis as above.  No falls or worsening weakness.  Has seen Dr Verl Blalock in past who has taken on other responsibilities and less available, pt now asking for referral back to cardiology as has not been seen recent Past Medical History  Diagnosis Date  . Old myocardial infarction   . Unspecified essential hypertension   . Personal history of other diseases of digestive system   . Personal history of malignant melanoma of skin   . Dysthymic disorder   . Anemia, unspecified   . Thoracic or lumbosacral neuritis or radiculitis, unspecified   . Impotence of organic origin   . Malignant melanoma of skin of upper limb, including shoulder   . Bladder neck obstruction   . Coronary atherosclerosis of unspecified type of vessel, native or graft   . Impaired glucose tolerance 12/02/2012   Past Surgical History  Procedure Laterality Date  . Arthrodesis  07/06/2002    of left long finger distal interphalangeal joint with Kirschner wire fixation X 3  . Coronary artery bypass graft  10/17/2000    [name]. [name], MD  . Atrial ablation surgery  05/04/2001    Dr. [name]  . Coronary angioplasty  1993  . Hernia repair      BILATERAL  . Cholecystectomy     . Exploratory laparotomy      AFTER LIVER TRAUMA AND HAND SURGERY  . Cardiac catheterization  09/05/2008    Revealing 4 of 4 patent grafts with native multivessel coronary artery disease, EF  of 60% without regional wall motion abnormalities.    reports that he quit smoking about 58 years ago. He has never used smokeless tobacco. He reports that he drinks alcohol. He reports that he does not use illicit drugs. family history includes Heart disease in his father. There is no history of Ataxia, Chorea, Dementia, Mental retardation, Migraines, Multiple sclerosis, Neurofibromatosis, Neuropathy, Parkinsonism, Seizures, or Stroke. Allergies  Allergen Reactions  . Ciprofloxacin   . Iohexol      Code: HIVES, Desc: PATENT STATES HE IS ALLERGIC TO IV DYE 09/14/08/RM, Onset Date: [id number]    Current Outpatient Prescriptions on File Prior to Visit  Medication Sig Dispense Refill  . amoxicillin-clavulanate (AUGMENTIN) 875-125 MG per tablet Take 1 tablet by mouth every 12 (twelve) hours. 14 tablet 0  . aspirin 81 MG tablet Take 81 mg by mouth daily.    . CRESTOR 10 MG tablet TAKE 1 TABLET DAILY 90 tablet 2  . finasteride (PROSCAR) 5 MG tablet TAKE 1 TABLET DAILY 90 tablet 0  . HYDROcodone-acetaminophen (NORCO/VICODIN) 5-325 MG per tablet Take 0.5-1 tablets every 6 hours as needed for severe pain. 10 tablet 0  . metoprolol succinate (TOPROL-XL) 100 MG 24 hr tablet Take  1/2 tab by mouth per day 45 tablet 3  . nitroGLYCERIN (NITROSTAT) 0.4 MG SL tablet Place 1 tablet (0.4 mg total) under the tongue as directed. 30 tablet 3  . nortriptyline (PAMELOR) 25 MG capsule Take 1 capsule (25 mg total) by mouth at bedtime. 90 capsule 1  . omeprazole (PRILOSEC) 20 MG capsule Take 20 mg by mouth daily.      . sertraline (ZOLOFT) 50 MG tablet TAKE 1 TABLET DAILY 90 tablet 2  . tamsulosin (FLOMAX) 0.4 MG CAPS capsule Take 1 capsule (0.4 mg total) by mouth daily. 90 capsule 3   No current facility-administered  medications on file prior to visit.   Review of Systems  Constitutional: Negative for unusual diaphoresis or other sweats  HENT: Negative for ringing in ear Eyes: Negative for double vision or worsening visual disturbance.  Respiratory: Negative for choking and stridor.   Gastrointestinal: Negative for vomiting or other signifcant bowel change Genitourinary: Negative for hematuria or decreased urine volume.  Musculoskeletal: Negative for other MSK pain or swelling Skin: Negative for color change and worsening wound.  Neurological: Negative for tremors and numbness other than noted  Psychiatric/Behavioral: Negative for decreased concentration or agitation other than above       Objective   Physical Exam BP 128/78 mmHg  Pulse 75  Temp(Src) 98 F (36.7 C) (Oral)  Ht 5\' 9"  (1.753 m)  Wt 179 lb 4 oz (81.307 kg)  BMI 26.46 kg/m2  SpO2 94% VS noted, not ill appearing Constitutional: Pt appears well-developed, well-nourished.  HENT: Head: NCAT.  Right Ear: External ear normal.  Left Ear: External ear normal.  Eyes: . Pupils are equal, round, and reactive to light. Conjunctivae and EOM are normal Neck: Normal range of motion. Neck supple.  Cardiovascular: Normal rate and regular rhythm.   Pulmonary/Chest: Effort normal and breath sounds without rales or wheezing.  Abd:  Soft, NT, ND, + BS Neurological: Pt is alert. Not confused , motor grossly intact Skin: Skin is warm. No rash Psychiatric: Pt behavior is normal. No agitation.      Assessment & Plan:

## 2014-05-18 NOTE — Assessment & Plan Note (Signed)
To avoid dehydration,  to f/u any worsening symptoms or concerns

## 2014-05-18 NOTE — Patient Instructions (Addendum)
Please continue all other medications as before.  Please call if the diarrhea becomes "too much"  Please have the pharmacy call with any other refills you may need.  Please keep your appointments with your specialists as you may have planned  You will be contacted regarding the referral for: cardiology  Please go to the LAB in the Basement (turn left off the elevator) for the tests to be done today - just the c diff test  You will be contacted by phone if any changes need to be made immediately.  Otherwise, you will receive a letter about your results with an explanation, but please check with MyChart first.  Please remember to sign up for MyChart if you have not done so, as this will be important to you in the future with finding out test results, communicating by private email, and scheduling acute appointments online when needed.

## 2014-05-19 ENCOUNTER — Encounter (HOSPITAL_COMMUNITY): Payer: Self-pay | Admitting: Emergency Medicine

## 2014-05-19 ENCOUNTER — Telehealth: Payer: Self-pay | Admitting: Internal Medicine

## 2014-05-19 ENCOUNTER — Emergency Department (HOSPITAL_COMMUNITY)
Admission: EM | Admit: 2014-05-19 | Discharge: 2014-05-19 | Disposition: A | Discharge status: Home / Self Care | Payer: Medicare Other | Attending: Emergency Medicine | Admitting: Emergency Medicine

## 2014-05-19 DIAGNOSIS — I10 Essential (primary) hypertension: Secondary | ICD-10-CM | POA: Diagnosis not present

## 2014-05-19 DIAGNOSIS — Z862 Personal history of diseases of the blood and blood-forming organs and certain disorders involving the immune mechanism: Secondary | ICD-10-CM | POA: Diagnosis not present

## 2014-05-19 DIAGNOSIS — I951 Orthostatic hypotension: Secondary | ICD-10-CM | POA: Diagnosis not present

## 2014-05-19 DIAGNOSIS — Z951 Presence of aortocoronary bypass graft: Secondary | ICD-10-CM | POA: Diagnosis not present

## 2014-05-19 DIAGNOSIS — Z8659 Personal history of other mental and behavioral disorders: Secondary | ICD-10-CM | POA: Diagnosis not present

## 2014-05-19 DIAGNOSIS — I252 Old myocardial infarction: Secondary | ICD-10-CM | POA: Diagnosis not present

## 2014-05-19 DIAGNOSIS — R1032 Left lower quadrant pain: Secondary | ICD-10-CM | POA: Insufficient documentation

## 2014-05-19 DIAGNOSIS — Z7982 Long term (current) use of aspirin: Secondary | ICD-10-CM | POA: Insufficient documentation

## 2014-05-19 DIAGNOSIS — Z85820 Personal history of malignant melanoma of skin: Secondary | ICD-10-CM | POA: Diagnosis not present

## 2014-05-19 DIAGNOSIS — Z9889 Other specified postprocedural states: Secondary | ICD-10-CM | POA: Insufficient documentation

## 2014-05-19 DIAGNOSIS — Z792 Long term (current) use of antibiotics: Secondary | ICD-10-CM | POA: Diagnosis not present

## 2014-05-19 DIAGNOSIS — Z9861 Coronary angioplasty status: Secondary | ICD-10-CM | POA: Insufficient documentation

## 2014-05-19 DIAGNOSIS — Z87891 Personal history of nicotine dependence: Secondary | ICD-10-CM | POA: Insufficient documentation

## 2014-05-19 DIAGNOSIS — R42 Dizziness and giddiness: Secondary | ICD-10-CM | POA: Diagnosis present

## 2014-05-19 DIAGNOSIS — Z79899 Other long term (current) drug therapy: Secondary | ICD-10-CM | POA: Insufficient documentation

## 2014-05-19 DIAGNOSIS — E860 Dehydration: Secondary | ICD-10-CM

## 2014-05-19 DIAGNOSIS — I2510 Atherosclerotic heart disease of native coronary artery without angina pectoris: Secondary | ICD-10-CM | POA: Diagnosis not present

## 2014-05-19 LAB — COMPREHENSIVE METABOLIC PANEL
ALT: 25 U/L (ref 0–53)
AST: 26 U/L (ref 0–37)
Albumin: 4 g/dL (ref 3.5–5.2)
Alkaline Phosphatase: 45 U/L (ref 39–117)
Anion gap: 6 (ref 5–15)
BUN: 15 mg/dL (ref 6–23)
CO2: 30 mmol/L (ref 19–32)
Calcium: 9.6 mg/dL (ref 8.4–10.5)
Chloride: 105 mEq/L (ref 96–112)
Creatinine, Ser: 0.87 mg/dL (ref 0.50–1.35)
GFR calc Af Amer: >90 mL/min (ref >90)
GFR calc non Af Amer: 79 mL/min — ABNORMAL LOW (ref >90)
Glucose, Bld: 110 mg/dL — ABNORMAL HIGH (ref 70–99)
Potassium: 4.5 mmol/L (ref 3.5–5.1)
Sodium: 141 mmol/L (ref 135–145)
Total Bilirubin: 0.7 mg/dL (ref 0.3–1.2)
Total Protein: 6.6 g/dL (ref 6.0–8.3)

## 2014-05-19 LAB — TYPE AND SCREEN
ABO/RH(D): O POS
Antibody Screen: POSITIVE
DAT, IgG: NEGATIVE
PT AG Type: POSITIVE

## 2014-05-19 LAB — CBC
HCT: 42.4 % (ref 39.0–52.0)
Hemoglobin: 14.6 g/dL (ref 13.0–17.0)
MCH: 30 pg (ref 26.0–34.0)
MCHC: 34.4 g/dL (ref 30.0–36.0)
MCV: 87.1 fL (ref 78.0–100.0)
Platelets: 165 10*3/uL (ref 150–400)
RBC: 4.87 MIL/uL (ref 4.22–5.81)
RDW: 13.1 % (ref 11.5–15.5)
WBC: 9.1 10*3/uL (ref 4.0–10.5)

## 2014-05-19 LAB — C. DIFFICILE GDH AND TOXIN A/B
C. difficile GDH: NOT DETECTED
C. difficile Toxin A/B: NOT DETECTED
Source: 0

## 2014-05-19 LAB — POC OCCULT BLOOD, ED: Fecal Occult Bld: NEGATIVE

## 2014-05-19 MED ORDER — SODIUM CHLORIDE 0.9 % IV BOLUS (SEPSIS)
500.0000 mL | Freq: Once | INTRAVENOUS | Status: AC
  Administered 2014-05-19: 500 mL via INTRAVENOUS

## 2014-05-19 MED ORDER — SODIUM CHLORIDE 0.9 % IV BOLUS (SEPSIS)
1000.0000 mL | Freq: Once | INTRAVENOUS | Status: AC
  Administered 2014-05-19: 1000 mL via INTRAVENOUS

## 2014-05-19 NOTE — ED Provider Notes (Signed)
Medical screening examination/treatment/procedure(s) were conducted as a shared visit with non-physician practitioner(s) and myself.  I personally evaluated the patient during the encounter.   EKG Interpretation None     Patient here with dark stools. Was recently discharged from the hospital after treatment for diverticulitis. Patient's hemoglobin is stable at 14.6 and his renal function is normal. He has slightly orthostatic and will be given IV fluids here and likely discharged  Leota Jacobsen, MD 05/19/14 [phone]

## 2014-05-19 NOTE — ED Notes (Addendum)
Pt sent here by pcp for further evaluation of black stool. Pt c/o dizziness and appears to be pale. Pt ambulatory, skin warm and dry. Pt tx for diverticulitis last Friday.

## 2014-05-19 NOTE — Telephone Encounter (Signed)
Patient informed PCP wants the him to proceed to the ER as instructed.. The patient did agree and will go to [facility].

## 2014-05-19 NOTE — ED Notes (Signed)
MD at bedside. 

## 2014-05-19 NOTE — Discharge Instructions (Signed)
Dehydration, Adult Dehydration is when you lose more fluids from the body than you take in. Vital organs like the kidneys, brain, and heart cannot function without a proper amount of fluids and salt. Any loss of fluids from the body can cause dehydration.  CAUSES   Vomiting.  Diarrhea.  Excessive sweating.  Excessive urine output.  Fever. SYMPTOMS  Mild dehydration  Thirst.  Dry lips.  Slightly dry mouth. Moderate dehydration  Very dry mouth.  Sunken eyes.  Skin does not bounce back quickly when lightly pinched and released.  Dark urine and decreased urine production.  Decreased tear production.  Headache. Severe dehydration  Very dry mouth.  Extreme thirst.  Rapid, weak pulse (more than 100 beats per minute at rest).  Cold hands and feet.  Not able to sweat in spite of heat and temperature.  Rapid breathing.  Blue lips.  Confusion and lethargy.  Difficulty being awakened.  Minimal urine production.  No tears. DIAGNOSIS  Your caregiver will diagnose dehydration based on your symptoms and your exam. Blood and urine tests will help confirm the diagnosis. The diagnostic evaluation should also identify the cause of dehydration. TREATMENT  Treatment of mild or moderate dehydration can often be done at home by increasing the amount of fluids that you drink. It is best to drink small amounts of fluid more often. Drinking too much at one time can make vomiting worse. Refer to the home care instructions below. Severe dehydration needs to be treated at the hospital where you will probably be given intravenous (IV) fluids that contain water and electrolytes. HOME CARE INSTRUCTIONS   Ask your caregiver about specific rehydration instructions.  Drink enough fluids to keep your urine clear or pale yellow.  Drink small amounts frequently if you have nausea and vomiting.  Eat as you normally do.  Avoid:  Foods or drinks high in sugar.  Carbonated  drinks.  Juice.  Extremely hot or cold fluids.  Drinks with caffeine.  Fatty, greasy foods.  Alcohol.  Tobacco.  Overeating.  Gelatin desserts.  Wash your hands well to avoid spreading bacteria and viruses.  Only take over-the-counter or prescription medicines for pain, discomfort, or fever as directed by your caregiver.  Ask your caregiver if you should continue all prescribed and over-the-counter medicines.  Keep all follow-up appointments with your caregiver. SEEK MEDICAL CARE IF:  You have abdominal pain and it increases or stays in one area (localizes).  You have a rash, stiff neck, or severe headache.  You are irritable, sleepy, or difficult to awaken.  You are weak, dizzy, or extremely thirsty. SEEK IMMEDIATE MEDICAL CARE IF:   You are unable to keep fluids down or you get worse despite treatment.  You have frequent episodes of vomiting or diarrhea.  You have blood or green matter (bile) in your vomit.  You have blood in your stool or your stool looks black and tarry.  You have not urinated in 6 to 8 hours, or you have only urinated a small amount of very dark urine.  You have a fever.  You faint. MAKE SURE YOU:   Understand these instructions.  Will watch your condition.  Will get help right away if you are not doing well or get worse. Document Released: 04/15/2005 Document Revised: 07/08/2011 Document Reviewed: 12/03/2010 Exit[employer] Patient Information 2015 [employer]. This information is not intended to replace advice given to you by your health care provider. Make sure you discuss any questions you have with your health care   provider.  

## 2014-05-19 NOTE — ED Provider Notes (Signed)
CSN: [id number]     Arrival date & time 05/19/14  1545 History   First MD Initiated Contact with Patient 05/19/14 1653     Chief Complaint  Patient presents with  . Melena     (Consider location/radiation/quality/duration/timing/severity/associated sxs/prior Treatment) HPI    PCP: [name], MD Blood pressure 125/66, pulse 71, temperature 97.6 F (36.4 C), temperature source Oral, resp. rate 16, height 5\' 10"  (1.778 m), weight 170 lb (77.111 kg), SpO2 96 %.  Troy Combs is a 81 y.o.male with a significant PMH of MI, hypertension, depression, anemia, diverticulitis (in hospital last week Friday discharged this past Monday 1/18)  presents to the ER with complaints of melena sent from the PCP office. He reports having an episode of dark tarry stool last night before bed x 2. This morning he has not had any bowels movements. Denies any BRB. He is no longer having any abdominal pain from his diverticulitis from last week but endorses some mild tenderness.  His physician was afraid that he was having rectal bleeding, seemed a bit pale and having dizziness so he sent him to the ED for further evaluation.  He is also having dizziness. The dizziness is transient. He feels dizzy when he sits and it is exacerbated by standing. He has been able to walk and do his daily activities, denies near syncope. Reports his symptoms as mild today- last episode of severe denies was 3 days ago. Denies headache, fevers, neck pain,N/V, tingling, blurry vision, SOB, CP, weakness (focal or generalized)   Past Medical History  Diagnosis Date  . Old myocardial infarction   . Unspecified essential hypertension   . Personal history of other diseases of digestive system   . Personal history of malignant melanoma of skin   . Dysthymic disorder   . Anemia, unspecified   . Thoracic or lumbosacral neuritis or radiculitis, unspecified   . Impotence of organic origin   . Malignant melanoma of skin of upper limb,  including shoulder   . Bladder neck obstruction   . Coronary atherosclerosis of unspecified type of vessel, native or graft   . Impaired glucose tolerance 12/02/2012   Past Surgical History  Procedure Laterality Date  . Arthrodesis  07/06/2002    of left long finger distal interphalangeal joint with Kirschner wire fixation X 3  . Coronary artery bypass graft  10/17/2000    [name]. [name], MD  . Atrial ablation surgery  05/04/2001    Dr. [name]  . Coronary angioplasty  1993  . Hernia repair      BILATERAL  . Cholecystectomy    . Exploratory laparotomy      AFTER LIVER TRAUMA AND HAND SURGERY  . Cardiac catheterization  09/05/2008    Revealing 4 of 4 patent grafts with native multivessel coronary artery disease, EF  of 60% without regional wall motion abnormalities.   Family History  Problem Relation Age of Onset  . Heart disease Father   . Ataxia Neg Hx   . Chorea Neg Hx   . Dementia Neg Hx   . Mental retardation Neg Hx   . Migraines Neg Hx   . Multiple sclerosis Neg Hx   . Neurofibromatosis Neg Hx   . Neuropathy Neg Hx   . Parkinsonism Neg Hx   . Seizures Neg Hx   . Stroke Neg Hx    History  Substance Use Topics  . Smoking status: Former Smoker    Quit date: 09/21/1955  . Smokeless tobacco:  Never Used  . Alcohol Use: Yes     Comment: one or two ounces a week    Review of Systems  10 Systems reviewed and are negative for acute change except as noted in the HPI.    Allergies  Ciprofloxacin and Iohexol  Home Medications   Prior to Admission medications   Medication Sig Start Date End Date Taking? Authorizing Provider  amoxicillin-clavulanate (AUGMENTIN) 875-125 MG per tablet Take 1 tablet by mouth every 12 (twelve) hours. 05/14/14  Yes Carlisle Cater, PA-C  aspirin 81 MG tablet Take 81 mg by mouth daily.   Yes Historical Provider, MD  CRESTOR 10 MG tablet TAKE 1 TABLET DAILY 04/18/14  Yes Biagio Borg, MD  finasteride (PROSCAR) 5 MG tablet TAKE 1 TABLET  DAILY 07/20/10  Yes Aleksei Plotnikov V, MD  HYDROcodone-acetaminophen (NORCO/VICODIN) 5-325 MG per tablet Take 0.5-1 tablets every 6 hours as needed for severe pain. 05/14/14  Yes Carlisle Cater, PA-C  metoprolol succinate (TOPROL-XL) 100 MG 24 hr tablet Take 1/2 tab by mouth per day 01/20/14  Yes Biagio Borg, MD  nitroGLYCERIN (NITROSTAT) 0.4 MG SL tablet Place 1 tablet (0.4 mg total) under the tongue as directed. 02/25/13  Yes Biagio Borg, MD  nortriptyline (PAMELOR) 25 MG capsule Take 1 capsule (25 mg total) by mouth at bedtime. 04/04/14  Yes Biagio Borg, MD  omeprazole (PRILOSEC) 20 MG capsule Take 20 mg by mouth daily.     Yes Historical Provider, MD  sertraline (ZOLOFT) 50 MG tablet TAKE 1 TABLET DAILY 04/05/14  Yes Biagio Borg, MD  tamsulosin (FLOMAX) 0.4 MG CAPS capsule Take 1 capsule (0.4 mg total) by mouth daily. 01/20/14  Yes Biagio Borg, MD   BP 142/72 mmHg  Pulse 64  Temp(Src) 97.6 F (36.4 C) (Oral)  Resp 20  Ht 5\' 10"  (1.778 m)  Wt 170 lb (77.111 kg)  BMI 24.39 kg/m2  SpO2 94% Physical Exam  Constitutional: He appears well-developed and well-nourished. No distress.  HENT:  Head: Normocephalic and atraumatic.  Eyes: Pupils are equal, round, and reactive to light.  Neck: Normal range of motion. Neck supple.  Cardiovascular: Normal rate and regular rhythm.   Pulmonary/Chest: Effort normal.  Abdominal: Soft. Bowel sounds are normal. He exhibits no distension. There is tenderness (sharp, moderate pain with palpation- no pain at rest) in the left lower quadrant. There is no rigidity, no rebound, no guarding and no CVA tenderness.  Neurological: He is alert.  Cranial nerves II-VIII and X-XII evaluated and show no deficits. Pt alert and oriented x 3 Upper and lower extremity strength is symmetrical and physiologic Normal muscular tone No facial droop Coordination intact, no limb ataxia, finger-nose-finger normal Rapid alternating movements normal No pronator drift  Skin:  Skin is warm and dry.  Nursing note and vitals reviewed.   ED Course  Procedures (including critical care time) Labs Review Labs Reviewed  COMPREHENSIVE METABOLIC PANEL - Abnormal; Notable for the following:    Glucose, Bld 110 (*)    GFR calc non Af Amer 79 (*)    All other components within normal limits  CBC  POC OCCULT BLOOD, ED  TYPE AND SCREEN    Imaging Review No results found.   EKG Interpretation None      MDM   Final diagnoses:  Orthostatic dizziness  Dehydration    1840 Pt with complaint of dark tar-like stools x4d after admission for diverticulitis. He is fecal occult negative with reassuring labs to  rule out anemia. He does have mild positional dizziness complaint with positive orthostatic findings. Suspect dehydration. He will be hydrated,ambulated, and orthostatics re-checked before d/c.  2025 Pt no longer has positive orthostatic findings and complains of no dizziness from sitting to standing position. He also has no dizziness while ambulating. This is reassuring for dehydration Dx. He was given strict return precautions and acknowledges understanding and agrees to plan.  Pt had complaints of tarry stool but normal exam here in the ED, he can follow-up with his PCP regarding this complaint as he needs no emergent intervention at this time.  81 y.o.Talmage Coin Heumann's evaluation in the Emergency Department is complete. It has been determined that no acute conditions requiring further emergency intervention are present at this time. The patient/guardian have been advised of the diagnosis and plan. We have discussed signs and symptoms that warrant return to the ED, such as changes or worsening in symptoms.  Vital signs are stable at discharge. Filed Vitals:   05/19/14 2015  BP: 142/72  Pulse: 64  Temp:   Resp: 20    Patient/guardian has voiced understanding and agreed to follow-up with the PCP or specialist.    Linus Mako, PA-C 05/19/14  2029  Leota Jacobsen, MD 05/22/14 [phone]

## 2014-05-19 NOTE — Telephone Encounter (Signed)
C diff test was negative, so no need for change of treatment related to that  Black stool is a different issue, and with his recent diverticulitis, we have to consider this might be related to active GI bleeding (stomach, or lower GI even)  Since we are not able to treat this in the office if this were true, pt needs to go to ER now for CBC blood count and stool check for blood.  Depending on the findings, some patients are then admitted to the hospital to monitor for further bleeding, with transfusion if needed.

## 2014-05-19 NOTE — Telephone Encounter (Signed)
Patient informed of PCP instructions... The patient did agree to all instructions.  The patient stated he did drop off a stool samples yesterday and he would like to know if this would change his instructions  Or should he go ahead to the ER as instructed.

## 2014-05-19 NOTE — ED Notes (Signed)
Pt remains monitored by blood pressure, pulse ox, and 12 lead. pts family remains at bedside.  

## 2014-05-19 NOTE — Telephone Encounter (Signed)
Pt called stated that he still has diarrhea and he was told to call back and get c-diff injection. 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

## 2014-05-19 NOTE — ED Notes (Signed)
Pt placed into gown and onto monitor upon arrival to room. Pt monitored by blood pressure and pulse ox. Pts family at bedside.

## 2014-07-12 ENCOUNTER — Ambulatory Visit (INDEPENDENT_AMBULATORY_CARE_PROVIDER_SITE_OTHER): Payer: Medicare Other | Admitting: Cardiology

## 2014-07-12 ENCOUNTER — Encounter: Payer: Self-pay | Admitting: Cardiology

## 2014-07-12 VITALS — HR 63 | Ht 69.0 in | Wt 173.4 lb

## 2014-07-12 DIAGNOSIS — E782 Mixed hyperlipidemia: Secondary | ICD-10-CM

## 2014-07-12 DIAGNOSIS — R0609 Other forms of dyspnea: Secondary | ICD-10-CM

## 2014-07-12 DIAGNOSIS — I25708 Atherosclerosis of coronary artery bypass graft(s), unspecified, with other forms of angina pectoris: Secondary | ICD-10-CM

## 2014-07-12 DIAGNOSIS — R0600 Dyspnea, unspecified: Secondary | ICD-10-CM

## 2014-07-12 DIAGNOSIS — I951 Orthostatic hypotension: Secondary | ICD-10-CM

## 2014-07-12 DIAGNOSIS — I10 Essential (primary) hypertension: Secondary | ICD-10-CM

## 2014-07-12 DIAGNOSIS — R079 Chest pain, unspecified: Secondary | ICD-10-CM

## 2014-07-12 MED ORDER — METOPROLOL SUCCINATE ER 25 MG PO TB24: 25.0000 mg | ORAL_TABLET | Freq: Every day | ORAL | Status: DC

## 2014-07-12 NOTE — Patient Instructions (Addendum)
Your physician has recommended you make the following change in your medication:   Stacy TO 25 MG ONCE DAILY      Your physician has requested that you have en exercise stress myoview. For further information please visit [email or web address]. Please follow instruction sheet, as given.    Your physician recommends that you schedule a follow-up appointment in: [place]

## 2014-07-12 NOTE — Progress Notes (Signed)
Patient ID: Troy Combs, male   DOB: 1931-08-05, 81 y.o.   MRN: [id number]      Cardiology Office Note   Date:  07/12/2014   ID:  Troy Combs, DOB 12/25/1931, MRN [id number]  PCP:  [name], MD  Cardiologist:  Former patient of Dr. 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 The last cath in 5/10 with patent L-LAD, S-RCA, S-OM1/OM2, EF 60%, atrial flutter, known orthostatic hypotension.  His last echo in 4/12: Ef 55-65%, grade 1 diast dysfxn, mild focal basal hypertrophy of the septum, mild LAE. Last myoview 8/10: apical thinning with mild apical ischemia, EF 65% (low risk).  In the past his Toprol XL was increased to 100 mg PO daily for symptomatic PVCs. Recently decreased to 50 mg po daily for fatigue and dizziness.  He comes today and denies any chest pain, however he exercises on almost daily basis and has noticed that he gets profoundly more SOB on exertion and also feels dizzy.  He has known orthostatic hypotension, he was advised to increase fluid and salt intake in the past but he hasn't been doing it. He doesn't like to drink water and drinks a lot coffee. No syncope. One one occasion he noticed short episode of HR 150 BPM but no presyncope of syncope.     Past Medical History  Diagnosis Date  . Old myocardial infarction   . Unspecified essential hypertension   . Personal history of other diseases of digestive system   . Personal history of malignant melanoma of skin   . Dysthymic disorder   . Anemia, unspecified   . Thoracic or lumbosacral neuritis or radiculitis, unspecified   . Impotence of organic origin   . Malignant melanoma of skin of upper limb, including shoulder   . Bladder neck obstruction   . Coronary  atherosclerosis of unspecified type of vessel, native or graft   . Impaired glucose tolerance 12/02/2012    Past Surgical History  Procedure Laterality Date  . Arthrodesis  07/06/2002    of left long finger distal interphalangeal joint with Kirschner wire fixation X 3  . Coronary artery bypass graft  10/17/2000    [name]. [name], MD  . Atrial ablation surgery  05/04/2001    Dr. [name]  . Coronary angioplasty  1993  . Hernia repair      BILATERAL  . Cholecystectomy    . Exploratory laparotomy      AFTER LIVER TRAUMA AND HAND SURGERY  . Cardiac catheterization  09/05/2008    Revealing 4 of 4 patent grafts with native multivessel coronary artery disease, EF  of 60% without regional wall motion abnormalities.     Current Outpatient Prescriptions  Medication Sig Dispense Refill  . aspirin 81 MG tablet Take 81 mg by mouth daily.    . CRESTOR 10 MG tablet TAKE 1 TABLET DAILY 90 tablet 2  . finasteride (PROSCAR) 5 MG tablet TAKE 1 TABLET DAILY 90 tablet 0  . metoprolol succinate (TOPROL-XL) 100 MG 24 hr tablet Take 1/2 tab by mouth per day 45 tablet 3  . nitroGLYCERIN (NITROSTAT) 0.4 MG SL tablet Place 1 tablet (0.4 mg total) under the tongue as directed. 30 tablet  3  . nortriptyline (PAMELOR) 25 MG capsule Take 1 capsule (25 mg total) by mouth at bedtime. 90 capsule 1  . omeprazole (PRILOSEC) 20 MG capsule Take 20 mg by mouth daily.      . sertraline (ZOLOFT) 50 MG tablet TAKE 1 TABLET DAILY 90 tablet 2  . tamsulosin (FLOMAX) 0.4 MG CAPS capsule Take 1 capsule (0.4 mg total) by mouth daily. 90 capsule 3   No current facility-administered medications for this visit.    Allergies:   Iohexol and Ciprofloxacin    Social History:  The patient  reports that he quit smoking about 58 years ago. He has never used smokeless tobacco. He reports that he drinks alcohol. He reports that he does not use illicit drugs.   Family History:  The patient's family history includes Heart disease  in his father. There is no history of Ataxia, Chorea, Dementia, Mental retardation, Migraines, Multiple sclerosis, Neurofibromatosis, Neuropathy, Parkinsonism, Seizures, or Stroke.    ROS:  Please see the history of present illness. All other systems are reviewed and negative.    PHYSICAL EXAM: VS:  Pulse 63  Ht 5\' 9"  (1.753 m)  Wt 173 lb 6.4 oz (78.654 kg)  BMI 25.60 kg/m2 , BMI Body mass index is 25.6 kg/(m^2). GEN: Well nourished, well developed, in no acute distress HEENT: normal Neck: no JVD, carotid bruits, or masses Cardiac: RRR; no murmurs, rubs, or gallops,no edema  Respiratory:  clear to auscultation bilaterally, normal work of breathing GI: soft, nontender, nondistended, + BS MS: no deformity or atrophy Skin: warm and dry, no rash Neuro:  Strength and sensation are intact Psych: euthymic mood, full affect   EKG:  EKG is ordered today. The ekg ordered today demonstrates SR, normal ECG, HR 63 BPM   Recent Labs: 01/20/2014: TSH 1.14 05/19/2014: ALT 25; BUN 15; Creatinine 0.87; Hemoglobin 14.6; Platelets 165; Potassium 4.5; Sodium 141    Lipid Panel    Component Value Date/Time   CHOL 121 01/20/2014 1538   TRIG 188.0* 01/20/2014 1538   TRIG 83 03/18/2006 0839   HDL 39.20 01/20/2014 1538   CHOLHDL 3 01/20/2014 1538   CHOLHDL 2.9 CALC 03/18/2006 0839   VLDL 37.6 01/20/2014 1538   LDLCALC 44 01/20/2014 1538   Wt Readings from Last 3 Encounters:  07/12/14 173 lb 6.4 oz (78.654 kg)  05/19/14 170 lb (77.111 kg)  05/18/14 179 lb 4 oz (81.307 kg)    Echocardiogram: 08/16/2010 Study Conclusions  - Left ventricle: The cavity size was normal. There was mild focal basal hypertrophy of the septum. Systolic function was normal. The estimated ejection fraction was in the range of 55% to 65%. Wall motion was normal; there were no regional wall motion abnormalities. Doppler parameters are consistent with abnormal left ventricular relaxation (grade 1 diastolic  dysfunction). - Aortic valve: Trivial regurgitation. - Mitral valve: Calcified annulus. - Left atrium: The atrium was mildly dilated. - Atrial septum: There was redundancy of the septum, with borderline criteria for aneurysm. - Pulmonary arteries: Systolic pressure was mildly increased. - Pericardium, extracardiac: A trivial pericardial effusion was identified.  Other studies Reviewed: Additional studies/ records that were reviewed today include: echocardiogram    ASSESSMENT AND PLAN:  Very pleasant, younger appearing 81 year old male  1. CAD, s/p CABG, now exertional dyspnea and dizziness - we will schedule an exercise nuclear stress test to rule out ischemia. The reason for nuclear test that there is a high chance that he won't be able to achieve target  HR and conversion to lexiscan is probable.  - continue ASA, crestor - decrease toprol XL to 25 mg po daily, hold on the day of stress test  2. Orthostatic hypotension  -  - the patient is advised to increase fluid and electrolytes intake (Gatorade, Nuun electrolytes pills) - fluid before exercise   3. Exertional dizziness - we will decrease toprol XL from 50 to 25 mg po daily, as his low circulating volume (dehydration) is coupling with inability to rise HR  4. HLP - at goal on crestor 10 mg po daily  Follow up in 4 weeks,   Signed, Dorothy Spark, MD  07/12/2014 5:13 PM    [facility] [place], [place], [place]  [address] Phone: [phone]; Fax: [phone]

## 2014-07-22 ENCOUNTER — Ambulatory Visit (HOSPITAL_COMMUNITY): Payer: Medicare Other | Attending: Cardiology | Admitting: Radiology

## 2014-07-22 DIAGNOSIS — R42 Dizziness and giddiness: Secondary | ICD-10-CM | POA: Insufficient documentation

## 2014-07-22 DIAGNOSIS — R0609 Other forms of dyspnea: Secondary | ICD-10-CM | POA: Diagnosis not present

## 2014-07-22 DIAGNOSIS — R079 Chest pain, unspecified: Secondary | ICD-10-CM | POA: Diagnosis not present

## 2014-07-22 DIAGNOSIS — I10 Essential (primary) hypertension: Secondary | ICD-10-CM | POA: Insufficient documentation

## 2014-07-22 DIAGNOSIS — E782 Mixed hyperlipidemia: Secondary | ICD-10-CM

## 2014-07-22 DIAGNOSIS — I951 Orthostatic hypotension: Secondary | ICD-10-CM

## 2014-07-22 MED ORDER — TECHNETIUM TC 99M SESTAMIBI GENERIC - CARDIOLITE
11.0000 | Freq: Once | INTRAVENOUS | Status: AC | PRN
  Administered 2014-07-22: 11 via INTRAVENOUS

## 2014-07-22 MED ORDER — REGADENOSON 0.4 MG/5ML IV SOLN
0.4000 mg | Freq: Once | INTRAVENOUS | Status: AC
  Administered 2014-07-22: 0.4 mg via INTRAVENOUS

## 2014-07-22 MED ORDER — TECHNETIUM TC 99M SESTAMIBI GENERIC - CARDIOLITE
33.0000 | Freq: Once | INTRAVENOUS | Status: AC | PRN
  Administered 2014-07-22: 33 via INTRAVENOUS

## 2014-07-22 NOTE — Progress Notes (Signed)
[facility]UCLE[facility] [address] [place], [place] [address] [phone]    Cardiology Nuclear Med Study  Troy Combs is a 81 y.o. male     MRN : [id number]     DOB: [birth year]  Procedure Date: 07/22/2014  Nuclear Med Background Indication for Stress Test:  Evaluation for Ischemia History:  Previous Nuclear Study 8/10 neg-low risk,EF:65%,MI Cardiac Risk Factors: Hypertension  Symptoms:  Chest Pain, Dizziness and DOE   Nuclear Pre-Procedure Caffeine/Decaff Intake:  None NPO After: 7:00pm   Lungs:  clear O2 Sat: 92% on room air. IV 0.9% NS with Angio Cath:  22g  IV Site: R Hand  IV Started by:  Ileene Hutchinson, EMT-P  Chest Size (in):  36 Cup Size: n/a  Height: 5\' 9"  (1.753 m)  Weight:  172 lb (78.019 kg)  BMI:  Body mass index is 25.39 kg/(m^2). Tech Comments:  n/a    Nuclear Med Study 1 or 2 day study: 1 day  Stress Test Type:  Treadmill/Lexiscan  Reading MD: n/a  Order Authorizing Provider:  Ena Dawley, MD  Resting Radionuclide: Technetium 10m Sestamibi  Resting Radionuclide Dose: 11.0 mCi   Stress Radionuclide:  Technetium 27m Sestamibi  Stress Radionuclide Dose: 33.0 mCi           Stress Protocol Rest HR: 65 Stress HR: 98  Rest BP: 152/100 Stress BP: 131/50  Exercise Time (min): n/a METS: n/a   Predicted Max HR: 140 bpm % Max HR: 70 bpm Rate Pressure Product: 16562   Dose of Adenosine (mg):  n/a Dose of Lexiscan: 0.4 mg  Dose of Atropine (mg): n/a Dose of Dobutamine: n/a mcg/kg/min (at max HR)  Stress Test Technologist: Glade Lloyd, BS-ES  Nuclear Technologist:  Earl Many, CNMT     Rest Procedure:  Myocardial perfusion imaging was performed at rest 45 minutes following the intravenous administration of Technetium 69m Sestamibi. Rest ECG:SR 65 bpm    Stress Procedure:  The patient received IV Lexiscan 0.4 mg over 15-seconds with concurrent low level exercise and then Technetium 51m Sestamibi was injected at 30-seconds  while the patient continued walking one more minute.  Quantitative spect images were obtained after a 45-minute delay.  Attempted to walk patient on Bruce protocol but patient complained of knee pain.  Had to switch to low level Lexiscan.  No symptoms during the infusion.  Stress ECG: No significant change from baseline ECG  QPS Raw Data Images: Soft tissue (diaphragm) underlies the heart   Stress Images:  Minimal thinning in the inferoapical wall.    Otherwise normal perfusion .  Rest Images:  Normal perfusion.   Subtraction (SDS):  No evidence of ischemia. Transient Ischemic Dilatation (Normal <1.22):  0.93 Lung/Heart Ratio (Normal <0.45):  0.35  Quantitative Gated Spect Images QGS EDV:  73 ml QGS ESV:  25 ml  Impression Exercise Capacity:  Lexiscan with low level exercise. BP Response:  Normal blood pressure response. Clinical Symptoms:  No chest pain. ECG Impression:  No significant ST segment change suggestive of ischemia. Comparison with Prior Nuclear Study:Previous report was low risk scan   Overall Impression: Probable normal perfusion and minimal apical thnning.  No evidence for significant ischemia or scar.  Overall low risk scan    LV Ejection Fraction: 66%.  LV Wall Motion:  NL LV Function; NL Wall Motion   Dorris Carnes

## 2014-08-10 ENCOUNTER — Other Ambulatory Visit (INDEPENDENT_AMBULATORY_CARE_PROVIDER_SITE_OTHER): Payer: Medicare Other

## 2014-08-10 ENCOUNTER — Encounter: Payer: Self-pay | Admitting: Internal Medicine

## 2014-08-10 ENCOUNTER — Ambulatory Visit (INDEPENDENT_AMBULATORY_CARE_PROVIDER_SITE_OTHER): Payer: Medicare Other | Admitting: Internal Medicine

## 2014-08-10 ENCOUNTER — Ambulatory Visit (INDEPENDENT_AMBULATORY_CARE_PROVIDER_SITE_OTHER)
Admission: RE | Admit: 2014-08-10 | Discharge: 2014-08-10 | Disposition: A | Discharge status: Home / Self Care | Payer: Medicare Other | Source: Ambulatory Visit | Attending: Internal Medicine | Admitting: Internal Medicine

## 2014-08-10 VITALS — BP 124/80 | HR 80 | Temp 97.9°F | Resp 18 | Ht 69.0 in | Wt 175.1 lb

## 2014-08-10 DIAGNOSIS — I10 Essential (primary) hypertension: Secondary | ICD-10-CM

## 2014-08-10 DIAGNOSIS — R42 Dizziness and giddiness: Secondary | ICD-10-CM

## 2014-08-10 DIAGNOSIS — R0609 Other forms of dyspnea: Secondary | ICD-10-CM

## 2014-08-10 DIAGNOSIS — R0600 Dyspnea, unspecified: Secondary | ICD-10-CM | POA: Insufficient documentation

## 2014-08-10 LAB — CBC WITH DIFFERENTIAL/PLATELET
Basophils Absolute: 0 10*3/uL (ref 0.0–0.1)
Basophils Relative: 0.3 % (ref 0.0–3.0)
Eosinophils Absolute: 0.2 10*3/uL (ref 0.0–0.7)
Eosinophils Relative: 2.2 % (ref 0.0–5.0)
HCT: 44.6 % (ref 39.0–52.0)
Hemoglobin: 15.4 g/dL (ref 13.0–17.0)
Lymphocytes Relative: 15.4 % (ref 12.0–46.0)
Lymphs Abs: 1.5 10*3/uL (ref 0.7–4.0)
MCHC: 34.5 g/dL (ref 30.0–36.0)
MCV: 86.5 fl (ref 78.0–100.0)
Monocytes Absolute: 0.7 10*3/uL (ref 0.1–1.0)
Monocytes Relative: 7.3 % (ref 3.0–12.0)
Neutro Abs: 7.4 10*3/uL (ref 1.4–7.7)
Neutrophils Relative %: 74.8 % (ref 43.0–77.0)
Platelets: 179 10*3/uL (ref 150.0–400.0)
RBC: 5.15 Mil/uL (ref 4.22–5.81)
RDW: 13.9 % (ref 11.5–15.5)
WBC: 9.9 10*3/uL (ref 4.0–10.5)

## 2014-08-10 LAB — TSH: TSH: 1.17 u[IU]/mL (ref 0.35–4.50)

## 2014-08-10 LAB — BASIC METABOLIC PANEL
BUN: 23 mg/dL (ref 6–23)
CO2: 25 mEq/L (ref 19–32)
Calcium: 9.8 mg/dL (ref 8.4–10.5)
Chloride: 106 mEq/L (ref 96–112)
Creatinine, Ser: 0.94 mg/dL (ref 0.40–1.50)
GFR: 81.92 mL/min (ref >60.00)
Glucose, Bld: 133 mg/dL — ABNORMAL HIGH (ref 70–99)
Potassium: 3.8 mEq/L (ref 3.5–5.1)
Sodium: 139 mEq/L (ref 135–145)

## 2014-08-10 LAB — HEPATIC FUNCTION PANEL
ALT: 19 U/L (ref 0–53)
AST: 18 U/L (ref 0–37)
Albumin: 4.4 g/dL (ref 3.5–5.2)
Alkaline Phosphatase: 54 U/L (ref 39–117)
Bilirubin, Direct: 0.1 mg/dL (ref 0.0–0.3)
Total Bilirubin: 0.7 mg/dL (ref 0.2–1.2)
Total Protein: 7 g/dL (ref 6.0–8.3)

## 2014-08-10 MED ORDER — ALBUTEROL SULFATE HFA 108 (90 BASE) MCG/ACT IN AERS: 2.0000 | INHALATION_SPRAY | Freq: Four times a day (QID) | RESPIRATORY_TRACT | Status: DC | PRN

## 2014-08-10 NOTE — Assessment & Plan Note (Signed)
Has hx of orthostatic hypotension, but this hx with DOE as well, for eval as detailed

## 2014-08-10 NOTE — Progress Notes (Signed)
Pre visit review using our clinic review tool, if applicable. No additional management support is needed unless otherwise documented below in the visit note. 

## 2014-08-10 NOTE — Patient Instructions (Addendum)
Please take all new medication as prescribed - the inhaler as needed  Please continue all other medications as before, and refills have been done if requested.  Please have the pharmacy call with any other refills you may need.  Please keep your appointments with your specialists as you may have planned  You will be contacted regarding the referral for: Echocardiogram, Event monitor  Please go to the XRAY Department in the Basement (go straight as you get off the elevator) for the x-ray testing  Please go to the LAB in the Basement (turn left off the elevator) for the tests to be done today  You will be contacted by phone if any changes need to be made immediately.  Otherwise, you will receive a letter about your results with an explanation, but please check with MyChart first.  Please remember to sign up for MyChart if you have not done so, as this will be important to you in the future with finding out test results, communicating by private email, and scheduling acute appointments online when needed.

## 2014-08-10 NOTE — Assessment & Plan Note (Signed)
Etiology unclear, for 2d echo r/o AS, 2 wk event monitor - r/o bradycardia on BB, also for cxr, labs as ordered, and consider PFT's, pulm referral  Note:  Total time for pt hx, exam, review of record with pt in the room, determination of diagnoses and plan for further eval and tx is > 40 min, with over 50% spent in coordination and counseling of patient

## 2014-08-10 NOTE — Progress Notes (Signed)
Subjective:    Patient ID: Troy Combs, male    DOB: 1931/07/25, 81 y.o.   MRN: [id number]  HPI  Here to f/u with c/o intermittent dizzy episodes near daily for several wks to different degrees, occurred again yesterday worse than usual mod tosevere - - c/o episode sob with walking room to room and moving some towels yesterday, felt dizzy, had to sit down to avoid passing out.   Overall o/w felt good  -  Pt denies fever, wt loss, night sweats, loss of appetite, or other constitutional symptoms  Pt denies chest pain, wheezing, orthopnea, PND, increased LE swelling, palpitations, or syncope.  Denies worsening reflux, abd pain, dysphagia, n/v, bowel change or blood.except for small heartburn a few days ago after spicy food. Overall episode lasted 3-4 min overall.  Overall good compliance with treatment, and good medicine tolerability., no recent changes.  No blood.  normal stress test mar 2016.  Last ehco on chart April 2012: Study Conclusions  - Left ventricle: The cavity size was normal. There was mild focal basal hypertrophy of the septum. Systolic function was normal. The estimated ejection fraction was in the range of 55% to 65%. Wall motion was normal; there were no regional wall motion abnormalities. Doppler parameters are consistent with abnormal left ventricular relaxation (grade 1 diastolic dysfunction). - Aortic valve: Trivial regurgitation. - Mitral valve: Calcified annulus. - Left atrium: The atrium was mildly dilated. - Atrial septum: There was redundancy of the septum, with borderline criteria for aneurysm. - Pulmonary arteries: Systolic pressure was mildly increased. - Pericardium, extracardiac: A trivial pericardial effusion was identified. Past Medical History  Diagnosis Date  . Old myocardial infarction   . Unspecified essential hypertension   . Personal history of other diseases of digestive system   . Personal history of malignant melanoma of skin     . Dysthymic disorder   . Anemia, unspecified   . Thoracic or lumbosacral neuritis or radiculitis, unspecified   . Impotence of organic origin   . Malignant melanoma of skin of upper limb, including shoulder   . Bladder neck obstruction   . Coronary atherosclerosis of unspecified type of vessel, native or graft   . Impaired glucose tolerance 12/02/2012   Past Surgical History  Procedure Laterality Date  . Arthrodesis  07/06/2002    of left long finger distal interphalangeal joint with Kirschner wire fixation X 3  . Coronary artery bypass graft  10/17/2000    [name]. [name], MD  . Atrial ablation surgery  05/04/2001    Dr. [name]  . Coronary angioplasty  1993  . Hernia repair      BILATERAL  . Cholecystectomy    . Exploratory laparotomy      AFTER LIVER TRAUMA AND HAND SURGERY  . Cardiac catheterization  09/05/2008    Revealing 4 of 4 patent grafts with native multivessel coronary artery disease, EF  of 60% without regional wall motion abnormalities.    reports that he quit smoking about 58 years ago. He has never used smokeless tobacco. He reports that he drinks alcohol. He reports that he does not use illicit drugs. family history includes Heart disease in his father. There is no history of Ataxia, Chorea, Dementia, Mental retardation, Migraines, Multiple sclerosis, Neurofibromatosis, Neuropathy, Parkinsonism, Seizures, or Stroke. Allergies  Allergen Reactions  . Iohexol Other (See Comments)     Code: HIVES, Desc: PATENT STATES HE IS ALLERGIC TO IV DYE 09/14/08/RM, Onset Date: [id number]   . Ciprofloxacin Other (  See Comments)    Patient unsure just know he has a reaction to med   Current Outpatient Prescriptions on File Prior to Visit  Medication Sig Dispense Refill  . aspirin 81 MG tablet Take 81 mg by mouth daily.    . CRESTOR 10 MG tablet TAKE 1 TABLET DAILY 90 tablet 2  . finasteride (PROSCAR) 5 MG tablet TAKE 1 TABLET DAILY 90 tablet 0  . metoprolol succinate  (TOPROL XL) 25 MG 24 hr tablet Take 1 tablet (25 mg total) by mouth daily. 90 tablet 3  . nitroGLYCERIN (NITROSTAT) 0.4 MG SL tablet Place 1 tablet (0.4 mg total) under the tongue as directed. 30 tablet 3  . nortriptyline (PAMELOR) 25 MG capsule Take 1 capsule (25 mg total) by mouth at bedtime. 90 capsule 1  . omeprazole (PRILOSEC) 20 MG capsule Take 20 mg by mouth daily.      . sertraline (ZOLOFT) 50 MG tablet TAKE 1 TABLET DAILY 90 tablet 2  . tamsulosin (FLOMAX) 0.4 MG CAPS capsule Take 1 capsule (0.4 mg total) by mouth daily. 90 capsule 3   No current facility-administered medications on file prior to visit.   Review of Systems  Constitutional: Negative for unusual diaphoresis or night sweats HENT: Negative for ringing in ear or discharge Eyes: Negative for double vision or worsening visual disturbance.  Respiratory: Negative for choking and stridor.   Gastrointestinal: Negative for vomiting or other signifcant bowel change Genitourinary: Negative for hematuria or change in urine volume.  Musculoskeletal: Negative for other MSK pain or swelling Skin: Negative for color change and worsening wound.  Neurological: Negative for tremors and numbness other than noted  Psychiatric/Behavioral: Negative for decreased concentration or agitation other than above       Objective:   Physical Exam BP 124/80 mmHg  Pulse 80  Temp(Src) 97.9 F (36.6 C) (Oral)  Resp 18  Ht 5\' 9"  (1.753 m)  Wt 175 lb 1.3 oz (79.416 kg)  BMI 25.84 kg/m2  SpO2 93% VS noted,  Constitutional: Pt appears in no significant distress HENT: Head: NCAT.  Right Ear: External ear normal.  Left Ear: External ear normal.  Eyes: . Pupils are equal, round, and reactive to light. Conjunctivae and EOM are normal Neck: Normal range of motion. Neck supple.  Cardiovascular: Normal rate and regular rhythm.   Pulmonary/Chest: Effort normal and breath sounds without rales or wheezing.  Abd:  Soft, NT, ND, + BS Neurological: Pt  is alert. Not confused , motor grossly intact Skin: Skin is warm. No rash, no LE edema Psychiatric: Pt behavior is normal. No agitation.      Assessment & Plan:

## 2014-08-10 NOTE — Assessment & Plan Note (Signed)
stable overall by history and exam, recent data reviewed with pt, and pt to continue medical treatment as before,  to f/u any worsening symptoms or concerns BP Readings from Last 3 Encounters:  08/10/14 124/80  07/22/14 152/100  05/19/14 142/72

## 2014-08-16 ENCOUNTER — Ambulatory Visit (HOSPITAL_COMMUNITY): Payer: Medicare Other | Attending: Internal Medicine | Admitting: Cardiology

## 2014-08-16 DIAGNOSIS — R0600 Dyspnea, unspecified: Secondary | ICD-10-CM

## 2014-08-16 DIAGNOSIS — R55 Syncope and collapse: Secondary | ICD-10-CM | POA: Diagnosis present

## 2014-08-16 DIAGNOSIS — R42 Dizziness and giddiness: Secondary | ICD-10-CM | POA: Insufficient documentation

## 2014-08-16 DIAGNOSIS — R0609 Other forms of dyspnea: Secondary | ICD-10-CM | POA: Diagnosis not present

## 2014-08-16 NOTE — Progress Notes (Signed)
Echo performed. 

## 2014-08-18 ENCOUNTER — Encounter (INDEPENDENT_AMBULATORY_CARE_PROVIDER_SITE_OTHER): Payer: Medicare Other

## 2014-08-18 ENCOUNTER — Encounter: Payer: Self-pay | Admitting: *Deleted

## 2014-08-18 DIAGNOSIS — R0600 Dyspnea, unspecified: Secondary | ICD-10-CM

## 2014-08-18 DIAGNOSIS — R42 Dizziness and giddiness: Secondary | ICD-10-CM | POA: Diagnosis not present

## 2014-08-18 DIAGNOSIS — R0609 Other forms of dyspnea: Principal | ICD-10-CM

## 2014-08-18 NOTE — Progress Notes (Signed)
Patient ID: Troy Combs, male   DOB: [birth year], 81 y.o.   MRN: [id number] Life Watch Monitor applied for 14 days

## 2014-08-25 ENCOUNTER — Encounter: Payer: Self-pay | Admitting: Cardiology

## 2014-08-25 ENCOUNTER — Telehealth: Payer: Self-pay | Admitting: *Deleted

## 2014-08-25 ENCOUNTER — Ambulatory Visit (INDEPENDENT_AMBULATORY_CARE_PROVIDER_SITE_OTHER): Payer: Medicare Other | Admitting: Cardiology

## 2014-08-25 VITALS — BP 110/62 | HR 66 | Ht 69.0 in | Wt 175.8 lb

## 2014-08-25 DIAGNOSIS — R42 Dizziness and giddiness: Secondary | ICD-10-CM

## 2014-08-25 DIAGNOSIS — E785 Hyperlipidemia, unspecified: Secondary | ICD-10-CM | POA: Diagnosis not present

## 2014-08-25 DIAGNOSIS — I951 Orthostatic hypotension: Secondary | ICD-10-CM | POA: Diagnosis not present

## 2014-08-25 DIAGNOSIS — I4589 Other specified conduction disorders: Secondary | ICD-10-CM | POA: Diagnosis not present

## 2014-08-25 NOTE — Progress Notes (Signed)
Patient ID: Troy Combs, male   DOB: May 30, 1931, 81 y.o.   MRN: [id number]      Cardiology Office Note   Date:  08/25/2014   ID:  Troy Combs, DOB 1932/02/23, MRN [id number]  PCP:  [name], MD  Cardiologist:  Former patient of Dr. 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 The last cath in 5/10 with patent L-LAD, S-RCA, S-OM1/OM2, EF 60%, atrial flutter, known orthostatic hypotension.  His last echo in 4/12: Ef 55-65%, grade 1 diast dysfxn, mild focal basal hypertrophy of the septum, mild LAE. Last myoview 8/10: apical thinning with mild apical ischemia, EF 65% (low risk).  In the past his Toprol XL was increased to 100 mg PO daily for symptomatic PVCs. Recently decreased to 50 mg po daily for fatigue and dizziness.  He comes today and denies any chest pain, however he exercises on almost daily basis and has noticed that he gets profoundly more SOB on exertion and also feels dizzy.  He has known orthostatic hypotension, he was advised to increase fluid and salt intake in the past but he hasn't been doing it. He doesn't like to drink water and drinks a lot coffee. No syncope. One one occasion he noticed short episode of HR 150 BPM but no presyncope of syncope.   08/25/14 - normal stress test, however chronotropic incompetence and switched to lexiscan,  normal echo, continues to have the same symptoms of dizziness about 1 minute after he starts walking and exertional dizziness.  Past Medical History  Diagnosis Date  . Old myocardial infarction   . Unspecified essential hypertension   . Personal history of other diseases of digestive system   . Personal history of malignant melanoma of skin   . Dysthymic disorder   . Anemia, unspecified    . Thoracic or lumbosacral neuritis or radiculitis, unspecified   . Impotence of organic origin   . Malignant melanoma of skin of upper limb, including shoulder   . Bladder neck obstruction   . Coronary atherosclerosis of unspecified type of vessel, native or graft   . Impaired glucose tolerance 12/02/2012    Past Surgical History  Procedure Laterality Date  . Arthrodesis  07/06/2002    of left long finger distal interphalangeal joint with Kirschner wire fixation X 3  . Coronary artery bypass graft  10/17/2000    [name]. [name], MD  . Atrial ablation surgery  05/04/2001    Dr. [name]  . Coronary angioplasty  1993  . Hernia repair      BILATERAL  . Cholecystectomy    . Exploratory laparotomy      AFTER LIVER TRAUMA AND HAND SURGERY  . Cardiac catheterization  09/05/2008    Revealing 4 of 4 patent grafts with native multivessel coronary artery disease, EF  of 60% without regional wall motion abnormalities.     Current Outpatient Prescriptions  Medication Sig Dispense Refill  . albuterol (PROVENTIL HFA;VENTOLIN HFA) 108 (90 BASE) MCG/ACT inhaler Inhale 2 puffs into the lungs every 6 (six) hours as needed for wheezing or shortness of breath. 1 Inhaler 11  . aspirin 81 MG tablet Take 81 mg by mouth daily.    . CRESTOR 10  MG tablet TAKE 1 TABLET DAILY 90 tablet 2  . finasteride (PROSCAR) 5 MG tablet TAKE 1 TABLET DAILY 90 tablet 0  . metoprolol succinate (TOPROL XL) 25 MG 24 hr tablet Take 1 tablet (25 mg total) by mouth daily. 90 tablet 3  . nitroGLYCERIN (NITROSTAT) 0.4 MG SL tablet Place 1 tablet (0.4 mg total) under the tongue as directed. 30 tablet 3  . nortriptyline (PAMELOR) 25 MG capsule Take 1 capsule (25 mg total) by mouth at bedtime. 90 capsule 1  . omeprazole (PRILOSEC) 20 MG capsule Take 20 mg by mouth daily.      . sertraline (ZOLOFT) 50 MG tablet TAKE 1 TABLET DAILY 90 tablet 2  . tamsulosin (FLOMAX) 0.4 MG CAPS capsule Take 1 capsule (0.4 mg total) by mouth  daily. 90 capsule 3   No current facility-administered medications for this visit.    Allergies:   Iohexol and Ciprofloxacin    Social History:  The patient  reports that he quit smoking about 58 years ago. He has never used smokeless tobacco. He reports that he drinks alcohol. He reports that he does not use illicit drugs.   Family History:  The patient's family history includes Heart disease in his father. There is no history of Ataxia, Chorea, Dementia, Mental retardation, Migraines, Multiple sclerosis, Neurofibromatosis, Neuropathy, Parkinsonism, Seizures, or Stroke.    ROS:  Please see the history of present illness. All other systems are reviewed and negative.    PHYSICAL EXAM: VS:  BP 110/62 mmHg  Pulse 66  Ht 5\' 9"  (1.753 m)  Wt 175 lb 12.8 oz (79.742 kg)  BMI 25.95 kg/m2 , BMI Body mass index is 25.95 kg/(m^2). GEN: Well nourished, well developed, in no acute distress HEENT: normal Neck: no JVD, carotid bruits, or masses Cardiac: RRR; no murmurs, rubs, or gallops,no edema  Respiratory:  clear to auscultation bilaterally, normal work of breathing GI: soft, nontender, nondistended, + BS MS: no deformity or atrophy Skin: warm and dry, no rash Neuro:  Strength and sensation are intact Psych: euthymic mood, full affect   EKG:  EKG is ordered today. The ekg ordered today demonstrates SR, normal ECG, HR 63 BPM   Recent Labs: 08/10/2014: ALT 19; BUN 23; Creatinine 0.94; Hemoglobin 15.4; Platelets 179.0; Potassium 3.8; Sodium 139; TSH 1.17    Lipid Panel    Component Value Date/Time   CHOL 121 01/20/2014 1538   TRIG 188.0* 01/20/2014 1538   TRIG 83 03/18/2006 0839   HDL 39.20 01/20/2014 1538   CHOLHDL 3 01/20/2014 1538   CHOLHDL 2.9 CALC 03/18/2006 0839   VLDL 37.6 01/20/2014 1538   LDLCALC 44 01/20/2014 1538   Wt Readings from Last 3 Encounters:  08/25/14 175 lb 12.8 oz (79.742 kg)  08/10/14 175 lb 1.3 oz (79.416 kg)  07/22/14 172 lb (78.019 kg)      Echocardiogram: 08/16/2010 Study Conclusions  - Left ventricle: The cavity size was normal. There was mild focal basal hypertrophy of the septum. Systolic function was normal. The estimated ejection fraction was in the range of 55% to 65%. Wall motion was normal; there were no regional wall motion abnormalities. Doppler parameters are consistent with abnormal left ventricular relaxation (grade 1 diastolic dysfunction). - Aortic valve: Trivial regurgitation. - Mitral valve: Calcified annulus. - Left atrium: The atrium was mildly dilated. - Atrial septum: There was redundancy of the septum, with borderline criteria for aneurysm. - Pulmonary arteries: Systolic pressure was mildly increased. - Pericardium, extracardiac: A trivial pericardial effusion was  identified.  Echo: 08/16/2014 Study Conclusions  - Left ventricle: The cavity size was normal. Wall thickness was increased in a pattern of mild LVH. There was mild focal basal hypertrophy of the septum. Systolic function was vigorous. The estimated ejection fraction was in the range of 65% to 70%. Wall motion was normal; there were no regional wall motion abnormalities. Doppler parameters are consistent with abnormal left ventricular relaxation (grade 1 diastolic dysfunction). - Mitral valve: Calcified annulus. Mild prolapse, involving the anterior leaflet and the posterior leaflet.  Impressions: - Vigorous LV function; grade 1 diastolic dysfunction; mild bileaflet MVP; trace MR and TR.  Lexiscan stress test: Impression Exercise Capacity: Lexiscan with low level exercise. BP Response: Normal blood pressure response. Clinical Symptoms: No chest pain. ECG Impression: No significant ST segment change suggestive of ischemia. Comparison with Prior Nuclear Study:Previous report was low risk scan  Overall Impression: Probable normal perfusion and minimal apical thnning. No evidence for significant  ischemia or scar. Overall low risk scan  LV Ejection Fraction: 66%. LV Wall Motion: NL LV Function; NL Wall Motion     ASSESSMENT AND PLAN:  Very pleasant, younger appearing 81 year old male  1. CAD, s/p CABG, no ischemia on stress test.   - continue ASA, crestor - discontinue toprol XL  2. Exertional dizziness I think this is a combination of Orthostatic hypotension and chronotropic incompetence - we will dicontinue Toprol XL - the patient is advised to increase fluid and electrolytes intake (Gatorade, Nuun electrolytes pills) - fluid before exercise   He continues to wear e cardio monitor, we will review results once completed.  3. HLP - at goal on crestor 10 mg po daily  Follow up in 3 months.   Signed, Dorothy Spark, MD  08/25/2014 10:02 AM    [facility] [address], [place], [place]  [address] Phone: [phone]; Fax: [phone]

## 2014-08-25 NOTE — Telephone Encounter (Signed)
Notified the pt and wife of 14 day event monitor per Dr [name] showing the pt has PVCs, no other arrhythmias and no therapy needed.  Pt and wife verbalized understanding and agrees with this plan.

## 2014-08-25 NOTE — Patient Instructions (Signed)
Medication Instructions:   STOP TAKING TOPROL XL NOW       Follow-Up:  Your physician recommends that you schedule a follow-up appointment in: [place]

## 2014-08-25 NOTE — Addendum Note (Signed)
Addended by: Nuala Alpha on: 08/25/2014 10:25 AM   Modules accepted: Orders, Medications

## 2014-08-30 ENCOUNTER — Encounter: Payer: Self-pay | Admitting: Cardiology

## 2014-08-30 ENCOUNTER — Telehealth: Payer: Self-pay | Admitting: *Deleted

## 2014-08-30 MED ORDER — AMLODIPINE BESYLATE 5 MG PO TABS: 5.0000 mg | ORAL_TABLET | Freq: Every day | ORAL | Status: DC

## 2014-08-30 NOTE — Telephone Encounter (Signed)
Non-urgent email message rec'd from patient, as follows:  Message     Been taking my blood pressure this morning because my head is pounding. All taken while sitting in recliner.    8am    179/106 HR 106    1030am 157/98  HR 73    1115am 187/105 HR 95    1125am 162/103 HR 78    Nothing abnormal eaten (Salad last evening, cold cereal this morning)        Troy Combs    Attempted to call patient twice, however message rec'd stating, "Due to Network Difficulties Your Call Cannot Be Completed At This Time". Will inform Triage Lead so that call can be attempted again shortly.

## 2014-08-30 NOTE — Telephone Encounter (Signed)
Reviewed BP's with Dr Mare Ferrari who gave orders for pt to start Amlodipine 5 mg a day.  He uses [facility] and Rx will be sent in.  Pt will continue to monitor his BP and call back with further issues.  Information will be forwarded to Dr [name] for her review.

## 2014-08-30 NOTE — Telephone Encounter (Signed)
Spoke with pt who reports yesterday he noticed a real bad h/a that was pounding.  His BP was elevated. He reports the most it usually is is 140/80.  His Metoprolol was d/ced 4/28 d/t dizziness however pt states he is having dizziness with position changes regardless of being off Metoprolol.  He would like to restart his Metoprolol for BP control.  Aware I will review with MD and call him back.

## 2014-09-06 ENCOUNTER — Encounter: Payer: Self-pay | Admitting: Cardiology

## 2014-09-06 MED ORDER — CARVEDILOL 3.125 MG PO TABS: 3.1250 mg | ORAL_TABLET | Freq: Two times a day (BID) | ORAL | Status: DC

## 2014-09-06 NOTE — Telephone Encounter (Signed)
  Informed the pt that per Dr [name], according to the pts log of BP readings sent today, she would add carvedilol 3.125 mg po BID to his regimen, and have the pt update [place] about his BP readings and symptoms - for he has known orthostatic hypotension.  Confirmed the pharmacy of choice with the pt and phoned this in to pts pharmacy to [facility]. Pt verbalized understanding and agrees with this plan.   Also notified the pt that per Dr [name] the pts event monitor revealed PVCs, no arrhythmias, and no therapy necessary per Dr [name]. Pt verbalized understanding and agrees with this plan.

## 2014-09-09 ENCOUNTER — Encounter: Payer: Self-pay | Admitting: Cardiology

## 2014-09-15 ENCOUNTER — Telehealth: Payer: Self-pay | Admitting: Cardiology

## 2014-09-15 NOTE — Telephone Encounter (Signed)
New message       Pt c/o medication issue:  1. Name of Medication:  coreg  2. How are you currently taking this medication (dosage and times per day)?  1 tablet bid  3. Are you having a reaction (difficulty breathing--STAT)? no 4. What is your medication issue?  Pt started vomiting yesterday, he is very dizzy.  Could this be the medication?

## 2014-09-15 NOTE — Telephone Encounter (Signed)
Spoke with the pt and wife about pts complaints of being "swimmy headed" with positional changes.  Pt states any time he moves or turns his head, the room spins and he becomes very nauseated.  Pt describes this as "feeling like I'm on a boat out at sea."  Pt states "It feels like what I experienced back when I was in the [place] and became sea sick." Pt denies any cardiac complaints like cp, sob, palpitations, pre-syncopal, or syncopal episodes at this time.  Pt states his BP has remained WNL and does not drop when he changes positions.  Pt states his HR has remained WNL as well.  Pt was wondering if Coreg would cause his complaints.  Informed the pt that with speaking to our pharmacist, it sounds like he could possibly be experiencing vertigo.  Showed the pharmacist the pts current med list for possible suggestion for the pt to start taking meclizine for acute episode of vertigo.  Per our pharmacist, the pts current meds are safe to take with meclizine.  Advised the pt that he can get meclizine OTC from any local pharmacy and take this med exactly as directed while acute vertigo is happening.  Advised the pt to stop taking it once vertigo dissipates.  Also advised the pt to follow-up with his PCP for further assessment of vertigo or any inner ear issues.  Informed the pt that I will also route this message to Dr [name] for further review and any other recommendations and follow-up with the pt thereafter if necessary. Pt verbalized understanding, agrees with this plan, and gracious for all the assistance provided.

## 2014-09-15 NOTE — Telephone Encounter (Signed)
I agree with the suggestions, we should call the patient in a week to see if he has any improvements after he starts taking meclizine, thank you

## 2014-09-16 ENCOUNTER — Encounter: Payer: Self-pay | Admitting: Cardiology

## 2014-09-20 ENCOUNTER — Encounter: Payer: Self-pay | Admitting: Internal Medicine

## 2014-09-20 ENCOUNTER — Ambulatory Visit (INDEPENDENT_AMBULATORY_CARE_PROVIDER_SITE_OTHER): Payer: Medicare Other | Admitting: Internal Medicine

## 2014-09-20 VITALS — BP 142/90 | HR 88 | Temp 97.8°F | Wt 174.0 lb

## 2014-09-20 DIAGNOSIS — J309 Allergic rhinitis, unspecified: Secondary | ICD-10-CM | POA: Diagnosis not present

## 2014-09-20 DIAGNOSIS — I10 Essential (primary) hypertension: Secondary | ICD-10-CM | POA: Diagnosis not present

## 2014-09-20 DIAGNOSIS — R42 Dizziness and giddiness: Secondary | ICD-10-CM | POA: Diagnosis not present

## 2014-09-20 HISTORY — DX: Allergic rhinitis, unspecified: J30.9

## 2014-09-20 MED ORDER — TRIAMCINOLONE ACETONIDE 55 MCG/ACT NA AERO: 2.0000 | INHALATION_SPRAY | Freq: Every day | NASAL | Status: DC

## 2014-09-20 MED ORDER — CARVEDILOL 6.25 MG PO TABS: 6.2500 mg | ORAL_TABLET | Freq: Two times a day (BID) | ORAL | Status: DC

## 2014-09-20 MED ORDER — CETIRIZINE HCL 10 MG PO TABS: 10.0000 mg | ORAL_TABLET | Freq: Every day | ORAL | Status: DC

## 2014-09-20 NOTE — Progress Notes (Signed)
Subjective:    Patient ID: Troy Combs, male    DOB: Oct 02, 1931, 81 y.o.   MRN: [id number]  HPI  Here to f/u; overall doing ok,  Pt denies chest pain, increasing sob or doe, wheezing, orthopnea, PND, increased LE swelling, palpitations, dizziness or syncope.  Pt denies new neurological symptoms such as new headache, or facial or extremity weakness or numbness.  Pt denies polydipsia, polyuria, or low sugar episode.   Pt denies new neurological symptoms such as new headache, or facial or extremity weakness or numbness.   Pt states overall good compliance with meds, mostly trying to follow appropriate diet. BP has been mildly elevated recenlty.  Now Off metoprolol per Dr [name], later amlodipine which didn't seem effective.  Now on lower starting dose coreg which also does not seem as effective as the metoprolol, so he is wondering about going back to the metoprolol, though per pt he had limited HR to high of 84 with exertion that bothered him as well with the metoprolol  Also with ongoing recurring episode dizziness, with later vomiting and nausea , started last Friday, intermittent x 3 days, improved with meclizine but still feels like "I could get some nausea if I tried."  Family member with menier's, wondering about genetic component that he found suggested on the internet  Does have several wks ongoing nasal allergy symptoms with clearish congestion, itch and sneezing, without fever, pain, ST, cough, swelling or wheezing. Past Medical History  Diagnosis Date  . Old myocardial infarction   . Unspecified essential hypertension   . Personal history of other diseases of digestive system   . Personal history of malignant melanoma of skin   . Dysthymic disorder   . Anemia, unspecified   . Thoracic or lumbosacral neuritis or radiculitis, unspecified   . Impotence of organic origin   . Malignant melanoma of skin of upper limb, including shoulder   . Bladder neck obstruction   . Coronary  atherosclerosis of unspecified type of vessel, native or graft   . Impaired glucose tolerance 12/02/2012   Past Surgical History  Procedure Laterality Date  . Arthrodesis  07/06/2002    of left long finger distal interphalangeal joint with Kirschner wire fixation X 3  . Coronary artery bypass graft  10/17/2000    [name]. [name], MD  . Atrial ablation surgery  05/04/2001    Dr. [name]  . Coronary angioplasty  1993  . Hernia repair      BILATERAL  . Cholecystectomy    . Exploratory laparotomy      AFTER LIVER TRAUMA AND HAND SURGERY  . Cardiac catheterization  09/05/2008    Revealing 4 of 4 patent grafts with native multivessel coronary artery disease, EF  of 60% without regional wall motion abnormalities.    reports that he quit smoking about 59 years ago. He has never used smokeless tobacco. He reports that he drinks alcohol. He reports that he does not use illicit drugs. family history includes Heart disease in his father. There is no history of Ataxia, Chorea, Dementia, Mental retardation, Migraines, Multiple sclerosis, Neurofibromatosis, Neuropathy, Parkinsonism, Seizures, or Stroke. Allergies  Allergen Reactions  . Iohexol Other (See Comments)     Code: HIVES, Desc: PATENT STATES HE IS ALLERGIC TO IV DYE 09/14/08/RM, Onset Date: [id number]   . Ciprofloxacin Other (See Comments)    Patient unsure just know he has a reaction to med   Current Outpatient Prescriptions on File Prior to Visit  Medication Sig  Dispense Refill  . albuterol (PROVENTIL HFA;VENTOLIN HFA) 108 (90 BASE) MCG/ACT inhaler Inhale 2 puffs into the lungs every 6 (six) hours as needed for wheezing or shortness of breath. 1 Inhaler 11  . aspirin 81 MG tablet Take 81 mg by mouth daily.    . CRESTOR 10 MG tablet TAKE 1 TABLET DAILY 90 tablet 2  . finasteride (PROSCAR) 5 MG tablet TAKE 1 TABLET DAILY 90 tablet 0  . nitroGLYCERIN (NITROSTAT) 0.4 MG SL tablet Place 1 tablet (0.4 mg total) under the tongue as  directed. 30 tablet 3  . nortriptyline (PAMELOR) 25 MG capsule Take 1 capsule (25 mg total) by mouth at bedtime. 90 capsule 1  . omeprazole (PRILOSEC) 20 MG capsule Take 20 mg by mouth daily.      . sertraline (ZOLOFT) 50 MG tablet TAKE 1 TABLET DAILY 90 tablet 2  . tamsulosin (FLOMAX) 0.4 MG CAPS capsule Take 1 capsule (0.4 mg total) by mouth daily. 90 capsule 3  . amLODipine (NORVASC) 5 MG tablet Take 1 tablet (5 mg total) by mouth daily. (Patient not taking: Reported on 09/20/2014) 30 tablet 6   No current facility-administered medications on file prior to visit.      Review of Systems  Constitutional: Negative for unusual diaphoresis or night sweats HENT: Negative for ringing in ear or discharge Eyes: Negative for double vision or worsening visual disturbance.  Respiratory: Negative for choking and stridor.   Gastrointestinal: Negative for vomiting or other signifcant bowel change Genitourinary: Negative for hematuria or change in urine volume.  Musculoskeletal: Negative for other MSK pain or swelling Skin: Negative for color change and worsening wound.  Neurological: Negative for tremors and numbness other than noted  Psychiatric/Behavioral: Negative for decreased concentration or agitation other than above       Objective:   Physical Exam BP 142/90 mmHg  Pulse 88  Temp(Src) 97.8 F (36.6 C) (Oral)  Wt 174 lb 0.6 oz (78.944 kg)  SpO2 97% VS noted,  Constitutional: Pt appears in no significant distress HENT: Head: NCAT.  Right Ear: External ear normal.  Left Ear: External ear normal.  Eyes: . Pupils are equal, round, and reactive to light. Conjunctivae and EOM are normal Bilat tm's with mild erythema.  Max sinus areas non tender.  Pharynx with mild erythema, no exudate Neck: Normal range of motion. Neck supple.  Cardiovascular: Normal rate and regular rhythm.   Pulmonary/Chest: Effort normal and breath sounds without rales or wheezing.  Abd:  Soft, NT, ND, +  BS Neurological: Pt is alert. Not confused , motor grossly intact Skin: Skin is warm. No rash, no LE edema Psychiatric: Pt behavior is normal. No agitation.  BP Readings from Last 3 Encounters:  09/20/14 142/90  08/25/14 110/62  08/10/14 124/80       Assessment & Plan:

## 2014-09-20 NOTE — Assessment & Plan Note (Signed)
Mild to mod, for zyrtec, nasaocrt asd,  to f/u any worsening symptoms or concerns

## 2014-09-20 NOTE — Assessment & Plan Note (Signed)
Some improved, cont melcizine prn,  to f/u any worsening symptoms or concerns

## 2014-09-20 NOTE — Progress Notes (Signed)
Pre visit review using our clinic review tool, if applicable. No additional management support is needed unless otherwise documented below in the visit note. 

## 2014-09-20 NOTE — Assessment & Plan Note (Addendum)
stable overall by history and exam, recent data reviewed with pt, ok for increaed coreg to 6.25 bid, and pt to o/w continue medical treatment as before,  to f/u any worsening symptoms or concerns BP Readings from Last 3 Encounters:  09/20/14 142/90  08/25/14 110/62  08/10/14 124/80

## 2014-09-20 NOTE — Patient Instructions (Signed)
Please take all new medication as prescribed - the zyrtec and nasacort for allergies  OK to increase the coreg to 6.25 mg twice per day  You can also take Mucinex (or it's generic off brand) for congestion, and tylenol as needed for pain.  Please continue all other medications as before, including the meclizine for dizziness  Please have the pharmacy call with any other refills you may need.  Please keep your appointments with your specialists as you may have planned

## 2014-09-28 ENCOUNTER — Encounter: Payer: Self-pay | Admitting: Cardiology

## 2014-09-28 NOTE — Telephone Encounter (Signed)
Informed the pt through my chart that per Dr [name] she reviewed his recent emails with complaints of continuous dizziness with positional changes and she advises that she would discontinue the pts betablocker and see how he feels, and have him keep checking his BP after this and get back to [place] in 2-3 days.  Informed the pt through mychart that we will just hold his carvedilol in his med list and wait for his return feedback in 2 to 3 days to inform if holding this med helped or not.

## 2014-10-01 ENCOUNTER — Encounter: Payer: Self-pay | Admitting: Cardiology

## 2014-10-01 DIAGNOSIS — R42 Dizziness and giddiness: Secondary | ICD-10-CM

## 2014-10-05 MED ORDER — AMLODIPINE BESYLATE 10 MG PO TABS: 10.0000 mg | ORAL_TABLET | Freq: Every day | ORAL | Status: DC

## 2014-10-05 NOTE — Telephone Encounter (Signed)
  Called patient's spouse (DPR) with Dr. Francesca Oman note. Patient should increase amlodipine to 10 mg po daily and be referred to ENT for vestibular exercises training. Patient's spouse verbalized understanding.

## 2014-10-12 ENCOUNTER — Encounter: Payer: Self-pay | Admitting: Cardiology

## 2014-10-26 ENCOUNTER — Encounter: Payer: Self-pay | Admitting: Cardiology

## 2014-10-26 MED ORDER — CARVEDILOL 3.125 MG PO TABS: 3.1250 mg | ORAL_TABLET | Freq: Two times a day (BID) | ORAL | Status: DC

## 2014-10-26 NOTE — Telephone Encounter (Signed)
Yes, amlodipine can do that, please discontinue and try carvedilol 3.125 mg po BID instead.

## 2014-10-26 NOTE — Telephone Encounter (Signed)
Contacted the pt through mychart in regards to complaints of BP issues, dizziness, and orthostatic hypotension that Dr [name] reviewed his message and wants him to discontinue his amlodipine and try him on carvedilol 3.125 mg po bid.  Informed the pt through mychart that I d/c'ed his amlodipine in his med list and sent new script for carvedilol 3.125 mg po bid to listed pharmacy of choice.  Advised the pt through mychart that he should continue providing [place] with feedback on his symptoms via mychart.

## 2014-11-12 ENCOUNTER — Other Ambulatory Visit: Payer: Self-pay | Admitting: Internal Medicine

## 2014-11-14 ENCOUNTER — Other Ambulatory Visit: Payer: Self-pay

## 2014-11-14 MED ORDER — NORTRIPTYLINE HCL 25 MG PO CAPS: 25.0000 mg | ORAL_CAPSULE | Freq: Every day | ORAL | Status: DC

## 2014-11-22 ENCOUNTER — Other Ambulatory Visit: Payer: Self-pay

## 2014-11-22 MED ORDER — CARVEDILOL 3.125 MG PO TABS: 3.1250 mg | ORAL_TABLET | Freq: Two times a day (BID) | ORAL | Status: DC

## 2014-11-28 ENCOUNTER — Ambulatory Visit: Payer: Medicare Other | Admitting: Cardiology

## 2014-12-08 ENCOUNTER — Ambulatory Visit: Payer: Medicare Other | Admitting: Cardiology

## 2014-12-21 ENCOUNTER — Other Ambulatory Visit: Payer: Self-pay | Admitting: Internal Medicine

## 2015-01-10 ENCOUNTER — Emergency Department (HOSPITAL_COMMUNITY): Payer: Medicare Other

## 2015-01-10 ENCOUNTER — Emergency Department (HOSPITAL_COMMUNITY)
Admission: EM | Admit: 2015-01-10 | Discharge: 2015-01-10 | Disposition: A | Discharge status: Home / Self Care | Payer: Medicare Other | Attending: Emergency Medicine | Admitting: Emergency Medicine

## 2015-01-10 ENCOUNTER — Other Ambulatory Visit: Payer: Self-pay | Admitting: Internal Medicine

## 2015-01-10 ENCOUNTER — Encounter (HOSPITAL_COMMUNITY): Payer: Self-pay | Admitting: Emergency Medicine

## 2015-01-10 DIAGNOSIS — Z9889 Other specified postprocedural states: Secondary | ICD-10-CM | POA: Insufficient documentation

## 2015-01-10 DIAGNOSIS — Z85820 Personal history of malignant melanoma of skin: Secondary | ICD-10-CM | POA: Diagnosis not present

## 2015-01-10 DIAGNOSIS — R42 Dizziness and giddiness: Secondary | ICD-10-CM | POA: Diagnosis present

## 2015-01-10 DIAGNOSIS — I10 Essential (primary) hypertension: Secondary | ICD-10-CM | POA: Insufficient documentation

## 2015-01-10 DIAGNOSIS — Z862 Personal history of diseases of the blood and blood-forming organs and certain disorders involving the immune mechanism: Secondary | ICD-10-CM | POA: Diagnosis not present

## 2015-01-10 DIAGNOSIS — Z8659 Personal history of other mental and behavioral disorders: Secondary | ICD-10-CM | POA: Diagnosis not present

## 2015-01-10 DIAGNOSIS — Z8719 Personal history of other diseases of the digestive system: Secondary | ICD-10-CM | POA: Diagnosis not present

## 2015-01-10 DIAGNOSIS — Z87891 Personal history of nicotine dependence: Secondary | ICD-10-CM | POA: Insufficient documentation

## 2015-01-10 DIAGNOSIS — Z87448 Personal history of other diseases of urinary system: Secondary | ICD-10-CM | POA: Diagnosis not present

## 2015-01-10 DIAGNOSIS — I252 Old myocardial infarction: Secondary | ICD-10-CM | POA: Insufficient documentation

## 2015-01-10 DIAGNOSIS — Z79899 Other long term (current) drug therapy: Secondary | ICD-10-CM | POA: Diagnosis not present

## 2015-01-10 DIAGNOSIS — R51 Headache: Secondary | ICD-10-CM | POA: Insufficient documentation

## 2015-01-10 DIAGNOSIS — Z951 Presence of aortocoronary bypass graft: Secondary | ICD-10-CM | POA: Insufficient documentation

## 2015-01-10 DIAGNOSIS — I2510 Atherosclerotic heart disease of native coronary artery without angina pectoris: Secondary | ICD-10-CM | POA: Insufficient documentation

## 2015-01-10 DIAGNOSIS — Z7982 Long term (current) use of aspirin: Secondary | ICD-10-CM | POA: Diagnosis not present

## 2015-01-10 LAB — CBC WITH DIFFERENTIAL/PLATELET
Basophils Absolute: 0 10*3/uL (ref 0.0–0.1)
Basophils Relative: 0 % (ref 0–1)
Eosinophils Absolute: 0.2 10*3/uL (ref 0.0–0.7)
Eosinophils Relative: 2 % (ref 0–5)
HCT: 44.2 % (ref 39.0–52.0)
Hemoglobin: 15.5 g/dL (ref 13.0–17.0)
Lymphocytes Relative: 18 % (ref 12–46)
Lymphs Abs: 1.7 10*3/uL (ref 0.7–4.0)
MCH: 30.3 pg (ref 26.0–34.0)
MCHC: 35.1 g/dL (ref 30.0–36.0)
MCV: 86.5 fL (ref 78.0–100.0)
Monocytes Absolute: 0.5 10*3/uL (ref 0.1–1.0)
Monocytes Relative: 5 % (ref 3–12)
Neutro Abs: 6.9 10*3/uL (ref 1.7–7.7)
Neutrophils Relative %: 75 % (ref 43–77)
Platelets: 165 10*3/uL (ref 150–400)
RBC: 5.11 MIL/uL (ref 4.22–5.81)
RDW: 13.3 % (ref 11.5–15.5)
WBC: 9.3 10*3/uL (ref 4.0–10.5)

## 2015-01-10 LAB — BASIC METABOLIC PANEL
Anion gap: 7 (ref 5–15)
BUN: 26 mg/dL — ABNORMAL HIGH (ref 6–20)
CO2: 28 mmol/L (ref 22–32)
Calcium: 9.7 mg/dL (ref 8.9–10.3)
Chloride: 103 mmol/L (ref 101–111)
Creatinine, Ser: 1.03 mg/dL (ref 0.61–1.24)
GFR calc Af Amer: >60 mL/min (ref >60)
GFR calc non Af Amer: >60 mL/min (ref >60)
Glucose, Bld: 158 mg/dL — ABNORMAL HIGH (ref 65–99)
Potassium: 4.8 mmol/L (ref 3.5–5.1)
Sodium: 138 mmol/L (ref 135–145)

## 2015-01-10 MED ORDER — PROMETHAZINE HCL 12.5 MG PO TABS
12.5000 mg | ORAL_TABLET | Freq: Once | ORAL | Status: AC
  Administered 2015-01-10: 12.5 mg via ORAL
  Filled 2015-01-10: qty 1

## 2015-01-10 MED ORDER — MECLIZINE HCL 50 MG PO TABS: 25.0000 mg | ORAL_TABLET | Freq: Four times a day (QID) | ORAL | Status: DC | PRN

## 2015-01-10 MED ORDER — GABAPENTIN 600 MG PO TABS: 600.0000 mg | ORAL_TABLET | Freq: Once | ORAL | Status: DC

## 2015-01-10 MED ORDER — MECLIZINE HCL 25 MG PO TABS
12.5000 mg | ORAL_TABLET | Freq: Once | ORAL | Status: AC
  Administered 2015-01-10: 12.5 mg via ORAL
  Filled 2015-01-10: qty 1

## 2015-01-10 NOTE — ED Notes (Signed)
Pt sts dizziness with room spinning and N/V upon waking x 2 days with HA in back of head; pt sts some resolution at present of some dizziness

## 2015-01-10 NOTE — ED Provider Notes (Signed)
CSN: [id number]     Arrival date & time 01/10/15  1107 History   First MD Initiated Contact with Patient 01/10/15 1433     Chief Complaint  Patient presents with  . Headache  . Dizziness     (Consider location/radiation/quality/duration/timing/severity/associated sxs/prior Treatment) HPI   81 year old male with chief complaint of vertigo. Patient reports waking up yesterday and as he was getting out of bed he began to have a sensation that the room was spinning. This was exacerbated by movement. Patient felt very off balance to the point that he had hold onto nearby objects to keep his balance. Associated with nausea. Yesterday the symptoms improved over about the course of an hour. He had repeat a very similar symptoms this morning again as he was getting out of bed. Associated with left occipital headache. Dull in nature. This is been relatively constant. No acute visual changes. No acute numbness, tingling or focal loss of strength. No tinnitus or ear fullness. Spinning sensation has stopped, but HA has persisted. Has not tried anything for symptoms.   Past Medical History  Diagnosis Date  . Old myocardial infarction   . Unspecified essential hypertension   . Personal history of other diseases of digestive system   . Personal history of malignant melanoma of skin   . Dysthymic disorder   . Anemia, unspecified   . Thoracic or lumbosacral neuritis or radiculitis, unspecified   . Impotence of organic origin   . Malignant melanoma of skin of upper limb, including shoulder   . Bladder neck obstruction   . Coronary atherosclerosis of unspecified type of vessel, native or graft   . Impaired glucose tolerance 12/02/2012  . Allergic rhinitis 09/20/2014   Past Surgical History  Procedure Laterality Date  . Arthrodesis  07/06/2002    of left long finger distal interphalangeal joint with Kirschner wire fixation X 3  . Coronary artery bypass graft  10/17/2000    [name]. [name], MD  .  Atrial ablation surgery  05/04/2001    Dr. [name]  . Coronary angioplasty  1993  . Hernia repair      BILATERAL  . Cholecystectomy    . Exploratory laparotomy      AFTER LIVER TRAUMA AND HAND SURGERY  . Cardiac catheterization  09/05/2008    Revealing 4 of 4 patent grafts with native multivessel coronary artery disease, EF  of 60% without regional wall motion abnormalities.   Family History  Problem Relation Age of Onset  . Heart disease Father   . Ataxia Neg Hx   . Chorea Neg Hx   . Dementia Neg Hx   . Mental retardation Neg Hx   . Migraines Neg Hx   . Multiple sclerosis Neg Hx   . Neurofibromatosis Neg Hx   . Neuropathy Neg Hx   . Parkinsonism Neg Hx   . Seizures Neg Hx   . Stroke Neg Hx    Social History  Substance Use Topics  . Smoking status: Former Smoker    Quit date: 09/21/1955  . Smokeless tobacco: Never Used  . Alcohol Use: Yes     Comment: one or two ounces a week    Review of Systems All systems reviewed and negative, other than as noted in HPI.    Allergies  Iohexol and Ciprofloxacin  Home Medications   Prior to Admission medications   Medication Sig Start Date End Date Taking? Authorizing Provider  albuterol (PROVENTIL HFA;VENTOLIN HFA) 108 (90 BASE) MCG/ACT inhaler Inhale 2  puffs into the lungs every 6 (six) hours as needed for wheezing or shortness of breath. 08/10/14  Yes Biagio Borg, MD  aspirin 81 MG tablet Take 81 mg by mouth daily.   Yes Historical Provider, MD  carvedilol (COREG) 3.125 MG tablet Take 1 tablet (3.125 mg total) by mouth 2 (two) times daily with a meal. 11/22/14  Yes Dorothy Spark, MD  cetirizine (ZYRTEC) 10 MG tablet Take 1 tablet (10 mg total) by mouth daily. 09/20/14  Yes Biagio Borg, MD  finasteride (PROSCAR) 5 MG tablet TAKE 1 TABLET DAILY 07/20/10  Yes Aleksei Plotnikov V, MD  nortriptyline (PAMELOR) 25 MG capsule Take 1 capsule (25 mg total) by mouth at bedtime. 11/14/14  Yes Biagio Borg, MD  omeprazole (PRILOSEC)  20 MG capsule Take 20 mg by mouth daily.     Yes Historical Provider, MD  rosuvastatin (CRESTOR) 10 MG tablet TAKE 1 TABLET DAILY 01/10/15  Yes Biagio Borg, MD  sertraline (ZOLOFT) 50 MG tablet TAKE 1 TABLET DAILY 04/05/14  Yes Biagio Borg, MD  tamsulosin (FLOMAX) 0.4 MG CAPS capsule TAKE 1 CAPSULE DAILY 12/21/14  Yes Biagio Borg, MD  nitroGLYCERIN (NITROSTAT) 0.4 MG SL tablet Place 1 tablet (0.4 mg total) under the tongue as directed. 02/25/13   Biagio Borg, MD  triamcinolone (NASACORT AQ) 55 MCG/ACT AERO nasal inhaler Place 2 sprays into the nose daily. Patient not taking: Reported on 01/10/2015 09/20/14   Biagio Borg, MD   BP 118/73 mmHg  Pulse 90  Temp(Src) 97.4 F (36.3 C) (Oral)  Resp 18  Ht 5\' 10"  (1.778 m)  Wt 180 lb (81.647 kg)  BMI 25.83 kg/m2  SpO2 95% Physical Exam  Constitutional: He is oriented to person, place, and time. He appears well-developed and well-nourished. No distress.  HENT:  Head: Normocephalic and atraumatic.  Eyes: Conjunctivae are normal. Right eye exhibits no discharge. Left eye exhibits no discharge.  Neck: Neck supple.  No nuchal rigidity  Cardiovascular: Normal rate, regular rhythm and normal heart sounds.  Exam reveals no gallop and no friction rub.   No murmur heard. Pulmonary/Chest: Effort normal and breath sounds normal. No respiratory distress.  Abdominal: Soft. He exhibits no distension. There is no tenderness.  Musculoskeletal: He exhibits no edema or tenderness.  Neurological: He is alert and oriented to person, place, and time. No cranial nerve deficit. He exhibits normal muscle tone. Coordination normal.  Speech clear. Content appropriate. Follows commands. Sensation intact to light touch. Good heel to shin and finger to nose b/l.  Skin: Skin is warm and dry.  Psychiatric: He has a normal mood and affect. His behavior is normal. Thought content normal.  Nursing note and vitals reviewed.   ED Course  Procedures (including critical care  time) Labs Review Labs Reviewed  BASIC METABOLIC PANEL - Abnormal; Notable for the following:    Glucose, Bld 158 (*)    BUN 26 (*)    All other components within normal limits  CBC WITH DIFFERENTIAL/PLATELET    Imaging Review No results found.   Mr Brain Wo Contrast  01/18/2015   CLINICAL DATA:  Continue [place] left posterior headache and vertigo, several months duration.  EXAM: MRI HEAD WITHOUT CONTRAST  TECHNIQUE: Multiplanar, multiecho pulse sequences of the brain and surrounding structures were obtained without intravenous contrast.  COMPARISON:  Head CT 01/10/2015.  MRI 10/21/2008  FINDINGS: Diffusion imaging does not show any acute or subacute infarction. There is T2 shine through  in the left parietal deep white matter adjacent to the posterior body of the left lateral ventricle. This does not appear to show true restricted diffusion on the ADC map.  There are chronic small-vessel ischemic changes affecting the pons. No focal cerebellar infarction. The cerebral hemispheres show generalized atrophy with patchy and confluent chronic small vessel ischemic changes throughout the cerebral hemispheric white matter. There are old small vessel insults affecting the thalami and basal ganglia. No cortical or large vessel territory infarction. No mass lesion, hemorrhage, hydrocephalus or extra-axial collection. No pituitary mass. No fluid in the sinuses, middle ears or mastoids. CP angle regions appear normal.  IMPRESSION: No acute or reversible finding. Extensive chronic small vessel ischemic changes throughout the brain as described above. These are somewhat progressive since the study of 2010.   Electronically Signed   By: Nelson Chimes M.D.   On: 01/18/2015 17:54  I have personally reviewed and evaluated these images and lab results as part of my medical decision-making.   EKG Interpretation   Date/Time:  Tuesday January 10 2015 15:08:32 EDT Ventricular Rate:  66 PR Interval:  188 QRS Duration:  83 QT Interval:  441 QTC Calculation: 462 R Axis:   -17 Text Interpretation:  Sinus rhythm Inferior infarct, old No significant  change since last tracing Confirmed by [place] ([id number]) on  01/10/2015 3:10:42 PM      MDM   Final diagnoses:  Vertigo    81 year old male with vertigo. Suspect peripheral etiology. I do have some concern with this headache though. Consider alternative diagnoses such as vertebrobasilar insufficiency or cerebellar stroke. His neuro exam is nonfocal. Will image. Symptomatic treatment.    Virgel Manifold, MD 01/19/15 [phone]

## 2015-01-10 NOTE — ED Notes (Signed)
Pt is in stable condition upon d/c and is escorted from ED via wheelchair. 

## 2015-01-10 NOTE — Discharge Instructions (Signed)

## 2015-01-11 ENCOUNTER — Telehealth: Payer: Self-pay

## 2015-01-11 MED ORDER — ONDANSETRON HCL 4 MG PO TABS: 4.0000 mg | ORAL_TABLET | Freq: Three times a day (TID) | ORAL | Status: DC | PRN

## 2015-01-11 NOTE — Telephone Encounter (Signed)
Done erx to [facility]

## 2015-01-11 NOTE — Telephone Encounter (Signed)
Pharmacy called on pt's behalf requesting an Rx for Zofran. Pt has been extremely nauseous since yesterday but his daughter died and he is requesting a limited supply of Zofran to get him through the next couple days of service for daughter. Pt was advised to make an office visit if nausea persist over the next couple days  FYI - pt was seen in the ED 01/10/2015 for HA w/ Nausea

## 2015-01-18 ENCOUNTER — Ambulatory Visit (HOSPITAL_COMMUNITY)
Admission: RE | Admit: 2015-01-18 | Discharge: 2015-01-18 | Disposition: A | Discharge status: Home / Self Care | Payer: Medicare Other | Source: Ambulatory Visit | Attending: Internal Medicine | Admitting: Internal Medicine

## 2015-01-18 ENCOUNTER — Ambulatory Visit (INDEPENDENT_AMBULATORY_CARE_PROVIDER_SITE_OTHER): Payer: Medicare Other | Admitting: Internal Medicine

## 2015-01-18 ENCOUNTER — Encounter: Payer: Self-pay | Admitting: Internal Medicine

## 2015-01-18 VITALS — BP 130/80 | HR 85 | Temp 97.7°F | Ht 69.0 in | Wt 175.0 lb

## 2015-01-18 DIAGNOSIS — R51 Headache: Secondary | ICD-10-CM | POA: Diagnosis present

## 2015-01-18 DIAGNOSIS — I10 Essential (primary) hypertension: Secondary | ICD-10-CM | POA: Diagnosis not present

## 2015-01-18 DIAGNOSIS — R42 Dizziness and giddiness: Secondary | ICD-10-CM

## 2015-01-18 DIAGNOSIS — Z23 Encounter for immunization: Secondary | ICD-10-CM

## 2015-01-18 DIAGNOSIS — R519 Headache, unspecified: Secondary | ICD-10-CM | POA: Insufficient documentation

## 2015-01-18 NOTE — Patient Instructions (Addendum)
Please continue all other medications as before, and refills have been done if requested.  Please have the pharmacy call with any other refills you may need.  Please continue your efforts at being more active, low cholesterol diet, and weight control.  You are otherwise up to date with prevention measures today.  Please keep your appointments with your specialists as you may have planned  You will be contacted regarding the referral for: Head MRI (hopefully today) - to see PCC's now

## 2015-01-18 NOTE — Assessment & Plan Note (Addendum)
Etiology unclear but unusual that onset with symptoms, persistent moderate pain, assoc with vertigo, some possible word finding today/memory difficulty, LLE weakness (could be incidental related to lumbar?) and gait change; Will need MRI Head to r/o cva or other such as malignancy, for now to continue meclizine prn and asa 81 qd, and statin   Note:  Total time for pt hx, exam, review of record with pt in the room, determination of diagnoses and plan for further eval and tx is > 40 min, with over 50% spent in coordination and counseling of patient

## 2015-01-18 NOTE — Progress Notes (Signed)
Subjective:    Patient ID: Troy Combs, male    DOB: 19-Apr-1932, 81 y.o.   MRN: [id number]  HPI  Here to fu recent ED eval for dizziness; there - 81 year old male with chief complaint of vertigo. Patient reports waking up Mon sept 11 and as he was getting out of bed he began to have a sensation that the room was spinning. Turning the eyes to the extreme left seemed to have helped but o/w was disabling. This was exacerbated by movement. Patient felt very off balance to the point that he had hold onto nearby objects to keep his balance. Reports more off balance new issue for him as well in last 3-4 months. Dizziness Associated with nausea, no vomiting. Marland Kitchen He had repeat a very similar symptoms  again as he was getting out of bed. Associated with left occipital headache. Dull in nature. This is been relatively constant, 5/10, persistent and just not improvin since onset of symptom. .  No acute visual changes. No acute numbness, tingling or focal loss of strength. No tinnitus or ear fullness. Spinning senssation has recurred regularly, some improved with meclizine  Not aware of any LE weakness, has had hx of Lumbar disc dz. . Has not tried anything for symptoms. CT head neg at ED sept 13, no fever. No falls. Past Medical History  Diagnosis Date  . Old myocardial infarction   . Unspecified essential hypertension   . Personal history of other diseases of digestive system   . Personal history of malignant melanoma of skin   . Dysthymic disorder   . Anemia, unspecified   . Thoracic or lumbosacral neuritis or radiculitis, unspecified   . Impotence of organic origin   . Malignant melanoma of skin of upper limb, including shoulder   . Bladder neck obstruction   . Coronary atherosclerosis of unspecified type of vessel, native or graft   . Impaired glucose tolerance 12/02/2012  . Allergic rhinitis 09/20/2014   Past Surgical History  Procedure Laterality Date  . Arthrodesis  07/06/2002    of left long  finger distal interphalangeal joint with Kirschner wire fixation X 3  . Coronary artery bypass graft  10/17/2000    [name]. [name], MD  . Atrial ablation surgery  05/04/2001    Dr. [name]  . Coronary angioplasty  1993  . Hernia repair      BILATERAL  . Cholecystectomy    . Exploratory laparotomy      AFTER LIVER TRAUMA AND HAND SURGERY  . Cardiac catheterization  09/05/2008    Revealing 4 of 4 patent grafts with native multivessel coronary artery disease, EF  of 60% without regional wall motion abnormalities.    reports that he quit smoking about 59 years ago. He has never used smokeless tobacco. He reports that he drinks alcohol. He reports that he does not use illicit drugs. family history includes Heart disease in his father. There is no history of Ataxia, Chorea, Dementia, Mental retardation, Migraines, Multiple sclerosis, Neurofibromatosis, Neuropathy, Parkinsonism, Seizures, or Stroke. Allergies  Allergen Reactions  . Iohexol Other (See Comments)     Code: HIVES, Desc: PATENT STATES HE IS ALLERGIC TO IV DYE 09/14/08/RM, Onset Date: 01749449   . Ciprofloxacin Other (See Comments)    Patient unsure just know he has a reaction to med   Current Outpatient Prescriptions on File Prior to Visit  Medication Sig Dispense Refill  . albuterol (PROVENTIL HFA;VENTOLIN HFA) 108 (90 BASE) MCG/ACT inhaler Inhale 2 puffs into  the lungs every 6 (six) hours as needed for wheezing or shortness of breath. 1 Inhaler 11  . aspirin 81 MG tablet Take 81 mg by mouth daily.    . carvedilol (COREG) 3.125 MG tablet Take 1 tablet (3.125 mg total) by mouth 2 (two) times daily with a meal. 60 tablet 0  . cetirizine (ZYRTEC) 10 MG tablet Take 1 tablet (10 mg total) by mouth daily. 30 tablet 11  . finasteride (PROSCAR) 5 MG tablet TAKE 1 TABLET DAILY 90 tablet 0  . meclizine (ANTIVERT) 50 MG tablet Take 0.5 tablets (25 mg total) by mouth 4 (four) times daily as needed. 30 tablet 0  . nitroGLYCERIN  (NITROSTAT) 0.4 MG SL tablet Place 1 tablet (0.4 mg total) under the tongue as directed. 30 tablet 3  . nortriptyline (PAMELOR) 25 MG capsule Take 1 capsule (25 mg total) by mouth at bedtime. 90 capsule 1  . omeprazole (PRILOSEC) 20 MG capsule Take 20 mg by mouth daily.      . ondansetron (ZOFRAN) 4 MG tablet Take 1 tablet (4 mg total) by mouth every 8 (eight) hours as needed for nausea or vomiting. 30 tablet 0  . rosuvastatin (CRESTOR) 10 MG tablet TAKE 1 TABLET DAILY 90 tablet 1  . sertraline (ZOLOFT) 50 MG tablet TAKE 1 TABLET DAILY 90 tablet 2  . tamsulosin (FLOMAX) 0.4 MG CAPS capsule TAKE 1 CAPSULE DAILY 90 capsule 2  . triamcinolone (NASACORT AQ) 55 MCG/ACT AERO nasal inhaler Place 2 sprays into the nose daily. 1 Inhaler 12   No current facility-administered medications on file prior to visit.    Review of Systems  Constitutional: Negative for unusual diaphoresis or night sweats HENT: Negative for ringing in ear or discharge Eyes: Negative for double vision or worsening visual disturbance.  Respiratory: Negative for choking and stridor.   Gastrointestinal: Negative for vomiting or other signifcant bowel change Genitourinary: Negative for hematuria or change in urine volume.  Musculoskeletal: Negative for other MSK pain or swelling Skin: Negative for color change and worsening wound.  Neurological: Negative for tremors and numbness other than noted  Psychiatric/Behavioral: Negative for decreased concentration or agitation other than above       Objective:   Physical Exam BP 130/80 mmHg  Pulse 85  Temp(Src) 97.7 F (36.5 C) (Oral)  Ht 5\' 9"  (1.753 m)  Wt 175 lb (79.379 kg)  BMI 25.83 kg/m2  SpO2 95% VS noted,  Constitutional: Pt appears in no significant distress HENT: Head: NCAT.  Right Ear: External ear normal.  Left Ear: External ear normal.  Eyes: . Pupils are equal, round, and reactive to light. Conjunctivae and EOM are normal Neck: Normal range of motion. Neck  supple.  Cardiovascular: Normal rate and regular rhythm.   Pulmonary/Chest: Effort normal and breath sounds without rales or wheezing.  Abd:  Soft, NT, ND, + BS Neurological: Pt is alert. Not confused but appears to have several instances of word finding difficulty , motor 5/5 intact except for LLE 4+/5 (pt unaware of this), cn 2-12 intact, A and O x 3, gait mild unsteady with turning, may need cane Skin: Skin is warm. No rash, no LE edema Psychiatric: Pt behavior is normal. No agitation.     Assessment & Plan:

## 2015-01-18 NOTE — Assessment & Plan Note (Signed)
stable overall by history and exam, recent data reviewed with pt, and pt to continue medical treatment as before,  to f/u any worsening symptoms or concerns BP Readings from Last 3 Encounters:  01/18/15 130/80  01/10/15 158/98  09/20/14 142/90

## 2015-01-18 NOTE — Assessment & Plan Note (Signed)
Central vs peripheral - for MRI as above, cont meclizine prn

## 2015-01-18 NOTE — Progress Notes (Signed)
Pre visit review using our clinic review tool, if applicable. No additional management support is needed unless otherwise documented below in the visit note. 

## 2015-01-25 ENCOUNTER — Other Ambulatory Visit (INDEPENDENT_AMBULATORY_CARE_PROVIDER_SITE_OTHER): Payer: Medicare Other

## 2015-01-25 ENCOUNTER — Ambulatory Visit (INDEPENDENT_AMBULATORY_CARE_PROVIDER_SITE_OTHER): Payer: Medicare Other | Admitting: Internal Medicine

## 2015-01-25 ENCOUNTER — Encounter: Payer: Self-pay | Admitting: Internal Medicine

## 2015-01-25 VITALS — BP 102/60 | HR 94 | Temp 98.3°F | Ht 69.0 in | Wt 172.0 lb

## 2015-01-25 DIAGNOSIS — Z0000 Encounter for general adult medical examination without abnormal findings: Secondary | ICD-10-CM

## 2015-01-25 DIAGNOSIS — I10 Essential (primary) hypertension: Secondary | ICD-10-CM

## 2015-01-25 DIAGNOSIS — R42 Dizziness and giddiness: Secondary | ICD-10-CM

## 2015-01-25 DIAGNOSIS — E782 Mixed hyperlipidemia: Secondary | ICD-10-CM | POA: Diagnosis not present

## 2015-01-25 DIAGNOSIS — E785 Hyperlipidemia, unspecified: Secondary | ICD-10-CM | POA: Diagnosis not present

## 2015-01-25 DIAGNOSIS — R7302 Impaired glucose tolerance (oral): Secondary | ICD-10-CM

## 2015-01-25 LAB — BASIC METABOLIC PANEL
BUN: 16 mg/dL (ref 6–23)
CO2: 28 mEq/L (ref 19–32)
Calcium: 9.3 mg/dL (ref 8.4–10.5)
Chloride: 106 mEq/L (ref 96–112)
Creatinine, Ser: 0.87 mg/dL (ref 0.40–1.50)
GFR: 89.47 mL/min (ref >60.00)
Glucose, Bld: 148 mg/dL — ABNORMAL HIGH (ref 70–99)
Potassium: 4 mEq/L (ref 3.5–5.1)
Sodium: 139 mEq/L (ref 135–145)

## 2015-01-25 LAB — LIPID PANEL
Cholesterol: 100 mg/dL (ref 0–200)
HDL: 42.4 mg/dL (ref >39.00)
LDL Cholesterol: 24 mg/dL (ref 0–99)
NonHDL: 57.13
Total CHOL/HDL Ratio: 2
Triglycerides: 167 mg/dL — ABNORMAL HIGH (ref 0.0–149.0)
VLDL: 33.4 mg/dL (ref 0.0–40.0)

## 2015-01-25 LAB — CBC WITH DIFFERENTIAL/PLATELET
Basophils Absolute: 0 10*3/uL (ref 0.0–0.1)
Basophils Relative: 0.3 % (ref 0.0–3.0)
Eosinophils Absolute: 0.3 10*3/uL (ref 0.0–0.7)
Eosinophils Relative: 3.2 % (ref 0.0–5.0)
HCT: 44.9 % (ref 39.0–52.0)
Hemoglobin: 15.2 g/dL (ref 13.0–17.0)
Lymphocytes Relative: 17.4 % (ref 12.0–46.0)
Lymphs Abs: 1.5 10*3/uL (ref 0.7–4.0)
MCHC: 33.8 g/dL (ref 30.0–36.0)
MCV: 88.6 fl (ref 78.0–100.0)
Monocytes Absolute: 0.6 10*3/uL (ref 0.1–1.0)
Monocytes Relative: 7.7 % (ref 3.0–12.0)
Neutro Abs: 6 10*3/uL (ref 1.4–7.7)
Neutrophils Relative %: 71.4 % (ref 43.0–77.0)
Platelets: 166 10*3/uL (ref 150.0–400.0)
RBC: 5.07 Mil/uL (ref 4.22–5.81)
RDW: 13.3 % (ref 11.5–15.5)
WBC: 8.4 10*3/uL (ref 4.0–10.5)

## 2015-01-25 LAB — HEPATIC FUNCTION PANEL
ALT: 18 U/L (ref 0–53)
AST: 17 U/L (ref 0–37)
Albumin: 4.2 g/dL (ref 3.5–5.2)
Alkaline Phosphatase: 48 U/L (ref 39–117)
Bilirubin, Direct: 0.2 mg/dL (ref 0.0–0.3)
Total Bilirubin: 0.8 mg/dL (ref 0.2–1.2)
Total Protein: 6.5 g/dL (ref 6.0–8.3)

## 2015-01-25 LAB — URINALYSIS, ROUTINE W REFLEX MICROSCOPIC
Bilirubin Urine: NEGATIVE
Hgb urine dipstick: NEGATIVE
Ketones, ur: NEGATIVE
Leukocytes, UA: NEGATIVE
Nitrite: NEGATIVE
Specific Gravity, Urine: 1.025 (ref 1.000–1.030)
Total Protein, Urine: NEGATIVE
Urine Glucose: NEGATIVE
Urobilinogen, UA: 0.2 (ref 0.0–1.0)
pH: 6 (ref 5.0–8.0)

## 2015-01-25 LAB — HEMOGLOBIN A1C: Hgb A1c MFr Bld: 5.7 % (ref 4.6–6.5)

## 2015-01-25 LAB — TSH: TSH: 1.21 u[IU]/mL (ref 0.35–4.50)

## 2015-01-25 MED ORDER — DIAZEPAM 10 MG PO TABS: ORAL_TABLET | ORAL | Status: DC

## 2015-01-25 NOTE — Assessment & Plan Note (Signed)
stable overall by history and exam, recent data reviewed with pt, and pt to continue medical treatment as before,  to f/u any worsening symptoms or concerns Lab Results  Component Value Date   LDLCALC 44 01/20/2014

## 2015-01-25 NOTE — Progress Notes (Signed)
Pre visit review using our clinic review tool, if applicable. No additional management support is needed unless otherwise documented below in the visit note. 

## 2015-01-25 NOTE — Patient Instructions (Signed)
Please take all new medication as prescribed - the valium x 3 days  Please continue all other medications as before, and refills have been done if requested.  Please have the pharmacy call with any other refills you may need.  Please continue your efforts at being more active, low cholesterol diet, and weight control.  You are otherwise up to date with prevention measures today.  Please keep your appointments with your specialists as you may have planned  You will be contacted regarding the referral for: Physical Therapy (neuro rehab), and Neurology  Please go to the LAB in the Basement (turn left off the elevator) for the tests to be done today  You will be contacted by phone if any changes need to be made immediately.  Otherwise, you will receive a letter about your results with an explanation, but please check with MyChart first.  Please remember to sign up for MyChart if you have not done so, as this will be important to you in the future with finding out test results, communicating by private email, and scheduling acute appointments online when needed.  Please return in 6 months, or sooner if needed

## 2015-01-25 NOTE — Assessment & Plan Note (Signed)

## 2015-01-25 NOTE — Progress Notes (Signed)
Subjective:    Patient ID: Troy Combs, male    DOB: 19-Feb-1932, 81 y.o.   MRN: [id number]  HPI  Here for wellness and f/u;  Overall doing ok;  Pt denies Chest pain, worsening SOB, DOE, wheezing, orthopnea, PND, worsening LE edema, palpitations, or syncope.  Pt denies neurological change such as new headache, facial or extremity weakness.  Pt denies polydipsia, polyuria, or low sugar symptoms. Pt states overall good compliance with treatment and medications, good tolerability, and has been trying to follow appropriate diet.  Pt denies worsening depressive symptoms, suicidal ideation or panic. No fever, night sweats, wt loss, loss of appetite, or other constitutional symptoms.  Pt states good ability with ADL's, has low fall risk, home safety reviewed and adequate, no other significant changes in hearing or vision, and only occasionally active with exercise. Daugter died 1 mo ago, not unexpected related to cirrhosis, but grieving.  Also has persistent severe dizziness mostly with head movement but recurring, not well controlled with meclizine, spent yesterday in bed with intermittent vomiting Past Medical History  Diagnosis Date  . Old myocardial infarction   . Unspecified essential hypertension   . Personal history of other diseases of digestive system   . Personal history of malignant melanoma of skin   . Dysthymic disorder   . Anemia, unspecified   . Thoracic or lumbosacral neuritis or radiculitis, unspecified   . Impotence of organic origin   . Malignant melanoma of skin of upper limb, including shoulder   . Bladder neck obstruction   . Coronary atherosclerosis of unspecified type of vessel, native or graft   . Impaired glucose tolerance 12/02/2012  . Allergic rhinitis 09/20/2014   Past Surgical History  Procedure Laterality Date  . Arthrodesis  07/06/2002    of left long finger distal interphalangeal joint with Kirschner wire fixation X 3  . Coronary artery bypass graft  10/17/2000      [name]. [name], MD  . Atrial ablation surgery  05/04/2001    Dr. [name]  . Coronary angioplasty  1993  . Hernia repair      BILATERAL  . Cholecystectomy    . Exploratory laparotomy      AFTER LIVER TRAUMA AND HAND SURGERY  . Cardiac catheterization  09/05/2008    Revealing 4 of 4 patent grafts with native multivessel coronary artery disease, EF  of 60% without regional wall motion abnormalities.    reports that he quit smoking about 59 years ago. He has never used smokeless tobacco. He reports that he drinks alcohol. He reports that he does not use illicit drugs. family history includes Heart disease in his father. There is no history of Ataxia, Chorea, Dementia, Mental retardation, Migraines, Multiple sclerosis, Neurofibromatosis, Neuropathy, Parkinsonism, Seizures, or Stroke. Allergies  Allergen Reactions  . Iohexol Other (See Comments)     Code: HIVES, Desc: PATENT STATES HE IS ALLERGIC TO IV DYE 09/14/08/RM, Onset Date: [id number]   . Ciprofloxacin Other (See Comments)    Patient unsure just know he has a reaction to med   Current Outpatient Prescriptions on File Prior to Visit  Medication Sig Dispense Refill  . albuterol (PROVENTIL HFA;VENTOLIN HFA) 108 (90 BASE) MCG/ACT inhaler Inhale 2 puffs into the lungs every 6 (six) hours as needed for wheezing or shortness of breath. 1 Inhaler 11  . aspirin 81 MG tablet Take 81 mg by mouth daily.    . carvedilol (COREG) 3.125 MG tablet Take 1 tablet (3.125 mg total) by mouth  2 (two) times daily with a meal. 60 tablet 0  . cetirizine (ZYRTEC) 10 MG tablet Take 1 tablet (10 mg total) by mouth daily. 30 tablet 11  . finasteride (PROSCAR) 5 MG tablet TAKE 1 TABLET DAILY 90 tablet 0  . meclizine (ANTIVERT) 50 MG tablet Take 0.5 tablets (25 mg total) by mouth 4 (four) times daily as needed. 30 tablet 0  . nitroGLYCERIN (NITROSTAT) 0.4 MG SL tablet Place 1 tablet (0.4 mg total) under the tongue as directed. 30 tablet 3  . nortriptyline  (PAMELOR) 25 MG capsule Take 1 capsule (25 mg total) by mouth at bedtime. 90 capsule 1  . omeprazole (PRILOSEC) 20 MG capsule Take 20 mg by mouth daily.      . ondansetron (ZOFRAN) 4 MG tablet Take 1 tablet (4 mg total) by mouth every 8 (eight) hours as needed for nausea or vomiting. 30 tablet 0  . rosuvastatin (CRESTOR) 10 MG tablet TAKE 1 TABLET DAILY 90 tablet 1  . sertraline (ZOLOFT) 50 MG tablet TAKE 1 TABLET DAILY 90 tablet 2  . tamsulosin (FLOMAX) 0.4 MG CAPS capsule TAKE 1 CAPSULE DAILY 90 capsule 2  . triamcinolone (NASACORT AQ) 55 MCG/ACT AERO nasal inhaler Place 2 sprays into the nose daily. 1 Inhaler 12   No current facility-administered medications on file prior to visit.    Review of Systems Constitutional: Negative for increased diaphoresis, other activity, appetite or siginficant weight change other than noted HENT: Negative for worsening hearing loss, ear pain, facial swelling, mouth sores and neck stiffness.   Eyes: Negative for other worsening pain, redness or visual disturbance.  Respiratory: Negative for shortness of breath and wheezing  Cardiovascular: Negative for chest pain and palpitations.  Gastrointestinal: Negative for diarrhea, blood in stool, abdominal distention or other pain Genitourinary: Negative for hematuria, flank pain or change in urine volume.  Musculoskeletal: Negative for myalgias or other joint complaints.  Skin: Negative for color change and wound or drainage.  Neurological: Negative for syncope and numbness. other than noted Hematological: Negative for adenopathy. or other swelling Psychiatric/Behavioral: Negative for hallucinations, SI, self-injury, decreased concentration or other worsening agitation.      Objective:   Physical Exam BP 102/60 mmHg  Pulse 94  Temp(Src) 98.3 F (36.8 C) (Oral)  Ht 5\' 9"  (1.753 m)  Wt 172 lb (78.019 kg)  BMI 25.39 kg/m2  SpO2 97% VS noted,  Constitutional: Pt is oriented to person, place, and time.  Appears well-developed and well-nourished, in no significant distress Head: Normocephalic and atraumatic.  Right Ear: External ear normal.  Left Ear: External ear normal.  Nose: Nose normal.  Mouth/Throat: Oropharynx is clear and moist.  Eyes: Conjunctivae and EOM are normal. Pupils are equal, round, and reactive to light.  Neck: Normal range of motion. Neck supple. No JVD present. No tracheal deviation present or significant neck LA or mass Cardiovascular: Normal rate, regular rhythm, normal heart sounds and intact distal pulses.   Pulmonary/Chest: Effort normal and breath sounds without rales or wheezing  Abdominal: Soft. Bowel sounds are normal. NT. No HSM  Musculoskeletal: Normal range of motion. Exhibits no edema.  Lymphadenopathy:  Has no cervical adenopathy.  Neurological: Pt is alert and oriented to person, place, and time. Pt has normal reflexes. No cranial nerve deficit. Motor grossly intact Skin: Skin is warm and dry. No rash noted.  Psychiatric:  Has normal mood and affect. Behavior is normal.   CLINICAL DATA: Continue [place] left posterior headache and vertigo, several months duration.  EXAM: MRI HEAD WITHOUT CONTRAST  TECHNIQUE: Multiplanar, multiecho pulse sequences of the brain and surrounding structures were obtained without intravenous contrast.  COMPARISON: Head CT 01/10/2015. MRI 10/21/2008  FINDINGS: Diffusion imaging does not show any acute or subacute infarction. There is T2 shine through in the left parietal deep white matter adjacent to the posterior body of the left lateral ventricle. This does not appear to show true restricted diffusion on the ADC map.  There are chronic small-vessel ischemic changes affecting the pons. No focal cerebellar infarction. The cerebral hemispheres show generalized atrophy with patchy and confluent chronic small vessel ischemic changes throughout the cerebral hemispheric white matter. There are old small vessel insults  affecting the thalami and basal ganglia. No cortical or large vessel territory infarction. No mass lesion, hemorrhage, hydrocephalus or extra-axial collection. No pituitary mass. No fluid in the sinuses, middle ears or mastoids. CP angle regions appear normal.  IMPRESSION: No acute or reversible finding. Extensive chronic small vessel ischemic changes throughout the brain as described above. These are somewhat progressive since the study of 2010.   Electronically Signed  By: Nelson Chimes M.D.  On: 01/18/2015 17:54      Result Notes     Notes Recorded by Biagio Borg, MD on 01/18/2015 at 7:09 PM Left message on MyChart, pt to cont same tx          Vitals     Height Weight BMI (Calculated)    5\' 9"  (1.753 m) 172 lb (78.019 kg) 25.5      Interpretation Summary     CLINICAL DATA: Continue [place] left posterior headache and vertigo, several months duration.  EXAM: MRI HEAD WITHOUT CONTRAST  TECHNIQUE: Multiplanar, multiecho pulse sequences of the brain and surrounding structures were obtained without intravenous contrast.  COMPARISON: Head CT 01/10/2015. MRI 10/21/2008  FINDINGS: Diffusion imaging does not show any acute or subacute infarction. There is T2 shine through in the left parietal deep white matter adjacent to the posterior body of the left lateral ventricle. This does not appear to show true restricted diffusion on the ADC map.  There are chronic small-vessel ischemic changes affecting the pons. No focal cerebellar infarction. The cerebral hemispheres show generalized atrophy with patchy and confluent chronic small vessel ischemic changes throughout the cerebral hemispheric white matter. There are old small vessel insults affecting the thalami and basal ganglia. No cortical or large vessel territory infarction. No mass lesion, hemorrhage, hydrocephalus or extra-axial collection. No pituitary mass. No fluid in the sinuses, middle  ears or mastoids. CP angle regions appear normal.  IMPRESSION: No acute or reversible finding. Extensive chronic small vessel ischemic changes throughout the brain as described above. These are somewhat progressive since the study of 2010.   Electronically Signed  By: Nelson Chimes M.D.  On: 01/18/2015 17:54       Assessment & Plan:

## 2015-01-25 NOTE — Assessment & Plan Note (Signed)
Recent MRI neg, not doing well with meclicizne, for neurorehab referral, also valium 10 qd x 3 days

## 2015-02-03 ENCOUNTER — Encounter: Payer: Self-pay | Admitting: Internal Medicine

## 2015-02-03 ENCOUNTER — Other Ambulatory Visit: Payer: Self-pay | Admitting: Internal Medicine

## 2015-02-03 ENCOUNTER — Telehealth: Payer: Self-pay | Admitting: Internal Medicine

## 2015-02-03 MED ORDER — DIAZEPAM 10 MG PO TABS: ORAL_TABLET | ORAL | Status: DC

## 2015-02-03 NOTE — Telephone Encounter (Signed)
Spouse advised that pt should take at the same time everyday

## 2015-02-03 NOTE — Telephone Encounter (Signed)
Dr. Linna Darner prescribed pt valium today and pts wife is wondering what time of day he should take it and if its ok to take with all other medications.  Can you please call her as soon as you can.  Her number is [phone]

## 2015-02-06 ENCOUNTER — Other Ambulatory Visit: Payer: Self-pay | Admitting: Internal Medicine

## 2015-02-06 ENCOUNTER — Telehealth: Payer: Self-pay | Admitting: Internal Medicine

## 2015-02-06 DIAGNOSIS — H8110 Benign paroxysmal vertigo, unspecified ear: Secondary | ICD-10-CM

## 2015-02-06 NOTE — Telephone Encounter (Signed)
pts wife called in and states pt is having a really bad time with his vertigo for about 4 days now.  He has an appointment with neurology in November and was hoping to get in sooner. I did give her their number and she will be calling them. She wanted me to let you know what was going on.

## 2015-02-06 NOTE — Telephone Encounter (Signed)
See referral

## 2015-02-06 NOTE — Telephone Encounter (Signed)
Pt called in and wanted to know if either of you knew or could refer Pt to a therapist that does epley maneuver?     Best number [phone]

## 2015-02-15 ENCOUNTER — Ambulatory Visit: Payer: Medicare Other | Attending: Internal Medicine | Admitting: Physical Therapy

## 2015-02-15 DIAGNOSIS — H8111 Benign paroxysmal vertigo, right ear: Secondary | ICD-10-CM | POA: Diagnosis present

## 2015-02-15 DIAGNOSIS — H8113 Benign paroxysmal vertigo, bilateral: Secondary | ICD-10-CM | POA: Insufficient documentation

## 2015-02-15 DIAGNOSIS — R2681 Unsteadiness on feet: Secondary | ICD-10-CM | POA: Diagnosis present

## 2015-02-15 DIAGNOSIS — H8112 Benign paroxysmal vertigo, left ear: Secondary | ICD-10-CM | POA: Insufficient documentation

## 2015-02-15 DIAGNOSIS — R269 Unspecified abnormalities of gait and mobility: Secondary | ICD-10-CM | POA: Insufficient documentation

## 2015-02-15 NOTE — Patient Instructions (Addendum)
Self Treatment for Horizontal Canalithiasis - Right Side Affected    Beginning on back, roll onto right side for 60 seconds, then roll onto left side. Roll right to left to right x 10 reps 2-3 times/day as tolerated. Repeat procedure if you get up during the night. Perform nightly until symptom free.  Copyright  [employer]. All rights reserved.  Sit to Side-Lying    Sit on edge of bed. 1. Turn head 45 to right. 2. Maintain head position and lie down slowly on left side. Hold until symptoms subside. 3. Sit up slowly. Hold until symptoms subside. 4. Turn head 45 to left. 5. Maintain head position and lie down slowly on right side. Hold until symptoms subside. 6. Sit up slowly. Repeat sequence __5__ times per session. Do __3-5__ sessions per day. Only do if sit to sidelying produces symptoms  Copyright  [employer]. All rights reserved.

## 2015-02-19 ENCOUNTER — Encounter: Payer: Self-pay | Admitting: Physical Therapy

## 2015-02-19 NOTE — Therapy (Signed)
[facility] [address] [place], [place], [address] Phone: [phone]   Fax:  [phone]  Physical Therapy Evaluation  Patient Details  Name: Troy Combs MRN: [id number] Date of Birth: [birth year] Referring Provider: Dr. Unice Cobble  Encounter Date: 02/15/2015      PT End of Session - 02/19/15 2201    Visit Number 1   Number of Visits 4   Date for PT Re-Evaluation 03/18/15   Authorization Type UHC Medicare   Authorization Time Period 02-15-15 - 04-29-15   PT Start Time 1147   PT Stop Time 1235   PT Time Calculation (min) 48 min      Past Medical History  Diagnosis Date  . Old myocardial infarction   . Unspecified essential hypertension   . Personal history of other diseases of digestive system   . Personal history of malignant melanoma of skin   . Dysthymic disorder   . Anemia, unspecified   . Thoracic or lumbosacral neuritis or radiculitis, unspecified   . Impotence of organic origin   . Malignant melanoma of skin of upper limb, including shoulder ([place])   . Bladder neck obstruction   . Coronary atherosclerosis of unspecified type of vessel, native or graft   . Impaired glucose tolerance 12/02/2012  . Allergic rhinitis 09/20/2014    Past Surgical History  Procedure Laterality Date  . Arthrodesis  07/06/2002    of left long finger distal interphalangeal joint with Kirschner wire fixation X 3  . Coronary artery bypass graft  10/17/2000    [name]. [name], MD  . Atrial ablation surgery  05/04/2001    Dr. [name]  . Coronary angioplasty  1993  . Hernia repair      BILATERAL  . Cholecystectomy    . Exploratory laparotomy      AFTER LIVER TRAUMA AND HAND SURGERY  . Cardiac catheterization  09/05/2008    Revealing 4 of 4 patent grafts with native multivessel coronary artery disease, EF  of 60% without regional wall motion abnormalities.    There were no vitals filed for this  visit.  Visit Diagnosis:  BPPV (benign paroxysmal positional vertigo), right - Plan: PT plan of care cert/re-cert          [facility] PT Assessment - 02/19/15 0001    Assessment   Medical Diagnosis Vertigo   Referring Provider Dr. Unice Cobble   Onset Date/Surgical Date 01/10/15   Precautions   Precautions Fall   Balance Screen   Has the patient fallen in the past 6 months No   Has the patient had a decrease in activity level because of a fear of falling?  No   Is the patient reluctant to leave their home because of a fear of falling?  No   Prior Function   Level of Independence Independent   Vocation Retired            Vestibular Assessment - 02/19/15 0001    Symptom Behavior   Type of Dizziness Spinning   Frequency of Dizziness varies   Duration of Dizziness seconds   Aggravating Factors Turning head quickly;Rolling to right;Looking up to the ceiling   Relieving Factors Lying supine   Occulomotor Exam   Occulomotor Alignment Normal      Canalith repositioning maneuver - 2 reps Bar-B-Que roll for R horizontal canalithiasis  Self Care; instructed pt in HEP for repetitive rolling for horizontal canal BPPV; educated pt and wife in  Etiology of BPPV; gave  handout on BPPV                 PT Education - 02/19/15 2200    Education provided Yes   Education Details repetitive rolling for BPPV   Person(s) Educated Patient;Spouse   Methods Explanation;Demonstration;Handout   Comprehension Verbalized understanding;Returned demonstration             PT Long Term Goals - 02/19/15 2205    PT LONG TERM GOAL #1   Title Pt will have a negative R horizontal roll test to indicate that R BPPV horizontal canal has resolved.  (03-18-15)   Time 4   Period Weeks   Status New   PT LONG TERM GOAL #2   Title Report no vertigo with bed mobility.  (03-18-15)   Time 4   Period Weeks   Status New   PT LONG TERM GOAL #3   Title Independent in HEP for habituation  prn.  (03-18-15)   Time 4   Period Weeks   Status New               Plan - 02/19/15 2202    Clinical Impression Statement Pt has signs and symptoms consistent with R horizontal canalithiasis   Pt will benefit from skilled therapeutic intervention in order to improve on the following deficits Dizziness;Decreased balance;Decreased mobility;Difficulty walking   Rehab Potential Good   PT Frequency 1x / week   PT Duration 4 weeks   PT Treatment/Interventions ADLs/Self Care Home Management;Canalith Repostioning;Gait training;Therapeutic activities;Balance training;Neuromuscular re-education;Patient/family education;Vestibular   PT Next Visit Plan check for R horizontal canal BPPV   PT Home Exercise Plan repetitive rolling   Consulted and Agree with Plan of Care Patient;Family member/caregiver   Family Member Consulted wife         Problem List Patient Active Problem List   Diagnosis Date Noted  . Headache 01/18/2015  . Vertigo 01/18/2015  . Allergic rhinitis 09/20/2014  . DOE (dyspnea on exertion) 08/10/2014  . Severe dizziness 08/10/2014  . Acute diverticulitis 05/18/2014  . Diarrhea 05/18/2014  . LLQ pain 05/13/2014  . Chronic meniscal tear of knee 09/22/2013  . Primary localized osteoarthrosis, lower leg 09/22/2013  . Neck pain, bilateral 12/18/2012  . Headache(784.0) 12/18/2012  . Vision changes 12/18/2012  . Preventative health care 12/02/2012  . Impaired glucose tolerance 12/02/2012  . Low back pain 12/02/2012  . Palpitations 10/09/2010  . B12 deficiency 08/30/2010  . Hearing loss 08/30/2010  . Tinnitus 08/30/2010  . Orthostatic hypotension 06/28/2009  . HYPERLIPIDEMIA TYPE IIB / III 05/15/2009  . CHEST PAIN UNSPECIFIED 11/23/2008  . Hypotension, unspecified 10/12/2008  . DIZZINESS 10/12/2008  . Shortness of breath 09/14/2008  . UNSPECIFIED ANEMIA 09/13/2008  . ANXIETY DEPRESSION 09/13/2008  . Old myocardial infarction 09/13/2008  . GASTROESOPHAGEAL  REFLUX DISEASE, HX OF 09/13/2008  . LUMBAR RADICULOPATHY, LEFT 08/23/2008  . MELANOMA, SHOULDER 11/27/2006  . Essential hypertension 11/27/2006  . Coronary atherosclerosis 11/27/2006  . OBSTRUCTION, BLADDER NECK 11/27/2006  . ERECTILE DYSFUNCTION, ORGANIC 11/27/2006    Alda Lea, PT 02/19/2015, 10:12 PM  [facility] [address] [place], [place], [address] Phone: [phone]   Fax:  [phone]  Name: Troy Combs MRN: [id number] Date of Birth: August 22, 1931

## 2015-02-20 ENCOUNTER — Ambulatory Visit: Payer: Medicare Other | Admitting: Physical Therapy

## 2015-02-20 DIAGNOSIS — H8113 Benign paroxysmal vertigo, bilateral: Secondary | ICD-10-CM

## 2015-02-20 DIAGNOSIS — H8111 Benign paroxysmal vertigo, right ear: Secondary | ICD-10-CM | POA: Diagnosis not present

## 2015-02-23 ENCOUNTER — Ambulatory Visit: Payer: Medicare Other | Admitting: Physical Therapy

## 2015-02-23 ENCOUNTER — Encounter: Payer: Self-pay | Admitting: Physical Therapy

## 2015-02-23 DIAGNOSIS — H8111 Benign paroxysmal vertigo, right ear: Secondary | ICD-10-CM | POA: Diagnosis not present

## 2015-02-23 DIAGNOSIS — H8112 Benign paroxysmal vertigo, left ear: Secondary | ICD-10-CM

## 2015-02-23 NOTE — Therapy (Signed)
[facility] [address] [place], [place], [address] Phone: [phone]   Fax:  [phone]  Physical Therapy Treatment  Patient Details  Name: Troy Combs MRN: [id number] Date of Birth: 1931/11/29 Referring Provider: Dr. Unice Cobble  Encounter Date: 02/20/2015      PT End of Session - 02/26/15 1937    Visit Number 2   Number of Visits 4   Date for PT Re-Evaluation 03/18/15   Authorization Type UHC Medicare   Authorization Time Period 02-15-15 - 04-29-15   PT Start Time 0932   PT Stop Time 1016   PT Time Calculation (min) 44 min      Past Medical History  Diagnosis Date  . Old myocardial infarction   . Unspecified essential hypertension   . Personal history of other diseases of digestive system   . Personal history of malignant melanoma of skin   . Dysthymic disorder   . Anemia, unspecified   . Thoracic or lumbosacral neuritis or radiculitis, unspecified   . Impotence of organic origin   . Malignant melanoma of skin of upper limb, including shoulder ([place])   . Bladder neck obstruction   . Coronary atherosclerosis of unspecified type of vessel, native or graft   . Impaired glucose tolerance 12/02/2012  . Allergic rhinitis 09/20/2014    Past Surgical History  Procedure Laterality Date  . Arthrodesis  07/06/2002    of left long finger distal interphalangeal joint with Kirschner wire fixation X 3  . Coronary artery bypass graft  10/17/2000    [name]. [name], MD  . Atrial ablation surgery  05/04/2001    Dr. [name]  . Coronary angioplasty  1993  . Hernia repair      BILATERAL  . Cholecystectomy    . Exploratory laparotomy      AFTER LIVER TRAUMA AND HAND SURGERY  . Cardiac catheterization  09/05/2008    Revealing 4 of 4 patent grafts with native multivessel coronary artery disease, EF  of 60% without regional wall motion abnormalities.    There were no vitals filed for this  visit.  Visit Diagnosis:  BPPV (benign paroxysmal positional vertigo), bilateral      Subjective Assessment - 02/26/15 1936    Subjective Pt. states he is cotinuing to have vertigo - is "not a lot better"   Patient is accompained by: Family member   Patient Stated Goals resolve the vertigo   Currently in Pain? No/denies        Canalith repositioning maneuver - Bar-b -que roll for R horizontal canalithiasis - 3 reps  (+) L Dix-Hallpike with L rotary, upbeating nystagmus Epley maneuver x 2 reps for L posterior canalithiasis  Instructed pt in Brandt-Daroff exercises for L BPPV -posterior canalithiasis                              PT Long Term Goals - 02/19/15 2205    PT LONG TERM GOAL #1   Title Pt will have a negative R horizontal roll test to indicate that R BPPV horizontal canal has resolved.  (03-18-15)   Time 4   Period Weeks   Status New   PT LONG TERM GOAL #2   Title Report no vertigo with bed mobility.  (03-18-15)   Time 4   Period Weeks   Status New   PT LONG TERM GOAL #3   Title Independent in HEP for habituation prn.  (  03-18-15)   Time 4   Period Weeks   Status New               Problem List Patient Active Problem List   Diagnosis Date Noted  . Headache 01/18/2015  . Vertigo 01/18/2015  . Allergic rhinitis 09/20/2014  . DOE (dyspnea on exertion) 08/10/2014  . Severe dizziness 08/10/2014  . Acute diverticulitis 05/18/2014  . Diarrhea 05/18/2014  . LLQ pain 05/13/2014  . Chronic meniscal tear of knee 09/22/2013  . Primary localized osteoarthrosis, lower leg 09/22/2013  . Neck pain, bilateral 12/18/2012  . Headache(784.0) 12/18/2012  . Vision changes 12/18/2012  . Preventative health care 12/02/2012  . Impaired glucose tolerance 12/02/2012  . Low back pain 12/02/2012  . Palpitations 10/09/2010  . B12 deficiency 08/30/2010  . Hearing loss 08/30/2010  . Tinnitus 08/30/2010  . Orthostatic hypotension 06/28/2009  .  HYPERLIPIDEMIA TYPE IIB / III 05/15/2009  . CHEST PAIN UNSPECIFIED 11/23/2008  . Hypotension, unspecified 10/12/2008  . DIZZINESS 10/12/2008  . Shortness of breath 09/14/2008  . UNSPECIFIED ANEMIA 09/13/2008  . ANXIETY DEPRESSION 09/13/2008  . Old myocardial infarction 09/13/2008  . GASTROESOPHAGEAL REFLUX DISEASE, HX OF 09/13/2008  . LUMBAR RADICULOPATHY, LEFT 08/23/2008  . MELANOMA, SHOULDER 11/27/2006  . Essential hypertension 11/27/2006  . Coronary atherosclerosis 11/27/2006  . OBSTRUCTION, BLADDER NECK 11/27/2006  . ERECTILE DYSFUNCTION, ORGANIC 11/27/2006    Madgeline Rayo, [place], PT 02/26/2015, 7:40 PM  [facility] [address] [place], [place], [address] Phone: [phone]   Fax:  [phone]  Name: JAHI ROZA MRN: [id number] Date of Birth: October 15, 1931

## 2015-02-23 NOTE — Patient Instructions (Signed)
Sit to Side-Lying   Sit on edge of bed. 1. Turn head 45 to right. 2. Maintain head position and lie down slowly on left side. Hold until symptoms subside. 3. Sit up slowly. Hold until symptoms subside. 4. Turn head 45 to left. 5. Maintain head position and lie down slowly on right side. Hold until symptoms subside. 6. Sit up slowly. Repeat sequence _5___ times per session. Do __3-5__ sessions per day.  Copyright  VHI. All rights reserved.   

## 2015-02-26 ENCOUNTER — Encounter: Payer: Self-pay | Admitting: Physical Therapy

## 2015-02-27 ENCOUNTER — Encounter: Payer: Self-pay | Admitting: Physical Therapy

## 2015-02-27 ENCOUNTER — Ambulatory Visit: Payer: Medicare Other | Admitting: Physical Therapy

## 2015-02-27 DIAGNOSIS — H8112 Benign paroxysmal vertigo, left ear: Secondary | ICD-10-CM

## 2015-02-27 DIAGNOSIS — R2681 Unsteadiness on feet: Secondary | ICD-10-CM

## 2015-02-27 DIAGNOSIS — R269 Unspecified abnormalities of gait and mobility: Secondary | ICD-10-CM

## 2015-02-27 DIAGNOSIS — H8111 Benign paroxysmal vertigo, right ear: Secondary | ICD-10-CM | POA: Diagnosis not present

## 2015-02-27 NOTE — Patient Instructions (Addendum)
Hamstring Step 3    Left leg in maximal straight leg raise, heel at maximal stretch, straighten knee further by tightening knee cap. Warning: Intense stretch. Stay within tolerance. Hold _30__ seconds. Relax knee cap only. Repeat __2_ times.  Copyright  VHI. All rights reserved.  Bridging    Slowly raise buttocks from floor, keeping stomach tight. Repeat __10__ times per set. Do __1 sets per session. Do _1__ sessions per day.  http://orth.exer.us/1096   Copyright  VHI. All rights reserved.  Anterior Pelvic Tilt (Supine)    Lie on back with knees bent. Arch back and Hold _5___ seconds. Flatten back to relax. Repeat _10___ times. Do _2_ sessions per day.  http://gt2.exer.us/683   Copyright  VHI. All rights reserved.

## 2015-02-27 NOTE — Therapy (Signed)
[facility] [address] [place], [place], [address] Phone: [phone]   Fax:  [phone]  Physical Therapy Treatment  Patient Details  Name: Troy Combs MRN: [id number] Date of Birth: 1932/02/08 Referring Provider: Dr. Unice Cobble  Encounter Date: 02/27/2015      PT End of Session - 02/27/15 2106    Visit Number 4  G4   Number of Visits 4   Date for PT Re-Evaluation 03/18/15   Authorization Type UHC Medicare   PT Start Time [phone]   PT Stop Time 0930   PT Time Calculation (min) 54 min      Past Medical History  Diagnosis Date  . Old myocardial infarction   . Unspecified essential hypertension   . Personal history of other diseases of digestive system   . Personal history of malignant melanoma of skin   . Dysthymic disorder   . Anemia, unspecified   . Thoracic or lumbosacral neuritis or radiculitis, unspecified   . Impotence of organic origin   . Malignant melanoma of skin of upper limb, including shoulder ([place])   . Bladder neck obstruction   . Coronary atherosclerosis of unspecified type of vessel, native or graft   . Impaired glucose tolerance 12/02/2012  . Allergic rhinitis 09/20/2014    Past Surgical History  Procedure Laterality Date  . Arthrodesis  07/06/2002    of left long finger distal interphalangeal joint with Kirschner wire fixation X 3  . Coronary artery bypass graft  10/17/2000    [name]. [name], MD  . Atrial ablation surgery  05/04/2001    Dr. [name]  . Coronary angioplasty  1993  . Hernia repair      BILATERAL  . Cholecystectomy    . Exploratory laparotomy      AFTER LIVER TRAUMA AND HAND SURGERY  . Cardiac catheterization  09/05/2008    Revealing 4 of 4 patent grafts with native multivessel coronary artery disease, EF  of 60% without regional wall motion abnormalities.    There were no vitals filed for this visit.  Visit Diagnosis:  BPPV (benign paroxysmal  positional vertigo), left  Abnormality of gait  Unsteadiness on feet      Subjective Assessment - 02/27/15 2053    Subjective Pt. states he is now having balance problems/unsteadiness but states that the vertigo is gone   Patient is accompained by: Family member   Patient Stated Goals resolve the vertigo                         [facility] Adult PT Treatment/Exercise - 02/27/15 0001    Exercises   Exercises Lumbar   Lumbar Exercises: Stretches   Active Hamstring Stretch 2 reps;30 seconds   Single Knee to Chest Stretch 1 rep;10 seconds   Double Knee to Chest Stretch 1 rep;20 seconds   Lower Trunk Rotation 1 rep;20 seconds  to R side for L side stretch   Pelvic Tilt 5 reps   Prone on Elbows Stretch 1 rep;10 seconds             Balance Exercises - 02/27/15 2102    Balance Exercises: Standing   Standing Eyes Opened Wide (BOA);Narrow base of support (BOS);Foam/compliant surface;Head turns;5 reps   Standing Eyes Closed Narrow base of support (BOS);Wide (BOA);Head turns;Foam/compliant surface;5 reps           PT Education - 02/27/15 2105    Education provided Yes   Education Details  back exercise packet   Person(s) Educated Patient;Spouse   Methods Explanation;Demonstration;Handout   Comprehension Verbalized understanding;Returned demonstration             PT Long Term Goals - 02/19/15 2205    PT LONG TERM GOAL #1   Title Pt will have a negative R horizontal roll test to indicate that R BPPV horizontal canal has resolved.  (03-18-15)   Time 4   Period Weeks   Status New   PT LONG TERM GOAL #2   Title Report no vertigo with bed mobility.  (03-18-15)   Time 4   Period Weeks   Status New   PT LONG TERM GOAL #3   Title Independent in HEP for habituation prn.  (03-18-15)   Time 4   Period Weeks   Status New               Plan - 02/27/15 2107    Clinical Impression Statement Vertigo appears to have resolved but pt now presents with  balance deficits and unsteadiness   Pt will benefit from skilled therapeutic intervention in order to improve on the following deficits Dizziness;Decreased balance;Decreased mobility;Difficulty walking   Rehab Potential Good   PT Frequency 2x / week   PT Duration 4 weeks   PT Treatment/Interventions ADLs/Self Care Home Management;Canalith Repostioning;Gait training;Therapeutic activities;Balance training;Neuromuscular re-education;Patient/family education;Vestibular   PT Next Visit Plan check HEP - back packet and balance on foam   PT Home Exercise Plan back exercises and balance on foam   Consulted and Agree with Plan of Care Patient;Family member/caregiver   Family Member Consulted wife        Problem List Patient Active Problem List   Diagnosis Date Noted  . Headache 01/18/2015  . Vertigo 01/18/2015  . Allergic rhinitis 09/20/2014  . DOE (dyspnea on exertion) 08/10/2014  . Severe dizziness 08/10/2014  . Acute diverticulitis 05/18/2014  . Diarrhea 05/18/2014  . LLQ pain 05/13/2014  . Chronic meniscal tear of knee 09/22/2013  . Primary localized osteoarthrosis, lower leg 09/22/2013  . Neck pain, bilateral 12/18/2012  . Headache(784.0) 12/18/2012  . Vision changes 12/18/2012  . Preventative health care 12/02/2012  . Impaired glucose tolerance 12/02/2012  . Low back pain 12/02/2012  . Palpitations 10/09/2010  . B12 deficiency 08/30/2010  . Hearing loss 08/30/2010  . Tinnitus 08/30/2010  . Orthostatic hypotension 06/28/2009  . HYPERLIPIDEMIA TYPE IIB / III 05/15/2009  . CHEST PAIN UNSPECIFIED 11/23/2008  . Hypotension, unspecified 10/12/2008  . DIZZINESS 10/12/2008  . Shortness of breath 09/14/2008  . UNSPECIFIED ANEMIA 09/13/2008  . ANXIETY DEPRESSION 09/13/2008  . Old myocardial infarction 09/13/2008  . GASTROESOPHAGEAL REFLUX DISEASE, HX OF 09/13/2008  . LUMBAR RADICULOPATHY, LEFT 08/23/2008  . MELANOMA, SHOULDER 11/27/2006  . Essential hypertension 11/27/2006  .  Coronary atherosclerosis 11/27/2006  . OBSTRUCTION, BLADDER NECK 11/27/2006  . ERECTILE DYSFUNCTION, ORGANIC 11/27/2006    Alda Lea, PT 02/27/2015, 9:10 PM  [facility] [address] [place], [place], [address] Phone: [phone]   Fax:  [phone]  Name: Troy Combs MRN: [id number] Date of Birth: [birth year]

## 2015-02-27 NOTE — Therapy (Signed)
[facility] [address] [place], [place], [address] Phone: [phone]   Fax:  [phone]  Physical Therapy Treatment  Patient Details  Name: Troy Combs MRN: [id number] Date of Birth: 1931-08-15 Referring Provider: Dr. Unice Cobble  Encounter Date: 02/23/2015      PT End of Session - 02/27/15 0824    Visit Number 3  G3   Number of Visits 4   Date for PT Re-Evaluation 03/18/15   Authorization Type UHC Medicare   Authorization Time Period 02-15-15 - 04-29-15   PT Start Time 1622   PT Stop Time 1654   PT Time Calculation (min) 32 min      Past Medical History  Diagnosis Date  . Old myocardial infarction   . Unspecified essential hypertension   . Personal history of other diseases of digestive system   . Personal history of malignant melanoma of skin   . Dysthymic disorder   . Anemia, unspecified   . Thoracic or lumbosacral neuritis or radiculitis, unspecified   . Impotence of organic origin   . Malignant melanoma of skin of upper limb, including shoulder ([place])   . Bladder neck obstruction   . Coronary atherosclerosis of unspecified type of vessel, native or graft   . Impaired glucose tolerance 12/02/2012  . Allergic rhinitis 09/20/2014    Past Surgical History  Procedure Laterality Date  . Arthrodesis  07/06/2002    of left long finger distal interphalangeal joint with Kirschner wire fixation X 3  . Coronary artery bypass graft  10/17/2000    [name]. [name], MD  . Atrial ablation surgery  05/04/2001    Dr. [name]  . Coronary angioplasty  1993  . Hernia repair      BILATERAL  . Cholecystectomy    . Exploratory laparotomy      AFTER LIVER TRAUMA AND HAND SURGERY  . Cardiac catheterization  09/05/2008    Revealing 4 of 4 patent grafts with native multivessel coronary artery disease, EF  of 60% without regional wall motion abnormalities.    There were no vitals filed for this  visit.  Visit Diagnosis:  BPPV (benign paroxysmal positional vertigo), left      Subjective Assessment - 02/27/15 0823    Subjective Pt reports Wed. was a great day - very minimal vertigo - but then it returned this am   Patient is accompained by: Family member   Patient Stated Goals resolve the vertigo   Currently in Pain? No/denies     Pt has (+) L Dix-Hallpike test indicative of L BPPV posterior canalithiasis  Epley maneuver performed 3 reps for L BPPV - minimal nystagmus and minimal c/o vertigo reported on 2nd rep and no  Nystagmus and no c/o vertigo on 3rd rep reported  Reviewed Brandt-Daroff exercises - recommended pt perform if he continues to have vertigo after today's treatment                              PT Long Term Goals - 02/19/15 2205    PT LONG TERM GOAL #1   Title Pt will have a negative R horizontal roll test to indicate that R BPPV horizontal canal has resolved.  (03-18-15)   Time 4   Period Weeks   Status New   PT LONG TERM GOAL #2   Title Report no vertigo with bed mobility.  (03-18-15)   Time 4   Period Weeks  Status New   PT LONG TERM GOAL #3   Title Independent in HEP for habituation prn.  (03-18-15)   Time 4   Period Weeks   Status New               Plan - 02/27/15 0825    Clinical Impression Statement Vertigo appears to have mostly resolved on rep #3 of Epley's for L BPPV posterior canalithiasis - no nystagmus observed on rep #3 with very little noted on 2nd rep   Pt will benefit from skilled therapeutic intervention in order to improve on the following deficits Dizziness;Decreased balance;Decreased mobility;Difficulty walking   Rehab Potential Good   PT Frequency 2x / week   PT Duration 4 weeks   PT Treatment/Interventions ADLs/Self Care Home Management;Canalith Repostioning;Gait training;Therapeutic activities;Balance training;Neuromuscular re-education;Patient/family education;Vestibular   PT Next Visit Plan  check for L BPPV - posterior canal   PT Home Exercise Plan Nestor Lewandowsky for L BPPV   Consulted and Agree with Plan of Care Patient;Family member/caregiver   Family Member Consulted wife        Problem List Patient Active Problem List   Diagnosis Date Noted  . Headache 01/18/2015  . Vertigo 01/18/2015  . Allergic rhinitis 09/20/2014  . DOE (dyspnea on exertion) 08/10/2014  . Severe dizziness 08/10/2014  . Acute diverticulitis 05/18/2014  . Diarrhea 05/18/2014  . LLQ pain 05/13/2014  . Chronic meniscal tear of knee 09/22/2013  . Primary localized osteoarthrosis, lower leg 09/22/2013  . Neck pain, bilateral 12/18/2012  . Headache(784.0) 12/18/2012  . Vision changes 12/18/2012  . Preventative health care 12/02/2012  . Impaired glucose tolerance 12/02/2012  . Low back pain 12/02/2012  . Palpitations 10/09/2010  . B12 deficiency 08/30/2010  . Hearing loss 08/30/2010  . Tinnitus 08/30/2010  . Orthostatic hypotension 06/28/2009  . HYPERLIPIDEMIA TYPE IIB / III 05/15/2009  . CHEST PAIN UNSPECIFIED 11/23/2008  . Hypotension, unspecified 10/12/2008  . DIZZINESS 10/12/2008  . Shortness of breath 09/14/2008  . UNSPECIFIED ANEMIA 09/13/2008  . ANXIETY DEPRESSION 09/13/2008  . Old myocardial infarction 09/13/2008  . GASTROESOPHAGEAL REFLUX DISEASE, HX OF 09/13/2008  . LUMBAR RADICULOPATHY, LEFT 08/23/2008  . MELANOMA, SHOULDER 11/27/2006  . Essential hypertension 11/27/2006  . Coronary atherosclerosis 11/27/2006  . OBSTRUCTION, BLADDER NECK 11/27/2006  . ERECTILE DYSFUNCTION, ORGANIC 11/27/2006    Alda Lea, PT 02/27/2015, 8:27 AM  [facility] [address] [place], [place], [address] Phone: [phone]   Fax:  [phone]  Name: Troy Combs MRN: [id number] Date of Birth: Feb 12, 1932

## 2015-03-03 ENCOUNTER — Encounter: Payer: Self-pay | Admitting: Neurology

## 2015-03-03 ENCOUNTER — Ambulatory Visit (INDEPENDENT_AMBULATORY_CARE_PROVIDER_SITE_OTHER): Payer: Medicare Other | Admitting: Neurology

## 2015-03-03 ENCOUNTER — Other Ambulatory Visit: Payer: Medicare Other

## 2015-03-03 VITALS — BP 120/64 | HR 85 | Ht 70.0 in | Wt 175.2 lb

## 2015-03-03 DIAGNOSIS — G629 Polyneuropathy, unspecified: Secondary | ICD-10-CM

## 2015-03-03 DIAGNOSIS — H8111 Benign paroxysmal vertigo, right ear: Secondary | ICD-10-CM

## 2015-03-03 MED ORDER — ONDANSETRON HCL 4 MG PO TABS: 4.0000 mg | ORAL_TABLET | Freq: Three times a day (TID) | ORAL | Status: DC | PRN

## 2015-03-03 NOTE — Patient Instructions (Signed)
1. Bloodwork for B12,methylmalonic acid, RPR, ESR, SPEP/IFE 2. Continue with PT for balance therapy 3. If vertigo recurs, try home Epley maneuver, and if not effective, call Vestibular therapy again 4. Follow-up in 3 months, call for any changes 

## 2015-03-03 NOTE — Progress Notes (Signed)
NEUROLOGY FOLLOW UP OFFICE NOTE  Troy Combs [id number]  HISTORY OF PRESENT ILLNESS: I had the pleasure of seeing Troy Combs in the neurology clinic on 03/03/2015 for vertigo and headaches. He had been previously seen by one of my partners, Dr. [name] in 2014 for cervicogenic headaches. He presents today for evaluation of vertigo that started in April/May 2016. He would have a sensation of tilting when he sat on the edge of the bed, with violent vomiting. He was prescribed meclizine, which would help sometimes. There was concern this was due to his beta-blocker, and after adjustment, he reported the intense dizziness went away, but he still had dizziness if moving too quickly, turning his head, or standing up. He was in the ER in September due to significant vertigo with dull headache. He had an MRI brain without contrast which I personally reviewed, no acute changes, there was chronic microvascular disease and generalized atrophy. He saw his PCP and was referred for vestibular rehab. He reports that vestibular rehab worked almost immediately. The dizziness has since resolved over the past month. He has a little lightheadedness and feels off balance and unsteady, no falls. He has occasional tingling and numbness in the last 3 digits of both feet, and occasionally pins and needles sensation in the soles of his feet. He has had monocular vertical diplopia for many years, and noticed that this was less when he takes nortriptyline. He was having occipital headaches, but now reports those are pretty much gone, with 1 to 2 over 10 pain, no associated nausea, vomiting, photo/phonophobia. He has some neck crepitus, "like gravel when turning my head," and occasional low back pain. He has some constipation. No family history of similar symptoms. He is asking for a refill on Zofran, reporting waking up yesterday violently sick to his stomach, with no associated vertigo. This has resolved.   PAST MEDICAL  HISTORY: Past Medical History  Diagnosis Date  . Old myocardial infarction   . Unspecified essential hypertension   . Personal history of other diseases of digestive system   . Personal history of malignant melanoma of skin   . Dysthymic disorder   . Anemia, unspecified   . Thoracic or lumbosacral neuritis or radiculitis, unspecified   . Impotence of organic origin   . Malignant melanoma of skin of upper limb, including shoulder ([place])   . Bladder neck obstruction   . Coronary atherosclerosis of unspecified type of vessel, native or graft   . Impaired glucose tolerance 12/02/2012  . Allergic rhinitis 09/20/2014    MEDICATIONS: Current Outpatient Prescriptions on File Prior to Visit  Medication Sig Dispense Refill  . albuterol (PROVENTIL HFA;VENTOLIN HFA) 108 (90 BASE) MCG/ACT inhaler Inhale 2 puffs into the lungs every 6 (six) hours as needed for wheezing or shortness of breath. 1 Inhaler 11  . aspirin 81 MG tablet Take 81 mg by mouth daily.    . carvedilol (COREG) 3.125 MG tablet Take 1 tablet (3.125 mg total) by mouth 2 (two) times daily with a meal. 60 tablet 0  . cetirizine (ZYRTEC) 10 MG tablet Take 1 tablet (10 mg total) by mouth daily. 30 tablet 11  . finasteride (PROSCAR) 5 MG tablet TAKE 1 TABLET DAILY 90 tablet 0  . meclizine (ANTIVERT) 50 MG tablet Take 0.5 tablets (25 mg total) by mouth 4 (four) times daily as needed. 30 tablet 0  . nitroGLYCERIN (NITROSTAT) 0.4 MG SL tablet Place 1 tablet (0.4 mg total) under the tongue as directed. [place]  tablet 3  . nortriptyline (PAMELOR) 25 MG capsule Take 1 capsule (25 mg total) by mouth at bedtime. 90 capsule 1  . omeprazole (PRILOSEC) 20 MG capsule Take 20 mg by mouth daily.      . ondansetron (ZOFRAN) 4 MG tablet Take 1 tablet (4 mg total) by mouth every 8 (eight) hours as needed for nausea or vomiting. 30 tablet 0  . rosuvastatin (CRESTOR) 10 MG tablet TAKE 1 TABLET DAILY 90 tablet 1  . sertraline (ZOLOFT) 50 MG tablet TAKE 1 TABLET  DAILY 90 tablet 2  . tamsulosin (FLOMAX) 0.4 MG CAPS capsule TAKE 1 CAPSULE DAILY 90 capsule 2   No current facility-administered medications on file prior to visit.    ALLERGIES: Allergies  Allergen Reactions  . Iohexol Other (See Comments)     Code: HIVES, Desc: PATENT STATES HE IS ALLERGIC TO IV DYE 09/14/08/RM, Onset Date: [id number]   . Ciprofloxacin Other (See Comments)    Patient unsure just know he has a reaction to med    FAMILY HISTORY: Family History  Problem Relation Age of Onset  . Heart disease Father   . Ataxia Neg Hx   . Chorea Neg Hx   . Dementia Neg Hx   . Mental retardation Neg Hx   . Migraines Neg Hx   . Multiple sclerosis Neg Hx   . Neurofibromatosis Neg Hx   . Neuropathy Neg Hx   . Parkinsonism Neg Hx   . Seizures Neg Hx   . Stroke Neg Hx     SOCIAL HISTORY: Social History   Social History  . Marital Status: Married    Spouse Name: N/A  . Number of Children: N/A  . Years of Education: N/A   Occupational History  . Not on file.   Social History Main Topics  . Smoking status: Former Smoker    Quit date: 09/21/1955  . Smokeless tobacco: Never Used  . Alcohol Use: 0.0 oz/week    0 Standard drinks or equivalent per week     Comment: one or two ounces a week  . Drug Use: No  . Sexual Activity: Not on file   Other Topics Concern  . Not on file   Social History Narrative   Lives with wife in a one story home.  Has 5 children.  Retired Freight forwarder for [employer].  Education: 2 years of college.     REVIEW OF SYSTEMS: Constitutional: No fevers, chills, or sweats, no generalized fatigue, change in appetite Eyes: No visual changes, double vision, eye pain Ear, nose and throat: No hearing loss, ear pain, nasal congestion, sore throat Cardiovascular: No chest pain, palpitations Respiratory:  No shortness of breath at rest or with exertion, wheezes GastrointestinaI: No nausea, vomiting, diarrhea, abdominal pain, fecal incontinence Genitourinary:  No  dysuria, urinary retention or frequency Musculoskeletal:  + neck pain, back pain Integumentary: No rash, pruritus, skin lesions Neurological: as above Psychiatric: No depression, insomnia, anxiety Endocrine: No palpitations, fatigue, diaphoresis, mood swings, change in appetite, change in weight, increased thirst Hematologic/Lymphatic:  No anemia, purpura, petechiae. Allergic/Immunologic: no itchy/runny eyes, nasal congestion, recent allergic reactions, rashes  PHYSICAL EXAM: Filed Vitals:   03/03/15 1415  BP: 120/64  Pulse: 85   General: No acute distress Head:  Normocephalic/atraumatic Neck: supple, no paraspinal tenderness, full range of motion Heart:  Regular rate and rhythm Lungs:  Clear to auscultation bilaterally Back: No paraspinal tenderness Skin/Extremities: No rash, no edema Neurological Exam: alert and oriented to person, place, and time. No  aphasia or dysarthria. Fund of knowledge is appropriate.  Recent and remote memory are intact.  Attention and concentration are normal.    Able to name objects and repeat phrases. Cranial nerves: Pupils equal, round, reactive to light.  Fundoscopic exam unremarkable, no papilledema. Extraocular movements intact with no nystagmus. Visual fields full. Facial sensation intact. No facial asymmetry. Tongue, uvula, palate midline.  Motor: Bulk and tone normal, muscle strength 5/5 throughout with no pronator drift.  Sensation: intact to all modalities on both UE, decreased cold up to both ankles, intact pin, decreased vibration sense to left knee and right ankle, decreased joint position sense to left ankle. Deep tendon reflexes 2+ throughout, toes downgoing.  Finger to nose testing intact.  Gait narrow-based and steady, mild difficulty with tandem walk but able. Romberg negative.  IMPRESSION: This is a pleasant 81 year old right-handed man with a history of hypertension, CAD s/p MI and CABG, orthostatic hypotension, cervicogenic headaches,  presenting for evaluation of vertigo and unsteadiness. The vertigo has completely resolved with vestibular rehab, rehab notes reviewed, exam consistent with right BPPV. He still has a sensation of lightheadedness/unsteadiness, which may be multifactorial from history of orthostatic hypotension, as well as mild sensory ataxia due to peripheral neuropathy. He is noted to have a length-dependent neuropathy on exam today. Neuropathy labs will be ordered. We discussed that if symptoms worsen, he will be scheduled for an EMG/NCV, otherwise continue with balance therapy. He has home Epley maneuver exercises to do if BPPV recurs, and knows to call vestibular rehab if home exercises are not effective. He will follow-up in 3 months and knows to call for any changes.   Thank you for allowing me to participate in his care.  Please do not hesitate to call for any questions or concerns.  The duration of this appointment visit was 50 minutes of face-to-face time with the patient.  Greater than 50% of this time was spent in counseling, explanation of diagnosis, planning of further management, and coordination of care.   Ellouise Newer, M.D.   CC: Dr. Jenny Reichmann

## 2015-03-06 ENCOUNTER — Encounter: Payer: Self-pay | Admitting: Neurology

## 2015-03-06 DIAGNOSIS — H8111 Benign paroxysmal vertigo, right ear: Secondary | ICD-10-CM | POA: Insufficient documentation

## 2015-03-06 DIAGNOSIS — G629 Polyneuropathy, unspecified: Secondary | ICD-10-CM | POA: Insufficient documentation

## 2015-03-06 DIAGNOSIS — H8110 Benign paroxysmal vertigo, unspecified ear: Secondary | ICD-10-CM | POA: Insufficient documentation

## 2015-03-08 ENCOUNTER — Ambulatory Visit: Payer: Medicare Other | Attending: Internal Medicine | Admitting: Physical Therapy

## 2015-03-08 DIAGNOSIS — M542 Cervicalgia: Secondary | ICD-10-CM | POA: Insufficient documentation

## 2015-03-08 DIAGNOSIS — R2681 Unsteadiness on feet: Secondary | ICD-10-CM | POA: Insufficient documentation

## 2015-03-08 DIAGNOSIS — R269 Unspecified abnormalities of gait and mobility: Secondary | ICD-10-CM | POA: Diagnosis present

## 2015-03-09 ENCOUNTER — Ambulatory Visit: Payer: Medicare Other | Admitting: Physical Therapy

## 2015-03-09 DIAGNOSIS — M542 Cervicalgia: Secondary | ICD-10-CM

## 2015-03-11 ENCOUNTER — Encounter: Payer: Self-pay | Admitting: Physical Therapy

## 2015-03-11 NOTE — Therapy (Signed)
Troy Combs [address] [place], [place], [address] Phone: [phone]   Fax:  [phone]  Physical Therapy Treatment  Patient Details  Name: Troy Combs MRN: [id number] Date of Birth: [birth year] Referring Provider: Dr. Unice Cobble  Encounter Date: 03/08/2015      PT End of Session - 03/11/15 0730    Visit Number 5   Number of Visits 5   Date for PT Re-Evaluation 03/18/15   Authorization Type [facility] Medicare   Authorization Time Period 02-15-15 - 04-29-15   PT Start Time 0933   PT Stop Time 1015   PT Time Calculation (min) 42 min      Past Medical History  Diagnosis Date  . Old myocardial infarction   . Unspecified essential hypertension   . Personal history of other diseases of digestive system   . Personal history of malignant melanoma of skin   . Dysthymic disorder   . Anemia, unspecified   . Thoracic or lumbosacral neuritis or radiculitis, unspecified   . Impotence of organic origin   . Malignant melanoma of skin of upper limb, including shoulder ([place])   . Bladder neck obstruction   . Coronary atherosclerosis of unspecified type of vessel, native or graft   . Impaired glucose tolerance 12/02/2012  . Allergic rhinitis 09/20/2014    Past Surgical History  Procedure Laterality Date  . Arthrodesis  07/06/2002    of left long finger distal interphalangeal joint with Kirschner wire fixation X 3  . Coronary artery bypass graft  10/17/2000    Troy Combs. Troy Snare, MD  . Atrial ablation surgery  05/04/2001    Dr. Cristopher Combs  . Coronary angioplasty  1993  . Hernia repair      BILATERAL  . Cholecystectomy    . Exploratory laparotomy      AFTER LIVER TRAUMA AND HAND SURGERY  . Cardiac catheterization  09/05/2008    Revealing 4 of 4 patent grafts with native multivessel coronary artery disease, EF  of 60% without regional wall motion abnormalities.    There were no vitals filed for this  visit.  Visit Diagnosis:  Trigger point with neck pain  Unsteadiness on feet      Subjective Assessment - 03/11/15 0718    Subjective Pt states vertigo is no longer a problem but reports excruciating pain started a couple of nights ago in L upper trap "feels like a knot" had to take an oxycodone   Patient is accompained by: Family member   Patient Stated Goals resolve the vertigo; new goal is to resolve L shoulder pain   Currently in Pain? Yes   Pain Score 7    Pain Location Shoulder   Pain Orientation Upper;Left  L upper trap   Pain Descriptors / Indicators Throbbing;Tightness;Grimacing;Spasm;Pressure;Radiating;Discomfort   Pain Type Acute pain   Pain Onset In the past 7 days   Pain Frequency Constant   Aggravating Factors  unknown - possibly slept on it wrong   Pain Relieving Factors heat and pain medication   Multiple Pain Sites No                         [facility] Adult PT Treatment/Exercise - 03/11/15 0001    Ultrasound   Ultrasound Location L upper trap region   Ultrasound Parameters 1.5 w/cm2 pulsed 50%   Ultrasound Goals Pain   Manual Therapy   Manual Therapy Soft tissue mobilization;Muscle Energy Technique   Manual therapy comments  pain reported with deep pressure and palpation   Muscle Energy Technique Jones strain/counterstarin technique to trigger point in L upper trap       TherEx; cervical AROM and stretches for flexion, extension, lateral flexion and rotation for R and L sides  Mulligan's stretch with use of towel for gentle overpressure with R and L cervical rotation - 30 sec hold x 2 reps to each side  Self Care; instructed in cervical AROM and stretches - added to HEP to assist with pain management        PT Education - 03/11/15 0729    Education provided Yes   Education Details also instructed in Mulligan's stretch with use of towel for cervical rotation   Person(s) Educated Patient;Spouse   Methods Explanation;Demonstration;Handout    Comprehension Verbalized understanding;Returned demonstration             PT Long Term Goals - 02/19/15 2205    PT LONG TERM GOAL #1   Title Pt will have a negative R horizontal roll test to indicate that R BPPV horizontal canal has resolved.  (03-18-15)   Time 4   Period Weeks   Status New   PT LONG TERM GOAL #2   Title Report no vertigo with bed mobility.  (03-18-15)   Time 4   Period Weeks   Status New   PT LONG TERM GOAL #3   Title Independent in HEP for habituation prn.  (03-18-15)   Time 4   Period Weeks   Status New               Plan - 03/11/15 0732    Clinical Impression Statement Vertigo remains resolved but pt now has significant tender trigger point in L upper trap resulting in pain with movement   Pt will benefit from skilled therapeutic intervention in order to improve on the following deficits Dizziness;Decreased balance;Decreased mobility;Difficulty walking   Rehab Potential Good   PT Frequency 2x / week   PT Duration 4 weeks   PT Treatment/Interventions ADLs/Self Care Home Management;Canalith Repostioning;Gait training;Therapeutic activities;Balance training;Neuromuscular re-education;Patient/family education;Vestibular;Ultrasound;Manual techniques   PT Next Visit Plan continue exercise for balance; assess cervical pain - try TENS if pain continues   PT Home Exercise Plan back exercises and balance on foam   Consulted and Agree with Plan of Care Patient;Family member/caregiver   Family Member Consulted wife        Problem List Patient Active Problem List   Diagnosis Date Noted  . Peripheral polyneuropathy ([place]) 03/06/2015  . BPPV (benign paroxysmal positional vertigo) 03/06/2015  . Headache 01/18/2015  . Vertigo 01/18/2015  . Allergic rhinitis 09/20/2014  . DOE (dyspnea on exertion) 08/10/2014  . Severe dizziness 08/10/2014  . Acute diverticulitis 05/18/2014  . Diarrhea 05/18/2014  . LLQ pain 05/13/2014  . Chronic meniscal tear of  knee 09/22/2013  . Primary localized osteoarthrosis, lower leg 09/22/2013  . Neck pain, bilateral 12/18/2012  . Headache(784.0) 12/18/2012  . Vision changes 12/18/2012  . Preventative health care 12/02/2012  . Impaired glucose tolerance 12/02/2012  . Low back pain 12/02/2012  . Palpitations 10/09/2010  . B12 deficiency 08/30/2010  . Hearing loss 08/30/2010  . Tinnitus 08/30/2010  . Orthostatic hypotension 06/28/2009  . HYPERLIPIDEMIA TYPE IIB / III 05/15/2009  . CHEST PAIN UNSPECIFIED 11/23/2008  . Hypotension, unspecified 10/12/2008  . DIZZINESS 10/12/2008  . Shortness of breath 09/14/2008  . UNSPECIFIED ANEMIA 09/13/2008  . ANXIETY DEPRESSION 09/13/2008  . Old myocardial infarction 09/13/2008  . GASTROESOPHAGEAL REFLUX  DISEASE, HX OF 09/13/2008  . LUMBAR RADICULOPATHY, LEFT 08/23/2008  . MELANOMA, SHOULDER 11/27/2006  . Essential hypertension 11/27/2006  . Coronary atherosclerosis 11/27/2006  . OBSTRUCTION, BLADDER NECK 11/27/2006  . ERECTILE DYSFUNCTION, ORGANIC 11/27/2006    Alda Lea, PT 03/11/2015, 7:37 AM  [facility] [address] [place], [place], [address] Phone: [phone]   Fax:  [phone]  Name: Troy Combs MRN: [id number] Date of Birth: [birth year]

## 2015-03-11 NOTE — Patient Instructions (Signed)
Lateral Flexion    With head in comfortable, centered position and chin slightly tucked, gently bring right ear toward right shoulder. Hold _30___ seconds. Repeat with left side. Repeat __2__ times. Do __3-5__ sessions per day.  http://gt2.exer.us/8   Copyright  VHI. All rights reserved.

## 2015-03-12 ENCOUNTER — Encounter: Payer: Self-pay | Admitting: Physical Therapy

## 2015-03-12 NOTE — Therapy (Signed)
[facility] [address] [place], [place], [address] Phone: [phone]   Fax:  [phone]  Physical Therapy Treatment  Patient Details  Name: Troy Combs MRN: [id number] Date of Birth: Jun 22, 1931 Referring Provider: Dr. Unice Cobble  Encounter Date: 03/09/2015      PT End of Session - 03/12/15 2045    Visit Number 6   Number of Visits 6   Date for PT Re-Evaluation 03/18/15   Authorization Type [facility] Medicare   Authorization Time Period 02-15-15 - 04-29-15   PT Start Time 0850   PT Stop Time 0931   PT Time Calculation (min) 41 min      Past Medical History  Diagnosis Date  . Old myocardial infarction   . Unspecified essential hypertension   . Personal history of other diseases of digestive system   . Personal history of malignant melanoma of skin   . Dysthymic disorder   . Anemia, unspecified   . Thoracic or lumbosacral neuritis or radiculitis, unspecified   . Impotence of organic origin   . Malignant melanoma of skin of upper limb, including shoulder ([place])   . Bladder neck obstruction   . Coronary atherosclerosis of unspecified type of vessel, native or graft   . Impaired glucose tolerance 12/02/2012  . Allergic rhinitis 09/20/2014    Past Surgical History  Procedure Laterality Date  . Arthrodesis  07/06/2002    of left long finger distal interphalangeal joint with Kirschner wire fixation X 3  . Coronary artery bypass graft  10/17/2000    [name]. [name], MD  . Atrial ablation surgery  05/04/2001    Dr. [name]  . Coronary angioplasty  1993  . Hernia repair      BILATERAL  . Cholecystectomy    . Exploratory laparotomy      AFTER LIVER TRAUMA AND HAND SURGERY  . Cardiac catheterization  09/05/2008    Revealing 4 of 4 patent grafts with native multivessel coronary artery disease, EF  of 60% without regional wall motion abnormalities.    There were no vitals filed for this  visit.  Visit Diagnosis:  Trigger point with neck pain      Subjective Assessment - 03/12/15 2040    Subjective Pt reports L shoulder felt better after treatment until late that evening and then started hurting again   Patient Stated Goals resolve the vertigo; new goal is to resolve L shoulder pain   Currently in Pain? Yes   Pain Score 5    Pain Location Shoulder   Pain Orientation Upper;Left   Pain Descriptors / Indicators Spasm;Tightness;Discomfort;Grimacing;Radiating   Pain Type Acute pain   Pain Radiating Towards elbow (at times - not constantly radiating)   Pain Onset In the past 7 days   Pain Frequency Intermittent                         [facility] Adult PT Treatment/Exercise - 03/12/15 0001    Modalities   Modalities Moist Heat   Moist Heat Therapy   Number Minutes Moist Heat 25 Minutes   Moist Heat Location Cervical   Electrical Stimulation   Electrical Stimulation Location L upper trap region and posterior cerivcal region   Electrical Stimulation Parameters 8.5 for channels 1 and 2   Electrical Stimulation Goals Pain                     PT Long Term Goals - 02/19/15  [place] #1   Title Pt will have a negative R horizontal roll test to indicate that R BPPV horizontal canal has resolved.  (03-18-15)   Time 4   Period Weeks   Status New   PT LONG TERM GOAL #2   Title Report no vertigo with bed mobility.  (03-18-15)   Time 4   Period Weeks   Status New   PT LONG TERM GOAL #3   Title Independent in HEP for habituation prn.  (03-18-15)   Time 4   Period Weeks   Status New               Plan - 03/12/15 2045    Clinical Impression Statement Tigger point in L shoulder appears to be less painful than it was at first session this week   Pt will benefit from skilled therapeutic intervention in order to improve on the following deficits Dizziness;Decreased balance;Decreased mobility;Difficulty walking   Rehab  Potential Good   PT Frequency 2x / week   PT Duration 4 weeks   PT Treatment/Interventions ADLs/Self Care Home Management;Canalith Repostioning;Gait training;Therapeutic activities;Balance training;Neuromuscular re-education;Patient/family education;Vestibular;Ultrasound;Manual techniques;Electrical Stimulation   PT Next Visit Plan continue exercise for balance; assess cervical pain - try TENS if pain continues   PT Home Exercise Plan back exercises and balance on foam   Consulted and Agree with Plan of Care Patient        Problem List Patient Active Problem List   Diagnosis Date Noted  . Peripheral polyneuropathy ([place]) 03/06/2015  . BPPV (benign paroxysmal positional vertigo) 03/06/2015  . Headache 01/18/2015  . Vertigo 01/18/2015  . Allergic rhinitis 09/20/2014  . DOE (dyspnea on exertion) 08/10/2014  . Severe dizziness 08/10/2014  . Acute diverticulitis 05/18/2014  . Diarrhea 05/18/2014  . LLQ pain 05/13/2014  . Chronic meniscal tear of knee 09/22/2013  . Primary localized osteoarthrosis, lower leg 09/22/2013  . Neck pain, bilateral 12/18/2012  . Headache(784.0) 12/18/2012  . Vision changes 12/18/2012  . Preventative health care 12/02/2012  . Impaired glucose tolerance 12/02/2012  . Low back pain 12/02/2012  . Palpitations 10/09/2010  . B12 deficiency 08/30/2010  . Hearing loss 08/30/2010  . Tinnitus 08/30/2010  . Orthostatic hypotension 06/28/2009  . HYPERLIPIDEMIA TYPE IIB / III 05/15/2009  . CHEST PAIN UNSPECIFIED 11/23/2008  . Hypotension, unspecified 10/12/2008  . DIZZINESS 10/12/2008  . Shortness of breath 09/14/2008  . UNSPECIFIED ANEMIA 09/13/2008  . ANXIETY DEPRESSION 09/13/2008  . Old myocardial infarction 09/13/2008  . GASTROESOPHAGEAL REFLUX DISEASE, HX OF 09/13/2008  . LUMBAR RADICULOPATHY, LEFT 08/23/2008  . MELANOMA, SHOULDER 11/27/2006  . Essential hypertension 11/27/2006  . Coronary atherosclerosis 11/27/2006  . OBSTRUCTION, BLADDER NECK  11/27/2006  . ERECTILE DYSFUNCTION, ORGANIC 11/27/2006    Alda Lea, PT  03/12/2015, 8:49 PM  [facility] [address] [place], [place], [address] Phone: [phone]   Fax:  [phone]  Name: Troy Combs MRN: [id number] Date of Birth: [birth year]

## 2015-03-13 ENCOUNTER — Ambulatory Visit: Payer: Medicare Other | Admitting: Physical Therapy

## 2015-03-13 ENCOUNTER — Encounter: Payer: Self-pay | Admitting: Physical Therapy

## 2015-03-13 DIAGNOSIS — R2681 Unsteadiness on feet: Secondary | ICD-10-CM

## 2015-03-13 DIAGNOSIS — R269 Unspecified abnormalities of gait and mobility: Secondary | ICD-10-CM

## 2015-03-13 DIAGNOSIS — M542 Cervicalgia: Secondary | ICD-10-CM | POA: Diagnosis not present

## 2015-03-13 NOTE — Therapy (Signed)
[facility] [address] [place], [place], [address] Phone: [phone]   Fax:  [phone]  Physical Therapy Treatment  Patient Details  Name: Troy Combs MRN: [id number] Date of Birth: 12-27-1931 Referring Provider: Dr. Unice Cobble  Encounter Date: 03/13/2015      PT End of Session - 03/13/15 1611    Visit Number 7   Number of Visits 12   Date for PT Re-Evaluation 03/18/15   Authorization Type UHC Medicare   PT Start Time 0923   PT Stop Time 1355   PT Time Calculation (min) 40 min      Past Medical History  Diagnosis Date  . Old myocardial infarction   . Unspecified essential hypertension   . Personal history of other diseases of digestive system   . Personal history of malignant melanoma of skin   . Dysthymic disorder   . Anemia, unspecified   . Thoracic or lumbosacral neuritis or radiculitis, unspecified   . Impotence of organic origin   . Malignant melanoma of skin of upper limb, including shoulder ([place])   . Bladder neck obstruction   . Coronary atherosclerosis of unspecified type of vessel, native or graft   . Impaired glucose tolerance 12/02/2012  . Allergic rhinitis 09/20/2014    Past Surgical History  Procedure Laterality Date  . Arthrodesis  07/06/2002    of left long finger distal interphalangeal joint with Kirschner wire fixation X 3  . Coronary artery bypass graft  10/17/2000    [name]. [name], MD  . Atrial ablation surgery  05/04/2001    Dr. [name]  . Coronary angioplasty  1993  . Hernia repair      BILATERAL  . Cholecystectomy    . Exploratory laparotomy      AFTER LIVER TRAUMA AND HAND SURGERY  . Cardiac catheterization  09/05/2008    Revealing 4 of 4 patent grafts with native multivessel coronary artery disease, EF  of 60% without regional wall motion abnormalities.    There were no vitals filed for this visit.  Visit Diagnosis:  Abnormality of gait - Plan: PT  plan of care cert/re-cert  Unsteadiness on feet - Plan: PT plan of care cert/re-cert      Subjective Assessment - 03/13/15 1603    Subjective Pt reports really no pain now in L shoulder - "feels so much better" but states that his balance is now the problem - does not feel steady walking   Patient Stated Goals resolve the vertigo; new goal is to resolve L shoulder pain   Currently in Pain? No/denies                              Balance Exercises - 03/13/15 1605    Balance Exercises: Standing   Standing Eyes Opened Narrow base of support (BOS);Wide (BOA);Head turns;Foam/compliant surface;5 reps   Standing Eyes Closed Narrow base of support (BOS);Wide (BOA);Head turns;Foam/compliant surface;5 reps   Tandem Stance Eyes open;1 rep;30 secs   SLS Eyes open;Solid surface;10 secs  RLE and LLE   Step Ups Forward;6 inch;Intermittent UE support  10 reps each leg   Gait with Head Turns Forward;1 rep  100'   Other Standing Exercises Pt. performed alternating forward, back and side kicks x 10 reps each; marching in place  forward 15' and then backward 15' with UE uspport prn iwth CGA; crossovers and braiding along countertop with UE support  partial tandem stance x 30 secs each position; single limb stance x 10 secs each leg with UE support prn with CGA; Pt performed marching on incline - with head turns side to side x 10 reps with CGA to min assist; pt reported increased Back pain with standing balance exercises - required seated rest break for approx. 2"       PT Education - 03/13/15 1608    Education provided Yes   Education Details reviewed balance on foam exericses - wtih EO and EC and with head turns   Person(s) Educated Patient   Methods Explanation;Demonstration  handout previously given   Comprehension Verbalized understanding;Returned demonstration             PT Long Term Goals - 03/13/15 1618    PT LONG TERM GOAL #1   Title Pt will have a  negative R horizontal roll test to indicate that R BPPV horizontal canal has resolved.  (03-18-15)   Time 4   Period Weeks   Status Achieved   PT LONG TERM GOAL #2   Title Report no vertigo with bed mobility.  (03-18-15)   Baseline met 03-13-15   Time 4   Period Weeks   Status Achieved   PT LONG TERM GOAL #3   Title Independent in HEP for habituation prn.  (03-18-15)   Baseline met 03-13-15   Time 4   Period Weeks   Status Achieved   PT LONG TERM GOAL #4   Title Perform Sensory Organization Test and establish LTG as appropriate. (04-12-15)   Time 4   Period Weeks   Status New   PT LONG TERM GOAL #5   Title Pt will subjectively report at least 25% improvement in balance/steadiness with ambulation.  (04-12-15)   Time 4   Period Weeks   Status New   Additional Long Term Goals   Additional Long Term Goals Yes   PT LONG TERM GOAL #6   Title Pt will be independent in updated HEP to include balance exercises.  (04-12-15)   Time 4   Period Weeks   Status New               Plan - 03/13/15 1613    Clinical Impression Statement Trigger point in L shoulder appears to be resolved; pt reports no vertigo at this time - only unsteadiness with walking due to balance deficits which appear to be due to vestibular deficits   Pt will benefit from skilled therapeutic intervention in order to improve on the following deficits Dizziness;Decreased balance;Decreased mobility;Difficulty walking   Rehab Potential Good   PT Frequency 2x / week   PT Duration 4 weeks   PT Treatment/Interventions ADLs/Self Care Home Management;Canalith Repostioning;Gait training;Therapeutic activities;Balance training;Neuromuscular re-education;Patient/family education;Vestibular;Ultrasound;Manual techniques;Electrical Stimulation   PT Next Visit Plan do SOT; give balance exercises   PT Home Exercise Plan back exercises and balance on foam   Consulted and Agree with Plan of Care Patient        Problem  List Patient Active Problem List   Diagnosis Date Noted  . Peripheral polyneuropathy ([place]) 03/06/2015  . BPPV (benign paroxysmal positional vertigo) 03/06/2015  . Headache 01/18/2015  . Vertigo 01/18/2015  . Allergic rhinitis 09/20/2014  . DOE (dyspnea on exertion) 08/10/2014  . Severe dizziness 08/10/2014  . Acute diverticulitis 05/18/2014  . Diarrhea 05/18/2014  . LLQ pain 05/13/2014  . Chronic meniscal tear of knee 09/22/2013  . Primary localized osteoarthrosis, lower leg 09/22/2013  . Neck pain, bilateral  12/18/2012  . Headache(784.0) 12/18/2012  . Vision changes 12/18/2012  . Preventative health care 12/02/2012  . Impaired glucose tolerance 12/02/2012  . Low back pain 12/02/2012  . Palpitations 10/09/2010  . B12 deficiency 08/30/2010  . Hearing loss 08/30/2010  . Tinnitus 08/30/2010  . Orthostatic hypotension 06/28/2009  . HYPERLIPIDEMIA TYPE IIB / III 05/15/2009  . CHEST PAIN UNSPECIFIED 11/23/2008  . Hypotension, unspecified 10/12/2008  . DIZZINESS 10/12/2008  . Shortness of breath 09/14/2008  . UNSPECIFIED ANEMIA 09/13/2008  . ANXIETY DEPRESSION 09/13/2008  . Old myocardial infarction 09/13/2008  . GASTROESOPHAGEAL REFLUX DISEASE, HX OF 09/13/2008  . LUMBAR RADICULOPATHY, LEFT 08/23/2008  . MELANOMA, SHOULDER 11/27/2006  . Essential hypertension 11/27/2006  . Coronary atherosclerosis 11/27/2006  . OBSTRUCTION, BLADDER NECK 11/27/2006  . ERECTILE DYSFUNCTION, ORGANIC 11/27/2006    Alda Lea, PT 03/13/2015, 4:32 PM  [facility] [address] [place], [place], [address] Phone: [phone]   Fax:  [phone]  Name: Troy Combs MRN: [id number] Date of Birth: 02-08-1932

## 2015-03-16 ENCOUNTER — Ambulatory Visit: Payer: Medicare Other | Admitting: Physical Therapy

## 2015-03-16 DIAGNOSIS — M542 Cervicalgia: Secondary | ICD-10-CM | POA: Diagnosis not present

## 2015-03-16 DIAGNOSIS — R269 Unspecified abnormalities of gait and mobility: Secondary | ICD-10-CM

## 2015-03-16 DIAGNOSIS — R2681 Unsteadiness on feet: Secondary | ICD-10-CM

## 2015-03-18 ENCOUNTER — Encounter: Payer: Self-pay | Admitting: Physical Therapy

## 2015-03-18 NOTE — Therapy (Signed)
[facility] [address] [place], [place], [address] Phone: [phone]   Fax:  [phone]  Physical Therapy Treatment  Patient Details  Name: Troy Combs MRN: [id number] Date of Birth: [birth year] Referring Provider: Dr. Unice Cobble  Encounter Date: 03/16/2015      PT End of Session - 03/18/15 1618    Visit Number 8   Number of Visits 12   Date for PT Re-Evaluation 03/18/15   Authorization Type UHC Medicare   Authorization Time Period 02-15-15 - 04-29-15   PT Start Time 9470   PT Stop Time 0931   PT Time Calculation (min) 44 min      Past Medical History  Diagnosis Date  . Old myocardial infarction   . Unspecified essential hypertension   . Personal history of other diseases of digestive system   . Personal history of malignant melanoma of skin   . Dysthymic disorder   . Anemia, unspecified   . Thoracic or lumbosacral neuritis or radiculitis, unspecified   . Impotence of organic origin   . Malignant melanoma of skin of upper limb, including shoulder ([place])   . Bladder neck obstruction   . Coronary atherosclerosis of unspecified type of vessel, native or graft   . Impaired glucose tolerance 12/02/2012  . Allergic rhinitis 09/20/2014    Past Surgical History  Procedure Laterality Date  . Arthrodesis  07/06/2002    of left long finger distal interphalangeal joint with Kirschner wire fixation X 3  . Coronary artery bypass graft  10/17/2000    [name]. [name], MD  . Atrial ablation surgery  05/04/2001    Dr. [name]  . Coronary angioplasty  1993  . Hernia repair      BILATERAL  . Cholecystectomy    . Exploratory laparotomy      AFTER LIVER TRAUMA AND HAND SURGERY  . Cardiac catheterization  09/05/2008    Revealing 4 of 4 patent grafts with native multivessel coronary artery disease, EF  of 60% without regional wall motion abnormalities.    There were no vitals filed for this  visit.  Visit Diagnosis:  Unsteadiness on feet  Abnormality of gait      Subjective Assessment - 03/18/15 1606    Subjective Pt reports no new problems - no pain in L shoulder and no vertigo - "just my balance now that is the problem"   Patient Stated Goals resolve the vertigo; new goal is to resolve L shoulder pain   Currently in Pain? No/denies                              Balance Exercises - 03/18/15 1614    Balance Exercises: Standing   Tandem Stance Eyes open;2 reps;30 secs   SLS Eyes open;Solid surface;Intermittent upper extremity support;10 secs   Rockerboard Anterior/posterior;30 seconds;EO   Sidestepping 2 reps  inside bars     Pt performed forward, back and side kicks x 10 reps; marching in place 10', then marching forward and backward inside bars without UE support; Amb. 10' forward on tiptoes and then on heels for ankle musculature strengthening and for improved balance - without UE support  Braiding 10' x 4 with cues for incr. Balance - with UE support prn inside bars      PT Education - 03/18/15 1618    Education provided Yes   Education Details balance exercises   Person(s) Educated Patient  Methods Explanation;Demonstration;Handout   Comprehension Verbalized understanding;Returned demonstration             PT Long Term Goals - 03/13/15 1618    PT LONG TERM GOAL #1   Title Pt will have a negative R horizontal roll test to indicate that R BPPV horizontal canal has resolved.  (03-18-15)   Time 4   Period Weeks   Status Achieved   PT LONG TERM GOAL #2   Title Report no vertigo with bed mobility.  (03-18-15)   Baseline met 03-13-15   Time 4   Period Weeks   Status Achieved   PT LONG TERM GOAL #3   Title Independent in HEP for habituation prn.  (03-18-15)   Baseline met 03-13-15   Time 4   Period Weeks   Status Achieved   PT LONG TERM GOAL #4   Title Perform Sensory Organization Test and establish LTG as appropriate.  (04-12-15)   Time 4   Period Weeks   Status New   PT LONG TERM GOAL #5   Title Pt will subjectively report at least 25% improvement in balance/steadiness with ambulation.  (04-12-15)   Time 4   Period Weeks   Status New   Additional Long Term Goals   Additional Long Term Goals Yes   PT LONG TERM GOAL #6   Title Pt will be independent in updated HEP to include balance exercises.  (04-12-15)   Time 4   Period Weeks   Status New               Plan - 03/18/15 1618    Clinical Impression Statement Pt has decreased high level balance skills - specifically decr. tandem and single limb stance; basic balance skills are WNL's   Pt will benefit from skilled therapeutic intervention in order to improve on the following deficits Dizziness;Decreased balance;Decreased mobility;Difficulty walking   Rehab Potential Good   PT Frequency 2x / week   PT Duration 4 weeks   PT Treatment/Interventions ADLs/Self Care Home Management;Canalith Repostioning;Gait training;Therapeutic activities;Balance training;Neuromuscular re-education;Patient/family education;Vestibular;Ultrasound;Manual techniques;Electrical Stimulation   PT Next Visit Plan check balance HEP   PT Home Exercise Plan balance exercises   Consulted and Agree with Plan of Care Patient        Problem List Patient Active Problem List   Diagnosis Date Noted  . Peripheral polyneuropathy ([place]) 03/06/2015  . BPPV (benign paroxysmal positional vertigo) 03/06/2015  . Headache 01/18/2015  . Vertigo 01/18/2015  . Allergic rhinitis 09/20/2014  . DOE (dyspnea on exertion) 08/10/2014  . Severe dizziness 08/10/2014  . Acute diverticulitis 05/18/2014  . Diarrhea 05/18/2014  . LLQ pain 05/13/2014  . Chronic meniscal tear of knee 09/22/2013  . Primary localized osteoarthrosis, lower leg 09/22/2013  . Neck pain, bilateral 12/18/2012  . Headache(784.0) 12/18/2012  . Vision changes 12/18/2012  . Preventative health care 12/02/2012  .  Impaired glucose tolerance 12/02/2012  . Low back pain 12/02/2012  . Palpitations 10/09/2010  . B12 deficiency 08/30/2010  . Hearing loss 08/30/2010  . Tinnitus 08/30/2010  . Orthostatic hypotension 06/28/2009  . HYPERLIPIDEMIA TYPE IIB / III 05/15/2009  . CHEST PAIN UNSPECIFIED 11/23/2008  . Hypotension, unspecified 10/12/2008  . DIZZINESS 10/12/2008  . Shortness of breath 09/14/2008  . UNSPECIFIED ANEMIA 09/13/2008  . ANXIETY DEPRESSION 09/13/2008  . Old myocardial infarction 09/13/2008  . GASTROESOPHAGEAL REFLUX DISEASE, HX OF 09/13/2008  . LUMBAR RADICULOPATHY, LEFT 08/23/2008  . MELANOMA, SHOULDER 11/27/2006  . Essential hypertension 11/27/2006  . Coronary atherosclerosis  11/27/2006  . OBSTRUCTION, BLADDER NECK 11/27/2006  . ERECTILE DYSFUNCTION, ORGANIC 11/27/2006    Alda Lea, PT 03/18/2015, 4:25 PM  [facility] [address] [place], [place], [address] Phone: [phone]   Fax:  [phone]  Name: Troy Combs MRN: [id number] Date of Birth: 09/05/1931

## 2015-03-18 NOTE — Patient Instructions (Signed)
Pt given balance exercise packet

## 2015-03-20 ENCOUNTER — Encounter: Payer: Self-pay | Admitting: Physical Therapy

## 2015-03-20 ENCOUNTER — Ambulatory Visit: Payer: Medicare Other | Admitting: Physical Therapy

## 2015-03-20 DIAGNOSIS — M542 Cervicalgia: Secondary | ICD-10-CM | POA: Diagnosis not present

## 2015-03-20 DIAGNOSIS — R2681 Unsteadiness on feet: Secondary | ICD-10-CM

## 2015-03-20 DIAGNOSIS — R269 Unspecified abnormalities of gait and mobility: Secondary | ICD-10-CM

## 2015-03-20 NOTE — Therapy (Signed)
[facility] [address] [place], [place], [address] Phone: [phone]   Fax:  [phone]  Physical Therapy Treatment  Patient Details  Name: Troy Combs MRN: [id number] Date of Birth: Apr 29, 1932 Referring Provider: Dr. Unice Cobble  Encounter Date: 03/20/2015      PT End of Session - 03/20/15 1726    Visit Number 9   Number of Visits 12   Date for PT Re-Evaluation 03/18/15   Authorization Type UHC Medicare   Authorization Time Period 02-15-15 - 04-29-15   PT Start Time 0930   PT Stop Time 1015   PT Time Calculation (min) 45 min      Past Medical History  Diagnosis Date  . Old myocardial infarction   . Unspecified essential hypertension   . Personal history of other diseases of digestive system   . Personal history of malignant melanoma of skin   . Dysthymic disorder   . Anemia, unspecified   . Thoracic or lumbosacral neuritis or radiculitis, unspecified   . Impotence of organic origin   . Malignant melanoma of skin of upper limb, including shoulder ([place])   . Bladder neck obstruction   . Coronary atherosclerosis of unspecified type of vessel, native or graft   . Impaired glucose tolerance 12/02/2012  . Allergic rhinitis 09/20/2014    Past Surgical History  Procedure Laterality Date  . Arthrodesis  07/06/2002    of left long finger distal interphalangeal joint with Kirschner wire fixation X 3  . Coronary artery bypass graft  10/17/2000    [name]. [name], MD  . Atrial ablation surgery  05/04/2001    Dr. [name]  . Coronary angioplasty  1993  . Hernia repair      BILATERAL  . Cholecystectomy    . Exploratory laparotomy      AFTER LIVER TRAUMA AND HAND SURGERY  . Cardiac catheterization  09/05/2008    Revealing 4 of 4 patent grafts with native multivessel coronary artery disease, EF  of 60% without regional wall motion abnormalities.    There were no vitals filed for this  visit.  Visit Diagnosis:  Unsteadiness on feet  Abnormality of gait      Subjective Assessment - 03/20/15 1717    Subjective Pt. reports he had a busy weekend - hasn't had time to do alot of exercises at home - "we had our 40th wedding anniversary party"   Patient Stated Goals resolve the vertigo; new goal is to resolve L shoulder pain; improve balance   Currently in Pain? No/denies                              Balance Exercises - 03/20/15 1722    Balance Exercises: Standing   Standing Eyes Opened Narrow base of support (BOS);Wide (BOA);Head turns;Foam/compliant surface;5 reps   Standing Eyes Closed Narrow base of support (BOS);Wide (BOA);Head turns;Foam/compliant surface;5 reps   SLS Eyes open;Solid surface;Intermittent upper extremity support;10 secs   Rockerboard Anterior/posterior;30 seconds;EO;Lateral;Head turns;Intermittent UE support   Gait with Head Turns Forward;1 rep  120' around track   Sidestepping 2 reps  with crossovers for braiding   Other Standing Exercises standing on Bosu - with UE support - moving each LE up/back and laterally for improved SLS     Amb. 120' with horizontal head turns around track - with CGA - and with cues to stand erect and incr. Arm swing  PT Long Term Goals - 03/13/15 1618    PT LONG TERM GOAL #1   Title Pt will have a negative R horizontal roll test to indicate that R BPPV horizontal canal has resolved.  (03-18-15)   Time 4   Period Weeks   Status Achieved   PT LONG TERM GOAL #2   Title Report no vertigo with bed mobility.  (03-18-15)   Baseline met 03-13-15   Time 4   Period Weeks   Status Achieved   PT LONG TERM GOAL #3   Title Independent in HEP for habituation prn.  (03-18-15)   Baseline met 03-13-15   Time 4   Period Weeks   Status Achieved   PT LONG TERM GOAL #4   Title Perform Sensory Organization Test and establish LTG as appropriate. (04-12-15)   Time 4   Period Weeks   Status  New   PT LONG TERM GOAL #5   Title Pt will subjectively report at least 25% improvement in balance/steadiness with ambulation.  (04-12-15)   Time 4   Period Weeks   Status New   Additional Long Term Goals   Additional Long Term Goals Yes   PT LONG TERM GOAL #6   Title Pt will be independent in updated HEP to include balance exercises.  (04-12-15)   Time 4   Period Weeks   Status New               Plan - 03/20/15 1727    Clinical Impression Statement Pt. c/o some nausea at end of session - after doing activities incorporating head turns - appears to be due to decr. vestibular function; pt continues to have decr. single limb stance   Pt will benefit from skilled therapeutic intervention in order to improve on the following deficits Dizziness;Decreased balance;Decreased mobility;Difficulty walking   Rehab Potential Good   PT Frequency 2x / week   PT Duration 4 weeks   PT Treatment/Interventions ADLs/Self Care Home Management;Canalith Repostioning;Gait training;Therapeutic activities;Balance training;Neuromuscular re-education;Patient/family education;Vestibular;Ultrasound;Manual techniques;Electrical Stimulation   PT Next Visit Plan cont with dynamic balance exercises   Consulted and Agree with Plan of Care Patient        Problem List Patient Active Problem List   Diagnosis Date Noted  . Peripheral polyneuropathy ([place]) 03/06/2015  . BPPV (benign paroxysmal positional vertigo) 03/06/2015  . Headache 01/18/2015  . Vertigo 01/18/2015  . Allergic rhinitis 09/20/2014  . DOE (dyspnea on exertion) 08/10/2014  . Severe dizziness 08/10/2014  . Acute diverticulitis 05/18/2014  . Diarrhea 05/18/2014  . LLQ pain 05/13/2014  . Chronic meniscal tear of knee 09/22/2013  . Primary localized osteoarthrosis, lower leg 09/22/2013  . Neck pain, bilateral 12/18/2012  . Headache(784.0) 12/18/2012  . Vision changes 12/18/2012  . Preventative health care 12/02/2012  . Impaired glucose  tolerance 12/02/2012  . Low back pain 12/02/2012  . Palpitations 10/09/2010  . B12 deficiency 08/30/2010  . Hearing loss 08/30/2010  . Tinnitus 08/30/2010  . Orthostatic hypotension 06/28/2009  . HYPERLIPIDEMIA TYPE IIB / III 05/15/2009  . CHEST PAIN UNSPECIFIED 11/23/2008  . Hypotension, unspecified 10/12/2008  . DIZZINESS 10/12/2008  . Shortness of breath 09/14/2008  . UNSPECIFIED ANEMIA 09/13/2008  . ANXIETY DEPRESSION 09/13/2008  . Old myocardial infarction 09/13/2008  . GASTROESOPHAGEAL REFLUX DISEASE, HX OF 09/13/2008  . LUMBAR RADICULOPATHY, LEFT 08/23/2008  . MELANOMA, SHOULDER 11/27/2006  . Essential hypertension 11/27/2006  . Coronary atherosclerosis 11/27/2006  . OBSTRUCTION, BLADDER NECK 11/27/2006  . ERECTILE DYSFUNCTION, ORGANIC 11/27/2006  Alda Lea, PT 03/20/2015, 5:30 PM  [facility] [address] [place], [place], [address] Phone: [phone]   Fax:  [phone]  Name: Troy Combs MRN: [id number] Date of Birth: 11-07-1931

## 2015-03-21 ENCOUNTER — Ambulatory Visit: Payer: Medicare Other | Admitting: Physical Therapy

## 2015-03-21 DIAGNOSIS — M542 Cervicalgia: Secondary | ICD-10-CM | POA: Diagnosis not present

## 2015-03-21 DIAGNOSIS — R269 Unspecified abnormalities of gait and mobility: Secondary | ICD-10-CM

## 2015-03-21 DIAGNOSIS — R2681 Unsteadiness on feet: Secondary | ICD-10-CM

## 2015-03-26 ENCOUNTER — Encounter: Payer: Self-pay | Admitting: Physical Therapy

## 2015-03-26 NOTE — Therapy (Signed)
[facility] [address] [place], [place], [address] Phone: [phone]   Fax:  [phone]  Physical Therapy Treatment  Patient Details  Name: Troy Combs MRN: [id number] Date of Birth: May 13, 1931 Referring Provider: Dr. Unice Cobble  Encounter Date: 03/21/2015      PT End of Session - 03/26/15 2038    Visit Number 10   Number of Visits 12   Date for PT Re-Evaluation 03/18/15   Authorization Type [employer] Medicare   Authorization Time Period 02-15-15 - 04-29-15   PT Start Time 0930   PT Stop Time 1014   PT Time Calculation (min) 44 min      Past Medical History  Diagnosis Date  . Old myocardial infarction   . Unspecified essential hypertension   . Personal history of other diseases of digestive system   . Personal history of malignant melanoma of skin   . Dysthymic disorder   . Anemia, unspecified   . Thoracic or lumbosacral neuritis or radiculitis, unspecified   . Impotence of organic origin   . Malignant melanoma of skin of upper limb, including shoulder ([place])   . Bladder neck obstruction   . Coronary atherosclerosis of unspecified type of vessel, native or graft   . Impaired glucose tolerance 12/02/2012  . Allergic rhinitis 09/20/2014    Past Surgical History  Procedure Laterality Date  . Arthrodesis  07/06/2002    of left long finger distal interphalangeal joint with Kirschner wire fixation X 3  . Coronary artery bypass graft  10/17/2000    [name]. [name], MD  . Atrial ablation surgery  05/04/2001    Dr. [name]  . Coronary angioplasty  1993  . Hernia repair      BILATERAL  . Cholecystectomy    . Exploratory laparotomy      AFTER LIVER TRAUMA AND HAND SURGERY  . Cardiac catheterization  09/05/2008    Revealing 4 of 4 patent grafts with native multivessel coronary artery disease, EF  of 60% without regional wall motion abnormalities.    There were no vitals filed for this  visit.  Visit Diagnosis:  Unsteadiness on feet  Abnormality of gait      Subjective Assessment - 03/26/15 2032    Subjective Pt reports he is feeling better today - felt nauseaus after treatment yesterday   Patient is accompained by: Family member   Patient Stated Goals resolve the vertigo; new goal is to resolve L shoulder pain; improve balance   Currently in Pain? No/denies                              Balance Exercises - 03/26/15 2036    Balance Exercises: Standing   Standing Eyes Opened Narrow base of support (BOS);Wide (BOA);Head turns;Foam/compliant surface;5 reps   Standing Eyes Closed Narrow base of support (BOS);Wide (BOA);Head turns;Foam/compliant surface;5 reps   Tandem Stance Eyes open;2 reps;30 secs   SLS Eyes open;Solid surface;Intermittent upper extremity support;10 secs   Rockerboard Anterior/posterior;30 seconds;EO;Lateral;Head turns;Intermittent UE support   Gait with Head Turns Forward;1 rep  120' around track   Tandem Gait Forward;2 reps  10' inside bars   Sidestepping 2 reps  with crossovers for braiding   Other Standing Exercises standing on Bosu - with UE support - moving each LE up/back and laterally for improved SLS                PT Long Term  Goals - 03/13/15 1618    PT LONG TERM GOAL #1   Title Pt will have a negative R horizontal roll test to indicate that R BPPV horizontal canal has resolved.  (03-18-15)   Time 4   Period Weeks   Status Achieved   PT LONG TERM GOAL #2   Title Report no vertigo with bed mobility.  (03-18-15)   Baseline met 03-13-15   Time 4   Period Weeks   Status Achieved   PT LONG TERM GOAL #3   Title Independent in HEP for habituation prn.  (03-18-15)   Baseline met 03-13-15   Time 4   Period Weeks   Status Achieved   PT LONG TERM GOAL #4   Title Perform Sensory Organization Test and establish LTG as appropriate. (04-12-15)   Time 4   Period Weeks   Status New   PT LONG TERM GOAL #5    Title Pt will subjectively report at least 25% improvement in balance/steadiness with ambulation.  (04-12-15)   Time 4   Period Weeks   Status New   Additional Long Term Goals   Additional Long Term Goals Yes   PT LONG TERM GOAL #6   Title Pt will be independent in updated HEP to include balance exercises.  (04-12-15)   Time 4   Period Weeks   Status New               Plan - 03/26/15 2039    Clinical Impression Statement Pt had no c/o nausea today with vestibular/balance exercises - pt took frequent short rest breaks which appeared to assist in decreasing vertigo intensity   Pt will benefit from skilled therapeutic intervention in order to improve on the following deficits Dizziness;Decreased balance;Decreased mobility;Difficulty walking   Rehab Potential Good   PT Frequency 2x / week   PT Duration 4 weeks   PT Treatment/Interventions ADLs/Self Care Home Management;Canalith Repostioning;Gait training;Therapeutic activities;Balance training;Neuromuscular re-education;Patient/family education;Vestibular;Ultrasound;Manual techniques;Electrical Stimulation   PT Next Visit Plan cont with dynamic balance exercises   PT Home Exercise Plan balance exercises   Consulted and Agree with Plan of Care Patient        Problem List Patient Active Problem List   Diagnosis Date Noted  . Peripheral polyneuropathy ([place]) 03/06/2015  . BPPV (benign paroxysmal positional vertigo) 03/06/2015  . Headache 01/18/2015  . Vertigo 01/18/2015  . Allergic rhinitis 09/20/2014  . DOE (dyspnea on exertion) 08/10/2014  . Severe dizziness 08/10/2014  . Acute diverticulitis 05/18/2014  . Diarrhea 05/18/2014  . LLQ pain 05/13/2014  . Chronic meniscal tear of knee 09/22/2013  . Primary localized osteoarthrosis, lower leg 09/22/2013  . Neck pain, bilateral 12/18/2012  . Headache(784.0) 12/18/2012  . Vision changes 12/18/2012  . Preventative health care 12/02/2012  . Impaired glucose tolerance  12/02/2012  . Low back pain 12/02/2012  . Palpitations 10/09/2010  . B12 deficiency 08/30/2010  . Hearing loss 08/30/2010  . Tinnitus 08/30/2010  . Orthostatic hypotension 06/28/2009  . HYPERLIPIDEMIA TYPE IIB / III 05/15/2009  . CHEST PAIN UNSPECIFIED 11/23/2008  . Hypotension, unspecified 10/12/2008  . DIZZINESS 10/12/2008  . Shortness of breath 09/14/2008  . UNSPECIFIED ANEMIA 09/13/2008  . ANXIETY DEPRESSION 09/13/2008  . Old myocardial infarction 09/13/2008  . GASTROESOPHAGEAL REFLUX DISEASE, HX OF 09/13/2008  . LUMBAR RADICULOPATHY, LEFT 08/23/2008  . MELANOMA, SHOULDER 11/27/2006  . Essential hypertension 11/27/2006  . Coronary atherosclerosis 11/27/2006  . OBSTRUCTION, BLADDER NECK 11/27/2006  . ERECTILE DYSFUNCTION, ORGANIC 11/27/2006  Physical Therapy Progress  Note  Dates of Reporting Period: 02-15-15 to 03-21-15  Objective Reports of Subjective Statement: Pt reports no vertigo with any positional testing at this time; R horizontal canalithiasis appears to have resolved  Objective Measurements: Pt has unsteady gait with ambulation with head turns; c/o vertigo with turns and with activities with EC  Goal Update: see above for progress towards goals  Plan: Neuro Re-ed for balance/vestibular training; gait training Reason Skilled Services are Required: Cont. Unsteadiness with amb. With head turns and some c/o vertigo with activities requiring incr. Vestibular input   Lauretta Sallas, [place], PT 03/26/2015, 8:44 PM  [facility] [address] [place], [place], [address] Phone: [phone]   Fax:  [phone]  Name: Troy Combs MRN: [id number] Date of Birth: 1931/05/24

## 2015-03-27 ENCOUNTER — Ambulatory Visit: Payer: Medicare Other | Admitting: Physical Therapy

## 2015-03-27 ENCOUNTER — Encounter: Payer: Self-pay | Admitting: Physical Therapy

## 2015-03-27 DIAGNOSIS — M542 Cervicalgia: Secondary | ICD-10-CM | POA: Diagnosis not present

## 2015-03-27 DIAGNOSIS — R269 Unspecified abnormalities of gait and mobility: Secondary | ICD-10-CM

## 2015-03-27 DIAGNOSIS — R2681 Unsteadiness on feet: Secondary | ICD-10-CM

## 2015-03-28 NOTE — Therapy (Signed)
[facility] [address] [place], [place], [address] Phone: [phone]   Fax:  [phone]  Physical Therapy Treatment  Patient Details  Name: Troy Combs MRN: [id number] Date of Birth: Jun 10, 1931 Referring Provider: Dr. Unice Cobble  Encounter Date: 03/27/2015      PT End of Session - 03/28/15 0954    Visit Number 11   Number of Visits 12   Date for PT Re-Evaluation 04/14/15   Authorization Type UHC Medicare   Authorization Time Period 02-15-15 - 04-29-15   PT Start Time 0932   PT Stop Time 1015   PT Time Calculation (min) 43 min      Past Medical History  Diagnosis Date  . Old myocardial infarction   . Unspecified essential hypertension   . Personal history of other diseases of digestive system   . Personal history of malignant melanoma of skin   . Dysthymic disorder   . Anemia, unspecified   . Thoracic or lumbosacral neuritis or radiculitis, unspecified   . Impotence of organic origin   . Malignant melanoma of skin of upper limb, including shoulder ([place])   . Bladder neck obstruction   . Coronary atherosclerosis of unspecified type of vessel, native or graft   . Impaired glucose tolerance 12/02/2012  . Allergic rhinitis 09/20/2014    Past Surgical History  Procedure Laterality Date  . Arthrodesis  07/06/2002    of left long finger distal interphalangeal joint with Kirschner wire fixation X 3  . Coronary artery bypass graft  10/17/2000    [name]. [name], MD  . Atrial ablation surgery  05/04/2001    Dr. [name]  . Coronary angioplasty  1993  . Hernia repair      BILATERAL  . Cholecystectomy    . Exploratory laparotomy      AFTER LIVER TRAUMA AND HAND SURGERY  . Cardiac catheterization  09/05/2008    Revealing 4 of 4 patent grafts with native multivessel coronary artery disease, EF  of 60% without regional wall motion abnormalities.    There were no vitals filed for this  visit.  Visit Diagnosis:  Unsteadiness on feet  Abnormality of gait      Subjective Assessment - 03/27/15 1351    Subjective Pt states he continues to feel unsteady with ambulation at times - feels that he weaves to the right side   Patient Stated Goals resolve the vertigo; new goal is to resolve L shoulder pain; improve balance   Currently in Pain? No/denies                         [facility] Adult PT Treatment/Exercise - 03/27/15 0950    Ambulation/Gait   Ambulation/Gait Yes             Balance Exercises - 03/28/15 0959    Balance Exercises: Standing   Standing Eyes Opened Narrow base of support (BOS);Wide (BOA);Head turns;Foam/compliant surface;5 reps   Standing Eyes Closed Narrow base of support (BOS);Wide (BOA);Head turns;Foam/compliant surface;5 reps   Tandem Stance Eyes open;2 reps;30 secs   SLS Eyes open;Solid surface;Intermittent upper extremity support;10 secs   Rockerboard Anterior/posterior;30 seconds;EO;Lateral;Head turns;Intermittent UE support   Step Ups Forward;6 inch;Intermittent UE support  10 reps each leg   Gait with Head Turns Forward;1 rep  120' around track   Tandem Gait Forward;2 reps  10' inside bars   Sidestepping 2 reps  with crossovers for braiding  PT Long Term Goals - 03/13/15 1618    PT LONG TERM GOAL #1   Title Pt will have a negative R horizontal roll test to indicate that R BPPV horizontal canal has resolved.  (03-18-15)   Time 4   Period Weeks   Status Achieved   PT LONG TERM GOAL #2   Title Report no vertigo with bed mobility.  (03-18-15)   Baseline met 03-13-15   Time 4   Period Weeks   Status Achieved   PT LONG TERM GOAL #3   Title Independent in HEP for habituation prn.  (03-18-15)   Baseline met 03-13-15   Time 4   Period Weeks   Status Achieved   PT LONG TERM GOAL #4   Title Perform Sensory Organization Test and establish LTG as appropriate. (04-12-15)   Time 4   Period Weeks    Status New   PT LONG TERM GOAL #5   Title Pt will subjectively report at least 25% improvement in balance/steadiness with ambulation.  (04-12-15)   Time 4   Period Weeks   Status New   Additional Long Term Goals   Additional Long Term Goals Yes   PT LONG TERM GOAL #6   Title Pt will be independent in updated HEP to include balance exercises.  (04-12-15)   Time 4   Period Weeks   Status New               Plan - 03/28/15 0955    Clinical Impression Statement Pt continues to demonstrate decreased single limb stance with > difficulty noted on LLE with decr. L ankle stabiltiy noted - pt reports no known prveious injury that would contribute to L ankle instability      Pt will benefit from skilled therapeutic intervention in order to improve on the following deficits Dizziness;Decreased balance;Decreased mobility;Difficulty walking   Rehab Potential Good   PT Frequency 2x / week   PT Duration 4 weeks   PT Treatment/Interventions ADLs/Self Care Home Management;Canalith Repostioning;Gait training;Therapeutic activities;Balance training;Neuromuscular re-education;Patient/family education;Vestibular;Ultrasound;Manual techniques;Electrical Stimulation   PT Next Visit Plan cont with dynamic balance exercises   PT Home Exercise Plan balance exercises   Consulted and Agree with Plan of Care Patient        Problem List Patient Active Problem List   Diagnosis Date Noted  . Peripheral polyneuropathy ([place]) 03/06/2015  . BPPV (benign paroxysmal positional vertigo) 03/06/2015  . Headache 01/18/2015  . Vertigo 01/18/2015  . Allergic rhinitis 09/20/2014  . DOE (dyspnea on exertion) 08/10/2014  . Severe dizziness 08/10/2014  . Acute diverticulitis 05/18/2014  . Diarrhea 05/18/2014  . LLQ pain 05/13/2014  . Chronic meniscal tear of knee 09/22/2013  . Primary localized osteoarthrosis, lower leg 09/22/2013  . Neck pain, bilateral 12/18/2012  . Headache(784.0) 12/18/2012  . Vision changes  12/18/2012  . Preventative health care 12/02/2012  . Impaired glucose tolerance 12/02/2012  . Low back pain 12/02/2012  . Palpitations 10/09/2010  . B12 deficiency 08/30/2010  . Hearing loss 08/30/2010  . Tinnitus 08/30/2010  . Orthostatic hypotension 06/28/2009  . HYPERLIPIDEMIA TYPE IIB / III 05/15/2009  . CHEST PAIN UNSPECIFIED 11/23/2008  . Hypotension, unspecified 10/12/2008  . DIZZINESS 10/12/2008  . Shortness of breath 09/14/2008  . UNSPECIFIED ANEMIA 09/13/2008  . ANXIETY DEPRESSION 09/13/2008  . Old myocardial infarction 09/13/2008  . GASTROESOPHAGEAL REFLUX DISEASE, HX OF 09/13/2008  . LUMBAR RADICULOPATHY, LEFT 08/23/2008  . MELANOMA, SHOULDER 11/27/2006  . Essential hypertension 11/27/2006  . Coronary atherosclerosis 11/27/2006  .  OBSTRUCTION, BLADDER NECK 11/27/2006  . ERECTILE DYSFUNCTION, ORGANIC 11/27/2006    Alda Lea, PT  03/28/2015, 10:00 AM  [facility] [address] [place], [place], [address] Phone: [phone]   Fax:  [phone]  Name: Troy Combs MRN: [id number] Date of Birth: 1931/05/01

## 2015-03-30 ENCOUNTER — Ambulatory Visit: Payer: Medicare Other | Attending: Internal Medicine | Admitting: Physical Therapy

## 2015-03-30 DIAGNOSIS — R269 Unspecified abnormalities of gait and mobility: Secondary | ICD-10-CM | POA: Diagnosis present

## 2015-03-30 DIAGNOSIS — R2681 Unsteadiness on feet: Secondary | ICD-10-CM | POA: Diagnosis present

## 2015-04-02 ENCOUNTER — Encounter: Payer: Self-pay | Admitting: Physical Therapy

## 2015-04-02 NOTE — Therapy (Signed)
[facility] [address] [place], [place], [address] Phone: [phone]   Fax:  [phone]  Physical Therapy Treatment  Patient Details  Name: Troy Combs MRN: [id number] Date of Birth: 04/04/1932 Referring Provider: Dr. Unice Cobble  Encounter Date: 03/30/2015      PT End of Session - 04/02/15 1905    Visit Number 12   Number of Visits 16   Date for PT Re-Evaluation 04/14/15   Authorization Type UHC Medicare   Authorization Time Period 02-15-15 - 04-29-15   PT Start Time 0847   PT Stop Time 0932   PT Time Calculation (min) 45 min      Past Medical History  Diagnosis Date  . Old myocardial infarction   . Unspecified essential hypertension   . Personal history of other diseases of digestive system   . Personal history of malignant melanoma of skin   . Dysthymic disorder   . Anemia, unspecified   . Thoracic or lumbosacral neuritis or radiculitis, unspecified   . Impotence of organic origin   . Malignant melanoma of skin of upper limb, including shoulder ([place])   . Bladder neck obstruction   . Coronary atherosclerosis of unspecified type of vessel, native or graft   . Impaired glucose tolerance 12/02/2012  . Allergic rhinitis 09/20/2014    Past Surgical History  Procedure Laterality Date  . Arthrodesis  07/06/2002    of left long finger distal interphalangeal joint with Kirschner wire fixation X 3  . Coronary artery bypass graft  10/17/2000    [name]. [name], MD  . Atrial ablation surgery  05/04/2001    Dr. [name]  . Coronary angioplasty  1993  . Hernia repair      BILATERAL  . Cholecystectomy    . Exploratory laparotomy      AFTER LIVER TRAUMA AND HAND SURGERY  . Cardiac catheterization  09/05/2008    Revealing 4 of 4 patent grafts with native multivessel coronary artery disease, EF  of 60% without regional wall motion abnormalities.    There were no vitals filed for this  visit.  Visit Diagnosis:  Unsteadiness on feet  Abnormality of gait      Subjective Assessment - 04/02/15 1902    Subjective Pt states that he still weaves toward R side when he walks - feels unsteady   Patient Stated Goals resolve the vertigo; new goal is to resolve L shoulder pain; improve balance   Currently in Pain? No/denies                              Balance Exercises - 04/02/15 1903    Balance Exercises: Standing   Standing Eyes Opened Narrow base of support (BOS);Wide (BOA);Head turns;Foam/compliant surface;5 reps   Standing Eyes Closed Narrow base of support (BOS);Wide (BOA);Head turns;Foam/compliant surface;5 reps   Tandem Stance Eyes open;2 reps;30 secs   SLS Eyes open;Solid surface;Intermittent upper extremity support;10 secs   Rockerboard Anterior/posterior;30 seconds;EO;Lateral;Head turns;Intermittent UE support   Step Ups Forward;6 inch;Intermittent UE support  10 reps each leg   Gait with Head Turns Forward;1 rep  120' around track   Tandem Gait Forward;2 reps  10' inside bars   Sidestepping 3 reps  with braiding added with UE support prn   Other Standing Exercises standing on Bosu - with UE support - moving each LE up/back and laterally for improved SLS  PT Long Term Goals - 03/13/15 1618    PT LONG TERM GOAL #1   Title Pt will have a negative R horizontal roll test to indicate that R BPPV horizontal canal has resolved.  (03-18-15)   Time 4   Period Weeks   Status Achieved   PT LONG TERM GOAL #2   Title Report no vertigo with bed mobility.  (03-18-15)   Baseline met 03-13-15   Time 4   Period Weeks   Status Achieved   PT LONG TERM GOAL #3   Title Independent in HEP for habituation prn.  (03-18-15)   Baseline met 03-13-15   Time 4   Period Weeks   Status Achieved   PT LONG TERM GOAL #4   Title Perform Sensory Organization Test and establish LTG as appropriate. (04-12-15)   Time 4   Period Weeks    Status New   PT LONG TERM GOAL #5   Title Pt will subjectively report at least 25% improvement in balance/steadiness with ambulation.  (04-12-15)   Time 4   Period Weeks   Status New   Additional Long Term Goals   Additional Long Term Goals Yes   PT LONG TERM GOAL #6   Title Pt will be independent in updated HEP to include balance exercises.  (04-12-15)   Time 4   Period Weeks   Status New               Plan - 04/02/15 1906    Clinical Impression Statement Pt progressing well towards goals - cont to c/o nausea with activities requiring head turns if performed excessively - able to control with seated frequent rest breaks   Pt will benefit from skilled therapeutic intervention in order to improve on the following deficits Dizziness;Decreased balance;Decreased mobility;Difficulty walking   Rehab Potential Good   PT Frequency 2x / week   PT Duration 4 weeks   PT Treatment/Interventions ADLs/Self Care Home Management;Canalith Repostioning;Gait training;Therapeutic activities;Balance training;Neuromuscular re-education;Patient/family education;Vestibular;Ultrasound;Manual techniques;Electrical Stimulation   PT Next Visit Plan cont with dynamic balance exercises   PT Home Exercise Plan balance exercises   Consulted and Agree with Plan of Care Patient        Problem List Patient Active Problem List   Diagnosis Date Noted  . Peripheral polyneuropathy ([place]) 03/06/2015  . BPPV (benign paroxysmal positional vertigo) 03/06/2015  . Headache 01/18/2015  . Vertigo 01/18/2015  . Allergic rhinitis 09/20/2014  . DOE (dyspnea on exertion) 08/10/2014  . Severe dizziness 08/10/2014  . Acute diverticulitis 05/18/2014  . Diarrhea 05/18/2014  . LLQ pain 05/13/2014  . Chronic meniscal tear of knee 09/22/2013  . Primary localized osteoarthrosis, lower leg 09/22/2013  . Neck pain, bilateral 12/18/2012  . Headache(784.0) 12/18/2012  . Vision changes 12/18/2012  . Preventative health care  12/02/2012  . Impaired glucose tolerance 12/02/2012  . Low back pain 12/02/2012  . Palpitations 10/09/2010  . B12 deficiency 08/30/2010  . Hearing loss 08/30/2010  . Tinnitus 08/30/2010  . Orthostatic hypotension 06/28/2009  . HYPERLIPIDEMIA TYPE IIB / III 05/15/2009  . CHEST PAIN UNSPECIFIED 11/23/2008  . Hypotension, unspecified 10/12/2008  . DIZZINESS 10/12/2008  . Shortness of breath 09/14/2008  . UNSPECIFIED ANEMIA 09/13/2008  . ANXIETY DEPRESSION 09/13/2008  . Old myocardial infarction 09/13/2008  . GASTROESOPHAGEAL REFLUX DISEASE, HX OF 09/13/2008  . LUMBAR RADICULOPATHY, LEFT 08/23/2008  . MELANOMA, SHOULDER 11/27/2006  . Essential hypertension 11/27/2006  . Coronary atherosclerosis 11/27/2006  . OBSTRUCTION, BLADDER NECK 11/27/2006  . ERECTILE DYSFUNCTION,  ORGANIC 11/27/2006    Alda Lea, PT 04/02/2015, 7:08 PM  [facility] [address] [place], [place], [address] Phone: [phone]   Fax:  [phone]  Name: CONSTANTINOS KREMPASKY MRN: [id number] Date of Birth: 1931/11/07

## 2015-04-11 ENCOUNTER — Ambulatory Visit: Payer: Medicare Other | Admitting: Physical Therapy

## 2015-04-11 DIAGNOSIS — R2681 Unsteadiness on feet: Secondary | ICD-10-CM

## 2015-04-11 DIAGNOSIS — R269 Unspecified abnormalities of gait and mobility: Secondary | ICD-10-CM

## 2015-04-12 ENCOUNTER — Encounter: Payer: Self-pay | Admitting: Physical Therapy

## 2015-04-12 NOTE — Therapy (Signed)
[facility] [address] [place], [place], [address] Phone: [phone]   Fax:  [phone]  Physical Therapy Treatment  Patient Details  Name: Troy Combs MRN: [id number] Date of Birth: 1932/03/10 Referring Provider: Dr. Unice Cobble  Encounter Date: 04/11/2015      PT End of Session - 04/12/15 2005    Visit Number 13   Number of Visits 16   Date for PT Re-Evaluation 04/14/15   Authorization Type U[employer] Medicare   Authorization Time Period 02-15-15 - 04-29-15   PT Start Time 0845   PT Stop Time 0930   PT Time Calculation (min) 45 min      Past Medical History  Diagnosis Date  . Old myocardial infarction   . Unspecified essential hypertension   . Personal history of other diseases of digestive system   . Personal history of malignant melanoma of skin   . Dysthymic disorder   . Anemia, unspecified   . Thoracic or lumbosacral neuritis or radiculitis, unspecified   . Impotence of organic origin   . Malignant melanoma of skin of upper limb, including shoulder ([place])   . Bladder neck obstruction   . Coronary atherosclerosis of unspecified type of vessel, native or graft   . Impaired glucose tolerance 12/02/2012  . Allergic rhinitis 09/20/2014    Past Surgical History  Procedure Laterality Date  . Arthrodesis  07/06/2002    of left long finger distal interphalangeal joint with Kirschner wire fixation X 3  . Coronary artery bypass graft  10/17/2000    [name]. [name], MD  . Atrial ablation surgery  05/04/2001    Dr. [name]  . Coronary angioplasty  1993  . Hernia repair      BILATERAL  . Cholecystectomy    . Exploratory laparotomy      AFTER LIVER TRAUMA AND HAND SURGERY  . Cardiac catheterization  09/05/2008    Revealing 4 of 4 patent grafts with native multivessel coronary artery disease, EF  of 60% without regional wall motion abnormalities.    There were no vitals filed for this  visit.  Visit Diagnosis:  Unsteadiness on feet  Abnormality of gait      Subjective Assessment - 04/12/15 2004    Subjective Pt states that his performance fluctuates - "some days are better than others"   Patient is accompained by: Family member   Patient Stated Goals resolve the vertigo; new goal is to resolve L shoulder pain; improve balance   Currently in Pain? No/denies       Sensory Organization Test score 72/100 for composite (N=68/100; Normal for somatosensory, visual, and vestibular inputs  Pt did report some slight dizziness after completion of test;  Self Care; explained results of SOT to pt and discussed results; instructed pt to consider use of walking pole or cane for assistance  ith amb. Outside on uneven terrain to incr. And decr. Fall risk - pt amb. Approx. 47' with use of walking pole with instruction on sequence,  correct side and explanation as to why pole is to be used on uninvolved side - pt verbalized understanding                            PT Long Term Goals - 03/13/15 1618    PT LONG TERM GOAL #1   Title Pt will have a negative R horizontal roll test to indicate that R BPPV horizontal canal has resolved.  (  03-18-15)   Time 4   Period Weeks   Status Achieved   PT LONG TERM GOAL #2   Title Report no vertigo with bed mobility.  (03-18-15)   Baseline met 03-13-15   Time 4   Period Weeks   Status Achieved   PT LONG TERM GOAL #3   Title Independent in HEP for habituation prn.  (03-18-15)   Baseline met 03-13-15   Time 4   Period Weeks   Status Achieved   PT LONG TERM GOAL #4   Title Perform Sensory Organization Test and establish LTG as appropriate. (04-12-15)   Time 4   Period Weeks   Status New   PT LONG TERM GOAL #5   Title Pt will subjectively report at least 25% improvement in balance/steadiness with ambulation.  (04-12-15)   Time 4   Period Weeks   Status New   Additional Long Term Goals   Additional Long Term  Goals Yes   PT LONG TERM GOAL #6   Title Pt will be independent in updated HEP to include balance exercises.  (04-12-15)   Time 4   Period Weeks   Status New               Plan - 04/12/15 2006    Clinical Impression Statement SOT score is WNL's for composite score and somatosensory, visual, and vestibular inputs   Pt will benefit from skilled therapeutic intervention in order to improve on the following deficits Dizziness;Decreased balance;Decreased mobility;Difficulty walking   Rehab Potential Good   PT Frequency 2x / week   PT Duration 4 weeks   PT Treatment/Interventions ADLs/Self Care Home Management;Canalith Repostioning;Gait training;Therapeutic activities;Balance training;Neuromuscular re-education;Patient/family education;Vestibular;Ultrasound;Manual techniques;Electrical Stimulation   PT Next Visit Plan cont with dynamic balance exercises   PT Home Exercise Plan balance exercises   Consulted and Agree with Plan of Care Patient        Problem List Patient Active Problem List   Diagnosis Date Noted  . Peripheral polyneuropathy ([place]) 03/06/2015  . BPPV (benign paroxysmal positional vertigo) 03/06/2015  . Headache 01/18/2015  . Vertigo 01/18/2015  . Allergic rhinitis 09/20/2014  . DOE (dyspnea on exertion) 08/10/2014  . Severe dizziness 08/10/2014  . Acute diverticulitis 05/18/2014  . Diarrhea 05/18/2014  . LLQ pain 05/13/2014  . Chronic meniscal tear of knee 09/22/2013  . Primary localized osteoarthrosis, lower leg 09/22/2013  . Neck pain, bilateral 12/18/2012  . Headache(784.0) 12/18/2012  . Vision changes 12/18/2012  . Preventative health care 12/02/2012  . Impaired glucose tolerance 12/02/2012  . Low back pain 12/02/2012  . Palpitations 10/09/2010  . B12 deficiency 08/30/2010  . Hearing loss 08/30/2010  . Tinnitus 08/30/2010  . Orthostatic hypotension 06/28/2009  . HYPERLIPIDEMIA TYPE IIB / III 05/15/2009  . CHEST PAIN UNSPECIFIED 11/23/2008  .  Hypotension, unspecified 10/12/2008  . DIZZINESS 10/12/2008  . Shortness of breath 09/14/2008  . UNSPECIFIED ANEMIA 09/13/2008  . ANXIETY DEPRESSION 09/13/2008  . Old myocardial infarction 09/13/2008  . GASTROESOPHAGEAL REFLUX DISEASE, HX OF 09/13/2008  . LUMBAR RADICULOPATHY, LEFT 08/23/2008  . MELANOMA, SHOULDER 11/27/2006  . Essential hypertension 11/27/2006  . Coronary atherosclerosis 11/27/2006  . OBSTRUCTION, BLADDER NECK 11/27/2006  . ERECTILE DYSFUNCTION, ORGANIC 11/27/2006    Alda Lea, PT 04/12/2015, 8:09 PM  [facility] [address] [place], [place], [address] Phone: [phone]   Fax:  [phone]  Name: Troy Combs MRN: [id number] Date of Birth: 1931-05-27

## 2015-04-13 ENCOUNTER — Ambulatory Visit: Payer: Medicare Other | Admitting: Physical Therapy

## 2015-04-13 VITALS — BP 138/95 | HR 76

## 2015-04-13 DIAGNOSIS — R2681 Unsteadiness on feet: Secondary | ICD-10-CM | POA: Diagnosis not present

## 2015-04-13 DIAGNOSIS — R269 Unspecified abnormalities of gait and mobility: Secondary | ICD-10-CM

## 2015-04-13 NOTE — Therapy (Signed)
[facility] [address] [place], [place], [address] Phone: [phone]   Fax:  [phone]  Physical Therapy Treatment  Patient Details  Name: Troy Combs MRN: [id number] Date of Birth: [birth year] Referring Provider: Dr. Unice Cobble  Encounter Date: 04/13/2015      PT End of Session - 04/19/15 1630    Visit Number 14   Number of Visits 16   Date for PT Re-Evaluation 04/14/15   Authorization Type UHC Medicare   Authorization Time Period 02-15-15 - 04-29-15   PT Start Time 1015   PT Stop Time 1100   PT Time Calculation (min) 45 min      Past Medical History  Diagnosis Date  . Old myocardial infarction   . Unspecified essential hypertension   . Personal history of other diseases of digestive system   . Personal history of malignant melanoma of skin   . Dysthymic disorder   . Anemia, unspecified   . Thoracic or lumbosacral neuritis or radiculitis, unspecified   . Impotence of organic origin   . Malignant melanoma of skin of upper limb, including shoulder ([place])   . Bladder neck obstruction   . Coronary atherosclerosis of unspecified type of vessel, native or graft   . Impaired glucose tolerance 12/02/2012  . Allergic rhinitis 09/20/2014    Past Surgical History  Procedure Laterality Date  . Arthrodesis  07/06/2002    of left long finger distal interphalangeal joint with Kirschner wire fixation X 3  . Coronary artery bypass graft  10/17/2000    [name]. [name], MD  . Atrial ablation surgery  05/04/2001    Dr. [name]  . Coronary angioplasty  1993  . Hernia repair      BILATERAL  . Cholecystectomy    . Exploratory laparotomy      AFTER LIVER TRAUMA AND HAND SURGERY  . Cardiac catheterization  09/05/2008    Revealing 4 of 4 patent grafts with native multivessel coronary artery disease, EF  of 60% without regional wall motion abnormalities.    Filed Vitals:   04/13/15 1051  BP: 138/95   Pulse: 76    Visit Diagnosis:  Unsteadiness on feet  Abnormality of gait      Subjective Assessment - 04/18/15 1659    Subjective Pt states that he still feels that his balance is "off" a little - states that he will be walking and just kind of drift over toward R side   Patient Stated Goals resolve the vertigo; new goal is to resolve L shoulder pain; improve balance   Currently in Pain? No/denies     Sensory Organization test score - score 72/100 with N= 65/100; Normal somatosensory, visual and vestibular inputs per SOT:   No falls on any trials - no vestibular deficits per this test  Blood pressure recorded upon initial standing - 96/65 with heart rate increased to 87 from 76 bpm upon initial standing  Discussed symptoms and fluctuations in "light-headedness feeling" and fluctuations in balance/steadiness during gait Pt states that the true spinning vertigo remains resolved and is no longer a problem at this time                              PT Long Term Goals - 04/19/15 1646    PT LONG TERM GOAL #4   Title Perform Sensory Organization Test and establish LTG as appropriate. (04-12-15)   Baseline SOT score WNL's -  04-11-15   Status Deferred  LTG not needed due to score WNL's   PT LONG TERM GOAL #5   Title Pt will subjectively report at least 25% improvement in balance/steadiness with ambulation.  (04-12-15)   Baseline pt reports balance varies - states that some days are better than others - 04-13-15   Status Partially Met   PT LONG TERM GOAL #6   Title Pt will be independent in updated HEP to include balance exercises.  (04-12-15)   Status Achieved               Plan - 04/19/15 1644    Clinical Impression Statement Pt has met most of LTG's - continues to report fluctuations in balance/steadiness during ambulation and feelings of light=headedness vary in intensity and occurrence - pt has symptoms of orthostatic hypotension - recommended pt  to follow up with cardiologist   Pt will benefit from skilled therapeutic intervention in order to improve on the following deficits Dizziness;Decreased balance;Decreased mobility;Difficulty walking   Rehab Potential Good   PT Frequency 2x / week   PT Duration 4 weeks   PT Treatment/Interventions ADLs/Self Care Home Management;Canalith Repostioning;Gait training;Therapeutic activities;Balance training;Neuromuscular re-education;Patient/family education;Vestibular;Ultrasound;Manual techniques;Electrical Stimulation   PT Next Visit Plan N/A - D/C   PT Home Exercise Plan balance exercises   Consulted and Agree with Plan of Care Patient        Problem List Patient Active Problem List   Diagnosis Date Noted  . Peripheral polyneuropathy ([place]) 03/06/2015  . BPPV (benign paroxysmal positional vertigo) 03/06/2015  . Headache 01/18/2015  . Vertigo 01/18/2015  . Allergic rhinitis 09/20/2014  . DOE (dyspnea on exertion) 08/10/2014  . Severe dizziness 08/10/2014  . Acute diverticulitis 05/18/2014  . Diarrhea 05/18/2014  . LLQ pain 05/13/2014  . Chronic meniscal tear of knee 09/22/2013  . Primary localized osteoarthrosis, lower leg 09/22/2013  . Neck pain, bilateral 12/18/2012  . Headache(784.0) 12/18/2012  . Vision changes 12/18/2012  . Preventative health care 12/02/2012  . Impaired glucose tolerance 12/02/2012  . Low back pain 12/02/2012  . Palpitations 10/09/2010  . B12 deficiency 08/30/2010  . Hearing loss 08/30/2010  . Tinnitus 08/30/2010  . Orthostatic hypotension 06/28/2009  . HYPERLIPIDEMIA TYPE IIB / III 05/15/2009  . CHEST PAIN UNSPECIFIED 11/23/2008  . Hypotension, unspecified 10/12/2008  . DIZZINESS 10/12/2008  . Shortness of breath 09/14/2008  . UNSPECIFIED ANEMIA 09/13/2008  . ANXIETY DEPRESSION 09/13/2008  . Old myocardial infarction 09/13/2008  . GASTROESOPHAGEAL REFLUX DISEASE, HX OF 09/13/2008  . LUMBAR RADICULOPATHY, LEFT 08/23/2008  . MELANOMA, SHOULDER  11/27/2006  . Essential hypertension 11/27/2006  . Coronary atherosclerosis 11/27/2006  . OBSTRUCTION, BLADDER NECK 11/27/2006  . ERECTILE DYSFUNCTION, ORGANIC 11/27/2006    PHYSICAL THERAPY DISCHARGE SUMMARY  Visits from Start of Care: 14  Current functional level related to goals / functional outcomes: See above for progress towards goals   Remaining deficits: Continued c/o lightheadedness at times - pt has significant drop in diastolic blood pressure upon initial standing - indicative of possible orthostatic hypotension - states that he continues to have unsteadiness and a feeling of light-headednes at times - states this varies in intensity and occurrence; recommended pt to follow up with cardiologist for further assessment   Education / Equipment: Pt has been instructed in a HEP consisting of balance/vestibular exercises Plan: Patient agrees to discharge.  Patient goals were partially met. Patient is being discharged due to meeting the stated rehab goals.  ?????  Pt has plateaued in achieving functional  progress at this time.          Alda Lea, PT 04/19/2015, 4:50 PM  [facility] [address] [place], [place], [address] Phone: [phone]   Fax:  [phone]  Name: Troy Combs MRN: [id number] Date of Birth: 10/17/1931

## 2015-04-18 ENCOUNTER — Ambulatory Visit: Payer: Medicare Other | Admitting: Physical Therapy

## 2015-04-18 ENCOUNTER — Encounter: Payer: Self-pay | Admitting: Physical Therapy

## 2015-04-20 ENCOUNTER — Encounter: Payer: Medicare Other | Admitting: Physical Therapy

## 2015-05-04 ENCOUNTER — Ambulatory Visit (INDEPENDENT_AMBULATORY_CARE_PROVIDER_SITE_OTHER): Payer: Medicare Other | Admitting: Cardiology

## 2015-05-04 ENCOUNTER — Ambulatory Visit (INDEPENDENT_AMBULATORY_CARE_PROVIDER_SITE_OTHER): Payer: Medicare Other

## 2015-05-04 ENCOUNTER — Encounter: Payer: Self-pay | Admitting: Cardiology

## 2015-05-04 VITALS — BP 150/94 | HR 84

## 2015-05-04 DIAGNOSIS — R002 Palpitations: Secondary | ICD-10-CM

## 2015-05-04 LAB — BASIC METABOLIC PANEL
BUN: 21 mg/dL (ref 7–25)
CO2: 24 mmol/L (ref 20–31)
Calcium: 9.3 mg/dL (ref 8.6–10.3)
Chloride: 106 mmol/L (ref 98–110)
Creat: 0.87 mg/dL (ref 0.70–1.11)
Glucose, Bld: 106 mg/dL — ABNORMAL HIGH (ref 65–99)
Potassium: 4.2 mmol/L (ref 3.5–5.3)
Sodium: 139 mmol/L (ref 135–146)

## 2015-05-04 LAB — CBC WITH DIFFERENTIAL/PLATELET
Basophils Absolute: 0 10*3/uL (ref 0.0–0.1)
Basophils Relative: 0 % (ref 0–1)
Eosinophils Absolute: 0.3 10*3/uL (ref 0.0–0.7)
Eosinophils Relative: 4 % (ref 0–5)
HCT: 44.9 % (ref 39.0–52.0)
Hemoglobin: 15.8 g/dL (ref 13.0–17.0)
Lymphocytes Relative: 24 % (ref 12–46)
Lymphs Abs: 1.8 10*3/uL (ref 0.7–4.0)
MCH: 29.9 pg (ref 26.0–34.0)
MCHC: 35.2 g/dL (ref 30.0–36.0)
MCV: 85 fL (ref 78.0–100.0)
MPV: 10 fL (ref 8.6–12.4)
Monocytes Absolute: 0.8 10*3/uL (ref 0.1–1.0)
Monocytes Relative: 10 % (ref 3–12)
Neutro Abs: 4.7 10*3/uL (ref 1.7–7.7)
Neutrophils Relative %: 62 % (ref 43–77)
Platelets: 164 10*3/uL (ref 150–400)
RBC: 5.28 MIL/uL (ref 4.22–5.81)
RDW: 13.6 % (ref 11.5–15.5)
WBC: 7.6 10*3/uL (ref 4.0–10.5)

## 2015-05-04 LAB — TSH: TSH: 2.933 u[IU]/mL (ref 0.350–4.500)

## 2015-05-04 LAB — MAGNESIUM: Magnesium: 2 mg/dL (ref 1.5–2.5)

## 2015-05-04 MED ORDER — CARVEDILOL 3.125 MG PO TABS: 3.1250 mg | ORAL_TABLET | Freq: Two times a day (BID) | ORAL | Status: DC

## 2015-05-04 NOTE — Progress Notes (Signed)
05/04/2015 DECARLO MATSEN   [birth year]  [id number]  Primary Physician [name], MD Primary Cardiologist: Dr. [name]   Reason for Visit/CC: Palpitations  HPI:  The patient is a 82 y/o male, previously followed by Dr. Verl Blalock but now care for by Dr. [name], who presents with a complaint of palpitations.  His past cardiac history is significant for CAD s/p MI and CABG x 4 in 2002, HTN, atrial flutter s/p atrial ablation by Dr. Lovena Le in 2003, known orthostatic hypotension, symptomatic PVCs and chronotropic incompetence. His last LHC was in May 2010 which showed patient LIMA-LAD, SVG-RCA and SVG-OM1/OM2. In the past, he was on high dose metoprolol, 100 mg long acting, for suppression of PVCs.   During his most recent office visits with Dr. [name], his main complaint has been dizziness on exertion. Dr. [name] ordered for him to get a 2D echo 07/2014. This showed normal LVEF of 65-70%,  grade 1 diast dysfxn, mild focal basal hypertrophy of the septum and mild LAE. She also ordered for him to undergo an exercise NST, however due to chronotripic incompetence this was switched to a Lexiscan. The study was negative for ischemia. Dr. [name] felt that his symptomatic dizziness was likely secondary to a combination of orthostatic hypotension and chroniotropic incompetence. Subsequently, she discontinued his metoprolol XL and placed him on low dose Coreg.   Today in clinic he notes palpitations that occur almost daily. Worse over the last several months. He continues to have mild dizziness, especially with ambulation. He denies fatigue, chest pain, dyspnea, syncope/ near syncope, however the frequent palpitations are bothersome. He has drinks only 1 cup of coffee/day.    EKG today shows NSR with 1 PVC. HR is 84 bpm. BP is 150/94.   Current Outpatient Prescriptions  Medication Sig Dispense Refill  . albuterol (PROVENTIL HFA;VENTOLIN HFA) 108 (90 BASE) MCG/ACT inhaler Inhale 2 puffs into the lungs every 6  (six) hours as needed for wheezing or shortness of breath. 1 Inhaler 11  . aspirin 81 MG tablet Take 81 mg by mouth daily.    . carvedilol (COREG) 3.125 MG tablet Take 1 tablet (3.125 mg total) by mouth 2 (two) times daily with a meal. 180 tablet 3  . cetirizine (ZYRTEC) 10 MG tablet Take 1 tablet (10 mg total) by mouth daily. 30 tablet 11  . finasteride (PROSCAR) 5 MG tablet Take 5 mg by mouth daily.    . nitroGLYCERIN (NITROSTAT) 0.4 MG SL tablet Place 0.4 mg under the tongue every 5 (five) minutes as needed for chest pain (x 3 doses).    . nortriptyline (PAMELOR) 25 MG capsule Take 1 capsule (25 mg total) by mouth at bedtime. 90 capsule 1  . rosuvastatin (CRESTOR) 10 MG tablet Take 10 mg by mouth daily.    . sertraline (ZOLOFT) 50 MG tablet Take 50 mg by mouth daily.    . tamsulosin (FLOMAX) 0.4 MG CAPS capsule Take 0.4 mg by mouth daily after supper.    . carvedilol (COREG) 3.125 MG tablet Take 1 tablet (3.125 mg total) by mouth 2 (two) times daily. 14 tablet 0   No current facility-administered medications for this visit.    Allergies  Allergen Reactions  . Iohexol Other (See Comments)     Code: HIVES, Desc: PATENT STATES HE IS ALLERGIC TO IV DYE 09/14/08/RM, Onset Date: [id number]   . Ciprofloxacin Other (See Comments)    Patient unsure just know he has a reaction to med    Social History  Social History  . Marital Status: Married    Spouse Name: N/A  . Number of Children: N/A  . Years of Education: N/A   Occupational History  . Not on file.   Social History Main Topics  . Smoking status: Former Smoker    Quit date: 09/21/1955  . Smokeless tobacco: Never Used  . Alcohol Use: 0.0 oz/week    0 Standard drinks or equivalent per week     Comment: one or two ounces a week  . Drug Use: No  . Sexual Activity: Not on file   Other Topics Concern  . Not on file   Social History Narrative   Lives with wife in a one story home.  Has 5 children.  Retired Freight forwarder for [employer].   Education: 2 years of college.      Review of Systems: General: negative for chills, fever, night sweats or weight changes.  Cardiovascular: negative for chest pain, dyspnea on exertion, edema, orthopnea, + palpitations, no paroxysmal nocturnal dyspnea or shortness of breath Dermatological: negative for rash Respiratory: negative for cough or wheezing Urologic: negative for hematuria Abdominal: negative for nausea, vomiting, diarrhea, bright red blood per rectum, melena, or hematemesis Neurologic: negative for visual changes, syncope, or dizziness All other systems reviewed and are otherwise negative except as noted above.    Blood pressure 150/94, pulse 84.  General appearance: alert, cooperative and no distress Neck: no carotid bruit and no JVD Lungs: clear to auscultation bilaterally Heart: regular rate and rhythm and PVCs Extremities: no LEE Pulses: 2+ and symmetric Skin: warm and dry Neurologic: Grossly normal  EKG NSR. 84 bpm. 1 PVC. No ischemia  ASSESSMENT AND PLAN:   2. Palpitations: patient notes high frequency rate, which is bothersome for him. He has a past h/o symptomatic PVCs and was once on high dose metoprolol, 100 mg of long acting daily. However this was discontinued due to orthostatic hypotension + chronotropic incompetence. He is now on low dose Coreg. Given he is symptomatic but has been unable to tolerate medical therapy, we will plan to refer back to EP clinic to see Dr. Lovena Le (past h/o atrial ablation in 2003). ? Possible PVC ablation vs PPM given chronotropic incompetence, to allow treatment with higher dose BB. We will order a 48 hr heart monitor to assess PVC burden. We will also order a TSH, BMP to assess K and check a Mg level and CBC. He has been advised to avoid caffeine.   2. CAD: s/p CABG in the past. Low risk NST 07/2014. Denies CP.   3. HLD: on statin therapy.   4. H/o Exertional Dizziness: per Dr. [name], felt to be likely due to orthostatic  hypotension + chronotropic incompetence. Metoprolol was discontinued and he is now on low dose Coreg. Improved some but still with mild symptoms and has to ambulate with use of a walking stick for stabilization. Increase sodium and fluid intake + fluid before exercise (Gatorade).    Amontae Ng PA-C 05/04/2015 1:21 PM

## 2015-05-04 NOTE — Patient Instructions (Addendum)
Medication Instructions:  Your physician recommends that you continue on your current medications as directed. Please refer to the Current Medication list given to you today.  Labwork: Your physician recommends that you have lab work today. BMET, CBC, TSH, and Mg   Testing/Procedures: Your physician has recommended that you wear a 48 hour holter monitor. Holter monitors are medical devices that record the heart's electrical activity. Doctors most often use these monitors to diagnose arrhythmias. Arrhythmias are problems with the speed or rhythm of the heartbeat. The monitor is a small, portable device. You can wear one while you do your normal daily activities. This is usually used to diagnose what is causing palpitations/syncope (passing out).   Follow-Up: Your physician recommends that you schedule a follow-up appointment in: as soon as possible after monitor with Dr. Lovena Le for symptomatic PVCs. Assess for PVC burden.   Your physician wants you to follow-up in: 6 months with Dr. [name]. You will receive a reminder letter in the mail two months in advance. If you don't receive a letter, please call our office to schedule the follow-up appointment.   If you need a refill on your cardiac medications before your next appointment, please call your pharmacy.

## 2015-05-08 ENCOUNTER — Encounter: Payer: Self-pay | Admitting: Internal Medicine

## 2015-05-11 ENCOUNTER — Ambulatory Visit (INDEPENDENT_AMBULATORY_CARE_PROVIDER_SITE_OTHER): Payer: Medicare Other | Admitting: Internal Medicine

## 2015-05-11 ENCOUNTER — Encounter: Payer: Self-pay | Admitting: Internal Medicine

## 2015-05-11 VITALS — BP 138/80 | HR 78 | Ht 69.5 in | Wt 174.8 lb

## 2015-05-11 DIAGNOSIS — I493 Ventricular premature depolarization: Secondary | ICD-10-CM

## 2015-05-11 DIAGNOSIS — I951 Orthostatic hypotension: Secondary | ICD-10-CM

## 2015-05-11 NOTE — Patient Instructions (Signed)
Medication Instructions:  Your physician recommends that you continue on your current medications as directed. Please refer to the Current Medication list given to you today.  Labwork: None ordered.  Testing/Procedures: None ordered.  Follow-Up: Your physician recommends that you schedule a follow-up appointment as needed.   Any Other Special Instructions Will Be Listed Below (If Applicable).     If you need a refill on your cardiac medications before your next appointment, please call your pharmacy.   

## 2015-05-12 ENCOUNTER — Other Ambulatory Visit: Payer: Self-pay | Admitting: Internal Medicine

## 2015-05-16 ENCOUNTER — Ambulatory Visit: Payer: Medicare Other | Admitting: Internal Medicine

## 2015-05-24 DIAGNOSIS — I493 Ventricular premature depolarization: Secondary | ICD-10-CM | POA: Insufficient documentation

## 2015-05-24 NOTE — Progress Notes (Signed)
Electrophysiology Office Note   Date:  05/24/2015   ID:  Troy Combs, DOB October 16, 1931, MRN [id number]  PCP:  [name], MD  Cardiologist:  Dr [name] Primary Electrophysiologist: [name], MD    Chief Complaint  Patient presents with  . PVCs     History of Present Illness: Troy Combs is a 82 y.o. male who presents today for electrophysiology evaluation.   The patient has had PVCs for which some time.  Typically he has tolerated these well.   He has a h/o orthostatic hypotension also for which he previously has seen Dr Lovena Le.  The patient has occasional fatigue and dizziness.  These are stable.  He has a preserved EF.  He has been treated previously with beta blockers which he has tolerated.  Currently he does not appear to have significant bradycardia.  He does have frequent PVCs as documented by his recent holter monitor.  Today, he denies symptoms of chest pain, shortness of breath, orthopnea, PND, lower extremity edema, claudication, presyncope, syncope, bleeding, or neurologic sequela. The patient is tolerating medications without difficulties and is otherwise without complaint today.    Past Medical History  Diagnosis Date  . Old myocardial infarction   . Unspecified essential hypertension   . Personal history of other diseases of digestive system   . Personal history of malignant melanoma of skin   . Dysthymic disorder   . Anemia, unspecified   . Thoracic or lumbosacral neuritis or radiculitis, unspecified   . Impotence of organic origin   . Malignant melanoma of skin of upper limb, including shoulder ([place])   . Bladder neck obstruction   . Coronary atherosclerosis of unspecified type of vessel, native or graft   . Impaired glucose tolerance 12/02/2012  . Allergic rhinitis 09/20/2014   Past Surgical History  Procedure Laterality Date  . Arthrodesis  07/06/2002    of left long finger distal interphalangeal joint with Kirschner wire fixation X 3  .  Coronary artery bypass graft  10/17/2000    Troy Combs. Troy Snare, MD  . Atrial ablation surgery  05/04/2001    Dr. [name]  . Coronary angioplasty  1993  . Hernia repair      BILATERAL  . Cholecystectomy    . Exploratory laparotomy      AFTER LIVER TRAUMA AND HAND SURGERY  . Cardiac catheterization  09/05/2008    Revealing 4 of 4 patent grafts with native multivessel coronary artery disease, EF  of 60% without regional wall motion abnormalities.     Current Outpatient Prescriptions  Medication Sig Dispense Refill  . albuterol (PROVENTIL HFA;VENTOLIN HFA) 108 (90 BASE) MCG/ACT inhaler Inhale 2 puffs into the lungs every 6 (six) hours as needed for wheezing or shortness of breath. 1 Inhaler 11  . aspirin 81 MG tablet Take 81 mg by mouth daily.    . carvedilol (COREG) 3.125 MG tablet Take 1 tablet (3.125 mg total) by mouth 2 (two) times daily with a meal. 180 tablet 3  . cetirizine (ZYRTEC) 10 MG tablet Take 1 tablet (10 mg total) by mouth daily. 30 tablet 11  . finasteride (PROSCAR) 5 MG tablet Take 5 mg by mouth daily.    . nitroGLYCERIN (NITROSTAT) 0.4 MG SL tablet Place 0.4 mg under the tongue every 5 (five) minutes as needed for chest pain (x 3 doses).    . rosuvastatin (CRESTOR) 10 MG tablet Take 10 mg by mouth daily.    . sertraline (ZOLOFT) 50 MG tablet Take  50 mg by mouth daily.    . tamsulosin (FLOMAX) 0.4 MG CAPS capsule Take 0.4 mg by mouth daily after supper.    . nortriptyline (PAMELOR) 25 MG capsule TAKE 1 CAPSULE AT BEDTIME 90 capsule 0   No current facility-administered medications for this visit.    Allergies:   Iohexol and Ciprofloxacin   Social History:  The patient  reports that he quit smoking about 59 years ago. He has never used smokeless tobacco. He reports that he drinks alcohol. He reports that he does not use illicit drugs.   Family History:  The patient's  family history includes Heart disease in his father. There is no history of Ataxia, Chorea,  Dementia, Mental retardation, Migraines, Multiple sclerosis, Neurofibromatosis, Neuropathy, Parkinsonism, Seizures, or Stroke.    ROS:  Please see the history of present illness.   All other systems are reviewed and negative.    PHYSICAL EXAM: VS:  BP 138/80 mmHg  Pulse 78  Ht 5' 9.5" (1.765 m)  Wt 174 lb 12.8 oz (79.289 kg)  BMI 25.45 kg/m2  SpO2 95% , BMI Body mass index is 25.45 kg/(m^2). GEN: elderly , in no acute distress HEENT: normal Neck: no JVD, carotid bruits, or masses Cardiac: RRR with no ectopy today; no murmurs, rubs, or gallops,no edema  Respiratory:  clear to auscultation bilaterally, normal work of breathing GI: soft, nontender, nondistended, + BS MS: no deformity or atrophy Skin: warm and dry  Neuro:  Strength and sensation are intact Psych: euthymic mood, full affect  EKG:  EKG 05/04/15 is reviewed and reveals sinus rhythm with occasional PVCs.  PVCs are of a RBB/LAHB morpholgy   Recent Labs: 01/25/2015: ALT 18 05/04/2015: BUN 21; Creat 0.87; Hemoglobin 15.8; Magnesium 2.0; Platelets 164; Potassium 4.2; Sodium 139; TSH 2.933    Lipid Panel     Component Value Date/Time   CHOL 100 01/25/2015 1550   TRIG 167.0* 01/25/2015 1550   TRIG 83 03/18/2006 0839   HDL 42.40 01/25/2015 1550   CHOLHDL 2 01/25/2015 1550   CHOLHDL 2.9 CALC 03/18/2006 0839   VLDL 33.4 01/25/2015 1550   LDLCALC 24 01/25/2015 1550     Wt Readings from Last 3 Encounters:  05/11/15 174 lb 12.8 oz (79.289 kg)  03/03/15 175 lb 4 oz (79.493 kg)  01/25/15 172 lb (78.019 kg)      Other studies Reviewed: Additional studies/ records that were reviewed today include: [place] note, Dr Francesca Oman notes, prior ekgs, echo, and event/ holter monitors    ASSESSMENT AND PLAN:  1.  PVCs The patient has frequent PVCs.  PVCs are of a RBB/LAHB morphology and likely arise from the posterior papillary muscle.   EF is preserved.  Symptoms are really difficult to ascertain as he has additional  issues with orthostatic hypotension and other age related comorbidities.   I think that he would be a poor candidate for additional medicine changes as these are likely to cause more symptoms.  We did discuss ablation today, however given his advanced age, I think that we should avoid this unless his symptoms worsen or his EF declines. I have reviewed his event monitor and ekgs and do not see brady arrhythmia which would warrant pacing at this time. No additional EP workup or management is therefore recommended at this time.  Continue current medicines.  Follow-up with Dr [name] as scheduled I will see as needed going forward.  Current medicines are reviewed at length with the patient today.   The patient  does not have concerns regarding his medicines.  The following changes were made today:  none   Signed, [name], MD     [place] [id number] [phone] (office) [phone] (fax)

## 2015-06-15 ENCOUNTER — Ambulatory Visit (INDEPENDENT_AMBULATORY_CARE_PROVIDER_SITE_OTHER): Payer: Medicare Other | Admitting: Neurology

## 2015-06-15 ENCOUNTER — Other Ambulatory Visit (INDEPENDENT_AMBULATORY_CARE_PROVIDER_SITE_OTHER): Payer: Medicare Other

## 2015-06-15 ENCOUNTER — Encounter: Payer: Self-pay | Admitting: Neurology

## 2015-06-15 VITALS — BP 138/82 | HR 80 | Ht 70.0 in | Wt 178.0 lb

## 2015-06-15 DIAGNOSIS — R202 Paresthesia of skin: Secondary | ICD-10-CM | POA: Diagnosis not present

## 2015-06-15 DIAGNOSIS — I951 Orthostatic hypotension: Secondary | ICD-10-CM

## 2015-06-15 DIAGNOSIS — G629 Polyneuropathy, unspecified: Secondary | ICD-10-CM

## 2015-06-15 LAB — SEDIMENTATION RATE: Sed Rate: 6 mm/hr (ref 0–22)

## 2015-06-15 LAB — VITAMIN B12: Vitamin B-12: 241 pg/mL (ref 211–911)

## 2015-06-15 NOTE — Progress Notes (Signed)
NEUROLOGY FOLLOW UP OFFICE NOTE  [place] [id number]  HISTORY OF PRESENT ILLNESS: I had the pleasure of seeing Troy Combs in follow-up in the neurology clinic on 06/15/2015.  The patient was last seen 3 months ago for vertigo and headaches. The vertigo has not recurred since vestibular rehab. Records and images were personally reviewed where available.  He has been to outpatient rehab for balance therapy and has met most of long-term goals, still with fluctuations in balance/steadiness during ambulation with feeling of lightheadedness suggestive of orthostatic hypotension. He finished PT last December and was advised to use a cane, but would rather use his walking stick for now. He denies any falls. He continues to have tingling in both feet, numbness and tingling in the last 2 digits of both hands. He denies any weakness but reports his right knee buckled recently with right knee pain. He denies any headaches, vision changes, dysarthria/dysphagia, bowel/bladder dysfunction.   HPI: This is a pleasant 82 yo RH man who presented for vertigo and headaches. He had been previously seen by one of my partners, Dr. [name] in 2014 for cervicogenic headaches. He had vertigo that started in April/May 2016. He would have a sensation of tilting when he sat on the edge of the bed, with violent vomiting. He was prescribed meclizine, which would help sometimes. There was concern this was due to his beta-blocker, and after adjustment, he reported the intense dizziness went away, but he still had dizziness if moving too quickly, turning his head, or standing up. He was in the ER in September 2016 due to significant vertigo with dull headache. He had an MRI brain without contrast which I personally reviewed, no acute changes, there was chronic microvascular disease and generalized atrophy. He saw his PCP and was referred for vestibular rehab. He reports that vestibular rehab worked almost immediately. The  dizziness has since resolved. He has a little lightheadedness and feels off balance and unsteady, no falls. He has occasional tingling and numbness in the last 3 digits of both feet, and occasionally pins and needles sensation in the soles of his feet. He has had monocular vertical diplopia for many years, and noticed that this was less when he takes nortriptyline. He had been having occipital headaches, but reported those are pretty much gone, with 1 to 2 over 10 pain, no associated nausea, vomiting, photo/phonophobia. He has some neck crepitus, "like gravel when turning my head," and occasional low back pain. He has some constipation. No family history of similar symptoms.  PAST MEDICAL HISTORY: Past Medical History  Diagnosis Date  . Old myocardial infarction   . Unspecified essential hypertension   . Personal history of other diseases of digestive system   . Personal history of malignant melanoma of skin   . Dysthymic disorder   . Anemia, unspecified   . Thoracic or lumbosacral neuritis or radiculitis, unspecified   . Impotence of organic origin   . Malignant melanoma of skin of upper limb, including shoulder ([place])   . Bladder neck obstruction   . Coronary atherosclerosis of unspecified type of vessel, native or graft   . Impaired glucose tolerance 12/02/2012  . Allergic rhinitis 09/20/2014    MEDICATIONS: Current Outpatient Prescriptions on File Prior to Visit  Medication Sig Dispense Refill  . albuterol (PROVENTIL HFA;VENTOLIN HFA) 108 (90 BASE) MCG/ACT inhaler Inhale 2 puffs into the lungs every 6 (six) hours as needed for wheezing or shortness of breath. 1 Inhaler 11  . aspirin 81  MG tablet Take 81 mg by mouth daily.    . carvedilol (COREG) 3.125 MG tablet Take 1 tablet (3.125 mg total) by mouth 2 (two) times daily with a meal. 180 tablet 3  . cetirizine (ZYRTEC) 10 MG tablet Take 1 tablet (10 mg total) by mouth daily. 30 tablet 11  . finasteride (PROSCAR) 5 MG tablet Take 5 mg by  mouth daily.    . nitroGLYCERIN (NITROSTAT) 0.4 MG SL tablet Place 0.4 mg under the tongue every 5 (five) minutes as needed for chest pain (x 3 doses).    . nortriptyline (PAMELOR) 25 MG capsule TAKE 1 CAPSULE AT BEDTIME 90 capsule 0  . rosuvastatin (CRESTOR) 10 MG tablet Take 10 mg by mouth daily.    . sertraline (ZOLOFT) 50 MG tablet Take 50 mg by mouth daily.    . tamsulosin (FLOMAX) 0.4 MG CAPS capsule Take 0.4 mg by mouth daily after supper.     No current facility-administered medications on file prior to visit.    ALLERGIES: Allergies  Allergen Reactions  . Iohexol Other (See Comments)     Code: HIVES, Desc: PATENT STATES HE IS ALLERGIC TO IV DYE 09/14/08/RM, Onset Date: [id number]   . Ciprofloxacin Other (See Comments)    Patient unsure just know he has a reaction to med    FAMILY HISTORY: Family History  Problem Relation Age of Onset  . Heart disease Father   . Ataxia Neg Hx   . Chorea Neg Hx   . Dementia Neg Hx   . Mental retardation Neg Hx   . Migraines Neg Hx   . Multiple sclerosis Neg Hx   . Neurofibromatosis Neg Hx   . Neuropathy Neg Hx   . Parkinsonism Neg Hx   . Seizures Neg Hx   . Stroke Neg Hx     SOCIAL HISTORY: Social History   Social History  . Marital Status: Married    Spouse Name: N/A  . Number of Children: N/A  . Years of Education: N/A   Occupational History  . Not on file.   Social History Main Topics  . Smoking status: Former Smoker    Quit date: 09/21/1955  . Smokeless tobacco: Never Used  . Alcohol Use: 0.0 oz/week    0 Standard drinks or equivalent per week     Comment: one or two ounces a week  . Drug Use: No  . Sexual Activity: Not on file   Other Topics Concern  . Not on file   Social History Narrative   Lives with wife in a one story home.  Has 5 children.  Retired Freight forwarder for [employer].  Education: 2 years of college.     REVIEW OF SYSTEMS: Constitutional: No fevers, chills, or sweats, no generalized fatigue, change in  appetite Eyes: No visual changes, double vision, eye pain Ear, nose and throat: No hearing loss, ear pain, nasal congestion, sore throat Cardiovascular: No chest pain, palpitations Respiratory:  No shortness of breath at rest or with exertion, wheezes GastrointestinaI: No nausea, vomiting, diarrhea, abdominal pain, fecal incontinence Genitourinary:  No dysuria, urinary retention or frequency Musculoskeletal:  No neck pain, back pain Integumentary: No rash, pruritus, skin lesions Neurological: as above Psychiatric: No depression, insomnia, anxiety Endocrine: No palpitations, fatigue, diaphoresis, mood swings, change in appetite, change in weight, increased thirst Hematologic/Lymphatic:  No anemia, purpura, petechiae. Allergic/Immunologic: no itchy/runny eyes, nasal congestion, recent allergic reactions, rashes  PHYSICAL EXAM: Filed Vitals:   06/15/15 0834  BP: 138/82  Pulse: 80  General: No acute distress Head:  Normocephalic/atraumatic Neck: supple, no paraspinal tenderness, full range of motion Heart:  Regular rate and rhythm Lungs:  Clear to auscultation bilaterally Back: No paraspinal tenderness Skin/Extremities: No rash, no edema Neurological Exam: alert and oriented to person, place, and time. No aphasia or dysarthria. Fund of knowledge is appropriate.  Recent and remote memory are intact.  Attention and concentration are normal.    Able to name objects and repeat phrases. Cranial nerves: Pupils equal, round, reactive to light.  Fundoscopic exam unremarkable, no papilledema. Extraocular movements intact with no nystagmus. Visual fields full. Facial sensation intact. No facial asymmetry. Tongue, uvula, palate midline.  Motor: Bulk and tone normal, muscle strength 5/5 throughout with no pronator drift.  Sensation intact to pin on both UE, decreased on right calf (chronic since heart surgery), decreased vibration to both ankles. Deep tendon reflexes +1 on both UE and patella, unable to  elicit in both ankles. Toes downgoing.  Finger to nose testing intact.  Gait slow and cautious, no ataxia, mild difficulty with tandem walk but able. Romberg positive sway.  IMPRESSION: This is a pleasant 82 yo RH man with a history of hypertension, CAD s/p MI and CABG, orthostatic hypotension, cervicogenic headaches, who presented for vertigo and unsteadiness. The vertigo has not recurred since he underwent vestibular rehab for right BPPV. He still has a sensation of lightheadedness/unsteadiness, likely  multifactorial from history of orthostatic hypotension, as well as mild sensory ataxia due to peripheral neuropathy. He had not done neuropathy labs from last visit and will be ordered today. We discussed doing an EMG/NCV and have agreed to hold off for now. He does not want to start any neuropathy medications such as gabapentin at this point. We discussed orthostatic hypotension, he will continue follow-up with Cardiology, try compression stockings, increase fluid intake and liberalize salt intake. He will follow-up in 7 months and knows to call for any changes.   Thank you for allowing me to participate in his care.  Please do not hesitate to call for any questions or concerns.  The duration of this appointment visit was 25 minutes of face-to-face time with the patient.  Greater than 50% of this time was spent in counseling, explanation of diagnosis, planning of further management, and coordination of care.   Ellouise Newer, M.D.   CC: Dr. Jenny Reichmann, Dr. [name]

## 2015-06-15 NOTE — Patient Instructions (Addendum)
1. Bloodowork for B12, ESR, methylmalonic acid, RPR, SPEP/IFE 2. Try using compression stockings to help with orthostatic hypotension. Increase fluid intake 3. Use walking stick for balance 4. Follow-up in 7 months

## 2015-06-16 LAB — RPR

## 2015-06-18 LAB — METHYLMALONIC ACID, SERUM: Methylmalonic Acid, Quant: 186 nmol/L (ref 87–318)

## 2015-06-19 LAB — IMMUNOFIXATION ELECTROPHORESIS
IgA: 104 mg/dL (ref 68–379)
IgG (Immunoglobin G), Serum: 625 mg/dL — ABNORMAL LOW (ref 650–1600)
IgM, Serum: 94 mg/dL (ref 41–251)

## 2015-06-19 LAB — PROTEIN ELECTROPHORESIS, SERUM
Albumin ELP: 4.1 g/dL (ref 3.8–4.8)
Alpha-1-Globulin: 0.3 g/dL (ref 0.2–0.3)
Alpha-2-Globulin: 0.8 g/dL (ref 0.5–0.9)
Beta 2: 0.2 g/dL (ref 0.2–0.5)
Beta Globulin: 0.4 g/dL (ref 0.4–0.6)
Gamma Globulin: 0.7 g/dL — ABNORMAL LOW (ref 0.8–1.7)
Total Protein, Serum Electrophoresis: 6.4 g/dL (ref 6.1–8.1)

## 2015-06-21 ENCOUNTER — Telehealth: Payer: Self-pay | Admitting: Family Medicine

## 2015-06-21 NOTE — Telephone Encounter (Signed)
Patient notified of results. Patient did mention that when he looked on my chart and saw his results he noticed that his b12 was on the lower end of normal. I did tell patient that he could start a daily otc b12 supplement of 1040mcg.

## 2015-06-21 NOTE — Telephone Encounter (Signed)
-----   Message from Cameron Sprang, MD sent at 06/21/2015  2:40 PM EST ----- Pls let him know bloodwork is unremarkable, thyroid and B12 are within range, no evidence of inflammation. Thanks

## 2015-07-07 ENCOUNTER — Other Ambulatory Visit: Payer: Self-pay | Admitting: Internal Medicine

## 2015-07-18 ENCOUNTER — Other Ambulatory Visit: Payer: Self-pay | Admitting: Internal Medicine

## 2015-09-03 ENCOUNTER — Other Ambulatory Visit: Payer: Self-pay | Admitting: Internal Medicine

## 2015-09-15 ENCOUNTER — Other Ambulatory Visit: Payer: Self-pay | Admitting: Internal Medicine

## 2015-10-05 ENCOUNTER — Other Ambulatory Visit: Payer: Self-pay | Admitting: Internal Medicine

## 2015-10-18 ENCOUNTER — Telehealth: Payer: Self-pay | Admitting: Internal Medicine

## 2015-10-18 ENCOUNTER — Telehealth: Payer: Self-pay | Admitting: Neurology

## 2015-10-18 DIAGNOSIS — R42 Dizziness and giddiness: Secondary | ICD-10-CM

## 2015-10-18 MED ORDER — ONDANSETRON HCL 4 MG PO TABS: 4.0000 mg | ORAL_TABLET | Freq: Three times a day (TID) | ORAL | Status: DC | PRN

## 2015-10-18 MED ORDER — MECLIZINE HCL 12.5 MG PO TABS: 12.5000 mg | ORAL_TABLET | Freq: Three times a day (TID) | ORAL | Status: DC | PRN

## 2015-10-18 NOTE — Telephone Encounter (Signed)
Sure, but if not better need to let us know and if develops anything focal/lateralizing will need neuroimaging

## 2015-10-18 NOTE — Telephone Encounter (Signed)
Please advise 

## 2015-10-18 NOTE — Telephone Encounter (Signed)
Troy Combs 2032/02/13. His wife called this morning. He woke up with Vertigo. He was seen for it before by his primary Dr. [name]. She has been trying to contact his office but they are experiencing phone problems. I did tell her Dr. Delice Lesch is out of the office and she has not seen him for this problem. I did advise them to continue trying to get in touch with his PCP. His number is [phone]. Thank you

## 2015-10-18 NOTE — Telephone Encounter (Signed)
Spoke with patient's wife who did get in contact with Dr. Gwynn Burly office. She states patient's blood pressure is good. He is in bed resting. He has not had an episode of vertigo since completing vestibular rehab around [place] of last year. Okay to place order for vestibular rehab?

## 2015-10-18 NOTE — Telephone Encounter (Signed)
Patient's wife made aware. Referral made.  

## 2015-10-18 NOTE — Telephone Encounter (Signed)
States he has came down with vertigo again.  States Dr. Jenny Reichmann has treated this before.  Is requesting meds(mescaline? and zofran) to be sent to [facility].  Is also requesting a referral to [facility] to Lyndee Hensen for PT.  Please follow back up.

## 2015-10-18 NOTE — Telephone Encounter (Signed)
[place] for med and neurology

## 2015-10-18 NOTE — Addendum Note (Signed)
Addended by: Biagio Borg on: 10/18/2015 07:04 PM   Modules accepted: Orders

## 2015-10-19 NOTE — Telephone Encounter (Signed)
Patient aware.

## 2015-10-26 ENCOUNTER — Telehealth: Payer: Self-pay | Admitting: Internal Medicine

## 2015-10-26 ENCOUNTER — Ambulatory Visit: Payer: Medicare Other | Attending: Neurology | Admitting: Physical Therapy

## 2015-10-26 DIAGNOSIS — R42 Dizziness and giddiness: Secondary | ICD-10-CM | POA: Insufficient documentation

## 2015-10-28 NOTE — Patient Instructions (Signed)
Benign Positional Vertigo Vertigo is the feeling that you or your surroundings are moving when they are not. Benign positional vertigo is the most common form of vertigo. The cause of this condition is not serious (is benign). This condition is triggered by certain movements and positions (is positional). This condition can be dangerous if it occurs while you are doing something that could endanger you or others, such as driving.  CAUSES In many cases, the cause of this condition is not known. It may be caused by a disturbance in an area of the inner ear that helps your brain to sense movement and balance. This disturbance can be caused by a viral infection (labyrinthitis), head injury, or repetitive motion. RISK FACTORS This condition is more likely to develop in:  Women.  People who are 50 years of age or older. SYMPTOMS Symptoms of this condition usually happen when you move your head or your eyes in different directions. Symptoms may start suddenly, and they usually last for less than a minute. Symptoms may include:  Loss of balance and falling.  Feeling like you are spinning or moving.  Feeling like your surroundings are spinning or moving.  Nausea and vomiting.  Blurred vision.  Dizziness.  Involuntary eye movement (nystagmus). Symptoms can be mild and cause only slight annoyance, or they can be severe and interfere with daily life. Episodes of benign positional vertigo may return (recur) over time, and they may be triggered by certain movements. Symptoms may improve over time. DIAGNOSIS This condition is usually diagnosed by medical history and a physical exam of the head, neck, and ears. You may be referred to a health care provider who specializes in ear, nose, and throat (ENT) problems (otolaryngologist) or a provider who specializes in disorders of the nervous system (neurologist). You may have additional testing, including:  MRI.  A CT scan.  Eye movement tests. Your  health care provider may ask you to change positions quickly while he or she watches you for symptoms of benign positional vertigo, such as nystagmus. Eye movement may be tested with an electronystagmogram (ENG), caloric stimulation, the Dix-Hallpike test, or the roll test.  An electroencephalogram (EEG). This records electrical activity in your brain.  Hearing tests. TREATMENT Usually, your health care provider will treat this by moving your head in specific positions to adjust your inner ear back to normal. Surgery may be needed in severe cases, but this is rare. In some cases, benign positional vertigo may resolve on its own in 2-4 weeks. HOME CARE INSTRUCTIONS Safety  Move slowly.Avoid sudden body or head movements.  Avoid driving.  Avoid operating heavy machinery.  Avoid doing any tasks that would be dangerous to you or others if a vertigo episode would occur.  If you have trouble walking or keeping your balance, try using a cane for stability. If you feel dizzy or unstable, sit down right away.  Return to your normal activities as told by your health care provider. Ask your health care provider what activities are safe for you. General Instructions  Take over-the-counter and prescription medicines only as told by your health care provider.  Avoid certain positions or movements as told by your health care provider.  Drink enough fluid to keep your urine clear or pale yellow.  Keep all follow-up visits as told by your health care provider. This is important. SEEK MEDICAL CARE IF:  You have a fever.  Your condition gets worse or you develop new symptoms.  Your family or friends   notice any behavioral changes.  Your nausea or vomiting gets worse.  You have numbness or a "pins and needles" sensation. SEEK IMMEDIATE MEDICAL CARE IF:  You have difficulty speaking or moving.  You are always dizzy.  You faint.  You develop severe headaches.  You have weakness in your  legs or arms.  You have changes in your hearing or vision.  You develop a stiff neck.  You develop sensitivity to light.   This information is not intended to replace advice given to you by your health care provider. Make sure you discuss any questions you have with your health care provider.   Document Released: 01/21/2006 Document Revised: 01/04/2015 Document Reviewed: 08/08/2014 Elsevier Interactive Patient Education 2016 [employer]  

## 2015-10-28 NOTE — Therapy (Signed)
[facility] [address] [place], [place], [address] Phone: [phone]   Fax:  [phone]  Physical Therapy Evaluation  Patient Details  Name: Troy Combs MRN: [id number] Date of Birth: 1932/01/26 Referring Provider: Dr. [name]  Encounter Date: 10/26/2015      PT End of Session - 10/28/15 1247    Visit Number 1   Number of Visits 3   Authorization Type [employer] Medicare   Authorization Time Period 10-26-15 - 12-25-15   PT Start Time 1231   PT Stop Time 1315   PT Time Calculation (min) 44 min      Past Medical History  Diagnosis Date  . Old myocardial infarction   . Unspecified essential hypertension   . Personal history of other diseases of digestive system   . Personal history of malignant melanoma of skin   . Dysthymic disorder   . Anemia, unspecified   . Thoracic or lumbosacral neuritis or radiculitis, unspecified   . Impotence of organic origin   . Malignant melanoma of skin of upper limb, including shoulder ([place])   . Bladder neck obstruction   . Coronary atherosclerosis of unspecified type of vessel, native or graft   . Impaired glucose tolerance 12/02/2012  . Allergic rhinitis 09/20/2014    Past Surgical History  Procedure Laterality Date  . Arthrodesis  07/06/2002    of left long finger distal interphalangeal joint with Kirschner wire fixation X 3  . Coronary artery bypass graft  10/17/2000    [name]. [name], MD  . Atrial ablation surgery  05/04/2001    Dr. [name]  . Coronary angioplasty  1993  . Hernia repair      BILATERAL  . Cholecystectomy    . Exploratory laparotomy      AFTER LIVER TRAUMA AND HAND SURGERY  . Cardiac catheterization  09/05/2008    Revealing 4 of 4 patent grafts with native multivessel coronary artery disease, EF  of 60% without regional wall motion abnormalities.    There were no vitals filed for this visit.           [facility] PT Assessment - 10/28/15  0001    Assessment   Medical Diagnosis Vertigo   Referring Provider Dr. [name]   Onset Date/Surgical Date 10/18/15   Prior Therapy at this facility in 2016 - for vertigc and imbalance   Precautions   Precautions None   Balance Screen   Has the patient fallen in the past 6 months No   Has the patient had a decrease in activity level because of a fear of falling?  No   Is the patient reluctant to leave their home because of a fear of falling?  No   Ambulation/Gait   Ambulation/Gait Yes   Ambulation/Gait Assistance 6: Modified independent (Device/Increase time)   Assistive device Other (Comment)  walking pole   Gait Pattern Within Functional Limits            Vestibular Assessment - 10/28/15 0001    Vestibular Assessment   General Observation Pt is an 82 year old gentleman who presents with c/o vertigo (much imporved at present time) that started on Wed. , 10-18-15 upon awakening and turning over to get OOB   Symptom Behavior   Type of Dizziness Lightheadedness  initially - has not had this sensation in past 2 days   Frequency of Dizziness varies  much less frequent at this time than last week   Duration of Dizziness  seconds   Aggravating Factors Rolling to right;Rolling to left;Forward bending   Relieving Factors Lying supine   Occulomotor Exam   Occulomotor Alignment Normal   Positional Testing   Dix-Hallpike Dix-Hallpike Right;Dix-Hallpike Left   Sidelying Test Sidelying Right;Sidelying Left   Dix-Hallpike Right   Dix-Hallpike Right Duration None   Dix-Hallpike Right Symptoms No nystagmus   Dix-Hallpike Left   Dix-Hallpike Left Duration None   Dix-Hallpike Left Symptoms No nystagmus   Sidelying Right   Sidelying Right Duration None   Sidelying Right Symptoms No nystagmus   Sidelying Left   Sidelying Left Duration None   Sidelying Left Symptoms No nystagmus                       PT Education - 10/28/15 1246    Education provided Yes    Education Details etiology of BPPV   Person(s) Educated Patient;Spouse   Methods Explanation;Handout   Comprehension Verbalized understanding                    Plan - 10/28/15 1249    Clinical Impression Statement Pt is an 82 year old gentleman with symptoms of BPPV initially that appears to have resolved since onset last Wed., 10-18-15.  No nystagmus or c/o vertigo provoked with any positional testing today.  Pt reports no new problems with balance; will place pt on HOLD for 3-4 weeks should vertigo re-occurence happen. PT not warranted at this time due to no signs or symptoms of BPPV.                                                Rehab Potential Good   PT Frequency 3x / week  if needed - otherwise will D/C   PT Duration 3 weeks   PT Treatment/Interventions Canalith Repostioning;Patient/family education;ADLs/Self Care Home Management   PT Next Visit Plan Not needed unless BPPV re-occurs   PT Home Exercise Plan pt instructed in Brandt-Daroff exercises should BPPV re-occur   Consulted and Agree with Plan of Care Patient;Family member/caregiver   Family Member Consulted wife [name]      Patient will benefit from skilled therapeutic intervention in order to improve the following deficits and impairments:  Dizziness  Visit Diagnosis: Dizziness and giddiness - Plan: PT plan of care cert/re-cert     Problem List Patient Active Problem List   Diagnosis Date Noted  . PVC's (premature ventricular contractions) 05/24/2015  . Peripheral polyneuropathy ([place]) 03/06/2015  . BPPV (benign paroxysmal positional vertigo) 03/06/2015  . Headache 01/18/2015  . Vertigo 01/18/2015  . Allergic rhinitis 09/20/2014  . DOE (dyspnea on exertion) 08/10/2014  . Severe dizziness 08/10/2014  . Acute diverticulitis 05/18/2014  . Diarrhea 05/18/2014  . LLQ pain 05/13/2014  . Chronic meniscal tear of knee 09/22/2013  . Primary localized osteoarthrosis, lower leg 09/22/2013  . Neck pain,  bilateral 12/18/2012  . Headache(784.0) 12/18/2012  . Vision changes 12/18/2012  . Preventative health care 12/02/2012  . Impaired glucose tolerance 12/02/2012  . Low back pain 12/02/2012  . Palpitations 10/09/2010  . B12 deficiency 08/30/2010  . Hearing loss 08/30/2010  . Tinnitus 08/30/2010  . Orthostatic hypotension 06/28/2009  . HYPERLIPIDEMIA TYPE IIB / III 05/15/2009  . CHEST PAIN UNSPECIFIED 11/23/2008  . Hypotension, unspecified 10/12/2008  . DIZZINESS 10/12/2008  . Shortness of breath 09/14/2008  .  UNSPECIFIED ANEMIA 09/13/2008  . ANXIETY DEPRESSION 09/13/2008  . Old myocardial infarction 09/13/2008  . GASTROESOPHAGEAL REFLUX DISEASE, HX OF 09/13/2008  . LUMBAR RADICULOPATHY, LEFT 08/23/2008  . MELANOMA, SHOULDER 11/27/2006  . Essential hypertension 11/27/2006  . Coronary atherosclerosis 11/27/2006  . OBSTRUCTION, BLADDER NECK 11/27/2006  . ERECTILE DYSFUNCTION, ORGANIC 11/27/2006    Alda Lea, PT 10/28/2015, 12:59 PM  [facility] [address] [place], [place], [address] Phone: [phone]   Fax:  [phone]  Name: JARROLD KOHLMAN MRN: [id number] Date of Birth: [birth year]

## 2015-11-09 ENCOUNTER — Ambulatory Visit (INDEPENDENT_AMBULATORY_CARE_PROVIDER_SITE_OTHER): Payer: Medicare Other | Admitting: Cardiology

## 2015-11-09 ENCOUNTER — Encounter: Payer: Self-pay | Admitting: Cardiology

## 2015-11-09 VITALS — BP 132/72 | HR 80 | Ht 70.0 in | Wt 176.0 lb

## 2015-11-09 DIAGNOSIS — Z951 Presence of aortocoronary bypass graft: Secondary | ICD-10-CM

## 2015-11-09 DIAGNOSIS — E782 Mixed hyperlipidemia: Secondary | ICD-10-CM | POA: Diagnosis not present

## 2015-11-09 DIAGNOSIS — I10 Essential (primary) hypertension: Secondary | ICD-10-CM

## 2015-11-09 DIAGNOSIS — I4589 Other specified conduction disorders: Secondary | ICD-10-CM

## 2015-11-09 DIAGNOSIS — I25708 Atherosclerosis of coronary artery bypass graft(s), unspecified, with other forms of angina pectoris: Secondary | ICD-10-CM

## 2015-11-09 DIAGNOSIS — R42 Dizziness and giddiness: Secondary | ICD-10-CM | POA: Diagnosis not present

## 2015-11-09 MED ORDER — NITROGLYCERIN 0.4 MG SL SUBL: 0.4000 mg | SUBLINGUAL_TABLET | SUBLINGUAL | Status: DC | PRN

## 2015-11-09 NOTE — Progress Notes (Signed)
Patient ID: Troy Combs, male   DOB: 02-23-1932, 82 y.o.   MRN: [id number]      Cardiology Office Note   Date:  11/09/2015   ID:  Troy Combs, DOB 1932/02/01, MRN [id number]  PCP:  [name], MD  Cardiologist:  Former patient of Dr. 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 The last cath in 5/10 with patent L-LAD, S-RCA, S-OM1/OM2, EF 60%, atrial flutter, known orthostatic hypotension.  His last echo in 4/12: Ef 55-65%, grade 1 diast dysfxn, mild focal basal hypertrophy of the septum, mild LAE. Last myoview 8/10: apical thinning with mild apical ischemia, EF 65% (low risk).  In the past his Toprol XL was increased to 100 mg PO daily for symptomatic PVCs. Recently decreased to 50 mg po daily for fatigue and dizziness.  He comes today and denies any chest pain, however he exercises on almost daily basis and has noticed that he gets profoundly more SOB on exertion and also feels dizzy.  He has known orthostatic hypotension, he was advised to increase fluid and salt intake in the past but he hasn't been doing it. He doesn't like to drink water and drinks a lot coffee. No syncope. One one occasion he noticed short episode of HR 150 BPM but no presyncope of syncope.   08/25/14 - normal stress test, however chronotropic incompetence and switched to lexiscan,  normal echo, continues to have the same symptoms of dizziness about 1 minute after he starts walking and exertional dizziness.  11/09/2015 - the patient is coming after one year, the last visit he was discontinued on metoprolol but also was diagnosed with vertigo and started meclizine that resolved most of his symptoms however still experiencing some dizziness. He denies any syncope. No chest  pain, he hasn't used nitroglycerin several years. No lower extremity edema, claudication orthopnea or personal nocturnal dyspnea.  Past Medical History  Diagnosis Date  . Old myocardial infarction   . Unspecified essential hypertension   . Personal history of other diseases of digestive system   . Personal history of malignant melanoma of skin   . Dysthymic disorder   . Anemia, unspecified   . Thoracic or lumbosacral neuritis or radiculitis, unspecified   . Impotence of organic origin   . Malignant melanoma of skin of upper limb, including shoulder ([place])   . Bladder neck obstruction   . Coronary atherosclerosis of unspecified type of vessel, native or graft   . Impaired glucose tolerance 12/02/2012  . Allergic rhinitis 09/20/2014    Past Surgical History  Procedure Laterality Date  . Arthrodesis  07/06/2002    of left long finger distal interphalangeal joint with Kirschner wire fixation X 3  . Coronary artery bypass graft  10/17/2000    [name]. [name], MD  . Atrial ablation surgery  05/04/2001    Dr. [name]  . Coronary angioplasty  1993  . Hernia repair      BILATERAL  . Cholecystectomy    . Exploratory laparotomy      AFTER LIVER TRAUMA AND HAND SURGERY  . Cardiac catheterization  09/05/2008    Revealing 4 of 4 patent grafts with native multivessel coronary artery disease, EF  of 60% without  regional wall motion abnormalities.     Current Outpatient Prescriptions  Medication Sig Dispense Refill  . albuterol (PROVENTIL HFA;VENTOLIN HFA) 108 (90 BASE) MCG/ACT inhaler Inhale 2 puffs into the lungs every 6 (six) hours as needed for wheezing or shortness of breath. 1 Inhaler 11  . aspirin 81 MG tablet Take 81 mg by mouth daily.    . carvedilol (COREG) 3.125 MG tablet Take 3.125 mg by mouth at bedtime.    . cetirizine (ZYRTEC) 10 MG tablet Take 1 tablet (10 mg total) by mouth daily. 30 tablet 11  . finasteride (PROSCAR) 5 MG tablet Take 5 mg by mouth daily.    .  meclizine (ANTIVERT) 12.5 MG tablet Take 1 tablet (12.5 mg total) by mouth 3 (three) times daily as needed for dizziness. 30 tablet 1  . nitroGLYCERIN (NITROSTAT) 0.4 MG SL tablet Place 0.4 mg under the tongue every 5 (five) minutes as needed for chest pain (x 3 doses). Reported on 06/15/2015    . nortriptyline (PAMELOR) 25 MG capsule TAKE 1 CAPSULE AT BEDTIME 90 capsule 0  . ondansetron (ZOFRAN) 4 MG tablet Take 1 tablet (4 mg total) by mouth every 8 (eight) hours as needed for nausea or vomiting. 30 tablet 0  . rosuvastatin (CRESTOR) 10 MG tablet TAKE 1 TABLET DAILY 90 tablet 0  . sertraline (ZOLOFT) 50 MG tablet TAKE 1 TABLET DAILY 90 tablet 1  . tamsulosin (FLOMAX) 0.4 MG CAPS capsule TAKE 1 CAPSULE DAILY 90 capsule 1   No current facility-administered medications for this visit.    Allergies:   Iohexol and Ciprofloxacin    Social History:  The patient  reports that he quit smoking about 60 years ago. He has never used smokeless tobacco. He reports that he drinks alcohol. He reports that he does not use illicit drugs.   Family History:  The patient's family history includes Heart disease in his father. There is no history of Ataxia, Chorea, Dementia, Mental retardation, Migraines, Multiple sclerosis, Neurofibromatosis, Neuropathy, Parkinsonism, Seizures, or Stroke.   ROS:  Please see the history of present illness. All other systems are reviewed and negative.   PHYSICAL EXAM: VS:  BP 132/72 mmHg  Pulse 80  Ht 5\' 10"  (1.778 m)  Wt 176 lb (79.833 kg)  BMI 25.25 kg/m2 , BMI Body mass index is 25.25 kg/(m^2). GEN: Well nourished, well developed, in no acute distress HEENT: normal Neck: no JVD, carotid bruits, or masses Cardiac: RRR; no murmurs, rubs, or gallops,no edema  Respiratory:  clear to auscultation bilaterally, normal work of breathing GI: soft, nontender, nondistended, + BS MS: no deformity or atrophy Skin: warm and dry, no rash Neuro:  Strength and sensation are  intact Psych: euthymic mood, full affect  EKG:  EKG is ordered today. The ekg ordered today demonstrates SR, normal ECG, HR 63 BPM  Recent Labs: 01/25/2015: ALT 18 05/04/2015: BUN 21; Creat 0.87; Hemoglobin 15.8; Magnesium 2.0; Platelets 164; Potassium 4.2; Sodium 139; TSH 2.933   Lipid Panel    Component Value Date/Time   CHOL 100 01/25/2015 1550   TRIG 167.0* 01/25/2015 1550   TRIG 83 03/18/2006 0839   HDL 42.40 01/25/2015 1550   CHOLHDL 2 01/25/2015 1550   CHOLHDL 2.9 CALC 03/18/2006 0839   VLDL 33.4 01/25/2015 1550   LDLCALC 24 01/25/2015 1550   Wt Readings from Last 3 Encounters:  11/09/15 176 lb (79.833 kg)  06/15/15 178 lb (80.74 kg)  05/11/15 174 lb 12.8 oz (79.289 kg)    Echocardiogram:  08/16/2010 Study Conclusions  - Left ventricle: The cavity size was normal. There was mild focal basal hypertrophy of the septum. Systolic function was normal. The estimated ejection fraction was in the range of 55% to 65%. Wall motion was normal; there were no regional wall motion abnormalities. Doppler parameters are consistent with abnormal left ventricular relaxation (grade 1 diastolic dysfunction). - Aortic valve: Trivial regurgitation. - Mitral valve: Calcified annulus. - Left atrium: The atrium was mildly dilated. - Atrial septum: There was redundancy of the septum, with borderline criteria for aneurysm. - Pulmonary arteries: Systolic pressure was mildly increased. - Pericardium, extracardiac: A trivial pericardial effusion was identified.  Echo: 08/16/2014 Study Conclusions  - Left ventricle: The cavity size was normal. Wall thickness was increased in a pattern of mild LVH. There was mild focal basal hypertrophy of the septum. Systolic function was vigorous. The estimated ejection fraction was in the range of 65% to 70%. Wall motion was normal; there were no regional wall motion abnormalities. Doppler parameters are consistent with  abnormal left ventricular relaxation (grade 1 diastolic dysfunction). - Mitral valve: Calcified annulus. Mild prolapse, involving the anterior leaflet and the posterior leaflet.  Impressions: - Vigorous LV function; grade 1 diastolic dysfunction; mild bileaflet MVP; trace MR and TR.  Lexiscan stress test: Impression Exercise Capacity: Lexiscan with low level exercise. BP Response: Normal blood pressure response. Clinical Symptoms: No chest pain. ECG Impression: No significant ST segment change suggestive of ischemia. Comparison with Prior Nuclear Study:Previous report was low risk scan  Overall Impression: Probable normal perfusion and minimal apical thnning. No evidence for significant ischemia or scar. Overall low risk scan  LV Ejection Fraction: 66%. LV Wall Motion: NL LV Function; NL Wall Motion   EKG performed today 11/09/2015 shows normal sinus rhythm with nonspecific ST abnormalities otherwise normal and unchanged from general fifth 2017.   ASSESSMENT AND PLAN:  Very pleasant, younger appearing 82 year old male  7. CAD, s/p CABG, no ischemia on stress test. EKG unchanged today and he is asymptomatic no testing needed at this point.  - continue ASA, crestor - discontinued toprol XL  2. Exertional dizziness Improved on meclizine and some residual dizziness, we will check ultrasound of his carotids. His 48 hour Holter monitor was negative for any significant pulses or arrhythmias.  3. Frequent PVCs - the extent thousand 48 hour monitor in January 2017, those are asymptomatic no ventricular tachycardias. Normal LVEF so for now we'll just follow.  4. HLP - at goal on crestor 20 mg po daily, we will check lipids and CMP prior to next visit in 6 months.  Follow up in 6 months.   Signed, Ena Dawley, MD  11/09/2015 8:54 AM    [facility] [place], [place], [place]  [address] Phone: [phone]; Fax: [phone]

## 2015-11-09 NOTE — Patient Instructions (Signed)
Medication Instructions:   Your physician recommends that you continue on your current medications as directed. Please refer to the Current Medication list given to you today.    Labwork:  CMET AND LIPIDS PRIOR TO YOUR 6 MONTH FOLLOW-UP APPOINTMENT WITH DR NELSON--PLEASE COME FASTING TO THIS LAB APPOINTMENT    Testing/Procedures:  Your physician has requested that you have a carotid duplex. This test is an ultrasound of the carotid arteries in your neck. It looks at blood flow through these arteries that supply the brain with blood. Allow one hour for this exam. There are no restrictions or special instructions.    Follow-Up:  Your physician wants you to follow-up in: [place] will receive a reminder letter in the mail two months in advance. If you don't receive a letter, please call our office to schedule the follow-up appointment.  PLEASE HAVE YOUR LABS DONE PRIOR TO THIS APPOINTMENT       If you need a refill on your cardiac medications before your next appointment, please call your pharmacy.

## 2015-11-13 ENCOUNTER — Ambulatory Visit (HOSPITAL_COMMUNITY)
Admission: RE | Admit: 2015-11-13 | Discharge: 2015-11-13 | Disposition: A | Discharge status: Home / Self Care | Payer: Medicare Other | Source: Ambulatory Visit | Attending: Cardiology | Admitting: Cardiology

## 2015-11-13 DIAGNOSIS — E782 Mixed hyperlipidemia: Secondary | ICD-10-CM | POA: Insufficient documentation

## 2015-11-13 DIAGNOSIS — R42 Dizziness and giddiness: Secondary | ICD-10-CM | POA: Diagnosis not present

## 2015-11-13 DIAGNOSIS — I10 Essential (primary) hypertension: Secondary | ICD-10-CM | POA: Diagnosis not present

## 2015-11-13 DIAGNOSIS — I6523 Occlusion and stenosis of bilateral carotid arteries: Secondary | ICD-10-CM | POA: Diagnosis not present

## 2015-12-20 ENCOUNTER — Other Ambulatory Visit: Payer: Self-pay | Admitting: *Deleted

## 2015-12-20 MED ORDER — NORTRIPTYLINE HCL 25 MG PO CAPS: 25.0000 mg | ORAL_CAPSULE | Freq: Every day | ORAL | 0 refills | Status: DC

## 2015-12-22 ENCOUNTER — Ambulatory Visit (INDEPENDENT_AMBULATORY_CARE_PROVIDER_SITE_OTHER): Payer: Medicare Other | Admitting: Internal Medicine

## 2015-12-22 VITALS — BP 138/78 | HR 81 | Temp 98.3°F | Resp 20 | Wt 170.0 lb

## 2015-12-22 DIAGNOSIS — I10 Essential (primary) hypertension: Secondary | ICD-10-CM | POA: Diagnosis not present

## 2015-12-22 DIAGNOSIS — M545 Low back pain, unspecified: Secondary | ICD-10-CM

## 2015-12-22 DIAGNOSIS — F341 Dysthymic disorder: Secondary | ICD-10-CM

## 2015-12-22 MED ORDER — HYDROCODONE-ACETAMINOPHEN 10-325 MG PO TABS: 1.0000 | ORAL_TABLET | Freq: Three times a day (TID) | ORAL | 0 refills | Status: DC | PRN

## 2015-12-22 MED ORDER — TIZANIDINE HCL 4 MG PO TABS: 4.0000 mg | ORAL_TABLET | Freq: Four times a day (QID) | ORAL | 1 refills | Status: DC | PRN

## 2015-12-22 MED ORDER — PREDNISONE 10 MG PO TABS: ORAL_TABLET | ORAL | 0 refills | Status: DC

## 2015-12-22 NOTE — Assessment & Plan Note (Signed)
stable overall by history and exam, recent data reviewed with pt, and pt to continue medical treatment as before,  to f/u any worsening symptoms or concerns Lab Results  Component Value Date   WBC 7.6 05/04/2015   HGB 15.8 05/04/2015   HCT 44.9 05/04/2015   PLT 164 05/04/2015   GLUCOSE 106 (H) 05/04/2015   CHOL 100 01/25/2015   TRIG 167.0 (H) 01/25/2015   HDL 42.40 01/25/2015   LDLCALC 24 01/25/2015   ALT 18 01/25/2015   AST 17 01/25/2015   NA 139 05/04/2015   K 4.2 05/04/2015   CL 106 05/04/2015   CREATININE 0.87 05/04/2015   BUN 21 05/04/2015   CO2 24 05/04/2015   TSH 2.933 05/04/2015   PSA 0.48 12/02/2012   INR 1.2 09/04/2008   HGBA1C 5.7 01/25/2015

## 2015-12-22 NOTE — Assessment & Plan Note (Signed)
Mod to severe, improved today somewhat subjectively, most c/w underlying exac of lumbar disc disease, no neuro changes, for vicodin prn refill, muscle relaxer prn,  to f/u any worsening symptoms or concerns

## 2015-12-22 NOTE — Progress Notes (Signed)
Subjective:    Patient ID: Troy Combs, male    DOB: [birth year], 82 y.o.   MRN: [id number]  HPI  Here with c/o 5 days lowest lumbar, sharp, mod to severe at times, constant until maybe slightly better today. Mostly ok with sitting except for 1 particular position, but worse to stand up, and walking very difficult (though better today), also Bending, twisting makes worse as well.  Seemed to start after was standing on short step ladder working on light fixtures for several hours accumulative.  Not better recently with recliner use, or heating pad, Pt did took a vicodin yesterday (rarely uses), now almost out, including muscle relaxer.  Pt denies chest pain, increased sob or doe, wheezing, orthopnea, PND, increased LE swelling, palpitations, dizziness or syncope.  Pt denies new neurological symptoms such as new headache, or facial or extremity weakness or numbness  Pt denies bowel or bladder change, fever, wt loss,  worsening LE pain/numbness/weakness, or falls. Denies worsening depressive symptoms, suicidal ideation, or panic Past Medical History:  Diagnosis Date  . Allergic rhinitis 09/20/2014  . Anemia, unspecified   . Bladder neck obstruction   . Coronary atherosclerosis of unspecified type of vessel, native or graft   . Dysthymic disorder   . Impaired glucose tolerance 12/02/2012  . Impotence of organic origin   . Malignant melanoma of skin of upper limb, including shoulder ([place])   . Old myocardial infarction   . Personal history of malignant melanoma of skin   . Personal history of other diseases of digestive system   . Thoracic or lumbosacral neuritis or radiculitis, unspecified   . Unspecified essential hypertension    Past Surgical History:  Procedure Laterality Date  . ARTHRODESIS  07/06/2002   of left long finger distal interphalangeal joint with Kirschner wire fixation X 3  . ATRIAL ABLATION SURGERY  05/04/2001   Dr. [name]  . CARDIAC CATHETERIZATION  09/05/2008   Revealing 4 of 4 patent grafts with native multivessel coronary artery disease, EF  of 60% without regional wall motion abnormalities.  . CHOLECYSTECTOMY    . CORONARY ANGIOPLASTY  1993  . CORONARY ARTERY BYPASS GRAFT  10/17/2000   [name]. [name], [place]     AFTER LIVER TRAUMA AND HAND SURGERY  . HERNIA REPAIR     BILATERAL    reports that he quit smoking about 60 years ago. He has never used smokeless tobacco. He reports that he drinks alcohol. He reports that he does not use drugs. family history includes Heart disease in his father. Allergies  Allergen Reactions  . Iohexol Other (See Comments)     Code: HIVES, Desc: PATENT STATES HE IS ALLERGIC TO IV DYE 09/14/08/RM, Onset Date: [id number]   . Ciprofloxacin Other (See Comments)    Patient unsure just know he has a reaction to med   Current Outpatient Prescriptions on File Prior to Visit  Medication Sig Dispense Refill  . albuterol (PROVENTIL HFA;VENTOLIN HFA) 108 (90 BASE) MCG/ACT inhaler Inhale 2 puffs into the lungs every 6 (six) hours as needed for wheezing or shortness of breath. 1 Inhaler 11  . aspirin 81 MG tablet Take 81 mg by mouth daily.    . carvedilol (COREG) 3.125 MG tablet Take 3.125 mg by mouth at bedtime.    . cetirizine (ZYRTEC) 10 MG tablet Take 1 tablet (10 mg total) by mouth daily. 30 tablet 11  . finasteride (PROSCAR) 5 MG tablet Take 5 mg by mouth daily.    Marland Kitchen  meclizine (ANTIVERT) 12.5 MG tablet Take 1 tablet (12.5 mg total) by mouth 3 (three) times daily as needed for dizziness. 30 tablet 1  . nitroGLYCERIN (NITROSTAT) 0.4 MG SL tablet Place 1 tablet (0.4 mg total) under the tongue every 5 (five) minutes as needed for chest pain (x 3 doses). Reported on 06/15/2015 25 tablet 6  . nortriptyline (PAMELOR) 25 MG capsule Take 1 capsule (25 mg total) by mouth at bedtime. Yearly physical is due in Sept must see MD for future refills 90 capsule 0  . ondansetron (ZOFRAN) 4 MG tablet Take 1 tablet (4  mg total) by mouth every 8 (eight) hours as needed for nausea or vomiting. 30 tablet 0  . rosuvastatin (CRESTOR) 10 MG tablet TAKE 1 TABLET DAILY 90 tablet 0  . sertraline (ZOLOFT) 50 MG tablet TAKE 1 TABLET DAILY 90 tablet 1  . tamsulosin (FLOMAX) 0.4 MG CAPS capsule TAKE 1 CAPSULE DAILY 90 capsule 1   No current facility-administered medications on file prior to visit.     Review of Systems  Constitutional: Negative for unusual diaphoresis or night sweats HENT: Negative for ear swelling or discharge Eyes: Negative for worsening visual haziness  Respiratory: Negative for choking and stridor.   Gastrointestinal: Negative for distension or worsening eructation Genitourinary: Negative for retention or change in urine volume.  Musculoskeletal: Negative for other MSK pain or swelling Skin: Negative for color change and worsening wound Neurological: Negative for tremors and numbness other than noted  Psychiatric/Behavioral: Negative for decreased concentration or agitation other than above       Objective:   Physical Exam BP 138/78   Pulse 81   Temp 98.3 F (36.8 C) (Oral)   Resp 20   Wt 170 lb (77.1 kg)   SpO2 95%   BMI 24.39 kg/m  VS noted,  Constitutional: Pt appears in no apparent distress HENT: Head: NCAT.  Right Ear: External ear normal.  Left Ear: External ear normal.  Eyes: . Pupils are equal, round, and reactive to light. Conjunctivae and EOM are normal Neck: Normal range of motion. Neck supple.  Cardiovascular: Normal rate and regular rhythm.   Pulmonary/Chest: Effort normal and breath sounds without rales or wheezing.  Abd:  Soft, NT, ND, + BS Spine -nontender throughout, no swelling or rash Neurological: Pt is alert. Not confused , motor 5/5 intact Skin: Skin is warm. No rash, no LE edema Psychiatric: Pt behavior is normal. No agitation. not depressed affect  Dec 02 2012 most recent LS spine film: IMPRESSION: 1.  Grade 1 L4-L5 spondylolisthesis is new /  increased since 2005. Associated chronic L4-L5 facet arthropathy. 2. Progressed and now severe L2-L3 disc and endplate degeneration with mild retrolisthesis since 2005.     Assessment & Plan:

## 2015-12-22 NOTE — Progress Notes (Signed)
Pre visit review using our clinic review tool, if applicable. No additional management support is needed unless otherwise documented below in the visit note. 

## 2015-12-22 NOTE — Assessment & Plan Note (Signed)
stable overall by history and exam, recent data reviewed with pt, and pt to continue medical treatment as before,  to f/u any worsening symptoms or concerns BP Readings from Last 3 Encounters:  12/22/15 138/78  11/09/15 132/72  06/15/15 138/82

## 2015-12-22 NOTE — Patient Instructions (Signed)
Please take all new medication as prescribed - the pain medication, muscle relaxer, and prednisone  You can stop the prednisone if the goes compeletely away  Please continue all other medications as before, and refills have been done if requested.  Please have the pharmacy call with any other refills you may need.  Please keep your appointments with your specialists as you may have planned

## 2015-12-26 ENCOUNTER — Encounter: Payer: Self-pay | Admitting: Internal Medicine

## 2015-12-26 MED ORDER — SCOPOLAMINE 1 MG/3DAYS TD PT72: 1.0000 | MEDICATED_PATCH | TRANSDERMAL | 0 refills | Status: DC

## 2015-12-26 MED ORDER — SCOPOLAMINE 1 MG/3DAYS TD PT72
1.0000 | MEDICATED_PATCH | TRANSDERMAL | 0 refills | Status: DC
Start: 2015-12-26 — End: 2015-12-26

## 2016-01-03 ENCOUNTER — Other Ambulatory Visit: Payer: Self-pay | Admitting: Internal Medicine

## 2016-01-09 ENCOUNTER — Ambulatory Visit (INDEPENDENT_AMBULATORY_CARE_PROVIDER_SITE_OTHER): Payer: Medicare Other | Admitting: Internal Medicine

## 2016-01-09 VITALS — BP 140/82 | HR 76 | Temp 98.3°F | Resp 20 | Wt 175.0 lb

## 2016-01-09 DIAGNOSIS — R7302 Impaired glucose tolerance (oral): Secondary | ICD-10-CM

## 2016-01-09 DIAGNOSIS — I10 Essential (primary) hypertension: Secondary | ICD-10-CM | POA: Diagnosis not present

## 2016-01-09 DIAGNOSIS — M5416 Radiculopathy, lumbar region: Secondary | ICD-10-CM

## 2016-01-09 MED ORDER — GABAPENTIN 100 MG PO CAPS: 100.0000 mg | ORAL_CAPSULE | Freq: Three times a day (TID) | ORAL | 0 refills | Status: DC

## 2016-01-09 MED ORDER — GABAPENTIN 300 MG PO CAPS
300.0000 mg | ORAL_CAPSULE | Freq: Three times a day (TID) | ORAL | 3 refills | Status: DC
Start: 2016-01-09 — End: 2016-01-24

## 2016-01-09 MED ORDER — GABAPENTIN 300 MG PO CAPS: 300.0000 mg | ORAL_CAPSULE | Freq: Three times a day (TID) | ORAL | 3 refills | Status: DC

## 2016-01-09 MED ORDER — HYDROCODONE-ACETAMINOPHEN 5-325 MG PO TABS: 1.0000 | ORAL_TABLET | Freq: Four times a day (QID) | ORAL | 0 refills | Status: DC | PRN

## 2016-01-09 NOTE — Patient Instructions (Signed)
Please take all new medication as prescribed - the lower dose pain medication (hydrocodone), as well as the gabapentin 100 mg three times per day for 1 wk, then 300 mg three times per day after that  You will be contacted regarding the referral for: MRI, and Neurosurgury  Please continue all other medications as before, and refills have been done if requested.  Please have the pharmacy call with any other refills you may need.  Please continue your efforts at being more active, low cholesterol diet, and weight control.  Please keep your appointments with your specialists as you may have planned

## 2016-01-09 NOTE — Progress Notes (Signed)
Pre visit review using our clinic review tool, if applicable. No additional management support is needed unless otherwise documented below in the visit note. 

## 2016-01-09 NOTE — Progress Notes (Signed)
Subjective:    Patient ID: Troy Combs, male    DOB: Sep 20, 1931, 82 y.o.   MRN: [id number]  HPI  Here to f/u, finished prednisone, but unfortunately now pain much more severe, started getting worse on the cruise, spent 2 days of cruise in bed.  Pt with no bowel or bladder change, fever, wt loss, or falls, but right LBP now severe, constant, with radiation to buttocks and distal RLE assoc with some numbness and mild weakness.  Walks funny, no falls.  Pain now 9/10 but previous hydrocodon 10 was too strong, knocked him out.. Past Medical History:  Diagnosis Date  . Allergic rhinitis 09/20/2014  . Anemia, unspecified   . Bladder neck obstruction   . Coronary atherosclerosis of unspecified type of vessel, native or graft   . Dysthymic disorder   . Impaired glucose tolerance 12/02/2012  . Impotence of organic origin   . Malignant melanoma of skin of upper limb, including shoulder ([place])   . Old myocardial infarction   . Personal history of malignant melanoma of skin   . Personal history of other diseases of digestive system   . Thoracic or lumbosacral neuritis or radiculitis, unspecified   . Unspecified essential hypertension    Past Surgical History:  Procedure Laterality Date  . ARTHRODESIS  07/06/2002   of left long finger distal interphalangeal joint with Kirschner wire fixation X 3  . ATRIAL ABLATION SURGERY  05/04/2001   Dr. [name]  . CARDIAC CATHETERIZATION  09/05/2008   Revealing 4 of 4 patent grafts with native multivessel coronary artery disease, EF  of 60% without regional wall motion abnormalities.  . CHOLECYSTECTOMY    . CORONARY ANGIOPLASTY  1993  . CORONARY ARTERY BYPASS GRAFT  10/17/2000   [name]. [name], [place]     AFTER LIVER TRAUMA AND HAND SURGERY  . HERNIA REPAIR     BILATERAL    reports that he quit smoking about 60 years ago. He has never used smokeless tobacco. He reports that he drinks alcohol. He reports that he does not  use drugs. family history includes Heart disease in his father. Allergies  Allergen Reactions  . Iohexol Other (See Comments)     Code: HIVES, Desc: PATENT STATES HE IS ALLERGIC TO IV DYE 09/14/08/RM, Onset Date: [id number]   . Ciprofloxacin Other (See Comments)    Patient unsure just know he has a reaction to med   Current Outpatient Prescriptions on File Prior to Visit  Medication Sig Dispense Refill  . albuterol (PROVENTIL HFA;VENTOLIN HFA) 108 (90 BASE) MCG/ACT inhaler Inhale 2 puffs into the lungs every 6 (six) hours as needed for wheezing or shortness of breath. 1 Inhaler 11  . aspirin 81 MG tablet Take 81 mg by mouth daily.    . carvedilol (COREG) 3.125 MG tablet Take 3.125 mg by mouth at bedtime.    . cetirizine (ZYRTEC) 10 MG tablet Take 1 tablet (10 mg total) by mouth daily. 30 tablet 11  . finasteride (PROSCAR) 5 MG tablet Take 5 mg by mouth daily.    . meclizine (ANTIVERT) 12.5 MG tablet Take 1 tablet (12.5 mg total) by mouth 3 (three) times daily as needed for dizziness. 30 tablet 1  . nitroGLYCERIN (NITROSTAT) 0.4 MG SL tablet Place 1 tablet (0.4 mg total) under the tongue every 5 (five) minutes as needed for chest pain (x 3 doses). Reported on 06/15/2015 25 tablet 6  . nortriptyline (PAMELOR) 25 MG capsule Take 1  capsule (25 mg total) by mouth at bedtime. Yearly physical is due in Sept must see MD for future refills 90 capsule 0  . ondansetron (ZOFRAN) 4 MG tablet Take 1 tablet (4 mg total) by mouth every 8 (eight) hours as needed for nausea or vomiting. 30 tablet 0  . predniSONE (DELTASONE) 10 MG tablet 3 tabs by mouth per day for 3 days,2tabs per day for 3 days,1tab per day for 3 days 18 tablet 0  . rosuvastatin (CRESTOR) 10 MG tablet TAKE 1 TABLET DAILY 90 tablet 0  . scopolamine (TRANSDERM-SCOP, 1.5 MG,) 1 MG/3DAYS Place 1 patch (1.5 mg total) onto the skin every 3 (three) days. 10 patch 0  . sertraline (ZOLOFT) 50 MG tablet TAKE 1 TABLET DAILY 90 tablet 1  . tamsulosin  (FLOMAX) 0.4 MG CAPS capsule TAKE 1 CAPSULE DAILY 90 capsule 1  . tiZANidine (ZANAFLEX) 4 MG tablet Take 1 tablet (4 mg total) by mouth every 6 (six) hours as needed for muscle spasms. 40 tablet 1   No current facility-administered medications on file prior to visit.    Review of Systems  Constitutional: Negative for unusual diaphoresis or night sweats HENT: Negative for ear swelling or discharge Eyes: Negative for worsening visual haziness  Respiratory: Negative for choking and stridor.   Gastrointestinal: Negative for distension or worsening eructation Genitourinary: Negative for retention or change in urine volume.  Musculoskeletal: Negative for other MSK pain or swelling Skin: Negative for color change and worsening wound Neurological: Negative for tremors and numbness other than noted  Psychiatric/Behavioral: Negative for decreased concentration or agitation other than above       Objective:   Physical Exam BP 140/82   Pulse 76   Temp 98.3 F (36.8 C) (Oral)   Resp 20   Wt 175 lb (79.4 kg)   SpO2 97%   BMI 25.11 kg/m  VS noted,  Constitutional: Pt appears in no apparent distress HENT: Head: NCAT.  Right Ear: External ear normal.  Left Ear: External ear normal.  Eyes: . Pupils are equal, round, and reactive to light. Conjunctivae and EOM are normal Neck: Normal range of motion. Neck supple.  Cardiovascular: Normal rate and regular rhythm.   Pulmonary/Chest: Effort normal and breath sounds without rales or wheezing.  Abd:  Soft, NT, ND, + BS Spine: nontender except lowest lumbar midline , right lumbar paravertebral tender Neurological: Pt is alert. Not confused , motor 4+/5 RLE o/w intact Skin: Skin is warm. No rash, no LE edema Psychiatric: Pt behavior is normal. No agitation.     Assessment & Plan:

## 2016-01-12 ENCOUNTER — Telehealth: Payer: Self-pay | Admitting: Internal Medicine

## 2016-01-12 NOTE — Telephone Encounter (Signed)
Pt wife called in and said that pt is still in pain and wants to know if pt can take more of the Hydrocodone?  And how much more?

## 2016-01-12 NOTE — Telephone Encounter (Signed)
Very sorry, but I have to say we need to try to continue with the dose as prescribed, to avoid overdosing.  Thanks

## 2016-01-14 ENCOUNTER — Ambulatory Visit
Admission: RE | Admit: 2016-01-14 | Discharge: 2016-01-14 | Disposition: A | Discharge status: Home / Self Care | Payer: Medicare Other | Source: Ambulatory Visit | Attending: Internal Medicine | Admitting: Internal Medicine

## 2016-01-14 DIAGNOSIS — M5416 Radiculopathy, lumbar region: Secondary | ICD-10-CM

## 2016-01-15 ENCOUNTER — Encounter: Payer: Self-pay | Admitting: Neurology

## 2016-01-15 ENCOUNTER — Ambulatory Visit (INDEPENDENT_AMBULATORY_CARE_PROVIDER_SITE_OTHER): Payer: Medicare Other | Admitting: Neurology

## 2016-01-15 VITALS — BP 138/80 | HR 86 | Temp 97.5°F | Ht 70.0 in | Wt 172.3 lb

## 2016-01-15 DIAGNOSIS — M5416 Radiculopathy, lumbar region: Secondary | ICD-10-CM | POA: Insufficient documentation

## 2016-01-15 DIAGNOSIS — G629 Polyneuropathy, unspecified: Secondary | ICD-10-CM

## 2016-01-15 DIAGNOSIS — M5441 Lumbago with sciatica, right side: Secondary | ICD-10-CM | POA: Diagnosis not present

## 2016-01-15 DIAGNOSIS — H8111 Benign paroxysmal vertigo, right ear: Secondary | ICD-10-CM | POA: Diagnosis not present

## 2016-01-15 NOTE — Assessment & Plan Note (Signed)
stable overall by history and exam, recent data reviewed with pt, and pt to continue medical treatment as before,  to f/u any worsening symptoms or concerns Lab Results  Component Value Date   HGBA1C 5.7 01/25/2015   Pt to call for onset polys or cbg > 200 with tx

## 2016-01-15 NOTE — Progress Notes (Signed)
NEUROLOGY FOLLOW UP OFFICE NOTE  [facility]ONH[facility] MASSARO [id number]  HISTORY OF PRESENT ILLNESS: I had the pleasure of seeing Troy Combs in follow-up in the neurology clinic on 01/15/2016. The patient was last seen 7 months ago for vertigo and headaches. Since his last visit, he had one bout of vertigo which resolved by the time he went for vestibular rehab again. He only get off balanced if he turns quickly, using his walking stick helps a lat. He denies any falls, no headaches. He was started on gabapentin for back pain radiating down his legs, and will be increasing dose to 300mg  TID tomorrow. He denies any diplopia, dysarthria, dysphagia, but has noticed it takes more effort to swallow, no choking. He denies any focal numbness/tingling. He provides additional information today that his brother has MS and post-polio syndrome.   HPI: This is a pleasant 82 yo RH man who presented for vertigo and headaches. He had been previously seen by one of my partners, Dr. [name] in 2014 for cervicogenic headaches. He had vertigo that started in April/May 2016. He would have a sensation of tilting when he sat on the edge of the bed, with violent vomiting. He was prescribed meclizine, which would help sometimes. There was concern this was due to his beta-blocker, and after adjustment, he reported the intense dizziness went away, but he still had dizziness if moving too quickly, turning his head, or standing up. He was in the ER in September 2016 due to significant vertigo with dull headache. He had an MRI brain without contrast which I personally reviewed, no acute changes, there was chronic microvascular disease and generalized atrophy. He saw his PCP and was referred for vestibular rehab. He reports that vestibular rehab worked almost immediately. The dizziness has since resolved. He has a little lightheadedness and feels off balance and unsteady, no falls. He has occasional tingling and numbness in the last 3 digits  of both feet, and occasionally pins and needles sensation in the soles of his feet. He has had monocular vertical diplopia for many years, and noticed that this was less when he takes nortriptyline. He had been having occipital headaches, but reported those are pretty much gone, with 1 to 2 over 10 pain, no associated nausea, vomiting, photo/phonophobia. He has some neck crepitus, "like gravel when turning my head," and occasional low back pain. He has some constipation. No family history of similar symptoms.  PAST MEDICAL HISTORY: Past Medical History:  Diagnosis Date  . Allergic rhinitis 09/20/2014  . Anemia, unspecified   . Bladder neck obstruction   . Coronary atherosclerosis of unspecified type of vessel, native or graft   . Dysthymic disorder   . Impaired glucose tolerance 12/02/2012  . Impotence of organic origin   . Malignant melanoma of skin of upper limb, including shoulder ([place])   . Old myocardial infarction   . Personal history of malignant melanoma of skin   . Personal history of other diseases of digestive system   . Thoracic or lumbosacral neuritis or radiculitis, unspecified   . Unspecified essential hypertension     MEDICATIONS: Current Outpatient Prescriptions on File Prior to Visit  Medication Sig Dispense Refill  . albuterol (PROVENTIL HFA;VENTOLIN HFA) 108 (90 BASE) MCG/ACT inhaler Inhale 2 puffs into the lungs every 6 (six) hours as needed for wheezing or shortness of breath. 1 Inhaler 11  . aspirin 81 MG tablet Take 81 mg by mouth daily.    . carvedilol (COREG) 3.125 MG tablet Take  3.125 mg by mouth at bedtime.    . cetirizine (ZYRTEC) 10 MG tablet Take 1 tablet (10 mg total) by mouth daily. 30 tablet 11  . finasteride (PROSCAR) 5 MG tablet Take 5 mg by mouth daily.    Marland Kitchen gabapentin (NEURONTIN) 100 MG capsule Take 1 capsule (100 mg total) by mouth 3 (three) times daily. 21 capsule 0  . gabapentin (NEURONTIN) 300 MG capsule Take 1 capsule (300 mg total) by mouth 3  (three) times daily. To start after the 100 tid first week 90 capsule 3  . HYDROcodone-acetaminophen (NORCO/VICODIN) 5-325 MG tablet Take 1 tablet by mouth every 6 (six) hours as needed for moderate pain. 60 tablet 0  . meclizine (ANTIVERT) 12.5 MG tablet Take 1 tablet (12.5 mg total) by mouth 3 (three) times daily as needed for dizziness. 30 tablet 1  . nitroGLYCERIN (NITROSTAT) 0.4 MG SL tablet Place 1 tablet (0.4 mg total) under the tongue every 5 (five) minutes as needed for chest pain (x 3 doses). Reported on 06/15/2015 25 tablet 6  . nortriptyline (PAMELOR) 25 MG capsule Take 1 capsule (25 mg total) by mouth at bedtime. Yearly physical is due in Sept must see MD for future refills 90 capsule 0  . ondansetron (ZOFRAN) 4 MG tablet Take 1 tablet (4 mg total) by mouth every 8 (eight) hours as needed for nausea or vomiting. 30 tablet 0  . predniSONE (DELTASONE) 10 MG tablet 3 tabs by mouth per day for 3 days,2tabs per day for 3 days,1tab per day for 3 days 18 tablet 0  . rosuvastatin (CRESTOR) 10 MG tablet TAKE 1 TABLET DAILY 90 tablet 0  . scopolamine (TRANSDERM-SCOP, 1.5 MG,) 1 MG/3DAYS Place 1 patch (1.5 mg total) onto the skin every 3 (three) days. 10 patch 0  . sertraline (ZOLOFT) 50 MG tablet TAKE 1 TABLET DAILY 90 tablet 1  . tamsulosin (FLOMAX) 0.4 MG CAPS capsule TAKE 1 CAPSULE DAILY 90 capsule 1  . tiZANidine (ZANAFLEX) 4 MG tablet Take 1 tablet (4 mg total) by mouth every 6 (six) hours as needed for muscle spasms. 40 tablet 1   No current facility-administered medications on file prior to visit.     ALLERGIES: Allergies  Allergen Reactions  . Iohexol Other (See Comments)     Code: HIVES, Desc: PATENT STATES HE IS ALLERGIC TO IV DYE 09/14/08/RM, Onset Date: [id number]   . Ciprofloxacin Other (See Comments)    Patient unsure just know he has a reaction to med    FAMILY HISTORY: Family History  Problem Relation Age of Onset  . Heart disease Father   . Ataxia Neg Hx   . Chorea Neg  Hx   . Dementia Neg Hx   . Mental retardation Neg Hx   . Migraines Neg Hx   . Multiple sclerosis Neg Hx   . Neurofibromatosis Neg Hx   . Neuropathy Neg Hx   . Parkinsonism Neg Hx   . Seizures Neg Hx   . Stroke Neg Hx     SOCIAL HISTORY: Social History   Social History  . Marital status: Married    Spouse name: N/A  . Number of children: 5  . Years of education: N/A   Occupational History  . Not on file.   Social History Main Topics  . Smoking status: Former Smoker    Quit date: 09/21/1955  . Smokeless tobacco: Never Used  . Alcohol use 0.0 oz/week     Comment: one or two ounces a week  .  Drug use: No  . Sexual activity: Not on file   Other Topics Concern  . Not on file   Social History Narrative   Lives with wife in a one story home.  Has 5 children.  Retired Freight forwarder for [employer].  Education: 2 years of college.     REVIEW OF SYSTEMS: Constitutional: No fevers, chills, or sweats, no generalized fatigue, change in appetite Eyes: No visual changes, double vision, eye pain Ear, nose and throat: No hearing loss, ear pain, nasal congestion, sore throat Cardiovascular: No chest pain, palpitations Respiratory:  No shortness of breath at rest or with exertion, wheezes GastrointestinaI: No nausea, vomiting, diarrhea, abdominal pain, fecal incontinence Genitourinary:  No dysuria, urinary retention or frequency Musculoskeletal:  No neck pain, +back pain Integumentary: No rash, pruritus, skin lesions Neurological: as above Psychiatric: No depression, insomnia, anxiety Endocrine: No palpitations, fatigue, diaphoresis, mood swings, change in appetite, change in weight, increased thirst Hematologic/Lymphatic:  No anemia, purpura, petechiae. Allergic/Immunologic: no itchy/runny eyes, nasal congestion, recent allergic reactions, rashes  PHYSICAL EXAM: Vitals:   01/15/16 1326  BP: 138/80  Pulse: 86  Temp: 97.5 F (36.4 C)   General: No acute distress Head:   Normocephalic/atraumatic Neck: supple, no paraspinal tenderness, full range of motion Heart:  Regular rate and rhythm Lungs:  Clear to auscultation bilaterally Back: No paraspinal tenderness Skin/Extremities: No rash, no edema Neurological Exam: alert and oriented to person, place, and time. No aphasia or dysarthria. Fund of knowledge is appropriate.  Recent and remote memory are intact.  Attention and concentration are normal.    Able to name objects and repeat phrases. Cranial nerves: Pupils equal, round, reactive to light. Extraocular movements intact with no nystagmus. Visual fields full. Facial sensation intact. No facial asymmetry. Tongue, uvula, palate midline.  Motor: Bulk and tone normal, muscle strength 5/5 throughout with no pronator drift.  Sensation intact to pin on both UE, decreased on right calf (chronic since heart surgery), decreased vibration to both ankles. Deep tendon reflexes +1 on both UE and patella, unable to elicit in both ankles. Toes downgoing.  Finger to nose testing intact.  Gait slow and cautious, no ataxia, mild difficulty with tandem walk but able. Romberg positive sway.  IMPRESSION: This is a pleasant 82 yo RH man with a history of hypertension, CAD s/p MI and CABG, orthostatic hypotension, cervicogenic headaches, who presented for vertigo and unsteadiness. He has infrequent positional vertigo, and has home vestibular exercises. If symptoms recur and do not respond to home PT, another referral to vestibular rehab will be sent. He continues to have occasional sensation of lightheadedness/unsteadiness, likely  multifactorial from history of orthostatic hypotension, as well as mild sensory ataxia due to peripheral neuropathy. Continue to monitor. He denies any headaches. He reports back pain, and may benefit from PT, he will discuss this with his PCP. He will follow-up in 1 year and knows to call for any changes.   Thank you for allowing me to participate in his care.   Please do not hesitate to call for any questions or concerns.    Ellouise Newer, M.D.   CC: Dr. Jenny Reichmann

## 2016-01-15 NOTE — Assessment & Plan Note (Signed)
stable overall by history and exam, recent data reviewed with pt, and pt to continue medical treatment as before,  to f/u any worsening symptoms or concerns BP Readings from Last 3 Encounters:  01/15/16 138/80  01/09/16 140/82  12/22/15 138/78

## 2016-01-15 NOTE — Assessment & Plan Note (Signed)
Severe pain, for reduced hydrocodone strength as 10/325 's were too sedating, also gabapentin trial, MRI and NS referrral,  to f/u any worsening symptoms or concerns

## 2016-01-15 NOTE — Patient Instructions (Signed)
1. Continue all your medications 2. If vertigo occurs, try home exercises. If not helpful, we will set up another visit with Vestibular rehab 3. Follow-up with Dr. Jenny Reichmann regarding further management of back pain, consider physical therapy 4. Follow-up in 1 year, call for any changes

## 2016-01-19 ENCOUNTER — Encounter: Payer: Self-pay | Admitting: Neurology

## 2016-01-24 ENCOUNTER — Other Ambulatory Visit (INDEPENDENT_AMBULATORY_CARE_PROVIDER_SITE_OTHER): Payer: Medicare Other

## 2016-01-24 ENCOUNTER — Other Ambulatory Visit: Payer: Self-pay | Admitting: Internal Medicine

## 2016-01-24 ENCOUNTER — Ambulatory Visit (INDEPENDENT_AMBULATORY_CARE_PROVIDER_SITE_OTHER): Payer: Medicare Other | Admitting: Internal Medicine

## 2016-01-24 VITALS — BP 136/70 | HR 83 | Temp 98.1°F | Resp 20 | Wt 171.2 lb

## 2016-01-24 DIAGNOSIS — R6889 Other general symptoms and signs: Secondary | ICD-10-CM | POA: Diagnosis not present

## 2016-01-24 DIAGNOSIS — Z0001 Encounter for general adult medical examination with abnormal findings: Secondary | ICD-10-CM | POA: Diagnosis not present

## 2016-01-24 DIAGNOSIS — H60391 Other infective otitis externa, right ear: Secondary | ICD-10-CM | POA: Diagnosis not present

## 2016-01-24 DIAGNOSIS — H8111 Benign paroxysmal vertigo, right ear: Secondary | ICD-10-CM

## 2016-01-24 DIAGNOSIS — M4806 Spinal stenosis, lumbar region: Secondary | ICD-10-CM

## 2016-01-24 DIAGNOSIS — R7303 Prediabetes: Secondary | ICD-10-CM

## 2016-01-24 DIAGNOSIS — M5431 Sciatica, right side: Secondary | ICD-10-CM | POA: Diagnosis not present

## 2016-01-24 DIAGNOSIS — M48061 Spinal stenosis, lumbar region without neurogenic claudication: Secondary | ICD-10-CM | POA: Insufficient documentation

## 2016-01-24 DIAGNOSIS — Z23 Encounter for immunization: Secondary | ICD-10-CM | POA: Diagnosis not present

## 2016-01-24 DIAGNOSIS — R269 Unspecified abnormalities of gait and mobility: Secondary | ICD-10-CM | POA: Diagnosis not present

## 2016-01-24 DIAGNOSIS — H60399 Other infective otitis externa, unspecified ear: Secondary | ICD-10-CM | POA: Insufficient documentation

## 2016-01-24 LAB — BASIC METABOLIC PANEL
BUN: 17 mg/dL (ref 6–23)
CO2: 31 mEq/L (ref 19–32)
Calcium: 9.5 mg/dL (ref 8.4–10.5)
Chloride: 104 mEq/L (ref 96–112)
Creatinine, Ser: 0.96 mg/dL (ref 0.40–1.50)
GFR: 79.67 mL/min (ref >60.00)
Glucose, Bld: 112 mg/dL — ABNORMAL HIGH (ref 70–99)
Potassium: 5.4 mEq/L — ABNORMAL HIGH (ref 3.5–5.1)
Sodium: 142 mEq/L (ref 135–145)

## 2016-01-24 LAB — URINALYSIS, ROUTINE W REFLEX MICROSCOPIC
Bilirubin Urine: NEGATIVE
Hgb urine dipstick: NEGATIVE
Ketones, ur: NEGATIVE
Leukocytes, UA: NEGATIVE
Nitrite: NEGATIVE
Specific Gravity, Urine: 1.01 (ref 1.000–1.030)
Total Protein, Urine: NEGATIVE
Urine Glucose: NEGATIVE
Urobilinogen, UA: 2 — AB (ref 0.0–1.0)
pH: 7 (ref 5.0–8.0)

## 2016-01-24 LAB — CBC WITH DIFFERENTIAL/PLATELET
Basophils Absolute: 0.1 10*3/uL (ref 0.0–0.1)
Basophils Relative: 0.5 % (ref 0.0–3.0)
Eosinophils Absolute: 0.5 10*3/uL (ref 0.0–0.7)
Eosinophils Relative: 4.7 % (ref 0.0–5.0)
HCT: 46 % (ref 39.0–52.0)
Hemoglobin: 15.8 g/dL (ref 13.0–17.0)
Lymphocytes Relative: 16 % (ref 12.0–46.0)
Lymphs Abs: 1.6 10*3/uL (ref 0.7–4.0)
MCHC: 34.4 g/dL (ref 30.0–36.0)
MCV: 87 fl (ref 78.0–100.0)
Monocytes Absolute: 0.9 10*3/uL (ref 0.1–1.0)
Monocytes Relative: 9.5 % (ref 3.0–12.0)
Neutro Abs: 6.8 10*3/uL (ref 1.4–7.7)
Neutrophils Relative %: 69.3 % (ref 43.0–77.0)
Platelets: 227 10*3/uL (ref 150.0–400.0)
RBC: 5.29 Mil/uL (ref 4.22–5.81)
RDW: 14.1 % (ref 11.5–15.5)
WBC: 9.8 10*3/uL (ref 4.0–10.5)

## 2016-01-24 LAB — LIPID PANEL
Cholesterol: 110 mg/dL (ref 0–200)
HDL: 49.7 mg/dL (ref >39.00)
LDL Cholesterol: 34 mg/dL (ref 0–99)
NonHDL: 60.39
Total CHOL/HDL Ratio: 2
Triglycerides: 133 mg/dL (ref 0.0–149.0)
VLDL: 26.6 mg/dL (ref 0.0–40.0)

## 2016-01-24 LAB — HEPATIC FUNCTION PANEL
ALT: 17 U/L (ref 0–53)
AST: 17 U/L (ref 0–37)
Albumin: 4.4 g/dL (ref 3.5–5.2)
Alkaline Phosphatase: 50 U/L (ref 39–117)
Bilirubin, Direct: 0.2 mg/dL (ref 0.0–0.3)
Total Bilirubin: 1 mg/dL (ref 0.2–1.2)
Total Protein: 7 g/dL (ref 6.0–8.3)

## 2016-01-24 LAB — TSH: TSH: 3.15 u[IU]/mL (ref 0.35–4.50)

## 2016-01-24 LAB — HEMOGLOBIN A1C: Hgb A1c MFr Bld: 6 % (ref 4.6–6.5)

## 2016-01-24 MED ORDER — GABAPENTIN 300 MG PO CAPS: ORAL_CAPSULE | ORAL | 5 refills | Status: DC

## 2016-01-24 MED ORDER — NEOMYCIN-POLYMYXIN-HC 1 % OT SOLN: 3.0000 [drp] | Freq: Four times a day (QID) | OTIC | 0 refills | Status: DC

## 2016-01-24 NOTE — Progress Notes (Signed)
Subjective:    Patient ID: Troy Combs, male    DOB: 1932-04-19, 82 y.o.   MRN: [id number]  HPI  Here for wellness and f/u;  Overall doing ok;  Pt denies Chest pain, worsening SOB, DOE, wheezing, orthopnea, PND, worsening LE edema, palpitations, dizziness or syncope.  Pt denies neurological change such as new headache, facial or extremity weakness.  Pt denies polydipsia, polyuria, or low sugar symptoms. Pt states overall good compliance with treatment and medications, good tolerability, and has been trying to follow appropriate diet.  Pt denies worsening depressive symptoms, suicidal ideation or panic. No fever, night sweats, wt loss, loss of appetite, or other constitutional symptoms.  Pt states good ability with ADL's, has low fall risk, home safety reviewed and adequate, no other significant changes in hearing or vision, and usually occasionally active with exercise   Wt Readings from Last 3 Encounters:  01/24/16 171 lb 4 oz (77.7 kg)  01/15/16 172 lb 5 oz (78.2 kg)  01/09/16 175 lb (79.4 kg)  Recent LBP improved but still mod persistent exertional, but having less right sciatica with current tx. Pt continues to have recurring right LBP, no bowel or bladder change, fever, wt loss,  worsening LE pain/numbness/weakness, gait change or falls.   Still having significant dizziness, though improving with home excercises and vestibular rehab.   Pt denies polydipsia, polyuria  Pt states overall good compliance with meds, trying to follow lower cholesterol diet.  Has ongoing urinary leakage and BPH , has not seen urology in over 10 yrs, but declines f/u for now. Incidentally as well with new onset right ear pain, swelling, discomfort with slight drainage, without HA, fever, Sinus pain, ST, cough. Past Medical History:  Diagnosis Date  . Allergic rhinitis 09/20/2014  . Anemia, unspecified   . Bladder neck obstruction   . Coronary atherosclerosis of unspecified type of vessel, native or graft   .  Dysthymic disorder   . Impaired glucose tolerance 12/02/2012  . Impotence of organic origin   . Malignant melanoma of skin of upper limb, including shoulder ([place])   . Old myocardial infarction   . Personal history of malignant melanoma of skin   . Personal history of other diseases of digestive system   . Thoracic or lumbosacral neuritis or radiculitis, unspecified   . Unspecified essential hypertension    Past Surgical History:  Procedure Laterality Date  . ARTHRODESIS  07/06/2002   of left long finger distal interphalangeal joint with Kirschner wire fixation X 3  . ATRIAL ABLATION SURGERY  05/04/2001   Dr. [name]  . CARDIAC CATHETERIZATION  09/05/2008   Revealing 4 of 4 patent grafts with native multivessel coronary artery disease, EF  of 60% without regional wall motion abnormalities.  . CHOLECYSTECTOMY    . CORONARY ANGIOPLASTY  1993  . CORONARY ARTERY BYPASS GRAFT  10/17/2000   [name]. [name], [place]     AFTER LIVER TRAUMA AND HAND SURGERY  . HERNIA REPAIR     BILATERAL    reports that he quit smoking about 60 years ago. He has never used smokeless tobacco. He reports that he drinks alcohol. He reports that he does not use drugs. family history includes Heart disease in his father. Allergies  Allergen Reactions  . Iohexol Other (See Comments)     Code: HIVES, Desc: PATENT STATES HE IS ALLERGIC TO IV DYE 09/14/08/RM, Onset Date: [id number]   . Ciprofloxacin Other (See Comments)    Patient  unsure just know he has a reaction to med   Current Outpatient Prescriptions on File Prior to Visit  Medication Sig Dispense Refill  . albuterol (PROVENTIL HFA;VENTOLIN HFA) 108 (90 BASE) MCG/ACT inhaler Inhale 2 puffs into the lungs every 6 (six) hours as needed for wheezing or shortness of breath. 1 Inhaler 11  . aspirin 81 MG tablet Take 81 mg by mouth daily.    . carvedilol (COREG) 3.125 MG tablet Take 3.125 mg by mouth at bedtime.    . cetirizine (ZYRTEC)  10 MG tablet Take 1 tablet (10 mg total) by mouth daily. 30 tablet 11  . finasteride (PROSCAR) 5 MG tablet Take 5 mg by mouth daily.    Marland Kitchen HYDROcodone-acetaminophen (NORCO/VICODIN) 5-325 MG tablet Take 1 tablet by mouth every 6 (six) hours as needed for moderate pain. 60 tablet 0  . meclizine (ANTIVERT) 12.5 MG tablet Take 1 tablet (12.5 mg total) by mouth 3 (three) times daily as needed for dizziness. 30 tablet 1  . nitroGLYCERIN (NITROSTAT) 0.4 MG SL tablet Place 1 tablet (0.4 mg total) under the tongue every 5 (five) minutes as needed for chest pain (x 3 doses). Reported on 06/15/2015 25 tablet 6  . nortriptyline (PAMELOR) 25 MG capsule Take 1 capsule (25 mg total) by mouth at bedtime. Yearly physical is due in Sept must see MD for future refills 90 capsule 0  . ondansetron (ZOFRAN) 4 MG tablet Take 1 tablet (4 mg total) by mouth every 8 (eight) hours as needed for nausea or vomiting. 30 tablet 0  . predniSONE (DELTASONE) 10 MG tablet 3 tabs by mouth per day for 3 days,2tabs per day for 3 days,1tab per day for 3 days 18 tablet 0  . rosuvastatin (CRESTOR) 10 MG tablet TAKE 1 TABLET DAILY 90 tablet 0  . scopolamine (TRANSDERM-SCOP, 1.5 MG,) 1 MG/3DAYS Place 1 patch (1.5 mg total) onto the skin every 3 (three) days. 10 patch 0  . tamsulosin (FLOMAX) 0.4 MG CAPS capsule TAKE 1 CAPSULE DAILY 90 capsule 1  . tiZANidine (ZANAFLEX) 4 MG tablet Take 1 tablet (4 mg total) by mouth every 6 (six) hours as needed for muscle spasms. 40 tablet 1   No current facility-administered medications on file prior to visit.    Review of Systems  Here for wellness and f/u;  Overall doing ok;  Pt denies Chest pain, worsening SOB, DOE, wheezing, orthopnea, PND, worsening LE edema, palpitations, dizziness or syncope.  Pt denies neurological change such as new headache, facial or extremity weakness.  Pt denies polydipsia, polyuria, or low sugar symptoms. Pt states overall good compliance with treatment and medications, good  tolerability, and has been trying to follow appropriate diet.  Pt denies worsening depressive symptoms, suicidal ideation or panic. No fever, night sweats, wt loss, loss of appetite, or other constitutional symptoms.  Pt states good ability with ADL's, has low fall risk, home safety reviewed and adequate, no other significant changes in hearing or vision, and only occasionally active with exercise.     Objective:   Physical Exam BP 136/70   Pulse 83   Temp 98.1 F (36.7 C) (Oral)   Resp 20   Wt 171 lb 4 oz (77.7 kg)   SpO2 91%   BMI 24.57 kg/m   VS noted,  Constitutional: Pt is oriented to person, place, and time. Appears well-developed and well-nourished, in no significant distress Head: Normocephalic and atraumatic  Eyes: Conjunctivae and EOM are normal. Pupils are equal, round, and reactive to  light Right Ear: External ear normal. Right ext canal with 1+ red/swelling/tender/slight d/c without bleeding; TM slight erythejma Left Ear: External ear normal Nose: Nose normal.  Mouth/Throat: Oropharynx is clear and moist  Neck: Normal range of motion. Neck supple. No JVD present. No tracheal deviation present or significant neck LA or mass Cardiovascular: Normal rate, regular rhythm, normal heart sounds and intact distal pulses.   Pulmonary/Chest: Effort normal and breath sounds without rales or wheezing  Abdominal: Soft. Bowel sounds are normal. NT. No HSM  Musculoskeletal: Normal range of motion. Exhibits no edema Lymphadenopathy: Has no cervical adenopathy.  Neurological: Pt is alert and oriented to person, place, and time. Pt has normal reflexes. No cranial nerve deficit. Motor grossly intact Spine: nontender in midline Skin: Skin is warm and dry. No rash noted or new ulcers Psychiatric:  Has normal mood and affect. Behavior is normal.     Assessment & Plan:

## 2016-01-24 NOTE — Progress Notes (Signed)
Pre visit review using our clinic review tool, if applicable. No additional management support is needed unless otherwise documented below in the visit note. 

## 2016-01-24 NOTE — Patient Instructions (Addendum)
Please take all new medication as prescribed - the ear antibiotic  Ok to increase the gabapentin to 2 tabs three times per day if can tolerate  Please continue all other medications as before, and refills have been done if requested.  Please have the pharmacy call with any other refills you may need.  Please continue your efforts at being more active, low cholesterol diet, and weight control.  You are otherwise up to date with prevention measures today.  Please keep your appointments with your specialists as you may have planned  You will be contacted regarding the referral for: Physical Therapy, and Neurosurgury  Please go to the LAB in the Basement (turn left off the elevator) for the tests to be done today  You will be contacted by phone if any changes need to be made immediately.  Otherwise, you will receive a letter about your results with an explanation, but please check with MyChart first.  Please remember to sign up for MyChart if you have not done so, as this will be important to you in the future with finding out test results, communicating by private email, and scheduling acute appointments online when needed.  Please return in 6 months, or sooner if needed

## 2016-01-27 NOTE — Assessment & Plan Note (Signed)
Persistent pain, for NS referral, cont gabapentin, for PT

## 2016-01-27 NOTE — Assessment & Plan Note (Signed)
To cont gabapentin 300 tid, for PT eval

## 2016-01-27 NOTE — Assessment & Plan Note (Signed)
stable overall by history and exam, recent data reviewed with pt, and pt to continue medical treatment as before,  to f/u any worsening symptoms or concerns Lab Results  Component Value Date   HGBA1C 6.0 01/24/2016    

## 2016-01-27 NOTE — Assessment & Plan Note (Signed)

## 2016-01-27 NOTE — Assessment & Plan Note (Addendum)
Mild to mod, for antibx course,  to f/u any worsening symptoms or concerns  In addition to the time spent performing CPE, I spent an additional 25 minutes face to face,in which greater than 50% of this time was spent in counseling and coordination of care for patient's acute illness as documented.  

## 2016-01-27 NOTE — Assessment & Plan Note (Signed)
Improving, cont home excercises, vestibular rehab

## 2016-02-01 ENCOUNTER — Encounter (HOSPITAL_COMMUNITY): Payer: Self-pay | Admitting: *Deleted

## 2016-02-01 ENCOUNTER — Other Ambulatory Visit: Payer: Self-pay | Admitting: Neurosurgery

## 2016-02-01 NOTE — Progress Notes (Signed)
Both pt and pt spouse completed SDW-pre-op call with spousal consent . Spouse denies pt C/O SOB and chest pain. Pt is currently under the care of Dr. Delice Lesch, Cardiology. Pt denies having a chest x ray within the last year. Spouse stated that pt was advised by [facility], Surgical Coordinator, to stop taking Aspirin; pt last dose of Aspirin was Tuesday. Spouse advised to have pt stop taking vitamins, fish oil and herbal medications. Do not take any NSAIDs ie: Ibuprofen, Advil, Naproxen, BC and Goody Powder or any medication containing Aspirin. Spouse verbalized understanding of all pre-op instructions.

## 2016-02-02 ENCOUNTER — Ambulatory Visit (HOSPITAL_COMMUNITY): Payer: Medicare Other

## 2016-02-02 ENCOUNTER — Encounter (HOSPITAL_COMMUNITY)
Admission: RE | Disposition: A | Discharge status: Home / Self Care | Payer: Self-pay | Source: Ambulatory Visit | Attending: Neurosurgery

## 2016-02-02 ENCOUNTER — Ambulatory Visit (HOSPITAL_COMMUNITY): Payer: Medicare Other | Admitting: Emergency Medicine

## 2016-02-02 ENCOUNTER — Observation Stay (HOSPITAL_COMMUNITY)
Admission: RE | Admit: 2016-02-02 | Discharge: 2016-02-03 | Disposition: A | Discharge status: Home / Self Care | Payer: Medicare Other | Source: Ambulatory Visit | Attending: Neurosurgery | Admitting: Neurosurgery

## 2016-02-02 ENCOUNTER — Encounter (HOSPITAL_COMMUNITY): Payer: Self-pay | Admitting: *Deleted

## 2016-02-02 DIAGNOSIS — Z87891 Personal history of nicotine dependence: Secondary | ICD-10-CM | POA: Insufficient documentation

## 2016-02-02 DIAGNOSIS — I10 Essential (primary) hypertension: Secondary | ICD-10-CM | POA: Diagnosis not present

## 2016-02-02 DIAGNOSIS — Z951 Presence of aortocoronary bypass graft: Secondary | ICD-10-CM | POA: Diagnosis not present

## 2016-02-02 DIAGNOSIS — I2510 Atherosclerotic heart disease of native coronary artery without angina pectoris: Secondary | ICD-10-CM | POA: Insufficient documentation

## 2016-02-02 DIAGNOSIS — M5116 Intervertebral disc disorders with radiculopathy, lumbar region: Secondary | ICD-10-CM | POA: Diagnosis present

## 2016-02-02 DIAGNOSIS — I252 Old myocardial infarction: Secondary | ICD-10-CM | POA: Insufficient documentation

## 2016-02-02 DIAGNOSIS — Z419 Encounter for procedure for purposes other than remedying health state, unspecified: Secondary | ICD-10-CM

## 2016-02-02 DIAGNOSIS — Z7982 Long term (current) use of aspirin: Secondary | ICD-10-CM | POA: Insufficient documentation

## 2016-02-02 DIAGNOSIS — M5126 Other intervertebral disc displacement, lumbar region: Secondary | ICD-10-CM | POA: Diagnosis present

## 2016-02-02 HISTORY — DX: Radiculopathy, lumbar region: M54.16

## 2016-02-02 HISTORY — DX: Unspecified osteoarthritis, unspecified site: M19.90

## 2016-02-02 HISTORY — PX: LUMBAR LAMINECTOMY/DECOMPRESSION MICRODISCECTOMY: SHX5026

## 2016-02-02 LAB — SURGICAL PCR SCREEN
MRSA, PCR: NEGATIVE
Staphylococcus aureus: NEGATIVE

## 2016-02-02 SURGERY — LUMBAR LAMINECTOMY/DECOMPRESSION MICRODISCECTOMY 1 LEVEL
Anesthesia: General

## 2016-02-02 MED ORDER — FENTANYL CITRATE (PF) 100 MCG/2ML IJ SOLN
25.0000 ug | INTRAMUSCULAR | Status: DC | PRN
  Administered 2016-02-02: 50 ug via INTRAVENOUS

## 2016-02-02 MED ORDER — ARTIFICIAL TEARS OP OINT
TOPICAL_OINTMENT | OPHTHALMIC | Status: DC | PRN
  Administered 2016-02-02: 1 via OPHTHALMIC

## 2016-02-02 MED ORDER — TAMSULOSIN HCL 0.4 MG PO CAPS
0.4000 mg | ORAL_CAPSULE | Freq: Every day | ORAL | Status: DC
  Administered 2016-02-02 – 2016-02-03 (×2): 0.4 mg via ORAL
  Filled 2016-02-02 (×2): qty 1

## 2016-02-02 MED ORDER — LORATADINE 10 MG PO TABS
10.0000 mg | ORAL_TABLET | Freq: Every day | ORAL | Status: DC
  Administered 2016-02-03: 10 mg via ORAL
  Filled 2016-02-02: qty 1

## 2016-02-02 MED ORDER — LACTATED RINGERS IV SOLN
INTRAVENOUS | Status: DC | PRN
  Administered 2016-02-02 (×3): via INTRAVENOUS

## 2016-02-02 MED ORDER — KETOROLAC TROMETHAMINE 30 MG/ML IJ SOLN
15.0000 mg | Freq: Four times a day (QID) | INTRAMUSCULAR | Status: DC
  Administered 2016-02-02 – 2016-02-03 (×2): 15 mg via INTRAVENOUS
  Filled 2016-02-02 (×2): qty 1

## 2016-02-02 MED ORDER — SODIUM CHLORIDE 0.9% FLUSH: 3.0000 mL | INTRAVENOUS | Status: DC | PRN

## 2016-02-02 MED ORDER — BUPIVACAINE-EPINEPHRINE (PF) 0.5% -1:200000 IJ SOLN
INTRAMUSCULAR | Status: AC
  Filled 2016-02-02: qty 30

## 2016-02-02 MED ORDER — METOCLOPRAMIDE HCL 5 MG/ML IJ SOLN: 10.0000 mg | Freq: Once | INTRAMUSCULAR | Status: DC | PRN

## 2016-02-02 MED ORDER — PHENOL 1.4 % MT LIQD
1.0000 | OROMUCOSAL | Status: DC | PRN
Start: 2016-02-02 — End: 2016-02-03

## 2016-02-02 MED ORDER — CARVEDILOL 3.125 MG PO TABS
3.1250 mg | ORAL_TABLET | Freq: Every day | ORAL | Status: DC
  Administered 2016-02-02: 3.125 mg via ORAL
  Filled 2016-02-02: qty 1

## 2016-02-02 MED ORDER — FINASTERIDE 5 MG PO TABS
5.0000 mg | ORAL_TABLET | Freq: Every evening | ORAL | Status: DC
  Administered 2016-02-02: 5 mg via ORAL
  Filled 2016-02-02: qty 1

## 2016-02-02 MED ORDER — FENTANYL CITRATE (PF) 100 MCG/2ML IJ SOLN
INTRAMUSCULAR | Status: DC | PRN
  Administered 2016-02-02: 25 ug via INTRAVENOUS
  Administered 2016-02-02: 75 ug via INTRAVENOUS

## 2016-02-02 MED ORDER — ROSUVASTATIN CALCIUM 10 MG PO TABS
10.0000 mg | ORAL_TABLET | Freq: Every evening | ORAL | Status: DC
  Administered 2016-02-02: 10 mg via ORAL
  Filled 2016-02-02 (×2): qty 1

## 2016-02-02 MED ORDER — ONDANSETRON HCL 4 MG/2ML IJ SOLN
INTRAMUSCULAR | Status: AC
  Filled 2016-02-02: qty 2

## 2016-02-02 MED ORDER — OXYCODONE-ACETAMINOPHEN 5-325 MG PO TABS: 1.0000 | ORAL_TABLET | ORAL | Status: DC | PRN

## 2016-02-02 MED ORDER — ALBUTEROL SULFATE (2.5 MG/3ML) 0.083% IN NEBU: 3.0000 mL | INHALATION_SOLUTION | Freq: Four times a day (QID) | RESPIRATORY_TRACT | Status: DC | PRN

## 2016-02-02 MED ORDER — FENTANYL CITRATE (PF) 100 MCG/2ML IJ SOLN
INTRAMUSCULAR | Status: AC
Start: 2016-02-02 — End: 2016-02-02
  Filled 2016-02-02: qty 4

## 2016-02-02 MED ORDER — ASPIRIN EC 81 MG PO TBEC
81.0000 mg | DELAYED_RELEASE_TABLET | Freq: Every evening | ORAL | Status: DC
  Administered 2016-02-02: 81 mg via ORAL
  Filled 2016-02-02: qty 1

## 2016-02-02 MED ORDER — 0.9 % SODIUM CHLORIDE (POUR BTL) OPTIME
TOPICAL | Status: DC | PRN
  Administered 2016-02-02: 1000 mL

## 2016-02-02 MED ORDER — NEOMYCIN-POLYMYXIN-HC 1 % OT SOLN
3.0000 [drp] | Freq: Four times a day (QID) | OTIC | Status: DC
  Filled 2016-02-02: qty 10

## 2016-02-02 MED ORDER — ZOLPIDEM TARTRATE 5 MG PO TABS: 5.0000 mg | ORAL_TABLET | Freq: Every evening | ORAL | Status: DC | PRN

## 2016-02-02 MED ORDER — SCOPOLAMINE 1 MG/3DAYS TD PT72
MEDICATED_PATCH | TRANSDERMAL | Status: AC
  Filled 2016-02-02: qty 1

## 2016-02-02 MED ORDER — MECLIZINE HCL 12.5 MG PO TABS: 12.5000 mg | ORAL_TABLET | Freq: Three times a day (TID) | ORAL | Status: DC | PRN

## 2016-02-02 MED ORDER — SCOPOLAMINE 1 MG/3DAYS TD PT72
MEDICATED_PATCH | TRANSDERMAL | Status: DC | PRN
  Administered 2016-02-02: 1.5 mg via TRANSDERMAL

## 2016-02-02 MED ORDER — TIZANIDINE HCL 4 MG PO TABS: 4.0000 mg | ORAL_TABLET | Freq: Four times a day (QID) | ORAL | Status: DC | PRN

## 2016-02-02 MED ORDER — ACETAMINOPHEN 325 MG PO TABS: 650.0000 mg | ORAL_TABLET | ORAL | Status: DC | PRN

## 2016-02-02 MED ORDER — CEFAZOLIN SODIUM-DEXTROSE 2-4 GM/100ML-% IV SOLN
INTRAVENOUS | Status: AC
  Filled 2016-02-02: qty 100

## 2016-02-02 MED ORDER — PROPOFOL 10 MG/ML IV BOLUS
INTRAVENOUS | Status: DC | PRN
  Administered 2016-02-02: 150 mg via INTRAVENOUS

## 2016-02-02 MED ORDER — NORTRIPTYLINE HCL 25 MG PO CAPS
25.0000 mg | ORAL_CAPSULE | Freq: Every day | ORAL | Status: DC
  Administered 2016-02-02: 25 mg via ORAL
  Filled 2016-02-02: qty 1

## 2016-02-02 MED ORDER — PROPOFOL 10 MG/ML IV BOLUS
INTRAVENOUS | Status: AC
  Filled 2016-02-02: qty 20

## 2016-02-02 MED ORDER — MIDAZOLAM HCL 5 MG/5ML IJ SOLN
INTRAMUSCULAR | Status: DC | PRN
  Administered 2016-02-02: .5 mg via INTRAVENOUS

## 2016-02-02 MED ORDER — DIAZEPAM 5 MG PO TABS: 5.0000 mg | ORAL_TABLET | Freq: Four times a day (QID) | ORAL | Status: DC | PRN

## 2016-02-02 MED ORDER — LIDOCAINE 2% (20 MG/ML) 5 ML SYRINGE
INTRAMUSCULAR | Status: AC
  Filled 2016-02-02: qty 5

## 2016-02-02 MED ORDER — SODIUM CHLORIDE 0.9 % IV SOLN: 250.0000 mL | INTRAVENOUS | Status: DC

## 2016-02-02 MED ORDER — MORPHINE SULFATE (PF) 2 MG/ML IV SOLN: 1.0000 mg | INTRAVENOUS | Status: DC | PRN

## 2016-02-02 MED ORDER — ROCURONIUM BROMIDE 100 MG/10ML IV SOLN
INTRAVENOUS | Status: DC | PRN
  Administered 2016-02-02: 50 mg via INTRAVENOUS

## 2016-02-02 MED ORDER — MENTHOL 3 MG MT LOZG
1.0000 | LOZENGE | OROMUCOSAL | Status: DC | PRN
Start: 2016-02-02 — End: 2016-02-03

## 2016-02-02 MED ORDER — SODIUM CHLORIDE 0.9% FLUSH
3.0000 mL | Freq: Two times a day (BID) | INTRAVENOUS | Status: DC
  Administered 2016-02-03: 3 mL via INTRAVENOUS

## 2016-02-02 MED ORDER — HYDROCODONE-ACETAMINOPHEN 5-325 MG PO TABS
1.0000 | ORAL_TABLET | Freq: Four times a day (QID) | ORAL | Status: DC | PRN
Start: 2016-02-02 — End: 2016-02-03
  Administered 2016-02-03: 1 via ORAL
  Filled 2016-02-02: qty 1

## 2016-02-02 MED ORDER — THROMBIN 5000 UNITS EX SOLR
CUTANEOUS | Status: AC
  Filled 2016-02-02: qty 5000

## 2016-02-02 MED ORDER — PHENYLEPHRINE HCL 10 MG/ML IJ SOLN
INTRAVENOUS | Status: DC | PRN
  Administered 2016-02-02: 40 ug/min via INTRAVENOUS

## 2016-02-02 MED ORDER — SERTRALINE HCL 50 MG PO TABS
50.0000 mg | ORAL_TABLET | Freq: Every evening | ORAL | Status: DC
  Administered 2016-02-02: 50 mg via ORAL
  Filled 2016-02-02: qty 1

## 2016-02-02 MED ORDER — ACETAMINOPHEN 650 MG RE SUPP: 650.0000 mg | RECTAL | Status: DC | PRN

## 2016-02-02 MED ORDER — ONDANSETRON HCL 4 MG PO TABS: 4.0000 mg | ORAL_TABLET | Freq: Three times a day (TID) | ORAL | Status: DC | PRN

## 2016-02-02 MED ORDER — SCOPOLAMINE 1 MG/3DAYS TD PT72: 1.0000 | MEDICATED_PATCH | TRANSDERMAL | Status: DC

## 2016-02-02 MED ORDER — FENTANYL CITRATE (PF) 100 MCG/2ML IJ SOLN
INTRAMUSCULAR | Status: AC
  Filled 2016-02-02: qty 2

## 2016-02-02 MED ORDER — DOCUSATE SODIUM 100 MG PO CAPS
100.0000 mg | ORAL_CAPSULE | Freq: Two times a day (BID) | ORAL | Status: DC
  Administered 2016-02-02 – 2016-02-03 (×2): 100 mg via ORAL
  Filled 2016-02-02 (×2): qty 1

## 2016-02-02 MED ORDER — ONDANSETRON HCL 4 MG/2ML IJ SOLN
INTRAMUSCULAR | Status: DC | PRN
  Administered 2016-02-02: 4 mg via INTRAVENOUS

## 2016-02-02 MED ORDER — POTASSIUM CHLORIDE IN NACL 20-0.9 MEQ/L-% IV SOLN
INTRAVENOUS | Status: DC
  Administered 2016-02-02: 21:00:00 via INTRAVENOUS
  Filled 2016-02-02 (×2): qty 1000

## 2016-02-02 MED ORDER — LIDOCAINE HCL (CARDIAC) 20 MG/ML IV SOLN
INTRAVENOUS | Status: DC | PRN
  Administered 2016-02-02: 20 mg via INTRAVENOUS

## 2016-02-02 MED ORDER — ROCURONIUM BROMIDE 10 MG/ML (PF) SYRINGE
PREFILLED_SYRINGE | INTRAVENOUS | Status: AC
  Filled 2016-02-02: qty 10

## 2016-02-02 MED ORDER — MUPIROCIN 2 % EX OINT
TOPICAL_OINTMENT | CUTANEOUS | Status: AC
  Administered 2016-02-02: 1
  Filled 2016-02-02: qty 22

## 2016-02-02 MED ORDER — NITROGLYCERIN 0.4 MG SL SUBL: 0.4000 mg | SUBLINGUAL_TABLET | SUBLINGUAL | Status: DC | PRN

## 2016-02-02 MED ORDER — MEPERIDINE HCL 25 MG/ML IJ SOLN: 6.2500 mg | INTRAMUSCULAR | Status: DC | PRN

## 2016-02-02 MED ORDER — LACTATED RINGERS IV SOLN
INTRAVENOUS | Status: DC
  Administered 2016-02-02: 50 mL/h via INTRAVENOUS

## 2016-02-02 MED ORDER — MIDAZOLAM HCL 2 MG/2ML IJ SOLN
INTRAMUSCULAR | Status: AC
  Filled 2016-02-02: qty 2

## 2016-02-02 MED ORDER — SUGAMMADEX SODIUM 200 MG/2ML IV SOLN
INTRAVENOUS | Status: DC | PRN
  Administered 2016-02-02: 150 mg via INTRAVENOUS

## 2016-02-02 MED ORDER — BUPIVACAINE-EPINEPHRINE (PF) 0.5% -1:200000 IJ SOLN
INTRAMUSCULAR | Status: DC | PRN
  Administered 2016-02-02 (×2): 10 mL

## 2016-02-02 MED ORDER — THROMBIN 5000 UNITS EX SOLR
CUTANEOUS | Status: DC | PRN
  Administered 2016-02-02 (×2): 5000 [IU] via TOPICAL

## 2016-02-02 MED ORDER — GABAPENTIN 300 MG PO CAPS
300.0000 mg | ORAL_CAPSULE | Freq: Three times a day (TID) | ORAL | Status: DC
  Administered 2016-02-02 – 2016-02-03 (×2): 300 mg via ORAL
  Filled 2016-02-02 (×2): qty 1

## 2016-02-02 MED ORDER — HEMOSTATIC AGENTS (NO CHARGE) OPTIME
TOPICAL | Status: DC | PRN
  Administered 2016-02-02: 1 via TOPICAL

## 2016-02-02 MED ORDER — SUGAMMADEX SODIUM 200 MG/2ML IV SOLN
INTRAVENOUS | Status: AC
  Filled 2016-02-02: qty 2

## 2016-02-02 SURGICAL SUPPLY — 44 items
BAG DECANTER FOR FLEXI CONT (MISCELLANEOUS) ×2 IMPLANT
BENZOIN TINCTURE PRP APPL 2/3 (GAUZE/BANDAGES/DRESSINGS) IMPLANT
BLADE CLIPPER SURG (BLADE) IMPLANT
BUR MATCHSTICK NEURO 3.0 LAGG (BURR) ×2 IMPLANT
CANISTER SUCT 3000ML PPV (MISCELLANEOUS) ×2 IMPLANT
DECANTER SPIKE VIAL GLASS SM (MISCELLANEOUS) ×2 IMPLANT
DERMABOND ADVANCED (GAUZE/BANDAGES/DRESSINGS) ×1
DERMABOND ADVANCED .7 DNX12 (GAUZE/BANDAGES/DRESSINGS) ×1 IMPLANT
DRAPE LAPAROTOMY 100X72X124 (DRAPES) ×2 IMPLANT
DRAPE MICROSCOPE LEICA (MISCELLANEOUS) ×2 IMPLANT
DRAPE POUCH INSTRU U-SHP 10X18 (DRAPES) ×2 IMPLANT
DRAPE SURG 17X23 STRL (DRAPES) ×2 IMPLANT
DURAPREP 26ML APPLICATOR (WOUND CARE) ×2 IMPLANT
ELECT REM PT RETURN 9FT ADLT (ELECTROSURGICAL) ×2
ELECTRODE REM PT RTRN 9FT ADLT (ELECTROSURGICAL) ×1 IMPLANT
GAUZE SPONGE 4X4 12PLY STRL (GAUZE/BANDAGES/DRESSINGS) IMPLANT
GAUZE SPONGE 4X4 16PLY XRAY LF (GAUZE/BANDAGES/DRESSINGS) IMPLANT
GLOVE ECLIPSE 6.5 STRL STRAW (GLOVE) ×2 IMPLANT
GLOVE EXAM NITRILE LRG STRL (GLOVE) IMPLANT
GLOVE EXAM NITRILE XL STR (GLOVE) IMPLANT
GLOVE EXAM NITRILE XS STR PU (GLOVE) IMPLANT
GOWN STRL REUS W/ TWL LRG LVL3 (GOWN DISPOSABLE) ×2 IMPLANT
GOWN STRL REUS W/ TWL XL LVL3 (GOWN DISPOSABLE) IMPLANT
GOWN STRL REUS W/TWL 2XL LVL3 (GOWN DISPOSABLE) IMPLANT
GOWN STRL REUS W/TWL LRG LVL3 (GOWN DISPOSABLE) ×2
GOWN STRL REUS W/TWL XL LVL3 (GOWN DISPOSABLE)
KIT BASIN OR (CUSTOM PROCEDURE TRAY) ×2 IMPLANT
KIT ROOM TURNOVER OR (KITS) ×2 IMPLANT
NEEDLE HYPO 25X1 1.5 SAFETY (NEEDLE) ×2 IMPLANT
NEEDLE SPNL 18GX3.5 QUINCKE PK (NEEDLE) IMPLANT
NS IRRIG 1000ML POUR BTL (IV SOLUTION) ×2 IMPLANT
PACK LAMINECTOMY NEURO (CUSTOM PROCEDURE TRAY) ×2 IMPLANT
PAD ARMBOARD 7.5X6 YLW CONV (MISCELLANEOUS) ×6 IMPLANT
RUBBERBAND STERILE (MISCELLANEOUS) ×4 IMPLANT
SPONGE LAP 4X18 X RAY DECT (DISPOSABLE) IMPLANT
SPONGE SURGIFOAM ABS GEL SZ50 (HEMOSTASIS) ×2 IMPLANT
STRIP CLOSURE SKIN 1/2X4 (GAUZE/BANDAGES/DRESSINGS) IMPLANT
SUT VIC AB 0 CT1 18XCR BRD8 (SUTURE) ×1 IMPLANT
SUT VIC AB 0 CT1 8-18 (SUTURE) ×1
SUT VIC AB 2-0 CT1 18 (SUTURE) ×2 IMPLANT
SUT VIC AB 3-0 SH 8-18 (SUTURE) ×2 IMPLANT
TOWEL OR 17X24 6PK STRL BLUE (TOWEL DISPOSABLE) ×2 IMPLANT
TOWEL OR 17X26 10 PK STRL BLUE (TOWEL DISPOSABLE) ×2 IMPLANT
WATER STERILE IRR 1000ML POUR (IV SOLUTION) ×2 IMPLANT

## 2016-02-02 NOTE — H&P (Signed)
BP (!) 163/85   Pulse 82   Temp 97.5 F (36.4 C) (Oral)   Resp 20   Wt 77.6 kg (171 lb)   SpO2 97%   BMI 24.54 kg/m      [name] presents today for evaluation of pain he has in his back and right lower extremity.  He has had pain for years but has had excruciating pain since December 29, 2015.  He has had no antecedent trauma.  He was sitting on a plane on the way to [facility].  When he landed he found himself in excruciating pain, barely able to walk.  The pain has not subsided since that time.  He has had a Prednisone taper, he has had antiinflammatories in this time, and there has been absolutely no improvement.  He feels that the right lower extremity, while not necessarily weak, does feel different.  The pain is in the buttocks, hip, and then into the thigh and leg.  He says the pain is sharp and feels somewhat sickening.  In his words, it really hurts.  He says it is improved with lying down and heat.  He says it is worse with anything other than heat or lying down.  No bowel or bladder dysfunction.  He used to wake up each morning and walk a great deal.  He does not walk at all.  He has never had pain like this in the past.  He does take muscle relaxants and pain pills but finds both far too sedating.  He is 82 years of age.  He is right-handed.  He has had two bouts of vertigo this past year.  He did take a Medrol Dosepak, which was a Prednisone dose pack, which did not help.     As I stated, Troy Combs is 82 years of age.     REVIEW OF SYSTEMS:                                    Review of systems is positive for cataracts and surgery on that, hearing loss, tinnitus, balance problems, anosmia, back pain, arm pain, leg pain.  He has had tinnitus for many years.  PAST MEDICAL HISTORY:                                Past medical history includes hypertension, left shoulder cancer.  He has had repair of a ruptured liver in 1952.  He has had a four-vessel coronary artery bypass graft  surgery in 2002.                  Medications and Allergies:  Medication list is as follows:  Albuterol, Aspirin 81 mg, Carvedilol, Cetirizine, Finasteride, Gabapentin, Neomycin, Nitroglycerin, Nortriptyline, Rosuvastatin, Sertraline, Tamsulosin.     FAMILY HISTORY:                                            Mother and father are both deceased.     SOCIAL HISTORY:  He does not smoke, does drink alcohol socially and no history of substance abuse.     PHYSICAL EXAMINATION:                                Vitals are as follows:  Height 5 feet 10 inches, weight 172 pounds, temperature is 95.6 degrees Fahrenheit, blood pressure is 113/74, pulse 47, respiratory rate 14, pain is 1/10.     NEUROLOGICAL EXAMINATION:                       On exam he is alert, oriented x 4, and answering all questions appropriately.  Memory, language, attention span and fund of knowledge are normal.  Speech is clear and fluent.  Hearing intact to voice.  Uvula elevates in the midline.  Shoulder shrug is normal.  Tongue protrudes in the midline.  He has 5/5 strength in the upper and lower extremities, 5-/5 at the right dorsiflexors.  He has 2+ reflexes biceps, triceps, knees and ankles bilaterally.  Romberg is negative.  He is able to toe walk and heel walk.  Gait is otherwise mildly antalgic.  He uses a walking stick and favors his right lower extremity.  Proprioception is intact in the lower extremities.  No clonus.  No Hoffman's sign.  Toes are downgoing to plantar stimulation.     IMAGING STUDIES:                                          MRI shows degeneration present at L2-3, 4-5, some at 5-1, some at 3-4; but the worst at 2-3 and 4-5.  I think there is a little bit of listhesis and fish mouthing at 4-5.  He has a herniated disk eccentric to the right in the foramen and the canal itself.  There is significant facet hypertrophy, right side worse than left, and lateral  recess stenosis.  Conus is normal.  Cauda equina is normal.     IMPRESSION/PLAN:                                         I believe that the most effective form of treatment is surgery.  He certainly could get an injection and we spoke about that.  He does not wish to pursue that type of treatment.  I do not think physical therapy will be of much use secondary to the severe pain that he is already in.  I do believe surgery involving a diskectomy would be helpful.  I do not believe that he needs a fusion at this time.  He is not complaining of back pain, but radicular pain.  Risks and benefits, bleeding, infection, no relief, need for further surgery, recurrence were discussed along with other risks.  He also received a detailed instruction sheet.  He would like to proceed and we will schedule him for this coming Friday.

## 2016-02-02 NOTE — Anesthesia Preprocedure Evaluation (Signed)
Anesthesia Evaluation  Patient identified by MRN, date of birth, ID band Patient awake    Reviewed: Allergy & Precautions, NPO status , Patient's Chart, lab work & pertinent test results  Airway Mallampati: II  TM Distance: >3 FB Neck ROM: Full    Dental no notable dental hx.    Pulmonary former smoker,    Pulmonary exam normal breath sounds clear to auscultation       Cardiovascular hypertension, Pt. on medications + CAD, + Past MI and + CABG (2002)  Normal cardiovascular exam Rhythm:Regular Rate:Normal     Neuro/Psych negative neurological ROS  negative psych ROS   GI/Hepatic negative GI ROS, Neg liver ROS,   Endo/Other  negative endocrine ROS  Renal/GU negative Renal ROS  negative genitourinary   Musculoskeletal negative musculoskeletal ROS (+)   Abdominal   Peds negative pediatric ROS (+)  Hematology negative hematology ROS (+)   Anesthesia Other Findings   Reproductive/Obstetrics negative OB ROS                             Anesthesia Physical Anesthesia Plan  ASA: II  Anesthesia Plan: General   Post-op Pain Management:    Induction: Intravenous  Airway Management Planned: Oral ETT  Additional Equipment:   Intra-op Plan:   Post-operative Plan: Extubation in OR  Informed Consent: I have reviewed the patients History and Physical, chart, labs and discussed the procedure including the risks, benefits and alternatives for the proposed anesthesia with the patient or authorized representative who has indicated his/her understanding and acceptance.   Dental advisory given  Plan Discussed with: CRNA  Anesthesia Plan Comments:         Anesthesia Quick Evaluation

## 2016-02-02 NOTE — Op Note (Signed)
02/02/2016  5:30 PM  PATIENT:  Troy Combs  82 y.o. male with a large hnp at L4/5 eccentric to the right side. He has opted for operative decompression.   PRE-OPERATIVE DIAGNOSIS: right  DISC DISPLACEMENT, LUMBAR 4/5  POST-OPERATIVE DIAGNOSIS: right DISC DISPLACEMENT, LUMBAR 4/5  PROCEDURE:  Procedure(s): MICRODISCECTOMY LUMBAR FOUR- LUMBAR FIVE right  SURGEON:   Surgeon(s): Ashok Pall, MD Eustace Moore, MD  ASSISTANTS:Jones, Shanon Brow  ANESTHESIA:   general  EBL:  Total I/O In: 2000 [I.V.:2000] Out: 39 [Blood:45]  BLOOD ADMINISTERED:none  CELL SAVER GIVEN:none  COUNT:per nursing  DRAINS: none   SPECIMEN:  No Specimen  DICTATION: Troy Combs was taken to the operating room, intubated and placed under a general anesthetic without difficulty. He was positioned prone on a Wilson frame with all pressure points padded. His back was prepped and draped in a sterile manner. I opened the skin with a 10 blade and carried the dissection down to the thoracolumbar fascia. I used both sharp dissection and the monopolar cautery to expose the lamina of L4, and L5. I confirmed my location with an intraoperative xray.  I used the drill, Kerrison punches, and curettes to perform a semihemilaminectomy of L4. I used the punches to remove the ligamentum flavum to expose the thecal sac. I brought the microscope into the operative field and with Dr.Jones' assistance we started our decompression of the spinal canal, thecal sac and L4, and L5 root(s). I cauterized epidural veins overlying the disc space then divided them sharply. I opened the disc space with a 15 blade and proceeded with the discectomy. I used pituitary rongeurs, curettes, and other instruments to remove disc material. After the discectomy was completed we inspected the L4, and L5 nerve roots and felt they were well decompressed. I explored rostrally, laterally, medially, and caudally and was satisfied with the decompression. I  irrigated the wound, then closed in layers. I approximated the thoracolumbar fascia, subcutaneous, and subcuticular planes with vicryl sutures. I used dermabond for a sterile dressing.   PLAN OF CARE: Admit for overnight observation  PATIENT DISPOSITION:  PACU - hemodynamically stable.   Delay start of Pharmacological VTE agent (>24hrs) due to surgical blood loss or risk of bleeding:  yes

## 2016-02-02 NOTE — Anesthesia Procedure Notes (Signed)
Procedure Name: Intubation Date/Time: 02/02/2016 4:04 PM Performed by: Izora Gala Pre-anesthesia Checklist: Patient identified, Emergency Drugs available, Suction available and Patient being monitored Patient Re-evaluated:Patient Re-evaluated prior to inductionOxygen Delivery Method: Circle system utilized Preoxygenation: Pre-oxygenation with 100% oxygen Intubation Type: IV induction Ventilation: Mask ventilation without difficulty Laryngoscope Size: Glidescope and 3 Grade View: Grade I Tube type: Oral Tube size: 7.5 mm Number of attempts: 2 (One attempt with Miller 3) Airway Equipment and Method: Stylet,  Video-laryngoscopy and Bite block Placement Confirmation: ETT inserted through vocal cords under direct vision,  positive ETCO2 and breath sounds checked- equal and bilateral Secured at: 22 cm Tube secured with: Tape Dental Injury: Teeth and Oropharynx as per pre-operative assessment  Comments: Direct laryngoscopy attempted with Sabra Heck 3.  Unable to visualize due to bilateral tori inhibiting compression of tongue.  Easy visualization with Glidescope.

## 2016-02-02 NOTE — Transfer of Care (Signed)
Immediate Anesthesia Transfer of Care Note  Patient: Troy Combs  Procedure(s) Performed: Procedure(s) with comments: MICRODISCECTOMY LUMBAR FOUR- LUMBAR FIVE (N/A) - MICRODISCECTOMY L4-L5  Patient Location: PACU  Anesthesia Type:General  Level of Consciousness: awake, oriented and patient cooperative  Airway & Oxygen Therapy: Patient Spontanous Breathing and Patient connected to nasal cannula oxygen  Post-op Assessment: Report given to RN, Post -op Vital signs reviewed and stable and Patient moving all extremities  Post vital signs: Reviewed and stable  Last Vitals:  Vitals:   02/02/16 0933 02/02/16 1737  BP: (!) 163/85   Pulse: 82   Resp: 20   Temp: 36.4 C (P) 36.4 C    Last Pain:  Vitals:   02/02/16 0933  TempSrc: Oral      Patients Stated Pain Goal: (P) 0 ([id number])  Complications: No apparent anesthesia complications

## 2016-02-02 NOTE — Anesthesia Preprocedure Evaluation (Signed)
Anesthesia Evaluation    Airway        Dental   Pulmonary former smoker,           Cardiovascular hypertension, Pt. on medications + CAD, + Past MI and + CABG (2002)       Neuro/Psych    GI/Hepatic   Endo/Other    Renal/GU      Musculoskeletal   Abdominal   Peds  Hematology   Anesthesia Other Findings   Reproductive/Obstetrics                             Anesthesia Physical Anesthesia Plan Anesthesia Quick Evaluation

## 2016-02-03 DIAGNOSIS — M5116 Intervertebral disc disorders with radiculopathy, lumbar region: Secondary | ICD-10-CM | POA: Diagnosis not present

## 2016-02-03 MED ORDER — OXYCODONE-ACETAMINOPHEN 5-325 MG PO TABS: 1.0000 | ORAL_TABLET | ORAL | 0 refills | Status: DC | PRN

## 2016-02-03 MED ORDER — DIAZEPAM 5 MG PO TABS: 5.0000 mg | ORAL_TABLET | Freq: Four times a day (QID) | ORAL | 0 refills | Status: DC | PRN

## 2016-02-03 NOTE — Discharge Instructions (Signed)
Microdiskectomy, Care After These instructions give you information on caring for yourself after your procedure. Your doctor may also give you more specific instructions. Call your doctor if you have any problems or questions after your procedure. HOME CARE  Put ice on the wound area.  Put ice in a plastic bag.  Place a towel between your skin and the bag.  Leave the ice on for 20 minutes every hour. You may need to do this for several days.  Only take medicines as told by your doctor.  Do not get the wound wet until your doctor says it is okay.  Check the wound often for redness, puffiness (swelling), or leaking fluids. You can use a mirror or ask a family member to check your wound.  Avoid bending, squatting, and crawling. Full recovery can take 4 to 6 weeks.  Walk as much as you can.  Do not sit for long periods of time. Do not pull on things.  Do not drive until your doctor says it is okay.  Do not lift anything heavier than 10 pounds (4.5 kilograms) until your doctor says it is safe.  Ask your doctor when you can go back to normal activities, such as work, driving, or sex.  Ask your doctor what exercises you should do. Exercises can help make the back stronger. You may need physical therapy.  Keep all doctor visits as told. GET HELP RIGHT AWAY IF:  You have any questions about your medicines.  Your pain does not go away, even after taking pain medicine.  You feel sick to your stomach (nauseous).  You cannot poop (constipated) for 2 to 3 days.  Your pain suddenly gets worse, especially if the pain is in your leg.  Your wound is red, puffy, or bleeding.  You have fluid leaking from the wound.  Your legs or feet become painful or puffy.  You have trouble breathing.  You have trouble controlling when you pee (urinate) or poop (bowel movement).  You have chest pain or a fast heartbeat (palpitations).  You have a fever. MAKE SURE YOU:  Understand these  instructions.  Will watch your condition.  Will get help right away if you are not doing well or get worse.   This information is not intended to replace advice given to you by your health care provider. Make sure you discuss any questions you have with your health care provider.   Document Released: 08/09/2010 Document Revised: 05/06/2014 Document Reviewed: 09/28/2014 Elsevier Interactive Patient Education [employer].

## 2016-02-03 NOTE — Progress Notes (Signed)
Patient and spouse given discharge instructions.  IV removed as ordered. All questions and concerns addressed.  Patient left floor with spouse by private vehicle with all belongings.  [employer] of prescriptions given to patient.

## 2016-02-03 NOTE — Discharge Summary (Signed)
Physician Discharge Summary  Patient ID: Troy Combs MRN: [id number] DOB/AGE: Mar 25, 1932 82 y.o.  Admit date: 02/02/2016 Discharge date: 02/03/2016  Admission Diagnoses:Herniated nucleus pulposus L4-L5 right with lumbar radiculopathy Discharge Diagnoses: Herniated nucleus pulposus L4-L5 right with lumbar radiculopathy Active Problems:   HNP (herniated nucleus pulposus), lumbar   Discharged Condition: good  Hospital Course: Patient underwent surgical decompression and tolerated well his motor function has been good. Incision is clean and dry.  Consults: None  Significant Diagnostic Studies: None  Treatments: surgery: Microdiscectomy L4-5 right with operating microscope dissection technique  Discharge Exam: Blood pressure 139/82, pulse 72, temperature 97.5 F (36.4 C), temperature source Oral, resp. rate 20, height 5\' 10"  (1.778 m), weight 77.6 kg (171 lb 1.2 oz), SpO2 95 %. Incision is clean and dry.  Disposition: 01-Home or Self Care  Discharge Instructions    Call MD for:  redness, tenderness, or signs of infection (pain, swelling, redness, odor or green/yellow discharge around incision site)    Complete by:  As directed    Call MD for:  severe uncontrolled pain    Complete by:  As directed    Call MD for:  temperature >100.4    Complete by:  As directed    Diet - low sodium heart healthy    Complete by:  As directed    Increase activity slowly    Complete by:  As directed        Medication List    TAKE these medications   albuterol 108 (90 Base) MCG/ACT inhaler Commonly known as:  PROVENTIL HFA;VENTOLIN HFA Inhale 2 puffs into the lungs every 6 (six) hours as needed for wheezing or shortness of breath.   aspirin EC 81 MG tablet Take 81 mg by mouth every evening.   carvedilol 3.125 MG tablet Commonly known as:  COREG Take 3.125 mg by mouth at bedtime.   cetirizine 10 MG tablet Commonly known as:  ZYRTEC Take 1 tablet (10 mg total) by mouth Troy. What  changed:  when to take this   diazepam 5 MG tablet Commonly known as:  VALIUM Take 1 tablet (5 mg total) by mouth every 6 (six) hours as needed for muscle spasms.   finasteride 5 MG tablet Commonly known as:  PROSCAR Take 5 mg by mouth every evening.   gabapentin 300 MG capsule Commonly known as:  NEURONTIN 1-2 tabs by mouth three times Troy What changed:  how much to take  how to take this  when to take this  additional instructions   HYDROcodone-acetaminophen 5-325 MG tablet Commonly known as:  NORCO/VICODIN Take 1 tablet by mouth every 6 (six) hours as needed for moderate pain.   meclizine 12.5 MG tablet Commonly known as:  ANTIVERT Take 1 tablet (12.5 mg total) by mouth 3 (three) times Troy as needed for dizziness.   NEOMYCIN-POLYMYXIN-HYDROCORTISONE 1 % Soln otic solution Commonly known as:  CORTISPORIN Place 3 drops into the right ear every 6 (six) hours.   nitroGLYCERIN 0.4 MG SL tablet Commonly known as:  NITROSTAT Place 1 tablet (0.4 mg total) under the tongue every 5 (five) minutes as needed for chest pain (x 3 doses). Reported on 06/15/2015   nortriptyline 25 MG capsule Commonly known as:  PAMELOR Take 1 capsule (25 mg total) by mouth at bedtime. Yearly physical is due in Sept must see MD for future refills   ondansetron 4 MG tablet Commonly known as:  ZOFRAN Take 1 tablet (4 mg total) by mouth every 8 (eight)  hours as needed for nausea or vomiting.   oxyCODONE-acetaminophen 5-325 MG tablet Commonly known as:  PERCOCET/ROXICET Take 1-2 tablets by mouth every 4 (four) hours as needed for moderate pain.   predniSONE 10 MG tablet Commonly known as:  DELTASONE 3 tabs by mouth per day for 3 days,2tabs per day for 3 days,1tab per day for 3 days   rosuvastatin 10 MG tablet Commonly known as:  CRESTOR TAKE 1 TABLET Troy What changed:  See the new instructions.   scopolamine 1 MG/3DAYS Commonly known as:  TRANSDERM-SCOP (1.5 MG) Place 1 patch (1.5 mg  total) onto the skin every 3 (three) days.   sertraline 50 MG tablet Commonly known as:  ZOLOFT TAKE 1 TABLET Troy What changed:  See the new instructions.   tamsulosin 0.4 MG Caps capsule Commonly known as:  FLOMAX TAKE 1 CAPSULE Troy What changed:  See the new instructions.   tiZANidine 4 MG tablet Commonly known as:  ZANAFLEX Take 1 tablet (4 mg total) by mouth every 6 (six) hours as needed for muscle spasms.        SignedEarleen Newport 02/03/2016, 10:28 AM

## 2016-02-05 ENCOUNTER — Encounter (HOSPITAL_COMMUNITY): Payer: Self-pay | Admitting: Neurosurgery

## 2016-02-05 NOTE — Anesthesia Postprocedure Evaluation (Signed)
Anesthesia Post Note  Patient: Troy Combs  Procedure(s) Performed: Procedure(s) (LRB): MICRODISCECTOMY LUMBAR FOUR- LUMBAR FIVE (N/A)  Patient location during evaluation: PACU Anesthesia Type: General Level of consciousness: awake and alert Pain management: pain level controlled Vital Signs Assessment: post-procedure vital signs reviewed and stable Respiratory status: spontaneous breathing, nonlabored ventilation, respiratory function stable and patient connected to nasal cannula oxygen Cardiovascular status: blood pressure returned to baseline and stable Postop Assessment: no signs of nausea or vomiting Anesthetic complications: no    Last Vitals:  Vitals:   02/03/16 0547 02/03/16 1008  BP: (!) 163/87 139/82  Pulse: 89 72  Resp: 18 20  Temp: 37.6 C 36.4 C    Last Pain:  Vitals:   02/03/16 1008  TempSrc: Oral  PainSc:                  Loveta Dellis S

## 2016-03-13 ENCOUNTER — Other Ambulatory Visit: Payer: Self-pay | Admitting: Internal Medicine

## 2016-04-02 ENCOUNTER — Other Ambulatory Visit: Payer: Self-pay | Admitting: Internal Medicine

## 2016-04-13 ENCOUNTER — Encounter: Payer: Self-pay | Admitting: Internal Medicine

## 2016-04-13 ENCOUNTER — Other Ambulatory Visit: Payer: Self-pay | Admitting: Cardiology

## 2016-04-13 ENCOUNTER — Other Ambulatory Visit: Payer: Self-pay | Admitting: Internal Medicine

## 2016-04-15 MED ORDER — ROSUVASTATIN CALCIUM 10 MG PO TABS: 10.0000 mg | ORAL_TABLET | Freq: Every day | ORAL | 2 refills | Status: DC

## 2016-04-15 MED ORDER — NORTRIPTYLINE HCL 25 MG PO CAPS: 25.0000 mg | ORAL_CAPSULE | Freq: Every day | ORAL | 2 refills | Status: DC

## 2016-05-02 ENCOUNTER — Telehealth (HOSPITAL_COMMUNITY): Payer: Self-pay | Admitting: *Deleted

## 2016-05-02 ENCOUNTER — Ambulatory Visit (INDEPENDENT_AMBULATORY_CARE_PROVIDER_SITE_OTHER): Payer: Medicare Other | Admitting: Cardiology

## 2016-05-02 VITALS — BP 130/80 | HR 74 | Ht 70.0 in | Wt 173.0 lb

## 2016-05-02 DIAGNOSIS — R0600 Dyspnea, unspecified: Secondary | ICD-10-CM

## 2016-05-02 DIAGNOSIS — I10 Essential (primary) hypertension: Secondary | ICD-10-CM

## 2016-05-02 DIAGNOSIS — Z951 Presence of aortocoronary bypass graft: Secondary | ICD-10-CM | POA: Diagnosis not present

## 2016-05-02 DIAGNOSIS — E782 Mixed hyperlipidemia: Secondary | ICD-10-CM

## 2016-05-02 DIAGNOSIS — R0609 Other forms of dyspnea: Secondary | ICD-10-CM

## 2016-05-02 DIAGNOSIS — I493 Ventricular premature depolarization: Secondary | ICD-10-CM

## 2016-05-02 MED ORDER — ROSUVASTATIN CALCIUM 5 MG PO TABS: 5.0000 mg | ORAL_TABLET | Freq: Every day | ORAL | 3 refills | Status: DC

## 2016-05-02 NOTE — Patient Instructions (Signed)
Medication Instructions:   DECREASE YOUR ROSUVASTATIN TO 5 MG ONCE DAILY    Testing/Procedures:  Your physician has requested that you have a lexiscan myoview. For further information please visit [email or web address]. Please follow instruction sheet, as given.     Follow-Up:  Your physician wants you to follow-up in: [place] will receive a reminder letter in the mail two months in advance. If you don't receive a letter, please call our office to schedule the follow-up appointment.        If you need a refill on your cardiac medications before your next appointment, please call your pharmacy.

## 2016-05-02 NOTE — Telephone Encounter (Signed)
Patient given detailed instructions per Myocardial Perfusion Study Information Sheet for the test on 05/07/16 at 0745. Patient notified to arrive 15 minutes early and that it is imperative to arrive on time for appointment to keep from having the test rescheduled.  If you need to cancel or reschedule your appointment, please call the office within 24 hours of your appointment. Failure to do so may result in a cancellation of your appointment, and a $50 no show fee. Patient verbalized understanding.Earl Losee, Ranae [place]

## 2016-05-02 NOTE — Progress Notes (Signed)
Patient ID: Troy Combs, male   DOB: Sep 15, 1931, 83 y.o.   MRN: [id number]      Cardiology Office Note   Date:  05/02/2016   ID:  Troy Combs, DOB May 01, 1931, MRN [id number]  PCP:  [name], MD  Cardiologist:  Former patient of Dr. Nena Alexander, MD   Chief complain: DOE, exertional dizziness  History of Present Illness: Troy Combs is a very pleasant 83 y.o. male, a Probation officer, with h/o CAD, MI and CABG x 4 in 2002, HTN, who was previously seen by Dr Verl Blalock but hasn't followed for the last 4 years. The last cath in 5/10 with patent L-LAD, S-RCA, S-OM1/OM2, EF 60%, atrial flutter, known orthostatic hypotension.  His last echo in 4/12: Ef 55-65%, grade 1 diast dysfxn, mild focal basal hypertrophy of the septum, mild LAE. Last myoview 8/10: apical thinning with mild apical ischemia, EF 65% (low risk).  In the past his Toprol XL was increased to 100 mg PO daily for symptomatic PVCs. Recently decreased to 50 mg po daily for fatigue and dizziness.  He comes today and denies any chest pain, however he exercises on almost daily basis and has noticed that he gets profoundly more SOB on exertion and also feels dizzy.  He has known orthostatic hypotension, he was advised to increase fluid and salt intake in the past but he hasn't been doing it. He doesn't like to drink water and drinks a lot coffee. No syncope. One one occasion he noticed short episode of HR 150 BPM but no presyncope of syncope.   08/25/14 - normal stress test, however chronotropic incompetence and switched to lexiscan,  normal echo, continues to have the same symptoms of dizziness about 1 minute after he starts walking and exertional dizziness.  05/02/2016 - the patient is coming after 6 months, he is afraid her and spends most of his days sitting down and writing. He has noticed some worsening shortness of breath on exertion. He still able to walk 5 times a day with his dog. He denies any chest pain. Lately he  has noticed more frequent PVCs and was started on carvedilol that is much better tolerated as a posterior Toprol-XL to do gave him significant fatigue. He denies any syncope no lower extremity edema or claudications.   Past Medical History:  Diagnosis Date  . Allergic rhinitis 09/20/2014  . Anemia, unspecified   . Arthritis   . Bladder neck obstruction   . Coronary atherosclerosis of unspecified type of vessel, native or graft   . Dysthymic disorder   . Impaired glucose tolerance 12/02/2012  . Impotence of organic origin   . Lumbar radiculopathy   . Malignant melanoma of skin of upper limb, including shoulder ([place])   . Old myocardial infarction   . Personal history of malignant melanoma of skin   . Personal history of other diseases of digestive system   . Thoracic or lumbosacral neuritis or radiculitis, unspecified   . Unspecified essential hypertension     Past Surgical History:  Procedure Laterality Date  . ARTHRODESIS  07/06/2002   of left long finger distal interphalangeal joint with Kirschner wire fixation X 3  . ATRIAL ABLATION SURGERY  05/04/2001   Dr. [name]  . CARDIAC CATHETERIZATION  09/05/2008   Revealing 4 of 4 patent grafts with native multivessel coronary artery disease, EF  of 60% without regional wall motion abnormalities.  Marland Kitchen CATARACT EXTRACTION, BILATERAL    . CHOLECYSTECTOMY    .  COLONOSCOPY    . CORONARY ANGIOPLASTY  1993  . CORONARY ARTERY BYPASS GRAFT  10/17/2000   [name]. [name], [place]     AFTER LIVER TRAUMA AND HAND SURGERY  . HERNIA REPAIR     BILATERAL  . LUMBAR LAMINECTOMY/DECOMPRESSION MICRODISCECTOMY N/A 02/02/2016   Procedure: MICRODISCECTOMY LUMBAR FOUR- LUMBAR FIVE;  Surgeon: Ashok Pall, MD;  Location: [facility];  Service: Neurosurgery;  Laterality: N/A;  MICRODISCECTOMY L4-L5     Current Outpatient Prescriptions  Medication Sig Dispense Refill  . albuterol (PROVENTIL HFA;VENTOLIN HFA) 108 (90 BASE) MCG/ACT  inhaler Inhale 2 puffs into the lungs every 6 (six) hours as needed for wheezing or shortness of breath. 1 Inhaler 11  . aspirin EC 81 MG tablet Take 81 mg by mouth every evening.    . carvedilol (COREG) 3.125 MG tablet Take 1 tablet (3.125 mg total) by mouth at bedtime. 90 tablet 1  . diazepam (VALIUM) 5 MG tablet Take 1 tablet (5 mg total) by mouth every 6 (six) hours as needed for muscle spasms. 30 tablet 0  . finasteride (PROSCAR) 5 MG tablet Take 5 mg by mouth every evening.     Marland Kitchen NEOMYCIN-POLYMYXIN-HYDROCORTISONE (CORTISPORIN) 1 % SOLN otic solution Place 3 drops into the right ear every 6 (six) hours. 10 mL 0  . nortriptyline (PAMELOR) 25 MG capsule Take 1 capsule (25 mg total) by mouth at bedtime. 90 capsule 2  . oxyCODONE-acetaminophen (PERCOCET/ROXICET) 5-325 MG tablet Take 1-2 tablets by mouth every 4 (four) hours as needed for moderate pain. 60 tablet 0  . rosuvastatin (CRESTOR) 10 MG tablet Take 1 tablet (10 mg total) by mouth daily. 90 tablet 2  . sertraline (ZOLOFT) 50 MG tablet TAKE 1 TABLET DAILY (Patient taking differently: TAKE 1 TABLET DAILY IN THE EVENING) 90 tablet 3  . tamsulosin (FLOMAX) 0.4 MG CAPS capsule TAKE 1 CAPSULE DAILY 90 capsule 1  . gabapentin (NEURONTIN) 300 MG capsule 1-2 tabs by mouth three times daily (Patient not taking: Reported on 05/02/2016) 180 capsule 5  . nitroGLYCERIN (NITROSTAT) 0.4 MG SL tablet Place 1 tablet (0.4 mg total) under the tongue every 5 (five) minutes as needed for chest pain (x 3 doses). Reported on 06/15/2015 (Patient not taking: Reported on 05/02/2016) 25 tablet 6   No current facility-administered medications for this visit.     Allergies:   Iohexol; Ciprofloxacin; and Other    Social History:  The patient  reports that he quit smoking about 60 years ago. He has never used smokeless tobacco. He reports that he drinks alcohol. He reports that he does not use drugs.   Family History:  The patient's family history includes Heart disease  in his father.   ROS:  Please see the history of present illness. All other systems are reviewed and negative.   PHYSICAL EXAM: VS:  BP 130/80   Pulse 74   Ht 5\' 10"  (1.778 m)   Wt 173 lb (78.5 kg)   BMI 24.82 kg/m  , BMI Body mass index is 24.82 kg/m. GEN: Well nourished, well developed, in no acute distress  HEENT: normal  Neck: no JVD, carotid bruits, or masses Cardiac: RRR; no murmurs, rubs, or gallops,no edema  Respiratory:  clear to auscultation bilaterally, normal work of breathing GI: soft, nontender, nondistended, + BS MS: no deformity or atrophy  Skin: warm and dry, no rash Neuro:  Strength and sensation are intact Psych: euthymic mood, full affect  EKG:  EKG is ordered  today. The ekg ordered today demonstrates SR, normal ECG, HR 63 BPM  Recent Labs: 05/04/2015: Magnesium 2.0 01/24/2016: ALT 17; BUN 17; Creatinine, Ser 0.96; Hemoglobin 15.8; Platelets 227.0; Potassium 5.4; Sodium 142; TSH 3.15   Lipid Panel    Component Value Date/Time   CHOL 110 01/24/2016 0955   TRIG 133.0 01/24/2016 0955   TRIG 83 03/18/2006 0839   HDL 49.70 01/24/2016 0955   CHOLHDL 2 01/24/2016 0955   VLDL 26.6 01/24/2016 0955   LDLCALC 34 01/24/2016 0955   Wt Readings from Last 3 Encounters:  05/02/16 173 lb (78.5 kg)  02/02/16 171 lb 1.2 oz (77.6 kg)  01/24/16 171 lb 4 oz (77.7 kg)    Echocardiogram: 08/16/2010 Study Conclusions  - Left ventricle: The cavity size was normal. There was mild focal basal hypertrophy of the septum. Systolic function was normal. The estimated ejection fraction was in the range of 55% to 65%. Wall motion was normal; there were no regional wall motion abnormalities. Doppler parameters are consistent with abnormal left ventricular relaxation (grade 1 diastolic dysfunction). - Aortic valve: Trivial regurgitation. - Mitral valve: Calcified annulus. - Left atrium: The atrium was mildly dilated. - Atrial septum: There was redundancy of the septum,  with borderline criteria for aneurysm. - Pulmonary arteries: Systolic pressure was mildly increased. - Pericardium, extracardiac: A trivial pericardial effusion was identified.  Echo: 08/16/2014 Study Conclusions  - Left ventricle: The cavity size was normal. Wall thickness was increased in a pattern of mild LVH. There was mild focal basal hypertrophy of the septum. Systolic function was vigorous. The estimated ejection fraction was in the range of 65% to 70%. Wall motion was normal; there were no regional wall motion abnormalities. Doppler parameters are consistent with abnormal left ventricular relaxation (grade 1 diastolic dysfunction). - Mitral valve: Calcified annulus. Mild prolapse, involving the anterior leaflet and the posterior leaflet.  Impressions: - Vigorous LV function; grade 1 diastolic dysfunction; mild bileaflet MVP; trace MR and TR.  Lexiscan stress test: Impression Exercise Capacity: Lexiscan with low level exercise. BP Response: Normal blood pressure response. Clinical Symptoms: No chest pain. ECG Impression: No significant ST segment change suggestive of ischemia. Comparison with Prior Nuclear Study:Previous report was low risk scan  Overall Impression: Probable normal perfusion and minimal apical thnning. No evidence for significant ischemia or scar. Overall low risk scan  LV Ejection Fraction: 66%. LV Wall Motion: NL LV Function; NL Wall Motion   EKG performed today 11/09/2015 shows normal sinus rhythm with nonspecific ST abnormalities otherwise normal and unchanged from general fifth 2017.   ASSESSMENT AND PLAN:  Very pleasant, younger appearing 83 year old male  18. CAD, s/p CABG, no ischemia on stress test in 2016. He now has worsening dyspnea on exertion, his EKG today is normal however his bypasses or 83 years old and he hasn't has strep test in 2 years, we will repeat. - continue ASA, crestor, carvedilol.  2.  Exertional dizziness - resolved - Carotid ultrasound was normal. - His 48 hour Holter monitor was negative for any significant pauses or arrhythmias.  3. Frequent PVCs - 16.700 in 48 hours in January 2017, no ventricular tachycardias, minimal symptoms, tolerating carvedilol. Normal LVEF so for now we'll just follow.  4. HLP - at goal on crestor 10 mg po daily, she is experiencing new forgetfulness, I will decrease Crestor to 5 mg po daily.  Follow up in 6 months.   Signed, Ena Dawley, MD  05/02/2016 9:47 AM    [facility] [address], [place], [place]  [address] Phone: [phone]; Fax: [phone]

## 2016-05-07 ENCOUNTER — Telehealth: Payer: Self-pay | Admitting: Cardiology

## 2016-05-07 ENCOUNTER — Telehealth: Payer: Self-pay

## 2016-05-07 ENCOUNTER — Ambulatory Visit (HOSPITAL_COMMUNITY): Payer: Medicare Other | Attending: Internal Medicine

## 2016-05-07 ENCOUNTER — Encounter (HOSPITAL_COMMUNITY): Payer: Medicare Other

## 2016-05-07 DIAGNOSIS — I2510 Atherosclerotic heart disease of native coronary artery without angina pectoris: Secondary | ICD-10-CM | POA: Insufficient documentation

## 2016-05-07 DIAGNOSIS — I10 Essential (primary) hypertension: Secondary | ICD-10-CM | POA: Diagnosis not present

## 2016-05-07 DIAGNOSIS — R0609 Other forms of dyspnea: Secondary | ICD-10-CM | POA: Insufficient documentation

## 2016-05-07 DIAGNOSIS — R0600 Dyspnea, unspecified: Secondary | ICD-10-CM

## 2016-05-07 LAB — MYOCARDIAL PERFUSION IMAGING
LV dias vol: 74 mL (ref 62–150)
LV sys vol: 32 mL
Peak HR: 83 {beats}/min
RATE: 0.29
Rest HR: 85 {beats}/min
SDS: 0
SRS: 2
SSS: 2
TID: 0.88

## 2016-05-07 MED ORDER — TECHNETIUM TC 99M TETROFOSMIN IV KIT
10.8000 | PACK | Freq: Once | INTRAVENOUS | Status: AC | PRN
  Administered 2016-05-07: 10.8 via INTRAVENOUS
  Filled 2016-05-07: qty 11

## 2016-05-07 MED ORDER — REGADENOSON 0.4 MG/5ML IV SOLN
0.4000 mg | Freq: Once | INTRAVENOUS | Status: AC
  Administered 2016-05-07: 0.4 mg via INTRAVENOUS

## 2016-05-07 MED ORDER — TECHNETIUM TC 99M TETROFOSMIN IV KIT
32.6000 | PACK | Freq: Once | INTRAVENOUS | Status: AC | PRN
Start: 2016-05-07 — End: 2016-05-07
  Administered 2016-05-07: 32.6 via INTRAVENOUS
  Filled 2016-05-07: qty 33

## 2016-05-07 NOTE — Telephone Encounter (Signed)
The was no answer from the patient. Left message for patient to call back. Myocardial Perfusion Imaging Results ready to be given to the patient.

## 2016-05-07 NOTE — Telephone Encounter (Signed)
-----   Message from Dorothy Spark, MD sent at 05/07/2016  3:00 PM EST ----- Normal stress test, normal LVEF, no ischemia.

## 2016-05-07 NOTE — Telephone Encounter (Signed)
Patient made aware of results. Patient verbalized understanding.

## 2016-05-07 NOTE — Telephone Encounter (Signed)
Pt's wife returning call regarding stress test results-pls call home number

## 2016-05-08 LAB — HM DIABETES EYE EXAM

## 2016-05-09 ENCOUNTER — Encounter: Payer: Self-pay | Admitting: Internal Medicine

## 2016-05-09 NOTE — Progress Notes (Signed)
Abstract done.

## 2016-07-02 ENCOUNTER — Encounter: Payer: Self-pay | Admitting: Internal Medicine

## 2016-07-02 ENCOUNTER — Ambulatory Visit (INDEPENDENT_AMBULATORY_CARE_PROVIDER_SITE_OTHER): Payer: Medicare Other | Admitting: Internal Medicine

## 2016-07-02 VITALS — BP 142/92 | HR 78 | Temp 98.1°F | Ht 70.0 in | Wt 175.0 lb

## 2016-07-02 DIAGNOSIS — M5416 Radiculopathy, lumbar region: Secondary | ICD-10-CM

## 2016-07-02 DIAGNOSIS — R739 Hyperglycemia, unspecified: Secondary | ICD-10-CM

## 2016-07-02 DIAGNOSIS — I10 Essential (primary) hypertension: Secondary | ICD-10-CM

## 2016-07-02 DIAGNOSIS — Z0001 Encounter for general adult medical examination with abnormal findings: Secondary | ICD-10-CM

## 2016-07-02 NOTE — Progress Notes (Signed)
Subjective:    Patient ID: Troy Combs, male    DOB: [birth year], 83 y.o.   MRN: [id number]  HPI   Here to f/u; overall doing ok,  Pt denies chest pain, increasing sob or doe, wheezing, orthopnea, PND, increased LE swelling, palpitations, dizziness or syncope.  Pt denies new neurological symptoms such as new headache, or facial or extremity weakness or numbness.  Pt denies polydipsia, polyuria, or low sugar episode.   Pt denies new neurological symptoms such as new headache, or facial or extremity weakness or numbness.   Pt states overall good compliance with meds, mostly trying to follow appropriate diet, with wt overall stable BP Readings from Last 3 Encounters:  07/02/16 (!) 142/92  05/02/16 130/80  02/03/16 139/82   Wt Readings from Last 3 Encounters:  07/02/16 175 lb (79.4 kg)  05/07/16 173 lb (78.5 kg)  05/02/16 173 lb (78.5 kg)  s/p back surgury - no further leg pain, except for new 66mo returned pain that started agin to the lower back, left side, pelvic tilt makes local pain worse, standing up can make worse and now with numbness of the 3 middle toes.   Has hx of known lumbar spinal stenosis with surgury jan 2018 per Dr Christella Noa.  Not bothering to walk this time, and walks dog several times per day. No weakness or falls. Past Medical History:  Diagnosis Date  . Allergic rhinitis 09/20/2014  . Anemia, unspecified   . Arthritis   . Bladder neck obstruction   . Coronary atherosclerosis of unspecified type of vessel, native or graft   . Dysthymic disorder   . Impaired glucose tolerance 12/02/2012  . Impotence of organic origin   . Lumbar radiculopathy   . Malignant melanoma of skin of upper limb, including shoulder ([place])   . Old myocardial infarction   . Personal history of malignant melanoma of skin   . Personal history of other diseases of digestive system   . Thoracic or lumbosacral neuritis or radiculitis, unspecified   . Unspecified essential hypertension    Past Surgical  History:  Procedure Laterality Date  . ARTHRODESIS  07/06/2002   of left long finger distal interphalangeal joint with Kirschner wire fixation X 3  . ATRIAL ABLATION SURGERY  05/04/2001   Dr. [name]  . CARDIAC CATHETERIZATION  09/05/2008   Revealing 4 of 4 patent grafts with native multivessel coronary artery disease, EF  of 60% without regional wall motion abnormalities.  Marland Kitchen CATARACT EXTRACTION, BILATERAL    . CHOLECYSTECTOMY    . COLONOSCOPY    . CORONARY ANGIOPLASTY  1993  . CORONARY ARTERY BYPASS GRAFT  10/17/2000   [name]. [name], [place]     AFTER LIVER TRAUMA AND HAND SURGERY  . HERNIA REPAIR     BILATERAL  . LUMBAR LAMINECTOMY/DECOMPRESSION MICRODISCECTOMY N/A 02/02/2016   Procedure: MICRODISCECTOMY LUMBAR FOUR- LUMBAR FIVE;  Surgeon: Ashok Pall, MD;  Location: [facility];  Service: Neurosurgery;  Laterality: N/A;  MICRODISCECTOMY L4-L5    reports that he quit smoking about 60 years ago. He has never used smokeless tobacco. He reports that he drinks alcohol. He reports that he does not use drugs. family history includes Heart disease in his father. Allergies  Allergen Reactions  . Iohexol Other (See Comments)     Code: HIVES, Desc: PATENT STATES HE IS ALLERGIC TO IV DYE 09/14/08/RM, Onset Date: [id number]   . Ciprofloxacin Other (See Comments)    Patient unsure just know he  has a reaction to med  . Other     Other reaction(s): Other (See Comments) Angioplasty dye pt. Had "very bad reaction" - unable to recall the specifics   Current Outpatient Prescriptions on File Prior to Visit  Medication Sig Dispense Refill  . albuterol (PROVENTIL HFA;VENTOLIN HFA) 108 (90 BASE) MCG/ACT inhaler Inhale 2 puffs into the lungs every 6 (six) hours as needed for wheezing or shortness of breath. 1 Inhaler 11  . aspirin EC 81 MG tablet Take 81 mg by mouth every evening.    . carvedilol (COREG) 3.125 MG tablet Take 1 tablet (3.125 mg total) by mouth at bedtime. 90  tablet 1  . finasteride (PROSCAR) 5 MG tablet Take 5 mg by mouth every evening.     Marland Kitchen NEOMYCIN-POLYMYXIN-HYDROCORTISONE (CORTISPORIN) 1 % SOLN otic solution Place 3 drops into the right ear every 6 (six) hours. 10 mL 0  . nitroGLYCERIN (NITROSTAT) 0.4 MG SL tablet Place 1 tablet (0.4 mg total) under the tongue every 5 (five) minutes as needed for chest pain (x 3 doses). Reported on 06/15/2015 25 tablet 6  . nortriptyline (PAMELOR) 25 MG capsule Take 1 capsule (25 mg total) by mouth at bedtime. 90 capsule 2  . rosuvastatin (CRESTOR) 5 MG tablet Take 1 tablet (5 mg total) by mouth daily. 90 tablet 3  . sertraline (ZOLOFT) 50 MG tablet TAKE 1 TABLET DAILY (Patient taking differently: TAKE 1 TABLET DAILY IN THE EVENING) 90 tablet 3  . tamsulosin (FLOMAX) 0.4 MG CAPS capsule TAKE 1 CAPSULE DAILY 90 capsule 1  . diazepam (VALIUM) 5 MG tablet Take 1 tablet (5 mg total) by mouth every 6 (six) hours as needed for muscle spasms. (Patient not taking: Reported on 07/02/2016) 30 tablet 0  . gabapentin (NEURONTIN) 300 MG capsule 1-2 tabs by mouth three times daily (Patient not taking: Reported on 05/02/2016) 180 capsule 5   No current facility-administered medications on file prior to visit.     Review of Systems  Constitutional: Negative for unusual diaphoresis or night sweats HENT: Negative for ear swelling or discharge Eyes: Negative for worsening visual haziness  Respiratory: Negative for choking and stridor.   Gastrointestinal: Negative for distension or worsening eructation Genitourinary: Negative for retention or change in urine volume.  Musculoskeletal: Negative for other MSK pain or swelling Skin: Negative for color change and worsening wound Neurological: Negative for tremors and numbness other than noted  Psychiatric/Behavioral: Negative for decreased concentration or agitation other than above    All other system neg per pt    Objective:   Physical Exam BP (!) 142/92   Pulse 78   Temp 98.1 F  (36.7 C)   Ht 5\' 10"  (1.778 m)   Wt 175 lb (79.4 kg)   SpO2 99%   BMI 25.11 kg/m  VS noted,  Constitutional: Pt appears in no apparent distress HENT: Head: NCAT.  Right Ear: External ear normal.  Left Ear: External ear normal.  Eyes: . Pupils are equal, round, and reactive to light. Conjunctivae and EOM are normal Neck: Normal range of motion. Neck supple.  Cardiovascular: Normal rate and regular rhythm.   Pulmonary/Chest: Effort normal and breath sounds without rales or wheezing.  Abd:  Soft, NT, ND, + BS Neurological: Pt is alert. Not confused , motor grossly intact Skin: Skin is warm. No rash, no LE edema Psychiatric: Pt behavior is normal. No agitation.  Spine nontender Neurological: Pt is alert and oriented to person, place, and time. Pt has normal reflexes.  No cranial nerve deficit. Motor grossly intact except + LLE motor 4+/5 Skin: Skin is warm and dry. No rash noted No other exam findings  Lab Results  Component Value Date   WBC 9.8 01/24/2016   HGB 15.8 01/24/2016   HCT 46.0 01/24/2016   PLT 227.0 01/24/2016   GLUCOSE 112 (H) 01/24/2016   CHOL 110 01/24/2016   TRIG 133.0 01/24/2016   HDL 49.70 01/24/2016   LDLCALC 34 01/24/2016   ALT 17 01/24/2016   AST 17 01/24/2016   NA 142 01/24/2016   K 5.4 (H) 01/24/2016   CL 104 01/24/2016   CREATININE 0.96 01/24/2016   BUN 17 01/24/2016   CO2 31 01/24/2016   TSH 3.15 01/24/2016   PSA 0.48 12/02/2012   INR 1.2 09/04/2008   HGBA1C 6.0 01/24/2016       Assessment & Plan:

## 2016-07-02 NOTE — Patient Instructions (Signed)
Please continue all other medications as before, and refills have been done if requested.  Please have the pharmacy call with any other refills you may need.  Please continue your efforts at being more active, low cholesterol diet, and weight control.  Please keep your appointments with your specialists as you may have planned  You will be contacted regarding the referral for: Dr Christella Noa  Please return in 6 months, or sooner if needed, with Lab testing done 3-5 days before

## 2016-07-07 DIAGNOSIS — R739 Hyperglycemia, unspecified: Secondary | ICD-10-CM | POA: Insufficient documentation

## 2016-07-07 NOTE — Assessment & Plan Note (Signed)
stable overall by history and exam, recent data reviewed with pt, and pt to continue medical treatment as before,  to f/u any worsening symptoms or concerns Lab Results  Component Value Date   HGBA1C 6.0 01/24/2016

## 2016-07-07 NOTE — Assessment & Plan Note (Signed)
Recent worsening now with neuro change, for LS spine MRI, and refer back to NS - Dr Christella Noa

## 2016-07-07 NOTE — Assessment & Plan Note (Signed)
Mild elev today likely reactive, o/w stable overall by history and exam, recent data reviewed with pt, and pt to continue medical treatment as before,  to f/u any worsening symptoms or concerns BP Readings from Last 3 Encounters:  07/02/16 (!) 142/92  05/02/16 130/80  02/03/16 139/82

## 2016-07-25 ENCOUNTER — Other Ambulatory Visit: Payer: Self-pay | Admitting: Neurosurgery

## 2016-07-31 NOTE — Pre-Procedure Instructions (Signed)
    [facility]  07/31/2016      [place], [place] [address] Phone: [phone] Fax: [phone]  [facility] [place], [place] [address] [place] [address] Phone: [phone] Fax: [phone]    Your procedure is scheduled on Friday, April 6th   Report to [facility] Admitting at 7:15 AM             (posted surgery time 9:15 - 11:15 am)   Call this number if you have problems the [facility] of surgery:  [phone], otherwise call [phone] Mon - Fri from 8-4:30    Remember:  Do not eat food or drink liquids after midnight Thursday.   Take these medicines the morning of surgery with A SIP OF WATER :  Tramadol.              4-5 days prior to surgery, STOP taking any Vitamins, Herbal Supplements, Anti-inflammatories.   Do not wear jewelry - no rings or watches.  Do not wear lotions, cologne or deoderant.              Men may shave face and neck.  Do not bring valuables to the hospital.  [facility] is not responsible for any belongings or valuables.  Contacts, dentures or bridgework may not be worn into surgery.  Leave your suitcase in the car.  After surgery it may be brought to your room.  For patients admitted to the hospital, discharge time will be determined by your treatment team.  Please read over the following fact sheets that you were given. Pain Booklet, MRSA Information and Surgical Site Infection Prevention

## 2016-08-01 ENCOUNTER — Encounter (HOSPITAL_COMMUNITY): Payer: Self-pay

## 2016-08-01 ENCOUNTER — Encounter (HOSPITAL_COMMUNITY)
Admission: RE | Admit: 2016-08-01 | Discharge: 2016-08-01 | Disposition: A | Discharge status: Home / Self Care | Payer: Medicare Other | Source: Ambulatory Visit | Attending: Neurosurgery | Admitting: Neurosurgery

## 2016-08-01 DIAGNOSIS — Z01812 Encounter for preprocedural laboratory examination: Secondary | ICD-10-CM | POA: Insufficient documentation

## 2016-08-01 HISTORY — DX: Cardiac murmur, unspecified: R01.1

## 2016-08-01 HISTORY — DX: Gastro-esophageal reflux disease without esophagitis: K21.9

## 2016-08-01 HISTORY — DX: Unspecified hearing loss, bilateral: H91.93

## 2016-08-01 HISTORY — DX: Unspecified cataract: H26.9

## 2016-08-01 LAB — COMPREHENSIVE METABOLIC PANEL
ALT: 23 U/L (ref 17–63)
AST: 18 U/L (ref 15–41)
Albumin: 4.1 g/dL (ref 3.5–5.0)
Alkaline Phosphatase: 44 U/L (ref 38–126)
Anion gap: 4 — ABNORMAL LOW (ref 5–15)
BUN: 15 mg/dL (ref 6–20)
CO2: 27 mmol/L (ref 22–32)
Calcium: 9.3 mg/dL (ref 8.9–10.3)
Chloride: 109 mmol/L (ref 101–111)
Creatinine, Ser: 0.9 mg/dL (ref 0.61–1.24)
GFR calc Af Amer: >60 mL/min (ref >60)
GFR calc non Af Amer: >60 mL/min (ref >60)
Glucose, Bld: 108 mg/dL — ABNORMAL HIGH (ref 65–99)
Potassium: 4.4 mmol/L (ref 3.5–5.1)
Sodium: 140 mmol/L (ref 135–145)
Total Bilirubin: 0.8 mg/dL (ref 0.3–1.2)
Total Protein: 6.3 g/dL — ABNORMAL LOW (ref 6.5–8.1)

## 2016-08-01 LAB — CBC
HCT: 43.5 % (ref 39.0–52.0)
Hemoglobin: 15 g/dL (ref 13.0–17.0)
MCH: 29.6 pg (ref 26.0–34.0)
MCHC: 34.5 g/dL (ref 30.0–36.0)
MCV: 86 fL (ref 78.0–100.0)
Platelets: 122 10*3/uL — ABNORMAL LOW (ref 150–400)
RBC: 5.06 MIL/uL (ref 4.22–5.81)
RDW: 13.3 % (ref 11.5–15.5)
WBC: 7.9 10*3/uL (ref 4.0–10.5)

## 2016-08-01 LAB — SURGICAL PCR SCREEN
MRSA, PCR: NEGATIVE
Staphylococcus aureus: NEGATIVE

## 2016-08-01 NOTE — Progress Notes (Signed)
PCP is Dr. [name]   LOV 06/2016 Cardio is Dr. Ottie Glazier  [facility] 04/2016   Patient had another stress test in 04/2016.  Is unsure why they did one, as he denies any cardiac issues.   EKG 04/2016 -- NSR Echo 07/2014  LV EF 65 -70 % They stopped his basa on March 31st. Still no PAT orders.

## 2016-08-01 NOTE — Progress Notes (Signed)
   08/01/16 1524  OBSTRUCTIVE SLEEP APNEA  Have you ever been diagnosed with sleep apnea through a sleep study? No  Do you snore loudly (loud enough to be heard through closed doors)?  1  Do you often feel tired, fatigued, or sleepy during the daytime (such as falling asleep during driving or talking to someone)? 1 (since he's been retired.Marland Kitchen)  Has anyone observed you stop breathing during your sleep? 0  Do you have, or are you being treated for high blood pressure? 1  BMI more than 35 kg/m2? 0  Age > 50 (1-yes) 1  Neck circumference greater than:Male 16 inches or larger, Male 17inches or larger? 0  Male Gender (Yes=1) 1  Obstructive Sleep Apnea Score 5  Score 5 or greater  Results sent to PCP

## 2016-08-02 ENCOUNTER — Ambulatory Visit (HOSPITAL_COMMUNITY): Payer: Medicare Other

## 2016-08-02 ENCOUNTER — Ambulatory Visit (HOSPITAL_COMMUNITY): Payer: Medicare Other | Admitting: Certified Registered Nurse Anesthetist

## 2016-08-02 ENCOUNTER — Inpatient Hospital Stay (HOSPITAL_COMMUNITY)
Admission: AD | Admit: 2016-08-02 | Discharge: 2016-08-12 | DRG: 520 | Disposition: A | Discharge status: Skilled Nursing Facility | Payer: Medicare Other | Source: Ambulatory Visit | Attending: Neurosurgery | Admitting: Neurosurgery

## 2016-08-02 ENCOUNTER — Encounter (HOSPITAL_COMMUNITY): Payer: Self-pay | Admitting: Certified Registered Nurse Anesthetist

## 2016-08-02 ENCOUNTER — Encounter (HOSPITAL_COMMUNITY)
Admission: AD | Disposition: A | Discharge status: Skilled Nursing Facility | Payer: Self-pay | Source: Ambulatory Visit | Attending: Neurosurgery

## 2016-08-02 DIAGNOSIS — I252 Old myocardial infarction: Secondary | ICD-10-CM

## 2016-08-02 DIAGNOSIS — R5381 Other malaise: Secondary | ICD-10-CM | POA: Diagnosis not present

## 2016-08-02 DIAGNOSIS — M5126 Other intervertebral disc displacement, lumbar region: Secondary | ICD-10-CM | POA: Diagnosis present

## 2016-08-02 DIAGNOSIS — Z8249 Family history of ischemic heart disease and other diseases of the circulatory system: Secondary | ICD-10-CM

## 2016-08-02 DIAGNOSIS — Z9861 Coronary angioplasty status: Secondary | ICD-10-CM

## 2016-08-02 DIAGNOSIS — Z951 Presence of aortocoronary bypass graft: Secondary | ICD-10-CM

## 2016-08-02 DIAGNOSIS — I10 Essential (primary) hypertension: Secondary | ICD-10-CM | POA: Diagnosis present

## 2016-08-02 DIAGNOSIS — Z87891 Personal history of nicotine dependence: Secondary | ICD-10-CM

## 2016-08-02 DIAGNOSIS — Z91041 Radiographic dye allergy status: Secondary | ICD-10-CM

## 2016-08-02 DIAGNOSIS — Z85820 Personal history of malignant melanoma of skin: Secondary | ICD-10-CM | POA: Diagnosis not present

## 2016-08-02 DIAGNOSIS — I2510 Atherosclerotic heart disease of native coronary artery without angina pectoris: Secondary | ICD-10-CM | POA: Diagnosis present

## 2016-08-02 DIAGNOSIS — Z881 Allergy status to other antibiotic agents status: Secondary | ICD-10-CM | POA: Diagnosis not present

## 2016-08-02 DIAGNOSIS — M5116 Intervertebral disc disorders with radiculopathy, lumbar region: Secondary | ICD-10-CM | POA: Diagnosis present

## 2016-08-02 DIAGNOSIS — Z9841 Cataract extraction status, right eye: Secondary | ICD-10-CM

## 2016-08-02 DIAGNOSIS — M545 Low back pain: Secondary | ICD-10-CM | POA: Diagnosis present

## 2016-08-02 DIAGNOSIS — K219 Gastro-esophageal reflux disease without esophagitis: Secondary | ICD-10-CM | POA: Diagnosis present

## 2016-08-02 DIAGNOSIS — Z79899 Other long term (current) drug therapy: Secondary | ICD-10-CM | POA: Diagnosis not present

## 2016-08-02 DIAGNOSIS — F341 Dysthymic disorder: Secondary | ICD-10-CM | POA: Diagnosis present

## 2016-08-02 DIAGNOSIS — H9193 Unspecified hearing loss, bilateral: Secondary | ICD-10-CM | POA: Diagnosis present

## 2016-08-02 DIAGNOSIS — Z9842 Cataract extraction status, left eye: Secondary | ICD-10-CM

## 2016-08-02 DIAGNOSIS — Z419 Encounter for procedure for purposes other than remedying health state, unspecified: Secondary | ICD-10-CM

## 2016-08-02 DIAGNOSIS — Z7982 Long term (current) use of aspirin: Secondary | ICD-10-CM | POA: Diagnosis not present

## 2016-08-02 HISTORY — PX: LUMBAR LAMINECTOMY/DECOMPRESSION MICRODISCECTOMY: SHX5026

## 2016-08-02 SURGERY — LUMBAR LAMINECTOMY/DECOMPRESSION MICRODISCECTOMY 1 LEVEL
Anesthesia: General | Laterality: Right

## 2016-08-02 MED ORDER — MENTHOL 3 MG MT LOZG: 1.0000 | LOZENGE | OROMUCOSAL | Status: DC | PRN

## 2016-08-02 MED ORDER — ASPIRIN EC 81 MG PO TBEC
81.0000 mg | DELAYED_RELEASE_TABLET | Freq: Every day | ORAL | Status: DC
  Administered 2016-08-02 – 2016-08-11 (×10): 81 mg via ORAL
  Filled 2016-08-02 (×10): qty 1

## 2016-08-02 MED ORDER — BACITRACIN ZINC 500 UNIT/GM EX OINT
TOPICAL_OINTMENT | CUTANEOUS | Status: AC
  Filled 2016-08-02: qty 28.35

## 2016-08-02 MED ORDER — SODIUM CHLORIDE 0.9 % IV SOLN: 250.0000 mL | INTRAVENOUS | Status: DC

## 2016-08-02 MED ORDER — SERTRALINE HCL 50 MG PO TABS
50.0000 mg | ORAL_TABLET | Freq: Every evening | ORAL | Status: DC
  Administered 2016-08-02 – 2016-08-11 (×9): 50 mg via ORAL
  Filled 2016-08-02 (×9): qty 1

## 2016-08-02 MED ORDER — ONDANSETRON HCL 4 MG/2ML IJ SOLN: 4.0000 mg | Freq: Four times a day (QID) | INTRAMUSCULAR | Status: DC | PRN

## 2016-08-02 MED ORDER — PROMETHAZINE HCL 25 MG/ML IJ SOLN: 6.2500 mg | INTRAMUSCULAR | Status: DC | PRN

## 2016-08-02 MED ORDER — ONDANSETRON HCL 4 MG/2ML IJ SOLN
INTRAMUSCULAR | Status: AC
  Filled 2016-08-02: qty 2

## 2016-08-02 MED ORDER — SUCCINYLCHOLINE CHLORIDE 200 MG/10ML IV SOSY
PREFILLED_SYRINGE | INTRAVENOUS | Status: AC
  Filled 2016-08-02: qty 10

## 2016-08-02 MED ORDER — CARVEDILOL 3.125 MG PO TABS
3.1250 mg | ORAL_TABLET | Freq: Every day | ORAL | Status: DC
  Administered 2016-08-02 – 2016-08-11 (×10): 3.125 mg via ORAL
  Filled 2016-08-02 (×10): qty 1

## 2016-08-02 MED ORDER — SENNOSIDES-DOCUSATE SODIUM 8.6-50 MG PO TABS: 1.0000 | ORAL_TABLET | Freq: Every evening | ORAL | Status: DC | PRN

## 2016-08-02 MED ORDER — BISACODYL 5 MG PO TBEC
5.0000 mg | DELAYED_RELEASE_TABLET | Freq: Every day | ORAL | Status: DC | PRN
  Filled 2016-08-02: qty 1

## 2016-08-02 MED ORDER — HYDROMORPHONE HCL 1 MG/ML IJ SOLN
0.2500 mg | INTRAMUSCULAR | Status: DC | PRN
  Administered 2016-08-02 (×2): 0.5 mg via INTRAVENOUS

## 2016-08-02 MED ORDER — LIDOCAINE 2% (20 MG/ML) 5 ML SYRINGE
INTRAMUSCULAR | Status: DC | PRN
  Administered 2016-08-02: 80 mg via INTRAVENOUS

## 2016-08-02 MED ORDER — SUCCINYLCHOLINE CHLORIDE 200 MG/10ML IV SOSY
PREFILLED_SYRINGE | INTRAVENOUS | Status: DC | PRN
  Administered 2016-08-02: 100 mg via INTRAVENOUS

## 2016-08-02 MED ORDER — FENTANYL CITRATE (PF) 250 MCG/5ML IJ SOLN
INTRAMUSCULAR | Status: AC
  Filled 2016-08-02: qty 5

## 2016-08-02 MED ORDER — LACTATED RINGERS IV SOLN
INTRAVENOUS | Status: DC | PRN
  Administered 2016-08-02 (×2): via INTRAVENOUS

## 2016-08-02 MED ORDER — OXYCODONE HCL 5 MG PO TABS
ORAL_TABLET | ORAL | Status: AC
  Administered 2016-08-02: 10 mg via ORAL
  Filled 2016-08-02: qty 2

## 2016-08-02 MED ORDER — ROCURONIUM BROMIDE 10 MG/ML (PF) SYRINGE
PREFILLED_SYRINGE | INTRAVENOUS | Status: DC | PRN
  Administered 2016-08-02: 30 mg via INTRAVENOUS
  Administered 2016-08-02: 10 mg via INTRAVENOUS

## 2016-08-02 MED ORDER — NORTRIPTYLINE HCL 25 MG PO CAPS
25.0000 mg | ORAL_CAPSULE | Freq: Every day | ORAL | Status: DC
  Administered 2016-08-02 – 2016-08-11 (×10): 25 mg via ORAL
  Filled 2016-08-02 (×12): qty 1

## 2016-08-02 MED ORDER — PHENYLEPHRINE HCL 10 MG/ML IJ SOLN
INTRAVENOUS | Status: DC | PRN
  Administered 2016-08-02: 20 ug/min via INTRAVENOUS

## 2016-08-02 MED ORDER — CHLORHEXIDINE GLUCONATE CLOTH 2 % EX PADS: 6.0000 | MEDICATED_PAD | Freq: Once | CUTANEOUS | Status: DC

## 2016-08-02 MED ORDER — HYDROMORPHONE HCL 1 MG/ML IJ SOLN
INTRAMUSCULAR | Status: AC
  Filled 2016-08-02: qty 0.5

## 2016-08-02 MED ORDER — CEFAZOLIN SODIUM-DEXTROSE 2-4 GM/100ML-% IV SOLN
2.0000 g | INTRAVENOUS | Status: AC
  Administered 2016-08-02: 2 g via INTRAVENOUS
  Filled 2016-08-02: qty 100

## 2016-08-02 MED ORDER — ACETAMINOPHEN 325 MG PO TABS
650.0000 mg | ORAL_TABLET | ORAL | Status: DC | PRN
  Administered 2016-08-03 – 2016-08-12 (×10): 650 mg via ORAL
  Filled 2016-08-02 (×12): qty 2

## 2016-08-02 MED ORDER — SODIUM CHLORIDE 0.9% FLUSH: 3.0000 mL | INTRAVENOUS | Status: DC | PRN

## 2016-08-02 MED ORDER — PANTOPRAZOLE SODIUM 40 MG PO TBEC
40.0000 mg | DELAYED_RELEASE_TABLET | Freq: Every day | ORAL | Status: DC
  Administered 2016-08-02 – 2016-08-12 (×11): 40 mg via ORAL
  Filled 2016-08-02 (×12): qty 1

## 2016-08-02 MED ORDER — MAGNESIUM CITRATE PO SOLN: 1.0000 | Freq: Once | ORAL | Status: DC | PRN

## 2016-08-02 MED ORDER — THROMBIN 5000 UNITS EX SOLR
CUTANEOUS | Status: DC | PRN
  Administered 2016-08-02 (×2): 5000 [IU] via TOPICAL

## 2016-08-02 MED ORDER — LIDOCAINE 2% (20 MG/ML) 5 ML SYRINGE
INTRAMUSCULAR | Status: AC
  Filled 2016-08-02: qty 5

## 2016-08-02 MED ORDER — FENTANYL CITRATE (PF) 100 MCG/2ML IJ SOLN
INTRAMUSCULAR | Status: DC | PRN
  Administered 2016-08-02: 25 ug via INTRAVENOUS
  Administered 2016-08-02: 100 ug via INTRAVENOUS
  Administered 2016-08-02: 25 ug via INTRAVENOUS

## 2016-08-02 MED ORDER — LIDOCAINE-EPINEPHRINE (PF) 2 %-1:200000 IJ SOLN
INTRAMUSCULAR | Status: AC
  Filled 2016-08-02: qty 20

## 2016-08-02 MED ORDER — ZOLPIDEM TARTRATE 5 MG PO TABS
5.0000 mg | ORAL_TABLET | Freq: Every evening | ORAL | Status: DC | PRN
  Administered 2016-08-02: 5 mg via ORAL
  Filled 2016-08-02: qty 1

## 2016-08-02 MED ORDER — KETOROLAC TROMETHAMINE 15 MG/ML IJ SOLN
7.5000 mg | Freq: Four times a day (QID) | INTRAMUSCULAR | Status: AC
  Administered 2016-08-02 – 2016-08-03 (×4): 7.5 mg via INTRAVENOUS
  Filled 2016-08-02 (×3): qty 1

## 2016-08-02 MED ORDER — NITROGLYCERIN 0.4 MG SL SUBL
0.4000 mg | SUBLINGUAL_TABLET | SUBLINGUAL | Status: DC | PRN
Start: 2016-08-02 — End: 2016-08-13

## 2016-08-02 MED ORDER — DIAZEPAM 5 MG PO TABS
5.0000 mg | ORAL_TABLET | Freq: Four times a day (QID) | ORAL | Status: DC | PRN
  Administered 2016-08-03 – 2016-08-08 (×6): 5 mg via ORAL
  Filled 2016-08-02 (×6): qty 1

## 2016-08-02 MED ORDER — SUGAMMADEX SODIUM 200 MG/2ML IV SOLN
INTRAVENOUS | Status: AC
  Filled 2016-08-02: qty 2

## 2016-08-02 MED ORDER — PHENYLEPHRINE 40 MCG/ML (10ML) SYRINGE FOR IV PUSH (FOR BLOOD PRESSURE SUPPORT)
PREFILLED_SYRINGE | INTRAVENOUS | Status: DC | PRN
  Administered 2016-08-02: 80 ug via INTRAVENOUS
  Administered 2016-08-02: 40 ug via INTRAVENOUS
  Administered 2016-08-02: 120 ug via INTRAVENOUS
  Administered 2016-08-02 (×2): 80 ug via INTRAVENOUS

## 2016-08-02 MED ORDER — PROPOFOL 10 MG/ML IV BOLUS
INTRAVENOUS | Status: AC
  Filled 2016-08-02: qty 20

## 2016-08-02 MED ORDER — PROPOFOL 10 MG/ML IV BOLUS
INTRAVENOUS | Status: DC | PRN
  Administered 2016-08-02: 120 mg via INTRAVENOUS

## 2016-08-02 MED ORDER — HEMOSTATIC AGENTS (NO CHARGE) OPTIME
TOPICAL | Status: DC | PRN
  Administered 2016-08-02: 1 via TOPICAL

## 2016-08-02 MED ORDER — ACETAMINOPHEN 650 MG RE SUPP: 650.0000 mg | RECTAL | Status: DC | PRN

## 2016-08-02 MED ORDER — SENNA 8.6 MG PO TABS
1.0000 | ORAL_TABLET | Freq: Two times a day (BID) | ORAL | Status: DC
Start: 2016-08-02 — End: 2016-08-13
  Administered 2016-08-02 – 2016-08-12 (×19): 8.6 mg via ORAL
  Filled 2016-08-02 (×21): qty 1

## 2016-08-02 MED ORDER — HYDROMORPHONE HCL 1 MG/ML IJ SOLN
INTRAMUSCULAR | Status: AC
  Administered 2016-08-02: 0.5 mg via INTRAVENOUS
  Filled 2016-08-02: qty 0.5

## 2016-08-02 MED ORDER — PHENYLEPHRINE 40 MCG/ML (10ML) SYRINGE FOR IV PUSH (FOR BLOOD PRESSURE SUPPORT)
PREFILLED_SYRINGE | INTRAVENOUS | Status: AC
  Filled 2016-08-02: qty 10

## 2016-08-02 MED ORDER — PHENOL 1.4 % MT LIQD: 1.0000 | OROMUCOSAL | Status: DC | PRN

## 2016-08-02 MED ORDER — LACTATED RINGERS IV SOLN
INTRAVENOUS | Status: DC
  Administered 2016-08-02: 08:00:00 via INTRAVENOUS

## 2016-08-02 MED ORDER — EPHEDRINE SULFATE-NACL 50-0.9 MG/10ML-% IV SOSY
PREFILLED_SYRINGE | INTRAVENOUS | Status: DC | PRN
  Administered 2016-08-02 (×3): 10 mg via INTRAVENOUS

## 2016-08-02 MED ORDER — ROSUVASTATIN CALCIUM 5 MG PO TABS
10.0000 mg | ORAL_TABLET | Freq: Every day | ORAL | Status: DC
  Administered 2016-08-02 – 2016-08-11 (×10): 10 mg via ORAL
  Filled 2016-08-02: qty 1
  Filled 2016-08-02: qty 2
  Filled 2016-08-02 (×2): qty 1
  Filled 2016-08-02 (×6): qty 2
  Filled 2016-08-02 (×2): qty 1
  Filled 2016-08-02 (×3): qty 2

## 2016-08-02 MED ORDER — ALBUTEROL SULFATE (2.5 MG/3ML) 0.083% IN NEBU: 2.5000 mg | INHALATION_SOLUTION | Freq: Four times a day (QID) | RESPIRATORY_TRACT | Status: DC | PRN

## 2016-08-02 MED ORDER — EPHEDRINE 5 MG/ML INJ
INTRAVENOUS | Status: AC
  Filled 2016-08-02: qty 10

## 2016-08-02 MED ORDER — FINASTERIDE 5 MG PO TABS
5.0000 mg | ORAL_TABLET | Freq: Every day | ORAL | Status: DC
  Administered 2016-08-02 – 2016-08-11 (×10): 5 mg via ORAL
  Filled 2016-08-02 (×10): qty 1

## 2016-08-02 MED ORDER — SUGAMMADEX SODIUM 200 MG/2ML IV SOLN
INTRAVENOUS | Status: DC | PRN
  Administered 2016-08-02: 200 mg via INTRAVENOUS

## 2016-08-02 MED ORDER — BACITRACIN ZINC 500 UNIT/GM EX OINT
TOPICAL_OINTMENT | CUTANEOUS | Status: DC | PRN
  Administered 2016-08-02: 1 via TOPICAL

## 2016-08-02 MED ORDER — ONDANSETRON HCL 4 MG/2ML IJ SOLN
INTRAMUSCULAR | Status: DC | PRN
  Administered 2016-08-02: 4 mg via INTRAVENOUS

## 2016-08-02 MED ORDER — LIDOCAINE-EPINEPHRINE (PF) 2 %-1:200000 IJ SOLN
INTRAMUSCULAR | Status: DC | PRN
  Administered 2016-08-02: 10 mL via INTRADERMAL

## 2016-08-02 MED ORDER — POTASSIUM CHLORIDE IN NACL 20-0.9 MEQ/L-% IV SOLN
INTRAVENOUS | Status: DC
  Administered 2016-08-02 – 2016-08-07 (×9): via INTRAVENOUS
  Filled 2016-08-02 (×14): qty 1000

## 2016-08-02 MED ORDER — TAMSULOSIN HCL 0.4 MG PO CAPS
0.4000 mg | ORAL_CAPSULE | Freq: Every day | ORAL | Status: DC
  Administered 2016-08-02 – 2016-08-11 (×10): 0.4 mg via ORAL
  Filled 2016-08-02 (×10): qty 1

## 2016-08-02 MED ORDER — THROMBIN 5000 UNITS EX SOLR
CUTANEOUS | Status: AC
  Filled 2016-08-02: qty 10000

## 2016-08-02 MED ORDER — OXYCODONE HCL 5 MG PO TABS
5.0000 mg | ORAL_TABLET | ORAL | Status: DC | PRN
  Administered 2016-08-02 – 2016-08-03 (×4): 10 mg via ORAL
  Administered 2016-08-04: 5 mg via ORAL
  Administered 2016-08-04 (×2): 10 mg via ORAL
  Administered 2016-08-05: 5 mg via ORAL
  Administered 2016-08-05 – 2016-08-06 (×2): 10 mg via ORAL
  Administered 2016-08-06 – 2016-08-07 (×2): 5 mg via ORAL
  Filled 2016-08-02: qty 2
  Filled 2016-08-02 (×2): qty 1
  Filled 2016-08-02: qty 2
  Filled 2016-08-02 (×2): qty 1
  Filled 2016-08-02 (×2): qty 2
  Filled 2016-08-02: qty 1
  Filled 2016-08-02 (×3): qty 2

## 2016-08-02 MED ORDER — SODIUM CHLORIDE 0.9% FLUSH
3.0000 mL | Freq: Two times a day (BID) | INTRAVENOUS | Status: DC
  Administered 2016-08-03 – 2016-08-10 (×5): 3 mL via INTRAVENOUS

## 2016-08-02 MED ORDER — KETOROLAC TROMETHAMINE 15 MG/ML IJ SOLN
INTRAMUSCULAR | Status: AC
  Administered 2016-08-02: 7.5 mg via INTRAVENOUS
  Filled 2016-08-02: qty 1

## 2016-08-02 MED ORDER — MORPHINE SULFATE (PF) 2 MG/ML IV SOLN
2.0000 mg | INTRAVENOUS | Status: DC | PRN
  Administered 2016-08-03 (×3): 2 mg via INTRAVENOUS
  Filled 2016-08-02 (×4): qty 1

## 2016-08-02 MED ORDER — ONDANSETRON HCL 4 MG PO TABS: 4.0000 mg | ORAL_TABLET | Freq: Four times a day (QID) | ORAL | Status: DC | PRN

## 2016-08-02 SURGICAL SUPPLY — 56 items
BAG DECANTER FOR FLEXI CONT (MISCELLANEOUS) ×2 IMPLANT
BENZOIN TINCTURE PRP APPL 2/3 (GAUZE/BANDAGES/DRESSINGS) IMPLANT
BLADE CLIPPER SURG (BLADE) IMPLANT
BUR MATCHSTICK NEURO 3.0 LAGG (BURR) ×2 IMPLANT
CANISTER SUCT 3000ML PPV (MISCELLANEOUS) ×2 IMPLANT
CARTRIDGE OIL MAESTRO DRILL (MISCELLANEOUS) ×1 IMPLANT
DECANTER SPIKE VIAL GLASS SM (MISCELLANEOUS) ×2 IMPLANT
DERMABOND ADVANCED (GAUZE/BANDAGES/DRESSINGS) ×1
DERMABOND ADVANCED .7 DNX12 (GAUZE/BANDAGES/DRESSINGS) ×1 IMPLANT
DIFFUSER DRILL AIR PNEUMATIC (MISCELLANEOUS) ×2 IMPLANT
DRAPE LAPAROTOMY 100X72X124 (DRAPES) ×2 IMPLANT
DRAPE MICROSCOPE LEICA (MISCELLANEOUS) ×2 IMPLANT
DRAPE POUCH INSTRU U-SHP 10X18 (DRAPES) ×2 IMPLANT
DRAPE SURG 17X23 STRL (DRAPES) ×2 IMPLANT
DRSG OPSITE POSTOP 4X6 (GAUZE/BANDAGES/DRESSINGS) ×2 IMPLANT
DURAPREP 26ML APPLICATOR (WOUND CARE) ×2 IMPLANT
ELECT REM PT RETURN 9FT ADLT (ELECTROSURGICAL) ×2
ELECTRODE REM PT RTRN 9FT ADLT (ELECTROSURGICAL) ×1 IMPLANT
GAUZE SPONGE 4X4 12PLY STRL (GAUZE/BANDAGES/DRESSINGS) IMPLANT
GAUZE SPONGE 4X4 16PLY XRAY LF (GAUZE/BANDAGES/DRESSINGS) IMPLANT
GLOVE ECLIPSE 6.5 STRL STRAW (GLOVE) ×2 IMPLANT
GLOVE ECLIPSE 7.5 STRL STRAW (GLOVE) ×6 IMPLANT
GLOVE EXAM NITRILE LRG STRL (GLOVE) IMPLANT
GLOVE EXAM NITRILE XL STR (GLOVE) IMPLANT
GLOVE EXAM NITRILE XS STR PU (GLOVE) IMPLANT
GLOVE INDICATOR 7.5 STRL GRN (GLOVE) ×2 IMPLANT
GLOVE INDICATOR 8.0 STRL GRN (GLOVE) ×2 IMPLANT
GLOVE OPTIFIT SS 7.5 STRL LX (GLOVE) ×2 IMPLANT
GLOVE OPTIFIT SS 8.0 STRL (GLOVE) ×2 IMPLANT
GOWN STRL REUS W/ TWL LRG LVL3 (GOWN DISPOSABLE) ×2 IMPLANT
GOWN STRL REUS W/ TWL XL LVL3 (GOWN DISPOSABLE) IMPLANT
GOWN STRL REUS W/TWL 2XL LVL3 (GOWN DISPOSABLE) ×2 IMPLANT
GOWN STRL REUS W/TWL LRG LVL3 (GOWN DISPOSABLE) ×2
GOWN STRL REUS W/TWL XL LVL3 (GOWN DISPOSABLE)
GRAFT DURAGEN MATRIX 1WX1L (Tissue) ×2 IMPLANT
KIT BASIN OR (CUSTOM PROCEDURE TRAY) ×2 IMPLANT
KIT ROOM TURNOVER OR (KITS) ×2 IMPLANT
NEEDLE HYPO 25X1 1.5 SAFETY (NEEDLE) ×4 IMPLANT
NEEDLE SPNL 18GX3.5 QUINCKE PK (NEEDLE) ×2 IMPLANT
NS IRRIG 1000ML POUR BTL (IV SOLUTION) ×2 IMPLANT
OIL CARTRIDGE MAESTRO DRILL (MISCELLANEOUS) ×2
PACK LAMINECTOMY NEURO (CUSTOM PROCEDURE TRAY) ×2 IMPLANT
PAD ARMBOARD 7.5X6 YLW CONV (MISCELLANEOUS) ×6 IMPLANT
RUBBERBAND STERILE (MISCELLANEOUS) ×4 IMPLANT
SEALANT ADHERUS EXTEND TIP (MISCELLANEOUS) ×2 IMPLANT
SPONGE LAP 4X18 X RAY DECT (DISPOSABLE) IMPLANT
SPONGE SURGIFOAM ABS GEL SZ50 (HEMOSTASIS) ×2 IMPLANT
STRIP CLOSURE SKIN 1/2X4 (GAUZE/BANDAGES/DRESSINGS) IMPLANT
SUT ETHILON 3 0 FSL (SUTURE) ×4 IMPLANT
SUT VIC AB 0 CT1 18XCR BRD8 (SUTURE) ×2 IMPLANT
SUT VIC AB 0 CT1 8-18 (SUTURE) ×2
SUT VIC AB 2-0 CT1 18 (SUTURE) ×4 IMPLANT
SUT VIC AB 3-0 SH 8-18 (SUTURE) ×4 IMPLANT
TOWEL GREEN STERILE (TOWEL DISPOSABLE) ×2 IMPLANT
TOWEL GREEN STERILE FF (TOWEL DISPOSABLE) ×2 IMPLANT
WATER STERILE IRR 1000ML POUR (IV SOLUTION) ×2 IMPLANT

## 2016-08-02 NOTE — Anesthesia Procedure Notes (Signed)
Procedure Name: Intubation Date/Time: 08/02/2016 9:33 AM Performed by: Garrison Columbus T Pre-anesthesia Checklist: Patient identified, Emergency Drugs available, Suction available and Patient being monitored Patient Re-evaluated:Patient Re-evaluated prior to inductionOxygen Delivery Method: Circle System Utilized Preoxygenation: Pre-oxygenation with 100% oxygen Intubation Type: IV induction Ventilation: Mask ventilation without difficulty Laryngoscope Size: Glidescope and 3 Grade View: Grade I Tube type: Oral Tube size: 7.5 mm Number of attempts: 1 Airway Equipment and Method: Stylet and Oral airway Placement Confirmation: ETT inserted through vocal cords under direct vision,  positive ETCO2 and breath sounds checked- equal and bilateral Secured at: 22 cm Tube secured with: Tape Dental Injury: Teeth and Oropharynx as per pre-operative assessment

## 2016-08-02 NOTE — Anesthesia Preprocedure Evaluation (Signed)
Anesthesia Evaluation  Patient identified by MRN, date of birth, ID band Patient awake    Reviewed: Allergy & Precautions, NPO status , Patient's Chart, lab work & pertinent test results, reviewed documented beta blocker date and time   Airway Mallampati: II       Dental no notable dental hx. (+) Teeth Intact   Pulmonary shortness of breath, former smoker,    Pulmonary exam normal breath sounds clear to auscultation       Cardiovascular hypertension, Pt. on medications and Pt. on home beta blockers + CAD, + Past MI and + DOE  Normal cardiovascular exam+ Valvular Problems/Murmurs  Rhythm:Regular Rate:Normal     Neuro/Psych  Headaches, PSYCHIATRIC DISORDERS Depression  Neuromuscular disease    GI/Hepatic Neg liver ROS, GERD  Medicated and Controlled,  Endo/Other  negative endocrine ROS  Renal/GU negative Renal ROS  negative genitourinary   Musculoskeletal  (+) Arthritis , Herniated disc L4-5   Abdominal (+) - obese,   Peds  Hematology  (+) anemia , Thrombocytopenia   Anesthesia Other Findings   Reproductive/Obstetrics                             Anesthesia Physical Anesthesia Plan  ASA: III  Anesthesia Plan: General   Post-op Pain Management:    Induction: Intravenous  Airway Management Planned: Oral ETT  Additional Equipment:   Intra-op Plan:   Post-operative Plan: Extubation in OR  Informed Consent: I have reviewed the patients History and Physical, chart, labs and discussed the procedure including the risks, benefits and alternatives for the proposed anesthesia with the patient or authorized representative who has indicated his/her understanding and acceptance.   Dental advisory given  Plan Discussed with: Anesthesiologist, CRNA and Surgeon  Anesthesia Plan Comments:         Anesthesia Quick Evaluation

## 2016-08-02 NOTE — Transfer of Care (Signed)
Immediate Anesthesia Transfer of Care Note  Patient: Troy Combs  Procedure(s) Performed: Procedure(s): MICRODISCECTOMY LUMBAR FOUR- LUMBAR FIVE RIGHT (Right)  Patient Location: PACU  Anesthesia Type:General  Level of Consciousness: awake and alert   Airway & Oxygen Therapy: Patient Spontanous Breathing and Patient connected to face mask oxygen  Post-op Assessment: Report given to RN, Post -op Vital signs reviewed and stable and Patient moving all extremities X 4  Post vital signs: Reviewed and stable  Last Vitals:  Vitals:   08/02/16 0739 08/02/16 0742  BP:  (!) 170/80  Pulse: 65   Resp: 20   Temp: 36.2 C     Last Pain:  Vitals:   08/02/16 0739  TempSrc: Oral      Patients Stated Pain Goal: 5 (74/82/70 7867)  Complications: No apparent anesthesia complications

## 2016-08-02 NOTE — Anesthesia Postprocedure Evaluation (Signed)
Anesthesia Post Note  Patient: Troy Combs  Procedure(s) Performed: Procedure(s) (LRB): MICRODISCECTOMY LUMBAR FOUR- LUMBAR FIVE RIGHT (Right)  Patient location during evaluation: PACU Anesthesia Type: General Level of consciousness: awake and alert and oriented Pain management: pain level controlled Vital Signs Assessment: post-procedure vital signs reviewed and stable Respiratory status: spontaneous breathing, nonlabored ventilation, respiratory function stable and patient connected to nasal cannula oxygen Cardiovascular status: blood pressure returned to baseline and stable Postop Assessment: no signs of nausea or vomiting Anesthetic complications: no       Last Vitals:  Vitals:   08/02/16 1145 08/02/16 1200  BP: 120/74 127/75  Pulse: (!) 59 60  Resp: 12 17  Temp:      Last Pain:  Vitals:   08/02/16 1145  TempSrc:   PainSc: 7                  Noe Pittsley A.

## 2016-08-02 NOTE — Op Note (Signed)
08/02/2016  4:51 PM  PATIENT:  Troy Combs  83 y.o. male with a recurrent herniated disc at L4/5 eccentric to the right. 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 was taken to the operating room, intubated and placed under a general anesthetic without difficulty. He was positioned prone on a Wilson frame with all pressure points padded. His back was prepped and draped in a sterile manner. I opened the skin with a 10 blade and carried the dissection down to the thoracolumbar fascia. I used both sharp dissection and the monopolar cautery to expose the lamina of L4, and L5. I confirmed my location with an intraoperative xray.  I used the drill, Kerrison punches, and curettes to perform a semihemilaminectomy of L4. I used the punches and curettes to remove the scar, exposing the thecal sac and disc space. I brought the microscope into the operative field and with Dr.Ditty's assistance we started our decompression of the spinal canal, thecal sac and L5 root(s). I cauterized epidural veins overlying the disc space then divided them sharply. I opened the disc space with a 15 blade and proceeded with the discectomy. I retracted the thecal sac once the I entered the disc space with a penfield 4 dissector. I used a D'errico nerve root retractor to retract the theca, and I saw spinal fluid. I continued with the operation and started the discetomy.  I used pituitary rongeurs, curettes, and other instruments to remove disc material. After the  discectomy was completed we inspected the L5 nerve root and felt it was well decompressed. I explored rostrally, laterally, medially, and caudally and was satisfied with the decompression.  I then placed duragen over the dural rent,as.  this was not able to be repaired primarily. The rent was anterior. I irrigated the wound, then closed in layers. I approximated the thoracolumbar fascia, subcutaneous, and subcuticular planes with vicryl sutures. I approximated the skin edges with a nylon suture. I used an occlusive bandage for a sterile dressing.   PLAN OF CARE: Admit to inpatient   PATIENT DISPOSITION:  PACU - hemodynamically stable.   Delay start of Pharmacological VTE agent (>24hrs) due to surgical blood loss or risk of bleeding:  yes

## 2016-08-02 NOTE — Progress Notes (Signed)
Called OR desk to get them to remind Troy Combs to put his orders in [facility]

## 2016-08-02 NOTE — H&P (Signed)
BP (!) 170/80   Pulse 65   Temp 97.1 F (36.2 C) (Oral)   Resp 20   Wt 78.5 kg (173 lb)   SpO2 98%   BMI 25.55 kg/m   [name] is a gentleman on whom I took out a disc herniation at L4-5 on the right side on October 17th. He had done well until last week, Tuesday, when he had the onset of pain in his lower back. It does not feel exactly like it did previously because he does not have the sharp pains in his lower extremities at all. He has full strength. No bowel or bladder dysfunction.  On exam, he has an antalgic gait. Reflexes are brisk, however, both at the knee and ankle. Proprioception is intact. He has 5/5 strength in the hip flexors, extensors, quadriceps, hamstrings, dorsiflexors and plantar flexors. Height 5 feet 10 inches, weight 172.4 pounds, blood pressure is 144/76, pulse is 87, temperature is 96.9. Pain is 3/10.  He was placed on his Prednisone taper this past Sunday. Again, most of the pain is in his lower back. He sometimes has a sensation of needles sticking into his toes. There was absolutely no antecedent trauma.  [name] returns with an MRI that shows a recurrent disc at L4-5 on the right side. He has opted to undergo operative decompression of the disc herniation.  Allergies  Allergen Reactions  . Iohexol Other (See Comments)     Code: HIVES, Desc: PATENT STATES HE IS ALLERGIC TO IV DYE 09/14/08/RM, Onset Date: [id number]   . Ciprofloxacin Hives and Rash    "Patient unsure" just know he has a reaction to med Medical record indicates allergic reaction  . Other     UNSPECIFIED REACTION  Angioplasty dye pt.>> Pt only recalls Had "very bad reaction"   Past Medical History:  Diagnosis Date  . Allergic rhinitis 09/20/2014  . Anemia, unspecified   . Arthritis   . Bladder neck obstruction   . Cataracts, bilateral   . Coronary atherosclerosis of unspecified type of vessel, native or graft   . Diminished hearing, bilateral   . Dysthymic disorder   . GERD  (gastroesophageal reflux disease)   . Heart murmur    as a child  . Impaired glucose tolerance 12/02/2012  . Impotence of organic origin   . Lumbar radiculopathy   . Malignant melanoma of skin of upper limb, including shoulder ([place])   . Old myocardial infarction   . Personal history of malignant melanoma of skin   . Personal history of other diseases of digestive system   . Thoracic or lumbosacral neuritis or radiculitis, unspecified   . Unspecified essential hypertension    Past Surgical History:  Procedure Laterality Date  . ARTHRODESIS  07/06/2002   of left long finger distal interphalangeal joint with Kirschner wire fixation X 3  . ATRIAL ABLATION SURGERY  05/04/2001   Dr. [name]  . CARDIAC CATHETERIZATION  09/05/2008   Revealing 4 of 4 patent grafts with native multivessel coronary artery disease, EF  of 60% without regional wall motion abnormalities.  Marland Kitchen CATARACT EXTRACTION, BILATERAL    . CHOLECYSTECTOMY    . COLONOSCOPY    . CORONARY ANGIOPLASTY  1993  . CORONARY ARTERY BYPASS GRAFT  10/17/2000   [name]. [name], [place]     AFTER LIVER TRAUMA AND HAND SURGERY  . EYE SURGERY    . HERNIA REPAIR     BILATERAL  . LUMBAR LAMINECTOMY/DECOMPRESSION MICRODISCECTOMY N/A  02/02/2016   Procedure: MICRODISCECTOMY LUMBAR FOUR- LUMBAR FIVE;  Surgeon: Ashok Pall, MD;  Location: [facility];  Service: Neurosurgery;  Laterality: N/A;  MICRODISCECTOMY L4-L5  . punctured eardrum      to relieve blood build up   for ear infection  . TONSILLECTOMY     Family History  Problem Relation Age of Onset  . Heart disease Father   . Ataxia Neg Hx   . Chorea Neg Hx   . Dementia Neg Hx   . Mental retardation Neg Hx   . Migraines Neg Hx   . Multiple sclerosis Neg Hx   . Neurofibromatosis Neg Hx   . Neuropathy Neg Hx   . Parkinsonism Neg Hx   . Seizures Neg Hx   . Stroke Neg Hx    Social History   Social History  . Marital status: Married    Spouse name: N/A  .  Number of children: 5  . Years of education: N/A   Occupational History  . Not on file.   Social History Main Topics  . Smoking status: Former Smoker    Quit date: 09/21/1955  . Smokeless tobacco: Never Used  . Alcohol use 0.0 oz/week     Comment: one or two ounces a week  . Drug use: No  . Sexual activity: Not on file   Other Topics Concern  . Not on file   Social History Narrative   Lives with wife in a one story home.  Has 5 children.  Retired Freight forwarder for [employer].  Education: 2 years of college.    Prior to Admission medications   Medication Sig Start Date End Date Taking? Authorizing Provider  albuterol (PROVENTIL HFA;VENTOLIN HFA) 108 (90 BASE) MCG/ACT inhaler Inhale 2 puffs into the lungs every 6 (six) hours as needed for wheezing or shortness of breath. 08/10/14  Yes Biagio Borg, MD  aspirin EC 81 MG tablet Take 81 mg by mouth at bedtime.    Yes Historical Provider, MD  carvedilol (COREG) 3.125 MG tablet Take 1 tablet (3.125 mg total) by mouth at bedtime. 04/15/16  Yes Dorothy Spark, MD  finasteride (PROSCAR) 5 MG tablet Take 5 mg by mouth at bedtime.    Yes Historical Provider, MD  nitroGLYCERIN (NITROSTAT) 0.4 MG SL tablet Place 1 tablet (0.4 mg total) under the tongue every 5 (five) minutes as needed for chest pain (x 3 doses). Reported on 06/15/2015 11/09/15  Yes Dorothy Spark, MD  nortriptyline (PAMELOR) 25 MG capsule Take 1 capsule (25 mg total) by mouth at bedtime. 04/15/16  Yes Biagio Borg, MD  omeprazole (PRILOSEC) 20 MG capsule Take 20 mg by mouth at bedtime.   Yes Historical Provider, MD  rosuvastatin (CRESTOR) 10 MG tablet Take 10 mg by mouth at bedtime.  06/11/16  Yes Historical Provider, MD  sertraline (ZOLOFT) 50 MG tablet TAKE 1 TABLET DAILY Patient taking differently: TAKE 1 TABLET DAILY IN THE EVENING 01/24/16  Yes Biagio Borg, MD  tamsulosin (FLOMAX) 0.4 MG CAPS capsule TAKE 1 CAPSULE DAILY Patient taking differently: TAKE 1 CAPSULE AT BEDTIME. 03/13/16   Yes Biagio Borg, MD  traMADol (ULTRAM) 50 MG tablet Take 50 mg by mouth every 6 (six) hours as needed for pain. 07/15/16  Yes Historical Provider, MD

## 2016-08-03 NOTE — Progress Notes (Signed)
Vitals:   08/02/16 1805 08/02/16 2139 08/03/16 0058 08/03/16 0515  BP: 132/65 140/70 (!) 168/84 (!) 161/77  Pulse: 70 71 78 79  Resp: 18 18 18 18   Temp: 98 F (36.7 C) 98 F (36.7 C) 99.5 F (37.5 C) 99.9 F (37.7 C)  TempSrc: Oral Oral Oral Oral  SpO2: 96% 95% 95% 92%  Weight:      Height:        CBC  Recent Labs  08/01/16 1547  WBC 7.9  HGB 15.0  HCT 43.5  PLT 122*   BMET  Recent Labs  08/01/16 1547  NA 140  K 4.4  CL 109  CO2 27  GLUCOSE 108*  BUN 15  CREATININE 0.90  CALCIUM 9.3    Resting in bed, continues on bedrest, head of bed flat. Dressing dry and intact. Mild back discomfort.  Plan: Plan to continue on bedrest for 5 days.  Hosie Spangle, MD 08/03/2016, 8:35 AM

## 2016-08-03 NOTE — Plan of Care (Signed)
Problem: Activity: Goal: Ability to avoid complications of mobility impairment will improve Outcome: Not Applicable Date Met: 71/21/97 Pt is bedrest x5 days post op.

## 2016-08-04 NOTE — Progress Notes (Signed)
Pt seen and examined.  No issues overnight. Still with head of bed flat. Mild back discomfort.  No new or worsening of sx  EXAM: Temp:  [97.7 F (36.5 C)-99.5 F (37.5 C)] 98.3 F (36.8 C) (04/08 0918) Pulse Rate:  [77-86] 86 (04/08 0918) Resp:  [16-18] 16 (04/08 0918) BP: (110-178)/(57-89) 178/88 (04/08 0918) SpO2:  [91 %-95 %] 92 % (04/08 0918) Intake/Output      04/07 0701 - 04/08 0700 04/08 0701 - 04/09 0700   P.O. 420    I.V. (mL/kg) 1659 (21.1)    Total Intake(mL/kg) 2079 (26.5)    Urine (mL/kg/hr) 1070 (0.6)    Blood     Total Output 1070     Net +1009           Awake and alert Follows commands throughout Strength appropriate Wound c/d/i  Stable Continue current care of bedrest for 5 days Dr Christella Noa to see pt tomorrow

## 2016-08-05 ENCOUNTER — Encounter (HOSPITAL_COMMUNITY): Payer: Self-pay | Admitting: Neurosurgery

## 2016-08-05 MED ORDER — KETOROLAC TROMETHAMINE 15 MG/ML IJ SOLN
7.5000 mg | Freq: Four times a day (QID) | INTRAMUSCULAR | Status: AC | PRN
  Administered 2016-08-05 – 2016-08-06 (×4): 7.5 mg via INTRAVENOUS
  Filled 2016-08-05 (×5): qty 1

## 2016-08-05 MED ORDER — KETOROLAC TROMETHAMINE 15 MG/ML IJ SOLN: 7.5000 mg | Freq: Four times a day (QID) | INTRAMUSCULAR | Status: DC

## 2016-08-05 NOTE — Progress Notes (Signed)
Patient resting in bed with eyes closed.   Alert, oriented times 4  Complaints of headache, medicated with effective results.  Patient encouraged to eat meals as appetite is decreased.  Fluids pushed.

## 2016-08-05 NOTE — Care Management Note (Signed)
Case Management Note  Patient Details  Name: Troy Combs MRN: [id number] Date of Birth: Oct 18, 1931  Subjective/Objective:    Pt s/p lumbar surgery. He is from home with his spouse. Pt currently is on day 3 of 5 for bedrest.                 Action/Plan: Awaiting PT/OT recs once patient is allowed out of bed. CM following.   Expected Discharge Date:                  Expected Discharge Plan:     In-House Referral:     Discharge planning Services     Post Acute Care Choice:    Choice offered to:     DME Arranged:    DME Agency:     HH Arranged:    HH Agency:     Status of Service:  In process, will continue to follow  If discussed at Long Length of Stay Meetings, dates discussed:    Additional Comments:  Pollie Friar, RN 08/05/2016, 10:48 AM

## 2016-08-05 NOTE — Progress Notes (Signed)
Patient ID: Troy Combs, male   DOB: [birth year], 83 y.o.   MRN: [id number] BP (!) 150/82 (BP Location: Right Arm)   Pulse 79   Temp 98.1 F (36.7 C) (Axillary)   Resp 20   Ht 5\' 9"  (1.753 m)   Wt 78.5 kg (173 lb)   SpO2 94%   BMI 25.55 kg/m  Lethargic, when awake follows all commands Moving lower extremities Wound dressing is dry today May take to the or to oversew the wound Will assess tomorrow

## 2016-08-06 ENCOUNTER — Inpatient Hospital Stay (HOSPITAL_COMMUNITY): Payer: Medicare Other | Admitting: Anesthesiology

## 2016-08-06 ENCOUNTER — Encounter (HOSPITAL_COMMUNITY)
Admission: AD | Disposition: A | Discharge status: Skilled Nursing Facility | Payer: Self-pay | Source: Ambulatory Visit | Attending: Neurosurgery

## 2016-08-06 SURGERY — LUMBAR WOUND DEBRIDEMENT
Anesthesia: General

## 2016-08-06 MED ORDER — FENTANYL CITRATE (PF) 100 MCG/2ML IJ SOLN: 25.0000 ug | INTRAMUSCULAR | Status: DC | PRN

## 2016-08-06 MED ORDER — OXYCODONE HCL 5 MG PO TABS: 5.0000 mg | ORAL_TABLET | Freq: Once | ORAL | Status: DC | PRN

## 2016-08-06 MED ORDER — LACTATED RINGERS IV SOLN
INTRAVENOUS | Status: DC
  Administered 2016-08-06: 18:00:00 via INTRAVENOUS

## 2016-08-06 MED ORDER — OXYCODONE HCL 5 MG/5ML PO SOLN: 5.0000 mg | Freq: Once | ORAL | Status: DC | PRN

## 2016-08-06 MED ORDER — BISACODYL 10 MG RE SUPP
10.0000 mg | Freq: Every day | RECTAL | Status: DC | PRN
  Administered 2016-08-06: 10 mg via RECTAL
  Filled 2016-08-06: qty 1

## 2016-08-06 NOTE — Care Management Important Message (Signed)
Important Message  Patient Details  Name: Troy Combs MRN: [id number] Date of Birth: 1931/05/11   Medicare Important Message Given:  Yes    Orbie Pyo 08/06/2016, 12:24 PM

## 2016-08-06 NOTE — Progress Notes (Signed)
Report given to short stay. Pt to OR at this time.   Ave Filter, RN

## 2016-08-06 NOTE — Progress Notes (Signed)
Patient ID: Troy Combs, male   DOB: December 15, 1931, 83 y.o.   MRN: [id number] BP (!) 165/89 (BP Location: Right Arm)   Pulse 81   Temp 97.6 F (36.4 C) (Oral)   Resp 18   Ht 5\' 9"  (1.753 m)   Wt 78.5 kg (173 lb)   SpO2 96%   BMI 25.55 kg/m  Alert and oriented x 4, wound is clean, and dry. No signs of csf leak Cancelled or since there was no leak. Continue bedrest Much improved

## 2016-08-07 NOTE — Progress Notes (Signed)
Patient ID: Troy Combs, male   DOB: [birth year], 83 y.o.   MRN: [id number] BP (!) 157/79 (BP Location: Left Arm)   Pulse 79   Temp 98.6 F (37 C) (Oral)   Resp 16   Ht 5\' 9"  (1.753 m)   Wt 78.5 kg (173 lb)   SpO2 94%   BMI 25.55 kg/m  Alert and oriented x 4 Speech is clear and fluent Moving extremities well Wound is clean, dry, no signs of infection Pt, ot consults

## 2016-08-08 NOTE — Evaluation (Signed)
Occupational Therapy Evaluation Patient Details Name: Troy Combs MRN: [id number] DOB: November 03, 1931 Today's Date: 08/08/2016    History of Present Illness Troy Combs is a gentleman on whom I took out a disc herniation at L4-5 on the right side on October 17th. He had a sudden onset of pain and is now s/p decompression of the disc herniation at L4-5. Pt's wife states that he saw a PT at [facility] for BPPV treatment recently. Pt has a past medical history including Diminished hearing, Old myocardial infarction; and hypertension; Atrial ablation surgery (05/04/2001); Coronary angioplasty (1993);  Cardiac catheterization (09/05/2008); Lumbar laminectomy/decompression microdiscectomy (02/02/2016); Eye surgery; punctured eardrum ; and Lumbar laminectomy/decompression microdiscectomy (Right, 08/02/2016).   Clinical Impression   PTA Pt independent in ADL and mobility. Pt currently mod A for ADL and sit to stand only performed this session due to fatigue of BLE and "room spinning". Please see OT problem list below. OT also noticed some facial dysymmetry and right sided lean during session when Pt was asked about his eyelid drooping he stated that "It started about 2 weeks ago". RN notified. Pt was in and out of orientation during session as well, he ripped off his bracelet and thought he was at home initially. Pt will benefit from skilled OT in the acute setting to maximize safety and independence in ADL and functional transfers. At this time, Pt will require SNF level therapy to return to PLOF.     Follow Up Recommendations  SNF;Supervision/Assistance - 24 hour    Equipment Recommendations  Toilet rise with handles (3 in 1 will not fit in their bathroom)    Recommendations for Other Services       Precautions / Restrictions Precautions Precautions: Back Precaution Booklet Issued: No Precaution Comments: Precautions taught for comfort Restrictions Weight Bearing Restrictions: No      Mobility Bed  Mobility Overal bed mobility: Needs Assistance Bed Mobility: Rolling;Sidelying to Sit;Sit to Sidelying Rolling: Mod assist Sidelying to sit: Mod assist;+2 for physical assistance;+2 for safety/equipment     Sit to sidelying: Mod assist;+2 for safety/equipment;+2 for physical assistance General bed mobility comments: verbal and tactile cues for sequencing. Pt used bed rail.  Assist to elevate and lower trunk, and legs back into bed.  Transfers Overall transfer level: Needs assistance Equipment used: Rolling walker (2 wheeled) Transfers: Sit to/from Stand Sit to Stand: Min assist;+2 safety/equipment         General transfer comment: max vc for safe hand placement (pt wanting to pull up on RW)    Balance Overall balance assessment: Needs assistance Sitting-balance support: Bilateral upper extremity supported;Feet supported Sitting balance-Leahy Scale: Poor Sitting balance - Comments: requires assist to sit EOB Postural control: Right lateral lean Standing balance support: Bilateral upper extremity supported Standing balance-Leahy Scale: Poor                             ADL either performed or assessed with clinical judgement   ADL Overall ADL's : Needs assistance/impaired Eating/Feeding: Set up;Sitting Eating/Feeding Details (indicate cue type and reason): able to feed himself sitting in bed at end of session Grooming: Set up;Sitting   Upper Body Bathing: Set up;Sitting   Lower Body Bathing: With caregiver independent assisting;Moderate assistance;Sit to/from stand   Upper Body Dressing : Minimal assistance;With caregiver independent assisting;Sitting   Lower Body Dressing: Moderate assistance;Sit to/from stand;Total assistance Lower Body Dressing Details (indicate cue type and reason): total to don socks, mod to don  underwear Toilet Transfer: Moderate assistance;+2 for safety/equipment Toilet Transfer Details (indicate cue type and reason): sit <> stand from  bed only this session Toileting- Clothing Manipulation and Hygiene: Moderate assistance Toileting - Clothing Manipulation Details (indicate cue type and reason): to manage underwear     Functional mobility during ADLs:  (sit to stand from EOB only this session)       Vision Baseline Vision/History: No visual deficits Patient Visual Report: Nausea/blurring vision with head movement Additional Comments: not assessed this session     Perception     Praxis      Pertinent Vitals/Pain Pain Assessment: 0-10 Pain Score: 5  Pain Location: back and headache Pain Descriptors / Indicators: Sore;Aching Pain Intervention(s): Limited activity within patient's tolerance;Monitored during session;Repositioned     Hand Dominance Right   Extremity/Trunk Assessment Upper Extremity Assessment Upper Extremity Assessment: Generalized weakness   Lower Extremity Assessment Lower Extremity Assessment: Defer to PT evaluation   Cervical / Trunk Assessment Cervical / Trunk Assessment: Normal   Communication Communication Communication: No difficulties   Cognition Arousal/Alertness: Suspect due to medications (Varied throughout session) Behavior During Therapy: Flat affect Overall Cognitive Status: Difficult to assess Area of Impairment: Orientation;Following commands;Safety/judgement;Attention                 Orientation Level: Disoriented to;Place;Time;Situation (thought he was at home, and ripped off his bracelets) Current Attention Level: Focused   Following Commands: Follows one step commands with increased time;Follows one step commands inconsistently Safety/Judgement: Decreased awareness of safety     General Comments: Pt requiring verbal and tactile cues to follow directions.    General Comments  Facial asymmetry noticed and asked Pt if it was new, he said he noticed his right eye drooping about 2 weeks ago. RN notified.    Exercises     Shoulder Instructions      Home  Living Family/patient expects to be discharged to:: Private residence Living Arrangements: Spouse/significant other Troy Combs, and dog) Available Help at Discharge: Family;Available 24 hours/day Type of Home: Other(Comment) (Condo) Home Access: Stairs to enter [employer] of Steps: 3 Entrance Stairs-Rails: Right;Left;Can reach both Home Layout: One level     Bathroom Shower/Tub: Occupational psychologist: Handicapped height Bathroom Accessibility: Yes How Accessible: Accessible via walker Home Equipment: Grab bars - tub/shower;Shower seat - built in;Hand held shower head (hiking stick)          Prior Functioning/Environment Level of Independence: Independent                 OT Problem List: Decreased activity tolerance;Impaired balance (sitting and/or standing);Decreased cognition;Decreased safety awareness;Decreased knowledge of use of DME or AE;Pain      OT Treatment/Interventions: Self-care/ADL training;Therapeutic exercise;Energy conservation;DME and/or AE instruction;Therapeutic activities;Cognitive remediation/compensation;Patient/family education;Balance training    OT Goals(Current goals can be found in the care plan section) Acute Rehab OT Goals Patient 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 OT Goal Formulation: With patient Time For Goal Achievement: 08/22/16 Potential to Achieve Goals: Good ADL Goals Pt Will Perform Upper Body Bathing: with min guard assist;sitting;with caregiver independent in assisting Pt Will Perform Lower Body Bathing: with min guard assist;with caregiver independent in assisting;with adaptive equipment;sit to/from stand Pt Will Transfer to Toilet: with supervision;ambulating (comfort height toilet, with toilet riser (or equivalent)) Pt Will Perform Toileting - Clothing Manipulation and hygiene: with modified independence;sit to/from stand  OT Frequency: Min 2X/week   Barriers to D/C: Decreased caregiver  support (wife is not  physically capapble of assisting)  wife VERY supportive but not capable of providing physical asisst       Co-evaluation              End of Session Equipment Utilized During Treatment: Gait belt;Rolling walker Nurse Communication: Other (comment) (facial Asymmetry and right lean)  Activity Tolerance: Patient limited by fatigue Patient left: in bed;with bed alarm set;with call bell/phone within reach;with family/visitor present  OT Visit Diagnosis: Unsteadiness on feet (R26.81);Muscle weakness (generalized) (M62.81);Other (comment);Pain (room "spinning" with head movement) Pain - Right/Left: Right Pain - part of body: Leg (back, head)                Time: 0802-2336 OT Time Calculation (min): 56 min Charges:  OT General Charges $OT Visit: 1 Procedure OT Evaluation $OT Eval Moderate Complexity: 1 Procedure OT Treatments $Self Care/Home Management : 23-37 mins $Therapeutic Activity: 8-22 mins G-Codes:     Hulda Humphrey OTR/L [place] 08/08/2016, 10:40 AM

## 2016-08-08 NOTE — Progress Notes (Signed)
   08/08/16 1224  Mechanical VTE Prophylaxis (All Areas)  Mechanical VTE Prophylaxis Sequential compression devices, below knee  Mechanical VTE Prophylaxis Intervention SCD's on, removed for 30 minutes  Pressure Ulcer Prevention  Repositioned Sitting  Positioning Frequency Able to turn self  Mobility  Activity Chair;Ambulate in room  Level of Assistance Moderate assist, patient does 50-74%  Assistive Device Front wheel walker  Distance Ambulated (ft) 10 ft  Ambulation Response Tolerated fair  Bed Position Chair  Range of Motion Active;All extremities    Patient denied pain, sob, dizziness at this time. Wife at bedside.    Ave Filter, RN

## 2016-08-08 NOTE — Progress Notes (Signed)
Rehab Admissions Coordinator Note:  Patient was screened by Cleatrice Burke for appropriateness for an Inpatient Acute Rehab Consult per PT recommendation. OT recommends SNF At this time, we are recommending Inpatient Rehab consult if pt and family would like to pursue admission to CIR rather than SNF. [employer] medicare will have to approve any rehab venue. Please advise.  Cleatrice Burke 08/08/2016, 5:43 PM  I can be reached at [phone].

## 2016-08-08 NOTE — Care Management Note (Addendum)
Case Management Note  Patient Details  Name: Troy Combs MRN: [id number] Date of Birth: [birth year]  Subjective/Objective:                    Action/Plan: Pt is off bedrest. OT recommending SNF. Awaiting PT recommendations. CM following.  Addendum 4/13 Carles Collet RN CM placed order for CSW consult for SNF backup to PT rec for CIR.   Expected Discharge Date:                  Expected Discharge Plan:     In-House Referral:     Discharge planning Services     Post Acute Care Choice:    Choice offered to:     DME Arranged:    DME Agency:     HH Arranged:    HH Agency:     Status of Service:  In process, will continue to follow  If discussed at Long Length of Stay Meetings, dates discussed:    Additional Comments:  Pollie Friar, RN 08/08/2016, 1:14 PM

## 2016-08-08 NOTE — Progress Notes (Signed)
Attempt to get pt out of bed this AM but per family at bedside to let pt sleeps for a little longer since he didn't get much sleep last night. RN verbalized will  Return to get him up around 830AM.  Ave Filter, RN

## 2016-08-08 NOTE — Progress Notes (Signed)
Patient ID: Troy Combs, male   DOB: 1931-10-16, 83 y.o.   MRN: [id number] BP 127/78 (BP Location: Left Arm)   Pulse 87   Temp 98 F (36.7 C) (Oral)   Resp 16   Ht 5\' 9"  (1.753 m)   Wt 78.5 kg (173 lb)   SpO2 97%   BMI 25.55 kg/m  Alert and oriented x 4, speech is clear and fluent Moving all extremities perrl, full eom Wound is clean, and dry Will need more pt/ot

## 2016-08-08 NOTE — Evaluation (Signed)
Physical Therapy Evaluation Patient Details Name: Troy Combs MRN: [id number] DOB: 04-29-1932 Today's Date: 08/08/2016   History of Present Illness  Pt is an 83 y/o male with a PMH significant for previous back surgery in October 2017. He had a sudden onset of pain and is now s/p decompression of the disc herniation at L4-5. Pt's wife states that he saw a PT at [facility] for BPPV treatment recently. Pt has a past medical history including, Old MI; and hypertension; Atrial ablation surgery; Coronary angioplasty;  Cardiac cath.  Clinical Impression  Pt admitted with above diagnosis. Pt currently with functional limitations due to the deficits listed below (see PT Problem List). At the time of PT eval pt was able to perform transfers and ambulation with +2 assist for balance support and safety. Pt initially difficult to arouse however once awake was eager to participate and demonstrated an overall good rehab effort. At end of session discussed with pt and wife options for continued therapy at d/c. Wife agrees that she cannot provide the physical assistance the patient requires at this time. Feel CIR would be a good option for this patient to maximize independence and decrease burden of care prior to return home with wife. Pt will benefit from skilled PT to increase their independence and safety with mobility to allow discharge to the venue listed below.       Follow Up Recommendations CIR;Supervision/Assistance - 24 hour    Equipment Recommendations  Rolling walker with 5" wheels    Recommendations for Other Services       Precautions / Restrictions Precautions Precautions: Back Precaution Booklet Issued: No Precaution Comments: Precautions taught for comfort Restrictions Weight Bearing Restrictions: No      Mobility  Bed Mobility Overal bed mobility: Needs Assistance Bed Mobility: Rolling;Sidelying to Sit Rolling: Min assist Sidelying to sit: Mod assist       General bed mobility  comments: VC's for sequencing and log roll technique. Assist required to elevate trunk to full sitting position.   Transfers Overall transfer level: Needs assistance Equipment used: Rolling walker (2 wheeled) Transfers: Sit to/from Stand Sit to Stand: Min assist;+2 safety/equipment         General transfer comment: VC's for hand placement on seated surface for safety.  Increased time and attempts required for successful stand. Transitions appear very effortful.   Ambulation/Gait Ambulation/Gait assistance: Mod assist;+2 physical assistance;+2 safety/equipment Ambulation Distance (Feet): 20 Feet Assistive device: Rolling walker (2 wheeled) Gait Pattern/deviations: Step-through pattern;Decreased stride length;Shuffle;Ataxic;Trunk flexed;Narrow base of support Gait velocity: Decreased Gait velocity interpretation: Below normal speed for age/gender General Gait Details: VC's for improved posture and closer walker proximity. Pt required increased assist for balance as well as support as knees were buckling at times. Close chair follow required and utilized.   Stairs            Wheelchair Mobility    Modified Rankin (Stroke Patients Only)       Balance Overall balance assessment: Needs assistance Sitting-balance support: Bilateral upper extremity supported;Feet supported Sitting balance-Leahy Scale: Poor Sitting balance - Comments: requires assist to sit EOB Postural control: Right lateral lean Standing balance support: Bilateral upper extremity supported Standing balance-Leahy Scale: Poor                               Pertinent Vitals/Pain Pain Assessment: Faces Faces Pain Scale: Hurts even more Pain Location: back Pain Descriptors / Indicators: Sore;Aching Pain Intervention(s): Monitored  during session;Repositioned    Home Living Family/patient expects to be discharged to:: Private residence Living Arrangements: Spouse/significant other Troy Combs, and  dog) Available Help at Discharge: Family;Available 24 hours/day Type of Home: Other(Comment) (Condo) Home Access: Stairs to enter Entrance Stairs-Rails: Right;Left;Can reach both Entrance Stairs-Number of Steps: 3 Home Layout: One level Home Equipment: Grab bars - tub/shower;Shower seat - built in;Hand held shower head (hiking stick)      Prior Function Level of Independence: Independent               Hand Dominance   Dominant Hand: Right    Extremity/Trunk Assessment   Upper Extremity Assessment Upper Extremity Assessment: Defer to OT evaluation    Lower Extremity Assessment Lower Extremity Assessment: Generalized weakness (Consistent with pre-op diagnosis)    Cervical / Trunk Assessment Cervical / Trunk Assessment: Kyphotic;Other exceptions (s/p surgery)  Communication   Communication: No difficulties  Cognition Arousal/Alertness: Awake/alert (Difficult to awaken initially) Behavior During Therapy: WFL for tasks assessed/performed Overall Cognitive Status: Within Functional Limits for tasks assessed                                        General Comments      Exercises     Assessment/Plan    PT Assessment Patient needs continued PT services  PT Problem List Decreased strength;Decreased range of motion;Decreased activity tolerance;Decreased balance;Decreased mobility;Decreased knowledge of use of DME;Decreased safety awareness;Decreased knowledge of precautions;Pain       PT Treatment Interventions DME instruction;Gait training;Stair training;Functional mobility training;Therapeutic activities;Therapeutic exercise;Neuromuscular re-education;Patient/family education    PT Goals (Current goals can be found in the Care Plan section)  Acute Rehab PT Goals Patient 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 PT Goal Formulation: With patient/family Time For Goal Achievement: 08/15/16 Potential to Achieve Goals: Good    Frequency Min  5X/week   Barriers to discharge        Co-evaluation               End of Session Equipment Utilized During Treatment: Gait belt Activity Tolerance: Patient tolerated treatment well Patient left: in chair;with call bell/phone within reach;with chair alarm set;with family/visitor present;with nursing/sitter in room Nurse Communication: Mobility status PT Visit Diagnosis: Unsteadiness on feet (R26.81);Pain;Ataxic gait (R26.0) Pain - part of body:  (Back)    Time: 8921-1941 PT Time Calculation (min) (ACUTE ONLY): 35 min   Charges:   PT Evaluation $PT Eval Moderate Complexity: 1 Procedure PT Treatments $Gait Training: 8-22 mins   PT G Codes:        Rolinda Roan, PT, DPT Acute Rehabilitation Services Pager: [phone]   Thelma Comp 08/08/2016, 3:15 PM

## 2016-08-09 DIAGNOSIS — R5381 Other malaise: Secondary | ICD-10-CM

## 2016-08-09 DIAGNOSIS — M5126 Other intervertebral disc displacement, lumbar region: Principal | ICD-10-CM

## 2016-08-09 NOTE — Progress Notes (Signed)
Occupational Therapy Treatment Patient Details Name: Troy Combs MRN: [id number] DOB: 1932-03-11 Today's Date: 08/09/2016    History of present illness Pt is an 83 y/o male with a PMH significant for previous back surgery in October 2017. He had a sudden onset of pain and is now s/p decompression of the disc herniation at L4-5. Pt's wife states that he saw a PT at [facility] for BPPV treatment recently. Pt has a past medical history including, Old MI; and hypertension; Atrial ablation surgery; Coronary angioplasty;  Cardiac cath.   OT comments  Pt making progress towards goals. No complaints of dizziness or spinning today. Session focused on toilet transfer and peri care, sink level grooming. Please see more detailed information below. Discharge planned changed as Pt more mobile and alert today, motivated to return to PLOF. Pt will require CIR level therapy to maximize safety and independence in ADL and functional transfers. Next session to continue to focus on continued tolerance for standing balance required for ADL and increasing independence for transfers.   Follow Up Recommendations  CIR    Equipment Recommendations  Toilet rise with handles (3 in 1 will not fit in their bathroom)    Recommendations for Other Services      Precautions / Restrictions Precautions Precautions: Back Precaution Booklet Issued: No Precaution Comments: Precautions taught for comfort Restrictions Weight Bearing Restrictions: No       Mobility Bed Mobility Overal bed mobility: Needs Assistance Bed Mobility: Rolling;Sidelying to Sit Rolling: Min assist Sidelying to sit: Mod assist       General bed mobility comments: assist for trunk elevation, assist to scoot hips to EOB (use of bed pad)  Transfers Overall transfer level: Needs assistance Equipment used: Rolling walker (2 wheeled) Transfers: Sit to/from Stand Sit to Stand: Mod assist         General transfer comment: Cues for hand placement  to push from seated surface and avoid pulling on RW.  Pt required cues for knee, hip and trunk extension in standing.      Balance Overall balance assessment: Needs assistance Sitting-balance support: Bilateral upper extremity supported;Feet supported Sitting balance-Leahy Scale: Fair Sitting balance - Comments: requires assist to sit EOB   Standing balance support: Bilateral upper extremity supported Standing balance-Leahy Scale: Poor Standing balance comment: As patient stood at sink for ADLs with OT hip began to present with slow buckling, R greater than L.  Cues for erect stance.  Pt unaware of deficits as he began to fatigue,  he remained focused on finishing ADL task in standing.                             ADL either performed or assessed with clinical judgement   ADL Overall ADL's : Needs assistance/impaired     Grooming: Wash/dry hands;Wash/dry face;Oral care;Moderate assistance;Standing Grooming Details (indicate cue type and reason): Pt fatigues quickly with RLE bucking at knee occationally. Pt needed verbal and phyical cues to maintain appropriate posture (Pt unaware of fatigue) during grooming                 Toilet Transfer: Maximal assistance;Stand-pivot;BSC Toilet Transfer Details (indicate cue type and reason): Pt stand pivot to [facility] with max assist from therapist (no DME) due to urgency. verbal cues for sequencing, Pt with posterior lean during transfer Toileting- Clothing Manipulation and Hygiene: Maximal assistance;Sit to/from stand;+2 for safety/equipment Toileting - Clothing Manipulation Details (indicate cue type and reason): use of  RW for standing balance and assist from therapist, Pt dependent on BUE support on walker, so total assist for rear peri care (Pt able to use washcloth to clean front peri area and thighs)     Functional mobility during ADLs: Moderate assistance;+2 for safety/equipment;Rolling walker General ADL Comments: Pt very pleasant  and cooperative. Excited to work with therapy.      Vision   Vision Assessment?: No apparent visual deficits   Perception     Praxis      Cognition Arousal/Alertness: Awake/alert Behavior During Therapy: WFL for tasks assessed/performed Overall Cognitive Status: Within Functional Limits for tasks assessed                                          Exercises     Shoulder Instructions       General Comments      Pertinent Vitals/ Pain       Pain Assessment: 0-10 Pain Score: 5  Pain Location: back Pain Descriptors / Indicators: Sore;Aching Pain Intervention(s): Limited activity within patient's tolerance;Monitored during session;Repositioned  Home Living                                          Prior Functioning/Environment              Frequency  Min 2X/week        Progress Toward Goals  OT Goals(current goals can now be found in the care plan section)  Progress towards OT goals: Progressing toward goals  Acute Rehab OT Goals Patient 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 OT Goal Formulation: With patient Time For Goal Achievement: 08/22/16 Potential to Achieve Goals: Good  Plan Discharge plan needs to be updated;Frequency needs to be updated    Co-evaluation    PT/OT/SLP Co-Evaluation/Treatment: Yes Reason for Co-Treatment: For patient/therapist safety PT goals addressed during session: Mobility/safety with mobility;Balance OT goals addressed during session: ADL's and self-care      End of Session Equipment Utilized During Treatment: Gait belt;Rolling walker  OT Visit Diagnosis: Unsteadiness on feet (R26.81);Muscle weakness (generalized) (M62.81);Other (comment);Pain Pain - Right/Left: Right Pain - part of body: Leg (back)   Activity Tolerance Patient tolerated treatment well   Patient Left in chair;with call bell/phone within reach;with chair alarm set;with family/visitor present   Nurse  Communication Mobility status        Time: 3016-0109 OT Time Calculation (min): 33 min  Charges: OT General Charges $OT Visit: 1 Procedure OT Treatments $Self Care/Home Management : 8-22 mins  Hulda Humphrey OTR/L [place] 08/09/2016, 1:26 PM

## 2016-08-09 NOTE — NC FL2 (Signed)
[place] LEVEL OF CARE SCREENING TOOL     IDENTIFICATION  Patient Name: Troy Combs Birthdate: 20-Jul-1931 Sex: male Admission Date (Current Location): 08/02/2016  [facility] [place] Number:  Herbalist and Address:  [facility] [address], [place], [place] [address]      Provider Number: [id number]  Attending Physician Name and Address:  Ashok Pall, MD  Relative Name and Phone Number:       Current Level of Care: Hospital Recommended Level of Care: [place] Prior Approval Number:    Date Approved/Denied:   PASRR Number: [id number]  Discharge Plan: SNF    Current Diagnoses: Patient Active Problem List   Diagnosis Date Noted  . Hyperglycemia 07/07/2016  . HNP (herniated nucleus pulposus), lumbar 02/02/2016  . Spinal stenosis of lumbar region 01/24/2016  . Sciatica, right side 01/24/2016  . Prediabetes 01/24/2016  . Otitis, externa, infective 01/24/2016  . Left lumbar radiculopathy 01/15/2016  . PVC's (premature ventricular contractions) 05/24/2015  . Peripheral polyneuropathy ([place]) 03/06/2015  . BPPV (benign paroxysmal positional vertigo) 03/06/2015  . Headache 01/18/2015  . Vertigo 01/18/2015  . Allergic rhinitis 09/20/2014  . DOE (dyspnea on exertion) 08/10/2014  . Severe dizziness 08/10/2014  . Acute diverticulitis 05/18/2014  . Diarrhea 05/18/2014  . LLQ pain 05/13/2014  . Chronic meniscal tear of knee 09/22/2013  . Primary localized osteoarthrosis, lower leg 09/22/2013  . Neck pain, bilateral 12/18/2012  . Headache(784.0) 12/18/2012  . Binocular visual disturbance 12/18/2012  . Epiretinal membrane 12/17/2012  . Status post intraocular lens implant 12/17/2012  . Encounter for well adult exam with abnormal findings 12/02/2012  . Impaired glucose tolerance 12/02/2012  . Low back pain 12/02/2012  . Palpitations 10/09/2010  . B12 deficiency 08/30/2010  . Hearing loss 08/30/2010   . Tinnitus 08/30/2010  . Orthostatic hypotension 06/28/2009  . HYPERLIPIDEMIA TYPE IIB / III 05/15/2009  . CHEST PAIN UNSPECIFIED 11/23/2008  . Hypotension, unspecified 10/12/2008  . DIZZINESS 10/12/2008  . Shortness of breath 09/14/2008  . UNSPECIFIED ANEMIA 09/13/2008  . ANXIETY DEPRESSION 09/13/2008  . Old myocardial infarction 09/13/2008  . GASTROESOPHAGEAL REFLUX DISEASE, HX OF 09/13/2008  . LUMBAR RADICULOPATHY, LEFT 08/23/2008  . MELANOMA, SHOULDER 11/27/2006  . Essential hypertension 11/27/2006  . Coronary atherosclerosis 11/27/2006  . OBSTRUCTION, BLADDER NECK 11/27/2006  . ERECTILE DYSFUNCTION, ORGANIC 11/27/2006    Orientation RESPIRATION BLADDER Height & Weight     Self, Time, Situation, Place  Normal External catheter Weight: 173 lb (78.5 kg) Height:  5\' 9"  (175.3 cm)  BEHAVIORAL SYMPTOMS/MOOD NEUROLOGICAL BOWEL NUTRITION STATUS      Continent Diet (Regular diet; thin fluids)  AMBULATORY STATUS COMMUNICATION OF NEEDS Skin   Limited Assist Verbally Normal                       Personal Care Assistance Level of Assistance  Bathing, Feeding, Dressing Bathing Assistance: Maximum assistance Feeding assistance: Limited assistance Dressing Assistance: Maximum assistance     Functional Limitations Info  Sight, Hearing, Speech Sight Info: Adequate Hearing Info: Adequate Speech Info: Adequate    SPECIAL CARE FACTORS FREQUENCY  PT (By licensed PT), OT (By licensed OT)     PT Frequency: 5x OT Frequency: 2x            Contractures Contractures Info: Not present    Additional Factors Info  Code Status, Allergies, Psychotropic Code Status Info: Full Allergies Info: Iohexol, Ciprofloxacin, Other Psychotropic Info: sertraline (  ZOLOFT)         Current Medications (08/09/2016):  This is the current hospital active medication list Current Facility-Administered Medications  Medication Dose Route Frequency Provider Last Rate Last Dose  . 0.9 %  sodium  chloride infusion  250 mL Intravenous Continuous Ashok Pall, MD      . 0.9 % NaCl with KCl 20 mEq/ L  infusion   Intravenous Continuous Ashok Pall, MD 80 mL/hr at 08/07/16 1755    . acetaminophen (TYLENOL) tablet 650 mg  650 mg Oral Q4H PRN Ashok Pall, MD   650 mg at 08/09/16 5366   Or  . acetaminophen (TYLENOL) suppository 650 mg  650 mg Rectal Q4H PRN Ashok Pall, MD      . albuterol (PROVENTIL) (2.5 MG/3ML) 0.083% nebulizer solution 2.5 mg  2.5 mg Inhalation Q6H PRN Ashok Pall, MD      . aspirin EC tablet 81 mg  81 mg Oral QHS Ashok Pall, MD   81 mg at 08/09/16 2034  . bisacodyl (DULCOLAX) EC tablet 5 mg  5 mg Oral Daily PRN Ashok Pall, MD      . bisacodyl (DULCOLAX) suppository 10 mg  10 mg Rectal Daily PRN Ashok Pall, MD   10 mg at 08/06/16 1026  . carvedilol (COREG) tablet 3.125 mg  3.125 mg Oral QHS Ashok Pall, MD   3.125 mg at 08/09/16 2034  . diazepam (VALIUM) tablet 5 mg  5 mg Oral Q6H PRN Ashok Pall, MD   5 mg at 08/08/16 0220  . finasteride (PROSCAR) tablet 5 mg  5 mg Oral QHS Ashok Pall, MD   5 mg at 08/09/16 2034  . lactated ringers infusion   Intravenous Continuous Oleta Mouse, MD 10 mL/hr at 08/06/16 1740    . magnesium citrate solution 1 Bottle  1 Bottle Oral Once PRN Ashok Pall, MD      . menthol-cetylpyridinium (CEPACOL) lozenge 3 mg  1 lozenge Oral PRN Ashok Pall, MD       Or  . phenol (CHLORASEPTIC) mouth spray 1 spray  1 spray Mouth/Throat PRN Ashok Pall, MD      . morphine 2 MG/ML injection 2 mg  2 mg Intravenous Q2H PRN Ashok Pall, MD   2 mg at 08/03/16 2133  . nitroGLYCERIN (NITROSTAT) SL tablet 0.4 mg  0.4 mg Sublingual Q5 min PRN Ashok Pall, MD      . nortriptyline (PAMELOR) capsule 25 mg  25 mg Oral QHS Ashok Pall, MD   25 mg at 08/09/16 2039  . ondansetron (ZOFRAN) tablet 4 mg  4 mg Oral Q6H PRN Ashok Pall, MD       Or  . ondansetron (ZOFRAN) injection 4 mg  4 mg Intravenous Q6H PRN Ashok Pall, MD      . oxyCODONE (Oxy  IR/ROXICODONE) immediate release tablet 5-10 mg  5-10 mg Oral Q3H PRN Ashok Pall, MD   5 mg at 08/07/16 1005  . pantoprazole (PROTONIX) EC tablet 40 mg  40 mg Oral Daily Ashok Pall, MD   40 mg at 08/09/16 0903  . rosuvastatin (CRESTOR) tablet 10 mg  10 mg Oral QHS Ashok Pall, MD   10 mg at 08/09/16 2034  . senna (SENOKOT) tablet 8.6 mg  1 tablet Oral BID Ashok Pall, MD   8.6 mg at 08/09/16 2034  . senna-docusate (Senokot-S) tablet 1 tablet  1 tablet Oral QHS PRN Ashok Pall, MD      . sertraline (ZOLOFT) tablet 50 mg  50 mg  Oral QPM Ashok Pall, MD   50 mg at 08/09/16 1803  . sodium chloride flush (NS) 0.9 % injection 3 mL  3 mL Intravenous Q12H Ashok Pall, MD   3 mL at 08/06/16 2206  . sodium chloride flush (NS) 0.9 % injection 3 mL  3 mL Intravenous PRN Ashok Pall, MD      . tamsulosin (FLOMAX) capsule 0.4 mg  0.4 mg Oral QHS Ashok Pall, MD   0.4 mg at 08/09/16 2034  . zolpidem (AMBIEN) tablet 5 mg  5 mg Oral QHS PRN Ashok Pall, MD   5 mg at 08/02/16 2216     Discharge Medications: Please see discharge summary for a list of discharge medications.  Relevant Imaging Results:  Relevant Lab Results:   Additional Information SSN: [id number]  Truitt Merle, LCSW

## 2016-08-09 NOTE — Clinical Social Work Placement (Signed)
   CLINICAL SOCIAL WORK PLACEMENT  NOTE  Date:  08/09/2016  Patient Details  Name: Troy Combs MRN: [id number] Date of Birth: Feb 11, 1932  Clinical Social Work is seeking post-discharge placement for this patient at the [place] level of care (*CSW will initial, date and re-position this form in  chart as items are completed):  Yes   Patient/family provided with [place] Work Department's list of facilities offering this level of care within the geographic area requested by the patient (or if unable, by the patient's family).  Yes   Patient/family informed of their freedom to choose among providers that offer the needed level of care, that participate in Medicare, Medicaid or managed care program needed by the patient, have an available bed and are willing to accept the patient.  Yes   Patient/family informed of [facility]'s ownership interest in [facility] and [facility], as well as of the fact that they are under no obligation to receive care at these facilities.  PASRR submitted to EDS on 08/09/16     PASRR number received on 08/09/16     Existing PASRR number confirmed on       FL2 transmitted to all facilities in geographic area requested by pt/family on 08/09/16     FL2 transmitted to all facilities within larger geographic area on       Patient informed that his/her managed care company has contracts with or will negotiate with certain facilities, including the following:            Patient/family informed of bed offers received.  Patient chooses bed at       Physician recommends and patient chooses bed at      Patient to be transferred to   on  .  Patient to be transferred to facility by       Patient family notified on   of transfer.  Name of family member notified:        PHYSICIAN Please sign FL2, Please prepare priority discharge summary, including medications, Please prepare prescriptions     Additional  Comment:    _______________________________________________ Truitt Merle, LCSW 08/09/2016, 10:03 PM

## 2016-08-09 NOTE — Progress Notes (Signed)
Physical Therapy Treatment Patient Details Name: Troy Combs MRN: [id number] DOB: November 10, 1931 Today's Date: 08/09/2016    History of Present Illness Pt is an 83 y/o male with a PMH significant for previous back surgery in October 2017. He had a sudden onset of pain and is now s/p decompression of the disc herniation at L4-5. Pt's wife states that he saw a PT at [facility] for BPPV treatment recently. Pt has a past medical history including, Old MI; and hypertension; Atrial ablation surgery; Coronary angioplasty;  Cardiac cath.    PT Comments    Pt performed increased activity and  Remains to require external assist for safe mobility.  Pt able to tolerate standing and increased gait.  Pt remains an appropriate candidate for CIR to address strength and mobility deficits.  Pt with poor tolerance to prolonged standing during ADL tasks.     Follow Up Recommendations  CIR;Supervision/Assistance - 24 hour     Equipment Recommendations  Rolling walker with 5" wheels    Recommendations for Other Services       Precautions / Restrictions Precautions Precautions: Back Precaution Booklet Issued: No Precaution Comments: Precautions taught for comfort Restrictions Weight Bearing Restrictions: No    Mobility  Bed Mobility               General bed mobility comments: Pt sitting on commode with OT present on arrival.    Transfers Overall transfer level: Needs assistance Equipment used: Rolling walker (2 wheeled) Transfers: Sit to/from Stand Sit to Stand: Mod assist         General transfer comment: Cues for hand placement to push from seated surface and avoid pulling on RW.  Pt required cues for knee, hip and trunk extension in standing.    Ambulation/Gait Ambulation/Gait assistance: Mod assist;Min assist Ambulation Distance (Feet): 85 Feet Assistive device: Rolling walker (2 wheeled) Gait Pattern/deviations: Step-through pattern;Trunk flexed;Antalgic;Decreased stride  length Gait velocity: Decreased Gait velocity interpretation: Below normal speed for age/gender General Gait Details: VCs for posture and RW proximity.  Cues for keeping RW close during turns and required assist when turning RW.  Pt without buckling during gait but presents with R lateral lean.  Constant cueing for posture and increasing B stride length.     Stairs            Wheelchair Mobility    Modified Rankin (Stroke Patients Only)       Balance Overall balance assessment: Needs assistance Sitting-balance support: Bilateral upper extremity supported;Feet supported Sitting balance-Leahy Scale: Fair       Standing balance-Leahy Scale: Poor Standing balance comment: As patient stood at sink for ADLs with OT hip began to present with slow buckling, R greater than L.  Cues for erect stance.  Pt unaware of deficits as he began to fatigue,  he remained focused on finishing ADL task in standing.                              Cognition Arousal/Alertness: Awake/alert Behavior During Therapy: WFL for tasks assessed/performed Overall Cognitive Status: Within Functional Limits for tasks assessed                                        Exercises      General Comments        Pertinent Vitals/Pain Pain Assessment: Faces Pain Score:  5  Pain Location: back Pain Descriptors / Indicators: Sore;Aching Pain Intervention(s): Monitored during session;Repositioned    Home Living                      Prior Function            PT Goals (current goals can now be found in the care plan section) Acute Rehab PT Goals Patient 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 Potential to Achieve Goals: Good Progress towards PT goals: Progressing toward goals    Frequency           PT Plan Current plan remains appropriate    Co-evaluation PT/OT/SLP Co-Evaluation/Treatment: Yes Reason for Co-Treatment: For patient/therapist  safety PT goals addressed during session: Mobility/safety with mobility;Balance OT goals addressed during session: ADL's and self-care     End of Session Equipment Utilized During Treatment: Gait belt Activity Tolerance: Patient tolerated treatment well Patient left: in chair;with call bell/phone within reach;with chair alarm set;with family/visitor present Nurse Communication: Mobility status PT Visit Diagnosis: Unsteadiness on feet (R26.81);Pain;Ataxic gait (R26.0) Pain - part of body:  (Back)     Time: 5830-9407 PT Time Calculation (min) (ACUTE ONLY): 20 min  Charges:  $Gait Training: 8-22 mins                    G Codes:       Governor Rooks, PTA pager [phone]    Cristela Blue 08/09/2016, 11:17 AM

## 2016-08-09 NOTE — Consult Note (Signed)
Physical Medicine and Rehabilitation Consult Reason for Consult: Lumbar radiculopathy Referring Physician: Dr. Cyndy Freeze   HPI: Troy Combs is a 83 y.o. right handed male history of CAD with CABG maintained on aspirin, lumbar laminectomy decompression 02/02/2016. Per chart review patient lives with spouse. One level home 3 steps to entry. Reported to be independent prior to admission. Presented 08/02/2016 with progressive low back pain. No bowel or bladder dysfunction. Denied any recent trauma. 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 Underwent microdiscectomy lumbar 4-5 08/02/2016 per Dr. Christella Noa. Hospital course pain management. Physical therapy evaluation completed 08/08/2016 with recommendations of physical medicine rehabilitation consult.   Review of Systems  Constitutional: Negative for chills and fever.  HENT: Negative for hearing loss.   Eyes: Negative for blurred vision and double vision.  Respiratory: Negative for shortness of breath.   Cardiovascular: Positive for palpitations and leg swelling. Negative for chest pain.  Gastrointestinal: Positive for constipation. Negative for nausea and vomiting.       GERD  Genitourinary: Positive for frequency and urgency. Negative for dysuria and hematuria.  Musculoskeletal: Positive for back pain and neck pain.  Skin: Negative for rash.  Neurological: Negative for seizures.  Psychiatric/Behavioral: Positive for depression.  All other systems reviewed and are negative.  Past Medical History:  Diagnosis Date  . Allergic rhinitis 09/20/2014  . Anemia, unspecified   . Arthritis   . Bladder neck obstruction   . Cataracts, bilateral   . Coronary atherosclerosis of unspecified type of vessel, native or graft   . Diminished hearing, bilateral   . Dysthymic disorder   . GERD (gastroesophageal reflux disease)   . Heart murmur    as a child  . Impaired glucose tolerance 12/02/2012  . Impotence of organic origin     . Lumbar radiculopathy   . Malignant melanoma of skin of upper limb, including shoulder ([place])   . Old myocardial infarction   . Personal history of malignant melanoma of skin   . Personal history of other diseases of digestive system   . Thoracic or lumbosacral neuritis or radiculitis, unspecified   . Unspecified essential hypertension    Past Surgical History:  Procedure Laterality Date  . ARTHRODESIS  07/06/2002   of left long finger distal interphalangeal joint with Kirschner wire fixation X 3  . ATRIAL ABLATION SURGERY  05/04/2001   Dr. [name]  . CARDIAC CATHETERIZATION  09/05/2008   Revealing 4 of 4 patent grafts with native multivessel coronary artery disease, EF  of 60% without regional wall motion abnormalities.  Marland Kitchen CATARACT EXTRACTION, BILATERAL    . CHOLECYSTECTOMY    . COLONOSCOPY    . CORONARY ANGIOPLASTY  1993  . CORONARY ARTERY BYPASS GRAFT  10/17/2000   [name]. [name], [place]     AFTER LIVER TRAUMA AND HAND SURGERY  . EYE SURGERY    . HERNIA REPAIR     BILATERAL  . LUMBAR LAMINECTOMY/DECOMPRESSION MICRODISCECTOMY N/A 02/02/2016   Procedure: MICRODISCECTOMY LUMBAR FOUR- LUMBAR FIVE;  Surgeon: Ashok Pall, MD;  Location: [place];  Service: Neurosurgery;  Laterality: N/A;  MICRODISCECTOMY L4-L5  . LUMBAR LAMINECTOMY/DECOMPRESSION MICRODISCECTOMY Right 08/02/2016   Procedure: MICRODISCECTOMY LUMBAR FOUR- LUMBAR FIVE RIGHT;  Surgeon: Ashok Pall, MD;  Location: [place];  Service: Neurosurgery;  Laterality: Right;  . punctured eardrum      to relieve blood build up   for ear infection  . TONSILLECTOMY     Family  History  Problem Relation Age of Onset  . Heart disease Father   . Ataxia Neg Hx   . Chorea Neg Hx   . Dementia Neg Hx   . Mental retardation Neg Hx   . Migraines Neg Hx   . Multiple sclerosis Neg Hx   . Neurofibromatosis Neg Hx   . Neuropathy Neg Hx   . Parkinsonism Neg Hx   . Seizures Neg Hx   . Stroke Neg Hx    Social  History:  reports that he quit smoking about 60 years ago. He has never used smokeless tobacco. He reports that he drinks alcohol. He reports that he does not use drugs. Allergies:  Allergies  Allergen Reactions  . Iohexol Other (See Comments)     Code: HIVES, Desc: PATENT STATES HE IS ALLERGIC TO IV DYE 09/14/08/RM, Onset Date: [id number]   . Ciprofloxacin Hives and Rash    "Patient unsure" just know he has a reaction to med Medical record indicates allergic reaction  . Other     UNSPECIFIED REACTION  Angioplasty dye pt.>> Pt only recalls Had "very bad reaction"   Medications Prior to Admission  Medication Sig Dispense Refill  . albuterol (PROVENTIL HFA;VENTOLIN HFA) 108 (90 BASE) MCG/ACT inhaler Inhale 2 puffs into the lungs every 6 (six) hours as needed for wheezing or shortness of breath. 1 Inhaler 11  . aspirin EC 81 MG tablet Take 81 mg by mouth at bedtime.     . carvedilol (COREG) 3.125 MG tablet Take 1 tablet (3.125 mg total) by mouth at bedtime. 90 tablet 1  . finasteride (PROSCAR) 5 MG tablet Take 5 mg by mouth at bedtime.     . nitroGLYCERIN (NITROSTAT) 0.4 MG SL tablet Place 1 tablet (0.4 mg total) under the tongue every 5 (five) minutes as needed for chest pain (x 3 doses). Reported on 06/15/2015 25 tablet 6  . nortriptyline (PAMELOR) 25 MG capsule Take 1 capsule (25 mg total) by mouth at bedtime. 90 capsule 2  . omeprazole (PRILOSEC) 20 MG capsule Take 20 mg by mouth at bedtime.    . rosuvastatin (CRESTOR) 10 MG tablet Take 10 mg by mouth at bedtime.     . sertraline (ZOLOFT) 50 MG tablet TAKE 1 TABLET DAILY (Patient taking differently: TAKE 1 TABLET DAILY IN THE EVENING) 90 tablet 3  . tamsulosin (FLOMAX) 0.4 MG CAPS capsule TAKE 1 CAPSULE DAILY (Patient taking differently: TAKE 1 CAPSULE AT BEDTIME.) 90 capsule 1  . traMADol (ULTRAM) 50 MG tablet Take 50 mg by mouth every 6 (six) hours as needed for pain.      Home: Home Living Family/patient expects to be discharged to::  Private residence Living Arrangements: Spouse/significant other Troy Combs, and dog) Available Help at Discharge: Family, Available 24 hours/day Type of Home: Other(Comment) (Condo) Home Access: Stairs to enter [employer] of Steps: 3 Entrance Stairs-Rails: Right, Left, Can reach both Home Layout: One level Bathroom Shower/Tub: Multimedia programmer: Handicapped height Bathroom Accessibility: Yes Home Equipment: Grab bars - tub/shower, Shower seat - built in, Hand held shower head (hiking stick)  Functional History: Prior Function Level of Independence: Independent Functional Status:  Mobility: Bed Mobility Overal bed mobility: Needs Assistance Bed Mobility: Rolling, Sidelying to Sit Rolling: Min assist Sidelying to sit: Mod assist Sit to sidelying: Mod assist, +2 for safety/equipment, +2 for physical assistance General bed mobility comments: VC's for sequencing and log roll technique. Assist required to elevate trunk to full sitting position.  Transfers Overall transfer  level: Needs assistance Equipment used: Rolling walker (2 wheeled) Transfers: Sit to/from Stand Sit to Stand: Min assist, +2 safety/equipment General transfer comment: VC's for hand placement on seated surface for safety.  Increased time and attempts required for successful stand. Transitions appear very effortful.  Ambulation/Gait Ambulation/Gait assistance: Mod assist, +2 physical assistance, +2 safety/equipment Ambulation Distance (Feet): 20 Feet Assistive device: Rolling walker (2 wheeled) Gait Pattern/deviations: Step-through pattern, Decreased stride length, Shuffle, Ataxic, Trunk flexed, Narrow base of support General Gait Details: VC's for improved posture and closer walker proximity. Pt required increased assist for balance as well as support as knees were buckling at times. Close chair follow required and utilized.  Gait velocity: Decreased Gait velocity interpretation: Below normal  speed for age/gender    ADL: ADL Overall ADL's : Needs assistance/impaired Eating/Feeding: Set up, Sitting Eating/Feeding Details (indicate cue type and reason): able to feed himself sitting in bed at end of session Grooming: Set up, Sitting Upper Body Bathing: Set up, Sitting Lower Body Bathing: With caregiver independent assisting, Moderate assistance, Sit to/from stand Upper Body Dressing : Minimal assistance, With caregiver independent assisting, Sitting Lower Body Dressing: Moderate assistance, Sit to/from stand, Total assistance Lower Body Dressing Details (indicate cue type and reason): total to don socks, mod to don underwear Toilet Transfer: Moderate assistance, +2 for safety/equipment Toilet Transfer Details (indicate cue type and reason): sit <> stand from bed only this session Toileting- Clothing Manipulation and Hygiene: Moderate assistance Toileting - Clothing Manipulation Details (indicate cue type and reason): to manage underwear Functional mobility during ADLs:  (sit to stand from EOB only this session)  Cognition: Cognition Overall Cognitive Status: Within Functional Limits for tasks assessed Orientation Level: Oriented X4 Cognition Arousal/Alertness: Awake/alert (Difficult to awaken initially) Behavior During Therapy: WFL for tasks assessed/performed Overall Cognitive Status: Within Functional Limits for tasks assessed Area of Impairment: Orientation, Following commands, Safety/judgement, Attention Orientation Level: Disoriented to, Place, Time, Situation (thought he was at home, and ripped off his bracelets) Current Attention Level: Focused Following Commands: Follows one step commands with increased time, Follows one step commands inconsistently Safety/Judgement: Decreased awareness of safety General Comments: Pt requiring verbal and tactile cues to follow directions.  Difficult to assess due to: Level of arousal  Blood pressure (!) 162/77, pulse 73,  temperature 97.5 F (36.4 C), temperature source Oral, resp. rate 16, height 5\' 9"  (1.753 m), weight 78.5 kg (173 lb), SpO2 94 %. Physical Exam  Constitutional: He is oriented to person, place, and time. He appears well-developed.  HENT:  Head: Normocephalic.  Eyes: EOM are normal.  Neck:  He does have some decreased range of motion in his neck with rotation no crepitus  Cardiovascular: Normal rate and regular rhythm.   Respiratory: Effort normal and breath sounds normal. No respiratory distress.  GI: Soft. Bowel sounds are normal. He exhibits no distension.  Neurological: He is alert and oriented to person, place, and time. No cranial nerve deficit. He exhibits normal muscle tone.   4/5 deltoid, 4/5 bicep, 4/5 tricep, 4/5 wrist extension, 4/5 hand intrinsics.      3/5 hip flexor, 3/5 knee extension, 4/5 ankle dorsiflexion, 4/5 ankle plantarflexion. No sensory findings. DTr's 1+.    Skin:  Back incision is dressed  Psychiatric: He has a normal mood and affect. His behavior is normal.    No results found for this or any previous visit (from the past 24 hour(s)). No results found.  Assessment/Plan: Diagnosis: Debility related to lumbar HNP, recent physical decline due to  severe pain. Pt is s/p L4-5 discectomy after previous back surgery in October 1. Does the need for close, 24 hr/day medical supervision in concert with the patient's rehab needs make it unreasonable for this patient to be served in a less intensive setting? Yes 2. Co-Morbidities requiring supervision/potential complications: HTN, hx of MI, pain mgt, wound care.  3. Due to bladder management, bowel management, safety, skin/wound care, disease management, medication administration, pain management and patient education, does the patient require 24 hr/day rehab nursing? Yes 4. Does the patient require coordinated care of a physician, rehab nurse, PT (1-2 hrs/day, 5 days/week) and OT (1-2 hrs/day, 5 days/week) to address  physical and functional deficits in the context of the above medical diagnosis(es)? Yes Addressing deficits in the following areas: balance, endurance, locomotion, strength, transferring, bowel/bladder control, bathing, dressing, feeding, grooming, toileting and psychosocial support 5. Can the patient actively participate in an intensive therapy program of at least 3 hrs of therapy per day at least 5 days per week? Yes 6. The potential for patient to make measurable gains while on inpatient rehab is excellent 7. Anticipated functional outcomes upon discharge from inpatient rehab are modified independent  with PT, modified independent with OT, n/a with SLP. 8. Estimated rehab length of stay to reach the above functional goals is: 7-12 days 9. Does the patient have adequate social supports and living environment to accommodate these discharge functional goals? Yes 10. Anticipated D/C setting: Home 11. Anticipated post D/C treatments: HH therapy and Outpatient therapy 12. Overall Rehab/Functional Prognosis: excellent  RECOMMENDATIONS: This patient's condition is appropriate for continued rehabilitative care in the following setting: CIR Patient has agreed to participate in recommended program. Yes Note that insurance prior authorization may be required for reimbursement for recommended care.  Comment: Pt has experienced a large functional decline over the last month due to his low back pain and associated deconditioning. He is motivated and would benefit from an intensive inpatient rehab program to return to an independent level for mobility and self-care. Rehab Admissions Coordinator to follow up.  Thanks,  Meredith Staggers, MD, Mellody Drown    Cathlyn Parsons., PA-C 08/09/2016

## 2016-08-09 NOTE — Progress Notes (Signed)
Patient ID: Troy Combs, male   DOB: Dec 25, 1931, 83 y.o.   MRN: [id number] BP (!) 144/72 (BP Location: Left Arm)   Pulse 74   Temp 98.4 F (36.9 C) (Oral)   Resp 18   Ht 5\' 9"  (1.753 m)   Wt 78.5 kg (173 lb)   SpO2 94%   BMI 25.55 kg/m  Alert and oriented x 4 Speech is clear and fluent Wound is clean and dry Normal movement lower extremities Debilitated, will need placement Continue PT

## 2016-08-09 NOTE — Clinical Social Work Note (Signed)
Clinical Social Work Assessment  Patient Details  Name: Troy Combs MRN: [id number] Date of Birth: Dec 18, 1931  Date of referral:  08/09/16               Reason for consult:  Discharge Planning                Permission sought to share information with:  Family Supports, Facility Sport and exercise psychologist Permission granted to share information::  Yes, Verbal Permission Granted  Name::     Troy Combs  Agency::  SNFs  Relationship::  Wife  Contact Information:  [phone]  Housing/Transportation Living arrangements for the past 2 months:  [place] of Information:  Patient, Spouse Patient Interpreter Needed:  None Criminal Activity/Legal Involvement Pertinent to Current Situation/Hospitalization:  No - Comment as needed Significant Relationships:  Spouse Lives with:  Spouse Do you feel safe going back to the place where you live?  Yes Need for family participation in patient care:  Yes (Comment)  Care giving concerns:  No caregiving concerns identified.    Social Worker assessment / plan:  CSW met with pt to address consult for new SNF as back-up to inpatient rehab. CSW introduced herself and explained role of social work. Wife-Susan Knierim at bedside. Pt from home and lives with wife. P/T is recommending STR at CIR. CSW explained SNF is a back-up if for any reason pt is denied admittance to CIR. CSW explained discharging to SNF with a managed Medicare. CSW provided SNF listing for review. Pt agreeable to SNF for STR, only if CIR denies pt. Pt prefers [facility] or [facility], but agreed for CSW to fax out to [facility].    CSW sent FL-2 to SNFs and will follow up on potential bed offer. CSW will continue to follow.   Employment status:  Retired Science writer) PT Recommendations:  [place] / Referral to community resources:  [place]  Patient/Family's Response to care:   Pt and wife appreciative and thankful for CSW support and guidance with discharge planning.   Patient/Family's Understanding of and Emotional Response to Diagnosis, Current Treatment, and Prognosis:  Pt and wife understand pt will benefit from STR prior to returning home and is accepting of his diagnosis, current treatment plan, and prognosis.    Emotional Assessment Appearance:  Appears stated age Attitude/Demeanor/Rapport:  Other (Appropriate) Affect (typically observed):  Accepting, Adaptable, Pleasant Orientation:  Oriented to Self, Oriented to Place, Oriented to  Time, Oriented to Situation Alcohol / Substance use:  Other Psych involvement (Current and /or in the community):  No (Comment)  Discharge Needs  Concerns to be addressed:  Care Coordination Readmission within the last 30 days:  No Current discharge risk:  Inadequate Financial Supports Barriers to Discharge:  Continued Medical Work up   [employer], LCSW 08/09/2016, 9:57 PM

## 2016-08-10 NOTE — Progress Notes (Signed)
Patient ID: Troy Combs, male   DOB: 08-26-1931, 83 y.o.   MRN: [id number] Subjective: Patient reports he is doing the same. No new back or leg pain.  Objective: Vital signs in last 24 hours: Temp:  [97.7 F (36.5 C)-98.8 F (37.1 C)] 98.8 F (37.1 C) (04/14 0047) Pulse Rate:  [74-85] 85 (04/14 0047) Resp:  [16-20] 16 (04/14 0047) BP: (124-160)/(60-75) 139/62 (04/14 0047) SpO2:  [93 %-98 %] 96 % (04/14 0047)  Intake/Output from previous day: No intake/output data recorded. Intake/Output this shift: No intake/output data recorded.  Neurologic: Grossly normal  Lab Results: Lab Results  Component Value Date   WBC 7.9 08/01/2016   HGB 15.0 08/01/2016   HCT 43.5 08/01/2016   MCV 86.0 08/01/2016   PLT 122 (L) 08/01/2016   Lab Results  Component Value Date   INR 1.2 09/04/2008   BMET Lab Results  Component Value Date   NA 140 08/01/2016   K 4.4 08/01/2016   CL 109 08/01/2016   CO2 27 08/01/2016   GLUCOSE 108 (H) 08/01/2016   BUN 15 08/01/2016   CREATININE 0.90 08/01/2016   CALCIUM 9.3 08/01/2016    Studies/Results: No results found.  Assessment/Plan: Stable, awaiting placement   LOS: 8 days    Deneisha Dade S 08/10/2016, 8:31 AM

## 2016-08-10 NOTE — Progress Notes (Signed)
Physical Therapy Treatment Patient Details Name: Troy Combs MRN: [id number] DOB: [birth year] Today's Date: 08/10/2016    History of Present Illness Pt is an 83 y/o male with a PMH significant for previous back surgery in October 2017. He had a sudden onset of pain and is now s/p decompression of the disc herniation at L4-5. Pt's wife states that he saw a PT at [facility] for BPPV treatment recently. Pt has a past medical history including, Old MI; and hypertension; Atrial ablation surgery; Coronary angioplasty;  Cardiac cath.    PT Comments    This session was limited because pt was orthostatic; Sitting: 152/92  Standing 3 minutes: 84/55  (See vital signs flowsheet for more details).  Pt required min guard for most bed mobility and +2 Mod assist and RW for transfers and ambulating 6 feet bed to chair.  Pt is motivated and will benefit from rehab at the inpatient level.  Will follow acutely.  Follow Up Recommendations  CIR;Supervision/Assistance - 24 hour     Equipment Recommendations  Other (comment) (TBD by next venue of care)    Recommendations for Other Services Rehab consult     Precautions / Restrictions Precautions Precautions: Back;Fall Precaution Booklet Issued: No Precaution Comments: Precautions taught for comfort Restrictions Weight Bearing Restrictions: No    Mobility  Bed Mobility Overal bed mobility: Needs Assistance Bed Mobility: Rolling;Sidelying to Sit Rolling: Min guard Sidelying to sit: Min guard;Min assist       General bed mobility comments: Pt had posterior lean in sitting and required min assist to maintain upright position.  Transfers Overall transfer level: Needs assistance Equipment used: Rolling walker (2 wheeled) Transfers: Sit to/from Stand Sit to Stand: Mod assist;+2 physical assistance         General transfer comment: VC's for hand placement and to shift weight forward.  Strong posterior lean present with standing.  Pt dizzy upon  sitting and standing.  Orthostatics obtained.  Ambulation/Gait Ambulation/Gait assistance: +2 physical assistance;Mod assist Ambulation Distance (Feet): 5 Feet Assistive device: Rolling walker (2 wheeled) Gait Pattern/deviations: Step-through pattern;Decreased stride length;Shuffle;Leaning posteriorly;Trunk flexed;Narrow base of support Gait velocity: Decreased Gait velocity interpretation: Below normal speed for age/gender General Gait Details: Bed to chair since pt was orthostatic.  Strong posterior lead and flexed trunk posture.   Stairs            Wheelchair Mobility    Modified Rankin (Stroke Patients Only)       Balance Overall balance assessment: Needs assistance Sitting-balance support: Bilateral upper extremity supported;Feet supported Sitting balance-Leahy Scale: Poor Sitting balance - Comments: requires assist to sit EOB Postural control: Posterior lean;Right lateral lean Standing balance support: Bilateral upper extremity supported Standing balance-Leahy Scale: Poor Standing balance comment: mod physical assist and VC's required to correct posterior lean.  Pt stands on heels with toes elevated.  Cued to shift weight forward, upright posture, and to keep whole feet planted on floor.                            Cognition Arousal/Alertness: Awake/alert Behavior During Therapy: WFL for tasks assessed/performed Overall Cognitive Status: Difficult to assess Area of Impairment: Safety/judgement;Problem solving                         Safety/Judgement: Decreased awareness of safety;Decreased awareness of deficits   Problem Solving: Slow processing;Decreased initiation;Difficulty sequencing;Requires verbal cues General Comments: Pt requiring verbal and tactile cues  to follow directions.       Exercises      General Comments General comments (skin integrity, edema, etc.): Pt asked [place] when he would start to feel normal again.       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 PT Goal Formulation: With patient/family Time For Goal Achievement: 08/17/16 Potential to Achieve Goals: Good Progress towards PT goals: Progressing toward goals    Frequency    Min 5X/week      PT Plan Current plan remains appropriate    Co-evaluation             End of Session Equipment Utilized During Treatment: Gait belt Activity Tolerance: Treatment limited secondary to medical complications (Comment) (orthostatic hypotension) Patient left: in chair;with call bell/phone within reach;with chair alarm set Nurse Communication: Mobility status PT Visit Diagnosis: Unsteadiness on feet (R26.81);Pain;Ataxic gait (R26.0);Muscle weakness (generalized) (M62.81) Pain - part of body:  (low back)     Time: 0723-0800 PT Time Calculation (min) (ACUTE ONLY): 37 min  Charges:                       G Codes:       Gaetano Net SPT   Gaetano Net 08/10/2016, 11:24 AM

## 2016-08-11 LAB — URINALYSIS, ROUTINE W REFLEX MICROSCOPIC
Bilirubin Urine: NEGATIVE
Glucose, UA: NEGATIVE mg/dL
Hgb urine dipstick: NEGATIVE
Ketones, ur: NEGATIVE mg/dL
Leukocytes, UA: NEGATIVE
Nitrite: NEGATIVE
Protein, ur: NEGATIVE mg/dL
Specific Gravity, Urine: 1.02 (ref 1.005–1.030)
pH: 5 (ref 5.0–8.0)

## 2016-08-11 MED ORDER — NYSTATIN 100000 UNIT/GM EX POWD
Freq: Three times a day (TID) | CUTANEOUS | Status: DC | PRN
  Administered 2016-08-11: 20:00:00 via TOPICAL
  Filled 2016-08-11: qty 15

## 2016-08-11 NOTE — Progress Notes (Signed)
Occupational Therapy Treatment Patient Details Name: Troy Combs MRN: [id number] DOB: Dec 25, 1931 Today's Date: 08/11/2016    History of present illness Pt is an 83 y/o male with a PMH significant for previous back surgery in October 2017. He had a sudden onset of pain and is now s/p decompression of the disc herniation at L4-5. Pt's wife states that he saw a PT at [facility] for BPPV treatment recently. Pt has a past medical history including, Old MI; and hypertension; Atrial ablation surgery; Coronary angioplasty;  Cardiac cath.   OT comments  Pt. Making gains with skilled OT.  Able to complete LB dressing tasks and shower stall transfer.  However, he is still requiring min a during functional mobility secondary to strong posterior lean that he has difficulty correcting.  Pt. Is an excellent CIR therapy candidate as he has excellent motivation and need for higher level intense therapies to ensure safe return home.    Follow Up Recommendations  CIR    Equipment Recommendations  Toilet rise with handles    Recommendations for Other Services      Precautions / Restrictions Precautions Precautions: Back;Fall       Mobility Bed Mobility               General bed mobility comments: pt. seated in recliner at beginning and end of session  Transfers Overall transfer level: Needs assistance Equipment used: Rolling walker (2 wheeled) Transfers: Sit to/from [facility] Sit to Stand: Min assist Stand pivot transfers: Min assist       General transfer comment: VC's for hand placement and to shift weight forward.  Strong posterior lean present with standing, required multiple attemtps before he was able to correct and still could not maintain    Balance                                           ADL either performed or assessed with clinical judgement   ADL Overall ADL's : Needs assistance/impaired                                  Tub/ Shower Transfer: Walk-in shower;Minimal assistance;Cueing for sequencing;Anterior/posterior;Ambulation;Rolling walker;Grab bars   Functional mobility during ADLs: Minimal assistance;Rolling walker General ADL Comments: remains motivated and excited to complete therapy.  addressed pts. concerns and goals for being able to walk his dog charlie again.  he usually walks him 3x a day.  he is a small dog but pt. reports he pulls "a lot".       Vision       Perception     Praxis      Cognition Arousal/Alertness: Awake/alert Behavior During Therapy: WFL for tasks assessed/performed                                            Exercises     Shoulder Instructions       General Comments  pts. main goals and concerns is getting back to his dog charlie and being able to walk him and care for him safely.      Pertinent Vitals/ Pain       Pain Assessment: No/denies pain  Home Living  Prior Functioning/Environment              Frequency  Min 2X/week        Progress Toward Goals  OT Goals(current goals can now be found in the care plan section)  Progress towards OT goals: Progressing toward goals     Plan Discharge plan remains appropriate    Co-evaluation                 End of Session Equipment Utilized During Treatment: Gait belt;Rolling walker  OT Visit Diagnosis: Unsteadiness on feet (R26.81);Muscle weakness (generalized) (M62.81);Other (comment);Pain Pain - Right/Left: Right Pain - part of body: Leg   Activity Tolerance Patient tolerated treatment well   Patient Left in chair;with family/visitor present   Nurse Communication          Time: 0922-0950 OT Time Calculation (min): 28 min  Charges: OT General Charges $OT Visit: 1 Procedure OT Treatments $Self Care/Home Management : 23-37 mins   Janice Coffin, COTA/L 08/11/2016, 10:00 AM

## 2016-08-11 NOTE — Progress Notes (Signed)
Physical Therapy Treatment Patient Details Name: Troy Combs MRN: [id number] DOB: Oct 14, 1931 Today's Date: 08/11/2016    History of Present Illness Pt is an 83 y/o male with a PMH significant for previous back surgery in October 2017. He had a sudden onset of pain and is now s/p decompression of the disc herniation at L4-5. Pt's wife states that he saw a PT at [facility] for BPPV treatment recently. Pt has a past medical history including, Old MI; and hypertension; Atrial ablation surgery; Coronary angioplasty;  Cardiac cath.    PT Comments    Pt showed improvements in transfers and ambulation today, requiring min guard to min assist for power up and stability with walking.  He showed decreased functional mobility tolerance and LOB, requiring a chair follow and several seated and standing rest breaks.  Current recommendations remain appropriate, as pt will benefit from continued skilled rehab at CIR level to address general weakness, instability, decreased safety awareness and decreased endurance. Will continue to follow acutely.    Follow Up Recommendations  CIR;Supervision/Assistance - 24 hour     Equipment Recommendations  Other (comment) (TBD by next venue of care)    Recommendations for Other Services Rehab consult     Precautions / Restrictions Precautions Precautions: Back;Fall Precaution Booklet Issued: No Precaution Comments: Precautions for comfort Restrictions Weight Bearing Restrictions: No    Mobility  Bed Mobility               General bed mobility comments: pt. seated in recliner at beginning and end of session  Transfers Overall transfer level: Needs assistance Equipment used: Rolling walker (2 wheeled) Transfers: Sit to/from Stand Sit to Stand: Min guard;Min assist Stand pivot transfers: Min assist       General transfer comment: Required min guard at start of session, but later in session required min assist because of fatigue.  VC's for hand  placement and upright posture.  Ambulation/Gait Ambulation/Gait assistance: Min guard;Min assist;+2 safety/equipment (chair follow) Ambulation Distance (Feet): 200 Feet (50',seated rest,10',seated rest,50',stand rest,60',rest,30') Assistive device: Rolling walker (2 wheeled) Gait Pattern/deviations: Step-through pattern;Decreased stride length;Leaning posteriorly;Trunk flexed;Narrow base of support Gait velocity: Decreased Gait velocity interpretation: Below normal speed for age/gender General Gait Details: Pt required chair follow and VC's for upright posture and walker proximity.  After first 60 ft, pt states his R leg feels like it is going to "give way" and had LOB posteriorly which required min physical assist to recover.  Following LOB, pt required 30 second seated rest break.  He needed several seated and standing rest breaks throughout ambulation for fatigue and LE weakness.  LOB again with turning around 180 degrees which required min assist to recover.  Slow overall with periods of decreased gait speed with shorter stride length.  Pt cued for quad activation with stance to avoid buckling.   Stairs            Wheelchair Mobility    Modified Rankin (Stroke Patients Only)       Balance Overall balance assessment: Needs assistance Sitting-balance support: Bilateral upper extremity supported;Feet supported Sitting balance-Leahy Scale: Fair   Postural control: Right lateral lean Standing balance support: Bilateral upper extremity supported Standing balance-Leahy Scale: Poor Standing balance comment: Pt states he notices his R lateral lean and is able to correct it with cueing.                            Cognition Arousal/Alertness: Awake/alert Behavior During  Therapy: WFL for tasks assessed/performed Overall Cognitive Status: History of cognitive impairments - at baseline Area of Impairment: Memory;Safety/judgement;Problem solving;Following commands                      Memory: Decreased short-term memory Following Commands: Follows one step commands with increased time Safety/Judgement: Decreased awareness of safety;Decreased awareness of deficits   Problem Solving: Slow processing;Decreased initiation;Difficulty sequencing;Requires verbal cues;Requires tactile cues        Exercises      General Comments        Pertinent Vitals/Pain Pain Assessment: Faces Faces Pain Scale: Hurts a little bit Pain Location: Incision site/back Pain Descriptors / Indicators: Sore;Operative site guarding Pain Intervention(s): Monitored during session;Repositioned    Home Living                      Prior Function            PT Goals (current goals can now be found in the care plan section) Acute Rehab PT Goals Patient 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 PT Goal Formulation: With patient/family Time For Goal Achievement: 08/17/16 Potential to Achieve Goals: Good Progress towards PT goals: Progressing toward goals    Frequency    Min 5X/week      PT Plan Current plan remains appropriate    Co-evaluation             End of Session Equipment Utilized During Treatment: Gait belt Activity Tolerance: Patient tolerated treatment well Patient left: in chair;with call bell/phone within reach;with chair alarm set;with family/visitor present (wife present) Nurse Communication: Mobility status PT Visit Diagnosis: Unsteadiness on feet (R26.81);Pain;Ataxic gait (R26.0);Muscle weakness (generalized) (M62.81) Pain - part of body:  (back)     Time: 5830-9407 PT Time Calculation (min) (ACUTE ONLY): 37 min  Charges:  $Gait Training: 23-37 mins                    G Codes:       Gaetano Net SPT   Gaetano Net 08/11/2016, 12:46 PM

## 2016-08-11 NOTE — Progress Notes (Signed)
Overall doing reasonably well. Back pain controlled. Did have 1 episode of incontinence overnight. Denies any new numbness tingling or weakness. No wound issues.  Afebrile. Vitals are stable. Awake and alert. Oriented and appropriate. Bladder scan with low volume. Wound clean and dry. Chest abdomen benign.  Status post lumbar decompressive surgery. Continue efforts at mobilization. Discharge to skilled nursing neck suite.

## 2016-08-12 MED ORDER — TRAMADOL HCL 50 MG PO TABS: 50.0000 mg | ORAL_TABLET | Freq: Four times a day (QID) | ORAL | 0 refills | Status: DC | PRN

## 2016-08-12 NOTE — Discharge Instructions (Signed)
Lumbar Discectomy °Care After °A discectomy involves removal of discmaterial (the cartilage-like structures located between the bones of the back). It is done to relieve pressure on nerve roots. It can be used as a treatment for a back problem. The time in surgery depends on the findings in surgery and what is necessary to correct the problems. °HOME CARE INSTRUCTIONS  °· Check the cut (incision) made by the surgeon twice a day for signs of infection. Some signs of infection may include:  °· A foul smelling, greenish or yellowish discharge from the wound.  °· Increased pain.  °· Increased redness over the incision (operative) site.  °· The skin edges may separate.  °· Flu-like symptoms (problems).  °· A temperature above 101.5° F (38.6° C).  °· Change your bandages in about 24 to 36 hours following surgery or as directed.  °· You may shower tomrrow.  Avoid bathtubs, swimming pools and hot tubs for three weeks or until your incision has healed completely. °· Follow your doctor's instructions as to safe activities, exercises, and physical therapy.  °· Weight reduction may be beneficial if you are overweight.  °· Daily exercise is helpful to prevent the return of problems. Walking is permitted. You may use a treadmill without an incline. Cut down on activities and exercise if you have discomfort. You may also go up and down stairs as much as you can tolerate.  °· DO NOT lift anything heavier than 10 to 15 lbs. Avoid bending or twisting at the waist. Always bend your knees when lifting.  °· Maintain strength and range of motion as instructed.  °· Do not drive for 10 days, or as directed by your doctors. You may be a passenger . Lying back in the passenger seat may be more comfortable for you. Always wear a seatbelt.  °· Limit your sitting in a regular chair to 20 to 30 minutes at a time. There are no limitations for sitting in a recliner. You should lie down or walk in between sitting periods.  °· Only take  over-the-counter or prescription medicines for pain, discomfort, or fever as directed by your caregiver.  °SEEK MEDICAL CARE IF:  °· There is increased bleeding (more than a small spot) from the wound.  °· You notice redness, swelling, or increasing pain in the wound.  °· Pus is coming from wound.  °· You develop an unexplained oral temperature above 102° F (38.9° C) develops.  °· You notice a foul smell coming from the wound or dressing.  °· You have increasing pain in your wound.  °SEEK IMMEDIATE MEDICAL CARE IF:  °· You develop a rash.  °· You have difficulty breathing.  °· You develop any allergic problems to medicines given.  °Document Released: 03/20/2004 Document Revised: 04/04/2011 Document Reviewed: 07/09/2007 °ExitCare® Patient Information °

## 2016-08-12 NOTE — Progress Notes (Signed)
Patient taken to [facility] via [place], belongings with patient including discharge packet. IV already d/c'd via dayshift RN.  Update given to pt wife over phone.

## 2016-08-12 NOTE — Care Management Note (Signed)
Case Management Note  Patient Details  Name: Troy Combs MRN: [id number] Date of Birth: April 16, 1932  Subjective/Objective:                    Action/Plan: Plan is for [facility] SNF at d/c. CM following.  Expected Discharge Date:                  Expected Discharge Plan:  Skilled Nursing Facility  In-House Referral:  Clinical Social Work  Discharge planning Services     Post Acute Care Choice:    Choice offered to:     DME Arranged:    DME Agency:     HH Arranged:    Mutual Agency:     Status of Service:  In process, will continue to follow  If discussed at Long Length of Stay Meetings, dates discussed:    Additional Comments: Have called Dr Lacy Duverney office twice, neuro OR and Neuro ICU to see if patient can d/c to SNF today.  Pollie Friar, RN 08/12/2016, 3:20 PM

## 2016-08-12 NOTE — Clinical Social Work Placement (Signed)
   CLINICAL SOCIAL WORK PLACEMENT  NOTE  Date:  08/12/2016  Patient Details  Name: Troy Combs MRN: [id number] Date of Birth: [birth year]  Clinical Social Work is seeking post-discharge placement for this patient at the [place] level of care (*CSW will initial, date and re-position this form in  chart as items are completed):  Yes   Patient/family provided with [facility] Work Department's list of facilities offering this level of care within the geographic area requested by the patient (or if unable, by the patient's family).  Yes   Patient/family informed of their freedom to choose among providers that offer the needed level of care, that participate in Medicare, Medicaid or managed care program needed by the patient, have an available bed and are willing to accept the patient.  Yes   Patient/family informed of [facility]'s ownership interest in [facility] and [facility], as well as of the fact that they are under no obligation to receive care at these facilities.  PASRR submitted to EDS on 08/09/16     PASRR number received on 08/09/16     Existing PASRR number confirmed on       FL2 transmitted to all facilities in geographic area requested by pt/family on 08/09/16     FL2 transmitted to all facilities within larger geographic area on       Patient informed that his/her managed care company has contracts with or will negotiate with certain facilities, including the following:        Yes   Patient/family informed of bed offers received.  Patient chooses bed at [facility]     Physician recommends and patient chooses bed at      Patient to be transferred to [facility] on 08/12/16.  Patient to be transferred to facility by [facility]     Patient family notified on 08/12/16 of transfer.  Name of family member notified:  Gianpaolo Mindel     PHYSICIAN Please prepare prescriptions     Additional Comment:     _______________________________________________ Candie Chroman, LCSW 08/12/2016, 4:49 PM

## 2016-08-12 NOTE — Progress Notes (Signed)
Rehab admissions - I have received a denial from insurance carrier for acute inpatient rehab admission.  They state that patient does not need a rehab nurse or rehab md to monitor his care and that his needs can be met at a lower level of care such as a SNF.  Case Freight forwarder and social worker aware.  I have informed the patient and his wife.  Call me for questions.  #[phone]

## 2016-08-12 NOTE — Care Management Note (Signed)
Case Management Note  Patient Details  Name: Troy Combs MRN: [id number] Date of Birth: 1931-09-06  Subjective/Objective:                    Action/Plan: Pt discharging to [facility] today. No further needs per CM.  Expected Discharge Date:  08/12/16               Expected Discharge Plan:  Skilled Nursing Facility  In-House Referral:  Clinical Social Work  Discharge planning Services     Post Acute Care Choice:    Choice offered to:     DME Arranged:    DME Agency:     HH Arranged:    HH Agency:     Status of Service:  In process, will continue to follow  If discussed at Long Length of Stay Meetings, dates discussed:    Additional Comments:  Pollie Friar, RN 08/12/2016, 4:46 PM

## 2016-08-12 NOTE — Clinical Social Work Note (Signed)
Insurance denial on CIR. CSW met with patient and his wife to discuss SNF options. CSW provided bed offers. They choose [facility]. Admissions coordinator notified and they can take him today. CSW notified RNCM who will let MD know to start discharge orders and paperwork.  Dayton Scrape, [place]

## 2016-08-12 NOTE — Progress Notes (Signed)
Physical Therapy Treatment Patient Details Name: Troy Combs MRN: [id number] DOB: 03-25-1932 Today's Date: 08/12/2016    History of Present Illness Pt is an 83 y/o male with a PMH significant for previous back surgery in October 2017. He had a sudden onset of pain and is now s/p decompression of the disc herniation at L4-5. Pt's wife states that he saw a PT at [facility] for BPPV treatment recently. Pt has a past medical history including, Old MI; and hypertension; Atrial ablation surgery; Coronary angioplasty;  Cardiac cath.    PT Comments    Pt continues to make steady progress toward goals. Acute PT to continue during pt's hospital stay.   Follow Up Recommendations  CIR;Supervision/Assistance - 24 hour     Equipment Recommendations  Other (comment) (TBD at next venue of care)    Recommendations for Other Services Rehab consult     Precautions / Restrictions Precautions Precautions: Back;Fall Precaution Comments: Precautions for comfort Restrictions Weight Bearing Restrictions: No    Mobility  Bed Mobility Overal bed mobility: Needs Assistance Bed Mobility: Rolling;Sidelying to Sit Rolling: Min guard Sidelying to sit: Min assist       General bed mobility comments: cues for technique to roll onto left side (the way he exits bed at home) and cues/assist to come up into sitting at edge of bed.  Transfers Overall transfer level: Needs assistance Equipment used: Rolling walker (2 wheeled) Transfers: Sit to/from Stand Sit to Stand: Min assist;Min guard         General transfer comment: min guard assist from bed x1, min assist from chair x1. min guard assist to sit to chair x2 reps. cues for use of UE's to assist and for controlled descent with sitting down.   Ambulation/Gait Ambulation/Gait assistance: Min assist Ambulation Distance (Feet): 30 Feet (x2 reps) Assistive device: Rolling walker (2 wheeled) Gait Pattern/deviations: Step-through pattern;Decreased stride  length;Narrow base of support;Trunk flexed;Leaning posteriorly Gait velocity: Decreased Gait velocity interpretation: Below normal speed for age/gender General Gait Details: today's session focused on short house hold distances with pt performing 180 degree turns for sitting down. cues/assistance needed for posture, step length and walker use/negotiation       Cognition Arousal/Alertness: Awake/alert Behavior During Therapy: WFL for tasks assessed/performed Overall Cognitive Status: History of cognitive impairments - at baseline Area of Impairment: Safety/judgement;Problem solving;Memory                     Memory: Decreased short-term memory Following Commands: Follows one step commands consistently;Follows multi-step commands with increased time       General Comments: Pt requiring verbal and tactile cues to follow directions.        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 PT Goal Formulation: With patient/family Time For Goal Achievement: 08/17/16 Potential to Achieve Goals: Good Progress towards PT goals: Progressing toward goals    Frequency    Min 5X/week      PT Plan Current plan remains appropriate    End of Session Equipment Utilized During Treatment: Gait belt Activity Tolerance: Patient tolerated treatment well Patient left: in chair;with call bell/phone within reach;with family/visitor present Nurse Communication: Mobility status PT  Visit Diagnosis: Unsteadiness on feet (R26.81);Other abnormalities of gait and mobility (R26.89);Muscle weakness  (generalized) (M62.81);Difficulty in walking, not elsewhere classified (R26.2);Pain Pain - part of body:  (back)     Time: 1610-9604 PT Time Calculation (min) (ACUTE ONLY): 23 min  Charges:  $Gait Training: 8-22 mins $Therapeutic Activity: 8-22 mins                    Willow Ora, PTA, [facility] Acute [employer] Office- [phone] 08/12/16, 12:45 PM  Willow Ora 08/12/2016, 12:44 PM

## 2016-08-12 NOTE — Clinical Social Work Note (Signed)
CSW facilitated patient discharge including contacting patient family and facility to confirm patient discharge plans. Clinical information faxed to facility and family agreeable with plan. CSW arranged ambulance transport via [facility] to [facility]. RN to call report prior to discharge ([phone]).  CSW will sign off for now as social work intervention is no longer needed. Please consult us again if new needs arise.  Ryott Rafferty, CSW [phone]  

## 2016-08-12 NOTE — Care Management Important Message (Signed)
Important Message  Patient Details  Name: Troy Combs MRN: [id number] Date of Birth: Jan 02, 1932   Medicare Important Message Given:  Yes    Orbie Pyo 08/12/2016, 2:08 PM

## 2016-08-12 NOTE — Discharge Summary (Signed)
Physician Discharge Summary  Patient ID: Troy Combs MRN: [id number] DOB/AGE: 81-Oct-1933 81 y.o.  Admit date: 08/02/2016 Discharge date: 08/12/2016  Admission Diagnoses:HNP lumbar  Discharge Diagnoses:  Active Problems:   HNP (herniated nucleus pulposus), lumbar   Discharged Condition: good  Hospital Course: Troy Combs was admitted and taken to the operating room for a redo disectomy at L4/5 on the right side. He had a durotomy while retracting the thecal sac. He was patched and kept on bedrest. He has not had evidence of a leak through the skin. His wound is clean, dry, and without signs of infection. He is voiding, and ambulating without difficulty. He is eating normally.  Treatments: surgery: MICRODISCECTOMY LUMBAR FOUR- LUMBAR FIVE RIGHT     Discharge Exam: Blood pressure 116/75, pulse 84, temperature 97.7 F (36.5 C), temperature source Oral, resp. rate 19, height 5\' 9"  (1.753 m), weight 78.5 kg (173 lb), SpO2 99 %. General appearance: alert, cooperative, appears stated age and no distress Neurologic: Alert and oriented X 3, normal strength and tone. Normal symmetric reflexes. Normal coordination and gait  Disposition: 01-Home or Self Care WOUND INFECTION, LUMBAR  Allergies as of 08/12/2016      Reactions   Iohexol Other (See Comments)    Code: HIVES, Desc: PATENT STATES HE IS ALLERGIC TO IV DYE 09/14/08/RM, Onset Date: [id number]   Ciprofloxacin Hives, Rash   "Patient unsure" just know he has a reaction to med Medical record indicates allergic reaction   Other    UNSPECIFIED REACTION  Angioplasty dye pt.>> Pt only recalls Had "very bad reaction"      Medication List    TAKE these medications   albuterol 108 (90 Base) MCG/ACT inhaler Commonly known as:  PROVENTIL HFA;VENTOLIN HFA Inhale 2 puffs into the lungs every 6 (six) hours as needed for wheezing or shortness of breath.   aspirin EC 81 MG tablet Take 81 mg by mouth at bedtime.   carvedilol 3.125 MG  tablet Commonly known as:  COREG Take 1 tablet (3.125 mg total) by mouth at bedtime.   finasteride 5 MG tablet Commonly known as:  PROSCAR Take 5 mg by mouth at bedtime.   nitroGLYCERIN 0.4 MG SL tablet Commonly known as:  NITROSTAT Place 1 tablet (0.4 mg total) under the tongue every 5 (five) minutes as needed for chest pain (x 3 doses). Reported on 06/15/2015   nortriptyline 25 MG capsule Commonly known as:  PAMELOR Take 1 capsule (25 mg total) by mouth at bedtime.   omeprazole 20 MG capsule Commonly known as:  PRILOSEC Take 20 mg by mouth at bedtime.   rosuvastatin 10 MG tablet Commonly known as:  CRESTOR Take 10 mg by mouth at bedtime.   sertraline 50 MG tablet Commonly known as:  ZOLOFT TAKE 1 TABLET DAILY What changed:  See the new instructions.   tamsulosin 0.4 MG Caps capsule Commonly known as:  FLOMAX TAKE 1 CAPSULE DAILY What changed:  See the new instructions.   traMADol 50 MG tablet Commonly known as:  ULTRAM Take 50 mg by mouth every 6 (six) hours as needed for pain. What changed:  Another medication with the same name was added. Make sure you understand how and when to take each.   traMADol 50 MG tablet Commonly known as:  ULTRAM Take 1 tablet (50 mg total) by mouth every 6 (six) hours as needed. What changed:  You were already taking a medication with the same name, and this prescription was added. Make sure you  understand how and when to take each.       Contact information for follow-up providers    Abdulla Pooley L, MD Follow up in 1 week(s).   Specialty:  Neurosurgery Why:  for suture removal Contact information: [address] [place] [address] [phone]            Contact information for after-discharge care    Destination    [facility] Follow up.   Specialty:  [place] information: [address] [place] [phone]                   Signed: Winfield Cunas 08/12/2016, 4:25 PM

## 2016-08-23 ENCOUNTER — Telehealth: Payer: Self-pay | Admitting: Cardiology

## 2016-08-23 ENCOUNTER — Encounter: Payer: Self-pay | Admitting: Physician Assistant

## 2016-08-23 NOTE — Telephone Encounter (Signed)
Wife reports that the pt has no other cardiac complaints.  Scheduled the pt an appt for next Monday 4/30 at 2:30 pm to see Bonney Leitz PA-C.  Advised the wife that in the meantime, if the pt is attending PT/OT during the weekend, they should be mindful of his fluid intake and increase this during his sessions.  Advised the pts wife that he should increase his fluid intake to maintain his BP.  Will route this message to Dr [name] as an Juluis Rainier, to make her aware of current plan and adding pt to be seen on 4/30 with [facility].  Wife verbalized understanding and agrees with this plan.

## 2016-08-23 NOTE — Progress Notes (Signed)
Cardiology Office Note    Date:  08/26/2016   ID:  Troy Combs, DOB Sep 03, 1931, MRN [id number]  PCP:  [name], MD  Cardiologist:  Dr. [name]  CC: orthostatic hypotension  History of Present Illness:  Troy Combs is a 83 y.o. male with a history of CAD s/p CABG x4V (2002), HTN, atrial flutter, known orthostatic hypotension who presents to clinic for evaluation of worsening orthostatic hypotension.   The last cath in 5/10 with patent L-LAD, S-RCA, S-OM1/OM2, EF 60%.   In the past his Toprol XL was increased to 100 mg PO daily for symptomatic PVCs, but later decreased to 50 mg po daily for fatigue and dizziness. He was later switched to Coreg with improvement in symptoms (fatigue).  He has been known to have orthostatic hypotension but doesn't comply with salt and fluid liberalization. He doesn't like water and drinks a lot of coffee. He has complained of exertional dizziness in the past.   He was sen by Dr. [name] in 2016. He complained of worsening DOE. Exercise Myoview stress test was low risk but did show chronotropic incompetence and it was switched to lexiscan. 2D ECHO at that time showed vigorous LV function (EF 65-70%), G1DD and mild bileaflet MVP.  48 hour holter monitor in 04/2015 showed very infrequent PVCS and PACs.   Seen back by Dr. [name] in 04/2016 and having worsening DOE. Another stress test was ordered which was low risk.  He was admitted 4/6-4/16/18 for redo disectomy at L4/5 on the right side. He had a durotomy while retracting the thecal sac. He was patched and kept on bedrest. He was discharged to [facility] for rehab. Over the past couple of PT/OT sessions at the nursing home, after the pt is getting towards the end of his therapy session, the Therapist notes that he becomes orthostatic hypotensive. Wife reports that the pts last 2 BP recordings from PT was 92/60 and 88/68.  Today he presents to clinic for evaluation of orthostatic hypotension. He  has always had this to some degree but this has been much worse. He can't get up to get to the door without feeling like he is going to pass out. He denies chest pain. Does get dyspnea on exertion with walking the dog. He has not been out walking given back surgery. He was discharged from rehab today. When working with PT he would get really dizzy and they notice BP was dropping. He does not eat a lot of salt or fluids. He does not like water. He does not wear compression stockings but has some at home.      Past Medical History:  Diagnosis Date  . Allergic rhinitis 09/20/2014  . Anemia, unspecified   . Arthritis   . Bladder neck obstruction   . Cataracts, bilateral   . Coronary atherosclerosis of unspecified type of vessel, native or graft   . Diminished hearing, bilateral   . Dysthymic disorder   . GERD (gastroesophageal reflux disease)   . Heart murmur    as a child  . Impaired glucose tolerance 12/02/2012  . Impotence of organic origin   . Lumbar radiculopathy   . Malignant melanoma of skin of upper limb, including shoulder ([place])   . Old myocardial infarction   . Personal history of malignant melanoma of skin   . Personal history of other diseases of digestive system   . Thoracic or lumbosacral neuritis or radiculitis, unspecified   . Unspecified essential hypertension  Past Surgical History:  Procedure Laterality Date  . ARTHRODESIS  07/06/2002   of left long finger distal interphalangeal joint with Kirschner wire fixation X 3  . ATRIAL ABLATION SURGERY  05/04/2001   Dr. [name]  . CARDIAC CATHETERIZATION  09/05/2008   Revealing 4 of 4 patent grafts with native multivessel coronary artery disease, EF  of 60% without regional wall motion abnormalities.  Marland Kitchen CATARACT EXTRACTION, BILATERAL    . CHOLECYSTECTOMY    . COLONOSCOPY    . CORONARY ANGIOPLASTY  1993  . CORONARY ARTERY BYPASS GRAFT  10/17/2000   [name]. [name], [place]     AFTER LIVER  TRAUMA AND HAND SURGERY  . EYE SURGERY    . HERNIA REPAIR     BILATERAL  . LUMBAR LAMINECTOMY/DECOMPRESSION MICRODISCECTOMY N/A 02/02/2016   Procedure: MICRODISCECTOMY LUMBAR FOUR- LUMBAR FIVE;  Surgeon: Ashok Pall, MD;  Location: [facility];  Service: Neurosurgery;  Laterality: N/A;  MICRODISCECTOMY L4-L5  . LUMBAR LAMINECTOMY/DECOMPRESSION MICRODISCECTOMY Right 08/02/2016   Procedure: MICRODISCECTOMY LUMBAR FOUR- LUMBAR FIVE RIGHT;  Surgeon: Ashok Pall, MD;  Location: [facility];  Service: Neurosurgery;  Laterality: Right;  . punctured eardrum      to relieve blood build up   for ear infection  . TONSILLECTOMY      Current Medications: Outpatient Medications Prior to Visit  Medication Sig Dispense Refill  . albuterol (PROVENTIL HFA;VENTOLIN HFA) 108 (90 BASE) MCG/ACT inhaler Inhale 2 puffs into the lungs every 6 (six) hours as needed for wheezing or shortness of breath. 1 Inhaler 11  . aspirin EC 81 MG tablet Take 81 mg by mouth at bedtime.     . finasteride (PROSCAR) 5 MG tablet Take 5 mg by mouth at bedtime.     . nitroGLYCERIN (NITROSTAT) 0.4 MG SL tablet Place 1 tablet (0.4 mg total) under the tongue every 5 (five) minutes as needed for chest pain (x 3 doses). Reported on 06/15/2015 25 tablet 6  . nortriptyline (PAMELOR) 25 MG capsule Take 1 capsule (25 mg total) by mouth at bedtime. 90 capsule 2  . omeprazole (PRILOSEC) 20 MG capsule Take 20 mg by mouth at bedtime.    . rosuvastatin (CRESTOR) 10 MG tablet Take 10 mg by mouth at bedtime.     . sertraline (ZOLOFT) 50 MG tablet TAKE 1 TABLET DAILY (Patient taking differently: TAKE 1 TABLET DAILY IN THE EVENING) 90 tablet 3  . tamsulosin (FLOMAX) 0.4 MG CAPS capsule TAKE 1 CAPSULE DAILY (Patient taking differently: TAKE 1 CAPSULE AT BEDTIME.) 90 capsule 1  . traMADol (ULTRAM) 50 MG tablet Take 50 mg by mouth every 6 (six) hours as needed for pain.    . carvedilol (COREG) 3.125 MG tablet Take 1 tablet (3.125 mg total) by mouth at bedtime. 90 tablet  1  . traMADol (ULTRAM) 50 MG tablet Take 1 tablet (50 mg total) by mouth every 6 (six) hours as needed. (Patient not taking: Reported on 08/26/2016) 28 tablet 0   No facility-administered medications prior to visit.      Allergies:   Iohexol; Ciprofloxacin; and Other   Social History   Social History  . Marital status: Married    Spouse name: N/A  . Number of children: 5  . Years of education: N/A   Social History Main Topics  . Smoking status: Former Smoker    Quit date: 09/21/1955  . Smokeless tobacco: Never Used  . Alcohol use 0.0 oz/week     Comment: one or  two ounces a week  . Drug use: No  . Sexual activity: Not Asked   Other Topics Concern  . None   Social History Narrative   Lives with wife in a one story home.  Has 5 children.  Retired Freight forwarder for [employer].  Education: 2 years of college.      Family History:  The patient's family history includes Heart disease in his father.     ROS:   Please see the history of present illness.    ROS All other systems reviewed and are negative.   PHYSICAL EXAM:   VS:  BP 136/75   Pulse 93   Ht 5\' 9"  (1.753 m)   Wt 168 lb (76.2 kg)   BMI 24.81 kg/m    GEN: Well nourished, well developed, in no acute distress  HEENT: normal  Neck: no JVD, carotid bruits, or masses Cardiac: RRR; no murmurs, rubs, or gallops,no edema  Respiratory:  clear to auscultation bilaterally, normal work of breathing GI: soft, nontender, nondistended, + BS MS: no deformity or atrophy  Skin: warm and dry, no rash Neuro:  Alert and Oriented x 3, Strength and sensation are intact Psych: euthymic mood, full affect   Wt Readings from Last 3 Encounters:  08/26/16 168 lb (76.2 kg)  08/02/16 173 lb (78.5 kg)  08/01/16 173 lb 3 oz (78.6 kg)      Studies/Labs Reviewed:   EKG:  EKG is NOT ordered today.    Recent Labs: 01/24/2016: TSH 3.15 08/01/2016: ALT 23; BUN 15; Creatinine, Ser 0.90; Hemoglobin 15.0; Platelets 122; Potassium 4.4; Sodium 140    Lipid Panel    Component Value Date/Time   CHOL 110 01/24/2016 0955   TRIG 133.0 01/24/2016 0955   TRIG 83 03/18/2006 0839   HDL 49.70 01/24/2016 0955   CHOLHDL 2 01/24/2016 0955   VLDL 26.6 01/24/2016 0955   LDLCALC 34 01/24/2016 0955    Additional studies/ records that were reviewed today include:  Echo: 08/16/2014 Study Conclusions - Left ventricle: The cavity size was normal. Wall thickness was increased in a pattern of mild LVH. There was mild focal basal hypertrophy of the septum. Systolic function was vigorous. The estimated ejection fraction was in the range of 65% to 70%. Wall motion was normal; there were no regional wall motion abnormalities. Doppler parameters are consistent with abnormal left ventricular relaxation (grade 1 diastolic dysfunction). - Mitral valve: Calcified annulus. Mild prolapse, involving the anterior leaflet and the posterior leaflet. Impressions: - Vigorous LV function; grade 1 diastolic dysfunction; mild bileaflet MVP; trace MR and TR.  Lexiscan stress test: 06/2014 Impression Exercise Capacity: Lexiscan with low level exercise. BP Response: Normal blood pressure response. Clinical Symptoms: No chest pain. ECG Impression: No significant ST segment change suggestive of ischemia. Comparison with Prior Nuclear Study:Previous report was low risk scan  Overall Impression: Probable normal perfusion and minimal apical thnning. No evidence for significant ischemia or scar. Overall low risk scan  LV Ejection Fraction: 66%. LV Wall Motion: NL LV Function; NL Wall Motion   Lesxiscan Myoview 05/07/16 Study Highlights   Nuclear stress EF: 56%.  There was no ST segment deviation noted during stress.  The study is normal.  This is a low risk study.  The left ventricular ejection fraction is normal (55-65%).      48 hour holter monitor 04/2015 Study Highlights  Very frequent monomorphic PVCs - 16.700 in 48 hours.  Some couplets.  Infrequent PACs.   Very frequent monomorphic PVCs - 16.700  in 48 hours.     ASSESSMENT & PLAN:   Symptomatic orthostatic hypotension: he has a long history of this, but it has been much worse recently. Orthostatic BPs in the office today are quite dramatic with SBP dropping from 143--> 90 from sitting to standing. He is quite symptomatic and it is interfering with his physical rehabilitation. He only takes Coreg 3.125mg  at night. I have asked him to stop this. I have recommended that he wear compression stockings and liberalize salt and fluids. I will also start him on Florinef 0.1mg  daily. I have recommended that he follow up with [name] as well.   HTN: BP well controlled.  I have asked him to stop his Coreg 3.125mg  once daily at night. Will have to watch BP off antihypertensives.  CAD s/p CABG: recent nuclear stress test in 04/2016 low risk for ischemia. No chest pain.  Continue ASA, statin. No BB given orthostasis.   PVCs: stable.   HLD: continue statin .    Medication Adjustments/Labs and Tests Ordered: Current medicines are reviewed at length with the patient today.  Concerns regarding medicines are outlined above.  Medication changes, Labs and Tests ordered today are listed in the Patient Instructions below. Patient Instructions  Medication Instructions:  Your physician has recommended you make the following change in your medication:  1.  STOP the Carvedilol 2.  START Florinef 0.1 mg taking 1 tablet daily   Labwork: None ordered  Testing/Procedures: None ordered  Follow-Up: Your physician recommends that you schedule a follow-up appointment in: [place] [facility]   Any Other Special Instructions Will Be Listed Below (If Applicable).  Please wear your compression stockings  If you need a refill on your cardiac medications before your next appointment, please call your pharmacy.      Signed, Angelena Form, PA-C  08/26/2016 7:04 PM    [facility] [address], [place], [place]  [address] Phone: [phone]; Fax: [phone]

## 2016-08-23 NOTE — Telephone Encounter (Signed)
Pts wife calling to report that the pt will be discharged from [facility] on Monday morning, for he was there for rehab from recent back surgery.  Per the pts wife, over the past couple of PT/OT sessions at the nursing home, after the pt is getting towards the end of his therapy session, the Therapist notes that he becomes orthostatic hypotensive.  Wife reports that the pts last 2 BP recordings from PT was 92/60 and 88/68.  Wife reports that [facility] Physician advised that he get in to see Cardiology for this, to be on the safe side and make sure its not cardiac related.  Wife reports his BP only drops at therapy.  Wife reports the pt has no other cardiac issues.

## 2016-08-23 NOTE — Telephone Encounter (Signed)
New Message     bp is dropping during rehab , and it is a significant drop when pt has exertion, he is getting dizzy and Dr Teodoro Spray wants him to be seen   Pt has appt with dunn 08/27/16 1145a

## 2016-08-26 ENCOUNTER — Ambulatory Visit (INDEPENDENT_AMBULATORY_CARE_PROVIDER_SITE_OTHER): Payer: Medicare Other | Admitting: Physician Assistant

## 2016-08-26 ENCOUNTER — Encounter: Payer: Self-pay | Admitting: Physician Assistant

## 2016-08-26 VITALS — BP 136/75 | HR 93 | Ht 69.0 in | Wt 168.0 lb

## 2016-08-26 DIAGNOSIS — Z951 Presence of aortocoronary bypass graft: Secondary | ICD-10-CM | POA: Diagnosis not present

## 2016-08-26 DIAGNOSIS — I951 Orthostatic hypotension: Secondary | ICD-10-CM

## 2016-08-26 DIAGNOSIS — I493 Ventricular premature depolarization: Secondary | ICD-10-CM | POA: Diagnosis not present

## 2016-08-26 DIAGNOSIS — E782 Mixed hyperlipidemia: Secondary | ICD-10-CM

## 2016-08-26 DIAGNOSIS — R42 Dizziness and giddiness: Secondary | ICD-10-CM | POA: Diagnosis not present

## 2016-08-26 MED ORDER — FLUDROCORTISONE ACETATE 0.1 MG PO TABS: 0.1000 mg | ORAL_TABLET | Freq: Every day | ORAL | 1 refills | Status: DC

## 2016-08-26 NOTE — Patient Instructions (Addendum)
Medication Instructions:  Your physician has recommended you make the following change in your medication:  1.  STOP the Carvedilol 2.  START Florinef 0.1 mg taking 1 tablet daily   Labwork: None ordered  Testing/Procedures: None ordered  Follow-Up: Your physician recommends that you schedule a follow-up appointment in: [place] [facility]YPERTENSION   Any Other Special Instructions Will Be Listed Below (If Applicable).  Please wear your compression stockings  If you need a refill on your cardiac medications before your next appointment, please call your pharmacy.

## 2016-08-27 ENCOUNTER — Ambulatory Visit: Payer: Medicare Other | Admitting: Physician Assistant

## 2016-08-28 ENCOUNTER — Telehealth: Payer: Self-pay | Admitting: Internal Medicine

## 2016-08-28 NOTE — Telephone Encounter (Signed)
Needs verbals for physical therapy  3x5 2x3   Ok to LVM on confidential line

## 2016-08-28 NOTE — Telephone Encounter (Signed)
Ok for verbals 

## 2016-08-28 NOTE — Telephone Encounter (Signed)
Verbal orders have been given to Caitlyn.

## 2016-08-28 NOTE — Telephone Encounter (Signed)
Caitlin called back and asked for approval for an aid to assist the pt 2 times a week for 4 weeks to help with bathing and make sure that he does not fall. She does not feel confident that the wife will be able to help him in and out of the shower safely.

## 2016-09-05 ENCOUNTER — Ambulatory Visit (INDEPENDENT_AMBULATORY_CARE_PROVIDER_SITE_OTHER): Payer: Medicare Other | Admitting: Internal Medicine

## 2016-09-05 ENCOUNTER — Encounter: Payer: Self-pay | Admitting: Internal Medicine

## 2016-09-05 VITALS — BP 150/82 | HR 94 | Ht 69.0 in | Wt 170.0 lb

## 2016-09-05 DIAGNOSIS — Z0001 Encounter for general adult medical examination with abnormal findings: Secondary | ICD-10-CM

## 2016-09-05 DIAGNOSIS — I10 Essential (primary) hypertension: Secondary | ICD-10-CM | POA: Diagnosis not present

## 2016-09-05 DIAGNOSIS — Z0000 Encounter for general adult medical examination without abnormal findings: Secondary | ICD-10-CM

## 2016-09-05 DIAGNOSIS — R7303 Prediabetes: Secondary | ICD-10-CM | POA: Diagnosis not present

## 2016-09-05 LAB — POCT GLYCOSYLATED HEMOGLOBIN (HGB A1C): Hemoglobin A1C: 5.7

## 2016-09-05 NOTE — Progress Notes (Signed)
Subjective:    Patient ID: Troy Combs, male    DOB: Oct 28, 1931, 83 y.o.   MRN: [id number]  HPI  Here for wellness and f/u;  Overall doing ok;  Pt denies Chest pain, worsening SOB, DOE, wheezing, orthopnea, PND, worsening LE edema, palpitations, dizziness or syncope.  Pt denies neurological change such as new headache, facial or extremity weakness.  Pt denies polydipsia, polyuria, or low sugar symptoms. Pt states overall good compliance with treatment and medications, good tolerability, and has been trying to follow appropriate diet.  Pt denies worsening depressive symptoms, suicidal ideation or panic. No fever, night sweats, wt loss, loss of appetite, or other constitutional symptoms.  Pt states good ability with ADL's, has low fall risk, home safety reviewed and adequate, no other significant changes in hearing or vision  Here after recent lumbar surgury 4/6, Pt with much improved LBP without change in severity, bowel or bladder change, fever, wt loss,  worsening LE pain/numbness/weakness, gait change and PT helping, no recent falls. Pt denies fever, wt loss, night sweats, loss of appetite, or other constitutional symptoms Past Medical History:  Diagnosis Date  . Allergic rhinitis 09/20/2014  . Anemia, unspecified   . Arthritis   . Bladder neck obstruction   . Cataracts, bilateral   . Coronary atherosclerosis of unspecified type of vessel, native or graft   . Diminished hearing, bilateral   . Dysthymic disorder   . GERD (gastroesophageal reflux disease)   . Heart murmur    as a child  . Impaired glucose tolerance 12/02/2012  . Impotence of organic origin   . Lumbar radiculopathy   . Malignant melanoma of skin of upper limb, including shoulder ([place])   . Old myocardial infarction   . Personal history of malignant melanoma of skin   . Personal history of other diseases of digestive system   . Thoracic or lumbosacral neuritis or radiculitis, unspecified   . Unspecified essential  hypertension    Past Surgical History:  Procedure Laterality Date  . ARTHRODESIS  07/06/2002   of left long finger distal interphalangeal joint with Kirschner wire fixation X 3  . ATRIAL ABLATION SURGERY  05/04/2001   Dr. [name]  . CARDIAC CATHETERIZATION  09/05/2008   Revealing 4 of 4 patent grafts with native multivessel coronary artery disease, EF  of 60% without regional wall motion abnormalities.  Marland Kitchen CATARACT EXTRACTION, BILATERAL    . CHOLECYSTECTOMY    . COLONOSCOPY    . CORONARY ANGIOPLASTY  1993  . CORONARY ARTERY BYPASS GRAFT  10/17/2000   [name]. [name], [place]     AFTER LIVER TRAUMA AND HAND SURGERY  . EYE SURGERY    . HERNIA REPAIR     BILATERAL  . LUMBAR LAMINECTOMY/DECOMPRESSION MICRODISCECTOMY N/A 02/02/2016   Procedure: MICRODISCECTOMY LUMBAR FOUR- LUMBAR FIVE;  Surgeon: Ashok Pall, MD;  Location: [facility];  Service: Neurosurgery;  Laterality: N/A;  MICRODISCECTOMY L4-L5  . LUMBAR LAMINECTOMY/DECOMPRESSION MICRODISCECTOMY Right 08/02/2016   Procedure: MICRODISCECTOMY LUMBAR FOUR- LUMBAR FIVE RIGHT;  Surgeon: Ashok Pall, MD;  Location: [facility];  Service: Neurosurgery;  Laterality: Right;  . punctured eardrum      to relieve blood build up   for ear infection  . TONSILLECTOMY      reports that he quit smoking about 61 years ago. He has never used smokeless tobacco. He reports that he drinks alcohol. He reports that he does not use drugs. family history includes Heart disease in his father.  Allergies  Allergen Reactions  . Iohexol Other (See Comments)     Code: HIVES, Desc: PATENT STATES HE IS ALLERGIC TO IV DYE 09/14/08/RM, Onset Date: [id number]   . Ciprofloxacin Hives and Rash    "Patient unsure" just know he has a reaction to med Medical record indicates allergic reaction  . Other     UNSPECIFIED REACTION  Angioplasty dye pt.>> Pt only recalls Had "very bad reaction"   Current Outpatient Prescriptions on File Prior to Visit    Medication Sig Dispense Refill  . albuterol (PROVENTIL HFA;VENTOLIN HFA) 108 (90 BASE) MCG/ACT inhaler Inhale 2 puffs into the lungs every 6 (six) hours as needed for wheezing or shortness of breath. 1 Inhaler 11  . aspirin EC 81 MG tablet Take 81 mg by mouth at bedtime.     . finasteride (PROSCAR) 5 MG tablet Take 5 mg by mouth at bedtime.     . fludrocortisone (FLORINEF) 0.1 MG tablet Take 1 tablet (0.1 mg total) by mouth daily. 30 tablet 1  . nitroGLYCERIN (NITROSTAT) 0.4 MG SL tablet Place 1 tablet (0.4 mg total) under the tongue every 5 (five) minutes as needed for chest pain (x 3 doses). Reported on 06/15/2015 25 tablet 6  . nortriptyline (PAMELOR) 25 MG capsule Take 1 capsule (25 mg total) by mouth at bedtime. 90 capsule 2  . omeprazole (PRILOSEC) 20 MG capsule Take 20 mg by mouth at bedtime.    . rosuvastatin (CRESTOR) 10 MG tablet Take 10 mg by mouth at bedtime.     . sertraline (ZOLOFT) 50 MG tablet TAKE 1 TABLET DAILY (Patient taking differently: TAKE 1 TABLET DAILY IN THE EVENING) 90 tablet 3  . tamsulosin (FLOMAX) 0.4 MG CAPS capsule TAKE 1 CAPSULE DAILY (Patient taking differently: TAKE 1 CAPSULE AT BEDTIME.) 90 capsule 1  . traMADol (ULTRAM) 50 MG tablet Take 50 mg by mouth every 6 (six) hours as needed for pain.     No current facility-administered medications on file prior to visit.    Review of Systems  Constitutional: Negative for other unusual diaphoresis or sweats HENT: Negative for ear discharge or swelling Eyes: Negative for other worsening visual disturbances Respiratory: Negative for stridor or other swelling  Gastrointestinal: Negative for worsening distension or other blood Genitourinary: Negative for retention or other urinary change Musculoskeletal: Negative for other MSK pain or swelling Skin: Negative for color change or other new lesions Neurological: Negative for worsening tremors and other numbness  Psychiatric/Behavioral: Negative for worsening agitation  or other fatigue All other system neg per pt    Objective:   Physical Exam BP (!) 150/82   Pulse 94   Ht 5\' 9"  (1.753 m)   Wt 170 lb (77.1 kg)   SpO2 99%   BMI 25.10 kg/m  VS noted,  Constitutional: Pt appears in NAD HENT: Head: NCAT.  Right Ear: External ear normal.  Left Ear: External ear normal.  Eyes: . Pupils are equal, round, and reactive to light. Conjunctivae and EOM are normal Nose: without d/c or deformity Neck: Neck supple. Gross normal ROM Cardiovascular: Normal rate and regular rhythm.   Pulmonary/Chest: Effort normal and breath sounds without rales or wheezing.  Abd:  Soft, NT, ND, + BS, no organomegaly Neurological: Pt is alert. At baseline orientation, motor grossly intact Skin: Skin is warm. No rashes, other new lesions, no LE edema Psychiatric: Pt behavior is normal without agitation  No other exam fidnings  POCT glycosylated hemoglobin (Hb A1C)  Order: [id number]  Status:  Final result Visible to patient:  No (Not Released) Dx:  Prediabetes   11:43  Hemoglobin A1C 5.7            Assessment & Plan:

## 2016-09-05 NOTE — Patient Instructions (Signed)
Yout A1c is OK today  Please continue all other medications as before, and refills have been done if requested.  Please have the pharmacy call with any other refills you may need.  Please keep your appointments with your specialists as you may have planned  Please return in 6 months, or sooner if needed, with Lab testing done 3-5 days before

## 2016-09-08 NOTE — Assessment & Plan Note (Signed)

## 2016-09-08 NOTE — Assessment & Plan Note (Signed)
stable overall by history and exam, recent data reviewed with pt, and pt to continue medical treatment as before,  to f/u any worsening symptoms or concerns Lab Results  Component Value Date   HGBA1C 5.7 09/05/2016

## 2016-09-09 ENCOUNTER — Other Ambulatory Visit: Payer: Self-pay | Admitting: Internal Medicine

## 2016-09-13 ENCOUNTER — Telehealth: Payer: Self-pay | Admitting: Internal Medicine

## 2016-09-13 NOTE — Telephone Encounter (Signed)
Needs verbals occupational therapy 2x3 starting next week

## 2016-09-17 ENCOUNTER — Encounter: Payer: Self-pay | Admitting: Internal Medicine

## 2016-09-17 ENCOUNTER — Ambulatory Visit (INDEPENDENT_AMBULATORY_CARE_PROVIDER_SITE_OTHER): Payer: Medicare Other | Admitting: Internal Medicine

## 2016-09-17 ENCOUNTER — Telehealth: Payer: Self-pay | Admitting: Internal Medicine

## 2016-09-17 VITALS — BP 158/94 | HR 89 | Ht 69.0 in | Wt 173.0 lb

## 2016-09-17 DIAGNOSIS — R42 Dizziness and giddiness: Secondary | ICD-10-CM

## 2016-09-17 DIAGNOSIS — I951 Orthostatic hypotension: Secondary | ICD-10-CM | POA: Diagnosis not present

## 2016-09-17 MED ORDER — LOSARTAN POTASSIUM 50 MG PO TABS: 50.0000 mg | ORAL_TABLET | Freq: Every day | ORAL | 3 refills | Status: DC

## 2016-09-17 NOTE — Progress Notes (Signed)
ELECTROPHYSIOLOGY CONSULT NOTE  Patient ID: Troy Combs, MRN: [id number], DOB/AGE: February 06, 1932 83 y.o. Admit date: (Not on file) Date of Consult: 09/17/2016  Primary Physicia   Biagio Borg, MD Primary Cardiologist: Troy Combs is being seen today for the evaluation of orthostatic hypotension at the request of [name]   HPI Troy Combs is a 83 y.o. male  Referred because of orthostatic lightheadedness. This began in the temporal context of having had a series of back operations beginning September 2018. He had taken an airplane trip to [place]. Severe back pain. He ended up seeing Dr. Cyndy Freeze who undertook surgery January 2018 and then repeat surgery in April [id number]--GURK was complicated by a CSF leak that healed on its own. He went to rehabilitation. He was noted to have significant orthostatic hypotension associated with dizziness.  (Reviewing the old chart, he is seen and noted to have orthostatic hypotension in the past) or so I will work on getting to the emergency room as so  He ended up being treated with fludrocortisone and a recommendation for salt tablets.   Is a history of hypertension is evident through the medical record intermittently.. It may have been masked by his beta blockers (see below).  He denies shower intolerance. He has some early morning orthostatic intolerance he has not had syncope.  He has had problems with throbbing headaches associated with hypertension in the mornings particularly.  He has a history of coronary artery disease with prior bypass surgery 2004. Last catheterization 5/10 demonstrated patent LAD vein grafts to the RCA OM1 and OM 2 with normal LV function.  He's had a history of symptomatic PVCs treated with metoprolol succinate; this had to be down titrated because of fatigue and dizziness. He ultimately ended up on carvedilol.  Last echocardiogram in 2016 EF normal (hyperdynamic) Myoview scanning demonstrated no  ischemia  Past Medical History:  Diagnosis Date  . Allergic rhinitis 09/20/2014  . Anemia, unspecified   . Arthritis   . Bladder neck obstruction   . Cataracts, bilateral   . Coronary atherosclerosis of unspecified type of vessel, native or graft   . Diminished hearing, bilateral   . Dysthymic disorder   . GERD (gastroesophageal reflux disease)   . Heart murmur    as a child  . Impaired glucose tolerance 12/02/2012  . Impotence of organic origin   . Lumbar radiculopathy   . Malignant melanoma of skin of upper limb, including shoulder ([place])   . Old myocardial infarction   . Personal history of malignant melanoma of skin   . Personal history of other diseases of digestive system   . Thoracic or lumbosacral neuritis or radiculitis, unspecified   . Unspecified essential hypertension       Surgical History:  Past Surgical History:  Procedure Laterality Date  . ARTHRODESIS  07/06/2002   of left long finger distal interphalangeal joint with Kirschner wire fixation X 3  . ATRIAL ABLATION SURGERY  05/04/2001   Dr. [name]  . CARDIAC CATHETERIZATION  09/05/2008   Revealing 4 of 4 patent grafts with native multivessel coronary artery disease, EF  of 60% without regional wall motion abnormalities.  Marland Kitchen CATARACT EXTRACTION, BILATERAL    . CHOLECYSTECTOMY    . COLONOSCOPY    . CORONARY ANGIOPLASTY  1993  . CORONARY ARTERY BYPASS GRAFT  10/17/2000   [name]. [name], [place]     AFTER LIVER TRAUMA AND HAND SURGERY  .  EYE SURGERY    . HERNIA REPAIR     BILATERAL  . LUMBAR LAMINECTOMY/DECOMPRESSION MICRODISCECTOMY N/A 02/02/2016   Procedure: MICRODISCECTOMY LUMBAR FOUR- LUMBAR FIVE;  Surgeon: Ashok Pall, MD;  Location: [facility];  Service: Neurosurgery;  Laterality: N/A;  MICRODISCECTOMY L4-L5  . LUMBAR LAMINECTOMY/DECOMPRESSION MICRODISCECTOMY Right 08/02/2016   Procedure: MICRODISCECTOMY LUMBAR FOUR- LUMBAR FIVE RIGHT;  Surgeon: Ashok Pall, MD;  Location: [facility];   Service: Neurosurgery;  Laterality: Right;  . punctured eardrum      to relieve blood build up   for ear infection  . TONSILLECTOMY       Home Meds: Prior to Admission medications   Medication Sig Start Date End Date Taking? Authorizing Provider  albuterol (PROVENTIL HFA;VENTOLIN HFA) 108 (90 BASE) MCG/ACT inhaler Inhale 2 puffs into the lungs every 6 (six) hours as needed for wheezing or shortness of breath. 08/10/14  Yes Biagio Borg, MD  aspirin EC 81 MG tablet Take 81 mg by mouth at bedtime.    Yes [provider]  finasteride (PROSCAR) 5 MG tablet Take 5 mg by mouth at bedtime.    Yes [provider]  fludrocortisone (FLORINEF) 0.1 MG tablet Take 1 tablet (0.1 mg total) by mouth daily. 08/26/16  Yes Eileen Stanford, PA-C  nitroGLYCERIN (NITROSTAT) 0.4 MG SL tablet Place 1 tablet (0.4 mg total) under the tongue every 5 (five) minutes as needed for chest pain (x 3 doses). Reported on 06/15/2015 11/09/15  Yes Dorothy Spark, MD  nortriptyline (PAMELOR) 25 MG capsule Take 1 capsule (25 mg total) by mouth at bedtime. 04/15/16  Yes Biagio Borg, MD  omeprazole (PRILOSEC) 20 MG capsule Take 20 mg by mouth at bedtime.   Yes [provider]  rosuvastatin (CRESTOR) 10 MG tablet Take 10 mg by mouth at bedtime.  06/11/16  Yes [provider]  sertraline (ZOLOFT) 50 MG tablet TAKE 1 TABLET DAILY Patient taking differently: TAKE 1 TABLET DAILY IN THE EVENING 01/24/16  Yes Biagio Borg, MD  tamsulosin (FLOMAX) 0.4 MG CAPS capsule TAKE 1 CAPSULE DAILY 09/09/16  Yes Biagio Borg, MD  traMADol (ULTRAM) 50 MG tablet Take 50 mg by mouth every 6 (six) hours as needed for pain. 07/15/16  Yes [provider]    Allergies:  Allergies  Allergen Reactions  . Iohexol Other (See Comments)     Code: HIVES, Desc: PATENT STATES HE IS ALLERGIC TO IV DYE 09/14/08/RM, Onset Date: [id number]   . Ciprofloxacin Hives and Rash    "Patient unsure" just know he has a reaction  to med Medical record indicates allergic reaction  . Other     UNSPECIFIED REACTION  Angioplasty dye pt.>> Pt only recalls Had "very bad reaction"    Social History   Social History  . Marital status: Married    Spouse name: N/A  . Number of children: 5  . Years of education: N/A   Occupational History  . Not on file.   Social History Main Topics  . Smoking status: Former Smoker    Quit date: 09/21/1955  . Smokeless tobacco: Never Used  . Alcohol use 0.0 oz/week     Comment: one or two ounces a week  . Drug use: No  . Sexual activity: Not on file   Other Topics Concern  . Not on file   Social History Narrative   Lives with wife in a one story home.  Has 5 children.  Retired Freight forwarder for [employer].  Education:  2 years of college.      Family History  Problem Relation Age of Onset  . Heart disease Father   . Ataxia Neg Hx   . Chorea Neg Hx   . Dementia Neg Hx   . Mental retardation Neg Hx   . Migraines Neg Hx   . Multiple sclerosis Neg Hx   . Neurofibromatosis Neg Hx   . Neuropathy Neg Hx   . Parkinsonism Neg Hx   . Seizures Neg Hx   . Stroke Neg Hx      ROS:  Please see the history of present illness.     All other systems reviewed and negative.    Physical Exam: Blood pressure (!) 158/94, pulse 89, height 5\' 9"  (1.753 m), weight 173 lb (78.5 kg), SpO2 95 %. General: Well developed, well nourished male in no acute distress. Head: Normocephalic, atraumatic, sclera non-icteric, no xanthomas, nares are without discharge. EENT: normal  Lymph Nodes:  none Neck: Negative for carotid bruits. JVD not elevated. Back:without scoliosis kyphosis Lungs: Clear bilaterally to auscultation without wheezes, rales, or rhonchi. Breathing is unlabored. Heart: RRR with S1 S2. No murmur . No rubs, or gallops appreciated. Abdomen: Soft, non-tender, non-distended with normoactive bowel sounds. No hepatomegaly. No rebound/guarding. No obvious abdominal masses. Msk:  Strength and tone  appear normal for age. Extremities: No clubbing or cyanosis. No edema.  Distal pedal pulses are 2+ and equal bilaterally. Skin: Warm and Dry Neuro: Alert and oriented X 3. CN III-XII intact Grossly normal sensory and motor function . Psych:  Responds to questions appropriately with a normal affect.      Labs: Cardiac Enzymes No results for input(s): CKTOTAL, CKMB, TROPONINI in the last 72 hours. CBC Lab Results  Component Value Date   WBC 7.9 08/01/2016   HGB 15.0 08/01/2016   HCT 43.5 08/01/2016   MCV 86.0 08/01/2016   PLT 122 (L) 08/01/2016   PROTIME: No results for input(s): LABPROT, INR in the last 72 hours. Chemistry No results for input(s): NA, K, CL, CO2, BUN, CREATININE, CALCIUM, PROT, BILITOT, ALKPHOS, ALT, AST, GLUCOSE in the last 168 hours.  Invalid input(s): LABALBU Lipids Lab Results  Component Value Date   CHOL 110 01/24/2016   HDL 49.70 01/24/2016   LDLCALC 34 01/24/2016   TRIG 133.0 01/24/2016   BNP Pro B Natriuretic peptide (BNP)  Date/Time Value Ref Range Status  08/13/2010 05:25 PM 85.3 0.0 - 100.0 pg/mL Final   Thyroid Function Tests: No results for input(s): TSH, T4TOTAL, T3FREE, THYROIDAB in the last 72 hours.  Invalid input(s): FREET3 Miscellaneous No results found for: DDIMER  Radiology/Studies:  No results found.  EKG: Sinus rhythm at 86 Intervals 18/08/37 Otherwise normal    Assessment and Plan:  Orthostatic hypotension  Systolic hypertension  Coronary artery disease with prior bypass  Back injury with multiple back surgeries, by CSF leak  The patient has significant orthostatic hypotension which clearly antedates his back surgery. Mechanism is not clear but is likely related to long-standing systolic hypertension. He has need of antihypertensive therapy with his supine symptoms and supine blood pressures over 170. We will begin him on losartan at night.  We will attempt nonpharmacologic therapy for his orthostasis is an  abdominal binder. In the event that this is insufficient, pyridostigmine and/or Mestinon could be used pharmacologically. He takes number of medications which can aggravate his orthostasis. Unfortunately, he does not think that he can come off of his alpha-blocker's. I've asked him follow-up with his urologist  about this  We discussed extensively the issues of dysautonomia, the physiology of orthstasis and positional stress.  We discussed the role  of water repletion and the awareness of triggers and the role of ambient heat and dehydration     Virl Axe   . .dpos

## 2016-09-17 NOTE — Telephone Encounter (Signed)
LVM for [place] and stated that its ok for pt to attend appt.

## 2016-09-17 NOTE — Telephone Encounter (Signed)
Occupational therapists called and is reporting Pts BP, BP is laying down, 171/100. Sitting up it was 179/103,   Pt has a headache, and a little unsteady on feet. Does have appt with Cardio at 3 today.  Please advise.

## 2016-09-17 NOTE — Telephone Encounter (Signed)
LVM for [place] with the ok for verbal orders.

## 2016-09-17 NOTE — Patient Instructions (Signed)
Medication Instructions: - Your physician has recommended you make the following change in your medication:  1) Start losartan 50 mg- take one tablet by mouth once daily at bedtime  Labwork: - none ordered  Procedures/Testing: - none ordered  Follow-Up: - Your physician recommends that you schedule a follow-up appointment in: 4-5 weeks with Chanetta Marshall, NP for [name].  Any Additional Special Instructions Will Be Listed Below (If Applicable).  - Please wear an abdominal binder (this can be found at the pharmacy)   If you need a refill on your cardiac medications before your next appointment, please call your pharmacy.

## 2016-09-17 NOTE — Telephone Encounter (Signed)
Ok for verbals 

## 2016-09-17 NOTE — Telephone Encounter (Signed)
Ok to keep cardiology appt

## 2016-09-20 ENCOUNTER — Observation Stay (HOSPITAL_COMMUNITY)
Admission: EM | Admit: 2016-09-20 | Discharge: 2016-09-22 | Disposition: A | Discharge status: Home / Self Care | Payer: Medicare Other | Attending: Family Medicine | Admitting: Family Medicine

## 2016-09-20 ENCOUNTER — Telehealth: Payer: Self-pay | Admitting: Internal Medicine

## 2016-09-20 ENCOUNTER — Encounter (HOSPITAL_COMMUNITY): Payer: Self-pay | Admitting: *Deleted

## 2016-09-20 DIAGNOSIS — I252 Old myocardial infarction: Secondary | ICD-10-CM | POA: Insufficient documentation

## 2016-09-20 DIAGNOSIS — I951 Orthostatic hypotension: Secondary | ICD-10-CM | POA: Diagnosis not present

## 2016-09-20 DIAGNOSIS — Z880 Allergy status to penicillin: Secondary | ICD-10-CM | POA: Diagnosis not present

## 2016-09-20 DIAGNOSIS — K219 Gastro-esophageal reflux disease without esophagitis: Secondary | ICD-10-CM | POA: Diagnosis not present

## 2016-09-20 DIAGNOSIS — R7303 Prediabetes: Secondary | ICD-10-CM | POA: Insufficient documentation

## 2016-09-20 DIAGNOSIS — Z87891 Personal history of nicotine dependence: Secondary | ICD-10-CM | POA: Diagnosis not present

## 2016-09-20 DIAGNOSIS — E782 Mixed hyperlipidemia: Secondary | ICD-10-CM | POA: Insufficient documentation

## 2016-09-20 DIAGNOSIS — Z7982 Long term (current) use of aspirin: Secondary | ICD-10-CM | POA: Diagnosis not present

## 2016-09-20 DIAGNOSIS — N400 Enlarged prostate without lower urinary tract symptoms: Secondary | ICD-10-CM | POA: Diagnosis not present

## 2016-09-20 DIAGNOSIS — M48061 Spinal stenosis, lumbar region without neurogenic claudication: Secondary | ICD-10-CM | POA: Diagnosis present

## 2016-09-20 DIAGNOSIS — I25810 Atherosclerosis of coronary artery bypass graft(s) without angina pectoris: Secondary | ICD-10-CM

## 2016-09-20 DIAGNOSIS — F418 Other specified anxiety disorders: Secondary | ICD-10-CM

## 2016-09-20 DIAGNOSIS — I2510 Atherosclerotic heart disease of native coronary artery without angina pectoris: Secondary | ICD-10-CM | POA: Diagnosis present

## 2016-09-20 DIAGNOSIS — M5116 Intervertebral disc disorders with radiculopathy, lumbar region: Secondary | ICD-10-CM | POA: Diagnosis not present

## 2016-09-20 DIAGNOSIS — I10 Essential (primary) hypertension: Secondary | ICD-10-CM | POA: Diagnosis not present

## 2016-09-20 DIAGNOSIS — Z951 Presence of aortocoronary bypass graft: Secondary | ICD-10-CM | POA: Diagnosis not present

## 2016-09-20 DIAGNOSIS — F341 Dysthymic disorder: Secondary | ICD-10-CM | POA: Diagnosis present

## 2016-09-20 LAB — CBC
HCT: 42.3 % (ref 39.0–52.0)
Hemoglobin: 14.6 g/dL (ref 13.0–17.0)
MCH: 29.5 pg (ref 26.0–34.0)
MCHC: 34.5 g/dL (ref 30.0–36.0)
MCV: 85.5 fL (ref 78.0–100.0)
Platelets: 184 10*3/uL (ref 150–400)
RBC: 4.95 MIL/uL (ref 4.22–5.81)
RDW: 14 % (ref 11.5–15.5)
WBC: 8.5 10*3/uL (ref 4.0–10.5)

## 2016-09-20 LAB — URINALYSIS, ROUTINE W REFLEX MICROSCOPIC
Bilirubin Urine: NEGATIVE
Glucose, UA: NEGATIVE mg/dL
Hgb urine dipstick: NEGATIVE
Ketones, ur: NEGATIVE mg/dL
Leukocytes, UA: NEGATIVE
Nitrite: NEGATIVE
Protein, ur: NEGATIVE mg/dL
Specific Gravity, Urine: 1.012 (ref 1.005–1.030)
pH: 5 (ref 5.0–8.0)

## 2016-09-20 LAB — BASIC METABOLIC PANEL
Anion gap: 8 (ref 5–15)
BUN: 15 mg/dL (ref 6–20)
CO2: 22 mmol/L (ref 22–32)
Calcium: 9.5 mg/dL (ref 8.9–10.3)
Chloride: 108 mmol/L (ref 101–111)
Creatinine, Ser: 0.7 mg/dL (ref 0.61–1.24)
GFR calc Af Amer: >60 mL/min (ref >60)
GFR calc non Af Amer: >60 mL/min (ref >60)
Glucose, Bld: 132 mg/dL — ABNORMAL HIGH (ref 65–99)
Potassium: 3.8 mmol/L (ref 3.5–5.1)
Sodium: 138 mmol/L (ref 135–145)

## 2016-09-20 LAB — CBG MONITORING, ED: Glucose-Capillary: 128 mg/dL — ABNORMAL HIGH (ref 65–99)

## 2016-09-20 MED ORDER — LOSARTAN POTASSIUM 50 MG PO TABS
50.0000 mg | ORAL_TABLET | Freq: Every day | ORAL | Status: DC
  Administered 2016-09-21: 50 mg via ORAL
  Filled 2016-09-20: qty 1

## 2016-09-20 MED ORDER — BISACODYL 5 MG PO TBEC: 5.0000 mg | DELAYED_RELEASE_TABLET | Freq: Every day | ORAL | Status: DC | PRN

## 2016-09-20 MED ORDER — ENOXAPARIN SODIUM 40 MG/0.4ML ~~LOC~~ SOLN
40.0000 mg | Freq: Every day | SUBCUTANEOUS | Status: DC
  Administered 2016-09-21 – 2016-09-22 (×2): 40 mg via SUBCUTANEOUS
  Filled 2016-09-20 (×2): qty 0.4

## 2016-09-20 MED ORDER — SODIUM CHLORIDE 0.9 % IV SOLN
INTRAVENOUS | Status: AC
  Administered 2016-09-20: 23:00:00 via INTRAVENOUS

## 2016-09-20 MED ORDER — ACETAMINOPHEN 325 MG PO TABS
650.0000 mg | ORAL_TABLET | Freq: Four times a day (QID) | ORAL | Status: DC | PRN
  Administered 2016-09-21 – 2016-09-22 (×2): 650 mg via ORAL
  Filled 2016-09-20 (×2): qty 2

## 2016-09-20 MED ORDER — KETOROLAC TROMETHAMINE 15 MG/ML IJ SOLN: 15.0000 mg | Freq: Four times a day (QID) | INTRAMUSCULAR | Status: DC | PRN

## 2016-09-20 MED ORDER — SODIUM CHLORIDE 0.9 % IV BOLUS (SEPSIS)
500.0000 mL | Freq: Once | INTRAVENOUS | Status: AC
  Administered 2016-09-20: 500 mL via INTRAVENOUS

## 2016-09-20 MED ORDER — ALBUTEROL SULFATE (2.5 MG/3ML) 0.083% IN NEBU: 2.5000 mg | INHALATION_SOLUTION | Freq: Four times a day (QID) | RESPIRATORY_TRACT | Status: DC | PRN

## 2016-09-20 MED ORDER — POLYETHYLENE GLYCOL 3350 17 G PO PACK
17.0000 g | PACK | Freq: Every day | ORAL | Status: DC | PRN
Start: 2016-09-20 — End: 2016-09-22

## 2016-09-20 MED ORDER — FINASTERIDE 5 MG PO TABS: 5.0000 mg | ORAL_TABLET | Freq: Every day | ORAL | 3 refills | Status: DC

## 2016-09-20 MED ORDER — SERTRALINE HCL 50 MG PO TABS
50.0000 mg | ORAL_TABLET | Freq: Every evening | ORAL | Status: DC
  Administered 2016-09-21: 50 mg via ORAL
  Filled 2016-09-20: qty 1

## 2016-09-20 MED ORDER — ASPIRIN EC 81 MG PO TBEC
81.0000 mg | DELAYED_RELEASE_TABLET | Freq: Every day | ORAL | Status: DC
  Administered 2016-09-21 (×2): 81 mg via ORAL
  Filled 2016-09-20 (×2): qty 1

## 2016-09-20 MED ORDER — ONDANSETRON HCL 4 MG PO TABS: 4.0000 mg | ORAL_TABLET | Freq: Four times a day (QID) | ORAL | Status: DC | PRN

## 2016-09-20 MED ORDER — ACETAMINOPHEN 650 MG RE SUPP: 650.0000 mg | Freq: Four times a day (QID) | RECTAL | Status: DC | PRN

## 2016-09-20 MED ORDER — AMLODIPINE BESYLATE 10 MG PO TABS
10.0000 mg | ORAL_TABLET | Freq: Every day | ORAL | Status: DC
  Administered 2016-09-20: 10 mg via ORAL
  Filled 2016-09-20: qty 1

## 2016-09-20 MED ORDER — NORTRIPTYLINE HCL 25 MG PO CAPS
25.0000 mg | ORAL_CAPSULE | Freq: Every day | ORAL | Status: DC
  Administered 2016-09-21 (×2): 25 mg via ORAL
  Filled 2016-09-20 (×2): qty 1

## 2016-09-20 MED ORDER — HYDROCODONE-ACETAMINOPHEN 5-325 MG PO TABS: 1.0000 | ORAL_TABLET | ORAL | Status: DC | PRN

## 2016-09-20 MED ORDER — ONDANSETRON HCL 4 MG/2ML IJ SOLN: 4.0000 mg | Freq: Four times a day (QID) | INTRAMUSCULAR | Status: DC | PRN

## 2016-09-20 MED ORDER — ROSUVASTATIN CALCIUM 10 MG PO TABS
10.0000 mg | ORAL_TABLET | Freq: Every day | ORAL | Status: DC
  Administered 2016-09-21 (×2): 10 mg via ORAL
  Filled 2016-09-20 (×2): qty 1

## 2016-09-20 MED ORDER — SODIUM CHLORIDE 0.9% FLUSH
3.0000 mL | Freq: Two times a day (BID) | INTRAVENOUS | Status: DC
  Administered 2016-09-21 – 2016-09-22 (×3): 3 mL via INTRAVENOUS

## 2016-09-20 MED ORDER — LABETALOL HCL 5 MG/ML IV SOLN
5.0000 mg | INTRAVENOUS | Status: DC | PRN
  Administered 2016-09-20 – 2016-09-21 (×2): 5 mg via INTRAVENOUS
  Filled 2016-09-20 (×2): qty 4

## 2016-09-20 NOTE — Telephone Encounter (Signed)
Physical therapists called stating the Pts balance is off, he has been declining every day and is back on his walker. Pt is also very confued no slurred speech but he is very confused. Pt feet feel very heavy to him.   Scored a high score for fall risk   Pts BP is up and down on 5/22  BP was 179/103   Would like a call back regarding his health.

## 2016-09-20 NOTE — Telephone Encounter (Signed)
Needs to go to ED now due to weakness, high fall risk and confusion

## 2016-09-20 NOTE — Telephone Encounter (Signed)
Pt and wife were reviewing his med list online. They saw finasteride (PROSCAR) 5 MG tablet   on the med list but the Pt does have this medication. They would like to know if he is supposed to be taking this medication and if so they needs [facility]

## 2016-09-20 NOTE — Telephone Encounter (Signed)
I spoke with Caitlynn and she voiced her concerns of confusion, BP issue, and weakness. I informed her that PCP also agrees pt should go to the ED. PT stated to [facility] they didn't want to go due to the long wait time.   I called pt's wife to inform her that he needs to be taken to the ED for evaluation and explained how important that it was for him to be seen. I convinced her and her husband to go to the ED or either have EMS come out to the home. I offered to call the nonemergency line for them but she stated that she would call 911 herself to have them come out to see her husband and transport him to the ED. I also informed the wife if EMS needs to call the office or Caitlynn for clarification on why he needs to be transported they are welcome to do so.

## 2016-09-20 NOTE — Telephone Encounter (Signed)
Dr John please advise.  

## 2016-09-20 NOTE — H&P (Signed)
History and Physical    Troy Combs IFO:[id number] DOB: 27-Oct-1931 DOA: 09/20/2016  PCP: Biagio Borg, MD   Patient coming from: Home  Chief Complaint: Lightheaded upon standing   HPI: Troy Combs is a 83 y.o. male with medical history significant for hypertension, depression with anxiety, BPH, lumbar spinal stenosis status post decompression, and CAD status post CABG who presents the emergency department for evaluation of lightheadedness upon standing and blood pressure lability. Patient was noted over a month ago to have orthostatic hypotension and has been following with cardiology for this. One month ago, he was instructed to liberalize his fluid and salt intake, wear compression stockings, start Florinef, and stop Coreg. This may have helped some, but upon follow-up several days ago, he is noted to be markedly hypertensive and was started on losartan. Since that time, he reports severe lightheadedness upon standing. He has been working with physical therapy, but has been having increasing difficulty with that due to his labile blood pressure. He denies any syncopal episodes, denies any chest pain, and denies palpitations. There has not been any headache, change in vision or hearing, or focal numbness or weakness. Patient was advised over month ago that he should stop his BPH medications, but he was unwilling to do this at that time.  ED Course: Upon arrival to the ED, patient is found to be afebrile, saturating well on room air, and with vital signs stable. He is noted to have a 50 mmHg drop in SBP from lying to standing. EKG features a sinus rhythm, chemistry panels unremarkable, and CBC is unremarkable. Urinalysis is also unremarkable. Patient was treated with 1.5 L of normal saline and experienced improvement with this. Cardiology was consulted by the ED physician and advised for a medical admission. Patient became hypertensive in the ED, but has been otherwise stable and will be  observed on the telemetry unit for ongoing evaluation and management of orthostasis.  Review of Systems:  All other systems reviewed and apart from HPI, are negative.  Past Medical History:  Diagnosis Date  . Allergic rhinitis 09/20/2014  . Anemia, unspecified   . Arthritis   . Bladder neck obstruction   . Cataracts, bilateral   . Coronary atherosclerosis of unspecified type of vessel, native or graft   . Diminished hearing, bilateral   . Dysthymic disorder   . GERD (gastroesophageal reflux disease)   . Heart murmur    as a child  . Impaired glucose tolerance 12/02/2012  . Impotence of organic origin   . Lumbar radiculopathy   . Malignant melanoma of skin of upper limb, including shoulder ([place])   . Old myocardial infarction   . Personal history of malignant melanoma of skin   . Personal history of other diseases of digestive system   . Thoracic or lumbosacral neuritis or radiculitis, unspecified   . Unspecified essential hypertension     Past Surgical History:  Procedure Laterality Date  . ARTHRODESIS  07/06/2002   of left long finger distal interphalangeal joint with Kirschner wire fixation X 3  . ATRIAL ABLATION SURGERY  05/04/2001   Dr. [name]  . CARDIAC CATHETERIZATION  09/05/2008   Revealing 4 of 4 patent grafts with native multivessel coronary artery disease, EF  of 60% without regional wall motion abnormalities.  Marland Kitchen CATARACT EXTRACTION, BILATERAL    . CHOLECYSTECTOMY    . COLONOSCOPY    . CORONARY ANGIOPLASTY  1993  . CORONARY ARTERY BYPASS GRAFT  10/17/2000   [name].  [name], [place]     AFTER LIVER TRAUMA AND HAND SURGERY  . EYE SURGERY    . HERNIA REPAIR     BILATERAL  . LUMBAR LAMINECTOMY/DECOMPRESSION MICRODISCECTOMY N/A 02/02/2016   Procedure: MICRODISCECTOMY LUMBAR FOUR- LUMBAR FIVE;  Surgeon: Ashok Pall, MD;  Location: [place];  Service: Neurosurgery;  Laterality: N/A;  MICRODISCECTOMY L4-L5  . LUMBAR LAMINECTOMY/DECOMPRESSION  MICRODISCECTOMY Right 08/02/2016   Procedure: MICRODISCECTOMY LUMBAR FOUR- LUMBAR FIVE RIGHT;  Surgeon: Ashok Pall, MD;  Location: [place];  Service: Neurosurgery;  Laterality: Right;  . punctured eardrum      to relieve blood build up   for ear infection  . TONSILLECTOMY       reports that he quit smoking about 61 years ago. He has never used smokeless tobacco. He reports that he drinks alcohol. He reports that he does not use drugs.  Allergies  Allergen Reactions  . Iohexol Other (See Comments)     Code: HIVES, Desc: PATENT STATES HE IS ALLERGIC TO IV DYE 09/14/08/RM, Onset Date: [id number]   . Ciprofloxacin Hives and Rash    "Patient unsure" just know he has a reaction to med Medical record indicates allergic reaction  . Other     UNSPECIFIED REACTION  Angioplasty dye pt.>> Pt only recalls Had "very bad reaction"    Family History  Problem Relation Age of Onset  . Heart disease Father   . Ataxia Neg Hx   . Chorea Neg Hx   . Dementia Neg Hx   . Mental retardation Neg Hx   . Migraines Neg Hx   . Multiple sclerosis Neg Hx   . Neurofibromatosis Neg Hx   . Neuropathy Neg Hx   . Parkinsonism Neg Hx   . Seizures Neg Hx   . Stroke Neg Hx      Prior to Admission medications   Medication Sig Start Date End Date Taking? Authorizing Provider  albuterol (PROVENTIL HFA;VENTOLIN HFA) 108 (90 BASE) MCG/ACT inhaler Inhale 2 puffs into the lungs every 6 (six) hours as needed for wheezing or shortness of breath. 08/10/14  Yes Biagio Borg, MD  aspirin EC 81 MG tablet Take 81 mg by mouth at bedtime.    Yes [provider]  finasteride (PROSCAR) 5 MG tablet Take 1 tablet (5 mg total) by mouth daily. 09/20/16  Yes Biagio Borg, MD  losartan (COZAAR) 50 MG tablet Take 1 tablet (50 mg total) by mouth at bedtime. 09/17/16 12/16/16 Yes Deboraha Sprang, MD  nitroGLYCERIN (NITROSTAT) 0.4 MG SL tablet Place 1 tablet (0.4 mg total) under the tongue every 5 (five) minutes as needed for chest pain  (x 3 doses). Reported on 06/15/2015 11/09/15  Yes Dorothy Spark, MD  nortriptyline (PAMELOR) 25 MG capsule Take 1 capsule (25 mg total) by mouth at bedtime. 04/15/16  Yes Biagio Borg, MD  omeprazole (PRILOSEC) 20 MG capsule Take 20 mg by mouth daily as needed (acid reflux).    Yes [provider]  rosuvastatin (CRESTOR) 10 MG tablet Take 10 mg by mouth at bedtime.  06/11/16  Yes [provider]  sertraline (ZOLOFT) 50 MG tablet TAKE 1 TABLET DAILY Patient taking differently: TAKE 1 TABLET DAILY IN THE EVENING 01/24/16  Yes Biagio Borg, MD  tamsulosin [facility]) 0.4 MG CAPS capsule TAKE 1 CAPSULE DAILY 09/09/16  Yes Biagio Borg, MD    Physical Exam: Vitals:   09/20/16 2145 09/20/16 2200 09/20/16 2215 09/20/16 2255  BP: Marland Kitchen)  187/95 (!) 167/104 (!) 169/103 (!) 196/110  Pulse: 74 75 79 80  Resp: 19 16 (!) 24 20  Temp:    98.6 F (37 C)  TempSrc:    Oral  SpO2: 95% 95% 96% 96%  Weight:    77.4 kg (170 lb 11.2 oz)  Height:    5\' 9"  (1.753 m)      Constitutional: NAD, calm, comfortable Eyes: PERTLA, lids and conjunctivae normal ENMT: Mucous membranes are moist. Posterior pharynx clear of any exudate or lesions.   Neck: normal, supple, no masses, no thyromegaly Respiratory: clear to auscultation bilaterally, no wheezing, no crackles. Normal respiratory effort.  Cardiovascular: S1 & S2 heard, regular rate and rhythm. No significant JVD. Abdomen: No distension, no tenderness, no masses palpated. Bowel sounds normal.  Musculoskeletal: no clubbing / cyanosis. No joint deformity upper and lower extremities.  Skin: no significant rashes, lesions, ulcers. Warm, dry, well-perfused. Neurologic: CN 2-12 grossly intact. Sensation intact, DTR normal. Strength 5/5 in all 4 limbs.  Psychiatric: Alert and oriented x 3. Calm and cooperative.     Labs on Admission: I have personally reviewed following labs and imaging studies  CBC:  Recent Labs Lab 09/20/16 1820  WBC 8.5    HGB 14.6  HCT 42.3  MCV 85.5  PLT 937   Basic Metabolic Panel:  Recent Labs Lab 09/20/16 1820  NA 138  K 3.8  CL 108  CO2 22  GLUCOSE 132*  BUN 15  CREATININE 0.70  CALCIUM 9.5   GFR: Estimated Creatinine Clearance: 71.2 mL/min (by C-G formula based on SCr of 0.7 mg/dL). Liver Function Tests: No results for input(s): AST, ALT, ALKPHOS, BILITOT, PROT, ALBUMIN in the last 168 hours. No results for input(s): LIPASE, AMYLASE in the last 168 hours. No results for input(s): AMMONIA in the last 168 hours. Coagulation Profile: No results for input(s): INR, PROTIME in the last 168 hours. Cardiac Enzymes: No results for input(s): CKTOTAL, CKMB, CKMBINDEX, TROPONINI in the last 168 hours. BNP (last 3 results) No results for input(s): PROBNP in the last 8760 hours. HbA1C: No results for input(s): HGBA1C in the last 72 hours. CBG:  Recent Labs Lab 09/20/16 1823  GLUCAP 128*   Lipid Profile: No results for input(s): CHOL, HDL, LDLCALC, TRIG, CHOLHDL, LDLDIRECT in the last 72 hours. Thyroid Function Tests: No results for input(s): TSH, T4TOTAL, FREET4, T3FREE, THYROIDAB in the last 72 hours. Anemia Panel: No results for input(s): VITAMINB12, FOLATE, FERRITIN, TIBC, IRON, RETICCTPCT in the last 72 hours. Urine analysis:    Component Value Date/Time   COLORURINE YELLOW 09/20/2016 2005   APPEARANCEUR CLEAR 09/20/2016 2005   LABSPEC 1.012 09/20/2016 2005   PHURINE 5.0 09/20/2016 2005   GLUCOSEU NEGATIVE 09/20/2016 2005   GLUCOSEU NEGATIVE 01/24/2016 0955   HGBUR NEGATIVE 09/20/2016 2005   BILIRUBINUR NEGATIVE 09/20/2016 2005   [place] 09/20/2016 2005   PROTEINUR NEGATIVE 09/20/2016 2005   UROBILINOGEN 2.0 (A) 01/24/2016 0955   NITRITE NEGATIVE 09/20/2016 2005   LEUKOCYTESUR NEGATIVE 09/20/2016 2005   Sepsis Labs: @LABRCNTIP (procalcitonin:4,lacticidven:4) )No results found for this or any previous visit (from the past 240 hour(s)).   Radiological Exams on  Admission: No results found.  EKG: Independently reviewed. Sinus rhythm.   Assessment/Plan  1. Orthostasis  - Pt presents with lightheadedness upon standing, noted to have 50 mmHg drop in SBP from laying to standing  - Has been ongoing problem for which he follows with cardiology - Has already been started on Florinef and had Coreg  replaced with losartan, but continues to be symptomatic, preventing him from working with PT  - He appears hypovolemic, notes a poor appetite, and will be hydrated with IVF  - Continue Florinef - Hold Finasteride and Flomax; Zolft and nortriptyline may also be contributing some, but far less likely than the BPH meds    2. Hypertension - Pt is hypertensive while supine - Plan to continue losartan    3. Depression, anxiety  - Stable, continue Zoloft and nortriptyline    4. CAD - Status-post CABG  - No anginal complaints  - EKG without acute ischemic features  - He is monitored on telemetry  - Continue ASA 81, Crestor, losartan   5. BPH - Plan to hold Finasteride and Flomax as above given orthostatic hypotension  - Advised to follow-up with urology    DVT prophylaxis: sq Lovenox Code Status: Full  Family Communication: Discussed with patient Disposition Plan: Observe on telemetry Consults called: None Admission status: Observation    Vianne Bulls, MD [facility] Pager [phone]  If 7PM-7AM, please contact night-coverage [email or web address] Password [email or web address]  09/20/2016, 11:32 PM

## 2016-09-20 NOTE — ED Notes (Signed)
Dr. Leonette Monarch notified on pt.'s elevated blood pressure.

## 2016-09-20 NOTE — ED Provider Notes (Signed)
Medical screening examination/treatment/procedure(s) were conducted as a shared visit with non-physician practitioner(s) and myself.  I personally evaluated the patient during the encounter. Briefly, the patient is a 83 y.o. male with past medical history significant for hypertension, orthostatic hypotension that presents to the emergency department today with complaints of dizziness on standing and decreased blood pressure. Patient noted to have significant orthostasis with 31mmHg drop in his systolic blood pressures from lying to standing. Patient provided with IV hydration. Also noted to have very labile blood pressures going from systolics in the 856D while lying down. Case discussed with cardiology who recommended admission to the hospitalist service for further workup and management.    EKG Interpretation  Date/Time:  Friday Sep 20 2016 18:13:45 EDT Ventricular Rate:  91 PR Interval:    QRS Duration: 90 QT Interval:  384 QTC Calculation: 473 R Axis:   -13 Text Interpretation:  Sinus rhythm No significant change since last tracing Confirmed by [address] MD, Joda Braatz ([id number]) on 09/20/2016 6:16:12 PM           Yaqub Arney, Grayce Sessions, MD 09/22/16 [phone]

## 2016-09-20 NOTE — Progress Notes (Signed)
Patient arrived from ed with elevated BP at 196/110. MD notified. Telemetry applied and CCMD called.

## 2016-09-20 NOTE — Telephone Encounter (Signed)
Ok to take and is sent erx, but please ask pt to make sure this was not stopped per urology for his prostate

## 2016-09-20 NOTE — ED Provider Notes (Signed)
[facility] DEPT Provider Note   CSN: [id number] Arrival date & time: 09/20/16  1733     History   Chief Complaint Chief Complaint  Patient presents with  . Dizziness    HPI CALIL AMOR is a 83 y.o. male.  HPI 83 year old Caucasian male past medical history significant for hypertension, orthostatic hypotension that presents to the emergency department today with complaints of dizziness on standing and decreased blood pressure. The patient recently had a lumbar laminectomy and decompression in April 2018. At that time patient had a CSF leak that repaired on its own. Patient was discharged to rehabilitation. Patient was discharged from rehabilitation 04/30. Should he has been receiving and home physical therapy. Patient states that he has been having issues with orthostatic hypotension. States that when he goes from a sitting to standing position his blood pressure drops and becomes dizzy and lightheaded and off balance. Patient has seen cardiology for issue last week. Patient does have hypertension and is to be on blood pressure medicine. They started him on losartan to see if this would control his symptoms. Patient is continuing to have decrease in blood pressure and dizziness upon standing. The patient states that he has decent fluid input. Denies any Lasix. Patient also states that when he goes from a sitting to standing position his balance is off. Patient denies any associated symptoms including vision changes, headache, lightheadedness, chest pain, shortness of breath, abdominal pain, nausea, emesis, urinary symptoms, change in bowel habits.  I reviewed her cardiologist recently note. They were going to try non-chronological therapy including abdominal binder. They feel the patient needs to be on the blood pressure medicine for his hypertension. They feel that the patient may need to escalate therapy to pyridostigmine or mestinon.  Past Medical History:  Diagnosis Date  .  Allergic rhinitis 09/20/2014  . Anemia, unspecified   . Arthritis   . Bladder neck obstruction   . Cataracts, bilateral   . Coronary atherosclerosis of unspecified type of vessel, native or graft   . Diminished hearing, bilateral   . Dysthymic disorder   . GERD (gastroesophageal reflux disease)   . Heart murmur    as a child  . Impaired glucose tolerance 12/02/2012  . Impotence of organic origin   . Lumbar radiculopathy   . Malignant melanoma of skin of upper limb, including shoulder ([place])   . Old myocardial infarction   . Personal history of malignant melanoma of skin   . Personal history of other diseases of digestive system   . Thoracic or lumbosacral neuritis or radiculitis, unspecified   . Unspecified essential hypertension     Patient Active Problem List   Diagnosis Date Noted  . HNP (herniated nucleus pulposus), lumbar 02/02/2016  . Spinal stenosis of lumbar region 01/24/2016  . Sciatica, right side 01/24/2016  . Prediabetes 01/24/2016  . Otitis, externa, infective 01/24/2016  . Left lumbar radiculopathy 01/15/2016  . PVC's (premature ventricular contractions) 05/24/2015  . Peripheral polyneuropathy 03/06/2015  . BPPV (benign paroxysmal positional vertigo) 03/06/2015  . Headache 01/18/2015  . Vertigo 01/18/2015  . Allergic rhinitis 09/20/2014  . DOE (dyspnea on exertion) 08/10/2014  . Severe dizziness 08/10/2014  . Acute diverticulitis 05/18/2014  . Diarrhea 05/18/2014  . LLQ pain 05/13/2014  . Chronic meniscal tear of knee 09/22/2013  . Primary localized osteoarthrosis, lower leg 09/22/2013  . Neck pain, bilateral 12/18/2012  . Headache(784.0) 12/18/2012  . Binocular visual disturbance 12/18/2012  . Epiretinal membrane 12/17/2012  . Status post intraocular lens  implant 12/17/2012  . Preventative health care 12/02/2012  . Low back pain 12/02/2012  . Palpitations 10/09/2010  . B12 deficiency 08/30/2010  . Hearing loss 08/30/2010  . Tinnitus 08/30/2010  .  Orthostatic hypotension 06/28/2009  . HYPERLIPIDEMIA TYPE IIB / III 05/15/2009  . CHEST PAIN UNSPECIFIED 11/23/2008  . Hypotension, unspecified 10/12/2008  . DIZZINESS 10/12/2008  . Shortness of breath 09/14/2008  . UNSPECIFIED ANEMIA 09/13/2008  . ANXIETY DEPRESSION 09/13/2008  . Old myocardial infarction 09/13/2008  . GASTROESOPHAGEAL REFLUX DISEASE, HX OF 09/13/2008  . LUMBAR RADICULOPATHY, LEFT 08/23/2008  . MELANOMA, SHOULDER 11/27/2006  . Essential hypertension 11/27/2006  . Coronary atherosclerosis 11/27/2006  . OBSTRUCTION, BLADDER NECK 11/27/2006  . ERECTILE DYSFUNCTION, ORGANIC 11/27/2006    Past Surgical History:  Procedure Laterality Date  . ARTHRODESIS  07/06/2002   of left long finger distal interphalangeal joint with Kirschner wire fixation X 3  . ATRIAL ABLATION SURGERY  05/04/2001   Dr. [name]  . CARDIAC CATHETERIZATION  09/05/2008   Revealing 4 of 4 patent grafts with native multivessel coronary artery disease, EF  of 60% without regional wall motion abnormalities.  Marland Kitchen CATARACT EXTRACTION, BILATERAL    . CHOLECYSTECTOMY    . COLONOSCOPY    . CORONARY ANGIOPLASTY  1993  . CORONARY ARTERY BYPASS GRAFT  10/17/2000   [name]. [name], Bruce     AFTER LIVER TRAUMA AND HAND SURGERY  . EYE SURGERY    . HERNIA REPAIR     BILATERAL  . LUMBAR LAMINECTOMY/DECOMPRESSION MICRODISCECTOMY N/A 02/02/2016   Procedure: MICRODISCECTOMY LUMBAR FOUR- LUMBAR FIVE;  Surgeon: Ashok Pall, MD;  Location: [facility];  Service: Neurosurgery;  Laterality: N/A;  MICRODISCECTOMY L4-L5  . LUMBAR LAMINECTOMY/DECOMPRESSION MICRODISCECTOMY Right 08/02/2016   Procedure: MICRODISCECTOMY LUMBAR FOUR- LUMBAR FIVE RIGHT;  Surgeon: Ashok Pall, MD;  Location: [facility];  Service: Neurosurgery;  Laterality: Right;  . punctured eardrum      to relieve blood build up   for ear infection  . TONSILLECTOMY         Home Medications    Prior to Admission medications     Medication Sig Start Date End Date Taking? Authorizing Provider  albuterol (PROVENTIL HFA;VENTOLIN HFA) 108 (90 BASE) MCG/ACT inhaler Inhale 2 puffs into the lungs every 6 (six) hours as needed for wheezing or shortness of breath. 08/10/14   Biagio Borg, MD  aspirin EC 81 MG tablet Take 81 mg by mouth at bedtime.     [provider]  finasteride (PROSCAR) 5 MG tablet Take 1 tablet (5 mg total) by mouth daily. 09/20/16   Biagio Borg, MD  fludrocortisone (FLORINEF) 0.1 MG tablet Take 1 tablet (0.1 mg total) by mouth daily. 08/26/16   Eileen Stanford, PA-C  losartan (COZAAR) 50 MG tablet Take 1 tablet (50 mg total) by mouth at bedtime. 09/17/16 12/16/16  Deboraha Sprang, MD  nitroGLYCERIN (NITROSTAT) 0.4 MG SL tablet Place 1 tablet (0.4 mg total) under the tongue every 5 (five) minutes as needed for chest pain (x 3 doses). Reported on 06/15/2015 11/09/15   Dorothy Spark, MD  nortriptyline (PAMELOR) 25 MG capsule Take 1 capsule (25 mg total) by mouth at bedtime. 04/15/16   Biagio Borg, MD  omeprazole (PRILOSEC) 20 MG capsule Take 20 mg by mouth at bedtime.    [provider]  rosuvastatin (CRESTOR) 10 MG tablet Take 10 mg by mouth at bedtime.  06/11/16   [provider]  sertraline (ZOLOFT) 50 MG tablet TAKE 1 TABLET DAILY Patient taking differently: TAKE 1 TABLET DAILY IN THE EVENING 01/24/16   Biagio Borg, MD  tamsulosin (FLOMAX) 0.4 MG CAPS capsule TAKE 1 CAPSULE DAILY 09/09/16   Biagio Borg, MD  traMADol (ULTRAM) 50 MG tablet Take 50 mg by mouth every 6 (six) hours as needed for pain. 07/15/16   [provider]    Family History Family History  Problem Relation Age of Onset  . Heart disease Father   . Ataxia Neg Hx   . Chorea Neg Hx   . Dementia Neg Hx   . Mental retardation Neg Hx   . Migraines Neg Hx   . Multiple sclerosis Neg Hx   . Neurofibromatosis Neg Hx   . Neuropathy Neg Hx   . Parkinsonism Neg Hx   . Seizures Neg Hx   . Stroke Neg  Hx     Social History Social History  Substance Use Topics  . Smoking status: Former Smoker    Quit date: 09/21/1955  . Smokeless tobacco: Never Used  . Alcohol use 0.0 oz/week     Comment: one or two ounces a week     Allergies   Iohexol; Ciprofloxacin; and Other   Review of Systems Review of Systems  Constitutional: Negative for chills and fever.  HENT: Negative for congestion.   Eyes: Negative for visual disturbance.  Respiratory: Negative for cough.   Cardiovascular: Negative for chest pain.  Gastrointestinal: Negative for abdominal pain, diarrhea, nausea and vomiting.  Genitourinary: Negative for dysuria, flank pain, frequency and urgency.  Musculoskeletal: Negative for back pain.  Skin: Negative.   Neurological: Positive for dizziness. Negative for syncope, weakness, light-headedness and headaches.     Physical Exam Updated Vital Signs BP (!) 177/106 (BP Location: Left Arm)   Pulse 75   Temp 98.1 F (36.7 C)   Resp 20   Ht 5\' 9"  (1.753 m)   Wt 78.5 kg (173 lb)   SpO2 97%   BMI 25.55 kg/m   Physical Exam  Constitutional: He is oriented to person, place, and time. He appears well-developed and well-nourished. No distress.  HENT:  Head: Normocephalic and atraumatic.  Mouth/Throat: Oropharynx is clear and moist.  Eyes: Conjunctivae and EOM are normal. Pupils are equal, round, and reactive to light. Right eye exhibits no discharge. Left eye exhibits no discharge. No scleral icterus.  Neck: Normal range of motion. Neck supple. No thyromegaly present.  Cardiovascular: Normal rate, regular rhythm, normal heart sounds and intact distal pulses.  Exam reveals no gallop and no friction rub.   No murmur heard. Pulmonary/Chest: Effort normal and breath sounds normal.  Abdominal: Soft. Bowel sounds are normal. He exhibits no distension. There is no tenderness. There is no rebound and no guarding.  Musculoskeletal: Normal range of motion. He exhibits no edema or  tenderness.  Lymphadenopathy:    He has no cervical adenopathy.  Neurological: He is alert and oriented to person, place, and time.  The patient is alert, attentive, and oriented x 3. Speech is clear. Cranial nerve II-VII grossly intact. Negative pronator drift. Sensation intact. Strength 5/5 in all extremities. Reflexes 2+ and symmetric at biceps, triceps, knees, and ankles. Rapid alternating movement and fine finger movements intact. Romberg is positive. Pt is mildly ataxic due to dizziness.    Skin: Skin is warm and dry. Capillary refill takes less than 2 seconds.  Nursing note and vitals reviewed.    ED Treatments / Results  Labs (all labs ordered are listed, but only abnormal results are displayed) Labs Reviewed  BASIC METABOLIC PANEL - Abnormal; Notable for the following:       Result Value   Glucose, Bld 132 (*)    All other components within normal limits  CBG MONITORING, ED - Abnormal; Notable for the following:    Glucose-Capillary 128 (*)    All other components within normal limits  CBC  URINALYSIS, ROUTINE W REFLEX MICROSCOPIC    EKG  EKG Interpretation  Date/Time:  Friday Sep 20 2016 18:13:45 EDT Ventricular Rate:  91 PR Interval:    QRS Duration: 90 QT Interval:  384 QTC Calculation: 473 R Axis:   -13 Text Interpretation:  Sinus rhythm No significant change since last tracing Confirmed by [facility] MD, PEDRO ([id number]) on 09/20/2016 6:16:12 PM       Radiology No results found.  Procedures Procedures (including critical care time)  Medications Ordered in ED Medications  sodium chloride 0.9 % bolus 500 mL (0 mLs Intravenous Stopped 09/20/16 1957)  sodium chloride 0.9 % bolus 500 mL (0 mLs Intravenous Stopped 09/20/16 2025)  sodium chloride 0.9 % bolus 500 mL (500 mLs Intravenous New Bag/Given 09/20/16 2102)     Initial Impression / Assessment and Plan / ED Course  I have reviewed the triage vital signs and the nursing notes.  Pertinent labs & imaging  results that were available during my care of the patient were reviewed by me and considered in my medical decision making (see chart for details).     Patient presents to the emergency Department today with complaints of orthostatic hypotension and dizziness. Patient states that this has been ongoing for the past few weeks has seen cardiology for the same but states that is getting worse. Was recently started on losartan for his hypertension but is still making him orthostatic and dizzy. Patient has no focal neuro deficits beside the positive romberg. Initial blood pressures reveal 153/95 whilte lying and 103/66 while standing. Patient reports dizziness upon standing. He was given 1.5 L fluid bolus.  All the lab work is benign. EKG is normal sinus rhythm.  Spoke with Dr. Aundra Dubin with cardiology to discuss patient. Feels that patient's symptoms are likely worsened by the losartan. He recommends patient stopping the losartan taking his blood pressures at regular intervals until he can follow-up with [name] in the office.    Second set of orthostatics are 177/106 lying to 147/92 while standing.  the patient remains symptomatic. However patient has no other neuro deficits. Spoke with Dr.  Myna Hidalgo  with [facility] concerning ongoing symptomatic orthostatic hypotension. States that this is not a hospital admission and needs to reconsult cardiology.   Patient's blood pressure remained labile. Resting blood pressure is 202/111 and both arms. Patient now complains of a headache. Had Dr. Leonette Monarch reassess patient and feels the patient will likely need admission. He spoke back to Dr. Aundra Dubin who states the patient needs to be admitted but can be admitted to the hospital service.  Spoke back with Dr. Myna Hidalgo who does not believe this is a hospital admission and  states that we need to control his blood pressure in the ED and then he can go home and be careful standing up until he can follow up with  cardiology next week. Dr. Leonette Monarch dicussed with Dr. Myna Hidalgo.  Feel patient needs to be admitted given ongoing symptoms. Dr. Aundra Dubin was reconsult still feels patient needs to be admitted. Dr.  Myna Hidalgo reconsult  and agrees to admission. Patient is currently hemodynamically stable and in no acute distress at this time except for a mild headache.   Final Clinical Impressions(s) / ED Diagnoses   Final diagnoses:  Orthostatic hypotension    New Prescriptions New Prescriptions   No medications on file     Aaron Edelman 09/21/16 2229

## 2016-09-20 NOTE — Telephone Encounter (Signed)
Pt was informed and will follow up with his urologist to see if he needs to continue taking it or if it has been stopped.

## 2016-09-20 NOTE — ED Triage Notes (Signed)
Pt states difficulty walking d/t dizziness x 1 week.  Positive orthostatics per [facility].   Pt was just started on Losartan 3 days ago.  VS 162/98 sitting and 110 pap standing.  HR 92, RR 18.  AO x 4.  EKG unremarkable, per [facility] - nsr with pvc's.

## 2016-09-21 DIAGNOSIS — F418 Other specified anxiety disorders: Secondary | ICD-10-CM

## 2016-09-21 DIAGNOSIS — I951 Orthostatic hypotension: Secondary | ICD-10-CM | POA: Diagnosis not present

## 2016-09-21 LAB — BASIC METABOLIC PANEL
Anion gap: 5 (ref 5–15)
BUN: 11 mg/dL (ref 6–20)
CO2: 26 mmol/L (ref 22–32)
Calcium: 8.7 mg/dL — ABNORMAL LOW (ref 8.9–10.3)
Chloride: 110 mmol/L (ref 101–111)
Creatinine, Ser: 0.77 mg/dL (ref 0.61–1.24)
GFR calc Af Amer: >60 mL/min (ref >60)
GFR calc non Af Amer: >60 mL/min (ref >60)
Glucose, Bld: 170 mg/dL — ABNORMAL HIGH (ref 65–99)
Potassium: 3.8 mmol/L (ref 3.5–5.1)
Sodium: 141 mmol/L (ref 135–145)

## 2016-09-21 LAB — CBC WITH DIFFERENTIAL/PLATELET
Basophils Absolute: 0 10*3/uL (ref 0.0–0.1)
Basophils Relative: 0 %
Eosinophils Absolute: 0.3 10*3/uL (ref 0.0–0.7)
Eosinophils Relative: 4 %
HCT: 37.9 % — ABNORMAL LOW (ref 39.0–52.0)
Hemoglobin: 12.9 g/dL — ABNORMAL LOW (ref 13.0–17.0)
Lymphocytes Relative: 21 %
Lymphs Abs: 1.6 10*3/uL (ref 0.7–4.0)
MCH: 29.5 pg (ref 26.0–34.0)
MCHC: 34 g/dL (ref 30.0–36.0)
MCV: 86.7 fL (ref 78.0–100.0)
Monocytes Absolute: 0.8 10*3/uL (ref 0.1–1.0)
Monocytes Relative: 10 %
Neutro Abs: 5 10*3/uL (ref 1.7–7.7)
Neutrophils Relative %: 65 %
Platelets: 148 10*3/uL — ABNORMAL LOW (ref 150–400)
RBC: 4.37 MIL/uL (ref 4.22–5.81)
RDW: 14 % (ref 11.5–15.5)
WBC: 7.6 10*3/uL (ref 4.0–10.5)

## 2016-09-21 MED ORDER — LOSARTAN POTASSIUM 25 MG PO TABS
25.0000 mg | ORAL_TABLET | Freq: Every day | ORAL | Status: DC
  Administered 2016-09-21: 25 mg via ORAL
  Filled 2016-09-21: qty 1

## 2016-09-21 NOTE — Plan of Care (Signed)
Problem: Safety: Goal: Ability to remain free from injury will improve Outcome: Progressing Bed in low position. Bed alarm in place. Call bell within reach. Reminded patient to call with needs. Staff continues safety rounds every hour.

## 2016-09-21 NOTE — Evaluation (Signed)
Physical Therapy Evaluation Patient Details Name: Troy Combs MRN: [id number] DOB: 02/23/1932 Today's Date: 09/21/2016   History of Present Illness  Pt is an 83 yo male admitted through the ED under observation on 09/20/16 with orthostasis. Pt had a significant drop in BP with postional changes in ED resulting in increased symptoms. Pt's symptoms were resolved in ED when given saline. PMH significant for HTN, Depression with anxiety, BPH, lumbar stenosis s/p decompression, CAD, CABG.   Clinical Impression  Pt presents with the above diagnosis and below deficits for therapy evaluation. Prior to admission, pt lived with his wife in a single level townhome and is a Probation officer. 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 Pt was receiving HHPT prior to recent admission and continued to have issues with his BP dropping which resulted in current hospitalization. Pt is able to perform bed mobs, transfers and gait at close to baseline prior to admission. Orthostatics are taken an noted in chart with no significant decrease in BP levels. Pt will benefit from continuation of HHPT at discharge in order to assist with return to PLOF.     Follow Up Recommendations Home health PT    Equipment Recommendations  None recommended by PT    Recommendations for Other Services       Precautions / Restrictions Precautions Precautions: Fall Precaution Comments: watch BP Restrictions Weight Bearing Restrictions: No      Mobility  Bed Mobility Overal bed mobility: Needs Assistance Bed Mobility: Rolling;Sidelying to Sit Rolling: Supervision Sidelying to sit: Mod assist       General bed mobility comments: Mod A to bring trunk upright at EOB.   Transfers Overall transfer level: Needs assistance Equipment used: Rolling walker (2 wheeled) Transfers: Sit to/from Stand Sit to Stand: Min guard         General transfer comment: Min guard for safety from  EOB  Ambulation/Gait Ambulation/Gait assistance: Min guard Ambulation Distance (Feet): 75 Feet Assistive device: Rolling walker (2 wheeled) Gait Pattern/deviations: Step-through pattern;Decreased step length - right;Decreased step length - left;Staggering right Gait velocity: decreased Gait velocity interpretation: Below normal speed for age/gender General Gait Details: Minimal staggering to right with increased gait distance.   Stairs            Wheelchair Mobility    Modified Rankin (Stroke Patients Only)       Balance Overall balance assessment: Needs assistance Sitting-balance support: No upper extremity supported;Feet supported Sitting balance-Leahy Scale: Good     Standing balance support: Single extremity supported;Bilateral upper extremity supported;During functional activity Standing balance-Leahy Scale: Fair Standing balance comment: relies on at least 1 hand assist                              Pertinent Vitals/Pain Pain Assessment: No/denies pain    Home Living Family/patient expects to be discharged to:: Private residence Living Arrangements: Spouse/significant other Available Help at Discharge: Family;Available 24 hours/day Type of Home: Other(Comment) (townhouse) Home Access: Stairs to enter Entrance Stairs-Rails: Right;Left;Can reach both Entrance Stairs-Number of Steps: 3 Home Layout: One level Home Equipment: Grab bars - tub/shower;Shower seat - built in;Hand held shower head      Prior Function Level of Independence: Independent         Comments: was becoming more independent prior to admission with HHPT     Hand Dominance   Dominant Hand: Right    Extremity/Trunk Assessment   Upper Extremity  Assessment Upper Extremity Assessment: Overall WFL for tasks assessed    Lower Extremity Assessment Lower Extremity Assessment: Generalized weakness    Cervical / Trunk Assessment Cervical / Trunk Assessment: Normal   Communication   Communication: No difficulties  Cognition Arousal/Alertness: Awake/alert Behavior During Therapy: WFL for tasks assessed/performed Overall Cognitive Status: Within Functional Limits for tasks assessed                                        General Comments      Exercises     Assessment/Plan    PT Assessment All further PT needs can be met in the next venue of care  PT Problem List Decreased strength;Decreased activity tolerance;Decreased balance;Decreased mobility;Decreased knowledge of use of DME;Pain       PT Treatment Interventions      PT Goals (Current goals can be found in the Care Plan section)  Acute Rehab PT Goals Patient Stated Goal: to get back home and back to being independent PT Goal Formulation: With patient Time For Goal Achievement: 10/05/16 Potential to Achieve Goals: Good    Frequency     Barriers to discharge        Co-evaluation               AM-PAC PT "6 Clicks" Daily Activity  Outcome Measure Difficulty turning over in bed (including adjusting bedclothes, sheets and blankets)?: None Difficulty moving from lying on back to sitting on the side of the bed? : Total Difficulty sitting down on and standing up from a chair with arms (e.g., wheelchair, bedside commode, etc,.)?: A Little Help needed moving to and from a bed to chair (including a wheelchair)?: A Little Help needed walking in hospital room?: A Little Help needed climbing 3-5 steps with a railing? : A Lot 6 Click Score: 16    End of Session Equipment Utilized During Treatment: Gait belt Activity Tolerance: Patient tolerated treatment well Patient left: with call bell/phone within reach;in bed;with bed alarm set;with family/visitor present Nurse Communication: Mobility status PT Visit Diagnosis: Muscle weakness (generalized) (M62.81);Difficulty in walking, not elsewhere classified (R26.2)    Time: 1561-5379 PT Time Calculation (min) (ACUTE  ONLY): 26 min   Charges:   PT Evaluation $PT Eval Moderate Complexity: 1 Procedure PT Treatments $Gait Training: 8-22 mins   PT G Codes:   PT G-Codes **NOT FOR INPATIENT CLASS** Functional Assessment Tool Used: AM-PAC 6 Clicks Basic Mobility;Clinical judgement Functional Limitation: Mobility: Walking and moving around Mobility: Walking and Moving Around Current Status (K3276): At least 40 percent but less than 60 percent impaired, limited or restricted Mobility: Walking and Moving Around Goal Status [phone]): At least 1 percent but less than 20 percent impaired, limited or restricted    Scheryl Marten PT, DPT  [phone]   Shanon Rosser 09/21/2016, 4:40 PM

## 2016-09-21 NOTE — Progress Notes (Signed)
PROGRESS NOTE    Troy Combs  LPF:[id number] DOB: 11-Jul-1931 DOA: 09/20/2016 PCP: Biagio Borg, MD   Brief Narrative:  83 y.o. male with medical history significant for hypertension, depression with anxiety, BPH, lumbar spinal stenosis status post decompression, and CAD status post CABG who presents the emergency department for evaluation of lightheadedness upon standing and blood pressure lability.  Patient is currently being evaluated for symptomatic orthostatic hypotension   Assessment & Plan:   Principal Problem:   Orthostasis - I agree with initial plan set forth in history of present illness - Cardiologists have him on ARB. Will decrease dose and place TED hose - Have physical therapy reassess patient  Active Problems:   ANXIETY DEPRESSION - Continue Zoloft - Patient is also on Pamelor    Coronary atherosclerosis - We'll continue aspirin and statin. Currently chest pain-free    Spinal stenosis of lumbar region Continue symptomatic treatment-     DVT prophylaxis: Lovenox Code Status: Full Family Communication: None at bedside Disposition Plan: Pending improvement in condition   Consultants:   None   Procedures: None   Antimicrobials: None   Subjective: Pt has no new complaints, still reporting he gets dizzy when he stands  Objective: Vitals:   09/20/16 2255 09/21/16 0033 09/21/16 0350 09/21/16 0500  BP: (!) 196/110 (!) 164/77 (!) 183/89 (!) 152/79  Pulse: 80 71 (!) 18 (!) 18  Resp: 20  18 18   Temp: 98.6 F (37 C)  98.2 F (36.8 C) 98.5 F (36.9 C)  TempSrc: Oral  Oral Oral  SpO2: 96%  95% 96%  Weight: 77.4 kg (170 lb 11.2 oz)     Height: 5\' 9"  (1.753 m)       Intake/Output Summary (Last 24 hours) at 09/21/16 1631 Last data filed at 09/21/16 1200  Gross per 24 hour  Intake             1500 ml  Output             1925 ml  Net             -425 ml   Filed Weights   09/20/16 1742 09/20/16 2255  Weight: 78.5 kg (173 lb) 77.4 kg (170  lb 11.2 oz)    Examination:  General exam: Appears calm and comfortable, in nad Respiratory system: Clear to auscultation. Respiratory effort normal. Cardiovascular system: S1 & S2 heard, RRR.  Gastrointestinal system: Abdomen is nondistended, soft and nontender. No organomegaly or masses felt. Normal bowel sounds heard. Central nervous system: Alert and oriented. No focal neurological deficits. Extremities: Symmetric 5 x 5 power. Skin: No rashes, lesions or ulcers, on limited exam. Psychiatry: Judgement and insight appear normal. Mood & affect appropriate.     Data Reviewed: I have personally reviewed following labs and imaging studies  CBC:  Recent Labs Lab 09/20/16 1820 09/21/16 0209  WBC 8.5 7.6  NEUTROABS  --  5.0  HGB 14.6 12.9*  HCT 42.3 37.9*  MCV 85.5 86.7  PLT 184 532*   Basic Metabolic Panel:  Recent Labs Lab 09/20/16 1820 09/21/16 0209  NA 138 141  K 3.8 3.8  CL 108 110  CO2 22 26  GLUCOSE 132* 170*  BUN 15 11  CREATININE 0.70 0.77  CALCIUM 9.5 8.7*   GFR: Estimated Creatinine Clearance: 71.2 mL/min (by C-G formula based on SCr of 0.77 mg/dL). Liver Function Tests: No results for input(s): AST, ALT, ALKPHOS, BILITOT, PROT, ALBUMIN in the last 168 hours. No results  for input(s): LIPASE, AMYLASE in the last 168 hours. No results for input(s): AMMONIA in the last 168 hours. Coagulation Profile: No results for input(s): INR, PROTIME in the last 168 hours. Cardiac Enzymes: No results for input(s): CKTOTAL, CKMB, CKMBINDEX, TROPONINI in the last 168 hours. BNP (last 3 results) No results for input(s): PROBNP in the last 8760 hours. HbA1C: No results for input(s): HGBA1C in the last 72 hours. CBG:  Recent Labs Lab 09/20/16 1823  GLUCAP 128*   Lipid Profile: No results for input(s): CHOL, HDL, LDLCALC, TRIG, CHOLHDL, LDLDIRECT in the last 72 hours. Thyroid Function Tests: No results for input(s): TSH, T4TOTAL, FREET4, T3FREE, THYROIDAB in the  last 72 hours. Anemia Panel: No results for input(s): VITAMINB12, FOLATE, FERRITIN, TIBC, IRON, RETICCTPCT in the last 72 hours. Sepsis Labs: No results for input(s): PROCALCITON, LATICACIDVEN in the last 168 hours.  No results found for this or any previous visit (from the past 240 hour(s)).       Radiology Studies: No results found.      Scheduled Meds: . aspirin EC  81 mg Oral QHS  . enoxaparin (LOVENOX) injection  40 mg Subcutaneous Daily  . losartan  50 mg Oral QHS  . nortriptyline  25 mg Oral QHS  . rosuvastatin  10 mg Oral QHS  . sertraline  50 mg Oral QPM  . sodium chloride flush  3 mL Intravenous Q12H   Continuous Infusions:   LOS: 0 days    Time spent: > 35 minutes  Velvet Bathe, MD [facility] Pager [phone]  If 7PM-7AM, please contact night-coverage [email or web address] Password [email or web address] 09/21/2016, 4:31 PM

## 2016-09-22 ENCOUNTER — Telehealth: Payer: Self-pay | Admitting: Cardiology

## 2016-09-22 DIAGNOSIS — I951 Orthostatic hypotension: Secondary | ICD-10-CM | POA: Diagnosis not present

## 2016-09-22 LAB — GLUCOSE, CAPILLARY: Glucose-Capillary: 120 mg/dL — ABNORMAL HIGH (ref 65–99)

## 2016-09-22 MED ORDER — LOSARTAN POTASSIUM 25 MG PO TABS: 25.0000 mg | ORAL_TABLET | Freq: Every day | ORAL | 0 refills | Status: DC

## 2016-09-22 NOTE — Discharge Summary (Signed)
Physician Discharge Summary  Troy Combs ACZ:[id number] DOB: 05-30-1931 DOA: 09/20/2016  PCP: Biagio Borg, MD  Admit date: 09/20/2016 Discharge date: 09/22/2016  Time spent: > 35 minutes  Recommendations for Outpatient Follow-up:  1. Decide when to continue BPH medications. Held due to orthostatic hypotension 2. Losartan dose cut from 50 to 25   Discharge Diagnoses:  Principal Problem:   Orthostasis Active Problems:   ANXIETY DEPRESSION   Coronary atherosclerosis   Spinal stenosis of lumbar region   Depression with anxiety   Discharge Condition: stable  Diet recommendation: Heart healthy  Filed Weights   09/20/16 1742 09/20/16 2255 09/22/16 0439  Weight: 78.5 kg (173 lb) 77.4 kg (170 lb 11.2 oz) 75 kg (165 lb 4.8 oz)    History of present illness:  83 y.o. male with medical history significant for hypertension, depression with anxiety, BPH, lumbar spinal stenosis status post decompression, and CAD status post CABG who presents the emergency department for evaluation of lightheadedness upon standing and blood pressure lability.  Hospital Course:  Orthostatic Hypotension - Improved after discontinuing BPH medications and cutting losartan dose down  Otherwise for other known medical problems continue prior to admission home medication regimen  Procedures:  None  Consultations:  None  Discharge Exam: Vitals:   09/22/16 0439 09/22/16 1241  BP: (!) 156/86 (!) 145/82  Pulse: 75 82  Resp: 18   Temp: 97.8 F (36.6 C)     General: Pt in nad, alert and awake Cardiovascular: rrr, no rubs Respiratory: no increased wob, no wheezes  Discharge Instructions   Discharge Instructions    Call MD for:  temperature >100.4    Complete by:  As directed    Diet - low sodium heart healthy    Complete by:  As directed    Discharge instructions    Complete by:  As directed    Please follow-up with your primary care physician to decide when to continue your  medications for your benign prostatic hyperplasia   Increase activity slowly    Complete by:  As directed      Current Discharge Medication List    CONTINUE these medications which have CHANGED   Details  losartan (COZAAR) 25 MG tablet Take 1 tablet (25 mg total) by mouth at bedtime. Qty: 30 tablet, Refills: 0      CONTINUE these medications which have NOT CHANGED   Details  albuterol (PROVENTIL HFA;VENTOLIN HFA) 108 (90 BASE) MCG/ACT inhaler Inhale 2 puffs into the lungs every 6 (six) hours as needed for wheezing or shortness of breath. Qty: 1 Inhaler, Refills: 11    aspirin EC 81 MG tablet Take 81 mg by mouth at bedtime.     nitroGLYCERIN (NITROSTAT) 0.4 MG SL tablet Place 1 tablet (0.4 mg total) under the tongue every 5 (five) minutes as needed for chest pain (x 3 doses). Reported on 06/15/2015 Qty: 25 tablet, Refills: 6   Associated Diagnoses: Essential hypertension; Dizziness and giddiness; Mixed hyperlipidemia    nortriptyline (PAMELOR) 25 MG capsule Take 1 capsule (25 mg total) by mouth at bedtime. Qty: 90 capsule, Refills: 2    omeprazole (PRILOSEC) 20 MG capsule Take 20 mg by mouth daily as needed (acid reflux).     rosuvastatin (CRESTOR) 10 MG tablet Take 10 mg by mouth at bedtime.     sertraline (ZOLOFT) 50 MG tablet TAKE 1 TABLET DAILY Qty: 90 tablet, Refills: 3      STOP taking these medications     finasteride (PROSCAR)  5 MG tablet      tamsulosin (FLOMAX) 0.4 MG CAPS capsule        Allergies  Allergen Reactions  . Iohexol Other (See Comments)     Code: HIVES, Desc: PATENT STATES HE IS ALLERGIC TO IV DYE 09/14/08/RM, Onset Date: 79150569   . Ciprofloxacin Hives and Rash    "Patient unsure" just know he has a reaction to med Medical record indicates allergic reaction  . Other     UNSPECIFIED REACTION  Angioplasty dye pt.>> Pt only recalls Had "very bad reaction"      The results of significant diagnostics from this hospitalization (including  imaging, microbiology, ancillary and laboratory) are listed below for reference.    Significant Diagnostic Studies: No results found.  Microbiology: No results found for this or any previous visit (from the past 240 hour(s)).   Labs: Basic Metabolic Panel:  Recent Labs Lab 09/20/16 1820 09/21/16 0209  NA 138 141  K 3.8 3.8  CL 108 110  CO2 22 26  GLUCOSE 132* 170*  BUN 15 11  CREATININE 0.70 0.77  CALCIUM 9.5 8.7*   Liver Function Tests: No results for input(s): AST, ALT, ALKPHOS, BILITOT, PROT, ALBUMIN in the last 168 hours. No results for input(s): LIPASE, AMYLASE in the last 168 hours. No results for input(s): AMMONIA in the last 168 hours. CBC:  Recent Labs Lab 09/20/16 1820 09/21/16 0209  WBC 8.5 7.6  NEUTROABS  --  5.0  HGB 14.6 12.9*  HCT 42.3 37.9*  MCV 85.5 86.7  PLT 184 148*   Cardiac Enzymes: No results for input(s): CKTOTAL, CKMB, CKMBINDEX, TROPONINI in the last 168 hours. BNP: BNP (last 3 results) No results for input(s): BNP in the last 8760 hours.  ProBNP (last 3 results) No results for input(s): PROBNP in the last 8760 hours.  CBG:  Recent Labs Lab 09/20/16 1823 09/22/16 0642  GLUCAP 128* 120*     Signed:  Velvet Bathe MD.  [facility] 09/22/2016, 1:14 PM

## 2016-09-22 NOTE — Telephone Encounter (Signed)
Patient's wife calling stating that her husband took his BP tonight and it is elevated at 169/188mmHg.  He was discharged from the hospital earlier today and has been feeling fine.  He sat down and took his BP it was 160/79mmHg and then he stood up and it was 95/37mmHg and he got dizzy.  He sat back down again and it was 169/163mmHg.  He is not feeling bad at this time.  He has not taken his evening meds which include Losartan 25mg .  I instructed him to go ahead and take his PM meds and continue on wear the compression hose.  If he starts to have low BP sitting instead of just standing to call.  I also encouraged him to recheck his BP in the am and with lying, sitting and standing and call the on call Cardiologist in the am for further med instruction.

## 2016-09-22 NOTE — Progress Notes (Signed)
Discharge instructions printed and reviewed with patient and spouse, and copy given for them to take home. All questions addressed at this time. New and old prescriptions reviewed. IV and tele box removed. Room searched for patient belongings, and confirmed with patient that all valuables were accounted for. Family assisted patient to dress, then staff escorted patient to discharge via wheelchair.

## 2016-09-23 ENCOUNTER — Telehealth: Payer: Self-pay | Admitting: Physician Assistant

## 2016-09-23 ENCOUNTER — Other Ambulatory Visit: Payer: Self-pay | Admitting: Physician Assistant

## 2016-09-23 DIAGNOSIS — I951 Orthostatic hypotension: Secondary | ICD-10-CM

## 2016-09-23 MED ORDER — MIDODRINE HCL 5 MG PO TABS: 5.0000 mg | ORAL_TABLET | ORAL | 3 refills | Status: DC

## 2016-09-23 NOTE — Telephone Encounter (Signed)
Mr. Pilger was discharged from the hospital 09/22/2016. He had been admitted with orthostatic hypotension. His losartan was cut from 50 mg down to 25 mg. Both of his alpha blockers for his prostate were discontinued (Proscar and Flomax).  Yesterday before he left the hospital, his orthostatic vital signs were negative. Today, his wife took his blood pressure lying sitting and standing. His blood pressure lying was 161/93, she wasn't sure if the next one was lying or sitting but it was 142/92 and his standing blood pressure was 79/54.  I discussed the situation with [name], who had seen him in consult on 05/22.  [name] recommended increasing his losartan back to 50 mg daily for hypertension. He is to take this at bedtime. [name] also recommended restarting his Flomax. The patient and his wife were both in agreement that he would have significant problems urinating if he were not on one or the other and may have problems because he is not on both.  He has an abdominal binder and is using it. It is also recommended that he elevate the head of his bed 6 inches. It is my impression that elevating his head only but leaving his legs flat is not adequate. I explained that I felt he should be in reverse Trendelenburg, she will discuss this with Dr. Jenny Reichmann.  [name] recommends starting ProAmatine 5 mg every 4 hours while awake. I sent this prescription in to the pharmacy. [name] also admitted that he did not have time to manage these patients by himself but would be happy to assist Dr. Jenny Reichmann with management.  All of these instructions were explained in detail to his wife who wrote things down and indicates understanding.  Lenoard Aden 09/23/2016 12:57 PM Beeper [phone]

## 2016-09-24 ENCOUNTER — Encounter: Payer: Self-pay | Admitting: Internal Medicine

## 2016-09-24 ENCOUNTER — Telehealth: Payer: Self-pay | Admitting: *Deleted

## 2016-09-24 ENCOUNTER — Ambulatory Visit (INDEPENDENT_AMBULATORY_CARE_PROVIDER_SITE_OTHER): Payer: Medicare Other | Admitting: Internal Medicine

## 2016-09-24 ENCOUNTER — Telehealth: Payer: Self-pay | Admitting: Internal Medicine

## 2016-09-24 VITALS — BP 144/88 | HR 86 | Ht 69.0 in | Wt 159.0 lb

## 2016-09-24 DIAGNOSIS — N401 Enlarged prostate with lower urinary tract symptoms: Secondary | ICD-10-CM | POA: Diagnosis not present

## 2016-09-24 DIAGNOSIS — R351 Nocturia: Secondary | ICD-10-CM | POA: Diagnosis not present

## 2016-09-24 DIAGNOSIS — F341 Dysthymic disorder: Secondary | ICD-10-CM | POA: Diagnosis not present

## 2016-09-24 DIAGNOSIS — I951 Orthostatic hypotension: Secondary | ICD-10-CM | POA: Diagnosis not present

## 2016-09-24 DIAGNOSIS — R7303 Prediabetes: Secondary | ICD-10-CM | POA: Diagnosis not present

## 2016-09-24 DIAGNOSIS — I10 Essential (primary) hypertension: Secondary | ICD-10-CM

## 2016-09-24 MED ORDER — TAMSULOSIN HCL 0.4 MG PO CAPS: 0.4000 mg | ORAL_CAPSULE | Freq: Every day | ORAL | 3 refills | Status: DC

## 2016-09-24 MED ORDER — FINASTERIDE 5 MG PO TABS: 5.0000 mg | ORAL_TABLET | Freq: Every day | ORAL | 3 refills | Status: DC

## 2016-09-24 MED ORDER — LOSARTAN POTASSIUM 50 MG PO TABS: 50.0000 mg | ORAL_TABLET | Freq: Every day | ORAL | 3 refills | Status: DC

## 2016-09-24 NOTE — Telephone Encounter (Signed)
Needs verbals for physical therapy for 3x5, starting this week.

## 2016-09-24 NOTE — Patient Instructions (Addendum)
Ok to increase the losartan to 50 mg  Please continue all other medications as before, including the Midodrine at 5 mg per day, but call in 7 days if not improved for consideration for 10 mg  OK to restart the flomax and finasteride  Please continue the compression stockings and binder  Please have the pharmacy call with any other refills you may need.  Please keep your appointments with your specialists as you may have planned  OK to continue to work with PT

## 2016-09-24 NOTE — Telephone Encounter (Signed)
Called Caitlyn and gave her the ok for verbal orders.

## 2016-09-24 NOTE — Telephone Encounter (Signed)
Transition Care Management Follow-up Telephone Call   Date discharged? 09/22/16   How have you been since you were released from the hospital? Spoke w/wife she states he is still the same. She check BP this am (sitting) 163/99, then at 10:10 recheck BP was  122/84 (sitting) then check standing it drop 96/76   Do you understand why you were in the hospital? YES   Do you understand the discharge instructions? YES   Where were you discharged to? Home   Items Reviewed:  Medications reviewed: YES, wife states after speaking w/cardiology on yesterday they added Midodrine and he has been taking  Allergies reviewed: YES  Dietary changes reviewed: YES  Referrals reviewed: No referral   Functional Questionnaire:   Activities of Daily Living (ADLs):   She states he are independent in the following: bathing and hygiene, feeding, continence, grooming, toileting and dressing States he require assistance with the following: ambulation, gets very dizzy when walking   Any transportation issues/concerns?: NO   Any patient concerns? NO   Confirmed importance and date/time of follow-up visits scheduled YES, wife stated PT had called made appt for this afternoon at 5:00. Advise wife to keep appt  Provider Appointment booked with Dr. Jenny Reichmann  Confirmed with patient if condition begins to worsen call PCP or go to the ER.  Patient was given the office number and encouraged to call back with question or concerns.  : YES

## 2016-09-24 NOTE — Telephone Encounter (Signed)
Ok for verbal 

## 2016-09-24 NOTE — Progress Notes (Signed)
Subjective:    Patient ID: Troy Combs, male    DOB: March 12, 1932, 83 y.o.   MRN: [id number]  HPI    Here to f/u, overall doing farily well after recent hospn with orthostasis not responding to volume repletion, leading to reduction of losartan from 50 to 25 mg, and holding of flomax and finasteride.  Has been seen per cardiology/Dr Caryl Comes.  Midodrine added at 5 mg which he has only been able to take starting yesterday.  No obvious intolerances so far.  Chart review indicates a note per Cards/Barett PA-C summarizing recent hx, and noting to all concerned information regarding persistent orthostasis post dc at home.  Dr Caryl Comes has recommended incresae losartan back to 50 mg as this is generally well tolerated and at times SBP can reach 200.  Ok to restart BPH meds/alpha blocker.  Pt is wearing compression stocking, wearing abd binder. PT is involved and wife with him today notes PT has remarked he seemed to be getting a bit worse in ambulatory ability with walker instead of slow improvement, though I dont have records of this.  Pt denies chest pain, increased sob or doe, wheezing, orthopnea, PND, increased LE swelling, palpitations, or syncope.  Pt has no new complaints. Has some urinary slowing off the flomax and eager to restart the prostate meds   Pt denies fever, night sweats, loss of appetite, though has lost wt recently. Pt denies new neurological symptoms such as new headache, or facial or extremity weakness or numbness   Pt denies polydipsia, polyuria  Wt Readings from Last 3 Encounters:  09/24/16 159 lb (72.1 kg)  09/22/16 165 lb 4.8 oz (75 kg)  09/17/16 173 lb (78.5 kg)   Past Medical History:  Diagnosis Date  . Allergic rhinitis 09/20/2014  . Anemia, unspecified   . Arthritis   . Bladder neck obstruction   . Cataracts, bilateral   . Coronary atherosclerosis of unspecified type of vessel, native or graft   . Diminished hearing, bilateral   . Dysthymic disorder   . GERD  (gastroesophageal reflux disease)   . Heart murmur    as a child  . Impaired glucose tolerance 12/02/2012  . Impotence of organic origin   . Lumbar radiculopathy   . Malignant melanoma of skin of upper limb, including shoulder ([place])   . Old myocardial infarction   . Personal history of malignant melanoma of skin   . Personal history of other diseases of digestive system   . Thoracic or lumbosacral neuritis or radiculitis, unspecified   . Unspecified essential hypertension    Past Surgical History:  Procedure Laterality Date  . ARTHRODESIS  07/06/2002   of left long finger distal interphalangeal joint with Kirschner wire fixation X 3  . ATRIAL ABLATION SURGERY  05/04/2001   Dr. [name]  . CARDIAC CATHETERIZATION  09/05/2008   Revealing 4 of 4 patent grafts with native multivessel coronary artery disease, EF  of 60% without regional wall motion abnormalities.  Marland Kitchen CATARACT EXTRACTION, BILATERAL    . CHOLECYSTECTOMY    . COLONOSCOPY    . CORONARY ANGIOPLASTY  1993  . CORONARY ARTERY BYPASS GRAFT  10/17/2000   [name]. [name], [place]     AFTER LIVER TRAUMA AND HAND SURGERY  . EYE SURGERY    . HERNIA REPAIR     BILATERAL  . LUMBAR LAMINECTOMY/DECOMPRESSION MICRODISCECTOMY N/A 02/02/2016   Procedure: MICRODISCECTOMY LUMBAR FOUR- LUMBAR FIVE;  Surgeon: Ashok Pall, MD;  Location: [facility]  OR;  Service: Neurosurgery;  Laterality: N/A;  MICRODISCECTOMY L4-L5  . LUMBAR LAMINECTOMY/DECOMPRESSION MICRODISCECTOMY Right 08/02/2016   Procedure: MICRODISCECTOMY LUMBAR FOUR- LUMBAR FIVE RIGHT;  Surgeon: Ashok Pall, MD;  Location: [place];  Service: Neurosurgery;  Laterality: Right;  . punctured eardrum      to relieve blood build up   for ear infection  . TONSILLECTOMY      reports that he quit smoking about 61 years ago. He has never used smokeless tobacco. He reports that he drinks alcohol. He reports that he does not use drugs. family history includes Heart disease in his  father. Allergies  Allergen Reactions  . Iohexol Other (See Comments)     Code: HIVES, Desc: PATENT STATES HE IS ALLERGIC TO IV DYE 09/14/08/RM, Onset Date: 90300923   . Ciprofloxacin Hives and Rash    "Patient unsure" just know he has a reaction to med Medical record indicates allergic reaction  . Other     UNSPECIFIED REACTION  Angioplasty dye pt.>> Pt only recalls Had "very bad reaction"   Current Outpatient Prescriptions on File Prior to Visit  Medication Sig Dispense Refill  . albuterol (PROVENTIL HFA;VENTOLIN HFA) 108 (90 BASE) MCG/ACT inhaler Inhale 2 puffs into the lungs every 6 (six) hours as needed for wheezing or shortness of breath. 1 Inhaler 11  . aspirin EC 81 MG tablet Take 81 mg by mouth at bedtime.     . midodrine (PROAMATINE) 5 MG tablet Take 1 tablet (5 mg total) by mouth every 4 (four) hours while awake. 150 tablet 3  . nitroGLYCERIN (NITROSTAT) 0.4 MG SL tablet Place 1 tablet (0.4 mg total) under the tongue every 5 (five) minutes as needed for chest pain (x 3 doses). Reported on 06/15/2015 25 tablet 6  . nortriptyline (PAMELOR) 25 MG capsule Take 1 capsule (25 mg total) by mouth at bedtime. 90 capsule 2  . omeprazole (PRILOSEC) 20 MG capsule Take 20 mg by mouth daily as needed (acid reflux).     . rosuvastatin (CRESTOR) 10 MG tablet Take 10 mg by mouth at bedtime.     . sertraline (ZOLOFT) 50 MG tablet TAKE 1 TABLET DAILY (Patient taking differently: TAKE 1 TABLET DAILY IN THE EVENING) 90 tablet 3   No current facility-administered medications on file prior to visit.    Review of Systems  Constitutional: Negative for other unusual diaphoresis or sweats HENT: Negative for ear discharge or swelling Eyes: Negative for other worsening visual disturbances Respiratory: Negative for stridor or other swelling  Gastrointestinal: Negative for worsening distension or other blood Genitourinary: Negative for retention or other urinary change Musculoskeletal: Negative for other  MSK pain or swelling Skin: Negative for color change or other new lesions Neurological: Negative for worsening tremors and other numbness  Psychiatric/Behavioral: Negative for worsening agitation or other fatigue All other system neg per pt    Objective:   Physical Exam BP (!) 144/88   Pulse 86   Ht 5\' 9"  (1.753 m)   Wt 159 lb (72.1 kg)   SpO2 98%   BMI 23.48 kg/m  VS noted, non toxic., fatigued but able to get up on exam table with mild assist only, with walker help; orthostatics not repeated today Constitutional: Pt appears in NAD HENT: Head: NCAT.  Right Ear: External ear normal.  Left Ear: External ear normal.  Eyes: . Pupils are equal, round, and reactive to light. Conjunctivae and EOM are normal Nose: without d/c or deformity Neck: Neck supple. Gross normal ROM Cardiovascular:  Normal rate and regular rhythm.   Pulmonary/Chest: Effort normal and breath sounds without rales or wheezing.  Abd:  Soft, NT, ND, + BS, no organomegaly Neurological: Pt is alert. At baseline orientation, motor grossly intact Skin: Skin is warm. No rashes, other new lesions, no LE edema Psychiatric: Pt behavior is normal without agitation No overly nervous or depressed affect No other exam findings   Lab Results  Component Value Date   WBC 7.6 09/21/2016   HGB 12.9 (L) 09/21/2016   HCT 37.9 (L) 09/21/2016   PLT 148 (L) 09/21/2016   GLUCOSE 170 (H) 09/21/2016   CHOL 110 01/24/2016   TRIG 133.0 01/24/2016   HDL 49.70 01/24/2016   LDLCALC 34 01/24/2016   ALT 23 08/01/2016   AST 18 08/01/2016   NA 141 09/21/2016   K 3.8 09/21/2016   CL 110 09/21/2016   CREATININE 0.77 09/21/2016   BUN 11 09/21/2016   CO2 26 09/21/2016   TSH 3.15 01/24/2016   PSA 0.48 12/02/2012   INR 1.2 09/04/2008   HGBA1C 5.7 09/05/2016       Assessment & Plan:

## 2016-09-25 NOTE — Assessment & Plan Note (Signed)
Appears unresponsive to volume replacement or med related; ok to increase the losartan to 50 as per Dr Caryl Comes, and restart flomax/proscar, to cont compression stockings, abd binder and walker use with PT to continue as able; will cont midodrine at 5 mg daily for 1 wk, but if BP at home without significant change in degree of orthostasis over the next wk, would consider increase the midodrine to 10 mg

## 2016-09-25 NOTE — Assessment & Plan Note (Signed)
Pt today denies any hx of this, but is willing to cont the zoloft as wife states has helped.

## 2016-09-25 NOTE — Assessment & Plan Note (Signed)
By itself overall stable overall by history and exam, recent data reviewed with pt, and pt to increase the losartan to 50,  to f/u any worsening symptoms or concerns BP Readings from Last 3 Encounters:  09/24/16 (!) 144/88  09/22/16 (!) 145/82  09/17/16 (!) 158/94

## 2016-09-25 NOTE — Assessment & Plan Note (Signed)
For flomax/proscar restart,  to f/u any worsening symptoms or concerns

## 2016-09-25 NOTE — Assessment & Plan Note (Signed)
stable overall by history and exam, recent data reviewed with pt, and pt to continue medical treatment as before,  to f/u any worsening symptoms or concerns Lab Results  Component Value Date   HGBA1C 5.7 09/05/2016

## 2016-09-30 ENCOUNTER — Telehealth: Payer: Self-pay | Admitting: Internal Medicine

## 2016-09-30 NOTE — Telephone Encounter (Signed)
Requesting verbals for OT to continue care after hospital discharge. For two times a week for 4 weeks.

## 2016-10-01 NOTE — Telephone Encounter (Signed)
Ok for verbals 

## 2016-10-01 NOTE — Telephone Encounter (Signed)
Called Brittany no answer LMOM w/MD response../lmb 

## 2016-10-03 NOTE — Telephone Encounter (Signed)
Old note closing out

## 2016-10-03 NOTE — Telephone Encounter (Signed)
[place] from [place] [facility] [employer] called and the Pt is still having trouble with postural hypertension. Sitting 159/89 and standing BP is 100/4m when he stands he gets flushed and jittery.   [place] [phone]

## 2016-10-03 NOTE — Telephone Encounter (Signed)
This is a known problem, ok to follow for now

## 2016-10-03 NOTE — Telephone Encounter (Signed)
Spoke with Troy Combs and informed her that [name] is aware and the he suggested to keep an eye on it for now.

## 2016-10-04 ENCOUNTER — Telehealth: Payer: Self-pay | Admitting: Cardiology

## 2016-10-04 MED ORDER — MIDODRINE HCL 10 MG PO TABS: ORAL_TABLET | ORAL | 3 refills | Status: DC

## 2016-10-04 NOTE — Telephone Encounter (Signed)
Spoke with [place], PT and she said pt's BP sitting was 90/50, standing was 60/40.  Pt has dizziness and lightheadedness with position changes.  Never feels like he is going to pass out.  Takes Midodrine 5mg  q 4 hrs.  Every time pt stands up it feels like "the blood is draining from his face".  Starting last week pt has begun to feel palpitations at rest.  This episodes last 1-5 minutes.  Spoke with [name] and he said to have pt d/c Carvedilol, increase Midodrine to 10mg  q 4hrs while awake and try d/c'ing either Proscar or Flomax and see if able to still urinate.  Spoke with [place] and went over recommendations per [name].  Urban Gibson states pt is already off of Carvedilol.  Pt attempted to stop BOTH Proscar and Flomax and was told to restart them.  Advised to try eliminating just one instead of both.  Will route to [name] and his nurse to make them aware that pt was already off of Carvedilol.  Advised to call back if increased Midodrine does not help.  Caitlin verbalized understanding and was in agreement with this plan.

## 2016-10-04 NOTE — Telephone Encounter (Signed)
Pt c/o BP issue: STAT if pt c/o blurred vision, one-sided weakness or slurred speech  1. What are your last 5 BP readings? 90/50 sitting & standing 60/40  2. Are you having any other symptoms (ex. Dizziness, headache, blurred vision, passed out)? dizzy  3. What is your BP issue? low

## 2016-10-09 NOTE — Telephone Encounter (Signed)
MD aware that patient was off coreg. Will continue with other recommendations.

## 2016-10-14 ENCOUNTER — Telehealth: Payer: Self-pay | Admitting: Cardiology

## 2016-10-14 NOTE — Telephone Encounter (Signed)
New message     Is there anything else they can do for her husband, his bp skyrockets up to 150's then drops to 40's when he stands up.   Pt c/o BP issue: STAT if pt c/o blurred vision, one-sided weakness or slurred speech  1. What are your last 5 BP readings? 304p sitting 148/*83 standing 113/69 standing , 1246p 118/70 sitting 80/60 standing, 10a 150/96 sitting  73/58 standing  2. Are you having any other symptoms (ex. Dizziness, headache, blurred vision, passed out)? Gets lightheaded when he stands   3. What is your BP issue? Should they find another doctor that can get this under control , this has been going on since April .

## 2016-10-14 NOTE — Telephone Encounter (Signed)
Spoke with wife, DPR on file.  She states that pt's BP has still been all over the place.  Still having severe orthostatic hypotension.  Vitals this AM were: Sitting- 150/96 Standing- 73/58 States pt becomes very lightheaded with position change.  Denies syncope or presyncope.  Wife really wanted pt seen sooner than appt on 6/29 with Chanetta Marshall, NP.  Scheduled pt to see Bonney Leitz, PA-C on 10/16/16 as they were unable to come tomorrow.  Wife states pt gets plenty of fluids in each day.  Mentioned Gatorade and wife states they will try that as well.  Advised I would send message to Dr. [name] to see if she had further recommendations.  Wife appreciative for call.

## 2016-10-14 NOTE — Telephone Encounter (Signed)
I would hold tamsulosin and apply for [facility], this might need to be performed by our pharmacists, I will cc them on this message. He is using abdominal binder already. Please also advise him to sleep in 30 degrees angle, to do that he has to place a brick/book under one side of the bed.

## 2016-10-15 DIAGNOSIS — Z79891 Long term (current) use of opiate analgesic: Secondary | ICD-10-CM

## 2016-10-15 DIAGNOSIS — F341 Dysthymic disorder: Secondary | ICD-10-CM

## 2016-10-15 DIAGNOSIS — I2510 Atherosclerotic heart disease of native coronary artery without angina pectoris: Secondary | ICD-10-CM

## 2016-10-15 DIAGNOSIS — Z951 Presence of aortocoronary bypass graft: Secondary | ICD-10-CM

## 2016-10-15 DIAGNOSIS — Z4789 Encounter for other orthopedic aftercare: Secondary | ICD-10-CM | POA: Diagnosis not present

## 2016-10-15 DIAGNOSIS — I951 Orthostatic hypotension: Secondary | ICD-10-CM

## 2016-10-15 DIAGNOSIS — Z981 Arthrodesis status: Secondary | ICD-10-CM

## 2016-10-15 DIAGNOSIS — Z9842 Cataract extraction status, left eye: Secondary | ICD-10-CM

## 2016-10-15 DIAGNOSIS — Z7951 Long term (current) use of inhaled steroids: Secondary | ICD-10-CM

## 2016-10-15 DIAGNOSIS — Z9841 Cataract extraction status, right eye: Secondary | ICD-10-CM

## 2016-10-15 DIAGNOSIS — Z87891 Personal history of nicotine dependence: Secondary | ICD-10-CM

## 2016-10-15 DIAGNOSIS — I10 Essential (primary) hypertension: Secondary | ICD-10-CM | POA: Diagnosis not present

## 2016-10-15 DIAGNOSIS — Z7982 Long term (current) use of aspirin: Secondary | ICD-10-CM

## 2016-10-15 NOTE — Telephone Encounter (Signed)
Pt has [employer] Part D plan. I pulled up his formulary details and [name] is a tier 5 specialty medication that requires prior authorization. Once approved, his copay will be 25% of the drug cost. Unfortunately, Northera's baseline price is ~$2,300 per 3 month supply. With his insurance after PA would be approved, his monthly copay would be over $200. This will also send pt into the donut hole quickly and he will then have to pay ~$1800 out of pocket on his medications until catastrophic coverage kicks in. Would check with patient to see if this is something he would like to pursue still given high baseline cost. Unfortunately, there are no patient assistance programs available for Medicare patients on Northera.  I see that pt's Flomax and Proscar were previously d/ced but pt needed to resume therapy. Incidence of dizziness/orthostasis is up to 20% with combination of these medications, although pt was taking both meds in January prior to symptom worsening.  Looks like he took fludrocortisone for less than a month in April-May and therapy was stopped when he went to the hospital due to hypertension. Not sure if it might be worth resuming again given severity of pt symptoms.

## 2016-10-15 NOTE — Progress Notes (Signed)
Cardiology Office Note    Date:  10/16/2016   ID:  Troy Combs, DOB [birth year], MRN [id number]  PCP:  Biagio Borg, MD  Cardiologist:  Dr. [name] [name]  CC: orthostatic hypotension  History of Present Illness:  Troy Combs is a 83 y.o. male with a history of CAD s/p CABG x4V (2002), HTN, atrial flutter, known orthostatic hypotension who presents to clinic for evaluation of orthostatic hypotension.   The last cath in 5/10 with patent L-LAD, S-RCA, S-OM1/OM2, EF 60%.   In the past his Toprol XL was increased to 100 mg PO daily for symptomatic PVCs, but later decreased to 50 mg po daily for fatigue and dizziness. He was later switched to Coreg with improvement in symptoms (fatigue).  He has been known to have orthostatic hypotension but doesn't comply with salt and fluid liberalization. He doesn't like water and drinks a lot of coffee. He has complained of exertional dizziness in the past.   He was sen by Dr. [name] in 2016. He complained of worsening DOE. Exercise Myoview stress test was low risk but did show chronotropic incompetence and it was switched to lexiscan. 2D ECHO at that time showed vigorous LV function (EF 65-70%), G1DD and mild bileaflet MVP.  48 hour holter monitor in 04/2015 showed very infrequent PVCS and PACs.   Seen back by Dr. [name] in 04/2016 and having worsening DOE. Another stress test was ordered which was low risk.  He was admitted 4/6-4/16/18 for redo disectomy at L4/5 on the right side. He had a durotomy with CSF leak while retracting the thecal sac. He was patched and kept on bedrest. He was discharged to [facility] for [facility]hab.  He was added onto my schedule on 08/26/16 for evaluation of worsening orthostatic hypotension, especially during his PT sessions. I discontinued his coreg 3.125mg  BID and started Florinef. I also advised that he wear his compression stockings and liberalize salt and fluids and send him to [name].   He saw  [name] on 09/17/16 who felt orthostatic hypotension was likely related to longstanding systolic HTN. Given his supine HTN he was started on losartan 50mg  at night and stop Florinef. He also recommended an abdominal binder and stopping his alpha blocker (BPH).   He was then admitted 5/25-5/27/18 for orthostatic hypotension. His flomax and proscar were discontinued and Losartan decreased from 50mg  daily to 25mg  daily. Later he had to resume flomax and proscar due to unacceptable symptoms.   He has been placed on midodrine since that time which has been increased to 10mg  q 4 hours. Dr. [name] recommended [name] but it looks like this may be cost prohibitive. She also recommended that he elevate his bed 10-20 degrees.   Today he presents to clinic for evaluation. He has been wearing compression stockings, elevating his bed, wearing an abdominal binder, taking midodrine and discontinued Finasteride. His symptoms has continued to worsen. He can't think of anything that we have tried that has helped. He cannot complete daily activities with any positional change due to extreme dizziness. He has not had any syncope. Dizziness lasts for minutes. He denies CP or SOB. No LE edema or orthopnea.    Past Medical History:  Diagnosis Date  . Allergic rhinitis 09/20/2014  . Anemia, unspecified   . Arthritis   . Bladder neck obstruction   . Cataracts, bilateral   . Coronary atherosclerosis of unspecified type of vessel, native or graft   . Diminished hearing, bilateral   .  Dysthymic disorder   . GERD (gastroesophageal reflux disease)   . Heart murmur    as a child  . Impaired glucose tolerance 12/02/2012  . Impotence of organic origin   . Lumbar radiculopathy   . Malignant melanoma of skin of upper limb, including shoulder ([place])   . Old myocardial infarction   . Personal history of malignant melanoma of skin   . Personal history of other diseases of digestive system   . Thoracic or lumbosacral neuritis  or radiculitis, unspecified   . Unspecified essential hypertension     Past Surgical History:  Procedure Laterality Date  . ARTHRODESIS  07/06/2002   of left long finger distal interphalangeal joint with Kirschner wire fixation X 3  . ATRIAL ABLATION SURGERY  05/04/2001   Dr. [name]  . CARDIAC CATHETERIZATION  09/05/2008   Revealing 4 of 4 patent grafts with native multivessel coronary artery disease, EF  of 60% without regional wall motion abnormalities.  Marland Kitchen CATARACT EXTRACTION, BILATERAL    . CHOLECYSTECTOMY    . COLONOSCOPY    . CORONARY ANGIOPLASTY  1993  . CORONARY ARTERY BYPASS GRAFT  10/17/2000   [name]. [name], [place]     AFTER LIVER TRAUMA AND HAND SURGERY  . EYE SURGERY    . HERNIA REPAIR     BILATERAL  . LUMBAR LAMINECTOMY/DECOMPRESSION MICRODISCECTOMY N/A 02/02/2016   Procedure: MICRODISCECTOMY LUMBAR FOUR- LUMBAR FIVE;  Surgeon: Ashok Pall, MD;  Location: [facility];  Service: Neurosurgery;  Laterality: N/A;  MICRODISCECTOMY L4-L5  . LUMBAR LAMINECTOMY/DECOMPRESSION MICRODISCECTOMY Right 08/02/2016   Procedure: MICRODISCECTOMY LUMBAR FOUR- LUMBAR FIVE RIGHT;  Surgeon: Ashok Pall, MD;  Location: [facility];  Service: Neurosurgery;  Laterality: Right;  . punctured eardrum      to relieve blood build up   for ear infection  . TONSILLECTOMY      Current Medications: Outpatient Medications Prior to Visit  Medication Sig Dispense Refill  . albuterol (PROVENTIL HFA;VENTOLIN HFA) 108 (90 BASE) MCG/ACT inhaler Inhale 2 puffs into the lungs every 6 (six) hours as needed for wheezing or shortness of breath. 1 Inhaler 11  . aspirin EC 81 MG tablet Take 81 mg by mouth at bedtime.     Marland Kitchen losartan (COZAAR) 50 MG tablet Take 1 tablet (50 mg total) by mouth daily. 90 tablet 3  . nitroGLYCERIN (NITROSTAT) 0.4 MG SL tablet Place 1 tablet (0.4 mg total) under the tongue every 5 (five) minutes as needed for chest pain (x 3 doses). Reported on 06/15/2015 25 tablet 6    . nortriptyline (PAMELOR) 25 MG capsule Take 1 capsule (25 mg total) by mouth at bedtime. 90 capsule 2  . omeprazole (PRILOSEC) 20 MG capsule Take 20 mg by mouth daily as needed (acid reflux).     . rosuvastatin (CRESTOR) 10 MG tablet Take 10 mg by mouth at bedtime.     . tamsulosin (FLOMAX) 0.4 MG CAPS capsule Take 1 capsule (0.4 mg total) by mouth daily after supper. 90 capsule 3  . finasteride (PROSCAR) 5 MG tablet Take 1 tablet (5 mg total) by mouth daily. 90 tablet 3  . midodrine (PROAMATINE) 10 MG tablet Take 1 tablet (10 mg total) by mouth every 4 (four) hours while awake., (Patient taking differently: Take 2 tablet (10 mg total) by mouth every 4 (four) hours while awake.,) 150 tablet 3  . sertraline (ZOLOFT) 50 MG tablet TAKE 1 TABLET DAILY (Patient taking differently: TAKE 1 TABLET DAILY IN THE EVENING)  90 tablet 3   No facility-administered medications prior to visit.      Allergies:   Iohexol; Ciprofloxacin; and Other   Social History   Social History  . Marital status: Married    Spouse name: N/A  . Number of children: 5  . Years of education: N/A   Social History Main Topics  . Smoking status: Former Smoker    Quit date: 09/21/1955  . Smokeless tobacco: Never Used  . Alcohol use 0.0 oz/week     Comment: one or two ounces a week  . Drug use: No  . Sexual activity: Not Asked   Other Topics Concern  . None   Social History Narrative   Lives with wife in a one story home.  Has 5 children.  Retired Freight forwarder for [employer].  Education: 2 years of college.      Family History:  The patient's family history includes Heart disease in his father.      ROS:   Please see the history of present illness.    ROS All other systems reviewed and are negative.   PHYSICAL EXAM:   VS:  BP 110/78   Pulse 100   Ht 5\' 9"  (1.753 m)   Wt 172 lb (78 kg)   BMI 25.40 kg/m    GEN: Well nourished, well developed, in no acute distress  HEENT: normal  Neck: no JVD, carotid bruits, or  masses Cardiac: RRR; no murmurs, rubs, or gallops,no edema  Respiratory:  clear to auscultation bilaterally, normal work of breathing GI: soft, nontender, nondistended, + BS MS: no deformity or atrophy  Skin: warm and dry, no rash Neuro:  Alert and Oriented x 3, Strength and sensation are intact Psych: euthymic mood, full affect   Wt Readings from Last 3 Encounters:  10/16/16 172 lb (78 kg)  09/24/16 159 lb (72.1 kg)  09/22/16 165 lb 4.8 oz (75 kg)      Studies/Labs Reviewed:   EKG:  EKG is NOT ordered today.    Recent Labs: 01/24/2016: TSH 3.15 08/01/2016: ALT 23 09/21/2016: BUN 11; Creatinine, Ser 0.77; Hemoglobin 12.9; Platelets 148; Potassium 3.8; Sodium 141   Lipid Panel    Component Value Date/Time   CHOL 110 01/24/2016 0955   TRIG 133.0 01/24/2016 0955   TRIG 83 03/18/2006 0839   HDL 49.70 01/24/2016 0955   CHOLHDL 2 01/24/2016 0955   VLDL 26.6 01/24/2016 0955   LDLCALC 34 01/24/2016 0955    Additional studies/ records that were reviewed today include:  Echo: 08/16/2014 Study Conclusions - Left ventricle: The cavity size was normal. Wall thickness was increased in a pattern of mild LVH. There was mild focal basal hypertrophy of the septum. Systolic function was vigorous. The estimated ejection fraction was in the range of 65% to 70%. Wall motion was normal; there were no regional wall motion abnormalities. Doppler parameters are consistent with abnormal left ventricular relaxation (grade 1 diastolic dysfunction). - Mitral valve: Calcified annulus. Mild prolapse, involving the anterior leaflet and the posterior leaflet. Impressions: - Vigorous LV function; grade 1 diastolic dysfunction; mild bileaflet MVP; trace MR and TR.  Lexiscan stress test: 06/2014 Impression Exercise Capacity: Lexiscan with low level exercise. BP Response: Normal blood pressure response. Clinical Symptoms: No chest pain. ECG Impression: No significant ST segment  change suggestive of ischemia. Comparison with Prior Nuclear Study:Previous report was low risk scan  Overall Impression: Probable normal perfusion and minimal apical thnning. No evidence for significant ischemia or scar. Overall low risk scan  LV Ejection Fraction: 66%. LV Wall Motion: NL LV Function; NL Wall Motion   Lesxiscan Myoview 05/07/16 Study Highlights   Nuclear stress EF: 56%.  There was no ST segment deviation noted during stress.  The study is normal.  This is a low risk study.  The left ventricular ejection fraction is normal (55-65%).     48 hour holter monitor 04/2015 Study Highlights  Very frequent monomorphic PVCs - 16.700 in 48 hours. Some couplets.  Infrequent PACs.  Very frequent monomorphic PVCs - 16.700 in 48 hours.     ASSESSMENT & PLAN:   Symptomatic orthostatic hypotension:  he has been wearing compression stockings, elevating his bed, wearing an abdominal binder, taking midodrine q 4 hours and discontinued Finasteride. His symptoms have continued to worsen. He can't think of anything that we have tried that has helped. He cannot complete daily activities with any positional change due to extreme dizziness. He has not had any syncope. I discussed case with Dr. Radford Pax who thinks we should refer him to neurology as sx worsened after neurosurgery with CSF leak. Will also try to refer him to [facility]   Systemic hypertension: BP well controlled today. Continue Losartan 50mg  daily.   CAD s/p CABG: stable. Recently nuclear stress test in 04/2016 was low risk for ischemia. No chest pain. Continue ASA, statin. No BB given orthostasis.   PVCs: stable  HLD: continue statin   Total time spent with patient was over 40 minutes which included evaluating patient, reviewing record and coordinating care. Face to face time >50%. I discussed case with Dr. Radford Pax and we formulated a plan    Medication Adjustments/Labs and Tests Ordered: Current  medicines are reviewed at length with the patient today.  Concerns regarding medicines are outlined above.  Medication changes, Labs and Tests ordered today are listed in the Patient Instructions below. Patient Instructions  Medication Instructions:  Your physician recommends that you continue on your current medications as directed. Please refer to the Current Medication list given to you today.   Labwork: None ordered  Testing/Procedures: None ordered  Follow-Up: Your physician recommends that you schedule a follow-up appointment in: [place]   Any Other Special Instructions Will Be Listed Below (If Applicable). You have been referred to [facility].  THEY WILL CONTACT YOU WITH AN APPOINTMENT.  You have been referred to DR. Arlington @ [place], [place] [facility].  THEY WILL CONTACT YOU WITH AN APPOINTMENT.  IF YOU HAVE NOT HEARD FROM ANY OF THE ABOVE OFFICES IN 7-10 BUSINESS DAYS, REGARDING AN APPT, PLEASE CALL AND LET us KNOW.        If you need a refill on your cardiac medications before your next appointment, please call your pharmacy.      Signed, Angelena Form, PA-C  10/16/2016 4:26 PM    [facility] [address], [place]  [address] Phone: [phone]; Fax: [phone]

## 2016-10-15 NOTE — Telephone Encounter (Signed)
Thank you Megan.

## 2016-10-16 ENCOUNTER — Ambulatory Visit (INDEPENDENT_AMBULATORY_CARE_PROVIDER_SITE_OTHER): Payer: Medicare Other | Admitting: Physician Assistant

## 2016-10-16 ENCOUNTER — Encounter: Payer: Self-pay | Admitting: Physician Assistant

## 2016-10-16 VITALS — BP 110/78 | HR 100 | Ht 69.0 in | Wt 172.0 lb

## 2016-10-16 DIAGNOSIS — I10 Essential (primary) hypertension: Secondary | ICD-10-CM

## 2016-10-16 DIAGNOSIS — I951 Orthostatic hypotension: Secondary | ICD-10-CM | POA: Diagnosis not present

## 2016-10-16 DIAGNOSIS — R000 Tachycardia, unspecified: Secondary | ICD-10-CM

## 2016-10-16 DIAGNOSIS — G90A Postural orthostatic tachycardia syndrome (POTS): Secondary | ICD-10-CM

## 2016-10-16 DIAGNOSIS — E782 Mixed hyperlipidemia: Secondary | ICD-10-CM

## 2016-10-16 DIAGNOSIS — Z951 Presence of aortocoronary bypass graft: Secondary | ICD-10-CM | POA: Diagnosis not present

## 2016-10-16 DIAGNOSIS — I498 Other specified cardiac arrhythmias: Secondary | ICD-10-CM

## 2016-10-16 DIAGNOSIS — I493 Ventricular premature depolarization: Secondary | ICD-10-CM

## 2016-10-16 NOTE — Telephone Encounter (Signed)
Disregard Dr. [name].  This pt has an appt here at our office today, for complaints mentioned.  He will see Nell Range PA-C at 2 pm.

## 2016-10-16 NOTE — Telephone Encounter (Signed)
Dr. [name], is there anything you would like for me to order on this pt?  Any further recommendations for this pt, given problematic coverage for Northera?

## 2016-10-16 NOTE — Patient Instructions (Addendum)
Medication Instructions:  Your physician recommends that you continue on your current medications as directed. Please refer to the Current Medication list given to you today.   Labwork: None ordered  Testing/Procedures: None ordered  Follow-Up: Your physician recommends that you schedule a follow-up appointment in: [place]   Any Other Special Instructions Will Be Listed Below (If Applicable). You have been referred to [facility].  THEY WILL CONTACT YOU WITH AN APPOINTMENT.  You have been referred to DR. Dresser @ [place], [place] [facility].  THEY WILL CONTACT YOU WITH AN APPOINTMENT.  IF YOU HAVE NOT HEARD FROM ANY OF THE ABOVE OFFICES IN 7-10 BUSINESS DAYS, REGARDING AN APPT, PLEASE CALL AND LET us KNOW.        If you need a refill on your cardiac medications before your next appointment, please call your pharmacy.

## 2016-10-20 NOTE — Progress Notes (Deleted)
Electrophysiology Office Note Date: 10/20/2016  ID:  Troy Combs, DOB 08-Dec-1931, MRN [id number]  PCP: Biagio Borg, MD Primary Cardiologist: *** Electrophysiologist: ***  CC: ***  Troy Combs is a 83 y.o. male seen today for ***.  {He/she (caps):30048} presents today for routine electrophysiology followup.  Since last being seen in our clinic, the patient reports doing very well.  {He/she (caps):30048} denies chest pain, palpitations, dyspnea, PND, orthopnea, nausea, vomiting, dizziness, syncope, edema, weight gain, or early satiety.  Past Medical History:  Diagnosis Date  . Allergic rhinitis 09/20/2014  . Anemia, unspecified   . Arthritis   . Bladder neck obstruction   . Cataracts, bilateral   . Coronary atherosclerosis of unspecified type of vessel, native or graft   . Diminished hearing, bilateral   . Dysthymic disorder   . GERD (gastroesophageal reflux disease)   . Heart murmur    as a child  . Impaired glucose tolerance 12/02/2012  . Impotence of organic origin   . Lumbar radiculopathy   . Malignant melanoma of skin of upper limb, including shoulder ([place])   . Old myocardial infarction   . Personal history of malignant melanoma of skin   . Personal history of other diseases of digestive system   . Thoracic or lumbosacral neuritis or radiculitis, unspecified   . Unspecified essential hypertension    Past Surgical History:  Procedure Laterality Date  . ARTHRODESIS  07/06/2002   of left long finger distal interphalangeal joint with Kirschner wire fixation X 3  . ATRIAL ABLATION SURGERY  05/04/2001   Dr. [name]  . CARDIAC CATHETERIZATION  09/05/2008   Revealing 4 of 4 patent grafts with native multivessel coronary artery disease, EF  of 60% without regional wall motion abnormalities.  Marland Kitchen CATARACT EXTRACTION, BILATERAL    . CHOLECYSTECTOMY    . COLONOSCOPY    . CORONARY ANGIOPLASTY  1993  . CORONARY ARTERY BYPASS GRAFT  10/17/2000   [name].  [name], [place]     AFTER LIVER TRAUMA AND HAND SURGERY  . EYE SURGERY    . HERNIA REPAIR     BILATERAL  . LUMBAR LAMINECTOMY/DECOMPRESSION MICRODISCECTOMY N/A 02/02/2016   Procedure: MICRODISCECTOMY LUMBAR FOUR- LUMBAR FIVE;  Surgeon: Ashok Pall, MD;  Location: [facility];  Service: Neurosurgery;  Laterality: N/A;  MICRODISCECTOMY L4-L5  . LUMBAR LAMINECTOMY/DECOMPRESSION MICRODISCECTOMY Right 08/02/2016   Procedure: MICRODISCECTOMY LUMBAR FOUR- LUMBAR FIVE RIGHT;  Surgeon: Ashok Pall, MD;  Location: [facility];  Service: Neurosurgery;  Laterality: Right;  . punctured eardrum      to relieve blood build up   for ear infection  . TONSILLECTOMY      Current Outpatient Prescriptions  Medication Sig Dispense Refill  . albuterol (PROVENTIL HFA;VENTOLIN HFA) 108 (90 BASE) MCG/ACT inhaler Inhale 2 puffs into the lungs every 6 (six) hours as needed for wheezing or shortness of breath. 1 Inhaler 11  . aspirin EC 81 MG tablet Take 81 mg by mouth at bedtime.     Marland Kitchen losartan (COZAAR) 50 MG tablet Take 1 tablet (50 mg total) by mouth daily. 90 tablet 3  . midodrine (PROAMATINE) 5 MG tablet     . midodrine (PROAMATINE) 5 MG tablet Take 2 tablets 10 mg by mouth every 4 hours while awake    . nitroGLYCERIN (NITROSTAT) 0.4 MG SL tablet Place 1 tablet (0.4 mg total) under the tongue every 5 (five) minutes as needed for chest pain (x 3 doses). Reported  on 06/15/2015 25 tablet 6  . nortriptyline (PAMELOR) 25 MG capsule Take 1 capsule (25 mg total) by mouth at bedtime. 90 capsule 2  . omeprazole (PRILOSEC) 20 MG capsule Take 20 mg by mouth daily as needed (acid reflux).     . rosuvastatin (CRESTOR) 10 MG tablet Take 10 mg by mouth at bedtime.     . sertraline (ZOLOFT) 50 MG tablet Take 50 mg by mouth at bedtime.    . tamsulosin (FLOMAX) 0.4 MG CAPS capsule Take 1 capsule (0.4 mg total) by mouth daily after supper. 90 capsule 3   No current facility-administered medications for this visit.      Allergies:   Iohexol; Ciprofloxacin; and Other   Social History: Social History   Social History  . Marital status: Married    Spouse name: N/A  . Number of children: 5  . Years of education: N/A   Occupational History  . Not on file.   Social History Main Topics  . Smoking status: Former Smoker    Quit date: 09/21/1955  . Smokeless tobacco: Never Used  . Alcohol use 0.0 oz/week     Comment: one or two ounces a week  . Drug use: No  . Sexual activity: Not on file   Other Topics Concern  . Not on file   Social History Narrative   Lives with wife in a one story home.  Has 5 children.  Retired Freight forwarder for [employer].  Education: 2 years of college.     Family History: Family History  Problem Relation Age of Onset  . Heart disease Father   . Ataxia Neg Hx   . Chorea Neg Hx   . Dementia Neg Hx   . Mental retardation Neg Hx   . Migraines Neg Hx   . Multiple sclerosis Neg Hx   . Neurofibromatosis Neg Hx   . Neuropathy Neg Hx   . Parkinsonism Neg Hx   . Seizures Neg Hx   . Stroke Neg Hx     Review of Systems: General: No chills, fever, night sweats or weight changes  Cardiovascular:  No chest pain, dyspnea on exertion, edema, orthopnea, palpitations, paroxysmal nocturnal dyspnea  Dermatological: No rash, lesions or masses Respiratory: No cough, dyspnea Urologic: No hematuria, dysuria Abdominal: No nausea, vomiting, diarrhea, bright red blood per rectum, melena, or hematemesis Neurologic: No visual changes, weakness, changes in mental status All other systems reviewed and are otherwise negative except as noted above.   Physical Exam: VS:  There were no vitals taken for this visit. , BMI There is no height or weight on file to calculate BMI. Wt Readings from Last 3 Encounters:  10/16/16 172 lb (78 kg)  09/24/16 159 lb (72.1 kg)  09/22/16 165 lb 4.8 oz (75 kg)    GEN- The patient is well appearing, alert and oriented x 3 today.   HEENT: normocephalic,  atraumatic; sclera clear, conjunctiva pink; hearing intact; oropharynx clear; neck supple, no JVP Lymph- no cervical lymphadenopathy Lungs- Clear to ausculation bilaterally, normal work of breathing.  No wheezes, rales, rhonchi Heart- Regular rate and rhythm, no murmurs, rubs or gallops, PMI not laterally displaced GI- soft, non-tender, non-distended, bowel sounds present, no hepatosplenomegaly Extremities- no clubbing, cyanosis, or edema; DP/PT/radial pulses 2+ bilaterally MS- no significant deformity or atrophy Skin- warm and dry, no rash or lesion  Psych- euthymic mood, full affect Neuro- strength and sensation are intact   EKG:  EKG {ACTION; IS/IS YQM:[id number]} ordered today. The ekg ordered today  shows ***  Recent Labs: 01/24/2016: TSH 3.15 08/01/2016: ALT 23 09/21/2016: BUN 11; Creatinine, Ser 0.77; Hemoglobin 12.9; Platelets 148; Potassium 3.8; Sodium 141    Other studies Reviewed: Additional studies/ records that were reviewed today include: ***  Review of the above records today demonstrates: ***  Assessment and Plan:  ***   Current medicines are reviewed at length with the patient today.   The patient {ACTIONS; HAS/DOES NOT HAVE:19233} concerns regarding his medicines.  The following changes were made today:  {NONE DEFAULTED:18576::"none"}  Labs/ tests ordered today include: *** No orders of the defined types were placed in this encounter.    Disposition:   Follow up with *** {gen number 2-24:825003} {TIME; UNITS DAY/WEEK/MONTH:19136}   Signed, Chanetta Marshall, NP 10/20/2016 7:45 PM   [address] [place] [address] [phone] (office) [phone] (fax

## 2016-10-22 ENCOUNTER — Ambulatory Visit: Payer: Medicare Other | Admitting: Internal Medicine

## 2016-10-23 ENCOUNTER — Other Ambulatory Visit: Payer: Medicare Other

## 2016-10-23 ENCOUNTER — Encounter: Payer: Self-pay | Admitting: Neurology

## 2016-10-23 ENCOUNTER — Telehealth: Payer: Self-pay

## 2016-10-23 ENCOUNTER — Ambulatory Visit (INDEPENDENT_AMBULATORY_CARE_PROVIDER_SITE_OTHER): Payer: Medicare Other | Admitting: Neurology

## 2016-10-23 VITALS — BP 102/58 | HR 94 | Ht 69.0 in | Wt 173.0 lb

## 2016-10-23 DIAGNOSIS — I951 Orthostatic hypotension: Secondary | ICD-10-CM

## 2016-10-23 DIAGNOSIS — R519 Headache, unspecified: Secondary | ICD-10-CM

## 2016-10-23 DIAGNOSIS — G9782 Other postprocedural complications and disorders of nervous system: Secondary | ICD-10-CM

## 2016-10-23 DIAGNOSIS — R51 Headache: Secondary | ICD-10-CM

## 2016-10-23 DIAGNOSIS — G9600 Cerebrospinal fluid leak, unspecified: Secondary | ICD-10-CM

## 2016-10-23 NOTE — Patient Instructions (Signed)
1. Bloodwork for ESR, BUN, creatinine 2. Schedule MRI brain with and without contrast 3. Schedule MRI lumbar spine with and without contrast 4. Continue all your medications, abdominal binder, compression stockings, increased fluid intake 5. Follow-up after tests

## 2016-10-23 NOTE — Progress Notes (Signed)
 NEUROLOGY FOLLOW UP OFFICE NOTE  Troy Combs [id number]  HISTORY OF PRESENT ILLNESS: I had the pleasure of seeing Troy Combs in follow-up in the neurology clinic on 10/23/2016. He is again accompanied by his wife who helps supplement the history today. The patient was last seen 9 months ago for vertigo and headaches. He presents today for evaluation of different symptoms that he and his wife report started after back surgery in April 2018. On review of notes, he had a durotomy while retracting the thecal sac. He was patched and kept on bedrest, and there was no evidence of leak through the skin. He was discharged to rehab, and while in rehab he was noted to have orthostatic hypotension, with significant drop with exertion and associated dizziness. He was seen a few days later by Cardiology, and there is note that he has been known to have orthostatic hypotension and complained of exertional dizziness in the past. He agreed this was the case, but since surgery, symptoms have been much worse. He could not get to the door without feeling like he would pass out. He was noted to have a drop in SBP from 143 to 90 in the office. Coreg was discontinued. He was started on Florinef 0.1mg daily. He saw cardiologist Dr. Klein last month, with note of orthostatic hypotension antedating back surgery, mechanism unclear but likely related to long-standing systolic hypertension. His supine pressures were over 170, and Losartan was started. Abdominal binder was advised. He continued to have worsening of dizziness with any activity, and was admitted to [facility] on 09/20/16, with note of 50-point drop from supine to standing. He became hypertensive in the ER. Florinef and BPH (Finasteride and Flomax) medications were discontinued and Losartan dose reduced, with note of improvement in orthostatic hypotension. Since hospital discharge, he would have very labile BP changes, 161/93 supine, 79/54 standing. They called Dr.  Klein who recommended increasing Losartan back to 50mg, and restarting Flomax. He was using the abdominal binder. He was advised to start ProAmantine 5mg every 4 hours while awake, then increased to 10mg q4hrs. He reported wearing an abdominal binder, using compression stocking, taking midodrone, and stopping finasteride, but sytmpoms continue to worsen. He was referred to Neurology due to symptoms worsening after Neurosurgery with CSF leak.   He presents today reporting that that he does not think he was getting this significant dizziness before the surgery. He described the dizziness as "disorientation more than anything." He has not passed out. He has been having headaches but they are not positional in nature. They were more severe in rehab, still occurring every other day, improving with 2 Equates (aspirin, acetaminophen, caffeine). He feels pressure sometimes in the back of his head or diffusely, no associated nausea/vomiting, photo/phonophobia, it does not seem to improve when supine. He has chronic tinnitus that is unchanged. He has a history of peripheral neuropathy and denies any numbness or tingling in his extremities, but has new symptoms of muscle spasms in his arms, legs and back for the past 2 weeks. He has chronic vertical diplopia that is better with nortriptyline. He denies any dysphagia, dysarthria, bowel/bladder dysfunction, no back pain. They bring a calendar of his BP readings, this morning he went from 138/88 sitting (HR 82) to 80/52 standing (HR 97).   HPI 02/2015: This is a pleasant 84 yo RH man who presented for vertigo and headaches. He had been previously seen by one of my partners, Dr. Jaffe in 2014 for cervicogenic headaches. He had   vertigo that started in April/May 2016. He would have a sensation of tilting when he sat on the edge of the bed, with violent vomiting. He was prescribed meclizine, which would help sometimes. There was concern this was due to his beta-blocker, and after  adjustment, he reported the intense dizziness went away, but he still had dizziness if moving too quickly, turning his head, or standing up. He was in the ER in September 2016 due to significant vertigo with dull headache. He had an MRI brain without contrast which I personally reviewed, no acute changes, there was chronic microvascular disease and generalized atrophy. He saw his PCP and was referred for vestibular rehab. He reports that vestibular rehab worked almost immediately. The dizziness has since resolved. He has a little lightheadedness and feels off balance and unsteady, no falls. He has occasional tingling and numbness in the last 3 digits of both feet, and occasionally pins and needles sensation in the soles of his feet. He has had monocular vertical diplopia for many years, and noticed that this was less when he takes nortriptyline. He had been having occipital headaches, but reported those are pretty much gone, with 1 to 2 over 10 pain, no associated nausea, vomiting, photo/phonophobia. He has some neck crepitus, "like gravel when turning my head," and occasional low back pain. He has some constipation. No family history of similar symptoms.  PAST MEDICAL HISTORY: Past Medical History:  Diagnosis Date  . Allergic rhinitis 09/20/2014  . Anemia, unspecified   . Arthritis   . Bladder neck obstruction   . Cataracts, bilateral   . Coronary atherosclerosis of unspecified type of vessel, native or graft   . Diminished hearing, bilateral   . Dysthymic disorder   . GERD (gastroesophageal reflux disease)   . Heart murmur    as a child  . Impaired glucose tolerance 12/02/2012  . Impotence of organic origin   . Lumbar radiculopathy   . Malignant melanoma of skin of upper limb, including shoulder ([facility])   . Old myocardial infarction   . Personal history of malignant melanoma of skin   . Personal history of other diseases of digestive system   . Thoracic or lumbosacral neuritis or radiculitis,  unspecified   . Unspecified essential hypertension     MEDICATIONS: Current Outpatient Prescriptions on File Prior to Visit  Medication Sig Dispense Refill  . albuterol (PROVENTIL HFA;VENTOLIN HFA) 108 (90 BASE) MCG/ACT inhaler Inhale 2 puffs into the lungs every 6 (six) hours as needed for wheezing or shortness of breath. 1 Inhaler 11  . aspirin EC 81 MG tablet Take 81 mg by mouth at bedtime.     . losartan (COZAAR) 50 MG tablet Take 1 tablet (50 mg total) by mouth daily. 90 tablet 3  . midodrine (PROAMATINE) 5 MG tablet     . midodrine (PROAMATINE) 5 MG tablet Take 2 tablets 10 mg by mouth every 4 hours while awake    . nitroGLYCERIN (NITROSTAT) 0.4 MG SL tablet Place 1 tablet (0.4 mg total) under the tongue every 5 (five) minutes as needed for chest pain (x 3 doses). Reported on 06/15/2015 25 tablet 6  . nortriptyline (PAMELOR) 25 MG capsule Take 1 capsule (25 mg total) by mouth at bedtime. 90 capsule 2  . omeprazole (PRILOSEC) 20 MG capsule Take 20 mg by mouth daily as needed (acid reflux).     . rosuvastatin (CRESTOR) 10 MG tablet Take 10 mg by mouth at bedtime.     . sertraline (ZOLOFT)   50 MG tablet Take 50 mg by mouth at bedtime.    . tamsulosin (FLOMAX) 0.4 MG CAPS capsule Take 1 capsule (0.4 mg total) by mouth daily after supper. 90 capsule 3   No current facility-administered medications on file prior to visit.     ALLERGIES: Allergies  Allergen Reactions  . Iohexol Other (See Comments)     Code: HIVES, Desc: PATENT STATES HE IS ALLERGIC TO IV DYE 09/14/08/RM, Onset Date: 05092010   . Ciprofloxacin Hives and Rash    "Patient unsure" just know he has a reaction to med Medical record indicates allergic reaction  . Other     UNSPECIFIED REACTION  Angioplasty dye pt.>> Pt only recalls Had "very bad reaction"    FAMILY HISTORY: Family History  Problem Relation Age of Onset  . Heart disease Father   . Ataxia Neg Hx   . Chorea Neg Hx   . Dementia Neg Hx   . Mental  retardation Neg Hx   . Migraines Neg Hx   . Multiple sclerosis Neg Hx   . Neurofibromatosis Neg Hx   . Neuropathy Neg Hx   . Parkinsonism Neg Hx   . Seizures Neg Hx   . Stroke Neg Hx     SOCIAL HISTORY: Social History   Social History  . Marital status: Married    Spouse name: N/A  . Number of children: 5  . Years of education: N/A   Occupational History  . Not on file.   Social History Main Topics  . Smoking status: Former Smoker    Quit date: 09/21/1955  . Smokeless tobacco: Never Used  . Alcohol use 0.0 oz/week     Comment: one or two ounces a week  . Drug use: No  . Sexual activity: Not on file   Other Topics Concern  . Not on file   Social History Narrative   Lives with wife in a one story home.  Has 5 children.  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  Education: 2 years of college.     REVIEW OF SYSTEMS: Constitutional: No fevers, chills, or sweats, no generalized fatigue, change in appetite Eyes: No visual changes, double vision, eye pain Ear, nose and throat: No hearing loss, ear pain, nasal congestion, sore throat Cardiovascular: No chest pain, palpitations Respiratory:  No shortness of breath at rest or with exertion, wheezes GastrointestinaI: No nausea, vomiting, diarrhea, abdominal pain, fecal incontinence Genitourinary:  No dysuria, urinary retention or frequency Musculoskeletal:  No neck pain, back pain Integumentary: No rash, pruritus, skin lesions Neurological: as above Psychiatric: No depression, insomnia, anxiety Endocrine: No palpitations, fatigue, diaphoresis, mood swings, change in appetite, change in weight, increased thirst Hematologic/Lymphatic:  No anemia, purpura, petechiae. Allergic/Immunologic: no itchy/runny eyes, nasal congestion, recent allergic reactions, rashes  PHYSICAL EXAM: Vitals:   10/23/16 1112  BP: (!) 102/58  Pulse: 94   General: No acute distress Head:  Normocephalic/atraumatic Neck: supple, no paraspinal tenderness, full range  of motion Heart:  Regular rate and rhythm Lungs:  Clear to auscultation bilaterally Back: No paraspinal tenderness Skin/Extremities: No rash, no edema Neurological Exam: alert and oriented to person, place, and time. No aphasia or dysarthria. Fund of knowledge is appropriate.  Recent and remote memory are intact.  Attention and concentration are normal.    Able to name objects and repeat phrases. Cranial nerves: Pupils equal, round, reactive to light. Extraocular movements intact with no nystagmus. Visual fields full. Facial sensation intact. No facial asymmetry. Tongue, uvula, palate midline.  Motor:   Bulk and tone normal, muscle strength 5/5 throughout with no pronator drift.  Sensation intact to all modalities on both UE, decreased vibration to hip bilaterally, decreased joint position sense to ankles bilaterally, decreased pin on right calf, decreased cold up to both calves. Deep tendon reflexes +1 on both UE and patella, +2 ankles. Upgoing toe on right. Finger to nose testing intact.  Gait slow and cautious, no ataxia, he gets dizzy on standing. Romberg positive sway.  IMPRESSION: This is a pleasant 83 yo RH man with a history of hypertension, CAD s/p MI and CABG, orthostatic hypotension, cervicogenic headaches, presenting with new symptoms of significant worsening of orthostatic hypotension since back surgery last April 2018. There was note of durotomy while retracting the thecal sac, but no evidence of leak in the skin on hospital discharge. Neurologic exam today shows worsening of peripheral neuropathy. This is likely contributing to the dysautonomia, however it is curious how symptoms have significantly worsened since surgery. Patients can have orthostatic headaches with CSF leaks, but not clearly causing significant hypotension. MRI brain with and without contrast and MRI lumbar spine with and without contrast will be ordered to further evaluate his symptoms. Check ESR for change in headaches,  although temporal arteritis would clearly not cause all these symptoms. He has had bloodwork for peripheral neuropathy in the past, which was normal. Continue all medications, abdominal binder, compression stockings, increase hydration, liberalize salt intake. He will follow-up after the tests.   Thank you for allowing me to participate in his care.  Please do not hesitate to call for any questions or concerns.    Ellouise Newer, M.D.   CC: Dr. Jenny Reichmann, Dr. [name], [name], Angelena Form, PA-C

## 2016-10-23 NOTE — Telephone Encounter (Signed)
Spoke with Mardene Celeste at [facility].  She says they are booked out through July 11th or 12th.  Mardene Celeste says she will contact pt to schedule.

## 2016-10-24 ENCOUNTER — Telehealth: Payer: Self-pay | Admitting: Neurology

## 2016-10-24 LAB — BUN/CREATININE RATIO
BUN/Creatinine Ratio: 17.4 Ratio (ref 6–22)
BUN: 15 mg/dL (ref 7–25)
Creat: 0.86 mg/dL (ref 0.70–1.11)
GFR, Est African American: >89 mL/min (ref >=60)
GFR, Est Non African American: 81 mL/min (ref >=60)

## 2016-10-24 LAB — SEDIMENTATION RATE: Sed Rate: 11 mm/hr (ref 0–20)

## 2016-10-24 NOTE — Telephone Encounter (Signed)
Spoke with pt wife, [name], relaying message below.  She was appreciative.

## 2016-10-24 NOTE — Telephone Encounter (Signed)
Patient wife called about if you had made the appt at [facility] imaging yet she states that you were to make the appt yesterday and call them to let them know when it was for

## 2016-10-24 NOTE — Telephone Encounter (Signed)
Patient's wife called to let you know that Troy Combs has an appointment for [place] on 11/07/16 at 2:40 for an MRI of both Back and Brain. Thanks

## 2016-10-24 NOTE — Telephone Encounter (Signed)
-----   Message from Cameron Sprang, MD sent at 10/24/2016 11:45 AM EDT ----- Pls let him know bloodwork normal, no inflammation seen. Thanks

## 2016-10-25 ENCOUNTER — Ambulatory Visit: Payer: Medicare Other | Admitting: Nurse Practitioner

## 2016-11-07 ENCOUNTER — Ambulatory Visit
Admission: RE | Admit: 2016-11-07 | Discharge: 2016-11-07 | Disposition: A | Discharge status: Home / Self Care | Payer: Medicare Other | Source: Ambulatory Visit | Attending: Neurology | Admitting: Neurology

## 2016-11-07 ENCOUNTER — Telehealth: Payer: Self-pay | Admitting: Neurology

## 2016-11-07 DIAGNOSIS — R51 Headache: Secondary | ICD-10-CM

## 2016-11-07 DIAGNOSIS — I951 Orthostatic hypotension: Secondary | ICD-10-CM

## 2016-11-07 DIAGNOSIS — G9600 Cerebrospinal fluid leak, unspecified: Secondary | ICD-10-CM

## 2016-11-07 DIAGNOSIS — G9782 Other postprocedural complications and disorders of nervous system: Secondary | ICD-10-CM

## 2016-11-07 DIAGNOSIS — R519 Headache, unspecified: Secondary | ICD-10-CM

## 2016-11-07 MED ORDER — GADOBENATE DIMEGLUMINE 529 MG/ML IV SOLN
15.0000 mL | Freq: Once | INTRAVENOUS | Status: AC | PRN
  Administered 2016-11-07: 15 mL via INTRAVENOUS

## 2016-11-07 NOTE — Telephone Encounter (Signed)
PT's wife called and wants to know how long it takes to get MRI results after having the procedure done

## 2016-11-11 ENCOUNTER — Ambulatory Visit: Payer: Medicare Other | Admitting: Neurology

## 2016-11-11 NOTE — Telephone Encounter (Signed)
Spoke with pt, relaying message below.  Pt aware of appointment on Wednesday, July 18 @ 3:30PM.

## 2016-11-11 NOTE — Telephone Encounter (Signed)
-----   Message from Cameron Sprang, MD sent at 11/11/2016  9:25 AM EDT ----- Pls let patient and wife know that the MRI brain looks fine, the MRI spine does not show any clear evidence of spinal fluid leak. Tests still do not explain why his BP drops so low, can he come in Wed at 3:30pm to discuss next steps? Thanks

## 2016-11-13 ENCOUNTER — Ambulatory Visit (INDEPENDENT_AMBULATORY_CARE_PROVIDER_SITE_OTHER): Payer: Medicare Other | Admitting: Neurology

## 2016-11-13 VITALS — Ht 69.0 in | Wt 175.0 lb

## 2016-11-13 DIAGNOSIS — G629 Polyneuropathy, unspecified: Secondary | ICD-10-CM | POA: Diagnosis not present

## 2016-11-13 DIAGNOSIS — I951 Orthostatic hypotension: Secondary | ICD-10-CM | POA: Diagnosis not present

## 2016-11-13 MED ORDER — PYRIDOSTIGMINE BROMIDE 60 MG PO TABS: ORAL_TABLET | ORAL | 6 refills | Status: DC

## 2016-11-13 NOTE — Progress Notes (Signed)
NEUROLOGY FOLLOW UP OFFICE NOTE  Troy Combs [id number]  HISTORY OF PRESENT ILLNESS: I had the pleasure of seeing Troy Combs in follow-up in the neurology clinic on 11/13/2016. He is again accompanied by his wife who helps supplement the history today. The patient was last seen 81 years ago for orthostatic hypotension that worsened after lumbar surgery in April 2018. They continue their BP and HR calendar, he continues to have orthostatic hypotension with rise in heart rate (although not as dramatic as BP). He denies any syncopal episodes. He states he can move around but feels debilitated by the dizziness. He has not gone out of the house much and stopped going to church. He takes midodrine 10mg  every 4 hours when awake. He tried the compression stockings and abdominal binder, elevating head at night, but has not noticed much improvement in symptoms. He is noticing more tremors in both hands, more with his right because he uses it more. He notices it when holding a glass. It does not affect writing as much. When resting, he is noticing small muscle twitches that he sometimes does not see on the surface but feels more internally, over his back, legs, and arms. No change in paresthesias in both feet. His wife reports sleeping up to 11 hours at night and loss of interest in hobbies, and wonders about depression. He is taking Zoloft. He has been taking nortriptyline 25mg  qhs for many years for diplopia. He was reporting headaches every other day, non-positional in nature, these are better but he still has occasional pressure, no nausea/vomiting.  I personally reviewed MRI brain with and without contrast which did not show any acute changes. There was mild chronic microvascular disease. No evidence of cerebellar or brainstem sagging, no abnormal enhancement, no imaging findings of intracranial hypotension. His MRI lumbar spine with and without contrast showed post-surgical changes, very small fluid  collection at the laminectomy site, not unusual and not definitely indicative of a CSF leak.   HPI 02/2015: This is a pleasant 83 yo RH man who presented for vertigo and headaches. He had been previously seen by one of my partners, Dr. [name] in 2014 for cervicogenic headaches. He had vertigo that started in April/May 2016. He would have a sensation of tilting when he sat on the edge of the bed, with violent vomiting. He was prescribed meclizine, which would help sometimes. There was concern this was due to his beta-blocker, and after adjustment, he reported the intense dizziness went away, but he still had dizziness if moving too quickly, turning his head, or standing up. He was in the ER in September 2016 due to significant vertigo with dull headache. He had an MRI brain without contrast which I personally reviewed, no acute changes, there was chronic microvascular disease and generalized atrophy. He saw his PCP and was referred for vestibular rehab. He reports that vestibular rehab worked almost immediately. The dizziness has since resolved. He has a little lightheadedness and feels off balance and unsteady, no falls. He has occasional tingling and numbness in the last 3 digits of both feet, and occasionally pins and needles sensation in the soles of his feet. He has had monocular vertical diplopia for many years, and noticed that this was less when he takes nortriptyline. He had been having occipital headaches, but reported those are pretty much gone, with 1 to 2 over 10 pain, no associated nausea, vomiting, photo/phonophobia. He has some neck crepitus, "like gravel when turning my head," and occasional low  back pain. He has some constipation. No family history of similar symptoms.  He presented 10/23/16 for different symptoms that he and his wife report started after back surgery in April 2018. On review of notes, he had a durotomy while retracting the thecal sac. He was patched and kept on bedrest, and there  was no evidence of leak through the skin. He was discharged to rehab, and while in rehab he was noted to have orthostatic hypotension, with significant drop with exertion and associated dizziness. He was seen a few days later by Cardiology, and there is note that he has been known to have orthostatic hypotension and complained of exertional dizziness in the past. He agreed this was the case, but since surgery, symptoms have been much worse. He could not get to the door without feeling like he would pass out. He was noted to have a drop in SBP from 143 to 90 in the office. Coreg was discontinued. He was started on Florinef 0.1mg  daily. He saw cardiologist [name] last month, with note of orthostatic hypotension antedating back surgery, mechanism unclear but likely related to long-standing systolic hypertension. His supine pressures were over 170, and Losartan was started. Abdominal binder was advised. He continued to have worsening of dizziness with any activity, and was admitted to [facility] on 09/20/16, with note of 50-point drop from supine to standing. He became hypertensive in the ER. Florinef and BPH (Finasteride and Flomax) medications were discontinued and Losartan dose reduced, with note of improvement in orthostatic hypotension. Since hospital discharge, he would have very labile BP changes, 161/93 supine, 79/54 standing. They called [name] who recommended increasing Losartan back to 50mg , and restarting Flomax. He was using the abdominal binder. He was advised to start ProAmantine 5mg  every 4 hours while awake, then increased to 10mg  q4hrs. He reported wearing an abdominal binder, using compression stocking, taking midodrone, and stopping finasteride, but symptoms continue to worsen. He was referred to Neurology due to symptoms worsening after Neurosurgery with CSF leak.   He reports that he does not think he was getting this significant dizziness before the surgery. He described the dizziness as  "disorientation more than anything." He has not passed out. He has been having headaches but they are not positional in nature. They were more severe in rehab, still occurring every other day, improving with 2 Equates (aspirin, acetaminophen, caffeine). He feels pressure sometimes in the back of his head or diffusely, no associated nausea/vomiting, photo/phonophobia, it does not seem to improve when supine. He has chronic tinnitus that is unchanged. He has a history of peripheral neuropathy and denies any numbness or tingling in his extremities, but has new symptoms of muscle spasms in his arms, legs and back for the past 2 weeks. He has chronic vertical diplopia that is better with nortriptyline. He denies any dysphagia, dysarthria, bowel/bladder dysfunction, no back pain. They bring a calendar of his BP readings, this morning he went from 138/88 sitting (HR 82) to 80/52 standing (HR 97).    PAST MEDICAL HISTORY: Past Medical History:  Diagnosis Date  . Allergic rhinitis 09/20/2014  . Anemia, unspecified   . Arthritis   . Bladder neck obstruction   . Cataracts, bilateral   . Coronary atherosclerosis of unspecified type of vessel, native or graft   . Diminished hearing, bilateral   . Dysthymic disorder   . GERD (gastroesophageal reflux disease)   . Heart murmur    as a child  . Impaired glucose tolerance 12/02/2012  .  Impotence of organic origin   . Lumbar radiculopathy   . Malignant melanoma of skin of upper limb, including shoulder ([place])   . Old myocardial infarction   . Personal history of malignant melanoma of skin   . Personal history of other diseases of digestive system   . Thoracic or lumbosacral neuritis or radiculitis, unspecified   . Unspecified essential hypertension     MEDICATIONS: Current Outpatient Prescriptions on File Prior to Visit  Medication Sig Dispense Refill  . albuterol (PROVENTIL HFA;VENTOLIN HFA) 108 (90 BASE) MCG/ACT inhaler Inhale 2 puffs into the lungs every  6 (six) hours as needed for wheezing or shortness of breath. 1 Inhaler 11  . aspirin EC 81 MG tablet Take 81 mg by mouth at bedtime.     Marland Kitchen losartan (COZAAR) 50 MG tablet Take 1 tablet (50 mg total) by mouth daily. 90 tablet 3  . midodrine (PROAMATINE) 5 MG tablet     . midodrine (PROAMATINE) 5 MG tablet Take 2 tablets 10 mg by mouth every 4 hours while awake    . nitroGLYCERIN (NITROSTAT) 0.4 MG SL tablet Place 1 tablet (0.4 mg total) under the tongue every 5 (five) minutes as needed for chest pain (x 3 doses). Reported on 06/15/2015 25 tablet 6  . nortriptyline (PAMELOR) 25 MG capsule Take 1 capsule (25 mg total) by mouth at bedtime. 90 capsule 2  . omeprazole (PRILOSEC) 20 MG capsule Take 20 mg by mouth daily as needed (acid reflux).     . rosuvastatin (CRESTOR) 10 MG tablet Take 10 mg by mouth at bedtime.     . sertraline (ZOLOFT) 50 MG tablet Take 50 mg by mouth at bedtime.    . tamsulosin (FLOMAX) 0.4 MG CAPS capsule Take 1 capsule (0.4 mg total) by mouth daily after supper. 90 capsule 3   No current facility-administered medications on file prior to visit.     ALLERGIES: Allergies  Allergen Reactions  . Iohexol Other (See Comments)     Code: HIVES, Desc: PATENT STATES HE IS ALLERGIC TO IV DYE 09/14/08/RM, Onset Date: [id number]   . Ciprofloxacin Hives and Rash    "Patient unsure" just know he has a reaction to med Medical record indicates allergic reaction  . Other     UNSPECIFIED REACTION  Angioplasty dye pt.>> Pt only recalls Had "very bad reaction"    FAMILY HISTORY: Family History  Problem Relation Age of Onset  . Heart disease Father   . Ataxia Neg Hx   . Chorea Neg Hx   . Dementia Neg Hx   . Mental retardation Neg Hx   . Migraines Neg Hx   . Multiple sclerosis Neg Hx   . Neurofibromatosis Neg Hx   . Neuropathy Neg Hx   . Parkinsonism Neg Hx   . Seizures Neg Hx   . Stroke Neg Hx     SOCIAL HISTORY: Social History   Social History  . Marital status: Married     Spouse name: N/A  . Number of children: 5  . Years of education: N/A   Occupational History  . Not on file.   Social History Main Topics  . Smoking status: Former Smoker    Quit date: 09/21/1955  . Smokeless tobacco: Never Used  . Alcohol use 0.0 oz/week     Comment: one or two ounces a week  . Drug use: No  . Sexual activity: Not on file   Other Topics Concern  . Not on file   Social  History Narrative   Lives with wife in a one story home.  Has 5 children.  Retired Freight forwarder for [employer].  Education: 2 years of college.     REVIEW OF SYSTEMS: Constitutional: No fevers, chills, or sweats, no generalized fatigue, change in appetite Eyes: No visual changes, double vision, eye pain Ear, nose and throat: No hearing loss, ear pain, nasal congestion, sore throat Cardiovascular: No chest pain, palpitations Respiratory:  No shortness of breath at rest or with exertion, wheezes GastrointestinaI: No nausea, vomiting, diarrhea, abdominal pain, fecal incontinence Genitourinary:  No dysuria, urinary retention or frequency Musculoskeletal:  No neck pain, back pain Integumentary: No rash, pruritus, skin lesions Neurological: as above Psychiatric: No depression, insomnia, anxiety Endocrine: No palpitations, fatigue, diaphoresis, mood swings, change in appetite, change in weight, increased thirst Hematologic/Lymphatic:  No anemia, purpura, petechiae. Allergic/Immunologic: no itchy/runny eyes, nasal congestion, recent allergic reactions, rashes  PHYSICAL EXAM: There were no vitals filed for this visit.  Orthostatic VS for the past 24 hrs:  BP- Lying Pulse- Lying BP- Sitting Pulse- Sitting BP- Standing at 0 minutes Pulse- Standing at 0 minutes  11/13/16 1536 152/86 85 130/72 91 124/68 94    General: No acute distress Head:  Normocephalic/atraumatic Neck: supple, no paraspinal tenderness, full range of motion Heart:  Regular rate and rhythm Lungs:  Clear to auscultation bilaterally Back: No  paraspinal tenderness Skin/Extremities: No rash, no edema Neurological Exam: alert and oriented to person, place, and time. No aphasia or dysarthria. Fund of knowledge is appropriate.  Recent and remote memory are intact.  Attention and concentration are normal.    Able to name objects and repeat phrases. Cranial nerves: Pupils equal, round, reactive to light. Extraocular movements intact with no nystagmus. Visual fields full. Facial sensation intact. No facial asymmetry. Tongue, uvula, palate midline.  Motor: Bulk and tone normal, no cogwheeling, muscle strength 5/5 throughout with no pronator drift.  Sensation intact to all modalities on both UE, decreased vibration to hip bilaterally, decreased joint position sense to ankles bilaterally, decreased pin on right calf, decreased cold up to both calves. Deep tendon reflexes +1 on both UE and patella, +2 ankles. Upgoing toe on right. Finger to nose testing intact.  Gait slow and cautious, decreased arm swing bilaterally, no ataxia, he gets dizzy on standing. Romberg positive sway. No resting tremor, he has a mild high frequency low amplitude action > postural tremor. No micrographia, good spiral drawing (see attached).  IMPRESSION: This is a pleasant 83 yo RH man with a history of hypertension, CAD s/p MI and CABG, orthostatic hypotension, cervicogenic headaches, who presented with significant worsening of orthostatic hypotension since back surgery last April 2018. There was note of durotomy while retracting the thecal sac, but no evidence of leak in the skin on hospital discharge. Neurologic exam showed worsening of peripheral neuropathy from previous visits. MRI brain and lumbar spine did not show any evidence of a CSF leak, no acute changes. The etiology of his symptoms is unclear, with changes occurring after surgery, this may be due to POTS that developed post-surgery. He is taking midodrine 10mg  every 4 hours, and will add on mestinon 30mg  TID x 1 week,  then increase to 60mg  TID. Side effects were discussed. Although he has been taking nortriptyline for many years, he is agreeable to discontinuing medication if this contributes to orthostasis in any way. He was advised to continue using abdominal binder. He was advised to get a walker with seat to help with dizzy spells when  traveling. He has an appointment with [name] in [place] for evaluation of POTS. He will follow-up in 2 months and knows to call for any changes.   Thank you for allowing me to participate in his care.  Please do not hesitate to call for any questions or concerns.    Ellouise Newer, M.D.   CC: Dr. Jenny Reichmann, Dr. [name], [name], Angelena Form, PA-C

## 2016-11-13 NOTE — Patient Instructions (Signed)
1. Start mestinon 30mg : Take 1/2 tablet three times during the daytime for a week, then increase to 1 tablet three times a day 2. Stop the nortriptyline 3. Continue all your medications 4. Proceed with visit with [name] 5. Follow-up in 2 months, call for any changes

## 2016-11-15 ENCOUNTER — Encounter: Payer: Self-pay | Admitting: Neurology

## 2016-11-22 ENCOUNTER — Telehealth: Payer: Self-pay | Admitting: Internal Medicine

## 2016-11-22 DIAGNOSIS — E785 Hyperlipidemia, unspecified: Secondary | ICD-10-CM | POA: Insufficient documentation

## 2016-11-22 DIAGNOSIS — I2510 Atherosclerotic heart disease of native coronary artery without angina pectoris: Secondary | ICD-10-CM | POA: Insufficient documentation

## 2016-11-22 DIAGNOSIS — Z951 Presence of aortocoronary bypass graft: Secondary | ICD-10-CM | POA: Insufficient documentation

## 2016-11-22 NOTE — Telephone Encounter (Signed)
Darlina Guys from [facility] called to let Dr Jenny Reichmann know that the PT is going to go out on Tuesday (11/26/16) to start home services.

## 2016-11-22 NOTE — Telephone Encounter (Signed)
Noted... MD is out of office will forward to MD desktop to see once he returns...Johny Chess

## 2016-11-23 NOTE — Telephone Encounter (Signed)
Note, ok to continue same tx

## 2016-11-26 NOTE — Telephone Encounter (Signed)
Troy Combs from [facility]  [phone]    Need verbal for PT  2 week 1 3 week 4 2 week 4

## 2016-11-27 NOTE — Telephone Encounter (Signed)
Notified Katylen w/MD approval forn PT.../lmb

## 2016-12-08 ENCOUNTER — Encounter: Payer: Self-pay | Admitting: Internal Medicine

## 2016-12-09 ENCOUNTER — Encounter: Payer: Self-pay | Admitting: Internal Medicine

## 2016-12-09 ENCOUNTER — Ambulatory Visit (INDEPENDENT_AMBULATORY_CARE_PROVIDER_SITE_OTHER): Payer: Medicare Other | Admitting: Internal Medicine

## 2016-12-09 ENCOUNTER — Telehealth: Payer: Self-pay | Admitting: Internal Medicine

## 2016-12-09 VITALS — BP 118/62 | HR 108 | Temp 98.4°F | Ht 69.0 in | Wt 172.6 lb

## 2016-12-09 DIAGNOSIS — I951 Orthostatic hypotension: Secondary | ICD-10-CM | POA: Diagnosis not present

## 2016-12-09 DIAGNOSIS — I10 Essential (primary) hypertension: Secondary | ICD-10-CM | POA: Diagnosis not present

## 2016-12-09 DIAGNOSIS — K5732 Diverticulitis of large intestine without perforation or abscess without bleeding: Secondary | ICD-10-CM | POA: Diagnosis not present

## 2016-12-09 MED ORDER — SULFAMETHOXAZOLE-TRIMETHOPRIM 800-160 MG PO TABS: 1.0000 | ORAL_TABLET | Freq: Two times a day (BID) | ORAL | 0 refills | Status: DC

## 2016-12-09 MED ORDER — METRONIDAZOLE 500 MG PO TABS: 500.0000 mg | ORAL_TABLET | Freq: Three times a day (TID) | ORAL | 0 refills | Status: DC

## 2016-12-09 NOTE — Telephone Encounter (Signed)
Patient Name: Troy Combs DOB: 06-09-1931 Initial Comment husband severe stomach pain, diarrhea, cramping Nurse Assessment Nurse: Ronnald Ramp, RN, Miranda Date/Time (Eastern Time): 12/09/2016 12:57:29 PM Confirm and document reason for call. If symptomatic, describe symptoms. ---Caller states her husband is constipated and having stomach pain. Last BM was 2-3 days ago. He was started on Mestinon 2 week ago. Does the patient have any new or worsening symptoms? ---Yes Will a triage be completed? ---Yes Related visit to physician within the last 2 weeks? ---No Does the PT have any chronic conditions? (i.e. diabetes, asthma, etc.) ---Yes List chronic conditions. ---hx of diverticulosis, Orthostatic hypotension, Is this a behavioral health or substance abuse call? ---No Guidelines Guideline Title Affirmed Question Affirmed Notes Constipation [1] Constant abdominal pain AND [2] present > 2 hours Final Disposition User See Physician within 4 Hours (or PCP triage) Ronnald Ramp, RN, Miranda Comments No appt available with PCP or at primary office. Appt scheduled for today at [facility] with Dr. Burnice Logan for 4:15p Referrals [place] - SPECIFY Disagree/Comply:

## 2016-12-09 NOTE — Progress Notes (Signed)
Subjective:    Patient ID: Troy Combs, male    DOB: 01/12/1932, 83 y.o.   MRN: [id number]  HPI  83 year old patient who has a prior history of left-sided acute diverticulitis.  He complains of increasing constipation and abdominal pain over the past few days.  Abdominal pain is described as cramping, maximal in the left lower quadrant which is worsened by activity such as walking and alleviated by rest.  No fever or other constitutional complaints. He does have a history of orthostatic hypotension. He states that narcotics in the past have caused acute mental status changes.  CT scanning of March 2016 confirmed left-sided diverticular disease.  Patient states that pain is very similar to this episode.  Past Medical History:  Diagnosis Date  . Allergic rhinitis 09/20/2014  . Anemia, unspecified   . Arthritis   . Bladder neck obstruction   . Cataracts, bilateral   . Coronary atherosclerosis of unspecified type of vessel, native or graft   . Diminished hearing, bilateral   . Dysthymic disorder   . GERD (gastroesophageal reflux disease)   . Heart murmur    as a child  . Impaired glucose tolerance 12/02/2012  . Impotence of organic origin   . Lumbar radiculopathy   . Malignant melanoma of skin of upper limb, including shoulder ([place])   . Old myocardial infarction   . Personal history of malignant melanoma of skin   . Personal history of other diseases of digestive system   . Thoracic or lumbosacral neuritis or radiculitis, unspecified   . Unspecified essential hypertension      Social History   Social History  . Marital status: Married    Spouse name: N/A  . Number of children: 5  . Years of education: N/A   Occupational History  . Not on file.   Social History Main Topics  . Smoking status: Former Smoker    Quit date: 09/21/1955  . Smokeless tobacco: Never Used  . Alcohol use 0.0 oz/week     Comment: one or two ounces a week  . Drug use: No  . Sexual activity:  Not on file   Other Topics Concern  . Not on file   Social History Narrative   Lives with wife in a one story home.  Has 5 children.  Retired Freight forwarder for [employer].  Education: 2 years of college.     Past Surgical History:  Procedure Laterality Date  . ARTHRODESIS  07/06/2002   of left long finger distal interphalangeal joint with Kirschner wire fixation X 3  . ATRIAL ABLATION SURGERY  05/04/2001   Dr. [name]  . CARDIAC CATHETERIZATION  09/05/2008   Revealing 4 of 4 patent grafts with native multivessel coronary artery disease, EF  of 60% without regional wall motion abnormalities.  Marland Kitchen CATARACT EXTRACTION, BILATERAL    . CHOLECYSTECTOMY    . COLONOSCOPY    . CORONARY ANGIOPLASTY  1993  . CORONARY ARTERY BYPASS GRAFT  10/17/2000   [name]. [name], [place]     AFTER LIVER TRAUMA AND HAND SURGERY  . EYE SURGERY    . HERNIA REPAIR     BILATERAL  . LUMBAR LAMINECTOMY/DECOMPRESSION MICRODISCECTOMY N/A 02/02/2016   Procedure: MICRODISCECTOMY LUMBAR FOUR- LUMBAR FIVE;  Surgeon: Ashok Pall, MD;  Location: [place];  Service: Neurosurgery;  Laterality: N/A;  MICRODISCECTOMY L4-L5  . LUMBAR LAMINECTOMY/DECOMPRESSION MICRODISCECTOMY Right 08/02/2016   Procedure: MICRODISCECTOMY LUMBAR FOUR- LUMBAR FIVE RIGHT;  Surgeon: Ashok Pall, MD;  Location: [facility];  Service: Neurosurgery;  Laterality: Right;  . punctured eardrum      to relieve blood build up   for ear infection  . TONSILLECTOMY      Family History  Problem Relation Age of Onset  . Heart disease Father   . Ataxia Neg Hx   . Chorea Neg Hx   . Dementia Neg Hx   . Mental retardation Neg Hx   . Migraines Neg Hx   . Multiple sclerosis Neg Hx   . Neurofibromatosis Neg Hx   . Neuropathy Neg Hx   . Parkinsonism Neg Hx   . Seizures Neg Hx   . Stroke Neg Hx     Allergies  Allergen Reactions  . Iohexol Other (See Comments)     Code: HIVES, Desc: PATENT STATES HE IS ALLERGIC TO IV DYE 09/14/08/RM, Onset Date:  30160109   . Ciprofloxacin Hives and Rash    "Patient unsure" just know he has a reaction to med Medical record indicates allergic reaction  . Other     UNSPECIFIED REACTION  Angioplasty dye pt.>> Pt only recalls Had "very bad reaction"    Current Outpatient Prescriptions on File Prior to Visit  Medication Sig Dispense Refill  . albuterol (PROVENTIL HFA;VENTOLIN HFA) 108 (90 BASE) MCG/ACT inhaler Inhale 2 puffs into the lungs every 6 (six) hours as needed for wheezing or shortness of breath. 1 Inhaler 11  . aspirin EC 81 MG tablet Take 81 mg by mouth at bedtime.     Marland Kitchen losartan (COZAAR) 50 MG tablet Take 1 tablet (50 mg total) by mouth daily. 90 tablet 3  . midodrine (PROAMATINE) 5 MG tablet     . midodrine (PROAMATINE) 5 MG tablet Take 2 tablets 10 mg by mouth every 4 hours while awake    . nitroGLYCERIN (NITROSTAT) 0.4 MG SL tablet Place 1 tablet (0.4 mg total) under the tongue every 5 (five) minutes as needed for chest pain (x 3 doses). Reported on 06/15/2015 25 tablet 6  . omeprazole (PRILOSEC) 20 MG capsule Take 20 mg by mouth daily as needed (acid reflux).     . pyridostigmine (MESTINON) 60 MG tablet Take 1/2 tablet three times a day for 1 week, then increase to 1 tablet three times a day 90 tablet 6  . rosuvastatin (CRESTOR) 10 MG tablet Take 10 mg by mouth at bedtime.     . sertraline (ZOLOFT) 50 MG tablet Take 50 mg by mouth at bedtime.    . tamsulosin (FLOMAX) 0.4 MG CAPS capsule Take 1 capsule (0.4 mg total) by mouth daily after supper. 90 capsule 3   No current facility-administered medications on file prior to visit.     BP 118/62 (BP Location: Left Arm, Patient Position: Sitting, Cuff Size: Normal)   Pulse (!) 108   Temp 98.4 F (36.9 C) (Oral)   Ht 5\' 9"  (1.753 m)   Wt 172 lb 9.6 oz (78.3 kg)   SpO2 94%   BMI 25.49 kg/m       Review of Systems  Constitutional: Positive for activity change and appetite change. Negative for chills, fatigue and fever.  HENT:  Negative for congestion, dental problem, ear pain, hearing loss, sore throat, tinnitus, trouble swallowing and voice change.   Eyes: Negative for pain, discharge and visual disturbance.  Respiratory: Negative for cough, chest tightness, wheezing and stridor.   Cardiovascular: Negative for chest pain, palpitations and leg swelling.  Gastrointestinal: Positive for abdominal pain. Negative for abdominal distention,  blood in stool, constipation, diarrhea, nausea and vomiting.  Genitourinary: Negative for difficulty urinating, discharge, flank pain, genital sores, hematuria and urgency.  Musculoskeletal: Positive for gait problem. Negative for arthralgias, back pain, joint swelling, myalgias and neck stiffness.  Skin: Negative for rash.  Neurological: Positive for weakness. Negative for dizziness, syncope, speech difficulty, numbness and headaches.  Hematological: Negative for adenopathy. Does not bruise/bleed easily.  Psychiatric/Behavioral: Negative for behavioral problems and dysphoric mood. The patient is not nervous/anxious.        Objective:   Physical Exam  Constitutional: He is oriented to person, place, and time. He appears well-developed and well-nourished. No distress.  comfortable  at rest but activity aggravates abdominal pain Afebrile Blood pressure 100/60  HENT:  Head: Normocephalic.  Right Ear: External ear normal.  Left Ear: External ear normal.  Eyes: Conjunctivae and EOM are normal.  Neck: Normal range of motion.  Cardiovascular: Normal rate, regular rhythm and normal heart sounds.   Pulse rate 90-100  Pulmonary/Chest: Effort normal and breath sounds normal.  Abdominal: Bowel sounds are normal. He exhibits no distension. There is tenderness. There is rebound and guarding.  No distention Mild generalized guarding Mild rebound tenderness Pain, maximal left lower quadrant  Musculoskeletal: Normal range of motion. He exhibits no edema or tenderness.  Neurological: He is  alert and oriented to person, place, and time.  Psychiatric: He has a normal mood and affect. His behavior is normal.          Assessment & Plan:   Acute diverticulitis History of orthostatic hypotension Coronary artery disease  Patient will be treated with dual antibiotic therapy.  This will include metronidazole and Septra DS.  Patient has been intolerant of Cipro in the past Diverticular complications discussed and patient information dispensed.  He reports to the ED if he develops any worsening pain, fever, or vomiting  Follow-up PCP later this week  Nyoka Cowden

## 2016-12-09 NOTE — Patient Instructions (Addendum)
Report to the emergency department if you develop any increase in abdominal pain, fever, chills or progressive weakness or vomiting   Diverticulitis Diverticulitis is inflammation or infection of small pouches in your colon that form when you have a condition called diverticulosis. The pouches in your colon are called diverticula. Your colon, or large intestine, is where water is absorbed and stool is formed. Complications of diverticulitis can include:  Bleeding.  Severe infection.  Severe pain.  Perforation of your colon.  Obstruction of your colon.  What are the causes? Diverticulitis is caused by bacteria. Diverticulitis happens when stool becomes trapped in diverticula. This allows bacteria to grow in the diverticula, which can lead to inflammation and infection. What increases the risk? People with diverticulosis are at risk for diverticulitis. Eating a diet that does not include enough fiber from fruits and vegetables may make diverticulitis more likely to develop. What are the signs or symptoms? Symptoms of diverticulitis may include:  Abdominal pain and tenderness. The pain is normally located on the left side of the abdomen, but may occur in other areas.  Fever and chills.  Bloating.  Cramping.  Nausea.  Vomiting.  Constipation.  Diarrhea.  Blood in your stool.  How is this diagnosed? Your health care provider will ask you about your medical history and do a physical exam. You may need to have tests done because many medical conditions can cause the same symptoms as diverticulitis. Tests may include:  Blood tests.  Urine tests.  Imaging tests of the abdomen, including X-rays and CT scans.  When your condition is under control, your health care provider may recommend that you have a colonoscopy. A colonoscopy can show how severe your diverticula are and whether something else is causing your symptoms. How is this treated? Most cases of diverticulitis are  mild and can be treated at home. Treatment may include:  Taking over-the-counter pain medicines.  Following a clear liquid diet.  Taking antibiotic medicines by mouth for 7-10 days.  More severe cases may be treated at a hospital. Treatment may include:  Not eating or drinking.  Taking prescription pain medicine.  Receiving antibiotic medicines through an IV tube.  Receiving fluids and nutrition through an IV tube.  Surgery.  Follow these instructions at home:  Follow your health care provider's instructions carefully.  Follow a full liquid diet or other diet as directed by your health care provider. After your symptoms improve, your health care provider may tell you to change your diet. He or she may recommend you eat a high-fiber diet. Fruits and vegetables are good sources of fiber. Fiber makes it easier to pass stool.  Take fiber supplements or probiotics as directed by your health care provider.  Only take medicines as directed by your health care provider.  Keep all your follow-up appointments. Contact a health care provider if:  Your pain does not improve.  You have a hard time eating food.  Your bowel movements do not return to normal. Get help right away if:  Your pain becomes worse.  Your symptoms do not get better.  Your symptoms suddenly get worse.  You have a fever.  You have repeated vomiting.  You have bloody or black, tarry stools. This information is not intended to replace advice given to you by your health care provider. Make sure you discuss any questions you have with your health care provider. Document Released: 01/23/2005 Document Revised: 09/21/2015 Document Reviewed: 03/10/2013 Elsevier Interactive Patient Education  2017 [employer].

## 2016-12-10 NOTE — Telephone Encounter (Signed)
shirron to see above and help pt make rov , but if having fever, chills, or severe pain he should consider ED instead

## 2016-12-12 ENCOUNTER — Encounter: Payer: Self-pay | Admitting: Internal Medicine

## 2016-12-12 ENCOUNTER — Ambulatory Visit (INDEPENDENT_AMBULATORY_CARE_PROVIDER_SITE_OTHER): Payer: Medicare Other | Admitting: Internal Medicine

## 2016-12-12 ENCOUNTER — Telehealth: Payer: Self-pay | Admitting: Internal Medicine

## 2016-12-12 VITALS — BP 114/66 | HR 69 | Temp 97.5°F | Ht 69.0 in | Wt 173.0 lb

## 2016-12-12 DIAGNOSIS — R110 Nausea: Secondary | ICD-10-CM | POA: Diagnosis not present

## 2016-12-12 DIAGNOSIS — K5900 Constipation, unspecified: Secondary | ICD-10-CM | POA: Insufficient documentation

## 2016-12-12 DIAGNOSIS — K5792 Diverticulitis of intestine, part unspecified, without perforation or abscess without bleeding: Secondary | ICD-10-CM | POA: Diagnosis not present

## 2016-12-12 DIAGNOSIS — I951 Orthostatic hypotension: Secondary | ICD-10-CM

## 2016-12-12 MED ORDER — LACTULOSE 20 GM/30ML PO SOLN: ORAL | 0 refills | Status: DC

## 2016-12-12 NOTE — Assessment & Plan Note (Signed)
Clinically improving, ok to continue current antibx tx

## 2016-12-12 NOTE — Assessment & Plan Note (Signed)
For cont'd antiemetic prn,  to f/u any worsening symptoms or concerns

## 2016-12-12 NOTE — Assessment & Plan Note (Signed)
To avoid any enema for now given the diverticulitis, for lactulose asd,  to f/u any worsening symptoms or concerns, also for acute abd series in AM (closed now)

## 2016-12-12 NOTE — Progress Notes (Signed)
Subjective:    Patient ID: Troy Combs, male    DOB: April 17, 1932, 83 y.o.   MRN: [id number]  HPI  Here to f/u after recent eval and tx for LLQ likely acute diverticulitis with bactrim and flagyl (allergy to cipro); pain to start was about 9-10/10, now improved to 2/10.  Is still assoc with nausea without vomiting, constant, but no radiation of pain, BRB, fever, chills.  Unfortunately also has not had a BM for 8 days, though seems to take po ok, had a normal meal earlier today. Anti-emetic seems to help.  Appetite improving  Denies worsening reflux, other abd pain, or dysphagia.  Did just start northera yesterday for neurogenic orthostatic hypotension ([place]) and seems to be tolerating ok. Pt denies chest pain, increased sob or doe, wheezing, orthopnea, PND, increased LE swelling, palpitations, or syncope. Past Medical History:  Diagnosis Date  . Allergic rhinitis 09/20/2014  . Anemia, unspecified   . Arthritis   . Bladder neck obstruction   . Cataracts, bilateral   . Coronary atherosclerosis of unspecified type of vessel, native or graft   . Diminished hearing, bilateral   . Dysthymic disorder   . GERD (gastroesophageal reflux disease)   . Heart murmur    as a child  . Impaired glucose tolerance 12/02/2012  . Impotence of organic origin   . Lumbar radiculopathy   . Malignant melanoma of skin of upper limb, including shoulder ([place])   . Old myocardial infarction   . Personal history of malignant melanoma of skin   . Personal history of other diseases of digestive system   . Thoracic or lumbosacral neuritis or radiculitis, unspecified   . Unspecified essential hypertension    Past Surgical History:  Procedure Laterality Date  . ARTHRODESIS  07/06/2002   of left long finger distal interphalangeal joint with Kirschner wire fixation X 3  . ATRIAL ABLATION SURGERY  05/04/2001   Dr. [name]  . CARDIAC CATHETERIZATION  09/05/2008   Revealing 4 of 4 patent grafts with native  multivessel coronary artery disease, EF  of 60% without regional wall motion abnormalities.  Marland Kitchen CATARACT EXTRACTION, BILATERAL    . CHOLECYSTECTOMY    . COLONOSCOPY    . CORONARY ANGIOPLASTY  1993  . CORONARY ARTERY BYPASS GRAFT  10/17/2000   [name]. [name], [place]     AFTER LIVER TRAUMA AND HAND SURGERY  . EYE SURGERY    . HERNIA REPAIR     BILATERAL  . LUMBAR LAMINECTOMY/DECOMPRESSION MICRODISCECTOMY N/A 02/02/2016   Procedure: MICRODISCECTOMY LUMBAR FOUR- LUMBAR FIVE;  Surgeon: Ashok Pall, MD;  Location: [place] [facility];  Service: Neurosurgery;  Laterality: N/A;  MICRODISCECTOMY L4-L5  . LUMBAR LAMINECTOMY/DECOMPRESSION MICRODISCECTOMY Right 08/02/2016   Procedure: MICRODISCECTOMY LUMBAR FOUR- LUMBAR FIVE RIGHT;  Surgeon: Ashok Pall, MD;  Location: [place];  Service: Neurosurgery;  Laterality: Right;  . punctured eardrum      to relieve blood build up   for ear infection  . TONSILLECTOMY      reports that he quit smoking about 61 years ago. He has never used smokeless tobacco. He reports that he drinks alcohol. He reports that he does not use drugs. family history includes Heart disease in his father. Allergies  Allergen Reactions  . Iohexol Other (See Comments)     Code: HIVES, Desc: PATENT STATES HE IS ALLERGIC TO IV DYE 09/14/08/RM, Onset Date: [id number]   . Ciprofloxacin Hives and Rash    "Patient unsure" just know he  has a reaction to med Medical record indicates allergic reaction  . Other     UNSPECIFIED REACTION  Angioplasty dye pt.>> Pt only recalls Had "very bad reaction"   Current Outpatient Prescriptions on File Prior to Visit  Medication Sig Dispense Refill  . albuterol (PROVENTIL HFA;VENTOLIN HFA) 108 (90 BASE) MCG/ACT inhaler Inhale 2 puffs into the lungs every 6 (six) hours as needed for wheezing or shortness of breath. 1 Inhaler 11  . aspirin EC 81 MG tablet Take 81 mg by mouth at bedtime.     Marland Kitchen losartan (COZAAR) 50 MG tablet Take 1 tablet (50 mg  total) by mouth daily. 90 tablet 3  . metroNIDAZOLE (FLAGYL) 500 MG tablet Take 1 tablet (500 mg total) by mouth 3 (three) times daily. 21 tablet 0  . midodrine (PROAMATINE) 5 MG tablet     . midodrine (PROAMATINE) 5 MG tablet Take 2 tablets 10 mg by mouth every 4 hours while awake    . nitroGLYCERIN (NITROSTAT) 0.4 MG SL tablet Place 1 tablet (0.4 mg total) under the tongue every 5 (five) minutes as needed for chest pain (x 3 doses). Reported on 06/15/2015 25 tablet 6  . omeprazole (PRILOSEC) 20 MG capsule Take 20 mg by mouth daily as needed (acid reflux).     . pyridostigmine (MESTINON) 60 MG tablet Take 1/2 tablet three times a day for 1 week, then increase to 1 tablet three times a day 90 tablet 6  . rosuvastatin (CRESTOR) 10 MG tablet Take 10 mg by mouth at bedtime.     . sertraline (ZOLOFT) 50 MG tablet Take 50 mg by mouth at bedtime.    . sulfamethoxazole-trimethoprim (BACTRIM DS,SEPTRA DS) 800-160 MG tablet Take 1 tablet by mouth 2 (two) times daily. 14 tablet 0  . tamsulosin (FLOMAX) 0.4 MG CAPS capsule Take 1 capsule (0.4 mg total) by mouth daily after supper. 90 capsule 3   No current facility-administered medications on file prior to visit.    Review of Systems  Constitutional: Negative for other unusual diaphoresis or sweats HENT: Negative for ear discharge or swelling Eyes: Negative for other worsening visual disturbances Respiratory: Negative for stridor or other swelling  Gastrointestinal: Negative for worsening distension or other blood Genitourinary: Negative for retention or other urinary change Musculoskeletal: Negative for other MSK pain or swelling Skin: Negative for color change or other new lesions Neurological: Negative for worsening tremors and other numbness  Psychiatric/Behavioral: Negative for worsening agitation or other fatigue All other system neg per pt    Objective:   Physical Exam BP 114/66   Pulse 69   Temp (!) 97.5 F (36.4 C)   Ht 5\' 9"  (1.753 m)    Wt 173 lb (78.5 kg)   SpO2 98%   BMI 25.55 kg/m  VS noted,  Constitutional: Pt appears in NAD HENT: Head: NCAT.  Right Ear: External ear normal.  Left Ear: External ear normal.  Eyes: . Pupils are equal, round, and reactive to light. Conjunctivae and EOM are normal Nose: without d/c or deformity Neck: Neck supple. Gross normal ROM Cardiovascular: Normal rate and regular rhythm.   Pulmonary/Chest: Effort normal and breath sounds without rales or wheezing.  Abd:  Soft, ND, + BS, no organomegaly, mild tender LLQ without guarding or rebound, no flank tender Neurological: Pt is alert. At baseline orientation, motor grossly intact Skin: Skin is warm. No rashes, other new lesions, no LE edema Psychiatric: Pt behavior is normal without agitation  No other exam findings  Assessment & Plan:

## 2016-12-12 NOTE — Patient Instructions (Signed)
Please take all new medication as prescribed - the lactulose as needed for constipation  Please continue all other medications as before, including the 2 antibiotics  Please have the pharmacy call with any other refills you may need.  Please keep your appointments with your specialists as you may have planned  Please go to the XRAY Department in the Basement (go straight as you get off the elevator) for the x-ray testing - tomorrow  You will be contacted by phone if any changes need to be made immediately.  Otherwise, you will receive a letter about your results with an explanation, but please check with MyChart first.  Please remember to sign up for MyChart if you have not done so, as this will be important to you in the future with finding out test results, communicating by private email, and scheduling acute appointments online when needed.

## 2016-12-12 NOTE — Assessment & Plan Note (Signed)
Ok to [employer] as not likely related

## 2016-12-12 NOTE — Telephone Encounter (Signed)
Patient Name: Troy Combs DOB: Oct 01, 1931 Initial Comment Caller states he hasn't had a BM in 10-14 days. Nurse Assessment Nurse: Joline Salt, RN, Malachy Mood Date/Time Eilene Ghazi Time): 12/12/2016 2:06:19 PM Confirm and document reason for call. If symptomatic, describe symptoms. ---Caller states he hasn't had a BM in 10-14 days. Has diverticulitis and was antibiotics. Does the patient have any new or worsening symptoms? ---Yes Will a triage be completed? ---Yes Related visit to physician within the last 2 weeks? ---Yes Does the PT have any chronic conditions? (i.e. diabetes, asthma, etc.) ---Yes List chronic conditions. ---Diverticulitis, neurogenic hyperstatic hypotension. Is this a behavioral health or substance abuse call? ---No Guidelines Guideline Title Affirmed Question Affirmed Notes Constipation Abdomen is more swollen than usual Final Disposition User See Physician within 24 Hours [place], RN, Malachy Mood Comments Appt scheduled for today at 6pm with PCP Referrals REFERRED TO PCP OFFICE Disagree/Comply: Comply

## 2016-12-13 ENCOUNTER — Ambulatory Visit (INDEPENDENT_AMBULATORY_CARE_PROVIDER_SITE_OTHER)
Admission: RE | Admit: 2016-12-13 | Discharge: 2016-12-13 | Disposition: A | Discharge status: Home / Self Care | Payer: Medicare Other | Source: Ambulatory Visit | Attending: Internal Medicine | Admitting: Internal Medicine

## 2016-12-13 DIAGNOSIS — K5900 Constipation, unspecified: Secondary | ICD-10-CM

## 2016-12-13 DIAGNOSIS — K5792 Diverticulitis of intestine, part unspecified, without perforation or abscess without bleeding: Secondary | ICD-10-CM

## 2016-12-13 DIAGNOSIS — R110 Nausea: Secondary | ICD-10-CM

## 2016-12-16 ENCOUNTER — Telehealth: Payer: Self-pay | Admitting: Internal Medicine

## 2016-12-16 NOTE — Telephone Encounter (Signed)
States patient is currently being treated for diverticulitis.  States that patient does not feel like performing PT this week.  Is requesting a hold for a week be placed on PT?

## 2016-12-16 NOTE — Telephone Encounter (Signed)
Called [facility] no answer LMOM w/MD response.Marland KitchenJohny Chess

## 2016-12-16 NOTE — Telephone Encounter (Signed)
Ok with me 

## 2016-12-20 ENCOUNTER — Encounter: Payer: Self-pay | Admitting: *Deleted

## 2016-12-23 ENCOUNTER — Ambulatory Visit (INDEPENDENT_AMBULATORY_CARE_PROVIDER_SITE_OTHER): Payer: Medicare Other | Admitting: Cardiology

## 2016-12-23 VITALS — BP 128/78 | HR 78 | Ht 69.0 in | Wt 176.0 lb

## 2016-12-23 DIAGNOSIS — Z951 Presence of aortocoronary bypass graft: Secondary | ICD-10-CM | POA: Diagnosis not present

## 2016-12-23 DIAGNOSIS — I10 Essential (primary) hypertension: Secondary | ICD-10-CM

## 2016-12-23 DIAGNOSIS — E782 Mixed hyperlipidemia: Secondary | ICD-10-CM

## 2016-12-23 DIAGNOSIS — I951 Orthostatic hypotension: Secondary | ICD-10-CM | POA: Diagnosis not present

## 2016-12-23 NOTE — Addendum Note (Signed)
Addended by: Marlis Edelson C on: 12/23/2016 05:03 PM   Modules accepted: Orders

## 2016-12-23 NOTE — Patient Instructions (Signed)
Your physician recommends that you continue on your current medications as directed. Please refer to the Current Medication list given to you today.    Your physician recommends that you schedule a follow-up appointment in: 3 MONTHS WITH DR NELSON  

## 2016-12-23 NOTE — Progress Notes (Signed)
Cardiology Office Note    Date:  12/23/2016   ID:  Troy Combs, DOB Jun 24, 1931, MRN [id number]  PCP:  Troy Borg, MD  Cardiologist:  Dr. Meda Combs Dr. Caryl Combs  CC: orthostatic hypotension  History of Present Illness:  Troy Combs is a 83 y.o. male with a history of CAD s/p CABG x4V (2002), HTN, atrial flutter, known orthostatic hypotension who presents to clinic for evaluation of orthostatic hypotension.   The last cath in 5/10 with patent L-LAD, S-RCA, S-OM1/OM2, EF 60%.   In the past his Toprol XL was increased to 100 mg PO daily for symptomatic PVCs, but later decreased to 50 mg po daily for fatigue and dizziness. He was later switched to Coreg with improvement in symptoms (fatigue).  He has been known to have orthostatic hypotension but doesn't comply with salt and fluid liberalization. He doesn't like water and drinks a lot of Combs. He has complained of exertional dizziness in the past.   He was sen by Dr. Meda Combs in 2016. He complained of worsening DOE. Exercise Myoview stress test was low risk but did show chronotropic incompetence and it was switched to lexiscan. 2D ECHO at that time showed vigorous LV function (EF 65-70%), G1DD and mild bileaflet MVP.  48 hour holter monitor in 04/2015 showed very infrequent PVCS and PACs.   Seen back by Dr. Meda Combs in 04/2016 and having worsening DOE. Another stress test was ordered which was low risk.  He was admitted 4/6-4/16/18 for redo disectomy at L4/5 on the right side. He had a durotomy with CSF leak while retracting the thecal sac. He was patched and kept on bedrest. He was discharged to [facility] for rehab.  He was added onto my schedule on 08/26/16 for evaluation of worsening orthostatic hypotension, especially during his PT sessions. I discontinued his coreg 3.125mg  BID and started Florinef. I also advised that he wear his compression stockings and liberalize salt and fluids and send him to Dr. Caryl Combs.   He saw  Dr. Caryl Combs on 09/17/16 who felt orthostatic hypotension was likely related to longstanding systolic HTN. Given his supine HTN he was started on losartan 50mg  at night and stop Florinef. He also recommended an abdominal binder and stopping his alpha blocker (BPH).   He was then admitted 5/25-5/27/18 for orthostatic hypotension. His flomax and proscar were discontinued and Losartan decreased from 50mg  daily to 25mg  daily. Later he had to resume flomax and proscar due to unacceptable symptoms.   He has been placed on midodrine since that time which has been increased to 10mg  q 4 hours. Dr. Meda Combs recommended [name] but it looks like this may be cost prohibitive. She also recommended that he elevate his bed 10-20 degrees.   12/23/2016 - the patient now follows with specially for autonomic dysfunction in [facility] Dr. Fay Combs, he communicates with the patient through Internet on a weekly basis and adjust his medicines. He is currently on Northera 200 mg PO TID, Midodrine  5 mg po Q4H, and pyridostigmine 60 mg po TID. he has been experiencing significant nausea for the last 6 weeks, he is currently debilitated he's not leaving the house because of that. He denies any falls. He hasn't had any dyspnea on exertion and mild chest pain at rest last night. He feels nauseous today in the clinic his resting blood pressure was 138/86 and heart rate 90 bpm. He was given 8 mg of ondansetron with relief in his symptoms.   Past Medical History:  Diagnosis Date  . Allergic rhinitis 09/20/2014  . Anemia, unspecified   . Arthritis   . Bladder neck obstruction   . Cataracts, bilateral   . Coronary atherosclerosis of unspecified type of vessel, native or graft   . Diminished hearing, bilateral   . Dysthymic disorder   . GERD (gastroesophageal reflux disease)   . Heart murmur    as a child  . Impaired glucose tolerance 12/02/2012  . Impotence of organic origin   . Lumbar radiculopathy   . Malignant melanoma of skin of  upper limb, including shoulder ([place])   . Old myocardial infarction   . Personal history of malignant melanoma of skin   . Personal history of other diseases of digestive system   . Thoracic or lumbosacral neuritis or radiculitis, unspecified   . Unspecified essential hypertension     Past Surgical History:  Procedure Laterality Date  . ARTHRODESIS  07/06/2002   of left long finger distal interphalangeal joint with Kirschner wire fixation X 3  . ATRIAL ABLATION SURGERY  05/04/2001   Dr. [name]  . CARDIAC CATHETERIZATION  09/05/2008   Revealing 4 of 4 patent grafts with native multivessel coronary artery disease, EF  of 60% without regional wall motion abnormalities.  Marland Kitchen CATARACT EXTRACTION, BILATERAL    . CHOLECYSTECTOMY    . COLONOSCOPY    . CORONARY ANGIOPLASTY  1993  . CORONARY ARTERY BYPASS GRAFT  10/17/2000   [name]. [name], [place]     AFTER LIVER TRAUMA AND HAND SURGERY  . EYE SURGERY    . HERNIA REPAIR     BILATERAL  . LUMBAR LAMINECTOMY/DECOMPRESSION MICRODISCECTOMY N/A 02/02/2016   Procedure: MICRODISCECTOMY LUMBAR FOUR- LUMBAR FIVE;  Surgeon: Ashok Pall, MD;  Location: [place];  Service: Neurosurgery;  Laterality: N/A;  MICRODISCECTOMY L4-L5  . LUMBAR LAMINECTOMY/DECOMPRESSION MICRODISCECTOMY Right 08/02/2016   Procedure: MICRODISCECTOMY LUMBAR FOUR- LUMBAR FIVE RIGHT;  Surgeon: Ashok Pall, MD;  Location: [place];  Service: Neurosurgery;  Laterality: Right;  . punctured eardrum      to relieve blood build up   for ear infection  . TONSILLECTOMY      Current Medications: Outpatient Medications Prior to Visit  Medication Sig Dispense Refill  . albuterol (PROVENTIL HFA;VENTOLIN HFA) 108 (90 BASE) MCG/ACT inhaler Inhale 2 puffs into the lungs every 6 (six) hours as needed for wheezing or shortness of breath. 1 Inhaler 11  . aspirin EC 81 MG tablet Take 81 mg by mouth at bedtime.     . Droxidopa (NORTHERA) 100 MG CAPS Take 100 mg by mouth 3  (three) times daily.    Marland Kitchen losartan (COZAAR) 50 MG tablet Take 1 tablet (50 mg total) by mouth daily. 90 tablet 3  . midodrine (PROAMATINE) 5 MG tablet Take 2 tablets 10 mg by mouth every 4 hours while awake    . nitroGLYCERIN (NITROSTAT) 0.4 MG SL tablet Place 1 tablet (0.4 mg total) under the tongue every 5 (five) minutes as needed for chest pain (x 3 doses). Reported on 06/15/2015 25 tablet 6  . omeprazole (PRILOSEC) 20 MG capsule Take 20 mg by mouth daily as needed (acid reflux).     . pyridostigmine (MESTINON) 60 MG tablet Take 1/2 tablet three times a day for 1 week, then increase to 1 tablet three times a day 90 tablet 6  . rosuvastatin (CRESTOR) 10 MG tablet Take 10 mg by mouth at bedtime.     . sertraline (ZOLOFT) 50 MG tablet Take  50 mg by mouth at bedtime.    . sulfamethoxazole-trimethoprim (BACTRIM DS,SEPTRA DS) 800-160 MG tablet Take 1 tablet by mouth 2 (two) times daily. 14 tablet 0  . tamsulosin (FLOMAX) 0.4 MG CAPS capsule Take 1 capsule (0.4 mg total) by mouth daily after supper. 90 capsule 3  . Lactulose 20 GM/30ML SOLN 30-60 ml by mouth twice per day as needed (Patient not taking: Reported on 12/23/2016) 473 mL 0  . metroNIDAZOLE (FLAGYL) 500 MG tablet Take 1 tablet (500 mg total) by mouth 3 (three) times daily. (Patient not taking: Reported on 12/23/2016) 21 tablet 0  . midodrine (PROAMATINE) 5 MG tablet      No facility-administered medications prior to visit.      Allergies:   Iohexol; Ciprofloxacin; and Other   Social History   Social History  . Marital status: Married    Spouse name: N/A  . Number of children: 5  . Years of education: N/A   Social History Main Topics  . Smoking status: Former Smoker    Quit date: 09/21/1955  . Smokeless tobacco: Never Used  . Alcohol use 0.0 oz/week     Comment: one or two ounces a week  . Drug use: No  . Sexual activity: Not on file   Other Topics Concern  . Not on file   Social History Narrative   Lives with wife in a one  story home.  Has 5 children.  Retired Freight forwarder for [employer].  Education: 2 years of college.      Family History:  The patient's family history includes Heart disease in his father.      ROS:   Please see the history of present illness.    ROS All other systems reviewed and are negative.   PHYSICAL EXAM:   VS:  BP 128/78   Pulse 78   Ht 5\' 9"  (1.753 m)   Wt 176 lb (79.8 kg)   BMI 25.99 kg/m    GEN: Well nourished, well developed, in no acute distress  HEENT: normal  Neck: no JVD, carotid bruits, or masses Cardiac: RRR; no murmurs, rubs, or gallops,no edema  Respiratory:  clear to auscultation bilaterally, normal work of breathing GI: soft, nontender, nondistended, + BS MS: no deformity or atrophy  Skin: warm and dry, no rash Neuro:  Alert and Oriented x 3, Strength and sensation are intact Psych: euthymic mood, full affect   Wt Readings from Last 3 Encounters:  12/23/16 176 lb (79.8 kg)  12/12/16 173 lb (78.5 kg)  12/09/16 172 lb 9.6 oz (78.3 kg)      Studies/Labs Reviewed:   EKG:  EKG is NOT ordered today.    Recent Labs: 01/24/2016: TSH 3.15 08/01/2016: ALT 23 09/21/2016: Hemoglobin 12.9; Platelets 148; Potassium 3.8; Sodium 141 10/23/2016: BUN 15; Creat 0.86   Lipid Panel    Component Value Date/Time   CHOL 110 01/24/2016 0955   TRIG 133.0 01/24/2016 0955   TRIG 83 03/18/2006 0839   HDL 49.70 01/24/2016 0955   CHOLHDL 2 01/24/2016 0955   VLDL 26.6 01/24/2016 0955   LDLCALC 34 01/24/2016 0955    Additional studies/ Combs that were reviewed today include:  Echo: 08/16/2014 Study Conclusions - Left ventricle: The cavity size was normal. Wall thickness was increased in a pattern of mild LVH. There was mild focal basal hypertrophy of the septum. Systolic function was vigorous. The estimated ejection fraction was in the range of 65% to 70%. Wall motion was normal; there were no regional  wall motion abnormalities. Doppler parameters are consistent with  abnormal left ventricular relaxation (grade 1 diastolic dysfunction). - Mitral valve: Calcified annulus. Mild prolapse, involving the anterior leaflet and the posterior leaflet. Impressions: - Vigorous LV function; grade 1 diastolic dysfunction; mild bileaflet MVP; trace MR and TR.  Lexiscan stress test: 06/2014 Impression Exercise Capacity: Lexiscan with low level exercise. BP Response: Normal blood pressure response. Clinical Symptoms: No chest pain. ECG Impression: No significant ST segment change suggestive of ischemia. Comparison with Prior Nuclear Study:Previous report was low risk scan  Overall Impression: Probable normal perfusion and minimal apical thnning. No evidence for significant ischemia or scar. Overall low risk scan  LV Ejection Fraction: 66%. LV Wall Motion: NL LV Function; NL Wall Motion   Lesxiscan Myoview 05/07/16 Study Highlights   Nuclear stress EF: 56%.  There was no ST segment deviation noted during stress.  The study is normal.  This is a low risk study.  The left ventricular ejection fraction is normal (55-65%).     48 hour holter monitor 04/2015 Study Highlights  Very frequent monomorphic PVCs - 16.700 in 48 hours. Some couplets.  Infrequent PACs.  Very frequent monomorphic PVCs - 16.700 in 48 hours.   EKG was performed today and personally reviewed it shows normal sinus rhythm with inferior MI age undetermined unchanged from prior.   ASSESSMENT & PLAN:   Symptomatic orthostatic hypotension:  he has been wearing compression stockings, elevating his bed, wearing an abdominal binder, taking midodrine q 4 hours and discontinued Finasteride. His symptoms have continued to worsen.  He is being followed by a specialist in [place], his dose of Northera is still being adjusted currently on 200 mg 3 times a day, I believe that pyridostigmin can be causing significant nausea. He is advised to discuss with his doctor from [facility]  about possibly cutting the dose in half and if there is improvement in nausea and discontinuation of the medicine.   Systemic hypertension: He brings diary from home his blood pressure is taking go as high as 025 systolic.   CAD s/p CABG: stable. Recently nuclear stress test in 04/2016 was low risk for ischemia. No chest pain. Continue ASA, statin. No BB given orthostasis.   PVCs: stable  HLD: continue Crestor, no side effects.  Total time spent with patient was over 40 minutes which included evaluating patient, reviewing record and coordinating care. Face to face time >50%. I discussed case with Dr. Radford Pax and we formulated a plan    Medication Adjustments/Labs and Tests Ordered: Current medicines are reviewed at length with the patient today.  Concerns regarding medicines are outlined above.  Medication changes, Labs and Tests ordered today are listed in the Patient Instructions below. Patient Instructions  Your physician recommends that you continue on your current medications as directed. Please refer to the Current Medication list given to you today.  Your physician recommends that you schedule a follow-up appointment in:  [place], Ena Dawley, MD  12/23/2016 9:39 AM    [facility] [place], [place], [place]  [address] Phone: [phone]; Fax: [phone]

## 2017-01-02 ENCOUNTER — Ambulatory Visit: Payer: Medicare Other | Admitting: Internal Medicine

## 2017-01-14 ENCOUNTER — Ambulatory Visit: Payer: Medicare Other | Admitting: Neurology

## 2017-01-18 ENCOUNTER — Other Ambulatory Visit: Payer: Self-pay | Admitting: Internal Medicine

## 2017-01-21 ENCOUNTER — Telehealth: Payer: Self-pay | Admitting: Internal Medicine

## 2017-01-21 NOTE — Telephone Encounter (Signed)
Wants recertification on Physical therapy,  3week4 2week 5

## 2017-01-21 NOTE — Telephone Encounter (Signed)
Notified Caitlyn w/Md response.../l;mb

## 2017-01-21 NOTE — Telephone Encounter (Signed)
Ok for verbals 

## 2017-02-02 ENCOUNTER — Encounter: Payer: Self-pay | Admitting: Internal Medicine

## 2017-02-02 ENCOUNTER — Encounter: Payer: Self-pay | Admitting: Neurology

## 2017-02-02 ENCOUNTER — Encounter: Payer: Self-pay | Admitting: Cardiology

## 2017-03-10 ENCOUNTER — Other Ambulatory Visit: Payer: Self-pay | Admitting: Internal Medicine

## 2017-03-18 DIAGNOSIS — M5416 Radiculopathy, lumbar region: Secondary | ICD-10-CM | POA: Insufficient documentation

## 2017-03-18 DIAGNOSIS — Z85820 Personal history of malignant melanoma of skin: Secondary | ICD-10-CM | POA: Insufficient documentation

## 2017-03-18 DIAGNOSIS — K219 Gastro-esophageal reflux disease without esophagitis: Secondary | ICD-10-CM | POA: Insufficient documentation

## 2017-03-18 DIAGNOSIS — H9193 Unspecified hearing loss, bilateral: Secondary | ICD-10-CM | POA: Insufficient documentation

## 2017-03-18 DIAGNOSIS — R011 Cardiac murmur, unspecified: Secondary | ICD-10-CM | POA: Insufficient documentation

## 2017-03-18 DIAGNOSIS — H269 Unspecified cataract: Secondary | ICD-10-CM | POA: Insufficient documentation

## 2017-03-18 DIAGNOSIS — M1990 Unspecified osteoarthritis, unspecified site: Secondary | ICD-10-CM | POA: Insufficient documentation

## 2017-03-18 DIAGNOSIS — N320 Bladder-neck obstruction: Secondary | ICD-10-CM | POA: Insufficient documentation

## 2017-03-31 ENCOUNTER — Encounter: Payer: Self-pay | Admitting: *Deleted

## 2017-03-31 ENCOUNTER — Encounter: Payer: Self-pay | Admitting: Cardiology

## 2017-03-31 ENCOUNTER — Ambulatory Visit (INDEPENDENT_AMBULATORY_CARE_PROVIDER_SITE_OTHER): Payer: Medicare Other | Admitting: Cardiology

## 2017-03-31 VITALS — BP 118/62 | HR 75 | Ht 69.0 in | Wt 181.0 lb

## 2017-03-31 DIAGNOSIS — I951 Orthostatic hypotension: Secondary | ICD-10-CM | POA: Diagnosis not present

## 2017-03-31 DIAGNOSIS — I10 Essential (primary) hypertension: Secondary | ICD-10-CM

## 2017-03-31 DIAGNOSIS — Z951 Presence of aortocoronary bypass graft: Secondary | ICD-10-CM | POA: Diagnosis not present

## 2017-03-31 DIAGNOSIS — I493 Ventricular premature depolarization: Secondary | ICD-10-CM | POA: Diagnosis not present

## 2017-03-31 DIAGNOSIS — R0609 Other forms of dyspnea: Secondary | ICD-10-CM

## 2017-03-31 DIAGNOSIS — I2510 Atherosclerotic heart disease of native coronary artery without angina pectoris: Secondary | ICD-10-CM

## 2017-03-31 DIAGNOSIS — R0600 Dyspnea, unspecified: Secondary | ICD-10-CM

## 2017-03-31 DIAGNOSIS — E782 Mixed hyperlipidemia: Secondary | ICD-10-CM

## 2017-03-31 MED ORDER — DIPHENHYDRAMINE HCL 25 MG PO CAPS: ORAL_CAPSULE | ORAL | 0 refills | Status: DC

## 2017-03-31 MED ORDER — PREDNISONE 50 MG PO TABS: ORAL_TABLET | ORAL | 0 refills | Status: DC

## 2017-03-31 NOTE — Patient Instructions (Signed)
Medication Instructions:   TAKE YOUR PREDNISONE 50 MG AS INSTRUCTED, IN PREPARATION FOR YOUR CARDIAC CATH ON 04/03/17.  YOU WILL TAKE PREDNISONE 50 MG BY MOUTH ON 12/5 AT 6:30 PM, THEN TAKE PREDNISONE 50 MG BY MOUTH ON 12/6 AT 12:30 AM, THEN TAKE LAST DOSE OF PREDNISONE 50 MG BY MOUTH ON 12/6 AT 6:30 AM, RIGHT BEFORE YOUR CATH.  TAKE BENADRYL 50 MG BY MOUTH ON 12/6 AT 6:30 AM--TAKE WITH YOUR LAST DOSE OF PREDNISONE, JUST PRIOR TO YOUR CATH.    Labwork:  TODAY---PRE-CATH LABS---CMET, CBC W DIFF, TSH, AND PT/INR    Testing/Procedures:  Your physician has requested that you have a cardiac catheterization. Cardiac catheterization is used to diagnose and/or treat various heart conditions. Doctors may recommend this procedure for a number of different reasons. The most common reason is to evaluate chest pain. Chest pain can be a symptom of coronary artery disease (CAD), and cardiac catheterization can show whether plaque is narrowing or blocking your heart's arteries. This procedure is also used to evaluate the valves, as well as measure the blood flow and oxygen levels in different parts of your heart. For further information please visit [email or web address]. Please follow instruction sheet, as given.  YOUR CATH IS SCHEDULED FOR THIS Thursday 04/03/17 AT 5:30 AM AT [facility] TO DO.  PLEASE FOLLOW INSTRUCTIONS PROVIDED, TO HELP PREPARE YOU FOR THIS PROCEDURE.     Follow-Up:  3 MONTHS WITH DR [name]       If you need a refill on your cardiac medications before your next appointment, please call your pharmacy.

## 2017-03-31 NOTE — H&P (View-Only) (Signed)
Cardiology Office Note    Date:  03/31/2017   ID:  UNIQUE Troy Combs, DOB 1932/03/24, MRN [id number]  PCP:  Troy Contras, MD  Cardiologist:  Dr. Meda Combs Dr. Caryl Combs  CC: orthostatic hypotension, DOE  History of Present Illness:  Troy Combs is a 83 y.o. male with a history of CAD s/p CABG x4V (2002), HTN, atrial flutter, known orthostatic hypotension who presents to clinic for evaluation of orthostatic hypotension.  In the past his Toprol XL was increased to 100 mg PO daily for symptomatic PVCs, but later decreased to 50 mg po daily for fatigue and dizziness. He was later switched to Coreg with improvement in symptoms (fatigue). He was sen by Dr. Meda Combs in 2016. He complained of worsening DOE. Exercise Myoview stress test was low risk but did show chronotropic incompetence and it was switched to lexiscan. 2D ECHO at that time showed vigorous LV function (EF 65-70%), G1DD and mild bileaflet MVP. 48 hour holter monitor in 04/2015 showed very infrequent PVCS and PACs.  Seen back by Dr. Meda Combs in 04/2016 and having worsening DOE. Another stress test was ordered which was low risk.  He saw Dr. Caryl Combs on 09/17/16 who felt orthostatic hypotension was likely related to longstanding systolic HTN. Given his supine HTN he was started on losartan 50mg  at night and stop Florinef. He also recommended an abdominal binder and stopping his alpha blocker (BPH).   He was then admitted 5/25-5/27/18 for orthostatic hypotension. His flomax and proscar were discontinued and Losartan decreased from 50mg  daily to 25mg  daily. Later he had to resume flomax and proscar due to unacceptable symptoms. He has been placed on midodrine since that time which has been increased to 10mg  q 4 hours. Dr. Meda Combs recommended [name] but it looks like this may be cost prohibitive. She also recommended that he elevate his bed 10-20 degrees.   12/23/2016 - the patient now follows with specially for autonomic dysfunction in [facility]  Dr. Margrett Combs, he communicates with the patient through Internet on a weekly basis and adjust his medicines. He is currently on Northera 200 mg PO TID, Midodrine  5 mg po Q4H, and pyridostigmine 60 mg po TID. he has been experiencing significant nausea for the last 6 weeks, he is currently debilitated he's not leaving the house because of that. He denies any falls. He hasn't had any dyspnea on exertion and mild chest pain at rest last night. He feels nauseous today in the clinic his resting blood pressure was 138/86 and heart rate 90 bpm. He was given 8 mg of ondansetron with relief in his symptoms.  03/31/2017 - the patient states that his dyspnea on exertion has been progressively worsening and now even when minimally exerted with activities around the house. He denies any exertional chest pain but had resting chest pain at night in his right shoulder couple weeks ago. He denies any palpitations or syncope. He continues to see a autonomic dysfunction specialist at [facility] for orthostatic hypotension and was started on Northera and advised to eat more salt including placing the teaspoon of salt into the drink and drink it daily to get her with increased amount of fluids. The patient states that these approach has been working for him for a while but lately his blood pressure increases especially toward the end of the day and he has had multiple measurements in 190s.   Past Medical History:  Diagnosis Date  . Allergic rhinitis 09/20/2014  . Anemia, unspecified   . Arthritis   .  Bladder neck obstruction   . Cataracts, bilateral   . Coronary atherosclerosis of unspecified type of vessel, native or graft   . Diminished hearing, bilateral   . Dysthymic disorder   . GERD (gastroesophageal reflux disease)   . Heart murmur    as a child  . Impaired glucose tolerance 12/02/2012  . Impotence of organic origin   . Lumbar radiculopathy   . Malignant melanoma of skin of upper limb, including shoulder ([place])   . Old  myocardial infarction   . Personal history of malignant melanoma of skin   . Personal history of other diseases of digestive system   . Thoracic or lumbosacral neuritis or radiculitis, unspecified   . Unspecified essential hypertension     Past Surgical History:  Procedure Laterality Date  . ARTHRODESIS  07/06/2002   of left long finger distal interphalangeal joint with Kirschner wire fixation X 3  . ATRIAL ABLATION SURGERY  05/04/2001   Dr. [name]  . CARDIAC CATHETERIZATION  09/05/2008   Revealing 4 of 4 patent grafts with native multivessel coronary artery disease, EF  of 60% without regional wall motion abnormalities.  Troy Combs CATARACT EXTRACTION, BILATERAL    . CHOLECYSTECTOMY    . COLONOSCOPY    . CORONARY ANGIOPLASTY  1993  . CORONARY ARTERY BYPASS GRAFT  10/17/2000   Troy Combs. Troy Combs, [place]     AFTER LIVER TRAUMA AND HAND SURGERY  . EYE SURGERY    . HERNIA REPAIR     BILATERAL  . LUMBAR LAMINECTOMY/DECOMPRESSION MICRODISCECTOMY N/A 02/02/2016   Procedure: MICRODISCECTOMY LUMBAR FOUR- LUMBAR FIVE;  Surgeon: Troy Pall, MD;  Location: [facility];  Service: Neurosurgery;  Laterality: N/A;  MICRODISCECTOMY L4-L5  . LUMBAR LAMINECTOMY/DECOMPRESSION MICRODISCECTOMY Right 08/02/2016   Procedure: MICRODISCECTOMY LUMBAR FOUR- LUMBAR FIVE RIGHT;  Surgeon: Troy Pall, MD;  Location: [place] [facility];  Service: Neurosurgery;  Laterality: Right;  . punctured eardrum      to relieve blood build up   for ear infection  . TONSILLECTOMY      Current Medications: Outpatient Medications Prior to Visit  Medication Sig Dispense Refill  . albuterol (PROVENTIL HFA;VENTOLIN HFA) 108 (90 BASE) MCG/ACT inhaler Inhale 2 puffs into the lungs every 6 (six) hours as needed for wheezing or shortness of breath. 1 Inhaler 11  . aspirin EC 81 MG tablet Take 81 mg by mouth at bedtime.     . Droxidopa 300 MG CAPS Take 300 mg by mouth 3 (three) times daily with meals.    Troy Combs losartan (COZAAR) 50  MG tablet Take 1 tablet (50 mg total) by mouth daily. 90 tablet 3  . nitroGLYCERIN (NITROSTAT) 0.4 MG SL tablet Place 1 tablet (0.4 mg total) under the tongue every 5 (five) minutes as needed for chest pain (x 3 doses). Reported on 06/15/2015 25 tablet 6  . omeprazole (PRILOSEC) 20 MG capsule Take 20 mg by mouth daily as needed (acid reflux).     . rosuvastatin (CRESTOR) 10 MG tablet TAKE 1 TABLET DAILY 90 tablet 0  . sertraline (ZOLOFT) 50 MG tablet TAKE 1 TABLET DAILY 90 tablet 3  . tamsulosin (FLOMAX) 0.4 MG CAPS capsule Take 1 capsule (0.4 mg total) by mouth daily after supper. 90 capsule 3  . Droxidopa (NORTHERA) 100 MG CAPS Take 100 mg by mouth 3 (three) times daily.    . midodrine (PROAMATINE) 5 MG tablet Take 2 tablets 10 mg by mouth every 4 hours while awake    . pyridostigmine (  MESTINON) 60 MG tablet Take 1/2 tablet three times a day for 1 week, then increase to 1 tablet three times a day (Patient not taking: Reported on 03/31/2017) 90 tablet 6  . sulfamethoxazole-trimethoprim (BACTRIM DS,SEPTRA DS) 800-160 MG tablet Take 1 tablet by mouth 2 (two) times daily. (Patient not taking: Reported on 03/31/2017) 14 tablet 0   No facility-administered medications prior to visit.      Allergies:   Iohexol; Ciprofloxacin; and Other   Social History   Socioeconomic History  . Marital status: Married    Spouse name: None  . Number of children: 5  . Years of education: None  . Highest education level: None  Social Needs  . Financial resource strain: None  . Food insecurity - worry: None  . Food insecurity - inability: None  . Transportation needs - medical: None  . Transportation needs - non-medical: None  Occupational History  . None  Tobacco Use  . Smoking status: Former Smoker    Last attempt to quit: 09/21/1955    Years since quitting: 61.5  . Smokeless tobacco: Never Used  Substance and Sexual Activity  . Alcohol use: Yes    Alcohol/week: 0.0 oz    Comment: one or two ounces a  week  . Drug use: No  . Sexual activity: None  Other Topics Concern  . None  Social History Narrative   Lives with wife in a one story home.  Has 5 children.  Retired Freight forwarder for [employer].  Education: 2 years of college.     Family History:  The patient's family history includes Heart disease in his father.     ROS:   Please see the history of present illness.    ROS All other systems reviewed and are negative.   PHYSICAL EXAM:   VS:  BP 118/62   Pulse 75   Ht 5\' 9"  (1.753 m)   Wt 181 lb (82.1 kg)   SpO2 96%   BMI 26.73 kg/m    GEN: Well nourished, well developed, in no acute distress  HEENT: normal  Neck: no JVD, carotid bruits, or masses Cardiac: RRR; no murmurs, rubs, or gallops,no edema  Respiratory:  clear to auscultation bilaterally, normal work of breathing GI: soft, nontender, nondistended, + BS MS: no deformity or atrophy  Skin: warm and dry, no rash Neuro:  Alert and Oriented x 3, Strength and sensation are intact Psych: euthymic mood, full affect   Wt Readings from Last 3 Encounters:  03/31/17 181 lb (82.1 kg)  12/23/16 176 lb (79.8 kg)  12/12/16 173 lb (78.5 kg)      Studies/Labs Reviewed:   EKG:  EKG is NOT ordered today.    Recent Labs: 08/01/2016: ALT 23 09/21/2016: Hemoglobin 12.9; Platelets 148; Potassium 3.8; Sodium 141 10/23/2016: BUN 15; Creat 0.86   Lipid Panel    Component Value Date/Time   CHOL 110 01/24/2016 0955   TRIG 133.0 01/24/2016 0955   TRIG 83 03/18/2006 0839   HDL 49.70 01/24/2016 0955   CHOLHDL 2 01/24/2016 0955   VLDL 26.6 01/24/2016 0955   LDLCALC 34 01/24/2016 0955    Additional studies/ records that were reviewed today include:  Echo: 08/16/2014 Study Conclusions - Left ventricle: The cavity size was normal. Wall thickness was increased in a pattern of mild LVH. There was mild focal basal hypertrophy of the septum. Systolic function was vigorous. The estimated ejection fraction was in the range of 65% to 70%.  Wall motion was normal; there were  no regional wall motion abnormalities. Doppler parameters are consistent with abnormal left ventricular relaxation (grade 1 diastolic dysfunction). - Mitral valve: Calcified annulus. Mild prolapse, involving the anterior leaflet and the posterior leaflet. Impressions: - Vigorous LV function; grade 1 diastolic dysfunction; mild bileaflet MVP; trace MR and TR.  Lexiscan stress test: 06/2014 Impression Exercise Capacity: Lexiscan with low level exercise. BP Response: Normal blood pressure response. Clinical Symptoms: No chest pain. ECG Impression: No significant ST segment change suggestive of ischemia. Comparison with Prior Nuclear Study:Previous report was low risk scan  Overall Impression: Probable normal perfusion and minimal apical thnning. No evidence for significant ischemia or scar. Overall low risk scan  LV Ejection Fraction: 66%. LV Wall Motion: NL LV Function; NL Wall Motion   Lesxiscan Myoview 05/07/16 Study Highlights   Nuclear stress EF: 56%.  There was no ST segment deviation noted during stress.  The study is normal.  This is a low risk study.  The left ventricular ejection fraction is normal (55-65%).     48 hour holter monitor 04/2015 Study Highlights  Very frequent monomorphic PVCs - 16.700 in 48 hours. Some couplets.  Infrequent PACs.  Very frequent monomorphic PVCs - 16.700 in 48 hours.   EKG was performed today and personally reviewed it shows normal sinus rhythm with inferior MI age undetermined unchanged from prior.    ASSESSMENT & PLAN:   1. Dyspnea on exertion - progressively worsening, with history of CABG 16 years ago, stress test in 04/2016 with artifacts, The last cath in 5/10 with patent L-LAD, S-RCA, S-OM1/OM2, EF 60%. We'll schedule another left heart catheterization.  2. CAD s/p CABG: as above, continue aspirin, losartan, Crestor.  3. Symptomatic orthostatic hypotension,  autonomic dysfunction, currently on and Northera, followed by Dr Troy Combs in [place], he is advised to contact him for episodes of severe hypertension at nights.  4. Systemic hypertension: He brings diary from home his blood pressure is taking go as high as 852 systolic.   5. PVCs: stable  6. HLD: continue Crestor, no side effects.  Medication Adjustments/Labs and Tests Ordered: Current medicines are reviewed at length with the patient today.  Concerns regarding medicines are outlined above.  Medication changes, Labs and Tests ordered today are listed in the Patient Instructions below. Patient Instructions  Medication Instructions:   TAKE YOUR PREDNISONE 50 MG AS INSTRUCTED, IN PREPARATION FOR YOUR CARDIAC CATH ON 04/03/17.  YOU WILL TAKE PREDNISONE 50 MG BY MOUTH ON 12/5 AT 6:30 PM, THEN TAKE PREDNISONE 50 MG BY MOUTH ON 12/6 AT 12:30 AM, THEN TAKE LAST DOSE OF PREDNISONE 50 MG BY MOUTH ON 12/6 AT 6:30 AM, RIGHT BEFORE YOUR CATH.  TAKE BENADRYL 50 MG BY MOUTH ON 12/6 AT 6:30 AM--TAKE WITH YOUR LAST DOSE OF PREDNISONE, JUST PRIOR TO YOUR CATH.    Labwork:  TODAY---PRE-CATH LABS---CMET, CBC W DIFF, TSH, AND PT/INR    Testing/Procedures:  Your physician has requested that you have a cardiac catheterization. Cardiac catheterization is used to diagnose and/or treat various heart conditions. Doctors may recommend this procedure for a number of different reasons. The most common reason is to evaluate chest pain. Chest pain can be a symptom of coronary artery disease (CAD), and cardiac catheterization can show whether plaque is narrowing or blocking your heart's arteries. This procedure is also used to evaluate the valves, as well as measure the blood flow and oxygen levels in different parts of your heart. For further information please visit [email or web address]. Please follow instruction sheet, as given.  YOUR CATH IS SCHEDULED FOR THIS Thursday 04/03/17 AT 5:30 AM AT [facility] TO DO.  PLEASE FOLLOW INSTRUCTIONS PROVIDED, TO HELP PREPARE YOU FOR THIS PROCEDURE.     Follow-Up:  3 MONTHS WITH DR Troy Combs       If you need a refill on your cardiac medications before your next appointment, please call your pharmacy.      Signed, Ena Dawley, MD  03/31/2017 12:47 PM    [address], [place], [place]  [address] Phone: [phone]; Fax: [phone]

## 2017-03-31 NOTE — Progress Notes (Signed)
Cardiology Office Note    Date:  03/31/2017   ID:  Troy Combs, DOB 18-Aug-1931, MRN [id number]  PCP:  Antony Contras, MD  Cardiologist:  Dr. [name] [name]  CC: orthostatic hypotension, DOE  History of Present Illness:  Troy Combs is a 83 y.o. male with a history of CAD s/p CABG x4V (2002), HTN, atrial flutter, known orthostatic hypotension who presents to clinic for evaluation of orthostatic hypotension.  In the past his Toprol XL was increased to 100 mg PO daily for symptomatic PVCs, but later decreased to 50 mg po daily for fatigue and dizziness. He was later switched to Coreg with improvement in symptoms (fatigue). He was sen by Dr. [name] in 2016. He complained of worsening DOE. Exercise Myoview stress test was low risk but did show chronotropic incompetence and it was switched to lexiscan. 2D ECHO at that time showed vigorous LV function (EF 65-70%), G1DD and mild bileaflet MVP. 48 hour holter monitor in 04/2015 showed very infrequent PVCS and PACs.  Seen back by Dr. [name] in 04/2016 and having worsening DOE. Another stress test was ordered which was low risk.  He saw [name] on 09/17/16 who felt orthostatic hypotension was likely related to longstanding systolic HTN. Given his supine HTN he was started on losartan 50mg  at night and stop Florinef. He also recommended an abdominal binder and stopping his alpha blocker (BPH).   He was then admitted 5/25-5/27/18 for orthostatic hypotension. His flomax and proscar were discontinued and Losartan decreased from 50mg  daily to 25mg  daily. Later he had to resume flomax and proscar due to unacceptable symptoms. He has been placed on midodrine since that time which has been increased to 10mg  q 4 hours. Dr. [name] recommended [name] but it looks like this may be cost prohibitive. She also recommended that he elevate his bed 10-20 degrees.   12/23/2016 - the patient now follows with specially for autonomic dysfunction in [facility]  [name], he communicates with the patient through Internet on a weekly basis and adjust his medicines. He is currently on Northera 200 mg PO TID, Midodrine  5 mg po Q4H, and pyridostigmine 60 mg po TID. he has been experiencing significant nausea for the last 6 weeks, he is currently debilitated he's not leaving the house because of that. He denies any falls. He hasn't had any dyspnea on exertion and mild chest pain at rest last night. He feels nauseous today in the clinic his resting blood pressure was 138/86 and heart rate 90 bpm. He was given 8 mg of ondansetron with relief in his symptoms.  03/31/2017 - the patient states that his dyspnea on exertion has been progressively worsening and now even when minimally exerted with activities around the house. He denies any exertional chest pain but had resting chest pain at night in his right shoulder couple weeks ago. He denies any palpitations or syncope. He continues to see a autonomic dysfunction specialist at [facility] for orthostatic hypotension and was started on Northera and advised to eat more salt including placing the teaspoon of salt into the drink and drink it daily to get her with increased amount of fluids. The patient states that these approach has been working for him for a while but lately his blood pressure increases especially toward the end of the day and he has had multiple measurements in 190s.   Past Medical History:  Diagnosis Date  . Allergic rhinitis 09/20/2014  . Anemia, unspecified   . Arthritis   .  Bladder neck obstruction   . Cataracts, bilateral   . Coronary atherosclerosis of unspecified type of vessel, native or graft   . Diminished hearing, bilateral   . Dysthymic disorder   . GERD (gastroesophageal reflux disease)   . Heart murmur    as a child  . Impaired glucose tolerance 12/02/2012  . Impotence of organic origin   . Lumbar radiculopathy   . Malignant melanoma of skin of upper limb, including shoulder ([place])   . Old  myocardial infarction   . Personal history of malignant melanoma of skin   . Personal history of other diseases of digestive system   . Thoracic or lumbosacral neuritis or radiculitis, unspecified   . Unspecified essential hypertension     Past Surgical History:  Procedure Laterality Date  . ARTHRODESIS  07/06/2002   of left long finger distal interphalangeal joint with Kirschner wire fixation X 3  . ATRIAL ABLATION SURGERY  05/04/2001   Dr. [name]  . CARDIAC CATHETERIZATION  09/05/2008   Revealing 4 of 4 patent grafts with native multivessel coronary artery disease, EF  of 60% without regional wall motion abnormalities.  Marland Kitchen CATARACT EXTRACTION, BILATERAL    . CHOLECYSTECTOMY    . COLONOSCOPY    . CORONARY ANGIOPLASTY  1993  . CORONARY ARTERY BYPASS GRAFT  10/17/2000   [name]. [name], [place]     AFTER LIVER TRAUMA AND HAND SURGERY  . EYE SURGERY    . HERNIA REPAIR     BILATERAL  . LUMBAR LAMINECTOMY/DECOMPRESSION MICRODISCECTOMY N/A 02/02/2016   Procedure: MICRODISCECTOMY LUMBAR FOUR- LUMBAR FIVE;  Surgeon: Ashok Pall, MD;  Location: [facility];  Service: Neurosurgery;  Laterality: N/A;  MICRODISCECTOMY L4-L5  . LUMBAR LAMINECTOMY/DECOMPRESSION MICRODISCECTOMY Right 08/02/2016   Procedure: MICRODISCECTOMY LUMBAR FOUR- LUMBAR FIVE RIGHT;  Surgeon: Ashok Pall, MD;  Location: [facility];  Service: Neurosurgery;  Laterality: Right;  . punctured eardrum      to relieve blood build up   for ear infection  . TONSILLECTOMY      Current Medications: Outpatient Medications Prior to Visit  Medication Sig Dispense Refill  . albuterol (PROVENTIL HFA;VENTOLIN HFA) 108 (90 BASE) MCG/ACT inhaler Inhale 2 puffs into the lungs every 6 (six) hours as needed for wheezing or shortness of breath. 1 Inhaler 11  . aspirin EC 81 MG tablet Take 81 mg by mouth at bedtime.     . Droxidopa 300 MG CAPS Take 300 mg by mouth 3 (three) times daily with meals.    Marland Kitchen losartan (COZAAR) 50  MG tablet Take 1 tablet (50 mg total) by mouth daily. 90 tablet 3  . nitroGLYCERIN (NITROSTAT) 0.4 MG SL tablet Place 1 tablet (0.4 mg total) under the tongue every 5 (five) minutes as needed for chest pain (x 3 doses). Reported on 06/15/2015 25 tablet 6  . omeprazole (PRILOSEC) 20 MG capsule Take 20 mg by mouth daily as needed (acid reflux).     . rosuvastatin (CRESTOR) 10 MG tablet TAKE 1 TABLET DAILY 90 tablet 0  . sertraline (ZOLOFT) 50 MG tablet TAKE 1 TABLET DAILY 90 tablet 3  . tamsulosin (FLOMAX) 0.4 MG CAPS capsule Take 1 capsule (0.4 mg total) by mouth daily after supper. 90 capsule 3  . Droxidopa (NORTHERA) 100 MG CAPS Take 100 mg by mouth 3 (three) times daily.    . midodrine (PROAMATINE) 5 MG tablet Take 2 tablets 10 mg by mouth every 4 hours while awake    . pyridostigmine (  MESTINON) 60 MG tablet Take 1/2 tablet three times a day for 1 week, then increase to 1 tablet three times a day (Patient not taking: Reported on 03/31/2017) 90 tablet 6  . sulfamethoxazole-trimethoprim (BACTRIM DS,SEPTRA DS) 800-160 MG tablet Take 1 tablet by mouth 2 (two) times daily. (Patient not taking: Reported on 03/31/2017) 14 tablet 0   No facility-administered medications prior to visit.      Allergies:   Iohexol; Ciprofloxacin; and Other   Social History   Socioeconomic History  . Marital status: Married    Spouse name: None  . Number of children: 5  . Years of education: None  . Highest education level: None  Social Needs  . Financial resource strain: None  . Food insecurity - worry: None  . Food insecurity - inability: None  . Transportation needs - medical: None  . Transportation needs - non-medical: None  Occupational History  . None  Tobacco Use  . Smoking status: Former Smoker    Last attempt to quit: 09/21/1955    Years since quitting: 61.5  . Smokeless tobacco: Never Used  Substance and Sexual Activity  . Alcohol use: Yes    Alcohol/week: 0.0 oz    Comment: one or two ounces a  week  . Drug use: No  . Sexual activity: None  Other Topics Concern  . None  Social History Narrative   Lives with wife in a one story home.  Has 5 children.  Retired Freight forwarder for [employer].  Education: 2 years of college.     Family History:  The patient's family history includes Heart disease in his father.     ROS:   Please see the history of present illness.    ROS All other systems reviewed and are negative.   PHYSICAL EXAM:   VS:  BP 118/62   Pulse 75   Ht 5\' 9"  (1.753 m)   Wt 181 lb (82.1 kg)   SpO2 96%   BMI 26.73 kg/m    GEN: Well nourished, well developed, in no acute distress  HEENT: normal  Neck: no JVD, carotid bruits, or masses Cardiac: RRR; no murmurs, rubs, or gallops,no edema  Respiratory:  clear to auscultation bilaterally, normal work of breathing GI: soft, nontender, nondistended, + BS MS: no deformity or atrophy  Skin: warm and dry, no rash Neuro:  Alert and Oriented x 3, Strength and sensation are intact Psych: euthymic mood, full affect   Wt Readings from Last 3 Encounters:  03/31/17 181 lb (82.1 kg)  12/23/16 176 lb (79.8 kg)  12/12/16 173 lb (78.5 kg)      Studies/Labs Reviewed:   EKG:  EKG is NOT ordered today.    Recent Labs: 08/01/2016: ALT 23 09/21/2016: Hemoglobin 12.9; Platelets 148; Potassium 3.8; Sodium 141 10/23/2016: BUN 15; Creat 0.86   Lipid Panel    Component Value Date/Time   CHOL 110 01/24/2016 0955   TRIG 133.0 01/24/2016 0955   TRIG 83 03/18/2006 0839   HDL 49.70 01/24/2016 0955   CHOLHDL 2 01/24/2016 0955   VLDL 26.6 01/24/2016 0955   LDLCALC 34 01/24/2016 0955    Additional studies/ records that were reviewed today include:  Echo: 08/16/2014 Study Conclusions - Left ventricle: The cavity size was normal. Wall thickness was increased in a pattern of mild LVH. There was mild focal basal hypertrophy of the septum. Systolic function was vigorous. The estimated ejection fraction was in the range of 65% to 70%.  Wall motion was normal; there were  no regional wall motion abnormalities. Doppler parameters are consistent with abnormal left ventricular relaxation (grade 1 diastolic dysfunction). - Mitral valve: Calcified annulus. Mild prolapse, involving the anterior leaflet and the posterior leaflet. Impressions: - Vigorous LV function; grade 1 diastolic dysfunction; mild bileaflet MVP; trace MR and TR.  Lexiscan stress test: 06/2014 Impression Exercise Capacity: Lexiscan with low level exercise. BP Response: Normal blood pressure response. Clinical Symptoms: No chest pain. ECG Impression: No significant ST segment change suggestive of ischemia. Comparison with Prior Nuclear Study:Previous report was low risk scan  Overall Impression: Probable normal perfusion and minimal apical thnning. No evidence for significant ischemia or scar. Overall low risk scan  LV Ejection Fraction: 66%. LV Wall Motion: NL LV Function; NL Wall Motion   Lesxiscan Myoview 05/07/16 Study Highlights   Nuclear stress EF: 56%.  There was no ST segment deviation noted during stress.  The study is normal.  This is a low risk study.  The left ventricular ejection fraction is normal (55-65%).     48 hour holter monitor 04/2015 Study Highlights  Very frequent monomorphic PVCs - 16.700 in 48 hours. Some couplets.  Infrequent PACs.  Very frequent monomorphic PVCs - 16.700 in 48 hours.   EKG was performed today and personally reviewed it shows normal sinus rhythm with inferior MI age undetermined unchanged from prior.    ASSESSMENT & PLAN:   1. Dyspnea on exertion - progressively worsening, with history of CABG 16 years ago, stress test in 04/2016 with artifacts, The last cath in 5/10 with patent L-LAD, S-RCA, S-OM1/OM2, EF 60%. We'll schedule another left heart catheterization.  2. CAD s/p CABG: as above, continue aspirin, losartan, Crestor.  3. Symptomatic orthostatic hypotension,  autonomic dysfunction, currently on and Northera, followed by Dr Margrett Rud in [place], he is advised to contact him for episodes of severe hypertension at nights.  4. Systemic hypertension: He brings diary from home his blood pressure is taking go as high as 884 systolic.   5. PVCs: stable  6. HLD: continue Crestor, no side effects.  Medication Adjustments/Labs and Tests Ordered: Current medicines are reviewed at length with the patient today.  Concerns regarding medicines are outlined above.  Medication changes, Labs and Tests ordered today are listed in the Patient Instructions below. Patient Instructions  Medication Instructions:   TAKE YOUR PREDNISONE 50 MG AS INSTRUCTED, IN PREPARATION FOR YOUR CARDIAC CATH ON 04/03/17.  YOU WILL TAKE PREDNISONE 50 MG BY MOUTH ON 12/5 AT 6:30 PM, THEN TAKE PREDNISONE 50 MG BY MOUTH ON 12/6 AT 12:30 AM, THEN TAKE LAST DOSE OF PREDNISONE 50 MG BY MOUTH ON 12/6 AT 6:30 AM, RIGHT BEFORE YOUR CATH.  TAKE BENADRYL 50 MG BY MOUTH ON 12/6 AT 6:30 AM--TAKE WITH YOUR LAST DOSE OF PREDNISONE, JUST PRIOR TO YOUR CATH.    Labwork:  TODAY---PRE-CATH LABS---CMET, CBC W DIFF, TSH, AND PT/INR    Testing/Procedures:  Your physician has requested that you have a cardiac catheterization. Cardiac catheterization is used to diagnose and/or treat various heart conditions. Doctors may recommend this procedure for a number of different reasons. The most common reason is to evaluate chest pain. Chest pain can be a symptom of coronary artery disease (CAD), and cardiac catheterization can show whether plaque is narrowing or blocking your heart's arteries. This procedure is also used to evaluate the valves, as well as measure the blood flow and oxygen levels in different parts of your heart. For further information please visit [facility]. Please follow instruction sheet, as given.  YOUR CATH IS SCHEDULED FOR THIS Thursday 04/03/17 AT 5:30 AM AT [facility] TO [facility].  PLEASE FOLLOW INSTRUCTIONS PROVIDED, TO HELP PREPARE YOU FOR THIS PROCEDURE.     Follow-Up:  3 MONTHS WITH DR [name]       If you need a refill on your cardiac medications before your next appointment, please call your pharmacy.      Signed, Ena Dawley, MD  03/31/2017 12:47 PM    [address], [place], [place]  [address] Phone: [phone]; Fax: [phone]

## 2017-04-01 ENCOUNTER — Telehealth: Payer: Self-pay

## 2017-04-01 LAB — CBC WITH DIFFERENTIAL/PLATELET
Basophils Absolute: 0 10*3/uL (ref 0.0–0.2)
Basos: 0 %
EOS (ABSOLUTE): 0.1 10*3/uL (ref 0.0–0.4)
Eos: 1 %
Hematocrit: 43.2 % (ref 37.5–51.0)
Hemoglobin: 14.7 g/dL (ref 13.0–17.7)
Immature Grans (Abs): 0 10*3/uL (ref 0.0–0.1)
Immature Granulocytes: 0 %
Lymphocytes Absolute: 1.6 10*3/uL (ref 0.7–3.1)
Lymphs: 16 %
MCH: 29.8 pg (ref 26.6–33.0)
MCHC: 34 g/dL (ref 31.5–35.7)
MCV: 88 fL (ref 79–97)
Monocytes Absolute: 0.8 10*3/uL (ref 0.1–0.9)
Monocytes: 8 %
Neutrophils Absolute: 7.6 10*3/uL — ABNORMAL HIGH (ref 1.4–7.0)
Neutrophils: 75 %
Platelets: 192 10*3/uL (ref 150–379)
RBC: 4.93 x10E6/uL (ref 4.14–5.80)
RDW: 14.3 % (ref 12.3–15.4)
WBC: 10.3 10*3/uL (ref 3.4–10.8)

## 2017-04-01 LAB — COMPREHENSIVE METABOLIC PANEL
ALT: 20 IU/L (ref 0–44)
AST: 17 IU/L (ref 0–40)
Albumin/Globulin Ratio: 2.4 — ABNORMAL HIGH (ref 1.2–2.2)
Albumin: 4.5 g/dL (ref 3.5–4.7)
Alkaline Phosphatase: 56 IU/L (ref 39–117)
BUN/Creatinine Ratio: 16 (ref 10–24)
BUN: 13 mg/dL (ref 8–27)
Bilirubin Total: 0.7 mg/dL (ref 0.0–1.2)
CO2: 22 mmol/L (ref 20–29)
Calcium: 9.3 mg/dL (ref 8.6–10.2)
Chloride: 106 mmol/L (ref 96–106)
Creatinine, Ser: 0.82 mg/dL (ref 0.76–1.27)
GFR calc Af Amer: 95 mL/min/{1.73_m2} (ref >59)
GFR calc non Af Amer: 82 mL/min/{1.73_m2} (ref >59)
Globulin, Total: 1.9 g/dL (ref 1.5–4.5)
Glucose: 110 mg/dL — ABNORMAL HIGH (ref 65–99)
Potassium: 4.9 mmol/L (ref 3.5–5.2)
Sodium: 145 mmol/L — ABNORMAL HIGH (ref 134–144)
Total Protein: 6.4 g/dL (ref 6.0–8.5)

## 2017-04-01 LAB — PROTIME-INR
INR: 1 (ref 0.8–1.2)
Prothrombin Time: 10.6 s (ref 9.1–12.0)

## 2017-04-01 LAB — TSH: TSH: 1.79 u[IU]/mL (ref 0.450–4.500)

## 2017-04-01 NOTE — Telephone Encounter (Signed)
Left detailed message per DPR on wife mobile.    Patient contacted pre-catheterization at [facility] scheduled for:  04/03/2017 @ 0730 Verified arrival time and place:  [facility] @ 0530 Confirmed AM meds to be taken pre-cath with sip of water: Take ASA, Prednisone and Benadryl @ 530  Pre prednisone is 12/5 @ 5:30  Pm and 12/5 @ 11:30 pm  Addl concerns:  Left this nurse name and # for call back if any questions

## 2017-04-03 ENCOUNTER — Ambulatory Visit (HOSPITAL_COMMUNITY)
Admission: RE | Admit: 2017-04-03 | Discharge: 2017-04-03 | Disposition: A | Discharge status: Home / Self Care | Payer: Medicare Other | Source: Ambulatory Visit | Attending: Cardiology | Admitting: Cardiology

## 2017-04-03 ENCOUNTER — Encounter (HOSPITAL_COMMUNITY)
Admission: RE | Disposition: A | Discharge status: Home / Self Care | Payer: Self-pay | Source: Ambulatory Visit | Attending: Cardiology

## 2017-04-03 ENCOUNTER — Encounter (HOSPITAL_COMMUNITY): Payer: Self-pay | Admitting: Cardiology

## 2017-04-03 DIAGNOSIS — M1990 Unspecified osteoarthritis, unspecified site: Secondary | ICD-10-CM | POA: Diagnosis not present

## 2017-04-03 DIAGNOSIS — Z951 Presence of aortocoronary bypass graft: Secondary | ICD-10-CM | POA: Diagnosis not present

## 2017-04-03 DIAGNOSIS — R0600 Dyspnea, unspecified: Secondary | ICD-10-CM | POA: Diagnosis present

## 2017-04-03 DIAGNOSIS — Z7982 Long term (current) use of aspirin: Secondary | ICD-10-CM | POA: Insufficient documentation

## 2017-04-03 DIAGNOSIS — I2510 Atherosclerotic heart disease of native coronary artery without angina pectoris: Secondary | ICD-10-CM | POA: Diagnosis present

## 2017-04-03 DIAGNOSIS — Z79899 Other long term (current) drug therapy: Secondary | ICD-10-CM | POA: Diagnosis not present

## 2017-04-03 DIAGNOSIS — Z85820 Personal history of malignant melanoma of skin: Secondary | ICD-10-CM | POA: Diagnosis not present

## 2017-04-03 DIAGNOSIS — G909 Disorder of the autonomic nervous system, unspecified: Secondary | ICD-10-CM | POA: Diagnosis not present

## 2017-04-03 DIAGNOSIS — I493 Ventricular premature depolarization: Secondary | ICD-10-CM | POA: Insufficient documentation

## 2017-04-03 DIAGNOSIS — Z87891 Personal history of nicotine dependence: Secondary | ICD-10-CM | POA: Diagnosis not present

## 2017-04-03 DIAGNOSIS — G903 Multi-system degeneration of the autonomic nervous system: Secondary | ICD-10-CM | POA: Diagnosis present

## 2017-04-03 DIAGNOSIS — E785 Hyperlipidemia, unspecified: Secondary | ICD-10-CM | POA: Diagnosis not present

## 2017-04-03 DIAGNOSIS — I10 Essential (primary) hypertension: Secondary | ICD-10-CM | POA: Diagnosis not present

## 2017-04-03 DIAGNOSIS — I252 Old myocardial infarction: Secondary | ICD-10-CM | POA: Insufficient documentation

## 2017-04-03 DIAGNOSIS — I951 Orthostatic hypotension: Secondary | ICD-10-CM | POA: Insufficient documentation

## 2017-04-03 DIAGNOSIS — R0609 Other forms of dyspnea: Secondary | ICD-10-CM | POA: Diagnosis present

## 2017-04-03 DIAGNOSIS — R0602 Shortness of breath: Secondary | ICD-10-CM | POA: Diagnosis present

## 2017-04-03 HISTORY — PX: LEFT HEART CATH AND CORS/GRAFTS ANGIOGRAPHY: CATH118250

## 2017-04-03 SURGERY — LEFT HEART CATH AND CORS/GRAFTS ANGIOGRAPHY
Anesthesia: LOCAL

## 2017-04-03 MED ORDER — LIDOCAINE HCL (PF) 1 % IJ SOLN
INTRAMUSCULAR | Status: AC
  Filled 2017-04-03: qty 30

## 2017-04-03 MED ORDER — ASPIRIN 81 MG PO CHEW
81.0000 mg | CHEWABLE_TABLET | ORAL | Status: DC
Start: 2017-04-03 — End: 2017-04-03

## 2017-04-03 MED ORDER — SODIUM CHLORIDE 0.9% FLUSH: 3.0000 mL | INTRAVENOUS | Status: DC | PRN

## 2017-04-03 MED ORDER — HEPARIN SODIUM (PORCINE) 1000 UNIT/ML IJ SOLN
INTRAMUSCULAR | Status: DC | PRN
  Administered 2017-04-03: 4500 [IU] via INTRAVENOUS

## 2017-04-03 MED ORDER — ONDANSETRON HCL 4 MG/2ML IJ SOLN: 4.0000 mg | Freq: Four times a day (QID) | INTRAMUSCULAR | Status: DC | PRN

## 2017-04-03 MED ORDER — HEPARIN (PORCINE) IN NACL 2-0.9 UNIT/ML-% IJ SOLN
INTRAMUSCULAR | Status: AC
  Filled 2017-04-03: qty 1000

## 2017-04-03 MED ORDER — MIDAZOLAM HCL 2 MG/2ML IJ SOLN
INTRAMUSCULAR | Status: AC
  Filled 2017-04-03: qty 2

## 2017-04-03 MED ORDER — MIDAZOLAM HCL 2 MG/2ML IJ SOLN
INTRAMUSCULAR | Status: DC | PRN
  Administered 2017-04-03: 2 mg via INTRAVENOUS

## 2017-04-03 MED ORDER — HEPARIN SODIUM (PORCINE) 1000 UNIT/ML IJ SOLN
INTRAMUSCULAR | Status: AC
  Filled 2017-04-03: qty 1

## 2017-04-03 MED ORDER — SODIUM CHLORIDE 0.9 % IV SOLN: 250.0000 mL | INTRAVENOUS | Status: DC | PRN

## 2017-04-03 MED ORDER — HEPARIN (PORCINE) IN NACL 2-0.9 UNIT/ML-% IJ SOLN
INTRAMUSCULAR | Status: DC | PRN
  Administered 2017-04-03: 10 mL via INTRA_ARTERIAL

## 2017-04-03 MED ORDER — SODIUM CHLORIDE 0.9 % WEIGHT BASED INFUSION
3.0000 mL/kg/h | INTRAVENOUS | Status: AC
  Administered 2017-04-03: 3 mL/kg/h via INTRAVENOUS

## 2017-04-03 MED ORDER — IOPAMIDOL (ISOVUE-370) INJECTION 76%
INTRAVENOUS | Status: AC
  Filled 2017-04-03: qty 125

## 2017-04-03 MED ORDER — SODIUM CHLORIDE 0.9 % WEIGHT BASED INFUSION: 1.0000 mL/kg/h | INTRAVENOUS | Status: DC

## 2017-04-03 MED ORDER — ACETAMINOPHEN 325 MG PO TABS: 650.0000 mg | ORAL_TABLET | ORAL | Status: DC | PRN

## 2017-04-03 MED ORDER — VERAPAMIL HCL 2.5 MG/ML IV SOLN
INTRAVENOUS | Status: AC
  Filled 2017-04-03: qty 2

## 2017-04-03 MED ORDER — HEPARIN (PORCINE) IN NACL 2-0.9 UNIT/ML-% IJ SOLN
INTRAMUSCULAR | Status: AC | PRN
  Administered 2017-04-03: 1000 mL

## 2017-04-03 MED ORDER — SODIUM CHLORIDE 0.9% FLUSH: 3.0000 mL | Freq: Two times a day (BID) | INTRAVENOUS | Status: DC

## 2017-04-03 MED ORDER — LIDOCAINE HCL (PF) 1 % IJ SOLN
INTRAMUSCULAR | Status: DC | PRN
  Administered 2017-04-03: 2 mL

## 2017-04-03 MED ORDER — IOPAMIDOL (ISOVUE-370) INJECTION 76%
INTRAVENOUS | Status: DC | PRN
  Administered 2017-04-03: 120 mL via INTRA_ARTERIAL

## 2017-04-03 MED ORDER — SODIUM CHLORIDE 0.9 % IV SOLN: INTRAVENOUS | Status: DC

## 2017-04-03 MED ORDER — FENTANYL CITRATE (PF) 100 MCG/2ML IJ SOLN
INTRAMUSCULAR | Status: AC
  Filled 2017-04-03: qty 2

## 2017-04-03 MED ORDER — FENTANYL CITRATE (PF) 100 MCG/2ML IJ SOLN
INTRAMUSCULAR | Status: DC | PRN
  Administered 2017-04-03: 25 ug via INTRAVENOUS

## 2017-04-03 SURGICAL SUPPLY — 10 items
CATH INFINITI 5FR ANG PIGTAIL (CATHETERS) ×2 IMPLANT
CATH INFINITI 5FR MULTPACK ANG (CATHETERS) ×2 IMPLANT
DEVICE RAD COMP TR BAND LRG (VASCULAR PRODUCTS) ×2 IMPLANT
GLIDESHEATH SLEND A-KIT 6F 22G (SHEATH) ×2 IMPLANT
GUIDEWIRE INQWIRE 1.5J.035X260 (WIRE) ×1 IMPLANT
INQWIRE 1.5J .035X260CM (WIRE) ×2
KIT HEART LEFT (KITS) ×2 IMPLANT
PACK CARDIAC CATHETERIZATION (CUSTOM PROCEDURE TRAY) ×2 IMPLANT
TRANSDUCER W/STOPCOCK (MISCELLANEOUS) ×2 IMPLANT
TUBING CIL FLEX 10 FLL-RA (TUBING) ×2 IMPLANT

## 2017-04-03 NOTE — Discharge Instructions (Signed)

## 2017-04-03 NOTE — Interval H&P Note (Signed)
History and Physical Interval Note:  04/03/2017 7:36 AM  Troy Combs  has presented today for surgery, with the diagnosis of cad - post cabg with progressively worsening exertional dyspnea (?as angina equivalent).  The various methods of treatment have been discussed with the patient and family. After consideration of risks, benefits and other options for treatment, the patient has consented to  Procedure(s): LEFT HEART CATH AND CORS/GRAFTS ANGIOGRAPHY (N/A) with possible PERCUTANEOUS CORONARY INTERVENTION as a surgical intervention .  The patient's history has been reviewed, patient examined, no change in status, stable for surgery.  I have reviewed the patient's chart and labs.  Questions were answered to the patient's satisfaction.    Cath Lab Visit (complete for each Cath Lab visit)  Clinical Evaluation Leading to the Procedure:   ACS: No.  Non-ACS:    Anginal Classification: No "Angina" - DOE as anginal equivalent - Class III  Anti-ischemic medical therapy: Minimal Therapy (1 class of medications)  Non-Invasive Test Results: No non-invasive testing performed  Prior CABG: Previous CABG   Glenetta Hew

## 2017-05-19 ENCOUNTER — Telehealth: Payer: Self-pay | Admitting: Neurology

## 2017-05-19 NOTE — Telephone Encounter (Signed)
Pt's wife called and said they were out of town and pt fell and hit his face, went to the ER and they did cat scans and see nothing wrong but he has had alot of headaches now and wants to be seen, will be back in town by the 25th

## 2017-05-19 NOTE — Telephone Encounter (Signed)
Spoke with pt's wife, [name].  She states that they are currently in [place], [place] for the funeral of a family member.  Their first day there (05/06/17) pt tripped over the curb and came down on his face.  He was taken to the ER and had 3 CT's (head, cervical and thoracic spine) all came back with no evidence of concussion, however pt has experienced a continuous headache since then. Wife requested that pt be seen.  Appointment made for Jun 04, 2017 @ 3:30PM.

## 2017-06-04 ENCOUNTER — Encounter: Payer: Self-pay | Admitting: Neurology

## 2017-06-04 ENCOUNTER — Ambulatory Visit (INDEPENDENT_AMBULATORY_CARE_PROVIDER_SITE_OTHER): Payer: Medicare Other | Admitting: Neurology

## 2017-06-04 ENCOUNTER — Other Ambulatory Visit: Payer: Self-pay

## 2017-06-04 VITALS — BP 148/74 | HR 80 | Ht 69.0 in | Wt 181.0 lb

## 2017-06-04 DIAGNOSIS — I951 Orthostatic hypotension: Secondary | ICD-10-CM

## 2017-06-04 DIAGNOSIS — G44309 Post-traumatic headache, unspecified, not intractable: Secondary | ICD-10-CM

## 2017-06-04 MED ORDER — GABAPENTIN 100 MG PO CAPS: ORAL_CAPSULE | ORAL | 5 refills | Status: DC

## 2017-06-04 NOTE — Patient Instructions (Signed)
1. Start gabapentin 100mg : take 1 capsule at night for a week, then increase to 2 caps at night 2. Continue follow-up with Cardiology 3. Follow-up in 3-4 months, call for any changes

## 2017-06-04 NOTE — Progress Notes (Signed)
NEUROLOGY FOLLOW UP OFFICE NOTE  GORDON VANDUNK [id number]  DOB: 1932/04/29  HISTORY OF PRESENT ILLNESS: I had the pleasure of seeing Alandis Bluemel in follow-up in the neurology clinic on 06/04/2017. He is alone in the office today. He was last seen 7 months ago for orthostatic hypotension that worsened after lumbar surgery in April 2018. He continues to track his BP and HR, it continues to be quite labile. He has seen Cardiologist [name] in [place] with a diagnosis of neurogenic orthostatic hypotension, he alternates between Florinef and hydralazine depending on BP readings. He presents today for follow-up after a fall last 05/07/17 while in [place]. He sustained an abrasion on the left frontal region. Since the fall, he has had more constant headaches that wax and wane, worse in the afternoon, 3 to 4 over 10 at it's lowest. Today it is a 5 or 6 over 10. The headaches never get so bad that he feels debilitated. Pain is over the back of his head, reducing some with Tylenol that he takes twice a week on average. No nausea/vomiting, vision changes, photo/phonophobia. He is still dizzy with positional changes. He has noticed trouble remembering names. He brings the head CT done in [place] which I personally reviewed, there was note of enlarged ventricles, which I compared to prior MRI brain done 10/2016, there is no significant difference. He does not have typical symptoms seen with normal pressure hydrocephalus, no urinary incontinence or magnetic gait.   HPI 02/2015: This is a pleasant 84 yo RH man who presented for vertigo and headaches. He had been previously seen by one of my partners, Dr. [name] in 2014 for cervicogenic headaches. He had vertigo that started in April/May 2016. He would have a sensation of tilting when he sat on the edge of the bed, with violent vomiting. He was prescribed meclizine, which would help sometimes. There was concern this was due to his beta-blocker, and after  adjustment, he reported the intense dizziness went away, but he still had dizziness if moving too quickly, turning his head, or standing up. He was in the ER in September 2016 due to significant vertigo with dull headache. He had an MRI brain without contrast which I personally reviewed, no acute changes, there was chronic microvascular disease and generalized atrophy. He saw his PCP and was referred for vestibular rehab. He reports that vestibular rehab worked almost immediately. The dizziness has since resolved. He has a little lightheadedness and feels off balance and unsteady, no falls. He has occasional tingling and numbness in the last 3 digits of both feet, and occasionally pins and needles sensation in the soles of his feet. He has had monocular vertical diplopia for many years, and noticed that this was less when he takes nortriptyline. He had been having occipital headaches, but reported those are pretty much gone, with 1 to 2 over 10 pain, no associated nausea, vomiting, photo/phonophobia. He has some neck crepitus, "like gravel when turning my head," and occasional low back pain. He has some constipation. No family history of similar symptoms.  He presented 10/23/16 for different symptoms that he and his wife report started after back surgery in April 2018. On review of notes, he had a durotomy while retracting the thecal sac. He was patched and kept on bedrest, and there was no evidence of leak through the skin. He was discharged to rehab, and while in rehab he was noted to have orthostatic hypotension, with significant drop with exertion and associated dizziness.  He was seen a few days later by Cardiology, and there is note that he has been known to have orthostatic hypotension and complained of exertional dizziness in the past. He agreed this was the case, but since surgery, symptoms have been much worse. He could not get to the door without feeling like he would pass out. He was noted to have a drop  in SBP from 143 to 90 in the office. Coreg was discontinued. He was started on Florinef 0.1mg  daily. He saw cardiologist [name] last month, with note of orthostatic hypotension antedating back surgery, mechanism unclear but likely related to long-standing systolic hypertension. His supine pressures were over 170, and Losartan was started. Abdominal binder was advised. He continued to have worsening of dizziness with any activity, and was admitted to [facility] on 09/20/16, with note of 50-point drop from supine to standing. He became hypertensive in the ER. Florinef and BPH (Finasteride and Flomax) medications were discontinued and Losartan dose reduced, with note of improvement in orthostatic hypotension. Since hospital discharge, he would have very labile BP changes, 161/93 supine, 79/54 standing. They called [name] who recommended increasing Losartan back to 50mg , and restarting Flomax. He was using the abdominal binder. He was advised to start ProAmantine 5mg  every 4 hours while awake, then increased to 10mg  q4hrs. He reported wearing an abdominal binder, using compression stocking, taking midodrone, and stopping finasteride, but symptoms continue to worsen. He was referred to Neurology due to symptoms worsening after Neurosurgery with CSF leak.   He reports that he does not think he was getting this significant dizziness before the surgery. He described the dizziness as "disorientation more than anything." He has not passed out. He has been having headaches but they are not positional in nature. They were more severe in rehab, still occurring every other day, improving with 2 Equates (aspirin, acetaminophen, caffeine). He feels pressure sometimes in the back of his head or diffusely, no associated nausea/vomiting, photo/phonophobia, it does not seem to improve when supine. He has chronic tinnitus that is unchanged. He has a history of peripheral neuropathy and denies any numbness or tingling in his extremities,  but has new symptoms of muscle spasms in his arms, legs and back for the past 2 weeks. He has chronic vertical diplopia that is better with nortriptyline. He denies any dysphagia, dysarthria, bowel/bladder dysfunction, no back pain. They bring a calendar of his BP readings, this morning he went from 138/88 sitting (HR 82) to 80/52 standing (HR 97).    PAST MEDICAL HISTORY: Past Medical History:  Diagnosis Date  . Allergic rhinitis 09/20/2014  . Anemia, unspecified   . Arthritis   . Bladder neck obstruction   . Cataracts, bilateral   . Coronary atherosclerosis of unspecified type of vessel, native or graft   . Diminished hearing, bilateral   . Dysthymic disorder   . GERD (gastroesophageal reflux disease)   . Heart murmur    as a child  . Impaired glucose tolerance 12/02/2012  . Impotence of organic origin   . Lumbar radiculopathy   . Malignant melanoma of skin of upper limb, including shoulder ([place])   . Old myocardial infarction   . Personal history of malignant melanoma of skin   . Personal history of other diseases of digestive system   . Thoracic or lumbosacral neuritis or radiculitis, unspecified   . Unspecified essential hypertension     MEDICATIONS: Current Outpatient Medications on File Prior to Visit  Medication Sig Dispense Refill  .  acetaminophen (TYLENOL) 500 MG tablet Take 1,000-1,500 mg by mouth daily as needed for moderate pain or headache.    Marland Kitchen aspirin EC 81 MG tablet Take 81 mg by mouth at bedtime.     . diphenhydrAMINE (BENADRYL) 25 mg capsule Take 2 capsules (50 mg total) by mouth on 12/6 at 6:30 am, with last dose of prednisone. 2 capsule 0  . Droxidopa 300 MG CAPS Take 300 mg by mouth 3 (three) times daily.     . finasteride (PROSCAR) 5 MG tablet Take 5 mg by mouth at bedtime.    Marland Kitchen losartan (COZAAR) 50 MG tablet Take 1 tablet (50 mg total) by mouth daily. (Patient taking differently: Take 50 mg by mouth at bedtime. ) 90 tablet 3  . nitroGLYCERIN (NITROSTAT) 0.4 MG  SL tablet Place 1 tablet (0.4 mg total) under the tongue every 5 (five) minutes as needed for chest pain (x 3 doses). Reported on 06/15/2015 25 tablet 6  . predniSONE (DELTASONE) 50 MG tablet Take 1 tab by mouth on 12/5 at 6:30 pm, then take 1 tab by mouth on 12/6 at 12:30 am, then take 1 tab by mouth on 12/6 at 6:30 am, right before cath. 3 tablet 0  . sertraline (ZOLOFT) 50 MG tablet TAKE 1 TABLET DAILY (Patient taking differently: TAKE 1 TABLET DAILY AT BEDTIME) 90 tablet 3  . tamsulosin (FLOMAX) 0.4 MG CAPS capsule Take 1 capsule (0.4 mg total) by mouth daily after supper. (Patient taking differently: Take 0.4 mg by mouth at bedtime. ) 90 capsule 3   No current facility-administered medications on file prior to visit.     ALLERGIES: Allergies  Allergen Reactions  . Iohexol Other (See Comments)     Code: HIVES, Desc: PATENT STATES HE IS ALLERGIC TO IV DYE 09/14/08/RM, Onset Date: [id number]   . Ciprofloxacin Hives and Rash    "Patient unsure" just know he has a reaction to med Medical record indicates allergic reaction  . Other     UNSPECIFIED REACTION  Angioplasty dye pt.>> Pt only recalls Had "very bad reaction"    FAMILY HISTORY: Family History  Problem Relation Age of Onset  . Heart disease Father   . Ataxia Neg Hx   . Chorea Neg Hx   . Dementia Neg Hx   . Mental retardation Neg Hx   . Migraines Neg Hx   . Multiple sclerosis Neg Hx   . Neurofibromatosis Neg Hx   . Neuropathy Neg Hx   . Parkinsonism Neg Hx   . Seizures Neg Hx   . Stroke Neg Hx     SOCIAL HISTORY: Social History   Socioeconomic History  . Marital status: Married    Spouse name: Not on file  . Number of children: 5  . Years of education: Not on file  . Highest education level: Not on file  Social Needs  . Financial resource strain: Not on file  . Food insecurity - worry: Not on file  . Food insecurity - inability: Not on file  . Transportation needs - medical: Not on file  . Transportation needs -  non-medical: Not on file  Occupational History  . Not on file  Tobacco Use  . Smoking status: Former Smoker    Last attempt to quit: 09/21/1955    Years since quitting: 61.7  . Smokeless tobacco: Never Used  Substance and Sexual Activity  . Alcohol use: Yes    Alcohol/week: 0.0 oz    Comment: one or two ounces a  week  . Drug use: No  . Sexual activity: Not on file  Other Topics Concern  . Not on file  Social History Narrative   Lives with wife in a one story home.  Has 5 children.  Retired Freight forwarder for [employer].  Education: 2 years of college.     REVIEW OF SYSTEMS: Constitutional: No fevers, chills, or sweats, no generalized fatigue, change in appetite Eyes: No visual changes, double vision, eye pain Ear, nose and throat: No hearing loss, ear pain, nasal congestion, sore throat Cardiovascular: No chest pain, palpitations Respiratory:  No shortness of breath at rest or with exertion, wheezes GastrointestinaI: No nausea, vomiting, diarrhea, abdominal pain, fecal incontinence Genitourinary:  No dysuria, urinary retention or frequency Musculoskeletal:  + neck pain, back pain Integumentary: No rash, pruritus, skin lesions Neurological: as above Psychiatric: No depression, insomnia, anxiety Endocrine: No palpitations, fatigue, diaphoresis, mood swings, change in appetite, change in weight, increased thirst Hematologic/Lymphatic:  No anemia, purpura, petechiae. Allergic/Immunologic: no itchy/runny eyes, nasal congestion, recent allergic reactions, rashes  PHYSICAL EXAM: Vitals:   06/04/17 1530  BP: (!) 148/74  Pulse: 80  SpO2: 94%    No data found.  General: No acute distress Head:  Normocephalic/atraumatic Neck: supple, no paraspinal tenderness, full range of motion Heart:  Regular rate and rhythm Lungs:  Clear to auscultation bilaterally Back: No paraspinal tenderness Skin/Extremities: No rash, no edema Neurological Exam: alert and oriented to person, place, and time. No  aphasia or dysarthria. Fund of knowledge is appropriate.  Recent and remote memory are intact.  Attention and concentration are normal.    Able to name objects and repeat phrases. Cranial nerves: Pupils equal, round, reactive to light. Fundoscopic exam shows sharp discs bilaterally. Extraocular movements intact with no nystagmus. Visual fields full. Facial sensation intact. No facial asymmetry. Tongue, uvula, palate midline.  Motor: Bulk and tone normal, no cogwheeling, muscle strength 5/5 throughout with no pronator drift.  Sensation intact to all modalities on both UE, decreased sensation on both LE. Deep tendon reflexes +1 on both UE and patella, +2 ankles. Upgoing toe on right. Finger to nose testing intact.  Gait slow and cautious, decreased arm swing bilaterally, no ataxia, he gets dizzy on standing. Romberg positive sway. No resting tremor, he has a mild high frequency low amplitude action > postural tremor (similar to prior).  IMPRESSION: This is a pleasant 84 yo RH man with a history of hypertension, CAD s/p MI and CABG, neurogenic orthostatic hypotension, cervicogenic headaches, presenting with worsening headaches after a fall last month. Head CT no acute changes. Symptoms suggestive of post-concussive headaches. Typically amitriptyline is used for these type of headaches, however this may exacerbate orthostatic hypotension and would avoid. He will start low dose gabapentin 100mg  qhs for a week, then increase to 200mg  qhs. Side effects were discussed. Continue follow-up with [name] and Dr. [name]. He was advised to start using the abdominal binder. He will follow-up in 3-4 months and knows to call for any changes.   Thank you for allowing me to participate in his care.  Please do not hesitate to call for any questions or concerns.    Ellouise Newer, M.D.   CC: Dr. Moreen Fowler

## 2017-06-05 ENCOUNTER — Encounter: Payer: Self-pay | Admitting: Cardiology

## 2017-06-08 ENCOUNTER — Other Ambulatory Visit: Payer: Self-pay | Admitting: Internal Medicine

## 2017-06-11 ENCOUNTER — Encounter: Payer: Self-pay | Admitting: Neurology

## 2017-07-03 ENCOUNTER — Encounter: Payer: Self-pay | Admitting: Neurology

## 2017-07-04 ENCOUNTER — Other Ambulatory Visit: Payer: Self-pay | Admitting: Internal Medicine

## 2017-07-04 ENCOUNTER — Encounter: Payer: Self-pay | Admitting: Neurology

## 2017-07-06 IMAGING — CT CT HEAD W/O CM
1 series · 16 of 30 positions shown, 20 images · non-contrast
Comparison: MRI of the brain 10/21/2008.

CLINICAL DATA: Left-sided headache and dizziness. Symptoms
intermittently for 3 weeks.

EXAM:
CT HEAD WITHOUT CONTRAST
TECHNIQUE: Contiguous axial images were obtained from the base of the skull
through the vertex without intravenous contrast.

[Series 2: head 5.0 h30s · axial · 0.44mm/px · z∈[-88,+52]mm · 16 of 32 slices shown, 20 images]
[im 2/32  brain]
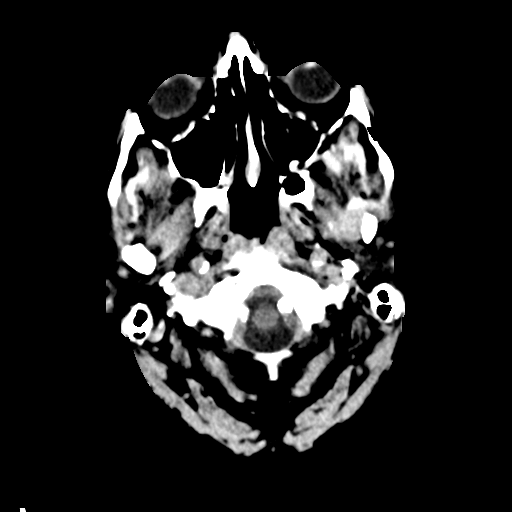
[im 2/32  bone]
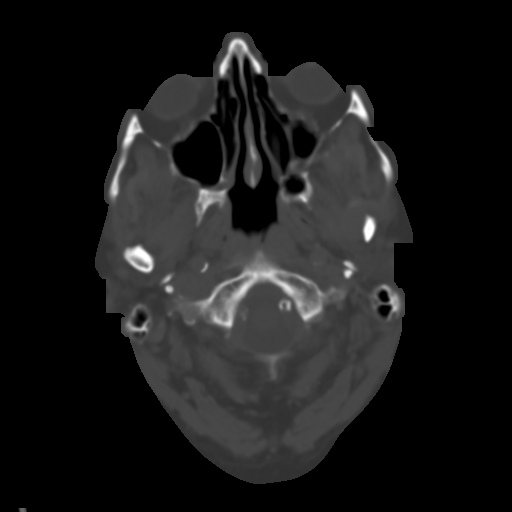
[im 4/32  brain]
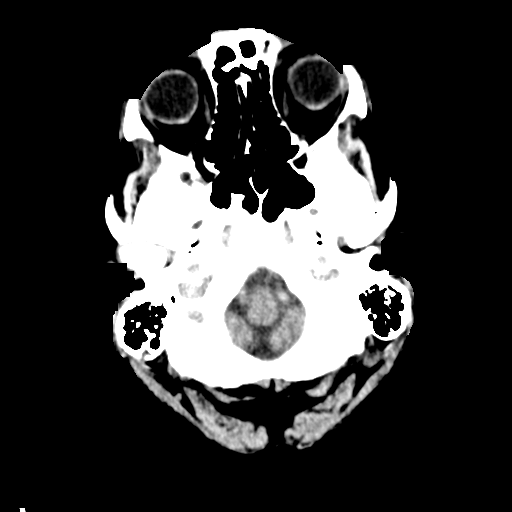
[im 6/32  brain]
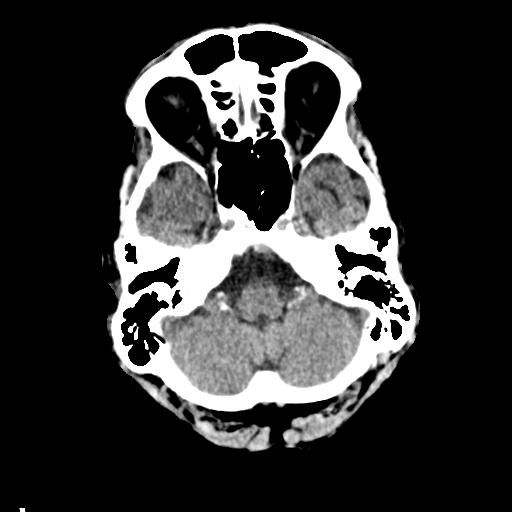
[im 8/32  brain]
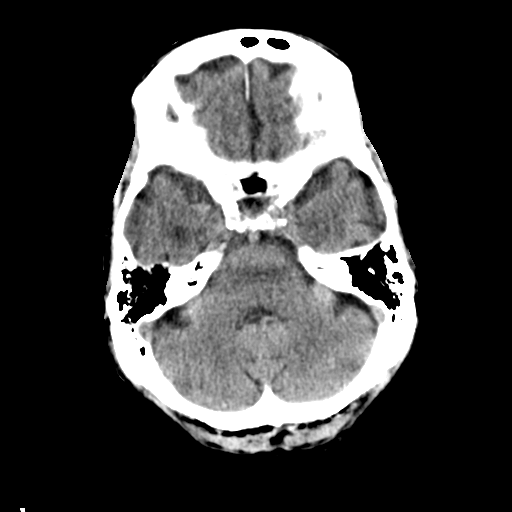
[im 9/32  brain]
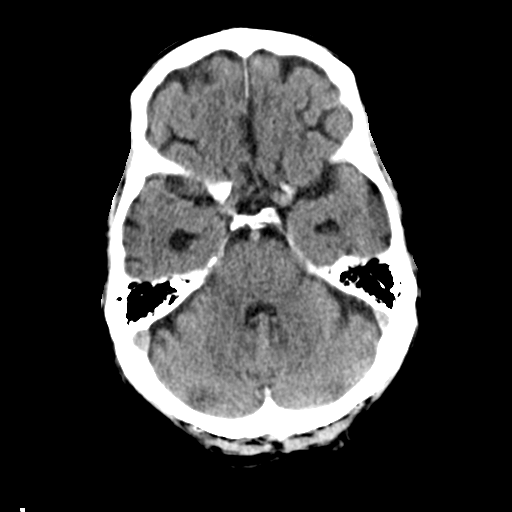
[im 9/32  bone]
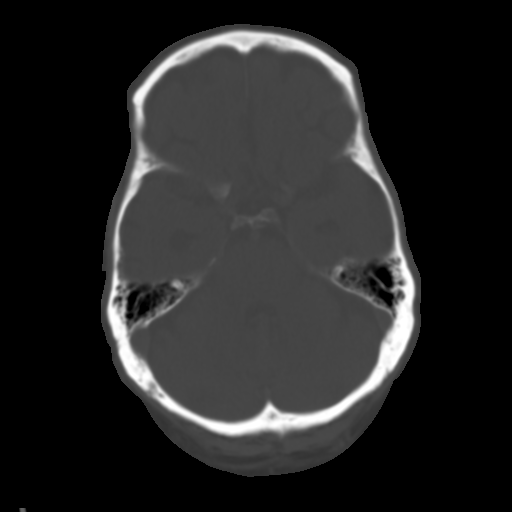
[im 11/32  brain]
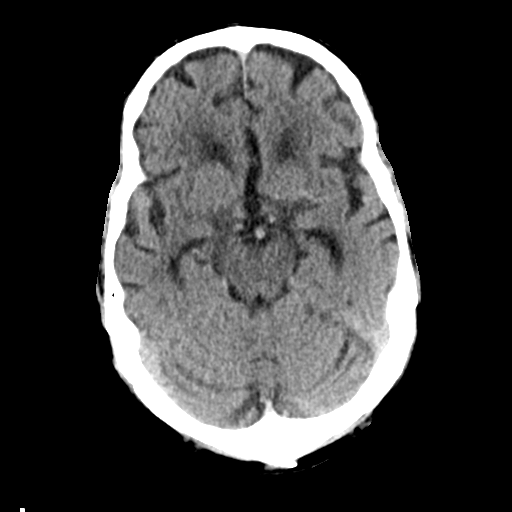
[im 13/32  brain]
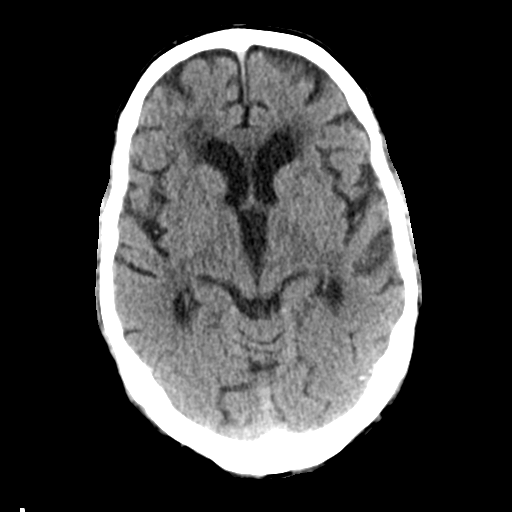
[im 15/32  brain]
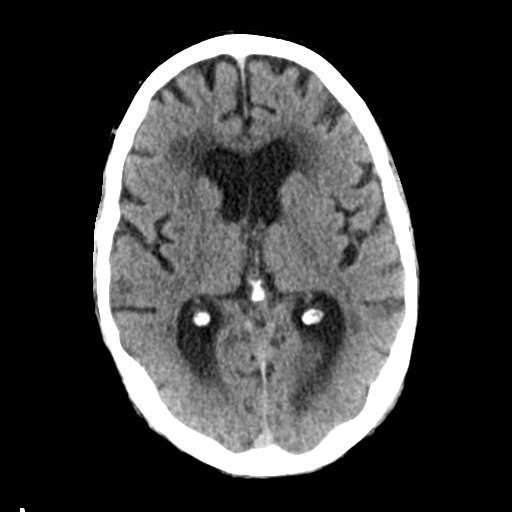
[im 17/32  brain]
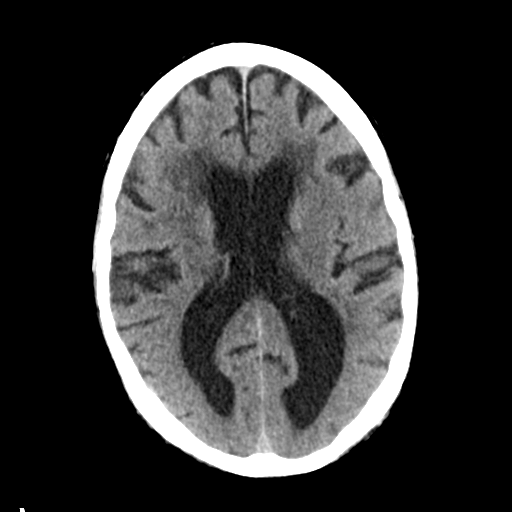
[im 17/32  bone]
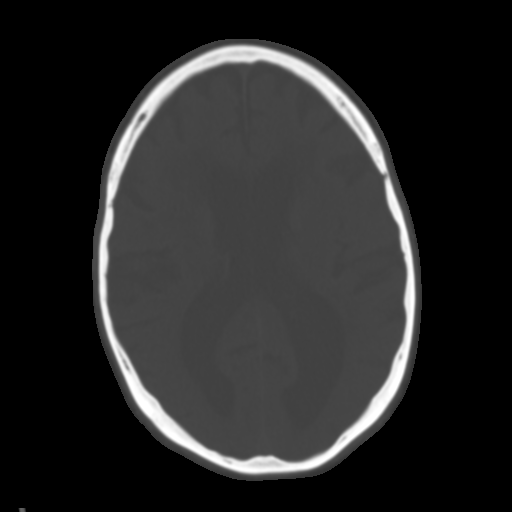
[im 19/32  brain]
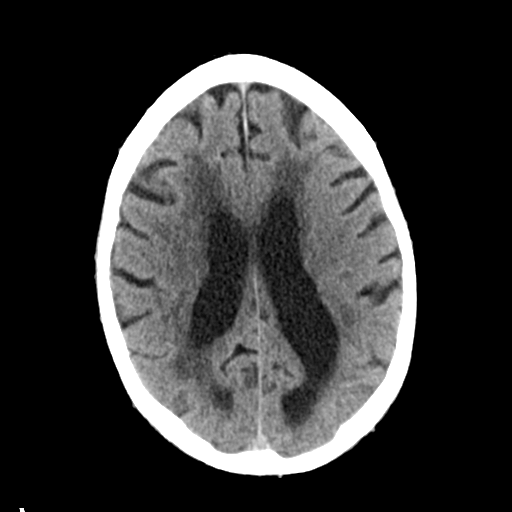
[im 21/32  brain]
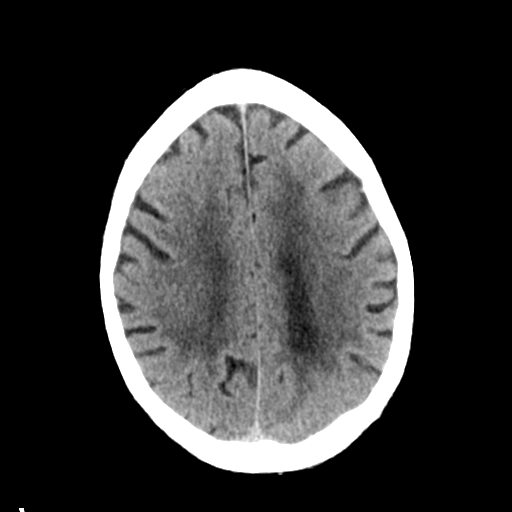
[im 23/32  brain]
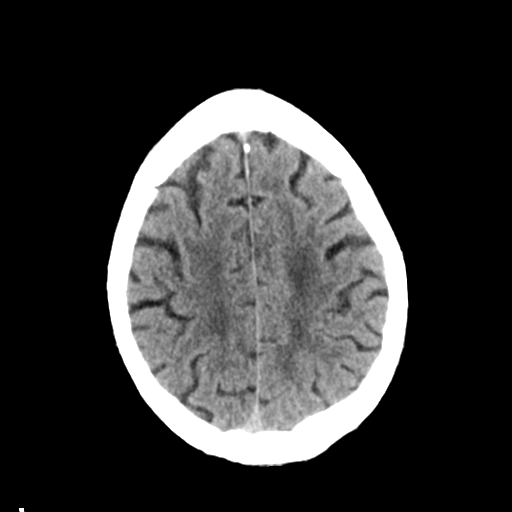
[im 24/32  brain]
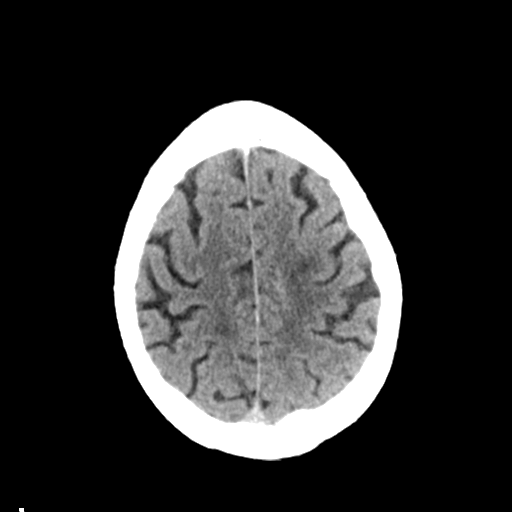
[im 24/32  bone]
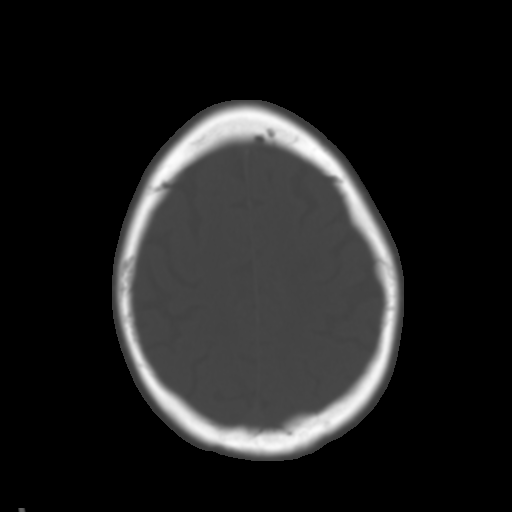
[im 26/32  brain]
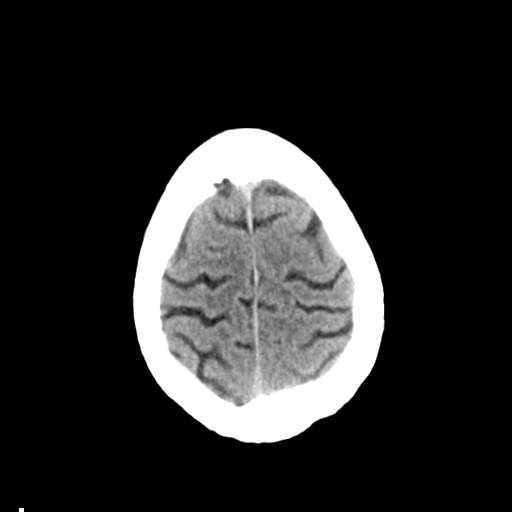
[im 28/32  brain]
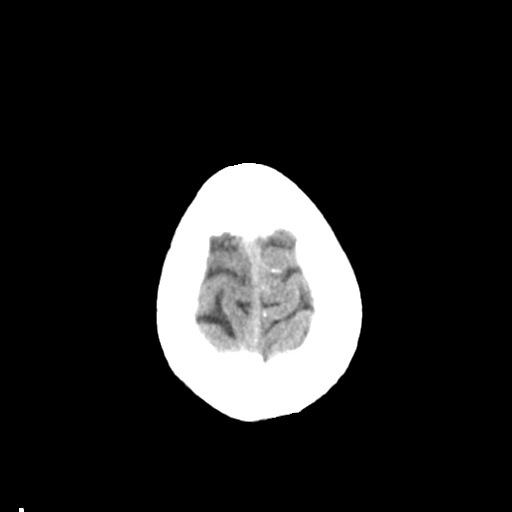
[im 30/32  brain]
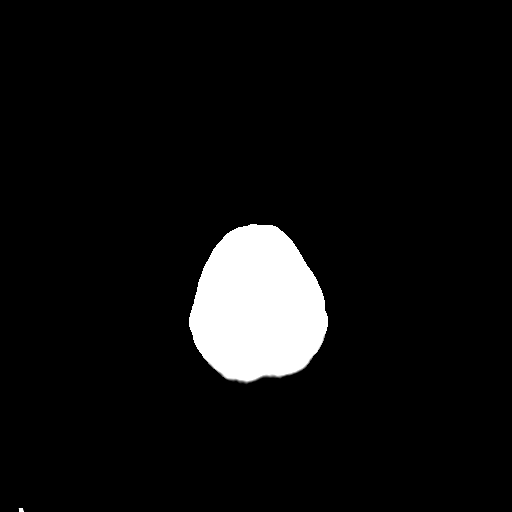

[16 of 30 positions shown; findings below may reference images not displayed]

FINDINGS: No mass lesion, mass effect, midline shift, hydrocephalus,
hemorrhage. No acute territorial cortical ischemia/infarct. Atrophy
and chronic ischemic white matter disease is present. Calvarium
intact. Intracranial atherosclerosis.
IMPRESSION: Atrophy and chronic ischemic white matter disease without acute
intracranial abnormality.

## 2017-07-09 ENCOUNTER — Ambulatory Visit: Payer: Medicare Other | Admitting: Cardiology

## 2017-07-14 IMAGING — MR MR HEAD W/O CM
10 of 11 series · 35 of 48 positions shown · non-contrast
Comparison: Head CT 01/10/2015.  MRI 10/21/2008

CLINICAL DATA: Continue us left posterior headache and vertigo,
several months duration.

EXAM:
MRI HEAD WITHOUT CONTRAST
TECHNIQUE: Multiplanar, multiecho pulse sequences of the brain and surrounding
structures were obtained without intravenous contrast.

[Series 3: T1 · sagittal · 5.0mm · 0.47mm/px · 2 of 24 slices shown]
[im 1/24]
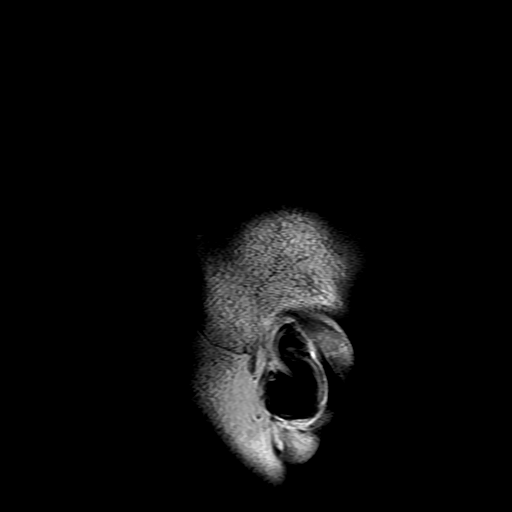
[im 24/24]
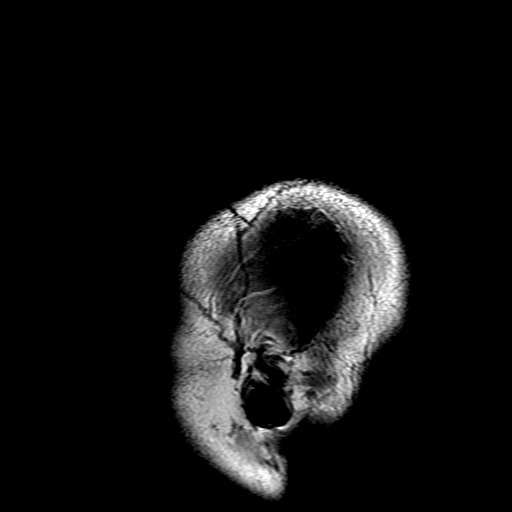

[Series 4: DWI · axial · 3.0mm · 1.09mm/px · z∈[-36,+120]mm · 8 of 106 slices shown (1 of 4)]
[im 1/106]
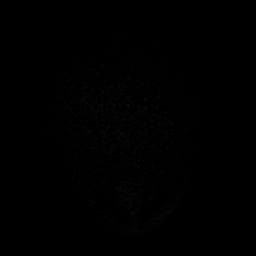
[im 12/106]
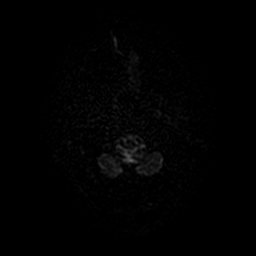
[im 36/106]
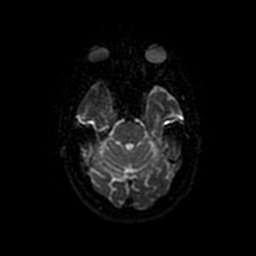
[im 47/106]
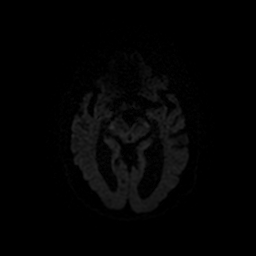
[im 59/106]
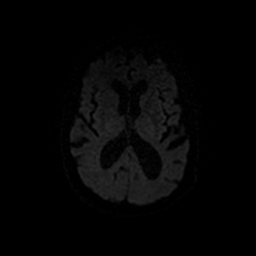
[im 71/106]
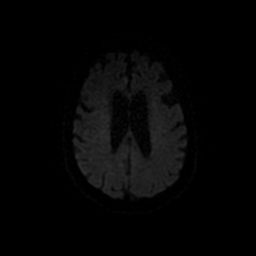
[im 94/106]
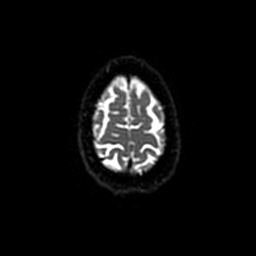
[im 106/106]
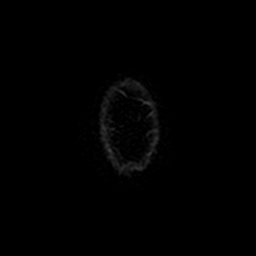

[Series 5: DWI · coronal · 5.0mm · 1.09mm/px · 7 of 76 slices shown (2 of 4)]
[im 1/76]
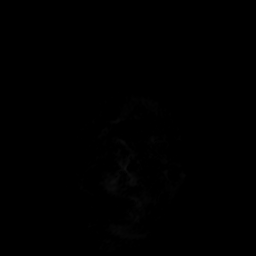
[im 13/76]
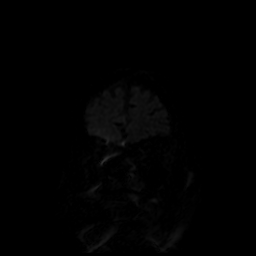
[im 26/76]
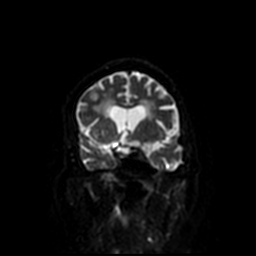
[im 38/76]
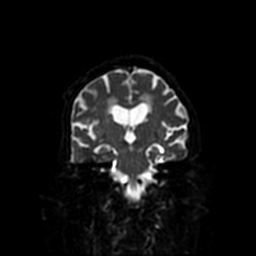
[im 51/76]
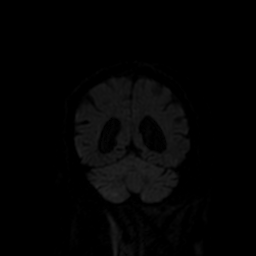
[im 63/76]
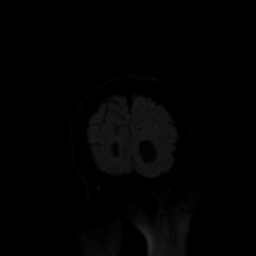
[im 76/76]
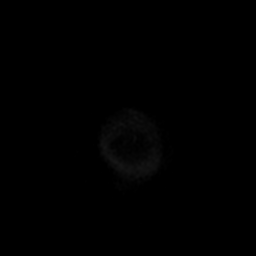

[Series 6: T2 · axial · 5.0mm · 0.43mm/px · z∈[-41,+115]mm · 2 of 25 slices shown (1 of 2)]
[im 1/25]
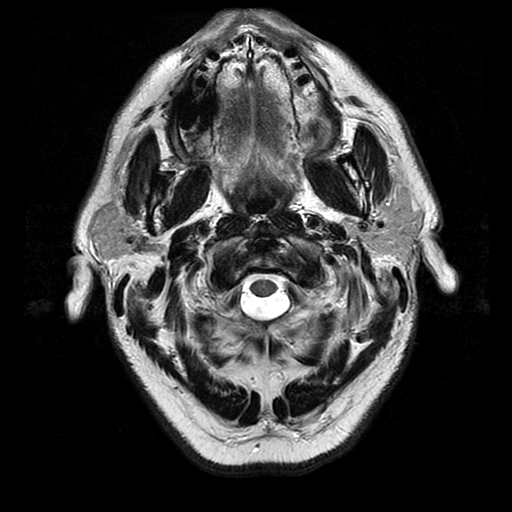
[im 25/25]
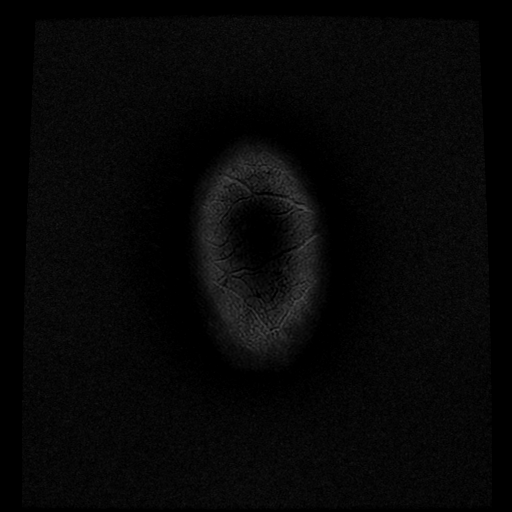

[Series 7: FLAIR · axial · 5.0mm · 0.43mm/px · z∈[-47,+121]mm · 2 of 25 slices shown]
[im 1/25]
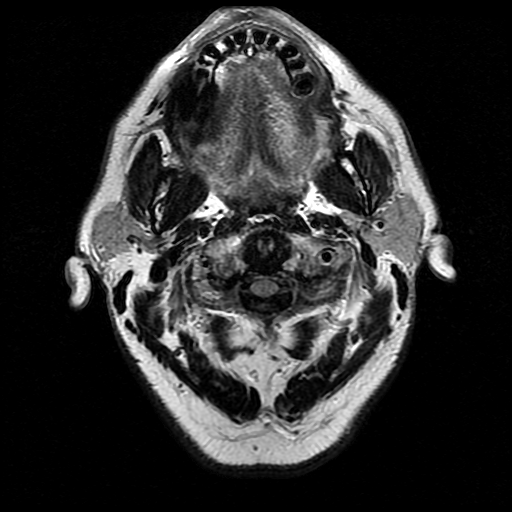
[im 25/25]
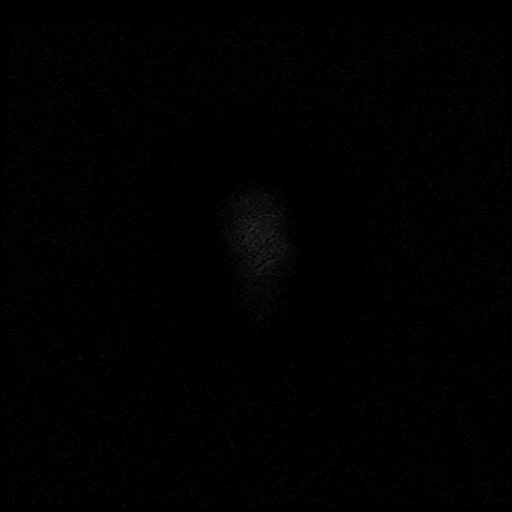

[Series 8: ax mpgr · axial · 5.0mm · 0.43mm/px · z∈[-47,+121]mm · 2 of 25 slices shown]
[im 1/25]
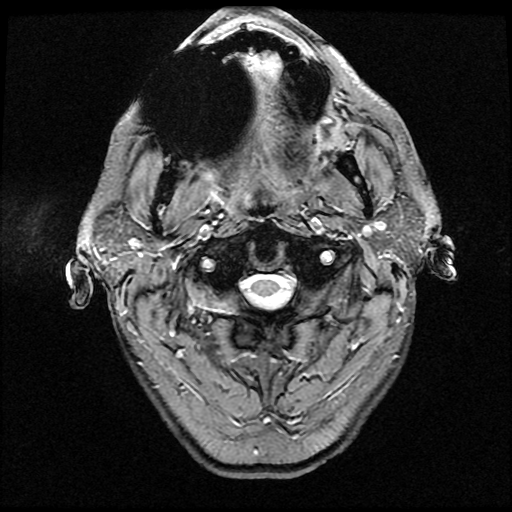
[im 25/25]
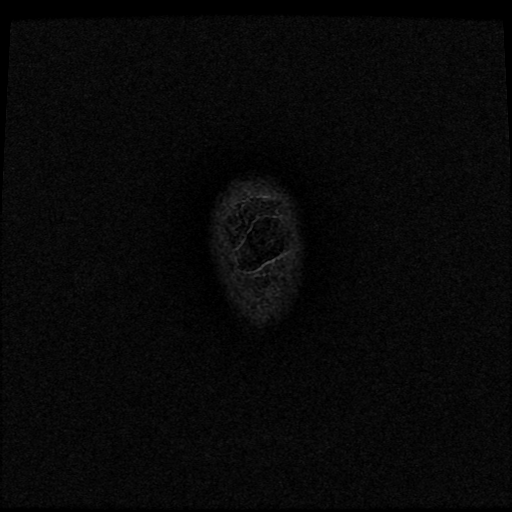

[Series 9: ax fspgr irp · axial · 3.0mm · 0.47mm/px · 1 of 56 slices shown]
[im 1/56]
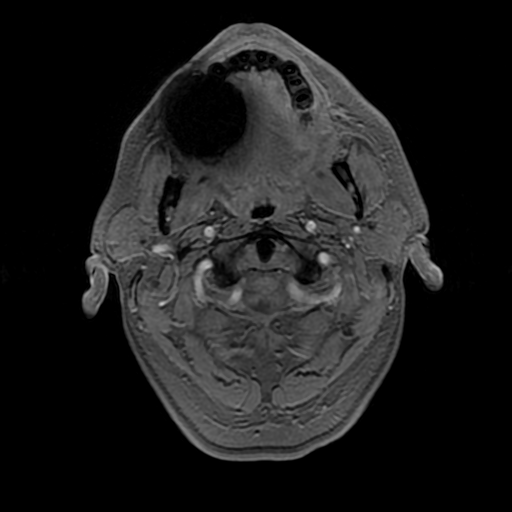

[Series 10: T2 · coronal · 5.0mm · 0.45mm/px · 3 of 30 slices shown (2 of 2)]
[im 1/30]
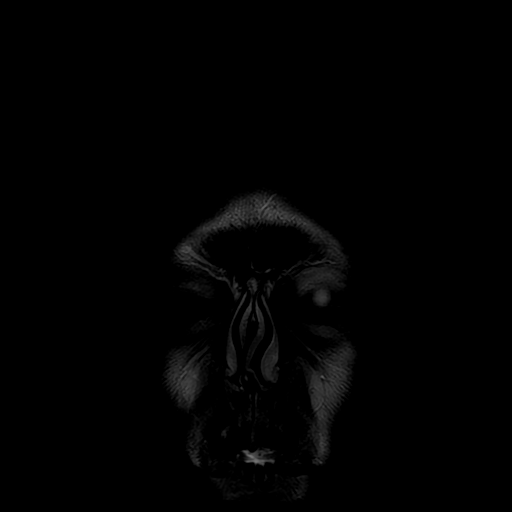
[im 15/30]
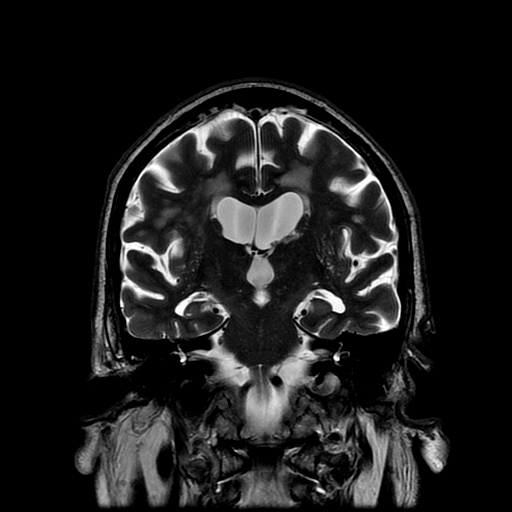
[im 30/30]
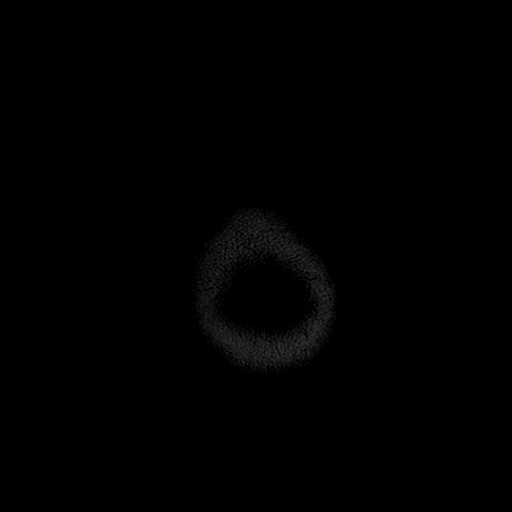

[Series 401: DWI · axial · 3.0mm · 1.09mm/px · z∈[-36,+120]mm · 5 of 53 slices shown (3 of 4)]
[im 1/53]
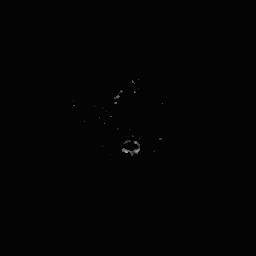
[im 14/53]
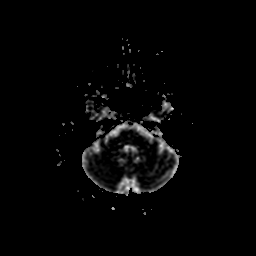
[im 27/53]
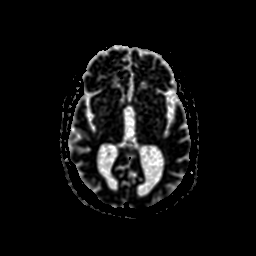
[im 40/53]
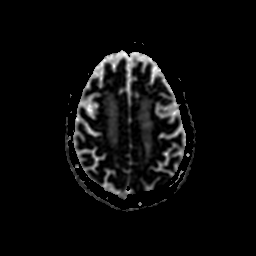
[im 53/53]
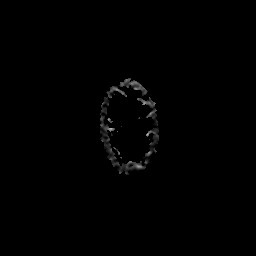

[Series 500: DWI · coronal · 5.0mm · 1.09mm/px · 3 of 38 slices shown (4 of 4)]
[im 1/38]
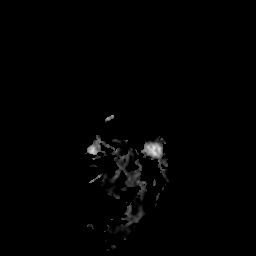
[im 19/38]
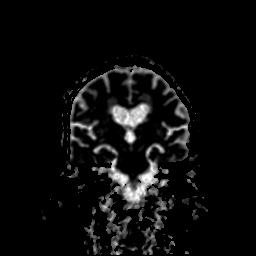
[im 38/38]
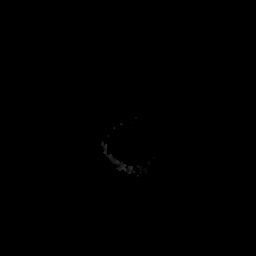

[35 of 48 positions shown; findings below may reference images not displayed]

FINDINGS: Diffusion imaging does not show any acute or subacute infarction.
There is T2 shine through in the left parietal deep white matter
adjacent to the posterior body of the left lateral ventricle. This
does not appear to show true restricted diffusion on the ADC map.

There are chronic small-vessel ischemic changes affecting the pons.
No focal cerebellar infarction. The cerebral hemispheres show
generalized atrophy with patchy and confluent chronic small vessel
ischemic changes throughout the cerebral hemispheric white matter.
There are old small vessel insults affecting the thalami and basal
ganglia. No cortical or large vessel territory infarction. No mass
lesion, hemorrhage, hydrocephalus or extra-axial collection. No
pituitary mass. No fluid in the sinuses, middle ears or mastoids. CP
angle regions appear normal.
IMPRESSION: No acute or reversible finding. Extensive chronic small vessel
ischemic changes throughout the brain as described above. These are
somewhat progressive since the study of 4313.

## 2017-07-17 ENCOUNTER — Encounter: Payer: Self-pay | Admitting: Cardiology

## 2017-07-17 ENCOUNTER — Ambulatory Visit (INDEPENDENT_AMBULATORY_CARE_PROVIDER_SITE_OTHER): Payer: Medicare Other | Admitting: Cardiology

## 2017-07-17 VITALS — BP 142/74 | HR 71 | Ht 69.0 in | Wt 180.0 lb

## 2017-07-17 DIAGNOSIS — I10 Essential (primary) hypertension: Secondary | ICD-10-CM | POA: Diagnosis not present

## 2017-07-17 DIAGNOSIS — I2510 Atherosclerotic heart disease of native coronary artery without angina pectoris: Secondary | ICD-10-CM

## 2017-07-17 DIAGNOSIS — R0609 Other forms of dyspnea: Secondary | ICD-10-CM | POA: Diagnosis not present

## 2017-07-17 DIAGNOSIS — E782 Mixed hyperlipidemia: Secondary | ICD-10-CM

## 2017-07-17 DIAGNOSIS — R0600 Dyspnea, unspecified: Secondary | ICD-10-CM

## 2017-07-17 MED ORDER — METOPROLOL SUCCINATE ER 25 MG PO TB24: 25.0000 mg | ORAL_TABLET | Freq: Every day | ORAL | 2 refills | Status: DC

## 2017-07-17 MED ORDER — HYDRALAZINE HCL 25 MG PO TABS: ORAL_TABLET | ORAL | 1 refills | Status: DC

## 2017-07-17 MED ORDER — LISINOPRIL 5 MG PO TABS: 5.0000 mg | ORAL_TABLET | Freq: Every day | ORAL | 3 refills | Status: DC

## 2017-07-17 NOTE — Patient Instructions (Signed)
Medication Instructions:   STOP TAKING LOSARTAN  START TAKING HYDRALAZINE 25 MG BY MOUTH TWICE DAILY AS NEEDED FOR SYSTOLIC BP GREATER THAN 552.  START TAKING METOPROLOL SUCCINATE (TOPROL XL) 25 MG ONCE DAILY  START TAKING LISINOPRIL 5 MG ONCE DAILY     Follow-Up:  MEGAN SUPPLE PHARMACIST IN 2 WEEKS   Your physician wants you to follow-up in: [place] will receive a reminder letter in the mail two months in advance. If you don't receive a letter, please call our office to schedule the follow-up appointment.        If you need a refill on your cardiac medications before your next appointment, please call your pharmacy.

## 2017-07-17 NOTE — Progress Notes (Signed)
Cardiology Office Note    Date:  07/17/2017   ID:  Troy Combs, DOB 1931/09/01, MRN [id number]  PCP:  Antony Contras, MD  Cardiologist:  Dr. [name] [name]  CC: orthostatic hypotension, DOE  History of Present Illness:  Troy Combs is a 84 y.o. male with a history of CAD s/p CABG x4V (2002), HTN, atrial flutter, known orthostatic hypotension who presents to clinic for evaluation of orthostatic hypotension.  In the past his Toprol XL was increased to 100 mg PO daily for symptomatic PVCs, but later decreased to 50 mg po daily for fatigue and dizziness. He was later switched to Coreg with improvement in symptoms (fatigue). He was sen by Dr. [name] in 2016. He complained of worsening DOE. Exercise Myoview stress test was low risk but did show chronotropic incompetence and it was switched to lexiscan. 2D ECHO at that time showed vigorous LV function (EF 65-70%), G1DD and mild bileaflet MVP. 48 hour holter monitor in 04/2015 showed very infrequent PVCS and PACs.  Seen back by Dr. [name] in 04/2016 and having worsening DOE. Another stress test was ordered which was low risk.  He saw [name] on 09/17/16 who felt orthostatic hypotension was likely related to longstanding systolic HTN. Given his supine HTN he was started on losartan 50mg  at night and stop Florinef. He also recommended an abdominal binder and stopping his alpha blocker (BPH).   He was then admitted 5/25-5/27/18 for orthostatic hypotension. His flomax and proscar were discontinued and Losartan decreased from 50mg  daily to 25mg  daily. Later he had to resume flomax and proscar due to unacceptable symptoms. He has been placed on midodrine since that time which has been increased to 10mg  q 4 hours. Dr. [name] recommended [name] but it looks like this may be cost prohibitive. She also recommended that he elevate his bed 10-20 degrees.   12/23/2016 - the patient now follows with specially for autonomic dysfunction in [facility]  [name], he communicates with the patient through Internet on a weekly basis and adjust his medicines. He is currently on Northera 200 mg PO TID, Midodrine  5 mg po Q4H, and pyridostigmine 60 mg po TID. he has been experiencing significant nausea for the last 6 weeks, he is currently debilitated he's not leaving the house because of that. He denies any falls. He hasn't had any dyspnea on exertion and mild chest pain at rest last night. He feels nauseous today in the clinic his resting blood pressure was 138/86 and heart rate 90 bpm. He was given 8 mg of ondansetron with relief in his symptoms.  03/31/2017 - the patient states that his dyspnea on exertion has been progressively worsening and now even when minimally exerted with activities around the house. He denies any exertional chest pain but had resting chest pain at night in his right shoulder couple weeks ago. He denies any palpitations or syncope. He continues to see a autonomic dysfunction specialist at [facility] for orthostatic hypotension and was started on Northera and advised to eat more salt including placing the teaspoon of salt into the drink and drink it daily to get her with increased amount of fluids. The patient states that these approach has been working for him for a while but lately his blood pressure increases especially toward the end of the day and he has had multiple measurements in 190s.  07/17/2017 this is 3 months follow-up, the patient brings a diary of his blood pressures with most of them in the  150-170 range. He hasn't had any episodes of orthostatic hypotension lately. Almost all of his blood pressures are elevated. For some reason he stopped taking losartan but he doesn't know why. He denies any recent chest pains and continues to have dyspnea on exertion. He underwent cardiac cath in December 2018 that showed severe three-vessel disease of his native coronary arteries and all of the grafts open, normal LVEF, medical therapy was  recommended.   Past Medical History:  Diagnosis Date  . Allergic rhinitis 09/20/2014  . Anemia, unspecified   . Arthritis   . Bladder neck obstruction   . Cataracts, bilateral   . Coronary atherosclerosis of unspecified type of vessel, native or graft   . Diminished hearing, bilateral   . Dysthymic disorder   . GERD (gastroesophageal reflux disease)   . Heart murmur    as a child  . Impaired glucose tolerance 12/02/2012  . Impotence of organic origin   . Lumbar radiculopathy   . Malignant melanoma of skin of upper limb, including shoulder ([place])   . Old myocardial infarction   . Personal history of malignant melanoma of skin   . Personal history of other diseases of digestive system   . Thoracic or lumbosacral neuritis or radiculitis, unspecified   . Unspecified essential hypertension     Past Surgical History:  Procedure Laterality Date  . ARTHRODESIS  07/06/2002   of left long finger distal interphalangeal joint with Kirschner wire fixation X 3  . ATRIAL ABLATION SURGERY  05/04/2001   Dr. [name]  . CARDIAC CATHETERIZATION  09/05/2008   Revealing 4 of 4 patent grafts with native multivessel coronary artery disease, EF  of 60% without regional wall motion abnormalities.  Marland Kitchen CATARACT EXTRACTION, BILATERAL    . CHOLECYSTECTOMY    . COLONOSCOPY    . CORONARY ANGIOPLASTY  1993  . CORONARY ARTERY BYPASS GRAFT  10/17/2000   [name]. [name], [place]     AFTER LIVER TRAUMA AND HAND SURGERY  . EYE SURGERY    . HERNIA REPAIR     BILATERAL  . LEFT HEART CATH AND CORS/GRAFTS ANGIOGRAPHY N/A 04/03/2017   Procedure: LEFT HEART CATH AND CORS/GRAFTS ANGIOGRAPHY;  Surgeon: Leonie Man, MD;  Location: [facility];  Service: Cardiovascular;  Laterality: N/A;  . LUMBAR LAMINECTOMY/DECOMPRESSION MICRODISCECTOMY N/A 02/02/2016   Procedure: MICRODISCECTOMY LUMBAR FOUR- LUMBAR FIVE;  Surgeon: Ashok Pall, MD;  Location: [facility];  Service: Neurosurgery;   Laterality: N/A;  MICRODISCECTOMY L4-L5  . LUMBAR LAMINECTOMY/DECOMPRESSION MICRODISCECTOMY Right 08/02/2016   Procedure: MICRODISCECTOMY LUMBAR FOUR- LUMBAR FIVE RIGHT;  Surgeon: Ashok Pall, MD;  Location: [facility];  Service: Neurosurgery;  Laterality: Right;  . punctured eardrum      to relieve blood build up   for ear infection  . TONSILLECTOMY      Current Medications: Outpatient Medications Prior to Visit  Medication Sig Dispense Refill  . acetaminophen (TYLENOL) 500 MG tablet Take 1,000-1,500 mg by mouth daily as needed for moderate pain or headache.    Marland Kitchen aspirin EC 81 MG tablet Take 81 mg by mouth at bedtime.     . finasteride (PROSCAR) 5 MG tablet Take 5 mg by mouth at bedtime.    . fludrocortisone (FLORINEF) 0.1 MG tablet Take 0.1 mg by mouth. If BP drops low in the 100s  9  . gabapentin (NEURONTIN) 100 MG capsule Take 1 tablet for night for 1 week, then increase to 2 tablets each night 60 capsule  5  . gabapentin (NEURONTIN) 300 MG capsule TAKE 1 CAPSULE THREE TIMES A DAY (START AFTER THE 100 MG THREE TIMES A DAY FIRST WEEK) 90 capsule 3  . nitroGLYCERIN (NITROSTAT) 0.4 MG SL tablet Place 1 tablet (0.4 mg total) under the tongue every 5 (five) minutes as needed for chest pain (x 3 doses). Reported on 06/15/2015 25 tablet 6  . rosuvastatin (CRESTOR) 10 MG tablet TAKE 1 TABLET DAILY 90 tablet 0  . sertraline (ZOLOFT) 50 MG tablet TAKE 1 TABLET DAILY (Patient taking differently: TAKE 1 TABLET DAILY AT BEDTIME) 90 tablet 3  . tamsulosin (FLOMAX) 0.4 MG CAPS capsule Take 1 capsule (0.4 mg total) by mouth daily after supper. (Patient taking differently: Take 0.4 mg by mouth at bedtime. ) 90 capsule 3  . hydrALAZINE (APRESOLINE) 10 MG tablet TK 1 T PO  if BP is above 180  0  . diphenhydrAMINE (BENADRYL) 25 mg capsule Take 2 capsules (50 mg total) by mouth on 12/6 at 6:30 am, with last dose of prednisone. (Patient not taking: Reported on 07/17/2017) 2 capsule 0  . Droxidopa 300 MG CAPS Take 300 mg  by mouth 3 (three) times daily.     Marland Kitchen losartan (COZAAR) 50 MG tablet Take 1 tablet (50 mg total) by mouth daily. (Patient not taking: Reported on 07/17/2017) 90 tablet 3  . predniSONE (DELTASONE) 50 MG tablet Take 1 tab by mouth on 12/5 at 6:30 pm, then take 1 tab by mouth on 12/6 at 12:30 am, then take 1 tab by mouth on 12/6 at 6:30 am, right before cath. (Patient not taking: Reported on 07/17/2017) 3 tablet 0   No facility-administered medications prior to visit.      Allergies:   Iohexol; Ciprofloxacin; and Other   Social History   Socioeconomic History  . Marital status: Married    Spouse name: Not on file  . Number of children: 5  . Years of education: Not on file  . Highest education level: Not on file  Occupational History  . Not on file  Social Needs  . Financial resource strain: Not on file  . Food insecurity:    Worry: Not on file    Inability: Not on file  . Transportation needs:    Medical: Not on file    Non-medical: Not on file  Tobacco Use  . Smoking status: Former Smoker    Last attempt to quit: 09/21/1955    Years since quitting: 61.8  . Smokeless tobacco: Never Used  Substance and Sexual Activity  . Alcohol use: Yes    Alcohol/week: 0.0 oz    Comment: one or two ounces a week  . Drug use: No  . Sexual activity: Not on file  Lifestyle  . Physical activity:    Days per week: Not on file    Minutes per session: Not on file  . Stress: Not on file  Relationships  . Social connections:    Talks on phone: Not on file    Gets together: Not on file    Attends religious service: Not on file    Active member of club or organization: Not on file    Attends meetings of clubs or organizations: Not on file    Relationship status: Not on file  Other Topics Concern  . Not on file  Social History Narrative   Lives with wife in a one story home.  Has 5 children.  Retired Freight forwarder for [employer].  Education: 2 years of college.  Family History:  The patient's family  history includes Heart disease in his father.     ROS:   Please see the history of present illness.    ROS All other systems reviewed and are negative.  PHYSICAL EXAM:   VS:  BP (!) 142/74 (BP Location: Left Arm, Patient Position: Sitting, Cuff Size: Normal)   Pulse 71   Ht 5\' 9"  (1.753 m)   Wt 180 lb (81.6 kg)   SpO2 96%   BMI 26.58 kg/m    GEN: Well nourished, well developed, in no acute distress  HEENT: normal  Neck: no JVD, carotid bruits, or masses Cardiac: RRR; no murmurs, rubs, or gallops,no edema  Respiratory:  clear to auscultation bilaterally, normal work of breathing GI: soft, nontender, nondistended, + BS MS: no deformity or atrophy  Skin: warm and dry, no rash Neuro:  Alert and Oriented x 3, Strength and sensation are intact Psych: euthymic mood, full affect   Wt Readings from Last 3 Encounters:  07/17/17 180 lb (81.6 kg)  06/04/17 181 lb (82.1 kg)  04/03/17 181 lb (82.1 kg)      Studies/Labs Reviewed:   EKG:  EKG is NOT ordered today.    Recent Labs: 03/31/2017: ALT 20; BUN 13; Creatinine, Ser 0.82; Hemoglobin 14.7; Platelets 192; Potassium 4.9; Sodium 145; TSH 1.790   Lipid Panel    Component Value Date/Time   CHOL 110 01/24/2016 0955   TRIG 133.0 01/24/2016 0955   TRIG 83 03/18/2006 0839   HDL 49.70 01/24/2016 0955   CHOLHDL 2 01/24/2016 0955   VLDL 26.6 01/24/2016 0955   LDLCALC 34 01/24/2016 0955    Additional studies/ records that were reviewed today include:  Echo: 08/16/2014 Study Conclusions - Left ventricle: The cavity size was normal. Wall thickness was increased in a pattern of mild LVH. There was mild focal basal hypertrophy of the septum. Systolic function was vigorous. The estimated ejection fraction was in the range of 65% to 70%. Wall motion was normal; there were no regional wall motion abnormalities. Doppler parameters are consistent with abnormal left ventricular relaxation (grade 1 diastolic dysfunction). -  Mitral valve: Calcified annulus. Mild prolapse, involving the anterior leaflet and the posterior leaflet. Impressions: - Vigorous LV function; grade 1 diastolic dysfunction; mild bileaflet MVP; trace MR and TR.  Lexiscan stress test: 06/2014 Impression Exercise Capacity: Lexiscan with low level exercise. BP Response: Normal blood pressure response. Clinical Symptoms: No chest pain. ECG Impression: No significant ST segment change suggestive of ischemia. Comparison with Prior Nuclear Study:Previous report was low risk scan  Overall Impression: Probable normal perfusion and minimal apical thnning. No evidence for significant ischemia or scar. Overall low risk scan  LV Ejection Fraction: 66%. LV Wall Motion: NL LV Function; NL Wall Motion   Lesxiscan Myoview 05/07/16 Study Highlights   Nuclear stress EF: 56%.  There was no ST segment deviation noted during stress.  The study is normal.  This is a low risk study.  The left ventricular ejection fraction is normal (55-65%).     48 hour holter monitor 04/2015 Study Highlights  Very frequent monomorphic PVCs - 16.700 in 48 hours. Some couplets.  Infrequent PACs.  Very frequent monomorphic PVCs - 16.700 in 48 hours.   EKG was performed today and personally reviewed it shows normal sinus rhythm with inferior MI age undetermined unchanged from prior.    ASSESSMENT & PLAN:   1. Dyspnea on exertion - progressively worsening, with history of CABG in 2002, stress test in  04/2016 with artifacts, cath on 04/03/18 showed  severe three-vessel disease of his native coronary arteries and all of the grafts open, normal LVEF, medical therapy was recommended. I will start lisinopril 5 mg daily and Toprol 25 mg daily.  2. CAD s/p CABG: as above, continue aspirin, Crestor.Toprol and lisinopril are being added today.  3. Symptomatic orthostatic hypotension, autonomic dysfunction, previously on Northera, followed by Dr Margrett Rud  in [place], his currently significantly hypertensive and no recent episodes of orthostatic hypotension.  4. Systemic hypertension: as above  5. PVCs: stable  6. HLD: continue Crestor, no side effects.  Follow-up with our blood pressure clinic in 2 weeks for medication titration, follow-up with me in 6 months.  Medication Adjustments/Labs and Tests Ordered: Current medicines are reviewed at length with the patient today.  Concerns regarding medicines are outlined above.  Medication changes, Labs and Tests ordered today are listed in the Patient Instructions below. Patient Instructions  Medication Instructions:   STOP TAKING LOSARTAN  START TAKING HYDRALAZINE 25 MG BY MOUTH TWICE DAILY AS NEEDED FOR SYSTOLIC BP GREATER THAN 063.  START TAKING METOPROLOL SUCCINATE (TOPROL XL) 25 MG ONCE DAILY  START TAKING LISINOPRIL 5 MG ONCE DAILY     Follow-Up:  MEGAN SUPPLE PHARMACIST IN 2 WEEKS   Your physician wants you to follow-up in: [place] will receive a reminder letter in the mail two months in advance. If you don't receive a letter, please call our office to schedule the follow-up appointment.        If you need a refill on your cardiac medications before your next appointment, please call your pharmacy.      Signed, Ena Dawley, MD  07/17/2017 12:59 PM    [address], [place], [place]  [address] Phone: [phone]; Fax: [phone]

## 2017-08-11 ENCOUNTER — Encounter (INDEPENDENT_AMBULATORY_CARE_PROVIDER_SITE_OTHER): Payer: Medicare Other | Admitting: Ophthalmology

## 2017-08-11 DIAGNOSIS — H35371 Puckering of macula, right eye: Secondary | ICD-10-CM | POA: Diagnosis not present

## 2017-08-11 DIAGNOSIS — H59033 Cystoid macular edema following cataract surgery, bilateral: Secondary | ICD-10-CM | POA: Diagnosis not present

## 2017-08-11 DIAGNOSIS — H35033 Hypertensive retinopathy, bilateral: Secondary | ICD-10-CM

## 2017-08-11 DIAGNOSIS — H43813 Vitreous degeneration, bilateral: Secondary | ICD-10-CM

## 2017-08-11 DIAGNOSIS — I10 Essential (primary) hypertension: Secondary | ICD-10-CM

## 2017-08-11 NOTE — Progress Notes (Signed)
Patient ID: Troy Combs                 DOB: 1931/10/12                      MRN: [id number]     HPI: Troy Combs is a 84 y.o. male patient of Dr. [name] who presents today for hypertension evaluation. PMH significant for CAD s/p CABG x4V (2002), HTN, atrial flutter, known orthostatic hypotension. With complicated medication/hyper/hypo-tension history. At his most recent OV he was started on lisinopril 5mg  daily as his home pressures were elevated [id number] systolic. He denied low pressures at that time.   He presents today for additional BP management. He reports that last instance of hypotension was about 3 months ago. He has not used hydralazine since starting lisinopril. He reports that he does get SOB recently. He reports gradually getting worse over the last few years, but this is not significantly changed with the changes in medications. He denies chest pain and headaches. Overall he feels that he is doing quite well more recently.   Current HTN meds:  Florinef 0.1mg  PRN BP drops in the low 100s - has not used in several months Hydralazine 25mg  BID systolic BP >914 - has not used in several months. lisinopril 5mg  daily Metoprolol succinate 25mg   Daily   BP goal: <150/90 given age and orthostatic hypotension  Family History: Heart disease in his father.     Social History: former smoker, drinks 1-2 ounces of alcohol per week  Exercise: he walks regularly weather permitting. He is active at home.   Home BP readings: home pressures ranging from 103-169/68-85 mostly 130s/low80s-hi70s  Wt Readings from Last 3 Encounters:  07/17/17 180 lb (81.6 kg)  06/04/17 181 lb (82.1 kg)  04/03/17 181 lb (82.1 kg)   BP Readings from Last 3 Encounters:  08/12/17 132/80  07/17/17 (!) 142/74  06/04/17 (!) 148/74   Pulse Readings from Last 3 Encounters:  08/12/17 60  07/17/17 71  06/04/17 80    Renal function: CrCl cannot be calculated (Unknown ideal weight.).  Past Medical  History:  Diagnosis Date  . Allergic rhinitis 09/20/2014  . Anemia, unspecified   . Arthritis   . Bladder neck obstruction   . Cataracts, bilateral   . Coronary atherosclerosis of unspecified type of vessel, native or graft   . Diminished hearing, bilateral   . Dysthymic disorder   . GERD (gastroesophageal reflux disease)   . Heart murmur    as a child  . Impaired glucose tolerance 12/02/2012  . Impotence of organic origin   . Lumbar radiculopathy   . Malignant melanoma of skin of upper limb, including shoulder ([place])   . Old myocardial infarction   . Personal history of malignant melanoma of skin   . Personal history of other diseases of digestive system   . Thoracic or lumbosacral neuritis or radiculitis, unspecified   . Unspecified essential hypertension     Current Outpatient Medications on File Prior to Visit  Medication Sig Dispense Refill  . aspirin EC 81 MG tablet Take 81 mg by mouth at bedtime.     . bromfenac (XIBROM) 0.09 % ophthalmic solution Place 1 drop into both eyes daily.    . finasteride (PROSCAR) 5 MG tablet Take 5 mg by mouth at bedtime.    . hydrALAZINE (APRESOLINE) 25 MG tablet Take 25 mg by mouth twice daily as needed for systolic BP greater than 782. 180 tablet  1  . lisinopril (PRINIVIL,ZESTRIL) 5 MG tablet Take 1 tablet (5 mg total) by mouth daily. 90 tablet 3  . metoprolol succinate (TOPROL XL) 25 MG 24 hr tablet Take 1 tablet (25 mg total) by mouth daily. 90 tablet 2  . PREDNISOLONE ACETATE OP Apply 1 drop to eye daily.    . rosuvastatin (CRESTOR) 10 MG tablet TAKE 1 TABLET DAILY 90 tablet 0  . sertraline (ZOLOFT) 50 MG tablet TAKE 1 TABLET DAILY (Patient taking differently: TAKE 1 TABLET DAILY AT BEDTIME) 90 tablet 3  . tamsulosin (FLOMAX) 0.4 MG CAPS capsule Take 1 capsule (0.4 mg total) by mouth daily after supper. (Patient taking differently: Take 0.4 mg by mouth at bedtime. ) 90 capsule 3  . acetaminophen (TYLENOL) 500 MG tablet Take 1,000-1,500 mg by  mouth daily as needed for moderate pain or headache.    . fludrocortisone (FLORINEF) 0.1 MG tablet Take 0.1 mg by mouth. If BP drops low in the 100s  9   No current facility-administered medications on file prior to visit.     Allergies  Allergen Reactions  . Iohexol Other (See Comments)     Code: HIVES, Desc: PATENT STATES HE IS ALLERGIC TO IV DYE 09/14/08/RM, Onset Date: [id number]   . Ciprofloxacin Hives and Rash    "Patient unsure" just know he has a reaction to med Medical record indicates allergic reaction  . Other     UNSPECIFIED REACTION  Angioplasty dye pt.>> Pt only recalls Had "very bad reaction"    Blood pressure 132/80, pulse 60.   Assessment/Plan: Hypertension: BMET today. BP today is at goal. He has not experienced an increase in hypotensive symptoms. Will continue all medications as prescribed. Have asked that he continue to monitor and call if pressures trend up or develops hypotensive symptoms. Follow up with Dr. [name] as recommended and HTN if needed.   Thank you, Lelan Pons. Patterson Hammersmith, [facility]  08/14/2017 7:43 AM  ADDENDUM: BMET WNL. Continue as above.

## 2017-08-12 ENCOUNTER — Encounter: Payer: Self-pay | Admitting: Pharmacist

## 2017-08-12 ENCOUNTER — Ambulatory Visit (INDEPENDENT_AMBULATORY_CARE_PROVIDER_SITE_OTHER): Payer: Medicare Other | Admitting: Pharmacist

## 2017-08-12 VITALS — BP 132/80 | HR 60

## 2017-08-12 DIAGNOSIS — R42 Dizziness and giddiness: Secondary | ICD-10-CM | POA: Diagnosis not present

## 2017-08-12 DIAGNOSIS — I10 Essential (primary) hypertension: Secondary | ICD-10-CM

## 2017-08-12 DIAGNOSIS — E782 Mixed hyperlipidemia: Secondary | ICD-10-CM

## 2017-08-12 MED ORDER — NITROGLYCERIN 0.4 MG SL SUBL: 0.4000 mg | SUBLINGUAL_TABLET | SUBLINGUAL | 6 refills | Status: DC | PRN

## 2017-08-12 NOTE — Patient Instructions (Signed)
Blood work today - BMET  Return for a follow up appointment as scheduled   Check your blood pressure at home daily (if able) and keep record of the readings.  Take your BP meds as follows: CONTINUE all medications as prescribed  Bring all of your meds, your BP cuff and your record of home blood pressures to your next appointment.  Exercise as you're able, try to walk approximately 30 minutes per day.  Keep salt intake to a minimum, especially watch canned and prepared boxed foods.  Eat more fresh fruits and vegetables and fewer canned items.  Avoid eating in fast food restaurants.    HOW TO TAKE YOUR BLOOD PRESSURE: . Rest 5 minutes before taking your blood pressure. .  Don't smoke or drink caffeinated beverages for at least 30 minutes before. . Take your blood pressure before (not after) you eat. . Sit comfortably with your back supported and both feet on the floor (don't cross your legs). . Elevate your arm to heart level on a table or a desk. . Use the proper sized cuff. It should fit smoothly and snugly around your bare upper arm. There should be enough room to slip a fingertip under the cuff. The bottom edge of the cuff should be 1 inch above the crease of the elbow. . Ideally, take 3 measurements at one sitting and record the average.

## 2017-08-13 LAB — BASIC METABOLIC PANEL
BUN/Creatinine Ratio: 23 (ref 10–24)
BUN: 18 mg/dL (ref 8–27)
CO2: 23 mmol/L (ref 20–29)
Calcium: 9.6 mg/dL (ref 8.6–10.2)
Chloride: 106 mmol/L (ref 96–106)
Creatinine, Ser: 0.77 mg/dL (ref 0.76–1.27)
GFR calc Af Amer: 97 mL/min/{1.73_m2} (ref >59)
GFR calc non Af Amer: 84 mL/min/{1.73_m2} (ref >59)
Glucose: 99 mg/dL (ref 65–99)
Potassium: 4.5 mmol/L (ref 3.5–5.2)
Sodium: 143 mmol/L (ref 134–144)

## 2017-08-28 ENCOUNTER — Ambulatory Visit (INDEPENDENT_AMBULATORY_CARE_PROVIDER_SITE_OTHER): Payer: Medicare Other | Admitting: Internal Medicine

## 2017-08-28 ENCOUNTER — Ambulatory Visit (INDEPENDENT_AMBULATORY_CARE_PROVIDER_SITE_OTHER)
Admission: RE | Admit: 2017-08-28 | Discharge: 2017-08-28 | Disposition: A | Discharge status: Home / Self Care | Payer: Medicare Other | Source: Ambulatory Visit | Attending: Internal Medicine | Admitting: Internal Medicine

## 2017-08-28 ENCOUNTER — Encounter: Payer: Self-pay | Admitting: Internal Medicine

## 2017-08-28 VITALS — BP 124/64 | HR 85 | Ht 69.5 in | Wt 176.0 lb

## 2017-08-28 DIAGNOSIS — R0609 Other forms of dyspnea: Secondary | ICD-10-CM

## 2017-08-28 DIAGNOSIS — R0600 Dyspnea, unspecified: Secondary | ICD-10-CM

## 2017-08-28 NOTE — Patient Instructions (Addendum)
Please remember to go to the  x-ray department downstairs in the basement  for your tests - we will call you with the results when they are available.     Start regular paced exercise and try to build up to 30 minutes a day if possible  Please schedule a follow up office visit w/in 2 weeks, sooner if needed with pfts on return

## 2017-08-28 NOTE — Progress Notes (Signed)
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 Pulmonary Consult    Referred by Dr. Antony Contras. Pt c/o DOE x 6 months. He states that he is SOB with almost any exertion, such as from walking from lobby to exam room today.    never felt recovered p back surgery = MMRC3 = can't walk 100 yards even at a slow pace at a flat grade s stopping due to sob  Denies  Cough but active sensation of pnds/ throat tickle  Lie flat ok   LHC  04/03/17  Ok grafts / fxn though lvedp 20  Throat tickle since on acei but tolerable and note acei just added 07/17/17  No obvious day to day or daytime variability or assoc excess/ purulent sputum or mucus plugs or hemoptysis or cp or chest tightness, subjective wheeze or overt sinus or hb symptoms. No unusual exposure hx or h/o childhood pna/ asthma or knowledge of premature birth.  Sleeping  Ok flat  without nocturnal  or early am exacerbation  of respiratory  c/o's or need for noct saba. Also denies any obvious fluctuation of symptoms with weather or environmental changes or other aggravating or alleviating factors except as outlined above   Current Allergies, Complete Past Medical History, Past Surgical History, Family History, and Social History were reviewed in [facility] record.  ROS  The following are not active complaints unless bolded Hoarseness, sore throat, dysphagia,  dental problems, itching, sneezing,  nasal congestion or discharge of excess mucus or purulent secretions, ear ache,   fever, chills, sweats, unintended wt loss or wt gain, classically pleuritic or exertional cp,  orthopnea pnd or arm/hand swelling  or leg swelling, presyncope, palpitations, abdominal pain, anorexia, nausea, vomiting, diarrhea  or change in bowel habits or change in bladder habits, change in stools or change in urine, dysuria, hematuria,  rash, arthralgias, visual complaints, headache, numbness, weakness or ataxia or problems with walking or coordination,  change in mood or  memory.        Current Meds  Medication Sig  . aspirin EC 81 MG tablet Take 81 mg by mouth at bedtime.   . bromfenac (XIBROM) 0.09 % ophthalmic solution Place 1 drop into both eyes daily.  . finasteride (PROSCAR) 5 MG tablet Take 5 mg by mouth at bedtime.  . hydrALAZINE (APRESOLINE) 25 MG tablet Take 25 mg by mouth twice daily as needed for systolic BP greater than 786.  Marland Kitchen lisinopril (PRINIVIL,ZESTRIL) 5 MG tablet Take 1 tablet (5 mg total) by mouth daily.  . metoprolol succinate (TOPROL XL) 25 MG 24 hr tablet Take 1 tablet (25 mg total) by mouth daily.  . nitroGLYCERIN (NITROSTAT) 0.4 MG SL tablet Place 1 tablet (0.4 mg total) under the tongue every 5 (five) minutes as needed for chest pain (x 3 doses). Reported on  06/15/2015  . PREDNISOLONE ACETATE OP Apply 1 drop to eye daily.  . rosuvastatin (CRESTOR) 10 MG tablet TAKE 1 TABLET DAILY  . sertraline (ZOLOFT) 50 MG tablet TAKE 1 TABLET DAILY (Patient taking differently: TAKE 1 TABLET DAILY AT BEDTIME)  . tamsulosin (FLOMAX) 0.4 MG CAPS capsule Take 1 capsule (0.4 mg total) by mouth daily after supper. (Patient taking differently: Take 0.4 mg by mouth at bedtime. )      Review of Systems     Objective:   Physical Exam     amb wm nad occ throat clearing   Wt Readings from Last 3 Encounters:  08/28/17 176 lb (79.8 kg)  07/17/17 180 lb (81.6 kg)   06/04/17 181 lb (82.1 kg)     Vital signs reviewed - Note on arrival 02 sats  94% on RA     HEENT: nl dentition, turbinates bilaterally, and oropharynx. Nl external ear canals without cough reflex   NECK :  without JVD/Nodes/TM/ nl carotid upstrokes bilaterally   LUNGS: no acc muscle use,  Nl contour chest which is clear to A and P bilaterally without cough on insp or exp maneuvers   CV:  RRR  no s3 or murmur or increase in P2, and no edema   ABD:  soft and nontender with nl inspiratory excursion in the supine position. No bruits or organomegaly appreciated, bowel sounds nl  MS:  Nl gait/ ext warm without deformities, calf tenderness, cyanosis or clubbing No obvious joint restrictions   SKIN: warm and dry without lesions    NEURO:  alert, approp, nl sensorium with  no motor or cerebellar deficits apparent.    CXR PA and Lateral:   08/28/2017 :    I personally reviewed images and  impression as follows:   Decreased lung volumes, some pleural scarring but no def asbestos plaques  Labs  reviewed:      Chemistry      Component Value Date/Time   NA 143 08/12/2017 1517   K 4.5 08/12/2017 1517   CL 106 08/12/2017 1517   CO2 23 08/12/2017 1517   BUN 18 08/12/2017 1517   CREATININE 0.77 08/12/2017 1517   CREATININE 0.86 10/23/2016 1211      Component Value Date/Time   CALCIUM 9.6 08/12/2017 1517   ALKPHOS 56 03/31/2017 1244   AST 17 03/31/2017 1244   ALT 20 03/31/2017 1244   BILITOT 0.7 03/31/2017 1244        Lab Results  Component Value Date   WBC 10.3 03/31/2017   HGB 14.7 03/31/2017   HCT 43.2 03/31/2017   MCV 88 03/31/2017   PLT 192 03/31/2017      Lab Results  Component Value Date   TSH 1.790 03/31/2017     Lab Results  Component Value Date   PROBNP 85.3 08/13/2010       Lab Results  Component Value Date   ESRSEDRATE 11 10/23/2016   ESRSEDRATE 6 06/15/2015   ESRSEDRATE 6 08/13/2010           Assessment:

## 2017-08-29 ENCOUNTER — Encounter: Payer: Self-pay | Admitting: Internal Medicine

## 2017-08-29 NOTE — Assessment & Plan Note (Addendum)
LHC  04/03/17  Ok grafts / fxn though lvedp 20  -  08/28/2017  Walked RA x 3 laps @ 185 ft each stopped due to  End of study, sats 94% min sob  Symptoms are markedly disproportionate to objective findings and not clear to what extent this is actually a pulmonary  problem but pt does appear to have difficult to sort out respiratory symptoms of unknown origin for which  DDX  = almost all start with A and  include Adherence, Ace Inhibitors, Acid Reflux, Active Sinus Disease, Alpha 1 Antitripsin deficiency, Anxiety masquerading as Airways dz,  ABPA,  Allergy(esp in young), Aspiration (esp in elderly), Adverse effects of meds,  Active smokers, A bunch of PE's/clot burden (a few small clots can't cause this syndrome unless there is already severe underlying pulm or vascular dz with poor reserve),  Anemia or thyroid disorder, plus two Bs  = Bronchiectasis and Beta blocker use..and one C= CHF    ? acei effects > he does have throat tickle/ clearing but this just started sev weeks prior to OV  p being started on lisinopril 07/17/17 per records which would not explain the chronic doe dating back well before that  ? Anxiety/ depression/ deconditioning assoc with dx of MVP > usually at the bottom of this list of usual suspects but should be   higher on this pt's based on H and P and note already on psychotropics and may interfere with adherence and also interpretation of response or lack thereof to symptom management which can be quite subjective.  rec start building up to 30 minutes walking per day by pacing   ? Allergy/ asthma > no noct symptoms or cough > return for full pfts  ? Asbestosis > not usually on the "A " list but he has been exposed and does have low lung volumes. Not apparent on plain cxr and may be moot issue anyway but will consider CT chest at next ov if can't explain his symptoms and they persist with retraining.   ? A bunch of PE's > unlikely given chronicity but note we did not do RHC or echo  recently so need to keep in mind TEPAH.in ddx   ? Anemia/ thyroid dz > ruled out by labs recently as listed above   ? chf > note elevated edp at rest may be much higher with ex ? Will need cpst to sort out if not better with re-conditionng

## 2017-09-06 ENCOUNTER — Other Ambulatory Visit: Payer: Self-pay | Admitting: Internal Medicine

## 2017-09-18 ENCOUNTER — Ambulatory Visit (INDEPENDENT_AMBULATORY_CARE_PROVIDER_SITE_OTHER): Payer: Medicare Other | Admitting: Internal Medicine

## 2017-09-18 ENCOUNTER — Encounter: Payer: Self-pay | Admitting: Internal Medicine

## 2017-09-18 VITALS — BP 118/72 | HR 64 | Ht 69.0 in | Wt 175.4 lb

## 2017-09-18 DIAGNOSIS — R0600 Dyspnea, unspecified: Secondary | ICD-10-CM

## 2017-09-18 DIAGNOSIS — I10 Essential (primary) hypertension: Secondary | ICD-10-CM | POA: Diagnosis not present

## 2017-09-18 DIAGNOSIS — R0609 Other forms of dyspnea: Secondary | ICD-10-CM

## 2017-09-18 LAB — PULMONARY FUNCTION TEST
DL/VA % pred: 68 %
DL/VA: 3.09 ml/min/mmHg/L
DLCO unc % pred: 63 %
DLCO unc: 19.54 ml/min/mmHg
FEF 25-75 Post: 3.12 L/sec
FEF 25-75 Pre: 1.75 L/sec
FEF2575-%Change-Post: 78 %
FEF2575-%Pred-Post: 184 %
FEF2575-%Pred-Pre: 103 %
FEV1-%Change-Post: 15 %
FEV1-%Pred-Post: 84 %
FEV1-%Pred-Pre: 73 %
FEV1-Post: 2.19 L
FEV1-Pre: 1.9 L
FEV1FVC-%Change-Post: 12 %
FEV1FVC-%Pred-Pre: 109 %
FEV6-%Change-Post: 2 %
FEV6-%Pred-Post: 73 %
FEV6-%Pred-Pre: 71 %
FEV6-Post: 2.51 L
FEV6-Pre: 2.45 L
FEV6FVC-%Pred-Post: 108 %
FEV6FVC-%Pred-Pre: 108 %
FVC-%Change-Post: 2 %
FVC-%Pred-Post: 67 %
FVC-%Pred-Pre: 66 %
FVC-Post: 2.51 L
FVC-Pre: 2.45 L
Post FEV1/FVC ratio: 87 %
Post FEV6/FVC ratio: 100 %
Pre FEV1/FVC ratio: 78 %
Pre FEV6/FVC Ratio: 100 %
RV % pred: 63 %
RV: 1.71 L
TLC % pred: 68 %
TLC: 4.74 L

## 2017-09-18 MED ORDER — TELMISARTAN 40 MG PO TABS: 40.0000 mg | ORAL_TABLET | Freq: Every day | ORAL | 11 refills | Status: DC

## 2017-09-18 NOTE — Progress Notes (Signed)
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 Pulmonary Consult    Referred by Dr. Antony Contras. Pt c/o DOE x 6 months. He states that he is SOB with almost any exertion, such as from walking from lobby to exam room today.    never felt recovered p back surgery = MMRC3 = can't walk 100 yards even at a slow pace at a flat grade s stopping due to sob  Denies  Cough but active sensation of pnds/ throat tickle  Lie flat ok   LHC  04/03/17  Ok grafts / fxn though lvedp 20  Throat tickle since on acei but tolerable and note acei just added 07/17/17 rec Start regular paced exercise and try to build up to 30 minutes a day if possible Please schedule a follow up office visit w/in 2 weeks, sooner if needed with pfts on return     09/18/2017  f/u ov/Troy Combs re:  Doe  Chief Complaint  Patient presents with  . Follow-up    follow up dyspnea on exertion. patients breathing unchanged.   Dyspnea:  Twice daily walks outside x 5 minutes s sob and really Not limited by breathing from desired activities   Cough: daytime assoc tickle s noct  Sleep: most times fine SABA use:  Had it before, sat in a drawer for years then tossed it   No change p saba in pfts lab at ov  in terms of any symptom changes  No obvious day to day or daytime variability or assoc excess/ purulent sputum or mucus plugs or  hemoptysis or cp or chest tightness, subjective wheeze or overt sinus or hb symptoms. No unusual exposure hx or h/o childhood pna/ asthma or knowledge of premature birth.  Sleeping  Ok without nocturnal  or early am exacerbation  of respiratory  c/o's or need for noct saba. Also denies any obvious fluctuation of symptoms with weather or environmental changes or other aggravating or alleviating factors except as outlined above   Current Allergies, Complete Past Medical History, Past Surgical History, Family History, and Social History were reviewed in [employer] record.  ROS  The following are not active complaints unless bolded Hoarseness, sore throat, dysphagia, dental problems, itching, sneezing,  nasal congestion or discharge of excess mucus or purulent secretions, ear ache,   fever, chills, sweats, unintended wt loss or wt gain, classically pleuritic or exertional cp,  orthopnea pnd or arm/hand swelling  or leg swelling, presyncope, palpitations, abdominal pain, anorexia, nausea, vomiting, diarrhea  or change in bowel habits or change in bladder habits, change in stools or change in urine, dysuria, hematuria,  rash, arthralgias, visual complaints, headache, numbness, weakness or ataxia or problems with walking or coordination,  change in mood or  memory.  Current Meds  Medication Sig  . aspirin EC 81 MG tablet Take 81 mg by mouth at bedtime.   . bromfenac (XIBROM) 0.09 % ophthalmic solution Place 1 drop into both eyes daily.  . finasteride (PROSCAR) 5 MG tablet Take 5 mg by mouth at bedtime.  . hydrALAZINE (APRESOLINE) 25 MG tablet Take 25 mg by mouth twice daily as needed for systolic BP greater than 937.  . metoprolol succinate (TOPROL XL) 25 MG 24 hr tablet Take 1 tablet (25 mg total) by mouth daily.  . nitroGLYCERIN (NITROSTAT) 0.4 MG SL tablet Place 1 tablet (0.4 mg total) under the tongue every 5 (five) minutes as needed for chest pain (x 3 doses). Reported on  06/15/2015  . rosuvastatin (CRESTOR) 10 MG tablet TAKE 1 TABLET DAILY  . sertraline (ZOLOFT) 50 MG tablet TAKE 1 TABLET DAILY (Patient taking differently: TAKE 1 TABLET DAILY AT BEDTIME)  . tamsulosin (FLOMAX) 0.4 MG CAPS capsule Take 1 capsule (0.4 mg total) by mouth daily after supper. (Patient taking differently: Take 0.4 mg by mouth at bedtime. )  .  ] lisinopril (PRINIVIL,ZESTRIL) 5 MG tablet Take 1 tablet (5 mg total) by mouth daily.  .                  Objective:   Physical Exam     amb wm with occ throat clearing   09/18/2017        175   08/28/17 176 lb (79.8 kg)  07/17/17 180 lb (81.6 kg)  06/04/17 181 lb (82.1 kg)      Vital signs reviewed - Note on arrival 02 sats  94% on RA    HEENT: nl dentition, turbinates bilaterally, and oropharynx. Nl external ear canals without cough reflex   NECK :  without JVD/Nodes/TM/ nl carotid upstrokes bilaterally   LUNGS: no acc muscle use,  Nl contour chest which is clear to A and P bilaterally without cough on insp or exp maneuvers   CV:  RRR  no s3 or murmur or increase in P2, and no edema   ABD:  soft and nontender with nl inspiratory excursion in the supine position. No bruits or organomegaly appreciated, bowel sounds nl  MS:  Nl gait/ ext warm without deformities, calf tenderness, cyanosis or clubbing No obvious joint restrictions   SKIN: warm and dry without lesions    NEURO:  alert, approp, nl sensorium with  no motor or cerebellar deficits apparent.             I personally reviewed images and agree with radiology impression as follows:  CXR:   08/29/17 1.  Prior CABG.  Heart size stable.  2. Mild bibasilar atelectasis/scarring. No change from prior exam. No acute infiltrate.                 Assessment:

## 2017-09-18 NOTE — Progress Notes (Signed)
PFT completed 09/18/17

## 2017-09-18 NOTE — Assessment & Plan Note (Signed)
In the best review of chronic cough to date ( NEJM 2016 375 [phone]) ,  ACEi are now felt to cause cough in up to  20% of pts which is a 4 fold increase from previous reports and does not include the variety of non-specific complaints we see in pulmonary clinic in pts on ACEi but previously attributed to another dx like  Copd/asthma and  include PNDS, throat congestion, "bronchitis", unexplained dyspnea and noct "strangling" sensations, and hoarseness, but also  atypical /refractory GERD symptoms like dysphagia and "bad heartburn"   The only way I know  to prove this is not an "ACEi Case" is a trial off ACEi x a minimum of 6 weeks then regroup.   Try micardis 40 mg one daily and f/u here in 2 months if not better.    I had an extended discussion with the patient reviewing all relevant studies completed to date and  lasting 15 to 20 minutes of a 25 minute visit    Each maintenance medication was reviewed in detail including most importantly the difference between maintenance and prns and under what circumstances the prns are to be triggered using an action plan format that is not reflected in the computer generated alphabetically organized AVS.    Please see AVS for specific instructions unique to this visit that I personally wrote and verbalized to the the pt in detail and then reviewed with pt  by my nurse highlighting any  changes in therapy recommended at today's visit to their plan of care.

## 2017-09-18 NOTE — Patient Instructions (Signed)
Stop lisnopril for two months and if not better to your satisfaction return here and start micardis 40 mg daily    To get the most out of exercise, you need to be continuously aware that you are short of breath, but never out of breath, for 30 minutes daily. As you improve, it will actually be easier for you to do the same amount of exercise  in  30 minutes so always push to the level where you are short of breath.    Pulmonary follow up is as needed

## 2017-09-18 NOTE — Assessment & Plan Note (Addendum)
LHC  04/03/17  Ok grafts / fxn though lvedp 20  -  08/28/2017  Walked RA x 3 laps @ 185 ft each stopped due to  End of study, sats 94% min sob   -  PFT's  09/18/2017  FEV1 2.19 (84 % ) ratio 87  p 15 % improvement from saba p nothing prior to study with DLCO  63 % corrects to 68  % for alv volume  With def truncation of insp portion of f/v loop c/w acei effects on upper airway   - trial off acei  Next  Logical step  Although he does have very mild asthma, the absence of variability with daytime function, lack of convincing response to prev saba and lack of any noct or early am sob/cough or wheeze even with uri's all strongly against asthma and the changes on insp portion of f/v loop all favor dx =  Upper airway cough syndrome (previously labeled PNDS),  is so named because it's frequently impossible to sort out how much is  CR/sinusitis with freq throat clearing (which can be related to primary GERD)   vs  causing  secondary (" extra esophageal")  GERD from wide swings in gastric pressure that occur with throat clearing, often  promoting self use of mint and menthol lozenges that reduce the lower esophageal sphincter tone and exacerbate the problem further in a cyclical fashion.   These are the same pts (now being labeled as having "irritable larynx syndrome" by some cough centers) who not infrequently have a history of having failed to tolerate ace inhibitors,  dry powder inhalers or biphosphonates or report having atypical/extraesophageal reflux symptoms that don't respond to standard doses of PPI  and are easily confused as having aecopd or asthma flares by even experienced allergists/ pulmonologists (myself included).   First step therefor is trial off acei x 2 months and back here if not better to his satisfaction  Also rec reconditioning  see avs for instructions unique to this ov

## 2017-09-19 ENCOUNTER — Ambulatory Visit (INDEPENDENT_AMBULATORY_CARE_PROVIDER_SITE_OTHER): Payer: Medicare Other | Admitting: Neurology

## 2017-09-19 ENCOUNTER — Other Ambulatory Visit: Payer: Self-pay

## 2017-09-19 ENCOUNTER — Encounter: Payer: Self-pay | Admitting: Neurology

## 2017-09-19 VITALS — BP 130/74 | HR 62 | Ht 69.0 in | Wt 177.0 lb

## 2017-09-19 DIAGNOSIS — G629 Polyneuropathy, unspecified: Secondary | ICD-10-CM

## 2017-09-19 DIAGNOSIS — G4452 New daily persistent headache (NDPH): Secondary | ICD-10-CM

## 2017-09-19 NOTE — Patient Instructions (Signed)
1. Continue to monitor symptoms, if pain in the feet or headaches start affecting quality of life, call our office and we will plan to start symptomatic treatment with gabapentin 2. Continue fall safety 3. Follow-up in 6 months, call for any changes

## 2017-09-19 NOTE — Progress Notes (Signed)
NEUROLOGY FOLLOW UP OFFICE NOTE  Troy Combs [id number]  DOB: [birth year]  HISTORY OF PRESENT ILLNESS: I had the pleasure of seeing [name] in follow-up in the neurology clinic on 09/19/2017. He is alone in the office today. He was last seen 3 months ago for orthostatic hypotension that worsened after lumbar surgery in April 2018, and new symptoms of headaches after a fall in January 2019. He has seen Cardiologist [name] in [place] with a diagnosis of neurogenic orthostatic hypotension, he reports that the dizziness has gone away but he feels a little lightheaded all the time. It appears he has not had any significant hypotension, and sitting BP has been a little elevated. On his last visit, he was reporting new headaches after a fall while visiting [place] in January 2019. Pain was in the back of his head, no associated symptoms. He was given a prescription for gabapentin but it appears there may have been an insurance issue and he never filled the prescription. He continues to have daily headaches when he wakes up, 2 to 3 over 10 in intensity with pressure in the occipital regions, no nausea/vomiting, vision changes, significant neck pain. He has blurred vision with right macular degeneration and has noticed droopy eyelid on the right. No diplopia or dysarthria/dysphagia. He feels good with his current quality of life, he would rate it an 8/10. He denies any falls. He continues to deal with neuropathy, he has tingling in his toes all the time, occasional sharp stabbing pain in his toes waking him up in the middle of the night. No paresthesias in his hands but he has lost dexterity.   HPI 02/2015: This is a pleasant 84 yo RH man who presented for vertigo and headaches. He had been previously seen by one of my partners, Dr. [name] in 2014 for cervicogenic headaches. He had vertigo that started in April/May 2016. He would have a sensation of tilting when he sat on the edge of the bed, with  violent vomiting. He was prescribed meclizine, which would help sometimes. There was concern this was due to his beta-blocker, and after adjustment, he reported the intense dizziness went away, but he still had dizziness if moving too quickly, turning his head, or standing up. He was in the ER in September 2016 due to significant vertigo with dull headache. He had an MRI brain without contrast which I personally reviewed, no acute changes, there was chronic microvascular disease and generalized atrophy. He saw his PCP and was referred for vestibular rehab. He reports that vestibular rehab worked almost immediately. The dizziness has since resolved. He has a little lightheadedness and feels off balance and unsteady, no falls. He has occasional tingling and numbness in the last 3 digits of both feet, and occasionally pins and needles sensation in the soles of his feet. He has had monocular vertical diplopia for many years, and noticed that this was less when he takes nortriptyline. He had been having occipital headaches, but reported those are pretty much gone, with 1 to 2 over 10 pain, no associated nausea, vomiting, photo/phonophobia. He has some neck crepitus, "like gravel when turning my head," and occasional low back pain. He has some constipation. No family history of similar symptoms.  He presented 10/23/16 for different symptoms that he and his wife report started after back surgery in April 2018. On review of notes, he had a durotomy while retracting the thecal sac. He was patched and kept on bedrest, and there was no  evidence of leak through the skin. He was discharged to rehab, and while in rehab he was noted to have orthostatic hypotension, with significant drop with exertion and associated dizziness. He was seen a few days later by Cardiology, and there is note that he has been known to have orthostatic hypotension and complained of exertional dizziness in the past. He agreed this was the case, but since  surgery, symptoms have been much worse. He could not get to the door without feeling like he would pass out. He was noted to have a drop in SBP from 143 to 90 in the office. Coreg was discontinued. He was started on Florinef 0.1mg  daily. He saw cardiologist [name] last month, with note of orthostatic hypotension antedating back surgery, mechanism unclear but likely related to long-standing systolic hypertension. His supine pressures were over 170, and Losartan was started. Abdominal binder was advised. He continued to have worsening of dizziness with any activity, and was admitted to [facility] on 09/20/16, with note of 50-point drop from supine to standing. He became hypertensive in the ER. Florinef and BPH (Finasteride and Flomax) medications were discontinued and Losartan dose reduced, with note of improvement in orthostatic hypotension. Since hospital discharge, he would have very labile BP changes, 161/93 supine, 79/54 standing. They called [name] who recommended increasing Losartan back to 50mg , and restarting Flomax. He was using the abdominal binder. He was advised to start ProAmantine 5mg  every 4 hours while awake, then increased to 10mg  q4hrs. He reported wearing an abdominal binder, using compression stocking, taking midodrone, and stopping finasteride, but symptoms continue to worsen. He was referred to Neurology due to symptoms worsening after Neurosurgery with CSF leak.   He reports that he does not think he was getting this significant dizziness before the surgery. He described the dizziness as "disorientation more than anything." He has not passed out. He has been having headaches but they are not positional in nature. They were more severe in rehab, still occurring every other day, improving with 2 Equates (aspirin, acetaminophen, caffeine). He feels pressure sometimes in the back of his head or diffusely, no associated nausea/vomiting, photo/phonophobia, it does not seem to improve when supine. He  has chronic tinnitus that is unchanged. He has a history of peripheral neuropathy and denies any numbness or tingling in his extremities, but has new symptoms of muscle spasms in his arms, legs and back for the past 2 weeks. He has chronic vertical diplopia that is better with nortriptyline. He denies any dysphagia, dysarthria, bowel/bladder dysfunction, no back pain. They bring a calendar of his BP readings, this morning he went from 138/88 sitting (HR 82) to 80/52 standing (HR 97).    PAST MEDICAL HISTORY: Past Medical History:  Diagnosis Date  . Allergic rhinitis 09/20/2014  . Anemia, unspecified   . Arthritis   . Bladder neck obstruction   . Cataracts, bilateral   . Coronary atherosclerosis of unspecified type of vessel, native or graft   . Diminished hearing, bilateral   . Dysthymic disorder   . GERD (gastroesophageal reflux disease)   . Heart murmur    as a child  . Impaired glucose tolerance 12/02/2012  . Impotence of organic origin   . Lumbar radiculopathy   . Malignant melanoma of skin of upper limb, including shoulder ([place])   . Old myocardial infarction   . Personal history of malignant melanoma of skin   . Personal history of other diseases of digestive system   . Thoracic or lumbosacral  neuritis or radiculitis, unspecified   . Unspecified essential hypertension     MEDICATIONS: Current Outpatient Medications on File Prior to Visit  Medication Sig Dispense Refill  . aspirin EC 81 MG tablet Take 81 mg by mouth at bedtime.     . bromfenac (XIBROM) 0.09 % ophthalmic solution Place 1 drop into both eyes daily.    . finasteride (PROSCAR) 5 MG tablet Take 5 mg by mouth at bedtime.    . hydrALAZINE (APRESOLINE) 25 MG tablet Take 25 mg by mouth twice daily as needed for systolic BP greater than 734. 180 tablet 1  . metoprolol succinate (TOPROL XL) 25 MG 24 hr tablet Take 1 tablet (25 mg total) by mouth daily. 90 tablet 2  . nitroGLYCERIN (NITROSTAT) 0.4 MG SL tablet Place 1 tablet  (0.4 mg total) under the tongue every 5 (five) minutes as needed for chest pain (x 3 doses). Reported on 06/15/2015 25 tablet 6  . rosuvastatin (CRESTOR) 10 MG tablet TAKE 1 TABLET DAILY 90 tablet 0  . sertraline (ZOLOFT) 50 MG tablet TAKE 1 TABLET DAILY (Patient taking differently: TAKE 1 TABLET DAILY AT BEDTIME) 90 tablet 3  . tamsulosin (FLOMAX) 0.4 MG CAPS capsule Take 1 capsule (0.4 mg total) by mouth daily after supper. (Patient taking differently: Take 0.4 mg by mouth at bedtime. ) 90 capsule 3  . telmisartan (MICARDIS) 40 MG tablet Take 1 tablet (40 mg total) by mouth daily. 30 tablet 11   No current facility-administered medications on file prior to visit.     ALLERGIES: Allergies  Allergen Reactions  . Iohexol Other (See Comments)     Code: HIVES, Desc: PATENT STATES HE IS ALLERGIC TO IV DYE 09/14/08/RM, Onset Date: [id number]   . Ciprofloxacin Hives and Rash    "Patient unsure" just know he has a reaction to med Medical record indicates allergic reaction  . Other     UNSPECIFIED REACTION  Angioplasty dye pt.>> Pt only recalls Had "very bad reaction"    FAMILY HISTORY: Family History  Problem Relation Age of Onset  . Heart disease Father   . Ataxia Neg Hx   . Chorea Neg Hx   . Dementia Neg Hx   . Mental retardation Neg Hx   . Migraines Neg Hx   . Multiple sclerosis Neg Hx   . Neurofibromatosis Neg Hx   . Neuropathy Neg Hx   . Parkinsonism Neg Hx   . Seizures Neg Hx   . Stroke Neg Hx     SOCIAL HISTORY: Social History   Socioeconomic History  . Marital status: Married    Spouse name: Not on file  . Number of children: 5  . Years of education: Not on file  . Highest education level: Not on file  Occupational History  . Not on file  Social Needs  . Financial resource strain: Not on file  . Food insecurity:    Worry: Not on file    Inability: Not on file  . Transportation needs:    Medical: Not on file    Non-medical: Not on file  Tobacco Use  . Smoking  status: Former Smoker    Packs/day: 1.00    Years: 3.00    Pack years: 3.00    Types: Cigarettes    Last attempt to quit: 09/21/1955    Years since quitting: 62.0  . Smokeless tobacco: Never Used  Substance and Sexual Activity  . Alcohol use: Yes    Alcohol/week: 0.0 oz  Comment: one or two ounces a week  . Drug use: No  . Sexual activity: Not on file  Lifestyle  . Physical activity:    Days per week: Not on file    Minutes per session: Not on file  . Stress: Not on file  Relationships  . Social connections:    Talks on phone: Not on file    Gets together: Not on file    Attends religious service: Not on file    Active member of club or organization: Not on file    Attends meetings of clubs or organizations: Not on file    Relationship status: Not on file  . Intimate partner violence:    Fear of current or ex partner: Not on file    Emotionally abused: Not on file    Physically abused: Not on file    Forced sexual activity: Not on file  Other Topics Concern  . Not on file  Social History Narrative   Lives with wife in a one story home.  Has 5 children.  Retired Freight forwarder for [employer].  Education: 2 years of college.     REVIEW OF SYSTEMS: Constitutional: No fevers, chills, or sweats, no generalized fatigue, change in appetite Eyes: No visual changes, double vision, eye pain Ear, nose and throat: No hearing loss, ear pain, nasal congestion, sore throat Cardiovascular: No chest pain, palpitations Respiratory:  No shortness of breath at rest or with exertion, wheezes GastrointestinaI: No nausea, vomiting, diarrhea, abdominal pain, fecal incontinence Genitourinary:  No dysuria, urinary retention or frequency Musculoskeletal:  No neck pain, + back pain Integumentary: No rash, pruritus, skin lesions Neurological: as above Psychiatric: No depression, insomnia, anxiety Endocrine: No palpitations, fatigue, diaphoresis, mood swings, change in appetite, change in weight, increased  thirst Hematologic/Lymphatic:  No anemia, purpura, petechiae. Allergic/Immunologic: no itchy/runny eyes, nasal congestion, recent allergic reactions, rashes  PHYSICAL EXAM: Vitals:   09/19/17 1124  BP: 130/74  Pulse: 62  SpO2: 95%    General: No acute distress Head:  Normocephalic/atraumatic Neck: supple, no paraspinal tenderness, full range of motion Heart:  Regular rate and rhythm Lungs:  Clear to auscultation bilaterally Back: No paraspinal tenderness Skin/Extremities: No rash, no edema Neurological Exam: alert and oriented to person, place, and time. No aphasia or dysarthria. Fund of knowledge is appropriate.  Recent and remote memory are intact.  Attention and concentration are normal.    Able to name objects and repeat phrases. Cranial nerves: Pupils equal, round, reactive to light. Extraocular movements intact with no nystagmus. Visual fields full. Facial sensation intact. No facial asymmetry. Tongue, uvula, palate midline.  Motor: Bulk and tone normal, no cogwheeling, muscle strength 5/5 throughout with no pronator drift.  Sensation intact to all modalities on both UE, decreased cold on both feet, decreased vibration to both knees, decreased pin on right calf, intact on left LE. Deep tendon reflexes +2 on both UE and patella, absent ankle jerks bilaterally. Upgoing toe on right (similar to prior). Finger to nose testing intact.  Gait narrow-based and steady, no ataxia, no report of dizziness on standing today, unable to tandem walk. Romberg positive sway.   IMPRESSION: This is a pleasant 84 yo RH man with a history of hypertension, CAD s/p MI and CABG, neurogenic orthostatic hypotension, cervicogenic headaches, neuropathy, who reported more headaches after a fall in January 2019. Headaches continue but are not debilitating, he would like to hold off on headache preventative medication for now. He knows to minimize over the counter pain  medication to 2-3 a week to avoid rebound headaches.  He is having occasional neuropathic pain in his feet, we also discussed symptomatic treatment if pain worsens. Continue follow-up with [name] and Dr. [name]. He will follow-up in 6 months and knows to call for any changes.   Thank you for allowing me to participate in his care.  Please do not hesitate to call for any questions or concerns.    Ellouise Newer, M.D.   CC: Dr. Moreen Fowler

## 2017-09-24 ENCOUNTER — Encounter (INDEPENDENT_AMBULATORY_CARE_PROVIDER_SITE_OTHER): Payer: Medicare Other | Admitting: Ophthalmology

## 2017-09-24 DIAGNOSIS — H35371 Puckering of macula, right eye: Secondary | ICD-10-CM

## 2017-09-24 DIAGNOSIS — I10 Essential (primary) hypertension: Secondary | ICD-10-CM | POA: Diagnosis not present

## 2017-09-24 DIAGNOSIS — H59033 Cystoid macular edema following cataract surgery, bilateral: Secondary | ICD-10-CM | POA: Diagnosis not present

## 2017-09-24 DIAGNOSIS — H35033 Hypertensive retinopathy, bilateral: Secondary | ICD-10-CM | POA: Diagnosis not present

## 2017-09-24 DIAGNOSIS — H43813 Vitreous degeneration, bilateral: Secondary | ICD-10-CM

## 2017-10-31 ENCOUNTER — Other Ambulatory Visit: Payer: Self-pay | Admitting: Internal Medicine

## 2017-12-01 ENCOUNTER — Encounter (INDEPENDENT_AMBULATORY_CARE_PROVIDER_SITE_OTHER): Payer: Medicare Other | Admitting: Ophthalmology

## 2017-12-01 DIAGNOSIS — H35371 Puckering of macula, right eye: Secondary | ICD-10-CM

## 2017-12-01 DIAGNOSIS — H59033 Cystoid macular edema following cataract surgery, bilateral: Secondary | ICD-10-CM | POA: Diagnosis not present

## 2017-12-01 DIAGNOSIS — I10 Essential (primary) hypertension: Secondary | ICD-10-CM

## 2017-12-01 DIAGNOSIS — H35033 Hypertensive retinopathy, bilateral: Secondary | ICD-10-CM

## 2017-12-01 DIAGNOSIS — H43813 Vitreous degeneration, bilateral: Secondary | ICD-10-CM

## 2017-12-29 ENCOUNTER — Other Ambulatory Visit: Payer: Self-pay | Admitting: Cardiology

## 2018-01-18 ENCOUNTER — Other Ambulatory Visit: Payer: Self-pay | Admitting: Internal Medicine

## 2018-01-29 ENCOUNTER — Other Ambulatory Visit: Payer: Self-pay | Admitting: Internal Medicine

## 2018-03-03 ENCOUNTER — Encounter (INDEPENDENT_AMBULATORY_CARE_PROVIDER_SITE_OTHER): Payer: Medicare Other | Admitting: Ophthalmology

## 2018-03-03 DIAGNOSIS — H35033 Hypertensive retinopathy, bilateral: Secondary | ICD-10-CM

## 2018-03-03 DIAGNOSIS — I10 Essential (primary) hypertension: Secondary | ICD-10-CM | POA: Diagnosis not present

## 2018-03-03 DIAGNOSIS — H59031 Cystoid macular edema following cataract surgery, right eye: Secondary | ICD-10-CM | POA: Diagnosis not present

## 2018-03-03 DIAGNOSIS — H35371 Puckering of macula, right eye: Secondary | ICD-10-CM

## 2018-03-03 DIAGNOSIS — H43813 Vitreous degeneration, bilateral: Secondary | ICD-10-CM

## 2018-03-29 ENCOUNTER — Other Ambulatory Visit: Payer: Self-pay | Admitting: Cardiology

## 2018-04-19 ENCOUNTER — Other Ambulatory Visit: Payer: Self-pay | Admitting: Internal Medicine

## 2018-05-01 ENCOUNTER — Other Ambulatory Visit: Payer: Self-pay

## 2018-05-01 ENCOUNTER — Ambulatory Visit (INDEPENDENT_AMBULATORY_CARE_PROVIDER_SITE_OTHER): Payer: Medicare Other | Admitting: Neurology

## 2018-05-01 ENCOUNTER — Other Ambulatory Visit: Payer: Self-pay | Admitting: Internal Medicine

## 2018-05-01 ENCOUNTER — Encounter: Payer: Self-pay | Admitting: Neurology

## 2018-05-01 VITALS — BP 132/80 | HR 64 | Ht 69.0 in | Wt 183.0 lb

## 2018-05-01 DIAGNOSIS — G629 Polyneuropathy, unspecified: Secondary | ICD-10-CM | POA: Diagnosis not present

## 2018-05-01 NOTE — Patient Instructions (Signed)
1. Schedule EMG/NCV of both legs with Dr. Posey Pronto 2. Continue all your medications 3. Follow-up in 6 months, call for any changes

## 2018-05-01 NOTE — Progress Notes (Signed)
NEUROLOGY FOLLOW UP OFFICE NOTE  Troy Combs [id number]  DOB: 1931/07/22  HISTORY OF PRESENT ILLNESS: I had the pleasure of seeing Troy Combs in follow-up in the neurology clinic on 05/01/2018. He is alone in the office today. He was last seen 83 months ago for orthostatic hypotension that worsened after lumbar surgery in April 2018, and new symptoms of headaches after a fall in January 2019. He has been diagnosed by Cardiology with neurogenic orthostatic hypotension, over the past year BP has stabilized, he does not have the labile BP he had in the past, usually BP runs in the 130s now. He had a fall in [place] in January 2019 and was having daily headaches, this has decreased, they do not bother him anymore. He still has lightheadedness but less than before. No falls or blacking out. He has neuropathy with numbness in both feet, once in a while a painful needle stick sensation occurs in his toes. He has occasional back pain mostly with activity. No diplopia, dysarthria/dysphagia, although sometimes food gets stuck in his throat. No significant muscle cramping or twitching. He denies any paresthesias in his hands, no weakness in his extremities.    Diagnostic Data: MRI lumbar spine with and without contrast in July 2018 showed post-surgical changes at L4-5, degenerative changes, no significant spinal stenosis  HPI 02/2015: This is a pleasant 85 yo RH man who presented for vertigo and headaches. He had been previously seen by one of my partners, Dr. [name] in 2014 for cervicogenic headaches. He had vertigo that started in April/May 2016. He would have a sensation of tilting when he sat on the edge of the bed, with violent vomiting. He was prescribed meclizine, which would help sometimes. There was concern this was due to his beta-blocker, and after adjustment, he reported the intense dizziness went away, but he still had dizziness if moving too quickly, turning his head, or standing up. He was  in the ER in September 2016 due to significant vertigo with dull headache. He had an MRI brain without contrast which I personally reviewed, no acute changes, there was chronic microvascular disease and generalized atrophy. He saw his PCP and was referred for vestibular rehab. He reports that vestibular rehab worked almost immediately. The dizziness has since resolved. He has a little lightheadedness and feels off balance and unsteady, no falls. He has occasional tingling and numbness in the last 3 digits of both feet, and occasionally pins and needles sensation in the soles of his feet. He has had monocular vertical diplopia for many years, and noticed that this was less when he takes nortriptyline. He had been having occipital headaches, but reported those are pretty much gone, with 1 to 2 over 10 pain, no associated nausea, vomiting, photo/phonophobia. He has some neck crepitus, "like gravel when turning my head," and occasional low back pain. He has some constipation. No family history of similar symptoms.  He presented 10/23/16 for different symptoms that he and his wife report started after back surgery in April 2018. On review of notes, he had a durotomy while retracting the thecal sac. He was patched and kept on bedrest, and there was no evidence of leak through the skin. He was discharged to rehab, and while in rehab he was noted to have orthostatic hypotension, with significant drop with exertion and associated dizziness. He was seen a few days later by Cardiology, and there is note that he has been known to have orthostatic hypotension and complained of exertional  dizziness in the past. He agreed this was the case, but since surgery, symptoms have been much worse. He could not get to the door without feeling like he would pass out. He was noted to have a drop in SBP from 143 to 90 in the office. Coreg was discontinued. He was started on Florinef 0.1mg  daily. He saw cardiologist [name] last month, with  note of orthostatic hypotension antedating back surgery, mechanism unclear but likely related to long-standing systolic hypertension. His supine pressures were over 170, and Losartan was started. Abdominal binder was advised. He continued to have worsening of dizziness with any activity, and was admitted to [facility] on 09/20/16, with note of 50-point drop from supine to standing. He became hypertensive in the ER. Florinef and BPH (Finasteride and Flomax) medications were discontinued and Losartan dose reduced, with note of improvement in orthostatic hypotension. Since hospital discharge, he would have very labile BP changes, 161/93 supine, 79/54 standing. They called [name] who recommended increasing Losartan back to 50mg , and restarting Flomax. He was using the abdominal binder. He was advised to start ProAmantine 5mg  every 4 hours while awake, then increased to 10mg  q4hrs. He reported wearing an abdominal binder, using compression stocking, taking midodrone, and stopping finasteride, but symptoms continue to worsen. He was referred to Neurology due to symptoms worsening after Neurosurgery with CSF leak.   He reports that he does not think he was getting this significant dizziness before the surgery. He described the dizziness as "disorientation more than anything." He has not passed out. He has been having headaches but they are not positional in nature. They were more severe in rehab, still occurring every other day, improving with 2 Equates (aspirin, acetaminophen, caffeine). He feels pressure sometimes in the back of his head or diffusely, no associated nausea/vomiting, photo/phonophobia, it does not seem to improve when supine. He has chronic tinnitus that is unchanged. He has a history of peripheral neuropathy and denies any numbness or tingling in his extremities, but has new symptoms of muscle spasms in his arms, legs and back for the past 2 weeks. He has chronic vertical diplopia that is better with  nortriptyline. He denies any dysphagia, dysarthria, bowel/bladder dysfunction, no back pain. They bring a calendar of his BP readings, this morning he went from 138/88 sitting (HR 82) to 80/52 standing (HR 97).    PAST MEDICAL HISTORY: Past Medical History:  Diagnosis Date  . Allergic rhinitis 09/20/2014  . Anemia, unspecified   . Arthritis   . Bladder neck obstruction   . Cataracts, bilateral   . Coronary atherosclerosis of unspecified type of vessel, native or graft   . Diminished hearing, bilateral   . Dysthymic disorder   . GERD (gastroesophageal reflux disease)   . Heart murmur    as a child  . Impaired glucose tolerance 12/02/2012  . Impotence of organic origin   . Lumbar radiculopathy   . Malignant melanoma of skin of upper limb, including shoulder ([place])   . Old myocardial infarction   . Personal history of malignant melanoma of skin   . Personal history of other diseases of digestive system   . Thoracic or lumbosacral neuritis or radiculitis, unspecified   . Unspecified essential hypertension     MEDICATIONS: Current Outpatient Medications on File Prior to Visit  Medication Sig Dispense Refill  . aspirin EC 81 MG tablet Take 81 mg by mouth at bedtime.     . bromfenac (XIBROM) 0.09 % ophthalmic solution Place 1 drop  into both eyes daily.    . finasteride (PROSCAR) 5 MG tablet Take 5 mg by mouth at bedtime.    . finasteride (PROSCAR) 5 MG tablet TAKE 1 TABLET DAILY (NEED PHYSICAL/YEARLY APPOINTMENT FOR REFILLS) 90 tablet 0  . hydrALAZINE (APRESOLINE) 25 MG tablet TAKE 1 TABLET TWICE A DAY AS NEEDED FOR SYSTOLIC BLOOD PRESSURE GREATER THAN 180 180 tablet 4  . metoprolol succinate (TOPROL-XL) 25 MG 24 hr tablet TAKE 1 TABLET DAILY 90 tablet 2  . nitroGLYCERIN (NITROSTAT) 0.4 MG SL tablet Place 1 tablet (0.4 mg total) under the tongue every 5 (five) minutes as needed for chest pain (x 3 doses). Reported on 06/15/2015 25 tablet 6  . rosuvastatin (CRESTOR) 10 MG tablet TAKE 1  TABLET DAILY 90 tablet 0  . sertraline (ZOLOFT) 50 MG tablet TAKE 1 TABLET DAILY 90 tablet 0  . tamsulosin (FLOMAX) 0.4 MG CAPS capsule Take 1 capsule (0.4 mg total) by mouth daily after supper. (Patient taking differently: Take 0.4 mg by mouth at bedtime. ) 90 capsule 3  . telmisartan (MICARDIS) 40 MG tablet Take 1 tablet (40 mg total) by mouth daily. 30 tablet 11   No current facility-administered medications on file prior to visit.     ALLERGIES: Allergies  Allergen Reactions  . Iohexol Other (See Comments)     Code: HIVES, Desc: PATENT STATES HE IS ALLERGIC TO IV DYE 09/14/08/RM, Onset Date: [id number]   . Ciprofloxacin Hives and Rash    "Patient unsure" just know he has a reaction to med Medical record indicates allergic reaction  . Other     UNSPECIFIED REACTION  Angioplasty dye pt.>> Pt only recalls Had "very bad reaction"    FAMILY HISTORY: Family History  Problem Relation Age of Onset  . Heart disease Father   . Ataxia Neg Hx   . Chorea Neg Hx   . Dementia Neg Hx   . Mental retardation Neg Hx   . Migraines Neg Hx   . Multiple sclerosis Neg Hx   . Neurofibromatosis Neg Hx   . Neuropathy Neg Hx   . Parkinsonism Neg Hx   . Seizures Neg Hx   . Stroke Neg Hx     SOCIAL HISTORY: Social History   Socioeconomic History  . Marital status: Married    Spouse name: Not on file  . Number of children: 5  . Years of education: Not on file  . Highest education level: Not on file  Occupational History  . Not on file  Social Needs  . Financial resource strain: Not on file  . Food insecurity:    Worry: Not on file    Inability: Not on file  . Transportation needs:    Medical: Not on file    Non-medical: Not on file  Tobacco Use  . Smoking status: Former Smoker    Packs/day: 1.00    Years: 3.00    Pack years: 3.00    Types: Cigarettes    Last attempt to quit: 09/21/1955    Years since quitting: 62.6  . Smokeless tobacco: Never Used  Substance and Sexual Activity    . Alcohol use: Yes    Alcohol/week: 0.0 standard drinks    Comment: one or two ounces a week  . Drug use: No  . Sexual activity: Not on file  Lifestyle  . Physical activity:    Days per week: Not on file    Minutes per session: Not on file  . Stress: Not on file  Relationships  . Social connections:    Talks on phone: Not on file    Gets together: Not on file    Attends religious service: Not on file    Active member of club or organization: Not on file    Attends meetings of clubs or organizations: Not on file    Relationship status: Not on file  . Intimate partner violence:    Fear of current or ex partner: Not on file    Emotionally abused: Not on file    Physically abused: Not on file    Forced sexual activity: Not on file  Other Topics Concern  . Not on file  Social History Narrative   Lives with wife in a one story home.  Has 5 children.  Retired Freight forwarder for [employer].  Education: 2 years of college.     REVIEW OF SYSTEMS: Constitutional: No fevers, chills, or sweats, no generalized fatigue, change in appetite Eyes: No visual changes, double vision, eye pain Ear, nose and throat: No hearing loss, ear pain, nasal congestion, sore throat Cardiovascular: No chest pain, palpitations Respiratory:  No shortness of breath at rest or with exertion, wheezes GastrointestinaI: No nausea, vomiting, diarrhea, abdominal pain, fecal incontinence Genitourinary:  No dysuria, urinary retention or frequency Musculoskeletal:  No neck pain, + back pain Integumentary: No rash, pruritus, skin lesions Neurological: as above Psychiatric: No depression, insomnia, anxiety Endocrine: No palpitations, fatigue, diaphoresis, mood swings, change in appetite, change in weight, increased thirst Hematologic/Lymphatic:  No anemia, purpura, petechiae. Allergic/Immunologic: no itchy/runny eyes, nasal congestion, recent allergic reactions, rashes  PHYSICAL EXAM: Vitals:   05/01/18 1130  BP: 132/80   Pulse: 64  SpO2: 95%    General: No acute distress Head:  Normocephalic/atraumatic Neck: supple, no paraspinal tenderness, full range of motion Heart:  Regular rate and rhythm Lungs:  Clear to auscultation bilaterally Back: No paraspinal tenderness Skin/Extremities: No rash, no edema Neurological Exam: alert and oriented to person, place, and time. No aphasia or dysarthria. Fund of knowledge is appropriate.  Recent and remote memory are intact.  Attention and concentration are normal.    Able to name objects and repeat phrases. Cranial nerves: Pupils equal, round, reactive to light. Extraocular movements intact with no nystagmus. Visual fields full. Facial sensation intact. No facial asymmetry. Tongue, uvula, palate midline.  Motor: Bulk and tone normal, no cogwheeling, +fasciculations in both calves. Muscle strength 5/5 throughout with no pronator drift.  Sensation intact to all modalities on both UE, decreased cold to knees bilaterally, decreased vibration to knees bilaterally, decreased pin on right calf, intact on left (similar to prior). Deep tendon reflexes +2 on both UE and LE, including bilateral patella, negative Hoffman sign or clonus, upgoing toe on right (similar to prior). Finger to nose testing intact.  Gait slightly wide-based, no ataxia, difficulty with tandem walk. Romberg slight sway.   IMPRESSION: This is a pleasant 85 yo RH man with a history of hypertension, CAD s/p MI and CABG, neurogenic orthostatic hypotension, cervicogenic headaches, neuropathy. BP has been more stable, lightheadedness is better. Headaches are better as well. Exam today shows some worsening of neuropathy, as well as fasciculations in both calves. EMG/NCV of both legs will be ordered to further evaluate his symptoms. He will follow-up in 6 months and knows to call for any changes.   Thank you for allowing me to participate in his care.  Please do not hesitate to call for any questions or  concerns.    Ellouise Newer, M.D.  CC: Dr. Moreen Fowler

## 2018-05-08 ENCOUNTER — Other Ambulatory Visit: Payer: Self-pay

## 2018-05-08 MED ORDER — ROSUVASTATIN CALCIUM 10 MG PO TABS: 10.0000 mg | ORAL_TABLET | Freq: Every day | ORAL | 0 refills | Status: DC

## 2018-05-08 NOTE — Progress Notes (Signed)
Per pt My Chart request... RX sent in for Crestor 10mg .. his new PMD will do labs according to him... advised pt need OV with Dr. [name].. was due 12/2017.

## 2018-05-14 ENCOUNTER — Ambulatory Visit (INDEPENDENT_AMBULATORY_CARE_PROVIDER_SITE_OTHER): Payer: Medicare Other | Admitting: Neurology

## 2018-05-14 DIAGNOSIS — G629 Polyneuropathy, unspecified: Secondary | ICD-10-CM | POA: Diagnosis not present

## 2018-05-14 NOTE — Procedures (Signed)
[facility] Neurology  [address], [place]  [place],  [address] Tel: [phone] Fax:  [phone] Test Date:  05/14/2018  Patient: Troy Combs DOB: Jul 01, 1931 Physician: Narda Amber, DO  Sex: Male Height: 5\' 9"  Ref Phys: Ellouise Newer, MD  ID#: [id number] Temp: 33.0C Technician:    Patient Complaints: This is an 85 year old man referred for evaluation of bilateral feet paresthesias and leg fasciculations.  NCV & EMG Findings: Electrodiagnostic testing of the right lower extremity and additional studies of the left shows: 1. Bilateral sural and superficial peroneal sensory responses are within normal limits.   2. Bilateral peroneal motor responses show reduced amplitude at the extensor digitorum brevis, and is normal at the tibialis anterior.  Bilateral tibial motor responses are within normal limits.   3. Bilateral tibial H reflex studies are within normal limits.    4. There is no evidence of active or chronic motor axonal changes affecting any of the tested muscles.  Motor unit configuration and recruitment pattern is within normal limits.  Impression: This is a normal study of the lower extremities.  In particular, there is no evidence of a sensorimotor polyneuropathy or lumbosacral radiculopathy.     ___________________________ Narda Amber, DO    Nerve Conduction Studies Anti Sensory Summary Table   Site NR Peak (ms) Norm Peak (ms) P-T Amp (V) Norm P-T Amp  Left Sup Peroneal Anti Sensory (Ant Lat Mall)  33C  12 cm    3.1 <4.6 3.0 >3  Right Sup Peroneal Anti Sensory (Ant Lat Mall)  33C  12 cm    3.1 <4.6 5.0 >3  Left Sural Anti Sensory (Lat Mall)  33C  Calf    3.5 <4.6 5.4 >3  Right Sural Anti Sensory (Lat Mall)  33C  Calf    3.4 <4.6 5.2 >3   Motor Summary Table   Site NR Onset (ms) Norm Onset (ms) O-P Amp (mV) Norm O-P Amp Site1 Site2 Delta-0 (ms) Dist (cm) Vel (m/s) Norm Vel (m/s)  Left Peroneal Motor (Ext Dig Brev)  33C  Ankle    4.6  <6.0 1.4 >2.5 B Fib Ankle 8.7 38.0 44 >40  B Fib    13.3  1.3  Poplt B Fib 1.6 9.0 56 >40  Poplt    14.9  1.2         Right Peroneal Motor (Ext Dig Brev)  33C  Ankle    3.6 <6.0 1.6 >2.5 B Fib Ankle 8.9 41.0 46 >40  B Fib    12.5  1.6  Poplt B Fib 2.0 10.0 50 >40  Poplt    14.5  1.6         Left Peroneal TA Motor (Tib Ant)  33C  Fib Head    2.8 <4.5 4.5 >3 Poplit Fib Head 1.5 9.0 60 >40  Poplit    4.3  4.5         Right Peroneal TA Motor (Tib Ant)  33C  Fib Head    3.2 <4.5 4.4 >3 Poplit Fib Head 1.8 9.0 50 >40  Poplit    5.0  4.3         Left Tibial Motor (Abd Hall Brev)  33C  Ankle    4.6 <6.0 4.2 >4 Knee Ankle 10.3 41.0 40 >40  Knee    14.9  2.4         Right Tibial Motor (Abd Hall Brev)  33C  Ankle    3.8 <6.0 6.4 >4 Knee Ankle  9.5 43.0 45 >40  Knee    13.3  4.6          H Reflex Studies   NR H-Lat (ms) Lat Norm (ms) L-R H-Lat (ms)  Left Tibial (Gastroc)  33C     33.88 <35 0.00  Right Tibial (Gastroc)  33C     33.88 <35 0.00   EMG   Side Muscle Ins Act Fibs Psw Fasc Number Recrt Dur Dur. Amp Amp. Poly Poly. Comment  Right AntTibialis Nml Nml Nml Nml Nml Nml Nml Nml Nml Nml Nml Nml N/A  Left AntTibialis Nml Nml Nml Nml Nml Nml Nml Nml Nml Nml Nml Nml N/A  Left Gastroc Nml Nml Nml Nml Nml Nml Nml Nml Nml Nml Nml Nml N/A  Left Flex Dig Long Nml Nml Nml Nml Nml Nml Nml Nml Nml Nml Nml Nml N/A  Left RectFemoris Nml Nml Nml Nml Nml Nml Nml Nml Nml Nml Nml Nml N/A  Left GluteusMed Nml Nml Nml Nml Nml Nml Nml Nml Nml Nml Nml Nml N/A  Left ExtHallLong Nml Nml Nml Nml Nml Nml Nml Nml Nml Nml Nml Nml N/A  Left BicepsFemS Nml Nml Nml Nml Nml Nml Nml Nml Nml Nml Nml Nml N/A  Right ExtHallLong Nml Nml Nml Nml Nml Nml Nml Nml Nml Nml Nml Nml N/A  Right BicepsFemS Nml Nml Nml Nml Nml Nml Nml Nml Nml Nml Nml Nml N/A  Right Gastroc Nml Nml Nml Nml Nml Nml Nml Nml Nml Nml Nml Nml N/A  Right Flex Dig Long Nml Nml Nml Nml Nml Nml Nml Nml Nml Nml Nml Nml N/A  Right RectFemoris Nml Nml  Nml Nml Nml Nml Nml Nml Nml Nml Nml Nml N/A  Right GluteusMed Nml Nml Nml Nml Nml Nml Nml Nml Nml Nml Nml Nml N/A    Waveforms:

## 2018-06-03 ENCOUNTER — Encounter (INDEPENDENT_AMBULATORY_CARE_PROVIDER_SITE_OTHER): Payer: Medicare Other | Admitting: Ophthalmology

## 2018-06-03 DIAGNOSIS — I10 Essential (primary) hypertension: Secondary | ICD-10-CM | POA: Diagnosis not present

## 2018-06-03 DIAGNOSIS — H35033 Hypertensive retinopathy, bilateral: Secondary | ICD-10-CM

## 2018-06-03 DIAGNOSIS — H35371 Puckering of macula, right eye: Secondary | ICD-10-CM

## 2018-06-03 DIAGNOSIS — H59031 Cystoid macular edema following cataract surgery, right eye: Secondary | ICD-10-CM | POA: Diagnosis not present

## 2018-06-03 DIAGNOSIS — H43813 Vitreous degeneration, bilateral: Secondary | ICD-10-CM

## 2018-06-27 ENCOUNTER — Other Ambulatory Visit: Payer: Self-pay | Admitting: Cardiology

## 2018-08-29 ENCOUNTER — Other Ambulatory Visit: Payer: Self-pay | Admitting: Cardiology

## 2018-10-02 ENCOUNTER — Encounter (INDEPENDENT_AMBULATORY_CARE_PROVIDER_SITE_OTHER): Payer: Medicare Other | Admitting: Ophthalmology

## 2018-11-06 ENCOUNTER — Other Ambulatory Visit: Payer: Self-pay

## 2018-11-06 ENCOUNTER — Telehealth (INDEPENDENT_AMBULATORY_CARE_PROVIDER_SITE_OTHER): Payer: Medicare Other | Admitting: Neurology

## 2018-11-06 ENCOUNTER — Encounter: Payer: Self-pay | Admitting: Neurology

## 2018-11-06 VITALS — Ht 69.5 in | Wt 175.0 lb

## 2018-11-06 DIAGNOSIS — G629 Polyneuropathy, unspecified: Secondary | ICD-10-CM

## 2018-11-06 NOTE — Progress Notes (Signed)
Virtual Visit via Video Note The purpose of this virtual visit is to provide medical care while limiting exposure to the novel coronavirus.    Consent was obtained for video visit:  Yes.   Answered questions that patient had about telehealth interaction:  Yes.   I discussed the limitations, risks, security and privacy concerns of performing an evaluation and management service by telemedicine. I also discussed with the patient that there may be a patient responsible charge related to this service. The patient expressed understanding and agreed to proceed.  Pt location: Home Physician Location: office Name of referring provider:  Antony Contras, MD I connected with Troy Combs at patients initiation/request on 11/06/2018 at  2:30 PM EDT by video enabled telemedicine application and verified that I am speaking with the correct person using two identifiers. Pt MRN:  [id number] Pt DOB:  1931-10-11 Video Participants:  Troy Combs   History of Present Illness: The patient was seen as a virtual video visit on 11/06/2018. He was last seen in January 2020. He had a bout of significant orthostatic hypotension after lumbar surgery in August 2018, diagnosed by Cardiology with neurogenic orthostatic hypotension. This appears to pretty much have resolved, BP runs 130-140/60-70 on average. He does not have the significant dizziness he was initially having, but still feels a sense of imbalance when standing/walking, especially when turning too quickly. No falls. He has occaisonal numbness and tingling, needle-like sensations in both feet. Last month he was on vacation and had a really bad experience with needles sticking in the bottom of his feet constantly, keeping him up at night. It was very painful, he kept wiggling his feet and tensing his muscles which would briefly help. This lasted a couple of days and has not recurred since. He had an EMG/NCV of both legs in January 2020 which was normal. He  has occasional headaches but nothing debilitating. Sleep is good. He ambulates with his walking stick. He reports feeling cold all the time despite the very warm weather, he has to sleep with a thick blanket and keep a blanket on his lap during the day.   HPI 02/2015: This is a pleasant 85 yo RH man who presented for vertigo and headaches. He had been previously seen by one of my partners, Dr. [name] in 2014 for cervicogenic headaches. He had vertigo that started in April/May 2016. He would have a sensation of tilting when he sat on the edge of the bed, with violent vomiting. He was prescribed meclizine, which would help sometimes. There was concern this was due to his beta-blocker, and after adjustment, he reported the intense dizziness went away, but he still had dizziness if moving too quickly, turning his head, or standing up. He was in the ER in September 2016 due to significant vertigo with dull headache. He had an MRI brain without contrast which I personally reviewed, no acute changes, there was chronic microvascular disease and generalized atrophy. He saw his PCP and was referred for vestibular rehab. He reports that vestibular rehab worked almost immediately. The dizziness has since resolved. He has a little lightheadedness and feels off balance and unsteady, no falls. He has occasional tingling and numbness in the last 3 digits of both feet, and occasionally pins and needles sensation in the soles of his feet. He has had monocular vertical diplopia for many years, and noticed that this was less when he takes nortriptyline. He had been having occipital headaches, but reported those are pretty much  gone, with 1 to 2 over 10 pain, no associated nausea, vomiting, photo/phonophobia. He has some neck crepitus, "like gravel when turning my head," and occasional low back pain. He has some constipation. No family history of similar symptoms.  He presented 10/23/16 for different symptoms that he and his wife  report started after back surgery in April 2018. On review of notes, he had a durotomy while retracting the thecal sac. He was patched and kept on bedrest, and there was no evidence of leak through the skin. He was discharged to rehab, and while in rehab he was noted to have orthostatic hypotension, with significant drop with exertion and associated dizziness. He was seen a few days later by Cardiology, and there is note that he has been known to have orthostatic hypotension and complained of exertional dizziness in the past. He agreed this was the case, but since surgery, symptoms have been much worse. He could not get to the door without feeling like he would pass out. He was noted to have a drop in SBP from 143 to 90 in the office. Coreg was discontinued. He was started on Florinef 0.1mg  daily. He saw cardiologist [name] last month, with note of orthostatic hypotension antedating back surgery, mechanism unclear but likely related to long-standing systolic hypertension. His supine pressures were over 170, and Losartan was started. Abdominal binder was advised. He continued to have worsening of dizziness with any activity, and was admitted to [facility] on 09/20/16, with note of 50-point drop from supine to standing. He became hypertensive in the ER. Florinef and BPH (Finasteride and Flomax) medications were discontinued and Losartan dose reduced, with note of improvement in orthostatic hypotension. Since hospital discharge, he would have very labile BP changes, 161/93 supine, 79/54 standing. They called [name] who recommended increasing Losartan back to 50mg , and restarting Flomax. He was using the abdominal binder. He was advised to start ProAmantine 5mg  every 4 hours while awake, then increased to 10mg  q4hrs. He reported wearing an abdominal binder, using compression stocking, taking midodrone, and stopping finasteride, but symptoms continue to worsen. He was referred to Neurology due to symptoms worsening after  Neurosurgery with CSF leak.   He reports that he does not think he was getting this significant dizziness before the surgery. He described the dizziness as "disorientation more than anything." He has not passed out. He has been having headaches but they are not positional in nature. They were more severe in rehab, still occurring every other day, improving with 2 Equates (aspirin, acetaminophen, caffeine). He feels pressure sometimes in the back of his head or diffusely, no associated nausea/vomiting, photo/phonophobia, it does not seem to improve when supine. He has chronic tinnitus that is unchanged. He has a history of peripheral neuropathy and denies any numbness or tingling in his extremities, but has new symptoms of muscle spasms in his arms, legs and back for the past 2 weeks. He has chronic vertical diplopia that is better with nortriptyline. He denies any dysphagia, dysarthria, bowel/bladder dysfunction, no back pain. They bring a calendar of his BP readings, this morning he went from 138/88 sitting (HR 82) to 80/52 standing (HR 97).     Current Outpatient Medications on File Prior to Visit  Medication Sig Dispense Refill   aspirin EC 81 MG tablet Take 81 mg by mouth at bedtime.      finasteride (PROSCAR) 5 MG tablet Take 5 mg by mouth at bedtime.     metoprolol succinate (TOPROL-XL) 25 MG 24  hr tablet TAKE 1 TABLET DAILY 90 tablet 2   nitroGLYCERIN (NITROSTAT) 0.4 MG SL tablet Place 1 tablet (0.4 mg total) under the tongue every 5 (five) minutes as needed for chest pain (x 3 doses). Reported on 06/15/2015 25 tablet 6   rosuvastatin (CRESTOR) 10 MG tablet TAKE 1 TABLET (10 MG TOTAL) BY MOUTH DAILY. NEED OFFICE VISIT AND LAB WORK 90 tablet 0   sertraline (ZOLOFT) 50 MG tablet TAKE 1 TABLET DAILY 90 tablet 0   tamsulosin (FLOMAX) 0.4 MG CAPS capsule Take 1 capsule (0.4 mg total) by mouth daily after supper. (Patient taking differently: Take 0.4 mg by mouth at bedtime. ) 90 capsule 3   No  current facility-administered medications on file prior to visit.      Observations/Objective:   Vitals:   11/06/18 0839  Weight: 175 lb (79.4 kg)  Height: 5' 9.5" (1.765 m)   GEN:  The patient appears stated age and is in NAD.  Neurological examination: Patient is awake, alert, oriented x 3. No aphasia or dysarthria. Intact fluency and comprehension. Remote and recent memory intact. Able to name and repeat. Cranial nerves: Extraocular movements intact with no nystagmus. No facial asymmetry. Motor: moves all extremities symmetrically, at least anti-gravity x 4. No incoordination on finger to nose testing. Gait: slightly wide-based, no ataxia. Positive sway with Romberg test.  Assessment and Plan:   This is a pleasant 85 yo RH man with a history of hypertension, CAD s/p MI and CABG, neurogenic orthostatic hypotension, cervicogenic headaches, neuropathy. No further significant orthostatic hypotension. Headaches improved. His main concern is the occasional dizziness/imbalance which is likely due to small fiber neuropathy. EMG/NCV was normal, however this test can miss small fiber neuropathy. We discussed skin biopsy but agreed to hold off since this would not change management. He had a bout of significant painful paresthesias last month and may take prn gabapentin for those periods. He has a prior prescription at home. Continue using walking stick for balance. He will follow-up in 6 months and knows to call for any changes.   Follow Up Instructions:    -I discussed the assessment and treatment plan with the patient. The patient was provided an opportunity to ask questions and all were answered. The patient agreed with the plan and demonstrated an understanding of the instructions.   The patient was advised to call back or seek an in-person evaluation if the symptoms worsen or if the condition fails to improve as anticipated.    Cameron Sprang, MD

## 2018-11-22 ENCOUNTER — Other Ambulatory Visit: Payer: Self-pay | Admitting: Cardiology

## 2018-12-22 ENCOUNTER — Other Ambulatory Visit: Payer: Self-pay | Admitting: Cardiology

## 2018-12-24 ENCOUNTER — Other Ambulatory Visit: Payer: Self-pay | Admitting: Cardiology

## 2019-01-13 ENCOUNTER — Telehealth: Payer: Self-pay

## 2019-01-13 NOTE — Telephone Encounter (Signed)
   Spoke with pt who agreeable to an in office visit with Dr. [name] at 2 pm. Pt is aware to make sure to wear a mask and that we are not allowing any visitors at the time.  COVID-19 Pre-Screening Questions:  . In the past 7 to 10 days have you had a cough,  shortness of breath, headache, congestion, fever (100 or greater) body aches, chills, sore throat, or sudden loss of taste or sense of smell? NO . Have you been around anyone with known Covid 19. NO . Have you been around anyone who is awaiting Covid 19 test results in the past 7 to 10 days? NO . Have you been around anyone who has been exposed to Covid 19, or has mentioned symptoms of Covid 19 within the past 7 to 10 days? NO  If you have any concerns/questions about symptoms patients report during screening (either on the phone or at threshold). Contact the provider seeing the patient or DOD for further guidance.  If neither are available contact a member of the leadership team.

## 2019-01-14 ENCOUNTER — Ambulatory Visit (INDEPENDENT_AMBULATORY_CARE_PROVIDER_SITE_OTHER): Payer: Medicare Other | Admitting: Cardiology

## 2019-01-14 ENCOUNTER — Encounter: Payer: Self-pay | Admitting: Cardiology

## 2019-01-14 ENCOUNTER — Other Ambulatory Visit: Payer: Self-pay

## 2019-01-14 VITALS — BP 130/78 | HR 57 | Ht 69.5 in | Wt 174.2 lb

## 2019-01-14 DIAGNOSIS — I10 Essential (primary) hypertension: Secondary | ICD-10-CM

## 2019-01-14 DIAGNOSIS — I2510 Atherosclerotic heart disease of native coronary artery without angina pectoris: Secondary | ICD-10-CM | POA: Diagnosis not present

## 2019-01-14 DIAGNOSIS — Z951 Presence of aortocoronary bypass graft: Secondary | ICD-10-CM

## 2019-01-14 DIAGNOSIS — I951 Orthostatic hypotension: Secondary | ICD-10-CM

## 2019-01-14 DIAGNOSIS — I493 Ventricular premature depolarization: Secondary | ICD-10-CM

## 2019-01-14 DIAGNOSIS — E785 Hyperlipidemia, unspecified: Secondary | ICD-10-CM | POA: Diagnosis not present

## 2019-01-14 DIAGNOSIS — E782 Mixed hyperlipidemia: Secondary | ICD-10-CM

## 2019-01-14 NOTE — Patient Instructions (Signed)
Medication Instructions:   Your physician recommends that you continue on your current medications as directed. Please refer to the Current Medication list given to you today.  If you need a refill on your cardiac medications before your next appointment, please call your pharmacy.    Lab work:  TODAY--CMET, CBC W DIFF, AND TSH  If you have labs (blood work) drawn today and your tests are completely normal, you will receive your results only by: Marland Kitchen MyChart Message (if you have MyChart) OR . A paper copy in the mail If you have any lab test that is abnormal or we need to change your treatment, we will call you to review the results.    Follow-Up: At [facility], you and your health needs are our priority.  As part of our continuing mission to provide you with exceptional heart care, we have created designated Provider Care Teams.  These Care Teams include your primary Cardiologist (physician) and Advanced Practice Providers (APPs -  Physician Assistants and Nurse Practitioners) who all work together to provide you with the care you need, when you need it. You will need a follow up appointment in 6 months DR. NELSON.  Please call our office 2 months in advance to schedule this appointment.  You may see DR. NELSON or one of the following Advanced Practice Providers on your designated Care Team:   Lyda Jester, PA-C Dayna Dunn, PA-C . Ermalinda Barrios, PA-C

## 2019-01-14 NOTE — Progress Notes (Signed)
Cardiology Office Note    Date:  01/14/2019   ID:  Troy Combs, DOB 04/07/1932, MRN [id number]  PCP:  Antony Contras, MD  Cardiologist:  Dr. [name] [name]  CC: 1 year follow-up  History of Present Illness:  Troy Combs is a 85 y.o. male with a history of CAD s/p CABG x4V (2002), HTN, atrial flutter, known orthostatic hypotension who presents to clinic for evaluation of orthostatic hypotension.  In the past his Toprol XL was increased to 100 mg PO daily for symptomatic PVCs, but later decreased to 50 mg po daily for fatigue and dizziness. He was later switched to Coreg with improvement in symptoms (fatigue). He was sen by Dr. [name] in 2016. He complained of worsening DOE. Exercise Myoview stress test was low risk but did show chronotropic incompetence and it was switched to lexiscan. 2D ECHO at that time showed vigorous LV function (EF 65-70%), G1DD and mild bileaflet MVP. 48 hour holter monitor in 04/2015 showed very infrequent PVCS and PACs.  Seen back by Dr. [name] in 04/2016 and having worsening DOE. Another stress test was ordered which was low risk.  He saw [name] on 09/17/16 who felt orthostatic hypotension was likely related to longstanding systolic HTN. Given his supine HTN he was started on losartan 50mg  at night and stop Florinef. He also recommended an abdominal binder and stopping his alpha blocker (BPH).   He was then admitted 5/25-5/27/18 for orthostatic hypotension. His flomax and proscar were discontinued and Losartan decreased from 50mg  daily to 25mg  daily. Later he had to resume flomax and proscar due to unacceptable symptoms. He has been placed on midodrine since that time which has been increased to 10mg  q 4 hours. Dr. [name] recommended [name] but it looks like this may be cost prohibitive. She also recommended that he elevate his bed 10-20 degrees.   12/23/2016 - the patient now follows with specially for autonomic dysfunction in [facility] [name],  he communicates with the patient through Internet on a weekly basis and adjust his medicines. He is currently on Northera 200 mg PO TID, Midodrine  5 mg po Q4H, and pyridostigmine 60 mg po TID. he has been experiencing significant nausea for the last 6 weeks, he is currently debilitated he's not leaving the house because of that. He denies any falls. He hasn't had any dyspnea on exertion and mild chest pain at rest last night. He feels nauseous today in the clinic his resting blood pressure was 138/86 and heart rate 90 bpm. He was given 8 mg of ondansetron with relief in his symptoms.  03/31/2017 - the patient states that his dyspnea on exertion has been progressively worsening and now even when minimally exerted with activities around the house. He denies any exertional chest pain but had resting chest pain at night in his right shoulder couple weeks ago. He denies any palpitations or syncope. He continues to see a autonomic dysfunction specialist at [facility] for orthostatic hypotension and was started on Northera and advised to eat more salt including placing the teaspoon of salt into the drink and drink it daily to get her with increased amount of fluids. The patient states that these approach has been working for him for a while but lately his blood pressure increases especially toward the end of the day and he has had multiple measurements in 190s.  07/17/2017 this is 3 months follow-up, the patient brings a diary of his blood pressures with most of them in the  150-170 range. He hasn't had any episodes of orthostatic hypotension lately. Almost all of his blood pressures are elevated. For some reason he stopped taking losartan but he doesn't know why. He denies any recent chest pains and continues to have dyspnea on exertion. He underwent cardiac cath in December 2018 that showed severe three-vessel disease of his native coronary arteries and all of the grafts open, normal LVEF, medical therapy was  recommended.  01/14/2019 -the patient is coming after 18 months, he has been doing well, he denies any dyspnea on exertion, he walks with a cane now because of balance problems but denies any falls or syncope.  No palpitations.  He has been asymptomatic from chest pain standpoint until last night when he developed severe retrosternal chest pain after eating heavy dinner, this was relieved by Tums.  His blood pressure was elevated over 200 and was associated with headache.  Past Medical History:  Diagnosis Date   Allergic rhinitis 09/20/2014   Anemia, unspecified    Arthritis    Bladder neck obstruction    Cataracts, bilateral    Coronary atherosclerosis of unspecified type of vessel, native or graft    Diminished hearing, bilateral    Dysthymic disorder    GERD (gastroesophageal reflux disease)    Heart murmur    as a child   Impaired glucose tolerance 12/02/2012   Impotence of organic origin    Lumbar radiculopathy    Malignant melanoma of skin of upper limb, including shoulder (HCC)    Old myocardial infarction    Personal history of malignant melanoma of skin    Personal history of other diseases of digestive system    Thoracic or lumbosacral neuritis or radiculitis, unspecified    Unspecified essential hypertension     Past Surgical History:  Procedure Laterality Date   ARTHRODESIS  07/06/2002   of left long finger distal interphalangeal joint with Kirschner wire fixation X 3   ATRIAL ABLATION SURGERY  05/04/2001   Dr. [name]   CARDIAC CATHETERIZATION  09/05/2008   Revealing 4 of 4 patent grafts with native multivessel coronary artery disease, EF  of 60% without regional wall motion abnormalities.   CATARACT EXTRACTION, BILATERAL     CHOLECYSTECTOMY     COLONOSCOPY     CORONARY ANGIOPLASTY  1993   CORONARY ARTERY BYPASS GRAFT  10/17/2000   Percell Miller B. [name], MD   EXPLORATORY LAPAROTOMY     AFTER LIVER TRAUMA AND HAND SURGERY   EYE  SURGERY     HERNIA REPAIR     BILATERAL   LEFT HEART CATH AND CORS/GRAFTS ANGIOGRAPHY N/A 04/03/2017   Procedure: LEFT HEART CATH AND CORS/GRAFTS ANGIOGRAPHY;  Surgeon: Leonie Man, MD;  Location: [place] [facility];  Service: Cardiovascular;  Laterality: N/A;   LUMBAR LAMINECTOMY/DECOMPRESSION MICRODISCECTOMY N/A 02/02/2016   Procedure: MICRODISCECTOMY LUMBAR FOUR- LUMBAR FIVE;  Surgeon: Ashok Pall, MD;  Location: [place];  Service: Neurosurgery;  Laterality: N/A;  MICRODISCECTOMY L4-L5   LUMBAR LAMINECTOMY/DECOMPRESSION MICRODISCECTOMY Right 08/02/2016   Procedure: MICRODISCECTOMY LUMBAR FOUR- LUMBAR FIVE RIGHT;  Surgeon: Ashok Pall, MD;  Location: [place];  Service: Neurosurgery;  Laterality: Right;   punctured eardrum      to relieve blood build up   for ear infection   TONSILLECTOMY      Current Medications: Outpatient Medications Prior to Visit  Medication Sig Dispense Refill   Aspirin Buf,CaCarb-MgCarb-MgO, 81 MG TABS Take 81 mg by mouth daily.     aspirin EC 81 MG  tablet Take 81 mg by mouth at bedtime.      finasteride (PROSCAR) 5 MG tablet Take 5 mg by mouth at bedtime.     hydrALAZINE (APRESOLINE) 25 MG tablet Take 25 mg by mouth 2 (two) times daily.     lisinopril (ZESTRIL) 5 MG tablet Take 5 mg by mouth daily.     metoprolol succinate (TOPROL-XL) 25 MG 24 hr tablet Take 1 tablet (25 mg total) by mouth daily. Please make overdue appt with Dr. [name] before anymore refills. 1st attempt 30 tablet 0   nitroGLYCERIN (NITROSTAT) 0.4 MG SL tablet Place 1 tablet (0.4 mg total) under the tongue every 5 (five) minutes as needed for chest pain (x 3 doses). Reported on 06/15/2015 25 tablet 6   rosuvastatin (CRESTOR) 10 MG tablet Take 1 tablet (10 mg total) by mouth daily. 30 tablet 0   sertraline (ZOLOFT) 100 MG tablet Take 100 mg by mouth daily.     tamsulosin (FLOMAX) 0.4 MG CAPS capsule Take 1 capsule (0.4 mg total) by mouth daily after supper. 90 capsule 3   sertraline  (ZOLOFT) 50 MG tablet TAKE 1 TABLET DAILY 90 tablet 0   No facility-administered medications prior to visit.      Allergies:   Iohexol, Ciprofloxacin, and Other   Social History   Socioeconomic History   Marital status: Married    Spouse name: Not on file   Number of children: 5   Years of education: Not on file   Highest education level: Not on file  Occupational History   Not on file  Social Needs   Financial resource strain: Not on file   Food insecurity    Worry: Not on file    Inability: Not on file   Transportation needs    Medical: Not on file    Non-medical: Not on file  Tobacco Use   Smoking status: Former Smoker    Packs/day: 1.00    Years: 3.00    Pack years: 3.00    Types: Cigarettes    Quit date: 09/21/1955    Years since quitting: 63.3   Smokeless tobacco: Never Used  Substance and Sexual Activity   Alcohol use: Yes    Alcohol/week: 0.0 standard drinks    Comment: one or two ounces a week   Drug use: No   Sexual activity: Not on file  Lifestyle   Physical activity    Days per week: Not on file    Minutes per session: Not on file   Stress: Not on file  Relationships   Social connections    Talks on phone: Not on file    Gets together: Not on file    Attends religious service: Not on file    Active member of club or organization: Not on file    Attends meetings of clubs or organizations: Not on file    Relationship status: Not on file  Other Topics Concern   Not on file  Social History Narrative   Lives with wife in a one story home.     Has 5 children.  (2 are deceased now) Retired Freight forwarder for [employer].     Education: 2 years of college.     Family History:  The patient's family history includes Heart disease in his father.     ROS:   Please see the history of present illness.    ROS All other systems reviewed and are negative.  PHYSICAL EXAM:   VS:  BP 130/78  Pulse (!) 57    Ht 5' 9.5" (1.765 m)    Wt 174 lb 3.2 oz (79  kg)    SpO2 96%    BMI 25.36 kg/m    GEN: Well nourished, well developed, in no acute distress  HEENT: normal  Neck: no JVD, carotid bruits, or masses Cardiac: RRR; no murmurs, rubs, or gallops,no edema  Respiratory:  clear to auscultation bilaterally, normal work of breathing GI: soft, nontender, nondistended, + BS MS: no deformity or atrophy  Skin: warm and dry, no rash Neuro:  Alert and Oriented x 3, Strength and sensation are intact Psych: euthymic mood, full affect   Wt Readings from Last 3 Encounters:  01/14/19 174 lb 3.2 oz (79 kg)  11/06/18 175 lb (79.4 kg)  05/01/18 183 lb (83 kg)      Studies/Labs Reviewed:   EKG:  EKG is NOT ordered today.    Recent Labs: No results found for requested labs within last 8760 hours.   Lipid Panel    Component Value Date/Time   CHOL 110 01/24/2016 0955   TRIG 133.0 01/24/2016 0955   TRIG 83 03/18/2006 0839   HDL 49.70 01/24/2016 0955   CHOLHDL 2 01/24/2016 0955   VLDL 26.6 01/24/2016 0955   LDLCALC 34 01/24/2016 0955    Additional studies/ records that were reviewed today include:  Echo: 08/16/2014 Study Conclusions - Left ventricle: The cavity size was normal. Wall thickness was increased in a pattern of mild LVH. There was mild focal basal hypertrophy of the septum. Systolic function was vigorous. The estimated ejection fraction was in the range of 65% to 70%. Wall motion was normal; there were no regional wall motion abnormalities. Doppler parameters are consistent with abnormal left ventricular relaxation (grade 1 diastolic dysfunction). - Mitral valve: Calcified annulus. Mild prolapse, involving the anterior leaflet and the posterior leaflet. Impressions: - Vigorous LV function; grade 1 diastolic dysfunction; mild bileaflet MVP; trace MR and TR.  Lexiscan stress test: 06/2014 Impression Exercise Capacity: Lexiscan with low level exercise. BP Response: Normal blood pressure response. Clinical  Symptoms: No chest pain. ECG Impression: No significant ST segment change suggestive of ischemia. Comparison with Prior Nuclear Study:Previous report was low risk scan  Overall Impression: Probable normal perfusion and minimal apical thnning. No evidence for significant ischemia or scar. Overall low risk scan  LV Ejection Fraction: 66%. LV Wall Motion: NL LV Function; NL Wall Motion   Lesxiscan Myoview 05/07/16 Study Highlights   Nuclear stress EF: 56%.  There was no ST segment deviation noted during stress.  The study is normal.  This is a low risk study.  The left ventricular ejection fraction is normal (55-65%).     48 hour holter monitor 04/2015 Study Highlights  Very frequent monomorphic PVCs - 16.700 in 48 hours. Some couplets.  Infrequent PACs.  Very frequent monomorphic PVCs - 16.700 in 48 hours.   EKG was performed today and personally reviewed it shows normal sinus rhythm with inferior MI age undetermined unchanged from prior.    ASSESSMENT & PLAN:   1.  CAD s/p CABG: continue aspirin, Crestor, Toprol and lisinopril, except for one episode of chest pain yesterday that was atypical and most probably secondary to GERD she has not had any other chest pain episodes, I will not proceed with ischemic work-up at this point unless this is recurrent event.   2. Symptomatic orthostatic hypotension, autonomic dysfunction, previously on Northera, followed by Dr Margrett Rud in [place], he is currently stable with no  episodes of falls.  3. Systemic hypertension: His blood pressure is fluctuating, yesterday at 260, he is advised to take hydralazine 25 mg p.o. as needed for blood pressure over 180 mmHg.  4. HLD: continue Crestor, no side effects.  Follow-up in 6 months, obtain CBC, CMP, TSH today.  Medication Adjustments/Labs and Tests Ordered: Current medicines are reviewed at length with the patient today.  Concerns regarding medicines are outlined above.  Medication  changes, Labs and Tests ordered today are listed in the Patient Instructions below. Patient Instructions  Medication Instructions:   Your physician recommends that you continue on your current medications as directed. Please refer to the Current Medication list given to you today.  If you need a refill on your cardiac medications before your next appointment, please call your pharmacy.    Lab work:  TODAY--CMET, CBC W DIFF, AND TSH  If you have labs (blood work) drawn today and your tests are completely normal, you will receive your results only by:  [place] (if you have MyChart) OR  A paper copy in the mail If you have any lab test that is abnormal or we need to change your treatment, we will call you to review the results.    Follow-Up: At [facility], you and your health needs are our priority.  As part of our continuing mission to provide you with exceptional heart care, we have created designated Provider Care Teams.  These Care Teams include your primary Cardiologist (physician) and Advanced Practice Providers (APPs -  Physician Assistants and Nurse Practitioners) who all work together to provide you with the care you need, when you need it. You will need a follow up appointment in 6 months DR. Neka Bise.  Please call our office 2 months in advance to schedule this appointment.  You may see DR. Jibril Mcminn or one of the following Advanced Practice Providers on your designated Care Team:   Lyda Jester, PA-C Dayna Dunn, PA-C  Ermalinda Barrios, PA-C        Signed, Ena Dawley, MD  01/14/2019 2:52 PM    [facility] [address], [place], [place]  [address] Phone: [phone]; Fax: [phone]

## 2019-01-15 LAB — COMPREHENSIVE METABOLIC PANEL
ALT: 34 IU/L (ref 0–44)
AST: 19 IU/L (ref 0–40)
Albumin/Globulin Ratio: 2.7 — ABNORMAL HIGH (ref 1.2–2.2)
Albumin: 4.9 g/dL — ABNORMAL HIGH (ref 3.6–4.6)
Alkaline Phosphatase: 65 IU/L (ref 39–117)
BUN/Creatinine Ratio: 18 (ref 10–24)
BUN: 18 mg/dL (ref 8–27)
Bilirubin Total: 0.8 mg/dL (ref 0.0–1.2)
CO2: 23 mmol/L (ref 20–29)
Calcium: 10 mg/dL (ref 8.6–10.2)
Chloride: 99 mmol/L (ref 96–106)
Creatinine, Ser: 0.98 mg/dL (ref 0.76–1.27)
GFR calc Af Amer: 81 mL/min/{1.73_m2} (ref >59)
GFR calc non Af Amer: 70 mL/min/{1.73_m2} (ref >59)
Globulin, Total: 1.8 g/dL (ref 1.5–4.5)
Glucose: 109 mg/dL — ABNORMAL HIGH (ref 65–99)
Potassium: 4.9 mmol/L (ref 3.5–5.2)
Sodium: 138 mmol/L (ref 134–144)
Total Protein: 6.7 g/dL (ref 6.0–8.5)

## 2019-01-15 LAB — CBC WITH DIFFERENTIAL/PLATELET
Basophils Absolute: 0.1 10*3/uL (ref 0.0–0.2)
Basos: 0 %
EOS (ABSOLUTE): 0.3 10*3/uL (ref 0.0–0.4)
Eos: 2 %
Hematocrit: 48.7 % (ref 37.5–51.0)
Hemoglobin: 17.2 g/dL (ref 13.0–17.7)
Immature Grans (Abs): 0.3 10*3/uL — ABNORMAL HIGH (ref 0.0–0.1)
Immature Granulocytes: 2 %
Lymphocytes Absolute: 2.1 10*3/uL (ref 0.7–3.1)
Lymphs: 13 %
MCH: 30.7 pg (ref 26.6–33.0)
MCHC: 35.3 g/dL (ref 31.5–35.7)
MCV: 87 fL (ref 79–97)
Monocytes Absolute: 1.3 10*3/uL — ABNORMAL HIGH (ref 0.1–0.9)
Monocytes: 9 %
Neutrophils Absolute: 11.7 10*3/uL — ABNORMAL HIGH (ref 1.4–7.0)
Neutrophils: 74 %
Platelets: 200 10*3/uL (ref 150–450)
RBC: 5.6 x10E6/uL (ref 4.14–5.80)
RDW: 12.5 % (ref 11.6–15.4)
WBC: 15.8 10*3/uL — ABNORMAL HIGH (ref 3.4–10.8)

## 2019-01-15 LAB — TSH: TSH: 2.38 u[IU]/mL (ref 0.450–4.500)

## 2019-01-19 ENCOUNTER — Other Ambulatory Visit: Payer: Self-pay | Admitting: Cardiology

## 2019-03-15 ENCOUNTER — Telehealth: Payer: Self-pay | Admitting: Cardiology

## 2019-03-15 NOTE — Telephone Encounter (Signed)
Called pt and left message informing him that he needed to contact his PCP to request a refill for gabapentin 300 mg tablet and that Dr. [name] did not prescribe this medication and if he has any other problems, questions or concerns, about his heart medications to contact the office.

## 2019-03-18 ENCOUNTER — Ambulatory Visit: Payer: Medicare Other | Attending: Family Medicine | Admitting: Rehabilitation

## 2019-03-18 DIAGNOSIS — M545 Low back pain: Secondary | ICD-10-CM | POA: Insufficient documentation

## 2019-03-18 DIAGNOSIS — G8929 Other chronic pain: Secondary | ICD-10-CM | POA: Insufficient documentation

## 2019-03-18 DIAGNOSIS — R42 Dizziness and giddiness: Secondary | ICD-10-CM | POA: Insufficient documentation

## 2019-03-18 DIAGNOSIS — M6281 Muscle weakness (generalized): Secondary | ICD-10-CM | POA: Insufficient documentation

## 2019-03-18 DIAGNOSIS — R2689 Other abnormalities of gait and mobility: Secondary | ICD-10-CM | POA: Insufficient documentation

## 2019-03-18 DIAGNOSIS — R2681 Unsteadiness on feet: Secondary | ICD-10-CM | POA: Insufficient documentation

## 2019-03-18 DIAGNOSIS — R296 Repeated falls: Secondary | ICD-10-CM | POA: Insufficient documentation

## 2019-03-19 ENCOUNTER — Ambulatory Visit: Payer: Medicare Other | Admitting: Physical Therapy

## 2019-03-19 ENCOUNTER — Other Ambulatory Visit: Payer: Self-pay

## 2019-03-19 ENCOUNTER — Encounter: Payer: Self-pay | Admitting: Physical Therapy

## 2019-03-19 DIAGNOSIS — M545 Low back pain, unspecified: Secondary | ICD-10-CM

## 2019-03-19 DIAGNOSIS — R2689 Other abnormalities of gait and mobility: Secondary | ICD-10-CM | POA: Diagnosis present

## 2019-03-19 DIAGNOSIS — R2681 Unsteadiness on feet: Secondary | ICD-10-CM | POA: Diagnosis present

## 2019-03-19 DIAGNOSIS — R42 Dizziness and giddiness: Secondary | ICD-10-CM | POA: Diagnosis present

## 2019-03-19 DIAGNOSIS — R296 Repeated falls: Secondary | ICD-10-CM

## 2019-03-19 DIAGNOSIS — G8929 Other chronic pain: Secondary | ICD-10-CM

## 2019-03-19 DIAGNOSIS — M6281 Muscle weakness (generalized): Secondary | ICD-10-CM | POA: Diagnosis present

## 2019-03-19 NOTE — Therapy (Signed)
[facility] [address] [place], [place], [address] Phone: [phone]   Fax:  [phone]  Physical Therapy Evaluation  Patient Details  Name: Troy Combs MRN: [id number] Date of Birth: 04/17/1932 Referring Provider (PT): Antony Contras MD    Encounter Date: 03/19/2019  PT End of Session - 03/19/19 1300    Visit Number  1    Number of Visits  17    Date for PT Re-Evaluation  05/19/19    Authorization Type  [employer] Medicare - 10th visit PN    PT Start Time  1150    PT Stop Time  1240    PT Time Calculation (min)  50 min    Activity Tolerance  Patient tolerated treatment well    Behavior During Therapy  [facility] for tasks assessed/performed       Past Medical History:  Diagnosis Date  . Allergic rhinitis 09/20/2014  . Anemia, unspecified   . Arthritis   . Bladder neck obstruction   . Cataracts, bilateral   . Coronary atherosclerosis of unspecified type of vessel, native or graft   . Diminished hearing, bilateral   . Dysthymic disorder   . GERD (gastroesophageal reflux disease)   . Heart murmur    as a child  . Impaired glucose tolerance 12/02/2012  . Impotence of organic origin   . Lumbar radiculopathy   . Malignant melanoma of skin of upper limb, including shoulder ([place])   . Old myocardial infarction   . Personal history of malignant melanoma of skin   . Personal history of other diseases of digestive system   . Thoracic or lumbosacral neuritis or radiculitis, unspecified   . Unspecified essential hypertension     Past Surgical History:  Procedure Laterality Date  . ARTHRODESIS  07/06/2002   of left long finger distal interphalangeal joint with Kirschner wire fixation X 3  . ATRIAL ABLATION SURGERY  05/04/2001   Dr. [name]  . CARDIAC CATHETERIZATION  09/05/2008   Revealing 4 of 4 patent grafts with native multivessel coronary artery disease, EF  of 60% without regional wall motion abnormalities.   Marland Kitchen CATARACT EXTRACTION, BILATERAL    . CHOLECYSTECTOMY    . COLONOSCOPY    . CORONARY ANGIOPLASTY  1993  . CORONARY ARTERY BYPASS GRAFT  10/17/2000   [name]. [name], [place]     AFTER LIVER TRAUMA AND HAND SURGERY  . EYE SURGERY    . HERNIA REPAIR     BILATERAL  . LEFT HEART CATH AND CORS/GRAFTS ANGIOGRAPHY N/A 04/03/2017   Procedure: LEFT HEART CATH AND CORS/GRAFTS ANGIOGRAPHY;  Surgeon: Leonie Man, MD;  Location: [facility];  Service: Cardiovascular;  Laterality: N/A;  . LUMBAR LAMINECTOMY/DECOMPRESSION MICRODISCECTOMY N/A 02/02/2016   Procedure: MICRODISCECTOMY LUMBAR FOUR- LUMBAR FIVE;  Surgeon: Ashok Pall, MD;  Location: [facility];  Service: Neurosurgery;  Laterality: N/A;  MICRODISCECTOMY L4-L5  . LUMBAR LAMINECTOMY/DECOMPRESSION MICRODISCECTOMY Right 08/02/2016   Procedure: MICRODISCECTOMY LUMBAR FOUR- LUMBAR FIVE RIGHT;  Surgeon: Ashok Pall, MD;  Location: [facility];  Service: Neurosurgery;  Laterality: Right;  . punctured eardrum      to relieve blood build up   for ear infection  . TONSILLECTOMY      There were no vitals filed for this visit.   Subjective Assessment - 03/19/19 1159    Subjective  Pt reports lumbar pain that does not radiate with onset ~4 years ago on a trip with his wife. The  pain was so severe he spent the week in a w/c. He had a micro discectomy in 2017 and 2018 at levels L3-L4 L4-L5 and pt states that he feels like his sx have become progressively worse since. He reports episodes of unsteadiness when ambulating short distance and uses a walking stick. Pt reports falling once about a week ago when getting up from "like his feet slipped out from under him" and was unable to get back up by himself but received help from his wife. A similar episode occurred a few nights after which prompted him and his wife to seek medical help leading to scheduling this PT appt. He has numbness in bilateral toes. He states that he does not want to  have another back surgery because the recovery from the last surgery was traumatic and wants to maintain a higher QOL.    Patient is accompained by:  Family member   wife   Pertinent History  monocular vertical diplopia, HTN, CAD s/p MI, CABG, neurogenic orthostatic hypotension, neuropathy, cervicogenic HA    Limitations  Standing;Walking    How long can you stand comfortably?  10 minutes    Patient Stated Goals  maintain high QOL, get back to doing household chores without pain, get out and walk, to feel safe walking/with balance in order for him and his wife to get a dog    Currently in Pain?  Yes    Pain Score  0-No pain   worst: 9/10   Pain Location  Back    Pain Orientation  Mid;Lower    Pain Descriptors / Indicators  Sharp;Aching    Pain Type  Chronic pain    Pain Radiating Towards  none    Pain Onset  More than a month ago    Pain Frequency  Constant    Aggravating Factors   standing, walking    Pain Relieving Factors  sitting, rest, gabapentin    Effect of Pain on Daily Activities  ADLs, household chores         03/19/19 0001  Assessment  Medical Diagnosis Lumbar spinal stenosis  Referring Provider (PT) Antony Contras MD   Onset Date/Surgical Date  (~4 years ago)  Prior Therapy yes  ([facility] OPPT neuro for vertigo)  Precautions  Precautions Fall  Balance Screen  Has the patient fallen in the past 6 months Yes  How many times? 2  Has the patient had a decrease in activity level because of a fear of falling?  Yes  Is the patient reluctant to leave their home because of a fear of falling?  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  Living Arrangements Spouse/significant other  Available Help at Discharge Family  Type of [place] to enter (in process of getting [facility] approval for a ramp )  Entrance Stairs-Number of Steps 4  Entrance Stairs-Rails Right;Left;Can reach both  Home Layout One level  [place] - 2 wheels   Sensation  Light Touch Appears Intact  ROM / Strength  AROM / PROM / Strength Strength;AROM  AROM  Overall AROM  Deficits  AROM Assessment Site Lumbar  Lumbar Flexion WFL  Lumbar Extension 90% limited (significantly knee flexion)  Lumbar - Right Side Bend >75% limited (significant knee flexion and pelvis rotation)  Lumbar - Left Side Bend >75% limited (significant knee flexion and pelvis rotation)  Strength  Overall Strength Deficits  Strength Assessment Site Hip;Knee;Ankle  Right/Left Hip Right;Left  Right/Left Knee Right;Left  Right/Left Ankle Right;Left  Right Hip Flexion 3+/5  Left Hip Flexion 3+/5  Right Knee Flexion 4-/5  Right Knee Extension 4/5  Left Knee Flexion 4-/5  Left Knee Extension 4/5  Right Ankle Dorsiflexion 4/5  Left Ankle Dorsiflexion 4/5  Special Tests  Other special tests (-) Slump test bilaterally, sx d/t mm tightness  Transfers  Transfers Sit to Stand;Stand to Sit  Sit to Stand 6: Modified independent (Device/Increase time);With upper extremity assist;With armrests  Stand to Sit 6: Modified independent (Device/Increase time);With upper extremity assist;With armrests  Ambulation/Gait  Ambulation/Gait Yes  Ambulation/Gait Assistance 6: Modified independent (Device/Increase time) (uses walking stick)  Assistive device  (uses walking stick)  Gait Pattern Step-through pattern;Decreased arm swing - right;Decreased arm swing - left;Decreased stride length;Decreased step length - left;Decreased step length - right;Decreased hip/knee flexion - right;Decreased hip/knee flexion - left;Decreased trunk rotation;Trunk flexed;Wide base of support  Standardized Balance Assessment  Standardized Balance Assessment TUG  Timed Up and Go Test  Normal TUG (seconds) 17.13 (reports of dizziness w/turning, uses walking stick)                Objective measurements completed on examination: See above findings.              PT Education -  03/19/19 1259    Education Details  PT role, PT examination findings, POC    Person(s) Educated  Patient;Spouse    Methods  Explanation    Comprehension  Verbalized understanding       PT Short Term Goals - 03/19/19 1329      PT SHORT TERM GOAL #1   Title  Pt is independent with initial HEP  (ALL STG due by 04/19/2019)    Time  4    Period  Weeks    Status  New    Target Date  04/19/19      PT SHORT TERM GOAL #2   Title  Pt will participate in further balance assessments with LTG to be reset: BERG and five time sit to stand    Time  4    Period  Weeks    Status  New    Target Date  04/19/19      PT SHORT TERM GOAL #3   Title  Patient will participate in full vestibular evaluation with LTG set if needed and will initiate vestibular HEP if needed    Time  4    Period  Weeks    Status  New    Target Date  04/19/19      PT SHORT TERM GOAL #4   Title  Pt will report being able to stand for >10-15 minutes at home to perform housework such as washing dishes or do laundry with pain no >5/10    Baseline  10 minutes, 9/10    Time  4    Period  Weeks    Status  New    Target Date  04/19/19      PT SHORT TERM GOAL #5   Title  Pt will report walking around neighborhood for up to 8-10 minutes with wife's supervision and LRAD with pain no >5/10    Time  4    Period  Weeks    Status  New    Target Date  04/19/19        PT Long Term Goals - 03/19/19 1324      PT LONG TERM GOAL #1   Title  Pt reports ability to ambulate >/=  30 minutes with LRAD without increase in pain. (all LTG target date: 05/19/19)    Baseline  03/19/19: no more than 10 minutes, uses walking stick    Time  8    Period  Weeks    Status  New    Target Date  05/19/19      PT LONG TERM GOAL #2   Title  Pt demonstrates TUG </= 13 seconds to indicate lower fall risk.    Baseline  03/19/19: 17.33s    Time  8    Period  Weeks    Status  New    Target Date  05/19/19      PT LONG TERM GOAL #3   Title  Pt  will report being able to stand >20 minutes at home to perform household chores with pain no >3/10    Time  8    Period  Weeks    Status  New    Target Date  05/19/19      PT LONG TERM GOAL #4   Title  5xSTS to be assessed    Time  8    Period  Weeks    Status  New    Target Date  05/19/19      PT LONG TERM GOAL #5   Title  BERG to be assessed    Time  8    Period  Weeks    Status  New    Target Date  05/14/19      Additional Long Term Goals   Additional Long Term Goals  Yes      PT LONG TERM GOAL #6   Title  Pt will demonstrate 25% greater lumbar ROM into extension and side flexion    Time  8    Period  Weeks    Status  New    Target Date  05/19/19      PT LONG TERM GOAL #7   Title  Vestibular goal if needed    Time  8    Period  Weeks    Status  New    Target Date  05/19/19             Plan - 03/19/19 1311    Clinical Impression Statement  Patient is a 85 yo male referred to Neuro OPPT for evaluation of functional deficits related to dx of lumbar spinal stenosis. Pt's PMH is significant for: monocular vertical diplopia, HTN, CAD s/p MI, CABG, neurogenic orthostatic hypotension, neuropathy, cervicogenic HA, microdiscectomy (2017, 2018). Pt examination revealed deficits in global LE strength, balance, and gait abnormalities; pt also presents with intermittent dizziness. His lumbar ROM was significantly limited in all directions except for flexion. Pt entered clinic ambulating with a walking stick, forward trunk flexion, wide BOS, dec pelvic and truncal rotation, rigid upper trunk and was guarded. His TUG was 17.33s indicating fall risk. He appears to have fear-avoidance behaviors based on gait assessment and subjective history. Upon leaving the evaluation, pt stated that giving him home exercises would be a waste because he does not have the motivation to do exercises. Pt may benefit from skilled therapy services to address deficits and progress towards achieving  functional goals.    Personal Factors and Comorbidities  Age;Comorbidity 3+;Behavior Pattern;Fitness;Past/Current Experience;Time since onset of injury/illness/exacerbation    Comorbidities  monocular vertical diplopia, HTN, CAD s/p MI, CABG, neurogenic orthostatic hypotension, neuropathy, cervicogenic HA, microdiscectomy (2017, 2018)    Examination-Activity Limitations  Locomotion Level;Stairs;Stand    Examination-Participation Restrictions  Cleaning;Community Activity;Laundry;Yard Work    [employer]chant [employer] officer  Evolving/Moderate complexity    Clinical Decision Making  Moderate    Rehab Potential  Fair   pt stated not wanting to do exercises at home   PT Frequency  2x / week    PT Duration  8 weeks    PT Treatment/Interventions  ADLs/Self Care Home Management;Canalith Repostioning;Gait training;Stair training;Therapeutic activities;Therapeutic exercise;Balance training;Patient/family education;Neuromuscular re-education;Manual techniques;Vestibular;Electrical Stimulation;Moist Heat;DME Instruction;Functional mobility training;Passive range of motion;Dry needling;Spinal Manipulations;Cryotherapy    PT Next Visit Plan  assess 5xSTS, BERG, have pt fill out FABQ.  Reset LTG baselines.  May need to have a conversation with patient about his role in recovery and importance of finding exercise/activity he would perform outside therapy or improvements may be limited.  Audra to perform vestibular evaluation.  PNE - localization and two point discrimination on lumbar spine.    Recommended Other Services  Please only use treatments like heat/ice/TENS if in combination with movement or at very end of session.    Consulted and Agree with Plan of Care  Patient;Family member/caregiver    Family Member Consulted  wife [name]       Patient will benefit from skilled therapeutic intervention in order to improve the following deficits and impairments:  Abnormal gait, Decreased range of motion, Difficulty  walking, Dizziness, Decreased activity tolerance, Decreased endurance, Pain, Decreased balance, Decreased strength, Decreased mobility, Impaired flexibility, Impaired sensation  Visit Diagnosis: Other abnormalities of gait and mobility  Chronic midline low back pain without sciatica  Muscle weakness (generalized)  Unsteadiness on feet  Dizziness and giddiness  Repeated falls     Problem List Patient Active Problem List   Diagnosis Date Noted  . Personal history of malignant melanoma of skin   . Lumbar radiculopathy   . Heart murmur   . GERD (gastroesophageal reflux disease)   . Diminished hearing, bilateral   . Cataracts, bilateral   . Bladder neck obstruction   . Arthritis   . Constipation 12/12/2016  . Nausea 12/12/2016  . Coronary artery disease involving native coronary artery of native heart without angina pectoris 11/22/2016  . Dyslipidemia 11/22/2016  . History of coronary artery bypass surgery 11/22/2016  . Depression with anxiety   . Orthostasis 09/20/2016  . HNP (herniated nucleus pulposus), lumbar 02/02/2016  . Spinal stenosis of lumbar region 01/24/2016  . Sciatica, right side 01/24/2016  . Prediabetes 01/24/2016  . Otitis, externa, infective 01/24/2016  . Left lumbar radiculopathy 01/15/2016  . PVC's (premature ventricular contractions) 05/24/2015  . Peripheral polyneuropathy 03/06/2015  . BPPV (benign paroxysmal positional vertigo) 03/06/2015  . Headache 01/18/2015  . Vertigo 01/18/2015  . Allergic rhinitis 09/20/2014  . Dyspnea on exertion 08/10/2014  . Severe dizziness 08/10/2014  . Acute diverticulitis 05/18/2014  . Diarrhea 05/18/2014  . LLQ pain 05/13/2014  . Chronic meniscal tear of knee 09/22/2013  . Primary localized osteoarthrosis, lower leg 09/22/2013  . Neck pain, bilateral 12/18/2012  . Headache(784.0) 12/18/2012  . Binocular visual disturbance 12/18/2012  . Epiretinal membrane 12/17/2012  . Status post intraocular lens implant  12/17/2012  . Preventative health care 12/02/2012  . Low back pain 12/02/2012  . Impaired glucose tolerance 12/02/2012  . Palpitations 10/09/2010  . B12 deficiency 08/30/2010  . Hearing loss 08/30/2010  . Tinnitus 08/30/2010  . Neurogenic orthostatic hypotension ([place]) 06/28/2009  . HYPERLIPIDEMIA TYPE IIB / III 05/15/2009  . CHEST PAIN UNSPECIFIED 11/23/2008  . Hypotension, unspecified 10/12/2008  . DIZZINESS 10/12/2008  . Shortness of breath 09/14/2008  .  UNSPECIFIED ANEMIA 09/13/2008  . ANXIETY DEPRESSION 09/13/2008  . Old myocardial infarction 09/13/2008  . GASTROESOPHAGEAL REFLUX DISEASE, HX OF 09/13/2008  . LUMBAR RADICULOPATHY, LEFT 08/23/2008  . MELANOMA, SHOULDER 11/27/2006  . Essential hypertension 11/27/2006  . Coronary atherosclerosis 11/27/2006  . BPH associated with nocturia 11/27/2006  . ERECTILE DYSFUNCTION, ORGANIC 11/27/2006    Juliann Pulse SPT 03/20/2019, 1:46 PM  [facility] [address] [place], [place], [address] Phone: [phone]   Fax:  [phone]  Name: ION WIESNER MRN: [id number] Date of Birth: [birth year]

## 2019-03-20 ENCOUNTER — Other Ambulatory Visit: Payer: Self-pay

## 2019-03-30 ENCOUNTER — Other Ambulatory Visit: Payer: Self-pay

## 2019-03-30 ENCOUNTER — Ambulatory Visit: Payer: Medicare Other | Attending: Family Medicine | Admitting: Physical Therapy

## 2019-03-30 DIAGNOSIS — R2681 Unsteadiness on feet: Secondary | ICD-10-CM | POA: Insufficient documentation

## 2019-03-30 DIAGNOSIS — M6281 Muscle weakness (generalized): Secondary | ICD-10-CM | POA: Diagnosis present

## 2019-03-30 DIAGNOSIS — R296 Repeated falls: Secondary | ICD-10-CM | POA: Insufficient documentation

## 2019-03-30 DIAGNOSIS — R2689 Other abnormalities of gait and mobility: Secondary | ICD-10-CM | POA: Diagnosis present

## 2019-03-30 DIAGNOSIS — R42 Dizziness and giddiness: Secondary | ICD-10-CM | POA: Diagnosis present

## 2019-03-30 DIAGNOSIS — M545 Low back pain, unspecified: Secondary | ICD-10-CM

## 2019-03-30 DIAGNOSIS — G8929 Other chronic pain: Secondary | ICD-10-CM | POA: Diagnosis present

## 2019-03-30 NOTE — Therapy (Signed)
[facility] [address] [place], [place], [address] Phone: [phone]   Fax:  [phone]  Physical Therapy Treatment  Patient Details  Name: Troy Combs MRN: [id number] Date of Birth: 12-25-1931 Referring Provider (PT): Antony Contras MD    Encounter Date: 03/30/2019  PT End of Session - 03/30/19 1451    Visit Number  2    Number of Visits  17    Date for PT Re-Evaluation  05/19/19    Authorization Type  [employer] Medicare - 10th visit PN    PT Start Time  1400    PT Stop Time  1440    PT Time Calculation (min)  40 min    Activity Tolerance  Patient tolerated treatment well    Behavior During Therapy  [facility] for tasks assessed/performed       Past Medical History:  Diagnosis Date  . Allergic rhinitis 09/20/2014  . Anemia, unspecified   . Arthritis   . Bladder neck obstruction   . Cataracts, bilateral   . Coronary atherosclerosis of unspecified type of vessel, native or graft   . Diminished hearing, bilateral   . Dysthymic disorder   . GERD (gastroesophageal reflux disease)   . Heart murmur    as a child  . Impaired glucose tolerance 12/02/2012  . Impotence of organic origin   . Lumbar radiculopathy   . Malignant melanoma of skin of upper limb, including shoulder ([place])   . Old myocardial infarction   . Personal history of malignant melanoma of skin   . Personal history of other diseases of digestive system   . Thoracic or lumbosacral neuritis or radiculitis, unspecified   . Unspecified essential hypertension     Past Surgical History:  Procedure Laterality Date  . ARTHRODESIS  07/06/2002   of left long finger distal interphalangeal joint with Kirschner wire fixation X 3  . ATRIAL ABLATION SURGERY  05/04/2001   Dr. [name]  . CARDIAC CATHETERIZATION  09/05/2008   Revealing 4 of 4 patent grafts with native multivessel coronary artery disease, EF  of 60% without regional wall motion abnormalities.  Marland Kitchen  CATARACT EXTRACTION, BILATERAL    . CHOLECYSTECTOMY    . COLONOSCOPY    . CORONARY ANGIOPLASTY  1993  . CORONARY ARTERY BYPASS GRAFT  10/17/2000   [name]. [name], [place]     AFTER LIVER TRAUMA AND HAND SURGERY  . EYE SURGERY    . HERNIA REPAIR     BILATERAL  . LEFT HEART CATH AND CORS/GRAFTS ANGIOGRAPHY N/A 04/03/2017   Procedure: LEFT HEART CATH AND CORS/GRAFTS ANGIOGRAPHY;  Surgeon: Leonie Man, MD;  Location: [facility];  Service: Cardiovascular;  Laterality: N/A;  . LUMBAR LAMINECTOMY/DECOMPRESSION MICRODISCECTOMY N/A 02/02/2016   Procedure: MICRODISCECTOMY LUMBAR FOUR- LUMBAR FIVE;  Surgeon: Ashok Pall, MD;  Location: [facility];  Service: Neurosurgery;  Laterality: N/A;  MICRODISCECTOMY L4-L5  . LUMBAR LAMINECTOMY/DECOMPRESSION MICRODISCECTOMY Right 08/02/2016   Procedure: MICRODISCECTOMY LUMBAR FOUR- LUMBAR FIVE RIGHT;  Surgeon: Ashok Pall, MD;  Location: [facility];  Service: Neurosurgery;  Laterality: Right;  . punctured eardrum      to relieve blood build up   for ear infection  . TONSILLECTOMY      There were no vitals filed for this visit.  Subjective Assessment - 03/30/19 1446    Subjective  Relays about 5/10 low back pain today, he woke up with this pain despite sleeping well last night.  Patient is accompained by:  Family member   wife   Pertinent History  monocular vertical diplopia, HTN, CAD s/p MI, CABG, neurogenic orthostatic hypotension, neuropathy, cervicogenic HA    Limitations  Standing;Walking    How long can you stand comfortably?  10 minutes    Patient Stated Goals  maintain high QOL, get back to doing household chores without pain, get out and walk, to feel safe walking/with balance in order for him and his wife to get a dog    Pain Onset  More than a month ago         [facility] PT Assessment - 03/30/19 0001      Assessment   Medical Diagnosis  Lumbar spinal stenosis    Referring Provider (PT)  Antony Contras MD        Transfers   Transfers  --    Sit to Stand  --    Five time sit to stand comments   17 seconds    Number of Reps  --      Ambulation/Gait   Gait Comments  --      Standardized Balance Assessment   Standardized Balance Assessment  Berg Balance Test      Berg Balance Test   Sit to Stand  Able to stand  independently using hands    Standing Unsupported  Able to stand 2 minutes with supervision    Sitting with Back Unsupported but Feet Supported on Floor or Stool  Able to sit safely and securely 2 minutes    Stand to Sit  Sits safely with minimal use of hands    Transfers  Able to transfer safely, minor use of hands    Standing Unsupported with Eyes Closed  Able to stand 10 seconds with supervision    Standing Unsupported with Feet Together  Able to place feet together independently and stand for 1 minute with supervision    From Standing, Reach Forward with Outstretched Arm  Can reach forward >12 cm safely (5")    From Standing Position, Pick up Object from Floor  Able to pick up shoe, needs supervision    From Standing Position, Turn to Look Behind Over each Shoulder  Turn sideways only but maintains balance    Turn 360 Degrees  Needs close supervision or verbal cueing    Standing Unsupported, Alternately Place Feet on Step/Stool  Able to complete >2 steps/needs minimal assist    Standing Unsupported, One Foot in Front  Able to take small step independently and hold 30 seconds    Standing on One Leg  Tries to lift leg/unable to hold 3 seconds but remains standing independently    Total Score  37                   [facility] Adult PT Treatment/Exercise - 03/30/19 0001      Ambulation/Gait   Ambulation/Gait  Yes    Ambulation/Gait Assistance  6: Modified independent (Device/Increase time)    Ambulation Distance (Feet)  115 Feet   X2   Assistive device  --   walking stick   Gait Pattern  Step-through pattern;Decreased arm swing - right;Decreased arm swing - left;Decreased  stride length;Decreased step length - left;Decreased step length - right;Decreased hip/knee flexion - right;Decreased hip/knee flexion - left;Decreased trunk rotation;Trunk flexed;Wide base of support      Neuro Re-ed    Neuro Re-ed Details   performed BERG balance assessment, then tandem stance 30 sec X 1 bilat, and balance  with eyes closed feet apart 30 sec X 2      Exercises   Exercises  Other Exercises    Other Exercises   lumbar stretching: low trunk rotations 10 sec X 5 reps bilat, then SKTC in supine hooklying 30 sec X 2 reps bilat. Nu step for endurance and ROM UE/LE L5 for 5 min             PT Education - 03/30/19 1450    Education Details  HEP, importance of exercise outside of PT    Person(s) Educated  Patient    Methods  Explanation;Demonstration;Verbal cues;Handout    Comprehension  Verbalized understanding;Returned demonstration;Need further instruction       PT Short Term Goals - 03/19/19 1329      PT SHORT TERM GOAL #1   Title  Pt is independent with initial HEP  (ALL STG due by 04/19/2019)    Time  4    Period  Weeks    Status  New    Target Date  04/19/19      PT SHORT TERM GOAL #2   Title  Pt will participate in further balance assessments with LTG to be reset: BERG and five time sit to stand    Time  4    Period  Weeks    Status  New    Target Date  04/19/19      PT SHORT TERM GOAL #3   Title  Patient will participate in full vestibular evaluation with LTG set if needed and will initiate vestibular HEP if needed    Time  4    Period  Weeks    Status  New    Target Date  04/19/19      PT SHORT TERM GOAL #4   Title  Pt will report being able to stand for >10-15 minutes at home to perform housework such as washing dishes or do laundry with pain no >5/10    Baseline  10 minutes, 9/10    Time  4    Period  Weeks    Status  New    Target Date  04/19/19      PT SHORT TERM GOAL #5   Title  Pt will report walking around neighborhood for up to 8-10  minutes with wife's supervision and LRAD with pain no >5/10    Time  4    Period  Weeks    Status  New    Target Date  04/19/19        PT Long Term Goals - 03/30/19 1508      PT LONG TERM GOAL #1   Title  Pt reports ability to ambulate >/= 30 minutes with LRAD without increase in pain. (all LTG target date: 05/19/19)    Baseline  03/19/19: no more than 10 minutes, uses walking stick    Time  8    Period  Weeks    Status  On-going      PT LONG TERM GOAL #2   Title  Pt demonstrates TUG </= 13 seconds to indicate lower fall risk.    Baseline  03/19/19: 17.33s    Time  8    Period  Weeks    Status  On-going      PT LONG TERM GOAL #3   Title  Pt will report being able to stand >20 minutes at home to perform household chores with pain no >3/10    Time  8    Period  Weeks    Status  On-going      PT LONG TERM GOAL #4   Title  Will improve 5xSTS to 13 seconds or less    Baseline  17    Time  8    Period  Weeks    Status  Revised      PT LONG TERM GOAL #5   Title  Will improve BERG to at least 46 to show improved balance.    Baseline  37    Time  8    Period  Weeks    Status  Revised      PT LONG TERM GOAL #6   Title  Pt will demonstrate 25% greater lumbar ROM into extension and side flexion    Time  8    Period  Weeks    Status  On-going      PT LONG TERM GOAL #7   Title  Vestibular goal if needed    Time  8    Period  Weeks    Status  New            Plan - 03/30/19 1451    Clinical Impression Statement  Session focused on testing for BERG balance and 5TSTS to establish baselines for balance and endurance today. His balance tests score him at a high falls risk and indicate he needs to continue with AD (uses walking stick) Then had him perform gentle lumbar stretching, balance training, and endurance with walking laps and endurance on the Nu step. Had long conversation with him about importance of performing consistent exercise outside of PT to maximize his  function. He agrees he will try at home and he was given short HEP to perform. He will likely continue to need encouragement for home exercise.    Personal Factors and Comorbidities  Age;Comorbidity 3+;Behavior Pattern;Fitness;Past/Current Experience;Time since onset of injury/illness/exacerbation    Comorbidities  monocular vertical diplopia, HTN, CAD s/p MI, CABG, neurogenic orthostatic hypotension, neuropathy, cervicogenic HA, microdiscectomy (2017, 2018)    Examination-Activity Limitations  Locomotion Level;Stairs;Stand    Examination-Participation Restrictions  Cleaning;Community Activity;Laundry;Yard Work    [employer]  Evolving/Moderate complexity    Rehab Potential  Fair   pt stated not wanting to do exercises at home   PT Frequency  2x / week    PT Duration  8 weeks    PT Treatment/Interventions  ADLs/Self Care Home Management;Canalith Repostioning;Gait training;Stair training;Therapeutic activities;Therapeutic exercise;Balance training;Patient/family education;Neuromuscular re-education;Manual techniques;Vestibular;Electrical Stimulation;Moist Heat;DME Instruction;Functional mobility training;Passive range of motion;Dry needling;Spinal Manipulations;Cryotherapy    PT Next Visit Plan  Audra to perform vestibular evaluation.  PNE - localization and two point discrimination on lumbar spine.    Consulted and Agree with Plan of Care  Patient;Family member/caregiver    Family Member Consulted  wife [name]       Patient will benefit from skilled therapeutic intervention in order to improve the following deficits and impairments:  Abnormal gait, Decreased range of motion, Difficulty walking, Dizziness, Decreased activity tolerance, Decreased endurance, Pain, Decreased balance, Decreased strength, Decreased mobility, Impaired flexibility, Impaired sensation  Visit Diagnosis: Chronic midline low back pain without sciatica  Other abnormalities of gait and  mobility  Muscle weakness (generalized)  Unsteadiness on feet  Dizziness and giddiness  Repeated falls     Problem List Patient Active Problem List   Diagnosis Date Noted  . Personal history of malignant melanoma of skin   . Lumbar radiculopathy   . Heart murmur   . GERD (gastroesophageal reflux  disease)   . Diminished hearing, bilateral   . Cataracts, bilateral   . Bladder neck obstruction   . Arthritis   . Constipation 12/12/2016  . Nausea 12/12/2016  . Coronary artery disease involving native coronary artery of native heart without angina pectoris 11/22/2016  . Dyslipidemia 11/22/2016  . History of coronary artery bypass surgery 11/22/2016  . Depression with anxiety   . Orthostasis 09/20/2016  . HNP (herniated nucleus pulposus), lumbar 02/02/2016  . Spinal stenosis of lumbar region 01/24/2016  . Sciatica, right side 01/24/2016  . Prediabetes 01/24/2016  . Otitis, externa, infective 01/24/2016  . Left lumbar radiculopathy 01/15/2016  . PVC's (premature ventricular contractions) 05/24/2015  . Peripheral polyneuropathy 03/06/2015  . BPPV (benign paroxysmal positional vertigo) 03/06/2015  . Headache 01/18/2015  . Vertigo 01/18/2015  . Allergic rhinitis 09/20/2014  . Dyspnea on exertion 08/10/2014  . Severe dizziness 08/10/2014  . Acute diverticulitis 05/18/2014  . Diarrhea 05/18/2014  . LLQ pain 05/13/2014  . Chronic meniscal tear of knee 09/22/2013  . Primary localized osteoarthrosis, lower leg 09/22/2013  . Neck pain, bilateral 12/18/2012  . Headache(784.0) 12/18/2012  . Binocular visual disturbance 12/18/2012  . Epiretinal membrane 12/17/2012  . Status post intraocular lens implant 12/17/2012  . Preventative health care 12/02/2012  . Low back pain 12/02/2012  . Impaired glucose tolerance 12/02/2012  . Palpitations 10/09/2010  . B12 deficiency 08/30/2010  . Hearing loss 08/30/2010  . Tinnitus 08/30/2010  . Neurogenic orthostatic hypotension ([place])  06/28/2009  . HYPERLIPIDEMIA TYPE IIB / III 05/15/2009  . CHEST PAIN UNSPECIFIED 11/23/2008  . Hypotension, unspecified 10/12/2008  . DIZZINESS 10/12/2008  . Shortness of breath 09/14/2008  . UNSPECIFIED ANEMIA 09/13/2008  . ANXIETY DEPRESSION 09/13/2008  . Old myocardial infarction 09/13/2008  . GASTROESOPHAGEAL REFLUX DISEASE, HX OF 09/13/2008  . LUMBAR RADICULOPATHY, LEFT 08/23/2008  . MELANOMA, SHOULDER 11/27/2006  . Essential hypertension 11/27/2006  . Coronary atherosclerosis 11/27/2006  . BPH associated with nocturia 11/27/2006  . ERECTILE DYSFUNCTION, ORGANIC 11/27/2006    Silvestre Mesi 03/30/2019, 3:12 PM  [facility] [address] [place], [place], [address] Phone: [phone]   Fax:  [phone]  Name: JORDON TRULLINGER MRN: [id number] Date of Birth: [birth year]

## 2019-03-30 NOTE — Patient Instructions (Signed)
Access Code: [email or web address]  URL: [email or web address]  Date: 03/30/2019  Prepared by: Elsie Ra   Exercises  Supine Lower Trunk Rotation - 5 reps - 1 sets - 10 sec hold - 2x daily - 6x weekly  Hooklying Single Knee to Chest Stretch - 2 reps - 30 hold - 2x daily - 6x weekly  Standing Tandem Balance with Unilateral Counter Support - 2 sets - 30 hold - 2x daily - 6x weekly  Standing Balance in Corner with Eyes Closed - 2-3 reps - 30 sec hold - 2x daily - 6x weekly  Sit to Stand - 5 reps - 2-3 sets - 2x daily - 6x weekly

## 2019-04-01 ENCOUNTER — Other Ambulatory Visit: Payer: Self-pay

## 2019-04-01 ENCOUNTER — Ambulatory Visit: Payer: Medicare Other | Admitting: Physical Therapy

## 2019-04-01 DIAGNOSIS — R296 Repeated falls: Secondary | ICD-10-CM

## 2019-04-01 DIAGNOSIS — M6281 Muscle weakness (generalized): Secondary | ICD-10-CM

## 2019-04-01 DIAGNOSIS — R2689 Other abnormalities of gait and mobility: Secondary | ICD-10-CM

## 2019-04-01 DIAGNOSIS — G8929 Other chronic pain: Secondary | ICD-10-CM

## 2019-04-01 DIAGNOSIS — R42 Dizziness and giddiness: Secondary | ICD-10-CM

## 2019-04-01 DIAGNOSIS — M545 Low back pain, unspecified: Secondary | ICD-10-CM

## 2019-04-01 DIAGNOSIS — R2681 Unsteadiness on feet: Secondary | ICD-10-CM

## 2019-04-01 NOTE — Therapy (Signed)
[facility] [address] [place], [place], [address] Phone: [phone]   Fax:  [phone]  Physical Therapy Treatment  Patient Details  Name: Troy Combs MRN: [id number] Date of Birth: 1931-12-27 Referring Provider (PT): Antony Contras MD    Encounter Date: 04/01/2019  PT End of Session - 04/01/19 1511    Visit Number  3    Number of Visits  17    Date for PT Re-Evaluation  05/19/19    Authorization Type  [employer] Medicare - 10th visit PN    PT Start Time  1413   pt 13 min late for his appointment   PT Stop Time  1446    PT Time Calculation (min)  33 min    Activity Tolerance  Patient tolerated treatment well    Behavior During Therapy  [facility] for tasks assessed/performed       Past Medical History:  Diagnosis Date  . Allergic rhinitis 09/20/2014  . Anemia, unspecified   . Arthritis   . Bladder neck obstruction   . Cataracts, bilateral   . Coronary atherosclerosis of unspecified type of vessel, native or graft   . Diminished hearing, bilateral   . Dysthymic disorder   . GERD (gastroesophageal reflux disease)   . Heart murmur    as a child  . Impaired glucose tolerance 12/02/2012  . Impotence of organic origin   . Lumbar radiculopathy   . Malignant melanoma of skin of upper limb, including shoulder ([place])   . Old myocardial infarction   . Personal history of malignant melanoma of skin   . Personal history of other diseases of digestive system   . Thoracic or lumbosacral neuritis or radiculitis, unspecified   . Unspecified essential hypertension     Past Surgical History:  Procedure Laterality Date  . ARTHRODESIS  07/06/2002   of left long finger distal interphalangeal joint with Kirschner wire fixation X 3  . ATRIAL ABLATION SURGERY  05/04/2001   Dr. [name]  . CARDIAC CATHETERIZATION  09/05/2008   Revealing 4 of 4 patent grafts with native multivessel coronary artery disease, EF  of 60% without  regional wall motion abnormalities.  Marland Kitchen CATARACT EXTRACTION, BILATERAL    . CHOLECYSTECTOMY    . COLONOSCOPY    . CORONARY ANGIOPLASTY  1993  . CORONARY ARTERY BYPASS GRAFT  10/17/2000   [name]. [name], [place]     AFTER LIVER TRAUMA AND HAND SURGERY  . EYE SURGERY    . HERNIA REPAIR     BILATERAL  . LEFT HEART CATH AND CORS/GRAFTS ANGIOGRAPHY N/A 04/03/2017   Procedure: LEFT HEART CATH AND CORS/GRAFTS ANGIOGRAPHY;  Surgeon: Leonie Man, MD;  Location: [facility];  Service: Cardiovascular;  Laterality: N/A;  . LUMBAR LAMINECTOMY/DECOMPRESSION MICRODISCECTOMY N/A 02/02/2016   Procedure: MICRODISCECTOMY LUMBAR FOUR- LUMBAR FIVE;  Surgeon: Ashok Pall, MD;  Location: [facility];  Service: Neurosurgery;  Laterality: N/A;  MICRODISCECTOMY L4-L5  . LUMBAR LAMINECTOMY/DECOMPRESSION MICRODISCECTOMY Right 08/02/2016   Procedure: MICRODISCECTOMY LUMBAR FOUR- LUMBAR FIVE RIGHT;  Surgeon: Ashok Pall, MD;  Location: [facility];  Service: Neurosurgery;  Laterality: Right;  . punctured eardrum      to relieve blood build up   for ear infection  . TONSILLECTOMY      There were no vitals filed for this visit.  Subjective Assessment - 04/01/19 1506    Subjective  Relays about 4/10 low back pain today, he has headache today. He did  his HEP yesteday and the day before.    Patient is accompained by:  Family member   wife   Pertinent History  monocular vertical diplopia, HTN, CAD s/p MI, CABG, neurogenic orthostatic hypotension, neuropathy, cervicogenic HA    Limitations  Standing;Walking    How long can you stand comfortably?  10 minutes    Patient Stated Goals  maintain high QOL, get back to doing household chores without pain, get out and walk, to feel safe walking/with balance in order for him and his wife to get a dog    Pain Onset  More than a month ago                       [facility] Adult PT Treatment/Exercise - 04/01/19 0001      Ambulation/Gait    Ambulation/Gait  Yes    Ambulation/Gait Assistance  6: Modified independent (Device/Increase time);5: Supervision    Ambulation Distance (Feet)  --   100x2 No AD as he forgot walking stick at home   Gait Pattern  Step-through pattern;Decreased arm swing - right;Decreased arm swing - left;Decreased stride length;Decreased step length - left;Decreased step length - right;Decreased hip/knee flexion - right;Decreased hip/knee flexion - left;Decreased trunk rotation;Trunk flexed;Wide base of support      Neuro Re-ed    Neuro Re-ed Details   balance training for standing on airex feet together, progressed to feet toghether with head turns up/down and lateral, progressed to feet apart with eyes open, progressed to SLS cone taps      Exercises   Other Exercises   lumbar stretching: low trunk rotations 10 sec X 5 reps bilat, then supine piriformis stretch 30 sec X 2 reps bilat, seated hamstring stretch 30 sec X 2 bilat. Walked up/down one set of 4 steps but this aggravated his knees so was discontinued               PT Short Term Goals - 03/19/19 1329      PT SHORT TERM GOAL #1   Title  Pt is independent with initial HEP  (ALL STG due by 04/19/2019)    Time  4    Period  Weeks    Status  New    Target Date  04/19/19      PT SHORT TERM GOAL #2   Title  Pt will participate in further balance assessments with LTG to be reset: BERG and five time sit to stand    Time  4    Period  Weeks    Status  New    Target Date  04/19/19      PT SHORT TERM GOAL #3   Title  Patient will participate in full vestibular evaluation with LTG set if needed and will initiate vestibular HEP if needed    Time  4    Period  Weeks    Status  New    Target Date  04/19/19      PT SHORT TERM GOAL #4   Title  Pt will report being able to stand for >10-15 minutes at home to perform housework such as washing dishes or do laundry with pain no >5/10    Baseline  10 minutes, 9/10    Time  4    Period  Weeks     Status  New    Target Date  04/19/19      PT SHORT TERM GOAL #5   Title  Pt will report walking around  neighborhood for up to 8-10 minutes with wife's supervision and LRAD with pain no >5/10    Time  4    Period  Weeks    Status  New    Target Date  04/19/19        PT Long Term Goals - 04/01/19 1529      PT LONG TERM GOAL #1   Title  Pt reports ability to ambulate >/= 30 minutes with LRAD without increase in pain. (all LTG target date: 05/19/19)    Baseline  03/19/19: no more than 10 minutes, uses walking stick    Time  8    Period  Weeks    Status  On-going      PT LONG TERM GOAL #2   Title  Pt demonstrates TUG </= 13 seconds to indicate lower fall risk.    Baseline  03/19/19: 17.33s    Time  8    Period  Weeks    Status  On-going      PT LONG TERM GOAL #3   Title  Pt will report being able to stand >20 minutes at home to perform household chores with pain no >3/10    Time  8    Period  Weeks    Status  On-going      PT LONG TERM GOAL #4   Title  Will improve 5xSTS to 13 seconds or less    Baseline  17    Time  8    Period  Weeks    Status  Revised      PT LONG TERM GOAL #5   Title  Will improve BERG to at least 46 to show improved balance.    Baseline  37    Time  8    Period  Weeks    Status  Revised      Additional Long Term Goals   Additional Long Term Goals  Yes      PT LONG TERM GOAL #6   Title  Pt will demonstrate 25% greater lumbar ROM into extension and side flexion    Time  8    Period  Weeks    Status  On-going      PT LONG TERM GOAL #7   Title  Vestibular goal if needed    Time  8    Period  Weeks    Status  New      PT LONG TERM GOAL #8   Title  He will improve [place] scale of kinesiophobia from 33 by 5 points to less than 28 to show less fear about pain and disability.    Status  New            Plan - 04/01/19 1513    Clinical Impression Statement  He recieved encouragement and positivie reinforcement for performing his HEP.  [place] scale of kinesiophobia filled out today by patient, he scored 33/64 which places him at a moderate level of fear about his pain and disabillity. Session continued to focus on lumbar stretching, balance, and overall endurance to tolerance. He does get short of breath and needs rest breaks, O2 sats did not get below 92% and HR did not get above 115 today with activity. New goal was written today to improve his [place] score by at least 5 points to show less fear reguarding pain and activity. Continue POC    Personal Factors and Comorbidities  Age;Comorbidity 3+;Behavior Pattern;Fitness;Past/Current Experience;Time since onset of injury/illness/exacerbation    Comorbidities  monocular vertical diplopia, HTN, CAD s/p MI, CABG, neurogenic orthostatic hypotension, neuropathy, cervicogenic HA, microdiscectomy (2017, 2018)    Examination-Activity Limitations  Locomotion Level;Stairs;Stand    Examination-Participation Restrictions  Cleaning;Community Activity;Laundry;Yard Work    [employer]  Evolving/Moderate complexity    Rehab Potential  Fair   pt stated not wanting to do exercises at home   PT Frequency  2x / week    PT Duration  8 weeks    PT Treatment/Interventions  ADLs/Self Care Home Management;Canalith Repostioning;Gait training;Stair training;Therapeutic activities;Therapeutic exercise;Balance training;Patient/family education;Neuromuscular re-education;Manual techniques;Vestibular;Electrical Stimulation;Moist Heat;DME Instruction;Functional mobility training;Passive range of motion;Dry needling;Spinal Manipulations;Cryotherapy    PT Next Visit Plan  Audra to perform vestibular evaluation. How is HEP going and update/revise PRN, continue with lumbar stretching, general strength and endurace, and dynamic balance    Consulted and Agree with Plan of Care  Patient;Family member/caregiver    Family Member Consulted  --       Patient will benefit from skilled therapeutic  intervention in order to improve the following deficits and impairments:  Abnormal gait, Decreased range of motion, Difficulty walking, Dizziness, Decreased activity tolerance, Decreased endurance, Pain, Decreased balance, Decreased strength, Decreased mobility, Impaired flexibility, Impaired sensation  Visit Diagnosis: Chronic midline low back pain without sciatica  Other abnormalities of gait and mobility  Muscle weakness (generalized)  Unsteadiness on feet  Dizziness and giddiness  Repeated falls     Problem List Patient Active Problem List   Diagnosis Date Noted  . Personal history of malignant melanoma of skin   . Lumbar radiculopathy   . Heart murmur   . GERD (gastroesophageal reflux disease)   . Diminished hearing, bilateral   . Cataracts, bilateral   . Bladder neck obstruction   . Arthritis   . Constipation 12/12/2016  . Nausea 12/12/2016  . Coronary artery disease involving native coronary artery of native heart without angina pectoris 11/22/2016  . Dyslipidemia 11/22/2016  . History of coronary artery bypass surgery 11/22/2016  . Depression with anxiety   . Orthostasis 09/20/2016  . HNP (herniated nucleus pulposus), lumbar 02/02/2016  . Spinal stenosis of lumbar region 01/24/2016  . Sciatica, right side 01/24/2016  . Prediabetes 01/24/2016  . Otitis, externa, infective 01/24/2016  . Left lumbar radiculopathy 01/15/2016  . PVC's (premature ventricular contractions) 05/24/2015  . Peripheral polyneuropathy 03/06/2015  . BPPV (benign paroxysmal positional vertigo) 03/06/2015  . Headache 01/18/2015  . Vertigo 01/18/2015  . Allergic rhinitis 09/20/2014  . Dyspnea on exertion 08/10/2014  . Severe dizziness 08/10/2014  . Acute diverticulitis 05/18/2014  . Diarrhea 05/18/2014  . LLQ pain 05/13/2014  . Chronic meniscal tear of knee 09/22/2013  . Primary localized osteoarthrosis, lower leg 09/22/2013  . Neck pain, bilateral 12/18/2012  . Headache(784.0)  12/18/2012  . Binocular visual disturbance 12/18/2012  . Epiretinal membrane 12/17/2012  . Status post intraocular lens implant 12/17/2012  . Preventative health care 12/02/2012  . Low back pain 12/02/2012  . Impaired glucose tolerance 12/02/2012  . Palpitations 10/09/2010  . B12 deficiency 08/30/2010  . Hearing loss 08/30/2010  . Tinnitus 08/30/2010  . Neurogenic orthostatic hypotension ([place]) 06/28/2009  . HYPERLIPIDEMIA TYPE IIB / III 05/15/2009  . CHEST PAIN UNSPECIFIED 11/23/2008  . Hypotension, unspecified 10/12/2008  . DIZZINESS 10/12/2008  . Shortness of breath 09/14/2008  . UNSPECIFIED ANEMIA 09/13/2008  . ANXIETY DEPRESSION 09/13/2008  . Old myocardial infarction 09/13/2008  . GASTROESOPHAGEAL REFLUX DISEASE, HX OF 09/13/2008  . LUMBAR RADICULOPATHY, LEFT 08/23/2008  . MELANOMA, SHOULDER 11/27/2006  .  Essential hypertension 11/27/2006  . Coronary atherosclerosis 11/27/2006  . BPH associated with nocturia 11/27/2006  . ERECTILE DYSFUNCTION, ORGANIC 11/27/2006    Silvestre Mesi 04/01/2019, 3:31 PM  [facility] [address] [place], [place], [address] Phone: [phone]   Fax:  [phone]  Name: ARSENE DESHAZIER MRN: [id number] Date of Birth: 08-31-1931

## 2019-04-01 NOTE — Therapy (Deleted)
[facility] [address] [place], [place], [address] Phone: [phone]   Fax:  [phone]  Physical Therapy Treatment  Patient Details  Name: Troy Combs MRN: [id number] Date of Birth: 01/15/1932 Referring Provider (PT): Antony Contras MD    Encounter Date: 04/01/2019  PT End of Session - 04/01/19 1511    Visit Number  3    Number of Visits  17    Date for PT Re-Evaluation  05/19/19    Authorization Type  [employer] Medicare - 10th visit PN    PT Start Time  1413   pt 13 min late for his appointment   PT Stop Time  1446    PT Time Calculation (min)  33 min    Activity Tolerance  Patient tolerated treatment well    Behavior During Therapy  [facility] for tasks assessed/performed       Past Medical History:  Diagnosis Date  . Allergic rhinitis 09/20/2014  . Anemia, unspecified   . Arthritis   . Bladder neck obstruction   . Cataracts, bilateral   . Coronary atherosclerosis of unspecified type of vessel, native or graft   . Diminished hearing, bilateral   . Dysthymic disorder   . GERD (gastroesophageal reflux disease)   . Heart murmur    as a child  . Impaired glucose tolerance 12/02/2012  . Impotence of organic origin   . Lumbar radiculopathy   . Malignant melanoma of skin of upper limb, including shoulder ([place])   . Old myocardial infarction   . Personal history of malignant melanoma of skin   . Personal history of other diseases of digestive system   . Thoracic or lumbosacral neuritis or radiculitis, unspecified   . Unspecified essential hypertension     Past Surgical History:  Procedure Laterality Date  . ARTHRODESIS  07/06/2002   of left long finger distal interphalangeal joint with Kirschner wire fixation X 3  . ATRIAL ABLATION SURGERY  05/04/2001   Dr. [name]  . CARDIAC CATHETERIZATION  09/05/2008   Revealing 4 of 4 patent grafts with native multivessel coronary artery disease, EF  of 60% without  regional wall motion abnormalities.  Marland Kitchen CATARACT EXTRACTION, BILATERAL    . CHOLECYSTECTOMY    . COLONOSCOPY    . CORONARY ANGIOPLASTY  1993  . CORONARY ARTERY BYPASS GRAFT  10/17/2000   [name]. [name], [place]     AFTER LIVER TRAUMA AND HAND SURGERY  . EYE SURGERY    . HERNIA REPAIR     BILATERAL  . LEFT HEART CATH AND CORS/GRAFTS ANGIOGRAPHY N/A 04/03/2017   Procedure: LEFT HEART CATH AND CORS/GRAFTS ANGIOGRAPHY;  Surgeon: Leonie Man, MD;  Location: [facility];  Service: Cardiovascular;  Laterality: N/A;  . LUMBAR LAMINECTOMY/DECOMPRESSION MICRODISCECTOMY N/A 02/02/2016   Procedure: MICRODISCECTOMY LUMBAR FOUR- LUMBAR FIVE;  Surgeon: Ashok Pall, MD;  Location: [facility];  Service: Neurosurgery;  Laterality: N/A;  MICRODISCECTOMY L4-L5  . LUMBAR LAMINECTOMY/DECOMPRESSION MICRODISCECTOMY Right 08/02/2016   Procedure: MICRODISCECTOMY LUMBAR FOUR- LUMBAR FIVE RIGHT;  Surgeon: Ashok Pall, MD;  Location: [facility];  Service: Neurosurgery;  Laterality: Right;  . punctured eardrum      to relieve blood build up   for ear infection  . TONSILLECTOMY      There were no vitals filed for this visit.  Subjective Assessment - 04/01/19 1506    Subjective  Relays about 4/10 low back pain today, he has headache today. He did  his HEP yesteday and the day before.    Patient is accompained by:  Family member   wife   Pertinent History  monocular vertical diplopia, HTN, CAD s/p MI, CABG, neurogenic orthostatic hypotension, neuropathy, cervicogenic HA    Limitations  Standing;Walking    How long can you stand comfortably?  10 minutes    Patient Stated Goals  maintain high QOL, get back to doing household chores without pain, get out and walk, to feel safe walking/with balance in order for him and his wife to get a dog    Pain Onset  More than a month ago                       [facility] Adult PT Treatment/Exercise - 04/01/19 0001      Ambulation/Gait    Ambulation/Gait  Yes    Ambulation/Gait Assistance  6: Modified independent (Device/Increase time);5: Supervision    Ambulation Distance (Feet)  --   100x2 No AD as he forgot walking stick at home   Gait Pattern  Step-through pattern;Decreased arm swing - right;Decreased arm swing - left;Decreased stride length;Decreased step length - left;Decreased step length - right;Decreased hip/knee flexion - right;Decreased hip/knee flexion - left;Decreased trunk rotation;Trunk flexed;Wide base of support      Neuro Re-ed    Neuro Re-ed Details   balance training for standing on airex feet together, progressed to feet toghether with head turns up/down and lateral, progressed to feet apart with eyes open, progressed to SLS cone taps      Exercises   Other Exercises   lumbar stretching: low trunk rotations 10 sec X 5 reps bilat, then supine piriformis stretch 30 sec X 2 reps bilat, seated hamstring stretch 30 sec X 2 bilat. Walked up/down one set of 4 steps but this aggravated his knees so was discontinued               PT Short Term Goals - 03/19/19 1329      PT SHORT TERM GOAL #1   Title  Pt is independent with initial HEP  (ALL STG due by 04/19/2019)    Time  4    Period  Weeks    Status  New    Target Date  04/19/19      PT SHORT TERM GOAL #2   Title  Pt will participate in further balance assessments with LTG to be reset: BERG and five time sit to stand    Time  4    Period  Weeks    Status  New    Target Date  04/19/19      PT SHORT TERM GOAL #3   Title  Patient will participate in full vestibular evaluation with LTG set if needed and will initiate vestibular HEP if needed    Time  4    Period  Weeks    Status  New    Target Date  04/19/19      PT SHORT TERM GOAL #4   Title  Pt will report being able to stand for >10-15 minutes at home to perform housework such as washing dishes or do laundry with pain no >5/10    Baseline  10 minutes, 9/10    Time  4    Period  Weeks     Status  New    Target Date  04/19/19      PT SHORT TERM GOAL #5   Title  Pt will report walking around  neighborhood for up to 8-10 minutes with wife's supervision and LRAD with pain no >5/10    Time  4    Period  Weeks    Status  New    Target Date  04/19/19        PT Long Term Goals - 03/30/19 1508      PT LONG TERM GOAL #1   Title  Pt reports ability to ambulate >/= 30 minutes with LRAD without increase in pain. (all LTG target date: 05/19/19)    Baseline  03/19/19: no more than 10 minutes, uses walking stick    Time  8    Period  Weeks    Status  On-going      PT LONG TERM GOAL #2   Title  Pt demonstrates TUG </= 13 seconds to indicate lower fall risk.    Baseline  03/19/19: 17.33s    Time  8    Period  Weeks    Status  On-going      PT LONG TERM GOAL #3   Title  Pt will report being able to stand >20 minutes at home to perform household chores with pain no >3/10    Time  8    Period  Weeks    Status  On-going      PT LONG TERM GOAL #4   Title  Will improve 5xSTS to 13 seconds or less    Baseline  17    Time  8    Period  Weeks    Status  Revised      PT LONG TERM GOAL #5   Title  Will improve BERG to at least 46 to show improved balance.    Baseline  37    Time  8    Period  Weeks    Status  Revised      PT LONG TERM GOAL #6   Title  Pt will demonstrate 25% greater lumbar ROM into extension and side flexion    Time  8    Period  Weeks    Status  On-going      PT LONG TERM GOAL #7   Title  Vestibular goal if needed    Time  8    Period  Weeks    Status  New            Plan - 04/01/19 1513    Clinical Impression Statement  He recieved encouragement and positivie reinforcement for performing his HEP. [place] scale of kinesiophobia filled out today by patient, he scored 33/64 which places him at a moderate level of fear about his pain and disabillity. Session continued to focus on lumbar stretching, balance, and overall endurance to tolerance. He does  get short of breath and needs rest breaks, O2 sats did not get below 92% and HR did not get above 115 today with activity. New goal was written today to improve his [place] score by at least 5 points to show less fear reguarding pain and activity. Continue POC    Personal Factors and Comorbidities  Age;Comorbidity 3+;Behavior Pattern;Fitness;Past/Current Experience;Time since onset of injury/illness/exacerbation    Comorbidities  monocular vertical diplopia, HTN, CAD s/p MI, CABG, neurogenic orthostatic hypotension, neuropathy, cervicogenic HA, microdiscectomy (2017, 2018)    Examination-Activity Limitations  Locomotion Level;Stairs;Stand    Examination-Participation Restrictions  Cleaning;Community Activity;Laundry;Yard Work    Stability/Clinical Decision Making  Evolving/Moderate complexity    Rehab Potential  Fair   pt stated not wanting to do exercises at  home   PT Frequency  2x / week    PT Duration  8 weeks    PT Treatment/Interventions  ADLs/Self Care Home Management;Canalith Repostioning;Gait training;Stair training;Therapeutic activities;Therapeutic exercise;Balance training;Patient/family education;Neuromuscular re-education;Manual techniques;Vestibular;Electrical Stimulation;Moist Heat;DME Instruction;Functional mobility training;Passive range of motion;Dry needling;Spinal Manipulations;Cryotherapy    PT Next Visit Plan  Audra to perform vestibular evaluation. How is HEP going and update/revise PRN, continue with lumbar stretching, general strength and endurace, and dynamic balance    Consulted and Agree with Plan of Care  Patient;Family member/caregiver    Family Member Consulted  --       Patient will benefit from skilled therapeutic intervention in order to improve the following deficits and impairments:  Abnormal gait, Decreased range of motion, Difficulty walking, Dizziness, Decreased activity tolerance, Decreased endurance, Pain, Decreased balance, Decreased strength, Decreased  mobility, Impaired flexibility, Impaired sensation  Visit Diagnosis: Chronic midline low back pain without sciatica  Other abnormalities of gait and mobility  Muscle weakness (generalized)  Unsteadiness on feet  Dizziness and giddiness  Repeated falls     Problem List Patient Active Problem List   Diagnosis Date Noted  . Personal history of malignant melanoma of skin   . Lumbar radiculopathy   . Heart murmur   . GERD (gastroesophageal reflux disease)   . Diminished hearing, bilateral   . Cataracts, bilateral   . Bladder neck obstruction   . Arthritis   . Constipation 12/12/2016  . Nausea 12/12/2016  . Coronary artery disease involving native coronary artery of native heart without angina pectoris 11/22/2016  . Dyslipidemia 11/22/2016  . History of coronary artery bypass surgery 11/22/2016  . Depression with anxiety   . Orthostasis 09/20/2016  . HNP (herniated nucleus pulposus), lumbar 02/02/2016  . Spinal stenosis of lumbar region 01/24/2016  . Sciatica, right side 01/24/2016  . Prediabetes 01/24/2016  . Otitis, externa, infective 01/24/2016  . Left lumbar radiculopathy 01/15/2016  . PVC's (premature ventricular contractions) 05/24/2015  . Peripheral polyneuropathy 03/06/2015  . BPPV (benign paroxysmal positional vertigo) 03/06/2015  . Headache 01/18/2015  . Vertigo 01/18/2015  . Allergic rhinitis 09/20/2014  . Dyspnea on exertion 08/10/2014  . Severe dizziness 08/10/2014  . Acute diverticulitis 05/18/2014  . Diarrhea 05/18/2014  . LLQ pain 05/13/2014  . Chronic meniscal tear of knee 09/22/2013  . Primary localized osteoarthrosis, lower leg 09/22/2013  . Neck pain, bilateral 12/18/2012  . Headache(784.0) 12/18/2012  . Binocular visual disturbance 12/18/2012  . Epiretinal membrane 12/17/2012  . Status post intraocular lens implant 12/17/2012  . Preventative health care 12/02/2012  . Low back pain 12/02/2012  . Impaired glucose tolerance 12/02/2012  .  Palpitations 10/09/2010  . B12 deficiency 08/30/2010  . Hearing loss 08/30/2010  . Tinnitus 08/30/2010  . Neurogenic orthostatic hypotension ([place]) 06/28/2009  . HYPERLIPIDEMIA TYPE IIB / III 05/15/2009  . CHEST PAIN UNSPECIFIED 11/23/2008  . Hypotension, unspecified 10/12/2008  . DIZZINESS 10/12/2008  . Shortness of breath 09/14/2008  . UNSPECIFIED ANEMIA 09/13/2008  . ANXIETY DEPRESSION 09/13/2008  . Old myocardial infarction 09/13/2008  . GASTROESOPHAGEAL REFLUX DISEASE, HX OF 09/13/2008  . LUMBAR RADICULOPATHY, LEFT 08/23/2008  . MELANOMA, SHOULDER 11/27/2006  . Essential hypertension 11/27/2006  . Coronary atherosclerosis 11/27/2006  . BPH associated with nocturia 11/27/2006  . ERECTILE DYSFUNCTION, ORGANIC 11/27/2006    Silvestre Mesi 04/01/2019, 3:28 PM  [facility] [address] [place], [place], [address] Phone: [phone]   Fax:  [phone]  Name: JACKSON GONYER MRN: [id number] Date  of Birth: 07/05/1931

## 2019-04-06 ENCOUNTER — Ambulatory Visit: Payer: Medicare Other | Admitting: Physical Therapy

## 2019-04-06 ENCOUNTER — Other Ambulatory Visit: Payer: Self-pay

## 2019-04-06 DIAGNOSIS — M545 Low back pain, unspecified: Secondary | ICD-10-CM

## 2019-04-06 DIAGNOSIS — G8929 Other chronic pain: Secondary | ICD-10-CM

## 2019-04-06 DIAGNOSIS — R296 Repeated falls: Secondary | ICD-10-CM

## 2019-04-06 DIAGNOSIS — R2689 Other abnormalities of gait and mobility: Secondary | ICD-10-CM

## 2019-04-06 DIAGNOSIS — M6281 Muscle weakness (generalized): Secondary | ICD-10-CM

## 2019-04-06 DIAGNOSIS — R42 Dizziness and giddiness: Secondary | ICD-10-CM

## 2019-04-06 DIAGNOSIS — R2681 Unsteadiness on feet: Secondary | ICD-10-CM

## 2019-04-06 NOTE — Therapy (Signed)
[facility] [address] [place], [place], [address] Phone: [phone]   Fax:  [phone]  Physical Therapy Treatment  Patient Details  Name: Troy Combs MRN: [id number] Date of Birth: 07/18/1931 Referring Provider (PT): Antony Contras MD    Encounter Date: 04/06/2019  PT End of Session - 04/06/19 1543    Visit Number  4    Number of Visits  17    Date for PT Re-Evaluation  05/19/19    Authorization Type  [employer] Medicare - 10th visit PN    PT Start Time  1400    PT Stop Time  1440    PT Time Calculation (min)  40 min    Activity Tolerance  Patient tolerated treatment well    Behavior During Therapy  [facility] for tasks assessed/performed       Past Medical History:  Diagnosis Date  . Allergic rhinitis 09/20/2014  . Anemia, unspecified   . Arthritis   . Bladder neck obstruction   . Cataracts, bilateral   . Coronary atherosclerosis of unspecified type of vessel, native or graft   . Diminished hearing, bilateral   . Dysthymic disorder   . GERD (gastroesophageal reflux disease)   . Heart murmur    as a child  . Impaired glucose tolerance 12/02/2012  . Impotence of organic origin   . Lumbar radiculopathy   . Malignant melanoma of skin of upper limb, including shoulder ([place])   . Old myocardial infarction   . Personal history of malignant melanoma of skin   . Personal history of other diseases of digestive system   . Thoracic or lumbosacral neuritis or radiculitis, unspecified   . Unspecified essential hypertension     Past Surgical History:  Procedure Laterality Date  . ARTHRODESIS  07/06/2002   of left long finger distal interphalangeal joint with Kirschner wire fixation X 3  . ATRIAL ABLATION SURGERY  05/04/2001   Dr. [name]  . CARDIAC CATHETERIZATION  09/05/2008   Revealing 4 of 4 patent grafts with native multivessel coronary artery disease, EF  of 60% without regional wall motion abnormalities.  Marland Kitchen  CATARACT EXTRACTION, BILATERAL    . CHOLECYSTECTOMY    . COLONOSCOPY    . CORONARY ANGIOPLASTY  1993  . CORONARY ARTERY BYPASS GRAFT  10/17/2000   [name]. [name], [place]     AFTER LIVER TRAUMA AND HAND SURGERY  . EYE SURGERY    . HERNIA REPAIR     BILATERAL  . LEFT HEART CATH AND CORS/GRAFTS ANGIOGRAPHY N/A 04/03/2017   Procedure: LEFT HEART CATH AND CORS/GRAFTS ANGIOGRAPHY;  Surgeon: Leonie Man, MD;  Location: [facility];  Service: Cardiovascular;  Laterality: N/A;  . LUMBAR LAMINECTOMY/DECOMPRESSION MICRODISCECTOMY N/A 02/02/2016   Procedure: MICRODISCECTOMY LUMBAR FOUR- LUMBAR FIVE;  Surgeon: Ashok Pall, MD;  Location: [facility];  Service: Neurosurgery;  Laterality: N/A;  MICRODISCECTOMY L4-L5  . LUMBAR LAMINECTOMY/DECOMPRESSION MICRODISCECTOMY Right 08/02/2016   Procedure: MICRODISCECTOMY LUMBAR FOUR- LUMBAR FIVE RIGHT;  Surgeon: Ashok Pall, MD;  Location: [facility];  Service: Neurosurgery;  Laterality: Right;  . punctured eardrum      to relieve blood build up   for ear infection  . TONSILLECTOMY      There were no vitals filed for this visit.  Subjective Assessment - 04/06/19 1518    Subjective  relays 7/10 low back pain, relays he does his HEP in the mornings most days now. He says he wants to  try upwalker to help him with endurance, posture, and to see                       [facility] Adult PT Treatment/Exercise - 04/06/19 0001      Ambulation/Gait   Ambulation/Gait  Yes    Ambulation/Gait Assistance  5: Supervision    Ambulation Distance (Feet)  345 Feet   no AD as he forgot walking stick again, became SOB    Gait Pattern  Step-through pattern;Decreased arm swing - right;Decreased arm swing - left;Decreased stride length;Decreased step length - left;Decreased step length - right;Decreased hip/knee flexion - right;Decreased hip/knee flexion - left;Decreased trunk rotation;Trunk flexed;Wide base of support    Gait Comments  also  walked another 230 ft with trial of upwalker he was able to show improved posture and stabilty with good understanding of how to operate breaks, showed good manueverability with turning.      Exercises   Other Exercises   lumbar stretching: low trunk rotations 10 sec X 5 reps bilat, then supine piriformis stretch 30 sec X 2 reps bilat, sidelying thoracic rotations X 5 reps each side, prone lying and prone on elbows      Manual Therapy   Manual therapy comments  gentle long axis distraction bilat, prone lumbar central PA mobs grade 1, STM to lumbar Paraspinals.                 PT Short Term Goals - 03/19/19 1329      PT SHORT TERM GOAL #1   Title  Pt is independent with initial HEP  (ALL STG due by 04/19/2019)    Time  4    Period  Weeks    Status  New    Target Date  04/19/19      PT SHORT TERM GOAL #2   Title  Pt will participate in further balance assessments with LTG to be reset: BERG and five time sit to stand    Time  4    Period  Weeks    Status  New    Target Date  04/19/19      PT SHORT TERM GOAL #3   Title  Patient will participate in full vestibular evaluation with LTG set if needed and will initiate vestibular HEP if needed    Time  4    Period  Weeks    Status  New    Target Date  04/19/19      PT SHORT TERM GOAL #4   Title  Pt will report being able to stand for >10-15 minutes at home to perform housework such as washing dishes or do laundry with pain no >5/10    Baseline  10 minutes, 9/10    Time  4    Period  Weeks    Status  New    Target Date  04/19/19      PT SHORT TERM GOAL #5   Title  Pt will report walking around neighborhood for up to 8-10 minutes with wife's supervision and LRAD with pain no >5/10    Time  4    Period  Weeks    Status  New    Target Date  04/19/19        PT Long Term Goals - 04/01/19 1529      PT LONG TERM GOAL #1   Title  Pt reports ability to ambulate >/= 30 minutes with LRAD without increase in pain. (all LTG  target date: 05/19/19)    Baseline  03/19/19: no more than 10 minutes, uses walking stick    Time  8    Period  Weeks    Status  On-going      PT LONG TERM GOAL #2   Title  Pt demonstrates TUG </= 13 seconds to indicate lower fall risk.    Baseline  03/19/19: 17.33s    Time  8    Period  Weeks    Status  On-going      PT LONG TERM GOAL #3   Title  Pt will report being able to stand >20 minutes at home to perform household chores with pain no >3/10    Time  8    Period  Weeks    Status  On-going      PT LONG TERM GOAL #4   Title  Will improve 5xSTS to 13 seconds or less    Baseline  17    Time  8    Period  Weeks    Status  Revised      PT LONG TERM GOAL #5   Title  Will improve BERG to at least 46 to show improved balance.    Baseline  37    Time  8    Period  Weeks    Status  Revised      Additional Long Term Goals   Additional Long Term Goals  Yes      PT LONG TERM GOAL #6   Title  Pt will demonstrate 25% greater lumbar ROM into extension and side flexion    Time  8    Period  Weeks    Status  On-going      PT LONG TERM GOAL #7   Title  Vestibular goal if needed    Time  8    Period  Weeks    Status  New      PT LONG TERM GOAL #8   Title  He will improve [place] scale of kinesiophobia from 33 by 5 points to less than 28 to show less fear about pain and disability.    Status  New            Plan - 04/06/19 1543    Clinical Impression Statement  Session focused on reducing overall back pain with stretching and manual therapy and he did report overall pain reduction by end of session. At his request he was trialed on upwalker today and he is able to show good technique and manueverability with upwalker which may allow him to ambulate further with less pain. PT will continue to evaluate need for AD during future sessions and PT will add in more balance training next time.    Personal Factors and Comorbidities  Age;Comorbidity 3+;Behavior  Pattern;Fitness;Past/Current Experience;Time since onset of injury/illness/exacerbation    Comorbidities  monocular vertical diplopia, HTN, CAD s/p MI, CABG, neurogenic orthostatic hypotension, neuropathy, cervicogenic HA, microdiscectomy (2017, 2018)    Examination-Activity Limitations  Locomotion Level;Stairs;Stand    Examination-Participation Restrictions  Cleaning;Community Activity;Laundry;Yard Work    [employer]  Evolving/Moderate complexity    Rehab Potential  Fair   pt stated not wanting to do exercises at home   PT Frequency  2x / week    PT Duration  8 weeks    PT Treatment/Interventions  ADLs/Self Care Home Management;Canalith Repostioning;Gait training;Stair training;Therapeutic activities;Therapeutic exercise;Balance training;Patient/family education;Neuromuscular re-education;Manual techniques;Vestibular;Electrical Stimulation;Moist Heat;DME Instruction;Functional mobility training;Passive range of motion;Dry needling;Spinal Manipulations;Cryotherapy  PT Next Visit Plan  Audra to perform vestibular evaluation. Continue with lumbar stretching, general strength and endurace, and dynamic balance    Consulted and Agree with Plan of Care  Patient;Family member/caregiver       Patient will benefit from skilled therapeutic intervention in order to improve the following deficits and impairments:  Abnormal gait, Decreased range of motion, Difficulty walking, Dizziness, Decreased activity tolerance, Decreased endurance, Pain, Decreased balance, Decreased strength, Decreased mobility, Impaired flexibility, Impaired sensation  Visit Diagnosis: Chronic midline low back pain without sciatica  Other abnormalities of gait and mobility  Muscle weakness (generalized)  Unsteadiness on feet  Dizziness and giddiness  Repeated falls     Problem List Patient Active Problem List   Diagnosis Date Noted  . Personal history of malignant melanoma of skin   . Lumbar  radiculopathy   . Heart murmur   . GERD (gastroesophageal reflux disease)   . Diminished hearing, bilateral   . Cataracts, bilateral   . Bladder neck obstruction   . Arthritis   . Constipation 12/12/2016  . Nausea 12/12/2016  . Coronary artery disease involving native coronary artery of native heart without angina pectoris 11/22/2016  . Dyslipidemia 11/22/2016  . History of coronary artery bypass surgery 11/22/2016  . Depression with anxiety   . Orthostasis 09/20/2016  . HNP (herniated nucleus pulposus), lumbar 02/02/2016  . Spinal stenosis of lumbar region 01/24/2016  . Sciatica, right side 01/24/2016  . Prediabetes 01/24/2016  . Otitis, externa, infective 01/24/2016  . Left lumbar radiculopathy 01/15/2016  . PVC's (premature ventricular contractions) 05/24/2015  . Peripheral polyneuropathy 03/06/2015  . BPPV (benign paroxysmal positional vertigo) 03/06/2015  . Headache 01/18/2015  . Vertigo 01/18/2015  . Allergic rhinitis 09/20/2014  . Dyspnea on exertion 08/10/2014  . Severe dizziness 08/10/2014  . Acute diverticulitis 05/18/2014  . Diarrhea 05/18/2014  . LLQ pain 05/13/2014  . Chronic meniscal tear of knee 09/22/2013  . Primary localized osteoarthrosis, lower leg 09/22/2013  . Neck pain, bilateral 12/18/2012  . Headache(784.0) 12/18/2012  . Binocular visual disturbance 12/18/2012  . Epiretinal membrane 12/17/2012  . Status post intraocular lens implant 12/17/2012  . Preventative health care 12/02/2012  . Low back pain 12/02/2012  . Impaired glucose tolerance 12/02/2012  . Palpitations 10/09/2010  . B12 deficiency 08/30/2010  . Hearing loss 08/30/2010  . Tinnitus 08/30/2010  . Neurogenic orthostatic hypotension ([place]) 06/28/2009  . HYPERLIPIDEMIA TYPE IIB / III 05/15/2009  . CHEST PAIN UNSPECIFIED 11/23/2008  . Hypotension, unspecified 10/12/2008  . DIZZINESS 10/12/2008  . Shortness of breath 09/14/2008  . UNSPECIFIED ANEMIA 09/13/2008  . ANXIETY DEPRESSION  09/13/2008  . Old myocardial infarction 09/13/2008  . GASTROESOPHAGEAL REFLUX DISEASE, HX OF 09/13/2008  . LUMBAR RADICULOPATHY, LEFT 08/23/2008  . MELANOMA, SHOULDER 11/27/2006  . Essential hypertension 11/27/2006  . Coronary atherosclerosis 11/27/2006  . BPH associated with nocturia 11/27/2006  . ERECTILE DYSFUNCTION, ORGANIC 11/27/2006    Silvestre Mesi 04/06/2019, 3:48 PM  [facility] [address] [place], [place], [address] Phone: [phone]   Fax:  [phone]  Name: CRISTOFER RATHGEBER MRN: [id number] Date of Birth: Oct 26, 1931

## 2019-04-08 ENCOUNTER — Ambulatory Visit: Payer: Medicare Other | Admitting: Physical Therapy

## 2019-04-08 ENCOUNTER — Other Ambulatory Visit: Payer: Self-pay

## 2019-04-08 DIAGNOSIS — M545 Low back pain, unspecified: Secondary | ICD-10-CM

## 2019-04-08 DIAGNOSIS — R296 Repeated falls: Secondary | ICD-10-CM

## 2019-04-08 DIAGNOSIS — R2689 Other abnormalities of gait and mobility: Secondary | ICD-10-CM

## 2019-04-08 DIAGNOSIS — M6281 Muscle weakness (generalized): Secondary | ICD-10-CM

## 2019-04-08 DIAGNOSIS — R42 Dizziness and giddiness: Secondary | ICD-10-CM

## 2019-04-08 DIAGNOSIS — R2681 Unsteadiness on feet: Secondary | ICD-10-CM

## 2019-04-08 DIAGNOSIS — G8929 Other chronic pain: Secondary | ICD-10-CM

## 2019-04-08 NOTE — Therapy (Signed)
[facility] [address] [place], [place], [address] Phone: [phone]   Fax:  [phone]  Physical Therapy Treatment  Patient Details  Name: Troy Combs MRN: [id number] Date of Birth: 29-Mar-1932 Referring Provider (PT): Antony Contras MD    Encounter Date: 04/08/2019  PT End of Session - 04/08/19 2130    Visit Number  5    Number of Visits  17    Date for PT Re-Evaluation  05/19/19    Authorization Type  [employer] Medicare - 10th visit PN    PT Start Time  1400    PT Stop Time  1440    PT Time Calculation (min)  40 min    Activity Tolerance  Patient tolerated treatment well    Behavior During Therapy  [facility] for tasks assessed/performed       Past Medical History:  Diagnosis Date  . Allergic rhinitis 09/20/2014  . Anemia, unspecified   . Arthritis   . Bladder neck obstruction   . Cataracts, bilateral   . Coronary atherosclerosis of unspecified type of vessel, native or graft   . Diminished hearing, bilateral   . Dysthymic disorder   . GERD (gastroesophageal reflux disease)   . Heart murmur    as a child  . Impaired glucose tolerance 12/02/2012  . Impotence of organic origin   . Lumbar radiculopathy   . Malignant melanoma of skin of upper limb, including shoulder ([place])   . Old myocardial infarction   . Personal history of malignant melanoma of skin   . Personal history of other diseases of digestive system   . Thoracic or lumbosacral neuritis or radiculitis, unspecified   . Unspecified essential hypertension     Past Surgical History:  Procedure Laterality Date  . ARTHRODESIS  07/06/2002   of left long finger distal interphalangeal joint with Kirschner wire fixation X 3  . ATRIAL ABLATION SURGERY  05/04/2001   Dr. [name]  . CARDIAC CATHETERIZATION  09/05/2008   Revealing 4 of 4 patent grafts with native multivessel coronary artery disease, EF  of 60% without regional wall motion abnormalities.  Marland Kitchen  CATARACT EXTRACTION, BILATERAL    . CHOLECYSTECTOMY    . COLONOSCOPY    . CORONARY ANGIOPLASTY  1993  . CORONARY ARTERY BYPASS GRAFT  10/17/2000   [name]. [name], [place]     AFTER LIVER TRAUMA AND HAND SURGERY  . EYE SURGERY    . HERNIA REPAIR     BILATERAL  . LEFT HEART CATH AND CORS/GRAFTS ANGIOGRAPHY N/A 04/03/2017   Procedure: LEFT HEART CATH AND CORS/GRAFTS ANGIOGRAPHY;  Surgeon: Leonie Man, MD;  Location: [facility];  Service: Cardiovascular;  Laterality: N/A;  . LUMBAR LAMINECTOMY/DECOMPRESSION MICRODISCECTOMY N/A 02/02/2016   Procedure: MICRODISCECTOMY LUMBAR FOUR- LUMBAR FIVE;  Surgeon: Ashok Pall, MD;  Location: [facility];  Service: Neurosurgery;  Laterality: N/A;  MICRODISCECTOMY L4-L5  . LUMBAR LAMINECTOMY/DECOMPRESSION MICRODISCECTOMY Right 08/02/2016   Procedure: MICRODISCECTOMY LUMBAR FOUR- LUMBAR FIVE RIGHT;  Surgeon: Ashok Pall, MD;  Location: [facility];  Service: Neurosurgery;  Laterality: Right;  . punctured eardrum      to relieve blood build up   for ear infection  . TONSILLECTOMY      There were no vitals filed for this visit.  Subjective Assessment - 04/08/19 2122    Subjective  His low back pain has improved, the exercises and PT sessions are helping with his back pain. Does complain of headache  today and dizziness when he goes from supine to sit but does not get dizzy when rolling over. He also complains of 5/10 knee pain and soreness and does not feel like doing many standing exercises today.    Patient is accompained by:  Family member   wife   Pertinent History  monocular vertical diplopia, HTN, CAD s/p MI, CABG, neurogenic orthostatic hypotension, neuropathy, cervicogenic HA    Limitations  Standing;Walking    How long can you stand comfortably?  10 minutes    Patient Stated Goals  maintain high QOL, get back to doing household chores without pain, get out and walk, to feel safe walking/with balance in order for him and his  wife to get a dog    Pain Onset  More than a month ago                       [facility] Adult PT Treatment/Exercise - 04/08/19 0001      Ambulation/Gait   Ambulation/Gait  Yes    Ambulation/Gait Assistance  6: Modified independent (Device/Increase time)    Gait Comments  at his request wanted to trial upwalker again. he shows good stability and safe proper use of upwalker relays it helps him walk with less pain, ambulated 345 ft and then worked on making 180 deg turns around cone with upwalker and shows good ability to turn mod I.       Exercises   Other Exercises   lumbar stretching: low trunk rotations 10 sec X 5 reps bilat, then supine piriformis stretch 30 sec X 2 reps bilat, sidelying thoracic rotations X 5 reps each side. Supine leg/core exercises: marching with TAC with isometric hold on opposite side alternating X 10 reps bilat, then clams with green band X 20 reps, then bridging X 15 reps.       Manual Therapy   Manual therapy comments  gentle long axis distraction bilat both with hip IR and ER               PT Short Term Goals - 03/19/19 1329      PT SHORT TERM GOAL #1   Title  Pt is independent with initial HEP  (ALL STG due by 04/19/2019)    Time  4    Period  Weeks    Status  New    Target Date  04/19/19      PT SHORT TERM GOAL #2   Title  Pt will participate in further balance assessments with LTG to be reset: BERG and five time sit to stand    Time  4    Period  Weeks    Status  New    Target Date  04/19/19      PT SHORT TERM GOAL #3   Title  Patient will participate in full vestibular evaluation with LTG set if needed and will initiate vestibular HEP if needed    Time  4    Period  Weeks    Status  New    Target Date  04/19/19      PT SHORT TERM GOAL #4   Title  Pt will report being able to stand for >10-15 minutes at home to perform housework such as washing dishes or do laundry with pain no >5/10    Baseline  10 minutes, 9/10    Time   4    Period  Weeks    Status  New    Target Date  04/19/19      PT SHORT TERM GOAL #5   Title  Pt will report walking around neighborhood for up to 8-10 minutes with wife's supervision and LRAD with pain no >5/10    Time  4    Period  Weeks    Status  New    Target Date  04/19/19        PT Long Term Goals - 04/01/19 1529      PT LONG TERM GOAL #1   Title  Pt reports ability to ambulate >/= 30 minutes with LRAD without increase in pain. (all LTG target date: 05/19/19)    Baseline  03/19/19: no more than 10 minutes, uses walking stick    Time  8    Period  Weeks    Status  On-going      PT LONG TERM GOAL #2   Title  Pt demonstrates TUG </= 13 seconds to indicate lower fall risk.    Baseline  03/19/19: 17.33s    Time  8    Period  Weeks    Status  On-going      PT LONG TERM GOAL #3   Title  Pt will report being able to stand >20 minutes at home to perform household chores with pain no >3/10    Time  8    Period  Weeks    Status  On-going      PT LONG TERM GOAL #4   Title  Will improve 5xSTS to 13 seconds or less    Baseline  17    Time  8    Period  Weeks    Status  Revised      PT LONG TERM GOAL #5   Title  Will improve BERG to at least 46 to show improved balance.    Baseline  37    Time  8    Period  Weeks    Status  Revised      Additional Long Term Goals   Additional Long Term Goals  Yes      PT LONG TERM GOAL #6   Title  Pt will demonstrate 25% greater lumbar ROM into extension and side flexion    Time  8    Period  Weeks    Status  On-going      PT LONG TERM GOAL #7   Title  Vestibular goal if needed    Time  8    Period  Weeks    Status  New      PT LONG TERM GOAL #8   Title  He will improve [place] scale of kinesiophobia from 33 by 5 points to less than 28 to show less fear about pain and disability.    Status  New            Plan - 04/08/19 2130    Clinical Impression Statement  Standing exercises held today at his request due to knee  pain. Instead focused on supine leg/core strengthening and lumbar stretching.  He did however want to ambulate again with upwalker and he feels he needs one to improve his overall gait tolerance and endurance better than using his current walking stick. He does however appear to be improving with lumbar ROM and decreased overall lumbar pain and is becoming consistent and compliant with HEP. Had some symptoms of hypotension today with supine to sit. He may benefit from further vestibular exam.    Personal Factors and Comorbidities  Age;Comorbidity 3+;Behavior  Pattern;Fitness;Past/Current Experience;Time since onset of injury/illness/exacerbation    Comorbidities  monocular vertical diplopia, HTN, CAD s/p MI, CABG, neurogenic orthostatic hypotension, neuropathy, cervicogenic HA, microdiscectomy (2017, 2018)    Examination-Activity Limitations  Locomotion Level;Stairs;Stand    Examination-Participation Restrictions  Cleaning;Community Activity;Laundry;Yard Work    [employer]  Evolving/Moderate complexity    Rehab Potential  Fair   pt stated not wanting to do exercises at home   PT Frequency  2x / week    PT Duration  8 weeks    PT Treatment/Interventions  ADLs/Self Care Home Management;Canalith Repostioning;Gait training;Stair training;Therapeutic activities;Therapeutic exercise;Balance training;Patient/family education;Neuromuscular re-education;Manual techniques;Vestibular;Electrical Stimulation;Moist Heat;DME Instruction;Functional mobility training;Passive range of motion;Dry needling;Spinal Manipulations;Cryotherapy    PT Next Visit Plan  Audra to perform vestibular evaluation. Continue with lumbar stretching, general strength and endurace, and dynamic balance    PT Home Exercise Plan  Access Code: [email or web address]    Consulted and Agree with Plan of Care  Patient;Family member/caregiver       Patient will benefit from skilled therapeutic intervention in order to improve the  following deficits and impairments:  Abnormal gait, Decreased range of motion, Difficulty walking, Dizziness, Decreased activity tolerance, Decreased endurance, Pain, Decreased balance, Decreased strength, Decreased mobility, Impaired flexibility, Impaired sensation  Visit Diagnosis: Chronic midline low back pain without sciatica  Other abnormalities of gait and mobility  Muscle weakness (generalized)  Unsteadiness on feet  Dizziness and giddiness  Repeated falls     Problem List Patient Active Problem List   Diagnosis Date Noted  . Personal history of malignant melanoma of skin   . Lumbar radiculopathy   . Heart murmur   . GERD (gastroesophageal reflux disease)   . Diminished hearing, bilateral   . Cataracts, bilateral   . Bladder neck obstruction   . Arthritis   . Constipation 12/12/2016  . Nausea 12/12/2016  . Coronary artery disease involving native coronary artery of native heart without angina pectoris 11/22/2016  . Dyslipidemia 11/22/2016  . History of coronary artery bypass surgery 11/22/2016  . Depression with anxiety   . Orthostasis 09/20/2016  . HNP (herniated nucleus pulposus), lumbar 02/02/2016  . Spinal stenosis of lumbar region 01/24/2016  . Sciatica, right side 01/24/2016  . Prediabetes 01/24/2016  . Otitis, externa, infective 01/24/2016  . Left lumbar radiculopathy 01/15/2016  . PVC's (premature ventricular contractions) 05/24/2015  . Peripheral polyneuropathy 03/06/2015  . BPPV (benign paroxysmal positional vertigo) 03/06/2015  . Headache 01/18/2015  . Vertigo 01/18/2015  . Allergic rhinitis 09/20/2014  . Dyspnea on exertion 08/10/2014  . Severe dizziness 08/10/2014  . Acute diverticulitis 05/18/2014  . Diarrhea 05/18/2014  . LLQ pain 05/13/2014  . Chronic meniscal tear of knee 09/22/2013  . Primary localized osteoarthrosis, lower leg 09/22/2013  . Neck pain, bilateral 12/18/2012  . Headache(784.0) 12/18/2012  . Binocular visual disturbance  12/18/2012  . Epiretinal membrane 12/17/2012  . Status post intraocular lens implant 12/17/2012  . Preventative health care 12/02/2012  . Low back pain 12/02/2012  . Impaired glucose tolerance 12/02/2012  . Palpitations 10/09/2010  . B12 deficiency 08/30/2010  . Hearing loss 08/30/2010  . Tinnitus 08/30/2010  . Neurogenic orthostatic hypotension ([place]) 06/28/2009  . HYPERLIPIDEMIA TYPE IIB / III 05/15/2009  . CHEST PAIN UNSPECIFIED 11/23/2008  . Hypotension, unspecified 10/12/2008  . DIZZINESS 10/12/2008  . Shortness of breath 09/14/2008  . UNSPECIFIED ANEMIA 09/13/2008  . ANXIETY DEPRESSION 09/13/2008  . Old myocardial infarction 09/13/2008  . GASTROESOPHAGEAL REFLUX DISEASE, HX OF 09/13/2008  . LUMBAR RADICULOPATHY, LEFT 08/23/2008  .  MELANOMA, SHOULDER 11/27/2006  . Essential hypertension 11/27/2006  . Coronary atherosclerosis 11/27/2006  . BPH associated with nocturia 11/27/2006  . ERECTILE DYSFUNCTION, ORGANIC 11/27/2006    Silvestre Mesi 04/08/2019, 9:37 PM  [facility] [address] [place], [place], [address] Phone: [phone]   Fax:  [phone]  Name: Troy Combs MRN: [id number] Date of Birth: 28-Jun-1931

## 2019-04-19 ENCOUNTER — Encounter: Payer: Self-pay | Admitting: Rehabilitation

## 2019-04-19 ENCOUNTER — Other Ambulatory Visit: Payer: Self-pay

## 2019-04-19 ENCOUNTER — Ambulatory Visit: Payer: Medicare Other | Admitting: Rehabilitation

## 2019-04-19 DIAGNOSIS — M545 Low back pain: Secondary | ICD-10-CM | POA: Diagnosis not present

## 2019-04-19 DIAGNOSIS — R2689 Other abnormalities of gait and mobility: Secondary | ICD-10-CM

## 2019-04-19 DIAGNOSIS — R2681 Unsteadiness on feet: Secondary | ICD-10-CM

## 2019-04-19 DIAGNOSIS — M6281 Muscle weakness (generalized): Secondary | ICD-10-CM

## 2019-04-19 DIAGNOSIS — R42 Dizziness and giddiness: Secondary | ICD-10-CM

## 2019-04-19 NOTE — Therapy (Signed)
[facility] [address] [place], [place], [address] Phone: [phone]   Fax:  [phone]  Physical Therapy Treatment  Patient Details  Name: Troy Combs MRN: [id number] Date of Birth: [birth year] Referring Provider (PT): Antony Contras MD    Encounter Date: 04/19/2019  PT End of Session - 04/19/19 1555    Visit Number  6    Number of Visits  17    Date for PT Re-Evaluation  05/19/19    Authorization Type  [employer] Medicare - 10th visit PN    PT Start Time  1317    PT Stop Time  1400    PT Time Calculation (min)  43 min    Activity Tolerance  Patient tolerated treatment well    Behavior During Therapy  [facility] for tasks assessed/performed       Past Medical History:  Diagnosis Date  . Allergic rhinitis 09/20/2014  . Anemia, unspecified   . Arthritis   . Bladder neck obstruction   . Cataracts, bilateral   . Coronary atherosclerosis of unspecified type of vessel, native or graft   . Diminished hearing, bilateral   . Dysthymic disorder   . GERD (gastroesophageal reflux disease)   . Heart murmur    as a child  . Impaired glucose tolerance 12/02/2012  . Impotence of organic origin   . Lumbar radiculopathy   . Malignant melanoma of skin of upper limb, including shoulder ([place])   . Old myocardial infarction   . Personal history of malignant melanoma of skin   . Personal history of other diseases of digestive system   . Thoracic or lumbosacral neuritis or radiculitis, unspecified   . Unspecified essential hypertension     Past Surgical History:  Procedure Laterality Date  . ARTHRODESIS  07/06/2002   of left long finger distal interphalangeal joint with Kirschner wire fixation X 3  . ATRIAL ABLATION SURGERY  05/04/2001   Dr. [name]  . CARDIAC CATHETERIZATION  09/05/2008   Revealing 4 of 4 patent grafts with native multivessel coronary artery disease, EF  of 60% without regional wall motion abnormalities.  Marland Kitchen  CATARACT EXTRACTION, BILATERAL    . CHOLECYSTECTOMY    . COLONOSCOPY    . CORONARY ANGIOPLASTY  1993  . CORONARY ARTERY BYPASS GRAFT  10/17/2000   [name]. [name], [place]     AFTER LIVER TRAUMA AND HAND SURGERY  . EYE SURGERY    . HERNIA REPAIR     BILATERAL  . LEFT HEART CATH AND CORS/GRAFTS ANGIOGRAPHY N/A 04/03/2017   Procedure: LEFT HEART CATH AND CORS/GRAFTS ANGIOGRAPHY;  Surgeon: Leonie Man, MD;  Location: [facility];  Service: Cardiovascular;  Laterality: N/A;  . LUMBAR LAMINECTOMY/DECOMPRESSION MICRODISCECTOMY N/A 02/02/2016   Procedure: MICRODISCECTOMY LUMBAR FOUR- LUMBAR FIVE;  Surgeon: Ashok Pall, MD;  Location: [facility];  Service: Neurosurgery;  Laterality: N/A;  MICRODISCECTOMY L4-L5  . LUMBAR LAMINECTOMY/DECOMPRESSION MICRODISCECTOMY Right 08/02/2016   Procedure: MICRODISCECTOMY LUMBAR FOUR- LUMBAR FIVE RIGHT;  Surgeon: Ashok Pall, MD;  Location: [facility];  Service: Neurosurgery;  Laterality: Right;  . punctured eardrum      to relieve blood build up   for ear infection  . TONSILLECTOMY      There were no vitals filed for this visit.  Subjective Assessment - 04/19/19 1323    Subjective  Pt had bad day yesterday.  reports dizziness and being unbalanced.  Fell into wall a few times, no actual fall  to floor. Pt denies spinning or light headed sensation, just feeling of unsteadiness.    Pertinent History  monocular vertical diplopia, HTN, CAD s/p MI, CABG, neurogenic orthostatic hypotension, neuropathy, cervicogenic HA    Limitations  Standing;Walking    How long can you stand comfortably?  10 minutes    Patient Stated Goals  maintain high QOL, get back to doing household chores without pain, get out and walk, to feel safe walking/with balance in order for him and his wife to get a dog    Currently in Pain?  Yes    Pain Score  1     Pain Location  Back    Pain Orientation  Lower    Pain Descriptors / Indicators  Aching;Sharp    Pain Type   Chronic pain    Pain Onset  More than a month ago    Pain Frequency  Constant    Aggravating Factors   standing, walking    Pain Relieving Factors  sitting, rest, gabapentin             Vestibular Assessment - 04/19/19 0001      Symptom Behavior   Subjective history of current problem  Feeling of unsteadiness and dizziness     Type of Dizziness   Imbalance    Frequency of Dizziness  varies    Duration of Dizziness  seconds   was more all day on 12/20   Symptom Nature  Spontaneous    Aggravating Factors  No known aggravating factors    Relieving Factors  Rest    Progression of Symptoms  No change since onset      Oculomotor Exam   Oculomotor Alignment  Normal    Spontaneous  Absent    Saccades  Slow      Vestibulo-Ocular Reflex   VOR 1 Head Only (x 1 viewing)  delayed     VOR 2 Head and Object (x 2 viewing)  normal                [facility] Adult PT Treatment/Exercise - 04/19/19 1336      Standardized Balance Assessment   Standardized Balance Assessment  Berg Balance Test      Berg Balance Test   Sit to Stand  Able to stand  independently using hands    Standing Unsupported  Able to stand safely 2 minutes    Sitting with Back Unsupported but Feet Supported on Floor or Stool  Able to sit safely and securely 2 minutes    Stand to Sit  Sits safely with minimal use of hands    Transfers  Able to transfer safely, definite need of hands    Standing Unsupported with Eyes Closed  Able to stand 10 seconds safely    Standing Ubsupported with Feet Together  Able to place feet together independently and stand for 1 minute with supervision    From Standing, Reach Forward with Outstretched Arm  Can reach forward >12 cm safely (5")    From Standing Position, Pick up Object from Floor  Able to pick up shoe, needs supervision    From Standing Position, Turn to Look Behind Over each Shoulder  Needs supervision when turning    Turn 360 Degrees  Able to turn 360 degrees safely but  slowly    Standing Unsupported, Alternately Place Feet on Step/Stool  Able to complete >2 steps/needs minimal assist    Standing Unsupported, One Foot in Front  Able to take small step independently  and hold 30 seconds    Standing on One Leg  Tries to lift leg/unable to hold 3 seconds but remains standing independently    Total Score  38             PT Education - 04/19/19 1554    Education Details  STGs progress, vestibular hypofunction and exercises to treat.    Person(s) Educated  Patient    Methods  Explanation    Comprehension  Verbalized understanding       PT Short Term Goals - 04/19/19 1326      PT SHORT TERM GOAL #1   Title  Pt is independent with initial HEP  (ALL STG due by 04/19/2019)    Baseline  met per report    Time  4    Period  Weeks    Status  Achieved    Target Date  04/19/19      PT SHORT TERM GOAL #2   Title  Pt will participate in further balance assessments with LTG to be reset: BERG and five time sit to stand    Baseline  BERG balance went from 37/56 to 38/56, did improve 5TSS however needed BUE support to do so.    Time  4    Period  Weeks    Status  Achieved    Target Date  04/19/19      PT SHORT TERM GOAL #3   Title  Patient will participate in full vestibular evaluation with LTG set if needed and will initiate vestibular HEP if needed    Baseline  partially met 04/19/2019    Time  4    Period  Weeks    Status  Partially Met    Target Date  04/19/19      PT SHORT TERM GOAL #4   Title  Pt will report being able to stand for >10-15 minutes at home to perform housework such as washing dishes or do laundry with pain no >5/10    Baseline  met, made dinner the other night, 7/10 pain at most (but not until the very end of meal prep)    Time  4    Period  Weeks    Status  Achieved    Target Date  04/19/19      PT SHORT TERM GOAL #5   Title  Pt will report walking around neighborhood for up to 8-10 minutes with wife's supervision and LRAD  with pain no >5/10    Baseline  Has not been able to ambulate for exercise in 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    Time  4    Period  Weeks    Status  Not Met    Target Date  04/19/19        PT Long Term Goals - 04/01/19 1529      PT LONG TERM GOAL #1   Title  Pt reports ability to ambulate >/= 30 minutes with LRAD without increase in pain. (all LTG target date: 05/19/19)    Baseline  03/19/19: no more than 10 minutes, uses walking stick    Time  8    Period  Weeks    Status  On-going      PT LONG TERM GOAL #2   Title  Pt demonstrates TUG </= 13 seconds to indicate lower fall risk.    Baseline  03/19/19: 17.33s    Time  8  Period  Weeks    Status  On-going      PT LONG TERM GOAL #3   Title  Pt will report being able to stand >20 minutes at home to perform household chores with pain no >3/10    Time  8    Period  Weeks    Status  On-going      PT LONG TERM GOAL #4   Title  Will improve 5xSTS to 13 seconds or less    Baseline  17    Time  8    Period  Weeks    Status  Revised      PT LONG TERM GOAL #5   Title  Will improve BERG to at least 46 to show improved balance.    Baseline  37    Time  8    Period  Weeks    Status  Revised      Additional Long Term Goals   Additional Long Term Goals  Yes      PT LONG TERM GOAL #6   Title  Pt will demonstrate 25% greater lumbar ROM into extension and side flexion    Time  8    Period  Weeks    Status  On-going      PT LONG TERM GOAL #7   Title  Vestibular goal if needed    Time  8    Period  Weeks    Status  New      PT LONG TERM GOAL #8   Title  He will improve [place] scale of kinesiophobia from 33 by 5 points to less than 28 to show less fear about pain and disability.    Status  New            Plan - 04/19/19 1555    Clinical Impression Statement  Skilled session focused on assessment of STGs.  Pt has met 3/5 STGS, not meeting goals for decreased  pain with 8-10 mins of walking and partially meeting goal for vestibular assessment during session in which pt seems to have more of a hypofunction.  Will have vestibular PT assess at next visit.    Personal Factors and Comorbidities  Age;Comorbidity 3+;Behavior Pattern;Fitness;Past/Current Experience;Time since onset of injury/illness/exacerbation    Comorbidities  monocular vertical diplopia, HTN, CAD s/p MI, CABG, neurogenic orthostatic hypotension, neuropathy, cervicogenic HA, microdiscectomy (2017, 2018)    Examination-Activity Limitations  Locomotion Level;Stairs;Stand    Examination-Participation Restrictions  Cleaning;Community Activity;Laundry;Yard Work    [employer]  Evolving/Moderate complexity    Rehab Potential  Fair   pt stated not wanting to do exercises at home   PT Frequency  2x / week    PT Duration  8 weeks    PT Treatment/Interventions  ADLs/Self Care Home Management;Canalith Repostioning;Gait training;Stair training;Therapeutic activities;Therapeutic exercise;Balance training;Patient/family education;Neuromuscular re-education;Manual techniques;Vestibular;Electrical Stimulation;Moist Heat;DME Instruction;Functional mobility training;Passive range of motion;Dry needling;Spinal Manipulations;Cryotherapy    PT Next Visit Plan  Audra to perform vestibular evaluation-Emily did a super brief assessment, but please re-check, he def seems to have a hypofunction. Continue with lumbar stretching, general strength and endurace, and dynamic balance    PT Home Exercise Plan  Access Code: [email or web address]    Consulted and Agree with Plan of Care  Patient;Family member/caregiver       Patient will benefit from skilled therapeutic intervention in order to improve the following deficits and impairments:  Abnormal gait, Decreased range of motion, Difficulty walking, Dizziness, Decreased activity tolerance, Decreased endurance, Pain, Decreased  balance, Decreased strength, Decreased  mobility, Impaired flexibility, Impaired sensation  Visit Diagnosis: Other abnormalities of gait and mobility  Muscle weakness (generalized)  Unsteadiness on feet  Dizziness and giddiness     Problem List Patient Active Problem List   Diagnosis Date Noted  . Personal history of malignant melanoma of skin   . Lumbar radiculopathy   . Heart murmur   . GERD (gastroesophageal reflux disease)   . Diminished hearing, bilateral   . Cataracts, bilateral   . Bladder neck obstruction   . Arthritis   . Constipation 12/12/2016  . Nausea 12/12/2016  . Coronary artery disease involving native coronary artery of native heart without angina pectoris 11/22/2016  . Dyslipidemia 11/22/2016  . History of coronary artery bypass surgery 11/22/2016  . Depression with anxiety   . Orthostasis 09/20/2016  . HNP (herniated nucleus pulposus), lumbar 02/02/2016  . Spinal stenosis of lumbar region 01/24/2016  . Sciatica, right side 01/24/2016  . Prediabetes 01/24/2016  . Otitis, externa, infective 01/24/2016  . Left lumbar radiculopathy 01/15/2016  . PVC's (premature ventricular contractions) 05/24/2015  . Peripheral polyneuropathy 03/06/2015  . BPPV (benign paroxysmal positional vertigo) 03/06/2015  . Headache 01/18/2015  . Vertigo 01/18/2015  . Allergic rhinitis 09/20/2014  . Dyspnea on exertion 08/10/2014  . Severe dizziness 08/10/2014  . Acute diverticulitis 05/18/2014  . Diarrhea 05/18/2014  . LLQ pain 05/13/2014  . Chronic meniscal tear of knee 09/22/2013  . Primary localized osteoarthrosis, lower leg 09/22/2013  . Neck pain, bilateral 12/18/2012  . Headache(784.0) 12/18/2012  . Binocular visual disturbance 12/18/2012  . Epiretinal membrane 12/17/2012  . Status post intraocular lens implant 12/17/2012  . Preventative health care 12/02/2012  . Low back pain 12/02/2012  . Impaired glucose tolerance 12/02/2012  . Palpitations 10/09/2010  . B12 deficiency 08/30/2010  . Hearing loss  08/30/2010  . Tinnitus 08/30/2010  . Neurogenic orthostatic hypotension ([place]) 06/28/2009  . HYPERLIPIDEMIA TYPE IIB / III 05/15/2009  . CHEST PAIN UNSPECIFIED 11/23/2008  . Hypotension, unspecified 10/12/2008  . DIZZINESS 10/12/2008  . Shortness of breath 09/14/2008  . UNSPECIFIED ANEMIA 09/13/2008  . ANXIETY DEPRESSION 09/13/2008  . Old myocardial infarction 09/13/2008  . GASTROESOPHAGEAL REFLUX DISEASE, HX OF 09/13/2008  . LUMBAR RADICULOPATHY, LEFT 08/23/2008  . MELANOMA, SHOULDER 11/27/2006  . Essential hypertension 11/27/2006  . Coronary atherosclerosis 11/27/2006  . BPH associated with nocturia 11/27/2006  . ERECTILE DYSFUNCTION, ORGANIC 11/27/2006    Cameron Sprang, PT, MPT [facility] [address] [place], [place], [address] Phone: [phone]   Fax:  [phone] 04/19/19, 4:15 PM  Name: Troy Combs MRN: [id number] Date of Birth: Mar 19, 1932

## 2019-04-22 ENCOUNTER — Other Ambulatory Visit: Payer: Self-pay

## 2019-04-22 ENCOUNTER — Ambulatory Visit: Payer: Medicare Other | Admitting: Physical Therapy

## 2019-04-22 ENCOUNTER — Encounter: Payer: Self-pay | Admitting: Physical Therapy

## 2019-04-22 DIAGNOSIS — M6281 Muscle weakness (generalized): Secondary | ICD-10-CM

## 2019-04-22 DIAGNOSIS — G8929 Other chronic pain: Secondary | ICD-10-CM

## 2019-04-22 DIAGNOSIS — R2689 Other abnormalities of gait and mobility: Secondary | ICD-10-CM

## 2019-04-22 DIAGNOSIS — R296 Repeated falls: Secondary | ICD-10-CM

## 2019-04-22 DIAGNOSIS — R42 Dizziness and giddiness: Secondary | ICD-10-CM

## 2019-04-22 DIAGNOSIS — M545 Low back pain, unspecified: Secondary | ICD-10-CM

## 2019-04-22 DIAGNOSIS — R2681 Unsteadiness on feet: Secondary | ICD-10-CM

## 2019-04-22 NOTE — Therapy (Signed)
[facility] [address] [place], [place], [address] Phone: [phone]   Fax:  [phone]  Physical Therapy Treatment  Patient Details  Name: Troy Combs MRN: [id number] Date of Birth: Jun 29, 1931 Referring Provider (PT): Antony Contras MD    Encounter Date: 04/22/2019  PT End of Session - 04/22/19 1552    Visit Number  7    Number of Visits  17    Date for PT Re-Evaluation  05/19/19    Authorization Type  [employer] Medicare - 10th visit PN    PT Start Time  3329    PT Stop Time  1530    PT Time Calculation (min)  45 min    Activity Tolerance  Patient tolerated treatment well    Behavior During Therapy  [facility] for tasks assessed/performed       Past Medical History:  Diagnosis Date  . Allergic rhinitis 09/20/2014  . Anemia, unspecified   . Arthritis   . Bladder neck obstruction   . Cataracts, bilateral   . Coronary atherosclerosis of unspecified type of vessel, native or graft   . Diminished hearing, bilateral   . Dysthymic disorder   . GERD (gastroesophageal reflux disease)   . Heart murmur    as a child  . Impaired glucose tolerance 12/02/2012  . Impotence of organic origin   . Lumbar radiculopathy   . Malignant melanoma of skin of upper limb, including shoulder ([place])   . Old myocardial infarction   . Personal history of malignant melanoma of skin   . Personal history of other diseases of digestive system   . Thoracic or lumbosacral neuritis or radiculitis, unspecified   . Unspecified essential hypertension     Past Surgical History:  Procedure Laterality Date  . ARTHRODESIS  07/06/2002   of left long finger distal interphalangeal joint with Kirschner wire fixation X 3  . ATRIAL ABLATION SURGERY  05/04/2001   Dr. [name]  . CARDIAC CATHETERIZATION  09/05/2008   Revealing 4 of 4 patent grafts with native multivessel coronary artery disease, EF  of 60% without regional wall motion abnormalities.  Marland Kitchen  CATARACT EXTRACTION, BILATERAL    . CHOLECYSTECTOMY    . COLONOSCOPY    . CORONARY ANGIOPLASTY  1993  . CORONARY ARTERY BYPASS GRAFT  10/17/2000   [name]. [name], [place]     AFTER LIVER TRAUMA AND HAND SURGERY  . EYE SURGERY    . HERNIA REPAIR     BILATERAL  . LEFT HEART CATH AND CORS/GRAFTS ANGIOGRAPHY N/A 04/03/2017   Procedure: LEFT HEART CATH AND CORS/GRAFTS ANGIOGRAPHY;  Surgeon: Leonie Man, MD;  Location: [facility];  Service: Cardiovascular;  Laterality: N/A;  . LUMBAR LAMINECTOMY/DECOMPRESSION MICRODISCECTOMY N/A 02/02/2016   Procedure: MICRODISCECTOMY LUMBAR FOUR- LUMBAR FIVE;  Surgeon: Ashok Pall, MD;  Location: [facility];  Service: Neurosurgery;  Laterality: N/A;  MICRODISCECTOMY L4-L5  . LUMBAR LAMINECTOMY/DECOMPRESSION MICRODISCECTOMY Right 08/02/2016   Procedure: MICRODISCECTOMY LUMBAR FOUR- LUMBAR FIVE RIGHT;  Surgeon: Ashok Pall, MD;  Location: [facility];  Service: Neurosurgery;  Laterality: Right;  . punctured eardrum      to relieve blood build up   for ear infection  . TONSILLECTOMY      There were no vitals filed for this visit.  Subjective Assessment - 04/22/19 1446    Subjective  Still feeling significantly dizzy and off balance.  Doing well with exercises and walking.    Pertinent History  monocular  vertical diplopia, HTN, CAD s/p MI, CABG, neurogenic orthostatic hypotension, neuropathy, cervicogenic HA    Limitations  Standing;Walking    How long can you stand comfortably?  10 minutes    Patient Stated Goals  maintain high QOL, get back to doing household chores without pain, get out and walk, to feel safe walking/with balance in order for him and his wife to get a dog    Currently in Pain?  No/denies    Pain Onset  More than a month ago             Vestibular Assessment - 04/22/19 1446      Vestibular Assessment   General Observation  Walking with a walking stick      Symptom Behavior   Subjective history of  current problem  LBP was severe yesterday causing increased flexion; was bumping into objects more yesterday.  Had done a lot of work in the kitchen, stooping, bending, reaching.  Does not have exercises to perform when back pain flares up.    Type of Dizziness   Imbalance    Frequency of Dizziness  intermittent    Duration of Dizziness  seconds    Symptom Nature  Motion provoked    Aggravating Factors  No known aggravating factors    Relieving Factors  Slow movements    Progression of Symptoms  No change since onset      Oculomotor Exam   Oculomotor Alignment  Normal    Spontaneous  Absent    Gaze-induced   Absent    Smooth Pursuits  Intact    Saccades  Slow      Oculomotor Exam-Fixation Suppressed    Left Head Impulse  positive    Right Head Impulse  positive      Vestibulo-Ocular Reflex   VOR to Slow Head Movement  Normal    VOR Cancellation  Normal      Visual Acuity   Static  Unable to perform due to pt having one lens for distance, the other eye a lens for reading      Positional Testing   Dix-Hallpike  Dix-Hallpike Right;Dix-Hallpike Left    Horizontal Canal Testing  Horizontal Canal Right;Horizontal Canal Left      Dix-Hallpike Right   Dix-Hallpike Right Duration  0    Dix-Hallpike Right Symptoms  No nystagmus      Dix-Hallpike Left   Dix-Hallpike Left Duration  0    Dix-Hallpike Left Symptoms  No nystagmus      Horizontal Canal Right   Horizontal Canal Right Duration  0    Horizontal Canal Right Symptoms  Normal      Horizontal Canal Left   Horizontal Canal Left Duration  0    Horizontal Canal Left Symptoms  Normal      Positional Sensitivities   Sit to Supine  No dizziness    Supine to Sitting  Lightheadedness    Right Hallpike  No dizziness    Up from Right Hallpike  No dizziness    Up from Left Hallpike  No dizziness    Head Turning x 5  No dizziness    Head Nodding x 5  Lightheadedness    Pivot Right in Standing  No dizziness    Pivot Left in  Standing  No dizziness    Rolling Right  No dizziness    Rolling Left  No dizziness    Positional Sensitivities Comments  general sense of unsteadiness  Vestibular Treatment/Exercise - 04/22/19 1602      Vestibular Treatment/Exercise   Vestibular Treatment Provided  Gaze    Gaze Exercises  X1 Viewing Horizontal;X1 Viewing Vertical      X1 Viewing Horizontal   Foot Position  seated without back support    Reps  2    Comments  30 seconds, mild symptoms      X1 Viewing Vertical   Foot Position  seated without back support    Reps  2    Comments  30 seconds, mild symptoms         Balance Exercises - 04/22/19 1603      Balance Exercises: Standing   Standing Eyes Opened  Narrow base of support (BOS);Solid surface;10 secs;5 reps   tandem > staggered stance L and R   Standing Eyes Closed  Wide (BOA);Head turns;Solid surface;Other reps (comment)   10 reps head turns/nods   Tandem Stance  Eyes open;Intermittent upper extremity support;30 secs   increased difficulty/strain in low back       PT Education - 04/22/19 1544    Education Details  updated HEP and added VOR training in sitting    Person(s) Educated  Patient    Methods  Explanation;Demonstration;Handout    Comprehension  Verbalized understanding;Returned demonstration       PT Short Term Goals - 04/19/19 1326      PT SHORT TERM GOAL #1   Title  Pt is independent with initial HEP  (ALL STG due by 04/19/2019)    Baseline  met per report    Time  4    Period  Weeks    Status  Achieved    Target Date  04/19/19      PT SHORT TERM GOAL #2   Title  Pt will participate in further balance assessments with LTG to be reset: BERG and five time sit to stand    Baseline  BERG balance went from 37/56 to 38/56, did improve 5TSS however needed BUE support to do so.    Time  4    Period  Weeks    Status  Achieved    Target Date  04/19/19      PT SHORT TERM GOAL #3   Title  Patient will participate  in full vestibular evaluation with LTG set if needed and will initiate vestibular HEP if needed    Baseline  partially met 04/19/2019    Time  4    Period  Weeks    Status  Partially Met    Target Date  04/19/19      PT SHORT TERM GOAL #4   Title  Pt will report being able to stand for >10-15 minutes at home to perform housework such as washing dishes or do laundry with pain no >5/10    Baseline  met, made dinner the other night, 7/10 pain at most (but not until the very end of meal prep)    Time  4    Period  Weeks    Status  Achieved    Target Date  04/19/19      PT SHORT TERM GOAL #5   Title  Pt will report walking around neighborhood for up to 8-10 minutes with wife's supervision and LRAD with pain no >5/10    Baseline  Has not been able to ambulate for exercise in 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    Time  4    Period  Weeks    Status  Not Met    Target Date  04/19/19        PT Long Term Goals - 04/22/19 1600      PT LONG TERM GOAL #1   Title  Pt reports ability to ambulate >/= 30 minutes with LRAD without increase in pain. (all LTG target date: 05/19/19)    Baseline  03/19/19: no more than 10 minutes, uses walking stick    Time  8    Period  Weeks    Status  On-going      PT LONG TERM GOAL #2   Title  Pt demonstrates TUG </= 13 seconds to indicate lower fall risk.    Baseline  03/19/19: 17.33s    Time  8    Period  Weeks    Status  On-going      PT LONG TERM GOAL #3   Title  Pt will report being able to stand >20 minutes at home to perform household chores with pain no >3/10    Time  8    Period  Weeks    Status  On-going      PT LONG TERM GOAL #4   Title  Will improve 5xSTS to 13 seconds or less    Baseline  17    Time  8    Period  Weeks    Status  Revised      PT LONG TERM GOAL #5   Title  Will improve BERG to at least 46 to show improved balance.    Baseline  37    Time  8    Period  Weeks     Status  Revised      PT LONG TERM GOAL #6   Title  Pt will demonstrate 25% greater lumbar ROM into extension and side flexion    Time  8    Period  Weeks    Status  On-going      PT LONG TERM GOAL #7   Title  Pt will demonstrate improved use of VOR as indicated by his ability to perform X1 viewing in standing x 2 minutes with no UE support    Time  8    Period  Weeks    Status  Revised    Target Date  05/18/18      PT LONG TERM GOAL #8   Title  He will improve [place] scale of kinesiophobia from 33 by 5 points to less than 28 to show less fear about pain and disability.    Status  New            Plan - 04/22/19 1553    Clinical Impression Statement  Performed formal vestibular evaluation with pt demonstrating mild bilat vestibular hypofunction, disequilibrium and mild motion sensitivity.  Initiated x1 viewing in sitting and continued to review and progress multi sensory balance training.  Will continue to address and progress towards LTG.    Personal Factors and Comorbidities  Age;Comorbidity 3+;Behavior Pattern;Fitness;Past/Current Experience;Time since onset of injury/illness/exacerbation    Comorbidities  monocular vertical diplopia, HTN, CAD s/p MI, CABG, neurogenic orthostatic hypotension, neuropathy, cervicogenic HA, microdiscectomy (2017, 2018)    Examination-Activity Limitations  Locomotion Level;Stairs;Stand    Examination-Participation Restrictions  Cleaning;Community Activity;Laundry;Yard Work    Stability/Clinical Decision Making  Evolving/Moderate complexity    Rehab Potential  Fair   pt stated not wanting to do exercises at home   PT Frequency  2x /  week    PT Duration  8 weeks    PT Treatment/Interventions  ADLs/Self Care Home Management;Canalith Repostioning;Gait training;Stair training;Therapeutic activities;Therapeutic exercise;Balance training;Patient/family education;Neuromuscular re-education;Manual techniques;Vestibular;Electrical Stimulation;Moist Heat;DME  Instruction;Functional mobility training;Passive range of motion;Dry needling;Spinal Manipulations;Cryotherapy    PT Next Visit Plan  Multi-sensory balance training, balance reactions, balance with turning/pivoting, hip extension strengthening.  Progress vestibular exercises    PT Home Exercise Plan  Access Code: [email or web address]    Consulted and Agree with Plan of Care  Patient       Patient will benefit from skilled therapeutic intervention in order to improve the following deficits and impairments:  Abnormal gait, Decreased range of motion, Difficulty walking, Dizziness, Decreased activity tolerance, Decreased endurance, Pain, Decreased balance, Decreased strength, Decreased mobility, Impaired flexibility, Impaired sensation  Visit Diagnosis: Other abnormalities of gait and mobility  Muscle weakness (generalized)  Unsteadiness on feet  Dizziness and giddiness  Chronic midline low back pain without sciatica  Repeated falls     Problem List Patient Active Problem List   Diagnosis Date Noted  . Personal history of malignant melanoma of skin   . Lumbar radiculopathy   . Heart murmur   . GERD (gastroesophageal reflux disease)   . Diminished hearing, bilateral   . Cataracts, bilateral   . Bladder neck obstruction   . Arthritis   . Constipation 12/12/2016  . Nausea 12/12/2016  . Coronary artery disease involving native coronary artery of native heart without angina pectoris 11/22/2016  . Dyslipidemia 11/22/2016  . History of coronary artery bypass surgery 11/22/2016  . Depression with anxiety   . Orthostasis 09/20/2016  . HNP (herniated nucleus pulposus), lumbar 02/02/2016  . Spinal stenosis of lumbar region 01/24/2016  . Sciatica, right side 01/24/2016  . Prediabetes 01/24/2016  . Otitis, externa, infective 01/24/2016  . Left lumbar radiculopathy 01/15/2016  . PVC's (premature ventricular contractions) 05/24/2015  . Peripheral polyneuropathy 03/06/2015  . BPPV (benign  paroxysmal positional vertigo) 03/06/2015  . Headache 01/18/2015  . Vertigo 01/18/2015  . Allergic rhinitis 09/20/2014  . Dyspnea on exertion 08/10/2014  . Severe dizziness 08/10/2014  . Acute diverticulitis 05/18/2014  . Diarrhea 05/18/2014  . LLQ pain 05/13/2014  . Chronic meniscal tear of knee 09/22/2013  . Primary localized osteoarthrosis, lower leg 09/22/2013  . Neck pain, bilateral 12/18/2012  . Headache(784.0) 12/18/2012  . Binocular visual disturbance 12/18/2012  . Epiretinal membrane 12/17/2012  . Status post intraocular lens implant 12/17/2012  . Preventative health care 12/02/2012  . Low back pain 12/02/2012  . Impaired glucose tolerance 12/02/2012  . Palpitations 10/09/2010  . B12 deficiency 08/30/2010  . Hearing loss 08/30/2010  . Tinnitus 08/30/2010  . Neurogenic orthostatic hypotension ([place]) 06/28/2009  . HYPERLIPIDEMIA TYPE IIB / III 05/15/2009  . CHEST PAIN UNSPECIFIED 11/23/2008  . Hypotension, unspecified 10/12/2008  . DIZZINESS 10/12/2008  . Shortness of breath 09/14/2008  . UNSPECIFIED ANEMIA 09/13/2008  . ANXIETY DEPRESSION 09/13/2008  . Old myocardial infarction 09/13/2008  . GASTROESOPHAGEAL REFLUX DISEASE, HX OF 09/13/2008  . LUMBAR RADICULOPATHY, LEFT 08/23/2008  . MELANOMA, SHOULDER 11/27/2006  . Essential hypertension 11/27/2006  . Coronary atherosclerosis 11/27/2006  . BPH associated with nocturia 11/27/2006  . ERECTILE DYSFUNCTION, ORGANIC 11/27/2006    Rico Junker, PT, DPT 04/22/19    4:04 PM    [facility] [address] [place], [place], [address] Phone: [phone]   Fax:  [phone]  Name: SAHAN PEN MRN: [id number] Date of Birth: June 05, 1931

## 2019-04-22 NOTE — Patient Instructions (Addendum)
Gaze Stabilization: Sitting    Keeping eyes on target on wall 3 feet away, and move head side to side for _30___ seconds. Rest and then repeat while moving head up and down for __30__ seconds. Do __2__ sessions per day.  Copyright  VHI. All rights reserved.   Gaze Stabilization: Tip Card  1.Target must remain in focus, not blurry, and appear stationary while head is in motion. 2.Perform exercises with small head movements (45 to either side of midline). 3.Increase speed of head motion so long as target is in focus. 4.If you wear eyeglasses, be sure you can see target through lens (therapist will give specific instructions for bifocal / progressive lenses). 5.These exercises may provoke dizziness or nausea. Work through these symptoms. If too dizzy, slow head movement slightly. Rest between each exercise. 6.Exercises demand concentration; avoid distractions.     Access Code: [email or web address]  URL: [email or web address]  Date: 04/22/2019  Prepared by: Misty Stanley   Exercises Supine Lower Trunk Rotation - 5 reps - 1 sets - 10 sec hold - 2x daily - 6x weekly Hooklying Single Knee to Chest Stretch - 2 reps - 30 hold - 2x daily - 6x weekly Staggered stance with Eyes Open - 2 sets - 10 hold - 2x daily - 6x weekly Standing Balance in Corner with Eyes Closed, Head turns and nods - 10 reps - 2 sets - 2x daily - 6x weekly Sit to Stand - 5 reps - 2-3 sets - 2x daily - 6x weekly

## 2019-04-27 ENCOUNTER — Other Ambulatory Visit: Payer: Self-pay

## 2019-04-27 ENCOUNTER — Encounter: Payer: Self-pay | Admitting: Rehabilitation

## 2019-04-27 ENCOUNTER — Ambulatory Visit: Payer: Medicare Other | Admitting: Rehabilitation

## 2019-04-27 DIAGNOSIS — R42 Dizziness and giddiness: Secondary | ICD-10-CM

## 2019-04-27 DIAGNOSIS — M545 Low back pain: Secondary | ICD-10-CM | POA: Diagnosis not present

## 2019-04-27 DIAGNOSIS — R2681 Unsteadiness on feet: Secondary | ICD-10-CM

## 2019-04-27 DIAGNOSIS — R2689 Other abnormalities of gait and mobility: Secondary | ICD-10-CM

## 2019-04-27 NOTE — Patient Instructions (Signed)
Gaze Stabilization: Standing Feet Apart    Feet shoulder width apart, keeping eyes on target on wall _3-4___ feet away, tilt head down 15-30 and move head side to side for _15-30___ seconds or 5-10 reps. Repeat while moving head up and down for _15-30___ seconds or 5-10 reps .  Just keep in mind how dizzy you are getting.  For example, if you start at 0/10 and you start doing exercises and get to 3-4/10 on 5 reps, then stop and let it settle before continuing.  Do __2__ sessions per day.   Copyright  VHI. All rights reserved.

## 2019-04-27 NOTE — Therapy (Signed)
[facility] [address] [place], [place], [address] Phone: [phone]   Fax:  [phone]  Physical Therapy Treatment  Patient Details  Name: Troy Combs MRN: [id number] Date of Birth: 1932-03-28 Referring Provider (PT): Antony Contras MD    Encounter Date: 04/27/2019  PT End of Session - 04/27/19 1508    Visit Number  8    Number of Visits  17    Date for PT Re-Evaluation  05/19/19    Authorization Type  [employer] Medicare - 10th visit PN    PT Start Time  1405    PT Stop Time  1447    PT Time Calculation (min)  42 min    Activity Tolerance  Patient tolerated treatment well    Behavior During Therapy  [facility] for tasks assessed/performed       Past Medical History:  Diagnosis Date  . Allergic rhinitis 09/20/2014  . Anemia, unspecified   . Arthritis   . Bladder neck obstruction   . Cataracts, bilateral   . Coronary atherosclerosis of unspecified type of vessel, native or graft   . Diminished hearing, bilateral   . Dysthymic disorder   . GERD (gastroesophageal reflux disease)   . Heart murmur    as a child  . Impaired glucose tolerance 12/02/2012  . Impotence of organic origin   . Lumbar radiculopathy   . Malignant melanoma of skin of upper limb, including shoulder ([place])   . Old myocardial infarction   . Personal history of malignant melanoma of skin   . Personal history of other diseases of digestive system   . Thoracic or lumbosacral neuritis or radiculitis, unspecified   . Unspecified essential hypertension     Past Surgical History:  Procedure Laterality Date  . ARTHRODESIS  07/06/2002   of left long finger distal interphalangeal joint with Kirschner wire fixation X 3  . ATRIAL ABLATION SURGERY  05/04/2001   Dr. [name]  . CARDIAC CATHETERIZATION  09/05/2008   Revealing 4 of 4 patent grafts with native multivessel coronary artery disease, EF  of 60% without regional wall motion abnormalities.  Marland Kitchen  CATARACT EXTRACTION, BILATERAL    . CHOLECYSTECTOMY    . COLONOSCOPY    . CORONARY ANGIOPLASTY  1993  . CORONARY ARTERY BYPASS GRAFT  10/17/2000   [name]. [name], [place]     AFTER LIVER TRAUMA AND HAND SURGERY  . EYE SURGERY    . HERNIA REPAIR     BILATERAL  . LEFT HEART CATH AND CORS/GRAFTS ANGIOGRAPHY N/A 04/03/2017   Procedure: LEFT HEART CATH AND CORS/GRAFTS ANGIOGRAPHY;  Surgeon: Leonie Man, MD;  Location: [facility];  Service: Cardiovascular;  Laterality: N/A;  . LUMBAR LAMINECTOMY/DECOMPRESSION MICRODISCECTOMY N/A 02/02/2016   Procedure: MICRODISCECTOMY LUMBAR FOUR- LUMBAR FIVE;  Surgeon: Ashok Pall, MD;  Location: [facility];  Service: Neurosurgery;  Laterality: N/A;  MICRODISCECTOMY L4-L5  . LUMBAR LAMINECTOMY/DECOMPRESSION MICRODISCECTOMY Right 08/02/2016   Procedure: MICRODISCECTOMY LUMBAR FOUR- LUMBAR FIVE RIGHT;  Surgeon: Ashok Pall, MD;  Location: [facility];  Service: Neurosurgery;  Laterality: Right;  . punctured eardrum      to relieve blood build up   for ear infection  . TONSILLECTOMY      There were no vitals filed for this visit.  Subjective Assessment - 04/27/19 1409    Subjective  Still feeling off balance and dizzy.  Having bad back pain this morning.    Pertinent History  monocular vertical  diplopia, HTN, CAD s/p MI, CABG, neurogenic orthostatic hypotension, neuropathy, cervicogenic HA    Limitations  Standing;Walking    How long can you stand comfortably?  10 minutes    Patient Stated Goals  maintain high QOL, get back to doing household chores without pain, get out and walk, to feel safe walking/with balance in order for him and his wife to get a dog    Currently in Pain?  Yes    Pain Score  6     Pain Location  Back    Pain Orientation  Lower    Pain Descriptors / Indicators  Dull;Aching    Pain Type  Chronic pain    Pain Onset  More than a month ago    Pain Frequency  Constant    Aggravating Factors   standing, walking     Pain Relieving Factors  sitting, rest, gabapentin                       [facility] Adult PT Treatment/Exercise - 04/27/19 1459      Ambulation/Gait   Ambulation/Gait  Yes    Ambulation/Gait Assistance  4: Min assist    Ambulation/Gait Assistance Details  Worked on gait with performing turns as this has been difficult for pt and causes unsteadiness and dizziness.  Performed 230' (115' in one direction and then another 64' in the other direction) with cues for performing head turn first then turning body to reduce unsteadiness/dizziness.   Pt did best when he came to a stop then turned head then body, otherwise felt very unsteady and dizzy.  Provided min A for safety with use of walking stick.     Ambulation Distance (Feet)  230 Feet    Assistive device  --   walking stick   Gait Pattern  Step-through pattern;Decreased arm swing - right;Decreased arm swing - left;Decreased stride length;Decreased step length - left;Decreased step length - right;Decreased hip/knee flexion - right;Decreased hip/knee flexion - left;Decreased trunk rotation;Trunk flexed;Wide base of support    Ambulation Surface  Level;Indoor    Gait Comments  Discussed that due to marked unsteadiness, PT would recommend he use RW.  He reports he does have one at home but would prefer upwalker.  Discussed that PT would request order from MD that it would likely be that they cover some of the cost and he would be responsible for remaining costs.  Pt verbalized understanding.        Self-Care   Self-Care  Other Self-Care Comments    Other Self-Care Comments   Discussed walker as above.  PT checked BP during session following gait and was 157/93, following 4 mins of rest, was 158/99.  Pt concerned about BP being so high as it has been high in the past.  Recommended he make follow up with cardiologist to assess issue.   Pt verbalized understanding.        Neuro Re-ed    Neuro Re-ed Details   Reviewed VOR exercises from  previous session.  Pt reports that previous PT had him perform in standing, therefore assessed in standing.  Pt somewhat unsteady during first rep, but was able to stabilize using chair.  Updated exercise program with education on how to ensure appropriate amount of vestibular system provocation as he quickly got to 7/10 dizziness today with VOR following approx 5 reps.  Performed horizontally and vertically x 5 reps each.  Note pt performing increased range, causing target to become unfocused and  doubled.  Educated to perform less range in order to keep target focused.  Pt verbalized and return demonstration.   Progressed to performing quarter turns (body and head at same time) to target on wall with UE support on chair.  Note that this increased dizziness and unsteadiness significantly, therefore had him perform head then body motions to reduce dizziness and work on stepping strategy.  Pt tolerated this, therefore worked into gait, see above.                 PT Short Term Goals - 04/19/19 1326      PT SHORT TERM GOAL #1   Title  Pt is independent with initial HEP  (ALL STG due by 04/19/2019)    Baseline  met per report    Time  4    Period  Weeks    Status  Achieved    Target Date  04/19/19      PT SHORT TERM GOAL #2   Title  Pt will participate in further balance assessments with LTG to be reset: BERG and five time sit to stand    Baseline  BERG balance went from 37/56 to 38/56, did improve 5TSS however needed BUE support to do so.    Time  4    Period  Weeks    Status  Achieved    Target Date  04/19/19      PT SHORT TERM GOAL #3   Title  Patient will participate in full vestibular evaluation with LTG set if needed and will initiate vestibular HEP if needed    Baseline  partially met 04/19/2019    Time  4    Period  Weeks    Status  Partially Met    Target Date  04/19/19      PT SHORT TERM GOAL #4   Title  Pt will report being able to stand for >10-15 minutes at home to  perform housework such as washing dishes or do laundry with pain no >5/10    Baseline  met, made dinner the other night, 7/10 pain at most (but not until the very end of meal prep)    Time  4    Period  Weeks    Status  Achieved    Target Date  04/19/19      PT SHORT TERM GOAL #5   Title  Pt will report walking around neighborhood for up to 8-10 minutes with wife's supervision and LRAD with pain no >5/10    Baseline  Has not been able to ambulate for exercise in 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    Time  4    Period  Weeks    Status  Not Met    Target Date  04/19/19        PT Long Term Goals - 04/22/19 1600      PT LONG TERM GOAL #1   Title  Pt reports ability to ambulate >/= 30 minutes with LRAD without increase in pain. (all LTG target date: 05/19/19)    Baseline  03/19/19: no more than 10 minutes, uses walking stick    Time  8    Period  Weeks    Status  On-going      PT LONG TERM GOAL #2   Title  Pt demonstrates TUG </= 13 seconds to indicate lower fall risk.    Baseline  03/19/19: 17.33s  Time  8    Period  Weeks    Status  On-going      PT LONG TERM GOAL #3   Title  Pt will report being able to stand >20 minutes at home to perform household chores with pain no >3/10    Time  8    Period  Weeks    Status  On-going      PT LONG TERM GOAL #4   Title  Will improve 5xSTS to 13 seconds or less    Baseline  17    Time  8    Period  Weeks    Status  Revised      PT LONG TERM GOAL #5   Title  Will improve BERG to at least 46 to show improved balance.    Baseline  37    Time  8    Period  Weeks    Status  Revised      PT LONG TERM GOAL #6   Title  Pt will demonstrate 25% greater lumbar ROM into extension and side flexion    Time  8    Period  Weeks    Status  On-going      PT LONG TERM GOAL #7   Title  Pt will demonstrate improved use of VOR as indicated by his ability to perform X1 viewing in  standing x 2 minutes with no UE support    Time  8    Period  Weeks    Status  Revised    Target Date  05/18/18      PT LONG TERM GOAL #8   Title  He will improve [place] scale of kinesiophobia from 33 by 5 points to less than 28 to show less fear about pain and disability.    Status  New            Plan - 04/27/19 1509    Clinical Impression Statement  Skilled session focused on review of VOR exercises from last session (in standing).  Note that pt especially unsteady and not feeling well today.  He reports feeling more dizzy today as well.  Upon assessment of BP, it was noted to be higher than normal for him.  Recommended he follow up with MD.  PT to request walker order from MD so that pt may persue upwalker.    Personal Factors and Comorbidities  Age;Comorbidity 3+;Behavior Pattern;Fitness;Past/Current Experience;Time since onset of injury/illness/exacerbation    Comorbidities  monocular vertical diplopia, HTN, CAD s/p MI, CABG, neurogenic orthostatic hypotension, neuropathy, cervicogenic HA, microdiscectomy (2017, 2018)    Examination-Activity Limitations  Locomotion Level;Stairs;Stand    Examination-Participation Restrictions  Cleaning;Community Activity;Laundry;Yard Work    [employer]  Evolving/Moderate complexity    Rehab Potential  Fair   pt stated not wanting to do exercises at home   PT Frequency  2x / week    PT Duration  8 weeks    PT Treatment/Interventions  ADLs/Self Care Home Management;Canalith Repostioning;Gait training;Stair training;Therapeutic activities;Therapeutic exercise;Balance training;Patient/family education;Neuromuscular re-education;Manual techniques;Vestibular;Electrical Stimulation;Moist Heat;DME Instruction;Functional mobility training;Passive range of motion;Dry needling;Spinal Manipulations;Cryotherapy    PT Next Visit Plan  did he follow up with MD, order for walker?, Multi-sensory balance training, balance reactions, balance  with turning/pivoting, hip extension strengthening.  Progress vestibular exercises    PT Home Exercise Plan  Access Code: [email or web address]    Consulted and Agree with Plan of Care  Patient       Patient will benefit from skilled therapeutic  intervention in order to improve the following deficits and impairments:  Abnormal gait, Decreased range of motion, Difficulty walking, Dizziness, Decreased activity tolerance, Decreased endurance, Pain, Decreased balance, Decreased strength, Decreased mobility, Impaired flexibility, Impaired sensation  Visit Diagnosis: Other abnormalities of gait and mobility  Dizziness and giddiness  Unsteadiness on feet     Problem List Patient Active Problem List   Diagnosis Date Noted  . Personal history of malignant melanoma of skin   . Lumbar radiculopathy   . Heart murmur   . GERD (gastroesophageal reflux disease)   . Diminished hearing, bilateral   . Cataracts, bilateral   . Bladder neck obstruction   . Arthritis   . Constipation 12/12/2016  . Nausea 12/12/2016  . Coronary artery disease involving native coronary artery of native heart without angina pectoris 11/22/2016  . Dyslipidemia 11/22/2016  . History of coronary artery bypass surgery 11/22/2016  . Depression with anxiety   . Orthostasis 09/20/2016  . HNP (herniated nucleus pulposus), lumbar 02/02/2016  . Spinal stenosis of lumbar region 01/24/2016  . Sciatica, right side 01/24/2016  . Prediabetes 01/24/2016  . Otitis, externa, infective 01/24/2016  . Left lumbar radiculopathy 01/15/2016  . PVC's (premature ventricular contractions) 05/24/2015  . Peripheral polyneuropathy 03/06/2015  . BPPV (benign paroxysmal positional vertigo) 03/06/2015  . Headache 01/18/2015  . Vertigo 01/18/2015  . Allergic rhinitis 09/20/2014  . Dyspnea on exertion 08/10/2014  . Severe dizziness 08/10/2014  . Acute diverticulitis 05/18/2014  . Diarrhea 05/18/2014  . LLQ pain 05/13/2014  . Chronic meniscal tear of  knee 09/22/2013  . Primary localized osteoarthrosis, lower leg 09/22/2013  . Neck pain, bilateral 12/18/2012  . Headache(784.0) 12/18/2012  . Binocular visual disturbance 12/18/2012  . Epiretinal membrane 12/17/2012  . Status post intraocular lens implant 12/17/2012  . Preventative health care 12/02/2012  . Low back pain 12/02/2012  . Impaired glucose tolerance 12/02/2012  . Palpitations 10/09/2010  . B12 deficiency 08/30/2010  . Hearing loss 08/30/2010  . Tinnitus 08/30/2010  . Neurogenic orthostatic hypotension ([place]) 06/28/2009  . HYPERLIPIDEMIA TYPE IIB / III 05/15/2009  . CHEST PAIN UNSPECIFIED 11/23/2008  . Hypotension, unspecified 10/12/2008  . DIZZINESS 10/12/2008  . Shortness of breath 09/14/2008  . UNSPECIFIED ANEMIA 09/13/2008  . ANXIETY DEPRESSION 09/13/2008  . Old myocardial infarction 09/13/2008  . GASTROESOPHAGEAL REFLUX DISEASE, HX OF 09/13/2008  . LUMBAR RADICULOPATHY, LEFT 08/23/2008  . MELANOMA, SHOULDER 11/27/2006  . Essential hypertension 11/27/2006  . Coronary atherosclerosis 11/27/2006  . BPH associated with nocturia 11/27/2006  . ERECTILE DYSFUNCTION, ORGANIC 11/27/2006    Cameron Sprang, PT, MPT [facility] [address] [place], [place], [address] Phone: [phone]   Fax:  [phone] 04/27/19, 3:18 PM  Name: Troy Combs MRN: [id number] Date of Birth: 16-Dec-1931

## 2019-04-29 ENCOUNTER — Ambulatory Visit: Payer: Medicare Other | Admitting: Rehabilitation

## 2019-04-29 ENCOUNTER — Encounter: Payer: Self-pay | Admitting: Rehabilitation

## 2019-04-29 ENCOUNTER — Other Ambulatory Visit: Payer: Self-pay

## 2019-04-29 DIAGNOSIS — R2681 Unsteadiness on feet: Secondary | ICD-10-CM

## 2019-04-29 DIAGNOSIS — M6281 Muscle weakness (generalized): Secondary | ICD-10-CM

## 2019-04-29 DIAGNOSIS — R2689 Other abnormalities of gait and mobility: Secondary | ICD-10-CM

## 2019-04-29 DIAGNOSIS — M545 Low back pain: Secondary | ICD-10-CM | POA: Diagnosis not present

## 2019-04-29 DIAGNOSIS — R42 Dizziness and giddiness: Secondary | ICD-10-CM

## 2019-04-29 NOTE — Therapy (Signed)
[facility] [address] [place], [place], [address] Phone: [phone]   Fax:  [phone]  Physical Therapy Treatment  Patient Details  Name: Troy Combs MRN: [id number] Date of Birth: 1931-12-23 Referring Provider (PT): Antony Contras MD    Encounter Date: 04/29/2019  PT End of Session - 04/29/19 1332    Visit Number  9    Number of Visits  17    Date for PT Re-Evaluation  05/19/19    Authorization Type  [employer] - 10th visit PN    PT Start Time  1230    PT Stop Time  1315    PT Time Calculation (min)  45 min    Activity Tolerance  Patient tolerated treatment well    Behavior During Therapy  [facility] for tasks assessed/performed       Past Medical History:  Diagnosis Date  . Allergic rhinitis 09/20/2014  . Anemia, unspecified   . Arthritis   . Bladder neck obstruction   . Cataracts, bilateral   . Coronary atherosclerosis of unspecified type of vessel, native or graft   . Diminished hearing, bilateral   . Dysthymic disorder   . GERD (gastroesophageal reflux disease)   . Heart murmur    as a child  . Impaired glucose tolerance 12/02/2012  . Impotence of organic origin   . Lumbar radiculopathy   . Malignant melanoma of skin of upper limb, including shoulder ([place])   . Old myocardial infarction   . Personal history of malignant melanoma of skin   . Personal history of other diseases of digestive system   . Thoracic or lumbosacral neuritis or radiculitis, unspecified   . Unspecified essential hypertension     Past Surgical History:  Procedure Laterality Date  . ARTHRODESIS  07/06/2002   of left long finger distal interphalangeal joint with Kirschner wire fixation X 3  . ATRIAL ABLATION SURGERY  05/04/2001   Dr. [name]  . CARDIAC CATHETERIZATION  09/05/2008   Revealing 4 of 4 patent grafts with native multivessel coronary artery disease, EF  of 60% without regional wall motion abnormalities.  Marland Kitchen  CATARACT EXTRACTION, BILATERAL    . CHOLECYSTECTOMY    . COLONOSCOPY    . CORONARY ANGIOPLASTY  1993  . CORONARY ARTERY BYPASS GRAFT  10/17/2000   [name]. [name], [place]     AFTER LIVER TRAUMA AND HAND SURGERY  . EYE SURGERY    . HERNIA REPAIR     BILATERAL  . LEFT HEART CATH AND CORS/GRAFTS ANGIOGRAPHY N/A 04/03/2017   Procedure: LEFT HEART CATH AND CORS/GRAFTS ANGIOGRAPHY;  Surgeon: Leonie Man, MD;  Location: [facility];  Service: Cardiovascular;  Laterality: N/A;  . LUMBAR LAMINECTOMY/DECOMPRESSION MICRODISCECTOMY N/A 02/02/2016   Procedure: MICRODISCECTOMY LUMBAR FOUR- LUMBAR FIVE;  Surgeon: Ashok Pall, MD;  Location: [facility];  Service: Neurosurgery;  Laterality: N/A;  MICRODISCECTOMY L4-L5  . LUMBAR LAMINECTOMY/DECOMPRESSION MICRODISCECTOMY Right 08/02/2016   Procedure: MICRODISCECTOMY LUMBAR FOUR- LUMBAR FIVE RIGHT;  Surgeon: Ashok Pall, MD;  Location: [facility];  Service: Neurosurgery;  Laterality: Right;  . punctured eardrum      to relieve blood build up   for ear infection  . TONSILLECTOMY      There were no vitals filed for this visit.  Subjective Assessment - 04/29/19 1230    Subjective  Still feeling off balance and unsteady today.    Pertinent History  monocular vertical diplopia, HTN, CAD s/p MI, CABG,  neurogenic orthostatic hypotension, neuropathy, cervicogenic HA    Limitations  Standing;Walking    How long can you stand comfortably?  10 minutes    Patient Stated Goals  maintain high QOL, get back to doing household chores without pain, get out and walk, to feel safe walking/with balance in order for him and his wife to get a dog    Currently in Pain?  Yes    Pain Score  6     Pain Location  Back    Pain Orientation  Lower    Pain Descriptors / Indicators  Aching    Pain Type  Chronic pain    Pain Radiating Towards  none    Pain Onset  More than a month ago    Pain Frequency  Constant    Aggravating Factors   standing, walking     Pain Relieving Factors  sitting, rest, meds                       [facility] Adult PT Treatment/Exercise - 04/29/19 1237      Ambulation/Gait   Ambulation/Gait  Yes    Ambulation/Gait Assistance  6: Modified independent (Device/Increase time);5: Supervision    Ambulation/Gait Assistance Details  Went back and assessed gait with upwalker as it has been several sessions.  Min cues for posture and postiioning inside of walker, but otherwise note marked improvement in stability and safety.   Then trialed use of B trekking poles which pt did well with, however had difficulty maintaining sequencing.  Pt reports this being very difficult to coordinate and continue to note decreaesd balance if PT stopped him to provide cue.      Ambulation Distance (Feet)  230 Feet   then another 230   Assistive device  --   up walker and then trekking poles   Gait Pattern  Step-through pattern;Decreased arm swing - right;Decreased arm swing - left;Decreased stride length;Decreased step length - left;Decreased step length - right;Decreased hip/knee flexion - right;Decreased hip/knee flexion - left;Decreased trunk rotation;Trunk flexed;Wide base of support    Ambulation Surface  Level;Indoor    Gait Comments  BP following gait with trekking poles 142/83      Neuro Re-ed    Neuro Re-ed Details   High level balance in // bars to address ankle and hip strategy:  On small rocker board biased vertically with feet apart maintaining balance with EO x 20 secs (2 sets), EC x 3 sets of 20 secs.  Note heavy posterior bias with difficulty performing forward weight shift, therefore transitioned to ramp surface facing incline with feet apart x 20 secs, feet narrowed x 20 secs, marching x 2 sets of 10 reps, modified tandem x 20 secs each direction then narrowing tandem x 20 secs each.  Added soft therapy mat to incline and performed feet apart EO maintaining balance x 20 secs, feet apart EO with head turns side/side x 10  reps, up/down x 10 reps all with intermittent min A due to posterior LOB.  With cuing, pt demos improved elicitation of hip strategy.       Exercises   Other Exercises   Seated nustep x 5 mins with BUEs/LEs x 5 mins at level 4 resistance with cues to maintain rpms in 60's.              PT Education - 04/29/19 1331    Education Details  Provided order for upwalker and educated that PT called [employer] and spoke  with equipment benefits person and they report he will only have to pay 20% co insurance on up walker.  Provided name of [facility] discount medical as previous pt has purchased from them.    Person(s) Educated  Patient    Methods  Explanation;Handout    Comprehension  Verbalized understanding       PT Short Term Goals - 04/19/19 1326      PT SHORT TERM GOAL #1   Title  Pt is independent with initial HEP  (ALL STG due by 04/19/2019)    Baseline  met per report    Time  4    Period  Weeks    Status  Achieved    Target Date  04/19/19      PT SHORT TERM GOAL #2   Title  Pt will participate in further balance assessments with LTG to be reset: BERG and five time sit to stand    Baseline  BERG balance went from 37/56 to 38/56, did improve 5TSS however needed BUE support to do so.    Time  4    Period  Weeks    Status  Achieved    Target Date  04/19/19      PT SHORT TERM GOAL #3   Title  Patient will participate in full vestibular evaluation with LTG set if needed and will initiate vestibular HEP if needed    Baseline  partially met 04/19/2019    Time  4    Period  Weeks    Status  Partially Met    Target Date  04/19/19      PT SHORT TERM GOAL #4   Title  Pt will report being able to stand for >10-15 minutes at home to perform housework such as washing dishes or do laundry with pain no >5/10    Baseline  met, made dinner the other night, 7/10 pain at most (but not until the very end of meal prep)    Time  4    Period  Weeks    Status  Achieved    Target Date  04/19/19       PT SHORT TERM GOAL #5   Title  Pt will report walking around neighborhood for up to 8-10 minutes with wife's supervision and LRAD with pain no >5/10    Baseline  Has not been able to ambulate for exercise in 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    Time  4    Period  Weeks    Status  Not Met    Target Date  04/19/19        PT Long Term Goals - 04/22/19 1600      PT LONG TERM GOAL #1   Title  Pt reports ability to ambulate >/= 30 minutes with LRAD without increase in pain. (all LTG target date: 05/19/19)    Baseline  03/19/19: no more than 10 minutes, uses walking stick    Time  8    Period  Weeks    Status  On-going      PT LONG TERM GOAL #2   Title  Pt demonstrates TUG </= 13 seconds to indicate lower fall risk.    Baseline  03/19/19: 17.33s    Time  8    Period  Weeks    Status  On-going      PT LONG TERM GOAL #3   Title  Pt will report  being able to stand >20 minutes at home to perform household chores with pain no >3/10    Time  8    Period  Weeks    Status  On-going      PT LONG TERM GOAL #4   Title  Will improve 5xSTS to 13 seconds or less    Baseline  17    Time  8    Period  Weeks    Status  Revised      PT LONG TERM GOAL #5   Title  Will improve BERG to at least 46 to show improved balance.    Baseline  37    Time  8    Period  Weeks    Status  Revised      PT LONG TERM GOAL #6   Title  Pt will demonstrate 25% greater lumbar ROM into extension and side flexion    Time  8    Period  Weeks    Status  On-going      PT LONG TERM GOAL #7   Title  Pt will demonstrate improved use of VOR as indicated by his ability to perform X1 viewing in standing x 2 minutes with no UE support    Time  8    Period  Weeks    Status  Revised    Target Date  05/18/18      PT LONG TERM GOAL #8   Title  He will improve [place] scale of kinesiophobia from 33 by 5 points to less than 28 to show less fear about pain and  disability.    Status  New            Plan - 04/29/19 1333    Clinical Impression Statement  Pt continues to be very unsteady and demonstrate posterior LOB with balance challenges during session.  He has very limited ankle and hip strategy but can elicit intermittently with cuing.  Feel that he will be safer with use of upwalker or even RW until he can get upwalker when feeling unsteady.  Pt verbalized understanding.    Personal Factors and Comorbidities  Age;Comorbidity 3+;Behavior Pattern;Fitness;Past/Current Experience;Time since onset of injury/illness/exacerbation    Comorbidities  monocular vertical diplopia, HTN, CAD s/p MI, CABG, neurogenic orthostatic hypotension, neuropathy, cervicogenic HA, microdiscectomy (2017, 2018)    Examination-Activity Limitations  Locomotion Level;Stairs;Stand    Examination-Participation Restrictions  Cleaning;Community Activity;Laundry;Yard Work    [employer]  Evolving/Moderate complexity    Rehab Potential  Fair   pt stated not wanting to do exercises at home   PT Frequency  2x / week    PT Duration  8 weeks    PT Treatment/Interventions  ADLs/Self Care Home Management;Canalith Repostioning;Gait training;Stair training;Therapeutic activities;Therapeutic exercise;Balance training;Patient/family education;Neuromuscular re-education;Manual techniques;Vestibular;Electrical Stimulation;Moist Heat;DME Instruction;Functional mobility training;Passive range of motion;Dry needling;Spinal Manipulations;Cryotherapy    PT Next Visit Plan  Was he able to get upwalker?? Multi-sensory balance training, balance reactions, balance with turning/pivoting, hip extension strengthening.  Progress vestibular exercises    PT Home Exercise Plan  Access Code: [email or web address]    Consulted and Agree with Plan of Care  Patient       Patient will benefit from skilled therapeutic intervention in order to improve the following deficits and impairments:  Abnormal  gait, Decreased range of motion, Difficulty walking, Dizziness, Decreased activity tolerance, Decreased endurance, Pain, Decreased balance, Decreased strength, Decreased mobility, Impaired flexibility, Impaired sensation  Visit Diagnosis: Other abnormalities of gait and mobility  Dizziness and  giddiness  Unsteadiness on feet  Muscle weakness (generalized)     Problem List Patient Active Problem List   Diagnosis Date Noted  . Personal history of malignant melanoma of skin   . Lumbar radiculopathy   . Heart murmur   . GERD (gastroesophageal reflux disease)   . Diminished hearing, bilateral   . Cataracts, bilateral   . Bladder neck obstruction   . Arthritis   . Constipation 12/12/2016  . Nausea 12/12/2016  . Coronary artery disease involving native coronary artery of native heart without angina pectoris 11/22/2016  . Dyslipidemia 11/22/2016  . History of coronary artery bypass surgery 11/22/2016  . Depression with anxiety   . Orthostasis 09/20/2016  . HNP (herniated nucleus pulposus), lumbar 02/02/2016  . Spinal stenosis of lumbar region 01/24/2016  . Sciatica, right side 01/24/2016  . Prediabetes 01/24/2016  . Otitis, externa, infective 01/24/2016  . Left lumbar radiculopathy 01/15/2016  . PVC's (premature ventricular contractions) 05/24/2015  . Peripheral polyneuropathy 03/06/2015  . BPPV (benign paroxysmal positional vertigo) 03/06/2015  . Headache 01/18/2015  . Vertigo 01/18/2015  . Allergic rhinitis 09/20/2014  . Dyspnea on exertion 08/10/2014  . Severe dizziness 08/10/2014  . Acute diverticulitis 05/18/2014  . Diarrhea 05/18/2014  . LLQ pain 05/13/2014  . Chronic meniscal tear of knee 09/22/2013  . Primary localized osteoarthrosis, lower leg 09/22/2013  . Neck pain, bilateral 12/18/2012  . Headache(784.0) 12/18/2012  . Binocular visual disturbance 12/18/2012  . Epiretinal membrane 12/17/2012  . Status post intraocular lens implant 12/17/2012  . Preventative  health care 12/02/2012  . Low back pain 12/02/2012  . Impaired glucose tolerance 12/02/2012  . Palpitations 10/09/2010  . B12 deficiency 08/30/2010  . Hearing loss 08/30/2010  . Tinnitus 08/30/2010  . Neurogenic orthostatic hypotension ([place]) 06/28/2009  . HYPERLIPIDEMIA TYPE IIB / III 05/15/2009  . CHEST PAIN UNSPECIFIED 11/23/2008  . Hypotension, unspecified 10/12/2008  . DIZZINESS 10/12/2008  . Shortness of breath 09/14/2008  . UNSPECIFIED ANEMIA 09/13/2008  . ANXIETY DEPRESSION 09/13/2008  . Old myocardial infarction 09/13/2008  . GASTROESOPHAGEAL REFLUX DISEASE, HX OF 09/13/2008  . LUMBAR RADICULOPATHY, LEFT 08/23/2008  . MELANOMA, SHOULDER 11/27/2006  . Essential hypertension 11/27/2006  . Coronary atherosclerosis 11/27/2006  . BPH associated with nocturia 11/27/2006  . ERECTILE DYSFUNCTION, ORGANIC 11/27/2006    Cameron Sprang, PT, MPT [facility] [address] [place], [place], [address] Phone: [phone]   Fax:  [phone] 04/29/19, 1:38 PM  Name: Troy Combs MRN: [id number] Date of Birth: 02-05-1932

## 2019-05-03 ENCOUNTER — Other Ambulatory Visit: Payer: Self-pay

## 2019-05-03 ENCOUNTER — Encounter: Payer: Self-pay | Admitting: Physical Therapy

## 2019-05-03 ENCOUNTER — Ambulatory Visit: Payer: Medicare Other | Attending: Family Medicine | Admitting: Physical Therapy

## 2019-05-03 DIAGNOSIS — R42 Dizziness and giddiness: Secondary | ICD-10-CM | POA: Diagnosis present

## 2019-05-03 DIAGNOSIS — G8929 Other chronic pain: Secondary | ICD-10-CM | POA: Diagnosis present

## 2019-05-03 DIAGNOSIS — M545 Low back pain, unspecified: Secondary | ICD-10-CM

## 2019-05-03 DIAGNOSIS — M6281 Muscle weakness (generalized): Secondary | ICD-10-CM | POA: Diagnosis present

## 2019-05-03 DIAGNOSIS — R2681 Unsteadiness on feet: Secondary | ICD-10-CM | POA: Diagnosis present

## 2019-05-03 DIAGNOSIS — R296 Repeated falls: Secondary | ICD-10-CM | POA: Insufficient documentation

## 2019-05-03 DIAGNOSIS — R2689 Other abnormalities of gait and mobility: Secondary | ICD-10-CM | POA: Diagnosis present

## 2019-05-03 NOTE — Therapy (Signed)
[facility] [address] [place], [place], [address] Phone: [phone]   Fax:  [phone]  Physical Therapy Treatment & 10th Visit Progress Note   Patient Details  Name: Troy Combs MRN: [id number] Date of Birth: 1932/04/20 Referring Provider (PT): Antony Contras MD   Progress Note  Reporting Period 03/19/2019 to 05/03/2019  See note below for Objective Data and Assessment of Progress/Goals.     Encounter Date: 05/03/2019  PT End of Session - 05/03/19 1318    Visit Number  10    Number of Visits  17    Date for PT Re-Evaluation  05/19/19    Authorization Type  [employer] Medicare - 10th visit PN    PT Start Time  1320    PT Stop Time  1405    PT Time Calculation (min)  45 min    Activity Tolerance  Patient tolerated treatment well    Behavior During Therapy  WFL for tasks assessed/performed       Past Medical History:  Diagnosis Date  . Allergic rhinitis 09/20/2014  . Anemia, unspecified   . Arthritis   . Bladder neck obstruction   . Cataracts, bilateral   . Coronary atherosclerosis of unspecified type of vessel, native or graft   . Diminished hearing, bilateral   . Dysthymic disorder   . GERD (gastroesophageal reflux disease)   . Heart murmur    as a child  . Impaired glucose tolerance 12/02/2012  . Impotence of organic origin   . Lumbar radiculopathy   . Malignant melanoma of skin of upper limb, including shoulder ([place])   . Old myocardial infarction   . Personal history of malignant melanoma of skin   . Personal history of other diseases of digestive system   . Thoracic or lumbosacral neuritis or radiculitis, unspecified   . Unspecified essential hypertension     Past Surgical History:  Procedure Laterality Date  . ARTHRODESIS  07/06/2002   of left long finger distal interphalangeal joint with Kirschner wire fixation X 3  . ATRIAL ABLATION SURGERY  05/04/2001   Dr. [name]  . CARDIAC  CATHETERIZATION  09/05/2008   Revealing 4 of 4 patent grafts with native multivessel coronary artery disease, EF  of 60% without regional wall motion abnormalities.  Marland Kitchen CATARACT EXTRACTION, BILATERAL    . CHOLECYSTECTOMY    . COLONOSCOPY    . CORONARY ANGIOPLASTY  1993  . CORONARY ARTERY BYPASS GRAFT  10/17/2000   [name]. [name], [place]     AFTER LIVER TRAUMA AND HAND SURGERY  . EYE SURGERY    . HERNIA REPAIR     BILATERAL  . LEFT HEART CATH AND CORS/GRAFTS ANGIOGRAPHY N/A 04/03/2017   Procedure: LEFT HEART CATH AND CORS/GRAFTS ANGIOGRAPHY;  Surgeon: Leonie Man, MD;  Location: [facility];  Service: Cardiovascular;  Laterality: N/A;  . LUMBAR LAMINECTOMY/DECOMPRESSION MICRODISCECTOMY N/A 02/02/2016   Procedure: MICRODISCECTOMY LUMBAR FOUR- LUMBAR FIVE;  Surgeon: Ashok Pall, MD;  Location: [facility];  Service: Neurosurgery;  Laterality: N/A;  MICRODISCECTOMY L4-L5  . LUMBAR LAMINECTOMY/DECOMPRESSION MICRODISCECTOMY Right 08/02/2016   Procedure: MICRODISCECTOMY LUMBAR FOUR- LUMBAR FIVE RIGHT;  Surgeon: Ashok Pall, MD;  Location: [facility];  Service: Neurosurgery;  Laterality: Right;  . punctured eardrum      to relieve blood build up   for ear infection  . TONSILLECTOMY      There were no vitals filed for this visit.  Subjective Assessment -  05/03/19 1318    Subjective  Feeling unsteady today. No pain. Has not received UP walker yet - waiting on insurance and trying to figure out the best place to get the UP walker at best cost. Would utilize UP walker outside the house/in community but not in the house.    Pertinent History  monocular vertical diplopia, HTN, CAD s/p MI, CABG, neurogenic orthostatic hypotension, neuropathy, cervicogenic HA    Limitations  Standing;Walking    How long can you stand comfortably?  10 minutes    Patient Stated Goals  maintain high QOL, get back to doing household chores without pain, get out and walk, to feel safe walking/with  balance in order for him and his wife to get a dog    Currently in Pain?  No/denies    Pain Onset  More than a month ago         [facility] PT Assessment - 05/03/19 1423      Assessment   Medical Diagnosis  Lumbar spinal stenosis    Referring Provider (PT)  Antony Contras MD       Precautions   Precautions  Fall         Vestibular Assessment - 05/03/19 1409      Vestibular Assessment   General Observation  sitting posture slight cervical flexion to the R       Symptom Behavior   Subjective history of current problem  Pt reported vertical diplopia dx      Oculomotor Exam   Comment  + skew deviation L       Oculomotor Exam-Fixation Suppressed    Ocular Alignment  --      Other Tests   Comments  Pt reported vertical diplopia -- assessed + diplopia near and far distances except inferiorly                Vestibular Treatment/Exercise - 05/03/19 1324      Vestibular Treatment/Exercise   Vestibular Treatment Provided  Gaze    Gaze Exercises  X1 Viewing Horizontal;X1 Viewing Vertical      X1 Viewing Horizontal   Foot Position  standing with chair at side for stability > sitting with lower target no back support    Reps  2    Comments  x30s (~10 reps), mild symptom inc 3/10; x30s mild symptoms   baseline 2/10     X1 Viewing Vertical   Foot Position  standing with chair at side for stability > sitting without back support monocular vision     Reps  1    Comments  x30s (~13 reps), mild symptom inc to 4/10; monocular: 5 reps each no diplopia         Balance Exercises - 05/03/19 1411      Balance Exercises: Standing   Standing Eyes Opened  Narrow base of support (BOS);Head turns;Solid surface;5 reps   head/body turns R<>L, attempted diagonals inc 4/10 dizziness   Standing Eyes Closed  Wide (BOA);Head turns;Solid surface;Other reps (comment);5 reps   2x5 R<>L up/down with finger touch for proprioception   Other Standing Exercises  Standing at counter with  bilateral finger touch for proprioception slow toe taps for inc SLS x10 reps     Other Standing Exercises Comments  posterior LOB into wall with vertical head turns; minA required with toe taps at counter for stability          PT Short Term Goals - 04/19/19 1326      PT SHORT TERM GOAL #  1   Title  Pt is independent with initial HEP  (ALL STG due by 04/19/2019)    Baseline  met per report    Time  4    Period  Weeks    Status  Achieved    Target Date  04/19/19      PT SHORT TERM GOAL #2   Title  Pt will participate in further balance assessments with LTG to be reset: BERG and five time sit to stand    Baseline  BERG balance went from 37/56 to 38/56, did improve 5TSS however needed BUE support to do so.    Time  4    Period  Weeks    Status  Achieved    Target Date  04/19/19      PT SHORT TERM GOAL #3   Title  Patient will participate in full vestibular evaluation with LTG set if needed and will initiate vestibular HEP if needed    Baseline  partially met 04/19/2019    Time  4    Period  Weeks    Status  Partially Met    Target Date  04/19/19      PT SHORT TERM GOAL #4   Title  Pt will report being able to stand for >10-15 minutes at home to perform housework such as washing dishes or do laundry with pain no >5/10    Baseline  met, made dinner the other night, 7/10 pain at most (but not until the very end of meal prep)    Time  4    Period  Weeks    Status  Achieved    Target Date  04/19/19      PT SHORT TERM GOAL #5   Title  Pt will report walking around neighborhood for up to 8-10 minutes with wife's supervision and LRAD with pain no >5/10    Baseline  Has not been able to ambulate for exercise in 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    Time  4    Period  Weeks    Status  Not Met    Target Date  04/19/19        PT Long Term Goals - 04/22/19 1600      PT LONG TERM GOAL #1   Title  Pt reports ability to  ambulate >/= 30 minutes with LRAD without increase in pain. (all LTG target date: 05/19/19)    Baseline  03/19/19: no more than 10 minutes, uses walking stick    Time  8    Period  Weeks    Status  On-going      PT LONG TERM GOAL #2   Title  Pt demonstrates TUG </= 13 seconds to indicate lower fall risk.    Baseline  03/19/19: 17.33s    Time  8    Period  Weeks    Status  On-going      PT LONG TERM GOAL #3   Title  Pt will report being able to stand >20 minutes at home to perform household chores with pain no >3/10    Time  8    Period  Weeks    Status  On-going      PT LONG TERM GOAL #4   Title  Will improve 5xSTS to 13 seconds or less    Baseline  17    Time  8    Period  Weeks  Status  Revised      PT LONG TERM GOAL #5   Title  Will improve BERG to at least 46 to show improved balance.    Baseline  37    Time  8    Period  Weeks    Status  Revised      PT LONG TERM GOAL #6   Title  Pt will demonstrate 25% greater lumbar ROM into extension and side flexion    Time  8    Period  Weeks    Status  On-going      PT LONG TERM GOAL #7   Title  Pt will demonstrate improved use of VOR as indicated by his ability to perform X1 viewing in standing x 2 minutes with no UE support    Time  8    Period  Weeks    Status  Revised    Target Date  05/18/18      PT LONG TERM GOAL #8   Title  He will improve [place] scale of kinesiophobia from 33 by 5 points to less than 28 to show less fear about pain and disability.    Status  New            Plan - 05/03/19 1318    Clinical Impression Statement  He has achieved 3/5 STGs and is making slow progress towards functional LTGs. In previous session on 04/22/19, pt participated in formal vestibular assessment revealing mild bilateral vestibular hypofunction, disequilibrium, and mild motion sensitivity.  Pt came in with increased reported of unsteadiness and dizziness since waking up this morning. Session focused on adjusting x1  viewing exercise and continuation of balance activities. He reported vertical diplopia - assessed revealed diplopia near and far distances in all directions except inferiorly. Regressed x1 viewing to seated x30s with target slightly more inferior in orientation to accommodate diplopia. Took out x1 viewing vertically d/t diplopia and pt's lenses. Pt tolerated standing balance exercises well with only mild increases in dizziness. He did not tolerate head/body turns in diagonal pattern increasing his dizziness to 6/10 in 2 repetitions needing to stop the exercise.  He required light finger touch for proprioceptive input when eyes closed and had difficulty with activity that increased single leg stance time. Pt can benefit from continued skilled therapy services to address deficits and progress to achieving functional goals.    Personal Factors and Comorbidities  Age;Comorbidity 3+;Behavior Pattern;Fitness;Past/Current Experience;Time since onset of injury/illness/exacerbation    Comorbidities  monocular vertical diplopia, HTN, CAD s/p MI, CABG, neurogenic orthostatic hypotension, neuropathy, cervicogenic HA, microdiscectomy (2017, 2018)    Examination-Activity Limitations  Locomotion Level;Stairs;Stand    Examination-Participation Restrictions  Cleaning;Community Activity;Laundry;Yard Work    [employer]  Evolving/Moderate complexity    Rehab Potential  Fair   pt stated not wanting to do exercises at home   PT Frequency  2x / week    PT Duration  8 weeks    PT Treatment/Interventions  ADLs/Self Care Home Management;Canalith Repostioning;Gait training;Stair training;Therapeutic activities;Therapeutic exercise;Balance training;Patient/family education;Neuromuscular re-education;Manual techniques;Vestibular;Electrical Stimulation;Moist Heat;DME Instruction;Functional mobility training;Passive range of motion;Dry needling;Spinal Manipulations;Cryotherapy    PT Next Visit Plan  Was he able  to get upwalker?? Multi-sensory balance training, balance reactions, balance with turning/pivoting, hip extension strengthening.  Progress vestibular exercises    PT Home Exercise Plan  Access Code: [email or web address]    [email or web address] and Agree with Plan of Care  Patient       Patient will benefit from skilled therapeutic intervention in  order to improve the following deficits and impairments:  Abnormal gait, Decreased range of motion, Difficulty walking, Dizziness, Decreased activity tolerance, Decreased endurance, Pain, Decreased balance, Decreased strength, Decreased mobility, Impaired flexibility, Impaired sensation  Visit Diagnosis: Other abnormalities of gait and mobility  Dizziness and giddiness  Unsteadiness on feet  Muscle weakness (generalized)  Chronic midline low back pain without sciatica  Repeated falls     Problem List Patient Active Problem List   Diagnosis Date Noted  . Personal history of malignant melanoma of skin   . Lumbar radiculopathy   . Heart murmur   . GERD (gastroesophageal reflux disease)   . Diminished hearing, bilateral   . Cataracts, bilateral   . Bladder neck obstruction   . Arthritis   . Constipation 12/12/2016  . Nausea 12/12/2016  . Coronary artery disease involving native coronary artery of native heart without angina pectoris 11/22/2016  . Dyslipidemia 11/22/2016  . History of coronary artery bypass surgery 11/22/2016  . Depression with anxiety   . Orthostasis 09/20/2016  . HNP (herniated nucleus pulposus), lumbar 02/02/2016  . Spinal stenosis of lumbar region 01/24/2016  . Sciatica, right side 01/24/2016  . Prediabetes 01/24/2016  . Otitis, externa, infective 01/24/2016  . Left lumbar radiculopathy 01/15/2016  . PVC's (premature ventricular contractions) 05/24/2015  . Peripheral polyneuropathy 03/06/2015  . BPPV (benign paroxysmal positional vertigo) 03/06/2015  . Headache 01/18/2015  . Vertigo 01/18/2015  . Allergic rhinitis 09/20/2014  .  Dyspnea on exertion 08/10/2014  . Severe dizziness 08/10/2014  . Acute diverticulitis 05/18/2014  . Diarrhea 05/18/2014  . LLQ pain 05/13/2014  . Chronic meniscal tear of knee 09/22/2013  . Primary localized osteoarthrosis, lower leg 09/22/2013  . Neck pain, bilateral 12/18/2012  . Headache(784.0) 12/18/2012  . Binocular visual disturbance 12/18/2012  . Epiretinal membrane 12/17/2012  . Status post intraocular lens implant 12/17/2012  . Preventative health care 12/02/2012  . Low back pain 12/02/2012  . Impaired glucose tolerance 12/02/2012  . Palpitations 10/09/2010  . B12 deficiency 08/30/2010  . Hearing loss 08/30/2010  . Tinnitus 08/30/2010  . Neurogenic orthostatic hypotension ([place]) 06/28/2009  . HYPERLIPIDEMIA TYPE IIB / III 05/15/2009  . CHEST PAIN UNSPECIFIED 11/23/2008  . Hypotension, unspecified 10/12/2008  . DIZZINESS 10/12/2008  . Shortness of breath 09/14/2008  . UNSPECIFIED ANEMIA 09/13/2008  . ANXIETY DEPRESSION 09/13/2008  . Old myocardial infarction 09/13/2008  . GASTROESOPHAGEAL REFLUX DISEASE, HX OF 09/13/2008  . LUMBAR RADICULOPATHY, LEFT 08/23/2008  . MELANOMA, SHOULDER 11/27/2006  . Essential hypertension 11/27/2006  . Coronary atherosclerosis 11/27/2006  . BPH associated with nocturia 11/27/2006  . ERECTILE DYSFUNCTION, ORGANIC 11/27/2006    Juliann Pulse SPT 05/03/2019, 2:25 PM  [facility] [address] [place], [place], [address] Phone: [phone]   Fax:  [phone]  Name: Troy Combs MRN: [id number] Date of Birth: 1931-09-23

## 2019-05-03 NOTE — Patient Instructions (Addendum)
Multiple changes to x1 viewing due to vertical diplopia and having two different lenses in eyes (one for distance, one for close up)  Gaze Stabilization: Sitting    Move Letter card slightly lower than normal so that your eyes are looking slightly downwards.    Keeping eyes on target on wall 5-6 feet away, and move head side to side for _30 seconds. Perform side to side only.  Do NOT perform head up and down.  Perform 2 sets x 30 seconds.  Gaze Stabilization: Tip Card  1.Target must remain in focus, not blurry, and appear stationary while head is in motion. 2.Perform exercises with small head movements (45 to either side of midline). 3.Increase speed of head motion so long as target is in focus. 4.If you wear eyeglasses, be sure you can see target through lens (therapist will give specific instructions for bifocal / progressive lenses). 5.These exercises may provoke dizziness or nausea. Work through these symptoms. If too dizzy, slow head movement slightly. Rest between each exercise. 6.Exercises demand concentration; avoid distractions.  Copyright  VHI. All rights reserved.

## 2019-05-06 ENCOUNTER — Encounter: Payer: Self-pay | Admitting: Physical Therapy

## 2019-05-06 ENCOUNTER — Ambulatory Visit: Payer: Medicare Other | Admitting: Physical Therapy

## 2019-05-06 ENCOUNTER — Other Ambulatory Visit: Payer: Self-pay

## 2019-05-06 DIAGNOSIS — R2689 Other abnormalities of gait and mobility: Secondary | ICD-10-CM

## 2019-05-06 DIAGNOSIS — R2681 Unsteadiness on feet: Secondary | ICD-10-CM

## 2019-05-06 DIAGNOSIS — R42 Dizziness and giddiness: Secondary | ICD-10-CM

## 2019-05-06 NOTE — Therapy (Signed)
[facility] [address] [place], [place], [address] Phone: [phone]   Fax:  [phone]  Physical Therapy Treatment  Patient Details  Name: Troy Combs MRN: [id number] Date of Birth: 10-Sep-1931 Referring Provider (PT): Antony Contras MD    Encounter Date: 05/06/2019  PT End of Session - 05/06/19 2032    Visit Number  11    Number of Visits  17    Date for PT Re-Evaluation  05/19/19    Authorization Type  [employer] Medicare - 10th visit PN    PT Start Time  1402    PT Stop Time  1444    PT Time Calculation (min)  42 min    Activity Tolerance  Patient tolerated treatment well    Behavior During Therapy  [facility] for tasks assessed/performed       Past Medical History:  Diagnosis Date  . Allergic rhinitis 09/20/2014  . Anemia, unspecified   . Arthritis   . Bladder neck obstruction   . Cataracts, bilateral   . Coronary atherosclerosis of unspecified type of vessel, native or graft   . Diminished hearing, bilateral   . Dysthymic disorder   . GERD (gastroesophageal reflux disease)   . Heart murmur    as a child  . Impaired glucose tolerance 12/02/2012  . Impotence of organic origin   . Lumbar radiculopathy   . Malignant melanoma of skin of upper limb, including shoulder ([place])   . Old myocardial infarction   . Personal history of malignant melanoma of skin   . Personal history of other diseases of digestive system   . Thoracic or lumbosacral neuritis or radiculitis, unspecified   . Unspecified essential hypertension     Past Surgical History:  Procedure Laterality Date  . ARTHRODESIS  07/06/2002   of left long finger distal interphalangeal joint with Kirschner wire fixation X 3  . ATRIAL ABLATION SURGERY  05/04/2001   Dr. [name]  . CARDIAC CATHETERIZATION  09/05/2008   Revealing 4 of 4 patent grafts with native multivessel coronary artery disease, EF  of 60% without regional wall motion abnormalities.  Marland Kitchen  CATARACT EXTRACTION, BILATERAL    . CHOLECYSTECTOMY    . COLONOSCOPY    . CORONARY ANGIOPLASTY  1993  . CORONARY ARTERY BYPASS GRAFT  10/17/2000   [name]. [name], [place]     AFTER LIVER TRAUMA AND HAND SURGERY  . EYE SURGERY    . HERNIA REPAIR     BILATERAL  . LEFT HEART CATH AND CORS/GRAFTS ANGIOGRAPHY N/A 04/03/2017   Procedure: LEFT HEART CATH AND CORS/GRAFTS ANGIOGRAPHY;  Surgeon: Leonie Man, MD;  Location: [facility];  Service: Cardiovascular;  Laterality: N/A;  . LUMBAR LAMINECTOMY/DECOMPRESSION MICRODISCECTOMY N/A 02/02/2016   Procedure: MICRODISCECTOMY LUMBAR FOUR- LUMBAR FIVE;  Surgeon: Ashok Pall, MD;  Location: [facility];  Service: Neurosurgery;  Laterality: N/A;  MICRODISCECTOMY L4-L5  . LUMBAR LAMINECTOMY/DECOMPRESSION MICRODISCECTOMY Right 08/02/2016   Procedure: MICRODISCECTOMY LUMBAR FOUR- LUMBAR FIVE RIGHT;  Surgeon: Ashok Pall, MD;  Location: [facility];  Service: Neurosurgery;  Laterality: Right;  . punctured eardrum      to relieve blood build up   for ear infection  . TONSILLECTOMY      There were no vitals filed for this visit.  Subjective Assessment - 05/06/19 1404    Subjective  This is just a bad day for dizziness.  Tried doing the eye exercises over the weekend and they went pretty  well.  Today, if I move my eyes too fast, I might fall over.  Talked to daughter about the UP walker and still waiting to get that.    Pertinent History  monocular vertical diplopia, HTN, CAD s/p MI, CABG, neurogenic orthostatic hypotension, neuropathy, cervicogenic HA    Limitations  Standing;Walking    How long can you stand comfortably?  10 minutes    Patient Stated Goals  maintain high QOL, get back to doing household chores without pain, get out and walk, to feel safe walking/with balance in order for him and his wife to get a dog    Currently in Pain?  Yes    Pain Score  5     Pain Location  Back    Pain Orientation  Lower    Pain Descriptors  / Indicators  Sharp    Pain Type  Chronic pain    Pain Onset  More than a month ago    Pain Frequency  Constant    Aggravating Factors   woke up this morning with the pain    Pain Relieving Factors  Medications, heating pad                       [facility] Adult PT Treatment/Exercise - 05/06/19 1408      Transfers   Transfers  Sit to Stand;Stand to Sit    Sit to Stand  6: Modified independent (Device/Increase time);With upper extremity assist;From bed    Stand to Sit  6: Modified independent (Device/Increase time);With upper extremity assist;To bed      Ambulation/Gait   Ambulation/Gait  Yes    Ambulation/Gait Assistance  6: Modified independent (Device/Increase time);5: Supervision    Ambulation/Gait Assistance Details  With  use of UpWalker, adjusted height to 12 or 13 as height of UEs; cues for patient to look ahead at visual target.  Pt able to self-correct postural with UpWalker; cues to avoid elevating shoulders.    Ambulation Distance (Feet)  345 Feet   then 160 ft x 4 reps with UpWalker   Assistive device  --   UpWalker; trialed B trekking poles   Gait Pattern  Step-through pattern;Decreased arm swing - right;Decreased arm swing - left;Decreased stride length;Decreased step length - left;Decreased step length - right;Decreased hip/knee flexion - right;Decreased hip/knee flexion - left;Decreased trunk rotation;Trunk flexed;Wide base of support    Ambulation Surface  Level;Indoor    Gait Comments  After first two bouts of gait, pt reports decreased sensation of dizziness (3/10, compared to 5/10 initially).  He also reports decreased back pain to 1/10.  Discussed using short bouts of gait throughout the day, once he receives his UpWalker, as a way to manage dizziness and back pain.  Trialed gait with bilateral walking poles with cues and hand over hand cues for sequencing, x 100 ft, but pt feels he would have to think too much to sequence bilateral walking poles properly.   Trialed short distance gait with pt's single walking pole, 30 ft x 3 reps, with close supervision, as when pt nears turn, he stops briefly due to feeling "pulled to right" like he might fall.  Discussed using his current walker at home until he receives UpWalker due to safety concerns with balance/dizziness with single walking pole.                 PT Short Term Goals - 04/19/19 1326      PT SHORT TERM GOAL #1  Title  Pt is independent with initial HEP  (ALL STG due by 04/19/2019)    Baseline  met per report    Time  4    Period  Weeks    Status  Achieved    Target Date  04/19/19      PT SHORT TERM GOAL #2   Title  Pt will participate in further balance assessments with LTG to be reset: BERG and five time sit to stand    Baseline  BERG balance went from 37/56 to 38/56, did improve 5TSS however needed BUE support to do so.    Time  4    Period  Weeks    Status  Achieved    Target Date  04/19/19      PT SHORT TERM GOAL #3   Title  Patient will participate in full vestibular evaluation with LTG set if needed and will initiate vestibular HEP if needed    Baseline  partially met 04/19/2019    Time  4    Period  Weeks    Status  Partially Met    Target Date  04/19/19      PT SHORT TERM GOAL #4   Title  Pt will report being able to stand for >10-15 minutes at home to perform housework such as washing dishes or do laundry with pain no >5/10    Baseline  met, made dinner the other night, 7/10 pain at most (but not until the very end of meal prep)    Time  4    Period  Weeks    Status  Achieved    Target Date  04/19/19      PT SHORT TERM GOAL #5   Title  Pt will report walking around neighborhood for up to 8-10 minutes with wife's supervision and LRAD with pain no >5/10    Baseline  Has not been able to ambulate for exercise in 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    Time  4    Period  Weeks    Status  Not Met     Target Date  04/19/19        PT Long Term Goals - 04/22/19 1600      PT LONG TERM GOAL #1   Title  Pt reports ability to ambulate >/= 30 minutes with LRAD without increase in pain. (all LTG target date: 05/19/19)    Baseline  03/19/19: no more than 10 minutes, uses walking stick    Time  8    Period  Weeks    Status  On-going      PT LONG TERM GOAL #2   Title  Pt demonstrates TUG </= 13 seconds to indicate lower fall risk.    Baseline  03/19/19: 17.33s    Time  8    Period  Weeks    Status  On-going      PT LONG TERM GOAL #3   Title  Pt will report being able to stand >20 minutes at home to perform household chores with pain no >3/10    Time  8    Period  Weeks    Status  On-going      PT LONG TERM GOAL #4   Title  Will improve 5xSTS to 13 seconds or less    Baseline  17    Time  8    Period  Weeks    Status  Revised      PT LONG TERM GOAL #5   Title  Will improve BERG to at least 46 to show improved balance.    Baseline  37    Time  8    Period  Weeks    Status  Revised      PT LONG TERM GOAL #6   Title  Pt will demonstrate 25% greater lumbar ROM into extension and side flexion    Time  8    Period  Weeks    Status  On-going      PT LONG TERM GOAL #7   Title  Pt will demonstrate improved use of VOR as indicated by his ability to perform X1 viewing in standing x 2 minutes with no UE support    Time  8    Period  Weeks    Status  Revised    Target Date  05/18/18      PT LONG TERM GOAL #8   Title  He will improve [place] scale of kinesiophobia from 33 by 5 points to less than 28 to show less fear about pain and disability.    Status  New            Plan - 05/06/19 2033    Clinical Impression Statement  Verbally reviewed/briefly returned demo of seated x1 viewing exercise, bringing on additional dizziness today (pt does report doing this over the weekend without difficulty).  Focus of today's session to work on short bouts of gait using UpWalker (pt reports  going today or tomorrow to [facility] to obtain) for improved steadiness and posture with gait.  With bouts of giat during PT session today, pt reports decreased dizziness, decreased back pain; once he ambulates with single or bilateral walking poles, he demonstrates and reports increased unsteadiness.  He declines further balance, head motion work as he drove himself to therapy and wants to be able to drive home.    Personal Factors and Comorbidities  Age;Comorbidity 3+;Behavior Pattern;Fitness;Past/Current Experience;Time since onset of injury/illness/exacerbation    Comorbidities  monocular vertical diplopia, HTN, CAD s/p MI, CABG, neurogenic orthostatic hypotension, neuropathy, cervicogenic HA, microdiscectomy (2017, 2018)    Examination-Activity Limitations  Locomotion Level;Stairs;Stand    Examination-Participation Restrictions  Cleaning;Community Activity;Laundry;Yard Work    [employer]  Evolving/Moderate complexity    Rehab Potential  Fair   pt stated not wanting to do exercises at home   PT Frequency  2x / week    PT Duration  8 weeks    PT Treatment/Interventions  ADLs/Self Care Home Management;Canalith Repostioning;Gait training;Stair training;Therapeutic activities;Therapeutic exercise;Balance training;Patient/family education;Neuromuscular re-education;Manual techniques;Vestibular;Electrical Stimulation;Moist Heat;DME Instruction;Functional mobility training;Passive range of motion;Dry needling;Spinal Manipulations;Cryotherapy    PT Next Visit Plan  Check on if he was able to get upwalker?? Multi-sensory balance training, balance reactions, balance with turning/pivoting, hip extension strengthening.  Progress vestibular exercises    PT Home Exercise Plan  Access Code: [email or web address]    Consulted and Agree with Plan of Care  Patient       Patient will benefit from skilled therapeutic intervention in order to improve the following deficits and impairments:   Abnormal gait, Decreased range of motion, Difficulty walking, Dizziness, Decreased activity tolerance, Decreased endurance, Pain, Decreased balance, Decreased strength, Decreased mobility, Impaired flexibility, Impaired sensation  Visit Diagnosis: Other abnormalities of gait and mobility  Dizziness and giddiness  Unsteadiness on feet     Problem List Patient Active Problem List   Diagnosis Date Noted  .  Personal history of malignant melanoma of skin   . Lumbar radiculopathy   . Heart murmur   . GERD (gastroesophageal reflux disease)   . Diminished hearing, bilateral   . Cataracts, bilateral   . Bladder neck obstruction   . Arthritis   . Constipation 12/12/2016  . Nausea 12/12/2016  . Coronary artery disease involving native coronary artery of native heart without angina pectoris 11/22/2016  . Dyslipidemia 11/22/2016  . History of coronary artery bypass surgery 11/22/2016  . Depression with anxiety   . Orthostasis 09/20/2016  . HNP (herniated nucleus pulposus), lumbar 02/02/2016  . Spinal stenosis of lumbar region 01/24/2016  . Sciatica, right side 01/24/2016  . Prediabetes 01/24/2016  . Otitis, externa, infective 01/24/2016  . Left lumbar radiculopathy 01/15/2016  . PVC's (premature ventricular contractions) 05/24/2015  . Peripheral polyneuropathy 03/06/2015  . BPPV (benign paroxysmal positional vertigo) 03/06/2015  . Headache 01/18/2015  . Vertigo 01/18/2015  . Allergic rhinitis 09/20/2014  . Dyspnea on exertion 08/10/2014  . Severe dizziness 08/10/2014  . Acute diverticulitis 05/18/2014  . Diarrhea 05/18/2014  . LLQ pain 05/13/2014  . Chronic meniscal tear of knee 09/22/2013  . Primary localized osteoarthrosis, lower leg 09/22/2013  . Neck pain, bilateral 12/18/2012  . Headache(784.0) 12/18/2012  . Binocular visual disturbance 12/18/2012  . Epiretinal membrane 12/17/2012  . Status post intraocular lens implant 12/17/2012  . Preventative health care 12/02/2012   . Low back pain 12/02/2012  . Impaired glucose tolerance 12/02/2012  . Palpitations 10/09/2010  . B12 deficiency 08/30/2010  . Hearing loss 08/30/2010  . Tinnitus 08/30/2010  . Neurogenic orthostatic hypotension ([place]) 06/28/2009  . HYPERLIPIDEMIA TYPE IIB / III 05/15/2009  . CHEST PAIN UNSPECIFIED 11/23/2008  . Hypotension, unspecified 10/12/2008  . DIZZINESS 10/12/2008  . Shortness of breath 09/14/2008  . UNSPECIFIED ANEMIA 09/13/2008  . ANXIETY DEPRESSION 09/13/2008  . Old myocardial infarction 09/13/2008  . GASTROESOPHAGEAL REFLUX DISEASE, HX OF 09/13/2008  . LUMBAR RADICULOPATHY, LEFT 08/23/2008  . MELANOMA, SHOULDER 11/27/2006  . Essential hypertension 11/27/2006  . Coronary atherosclerosis 11/27/2006  . BPH associated with nocturia 11/27/2006  . ERECTILE DYSFUNCTION, ORGANIC 11/27/2006    Tiron Suski W. 05/06/2019, 8:41 PM  Frazier Butt., PT   [facility] [address] [place], [place], [address] Phone: [phone]   Fax:  [phone]  Name: Troy Combs MRN: [id number] Date of Birth: 1932/02/08

## 2019-05-10 ENCOUNTER — Ambulatory Visit: Payer: Medicare Other | Admitting: Physical Therapy

## 2019-05-13 ENCOUNTER — Other Ambulatory Visit: Payer: Self-pay

## 2019-05-13 ENCOUNTER — Ambulatory Visit: Payer: Medicare Other | Admitting: Physical Therapy

## 2019-05-13 ENCOUNTER — Encounter: Payer: Self-pay | Admitting: Physical Therapy

## 2019-05-13 DIAGNOSIS — R2689 Other abnormalities of gait and mobility: Secondary | ICD-10-CM | POA: Diagnosis not present

## 2019-05-13 DIAGNOSIS — R42 Dizziness and giddiness: Secondary | ICD-10-CM

## 2019-05-13 DIAGNOSIS — R2681 Unsteadiness on feet: Secondary | ICD-10-CM

## 2019-05-13 DIAGNOSIS — M6281 Muscle weakness (generalized): Secondary | ICD-10-CM

## 2019-05-14 NOTE — Therapy (Signed)
[employer] [address] [place], [place], [address] Phone: [phone]   Fax:  [phone]  Physical Therapy Treatment  Patient Details  Name: Troy Combs MRN: [id number] Date of Birth: [birth year] Referring Provider (PT): Antony Contras MD    Encounter Date: 05/13/2019   05/13/19 1236  PT Visits / Re-Eval  Visit Number 12  Number of Visits 17  Date for PT Re-Evaluation 05/19/19  Authorization  Authorization Type [employer] Medicare - 10th visit PN  PT Time Calculation  PT Start Time 1233  PT Stop Time 1315  PT Time Calculation (min) 42 min  PT - End of Session  Equipment Utilized During Treatment Gait belt  Activity Tolerance Patient tolerated treatment well;Patient limited by fatigue  Behavior During Therapy [facility] for tasks assessed/performed      Past Medical History:  Diagnosis Date  . Allergic rhinitis 09/20/2014  . Anemia, unspecified   . Arthritis   . Bladder neck obstruction   . Cataracts, bilateral   . Coronary atherosclerosis of unspecified type of vessel, native or graft   . Diminished hearing, bilateral   . Dysthymic disorder   . GERD (gastroesophageal reflux disease)   . Heart murmur    as a child  . Impaired glucose tolerance 12/02/2012  . Impotence of organic origin   . Lumbar radiculopathy   . Malignant melanoma of skin of upper limb, including shoulder ([place])   . Old myocardial infarction   . Personal history of malignant melanoma of skin   . Personal history of other diseases of digestive system   . Thoracic or lumbosacral neuritis or radiculitis, unspecified   . Unspecified essential hypertension     Past Surgical History:  Procedure Laterality Date  . ARTHRODESIS  07/06/2002   of left long finger distal interphalangeal joint with Kirschner wire fixation X 3  . ATRIAL ABLATION SURGERY  05/04/2001   Dr. [name]  . CARDIAC CATHETERIZATION  09/05/2008   Revealing 4 of 4 patent  grafts with native multivessel coronary artery disease, EF  of 60% without regional wall motion abnormalities.  Marland Kitchen CATARACT EXTRACTION, BILATERAL    . CHOLECYSTECTOMY    . COLONOSCOPY    . CORONARY ANGIOPLASTY  1993  . CORONARY ARTERY BYPASS GRAFT  10/17/2000   [name]. [name], [place]     AFTER LIVER TRAUMA AND HAND SURGERY  . EYE SURGERY    . HERNIA REPAIR     BILATERAL  . LEFT HEART CATH AND CORS/GRAFTS ANGIOGRAPHY N/A 04/03/2017   Procedure: LEFT HEART CATH AND CORS/GRAFTS ANGIOGRAPHY;  Surgeon: Leonie Man, MD;  Location: [facility];  Service: Cardiovascular;  Laterality: N/A;  . LUMBAR LAMINECTOMY/DECOMPRESSION MICRODISCECTOMY N/A 02/02/2016   Procedure: MICRODISCECTOMY LUMBAR FOUR- LUMBAR FIVE;  Surgeon: Ashok Pall, MD;  Location: [facility];  Service: Neurosurgery;  Laterality: N/A;  MICRODISCECTOMY L4-L5  . LUMBAR LAMINECTOMY/DECOMPRESSION MICRODISCECTOMY Right 08/02/2016   Procedure: MICRODISCECTOMY LUMBAR FOUR- LUMBAR FIVE RIGHT;  Surgeon: Ashok Pall, MD;  Location: [facility];  Service: Neurosurgery;  Laterality: Right;  . punctured eardrum      to relieve blood build up   for ear infection  . TONSILLECTOMY      There were no vitals filed for this visit.     05/13/19 1234  Symptoms/Limitations  Subjective Dizzzy today, 4-5/10 at this time. Having issues with the eye exercises. Gets the [employer].  Pertinent History monocular vertical diplopia, HTN, CAD s/p MI, CABG,  neurogenic orthostatic hypotension, neuropathy, cervicogenic HA  Limitations Standing;Walking  How long can you stand comfortably? 10 minutes  Patient Stated Goals maintain high QOL, get back to doing household chores without pain, get out and walk, to feel safe walking/with balance in order for him and his wife to get a dog  Pain Assessment  Currently in Pain? Yes  Pain Score 4  Pain Location Back  Pain Orientation Lower  Pain Descriptors / Indicators Sharp  Pain Type  Chronic pain  Pain Onset More than a month ago  Pain Frequency Constant  Aggravating Factors  bending over when standing  Pain Relieving Factors stopping the activity that is bothering him, heating pads, pain meds in evening      05/13/19 1249  Transfers  Transfers Sit to Stand;Stand to Sit  Sit to Stand 6: Modified independent (Device/Increase time);With upper extremity assist;From bed  Stand to Sit 6: Modified independent (Device/Increase time);With upper extremity assist;To bed  Ambulation/Gait  Ambulation/Gait Yes  Ambulation/Gait Assistance 6: Modified independent (Device/Increase time);5: Supervision  Ambulation/Gait Assistance Details cues for use of brakes on inclines/declines with assist to keep walker closer with gait   Ambulation Distance (Feet) 500 Feet  Assistive device  (Upwalker)  Gait Pattern Step-through pattern;Decreased arm swing - right;Decreased arm swing - left;Decreased stride length;Decreased step length - left;Decreased step length - right;Decreased hip/knee flexion - right;Decreased hip/knee flexion - left;Decreased trunk rotation;Trunk flexed;Wide base of support  Ambulation Surface Level;Indoor;Unlevel;Outdoor;Paved  Curb 4: Min assist  Curb Details (indicate cue type and reason) with Upwalker on outdoor curb- cues needed for technique/sequencing with ascending/descening the curb.   Gait Comments cues for safety with Upwalker- cues for use of brakes with sitting and standing up  High Level Balance  High Level Balance Activities Negotitating around obstacles  High Level Balance Comments with Upwalker- weaving between 3 stools with 180 degree turn at end for 2 laps with min guard assist.   Neuro Re-ed   Neuro Re-ed Details  reviewed seated x1 exercises due to pt reporting issues. Had pt perform them in session. Noted pt moving head in full range of motion at fast speed with lateral head movements. Had pt slow head movements down in smaller range with less  symptoms reported.  no issues with vertical head movements, however did have pt slow head movements down.       05/13/19 2235  PT Education  Education Details updated HEP for x1 eye exercises with more detailed directions after instruction in session today  Person(s) Educated Patient  Methods Explanation;Demonstration;Verbal cues;Handout  Comprehension Verbalized understanding;Returned demonstration;Verbal cues required;Need further instruction    PT Short Term Goals - 04/19/19 1326      PT SHORT TERM GOAL #1   Title  Pt is independent with initial HEP  (ALL STG due by 04/19/2019)    Baseline  met per report    Time  4    Period  Weeks    Status  Achieved    Target Date  04/19/19      PT SHORT TERM GOAL #2   Title  Pt will participate in further balance assessments with LTG to be reset: BERG and five time sit to stand    Baseline  BERG balance went from 37/56 to 38/56, did improve 5TSS however needed BUE support to do so.    Time  4    Period  Weeks    Status  Achieved    Target Date  04/19/19  PT SHORT TERM GOAL #3   Title  Patient will participate in full vestibular evaluation with LTG set if needed and will initiate vestibular HEP if needed    Baseline  partially met 04/19/2019    Time  4    Period  Weeks    Status  Partially Met    Target Date  04/19/19      PT SHORT TERM GOAL #4   Title  Pt will report being able to stand for >10-15 minutes at home to perform housework such as washing dishes or do laundry with pain no >5/10    Baseline  met, made dinner the other night, 7/10 pain at most (but not until the very end of meal prep)    Time  4    Period  Weeks    Status  Achieved    Target Date  04/19/19      PT SHORT TERM GOAL #5   Title  Pt will report walking around neighborhood for up to 8-10 minutes with wife's supervision and LRAD with pain no >5/10    Baseline  Has not been able to ambulate for exercise in last couple of weeks due to construction but feels  as though he wouldn't be able to do this.    Time  4    Period  Weeks    Status  Not Met    Target Date  04/19/19        PT Long Term Goals - 04/22/19 1600      PT LONG TERM GOAL #1   Title  Pt reports ability to ambulate >/= 30 minutes with LRAD without increase in pain. (all LTG target date: 05/19/19)    Baseline  03/19/19: no more than 10 minutes, uses walking stick    Time  8    Period  Weeks    Status  On-going      PT LONG TERM GOAL #2   Title  Pt demonstrates TUG </= 13 seconds to indicate lower fall risk.    Baseline  03/19/19: 17.33s    Time  8    Period  Weeks    Status  On-going      PT LONG TERM GOAL #3   Title  Pt will report being able to stand >20 minutes at home to perform household chores with pain no >3/10    Time  8    Period  Weeks    Status  On-going      PT LONG TERM GOAL #4   Title  Will improve 5xSTS to 13 seconds or less    Baseline  17    Time  8    Period  Weeks    Status  Revised      PT LONG TERM GOAL #5   Title  Will improve BERG to at least 46 to show improved balance.    Baseline  37    Time  8    Period  Weeks    Status  Revised      PT LONG TERM GOAL #6   Title  Pt will demonstrate 25% greater lumbar ROM into extension and side flexion    Time  8    Period  Weeks    Status  On-going      PT LONG TERM GOAL #7   Title  Pt will demonstrate improved use of VOR as indicated by his ability to perform X1 viewing in standing x 2 minutes with no  UE support    Time  8    Period  Weeks    Status  Revised    Target Date  05/18/18      PT LONG TERM GOAL #8   Title  He will improve [place] scale of kinesiophobia from 33 by 5 points to less than 28 to show less fear about pain and disability.    Status  New          05/13/19 1236  Plan  Clinical Impression Statement Today's skilled session initially focused on pt's x1 ex's at home as he reported issues with seeing double with the side to side head movements. On review in session pt  was moving his head in full range with increased speed. Had him slow his head movements down in a smaller range with pt reporting improvement. Re-issued handouts with these cues written in. Remainder of session addressed gait/barriers with Upright walker, including outdoor surfaces. Pt safe on paved surface with supervision. Will need additonal practice with curbs and inclines/declines. The pt is progressing toward goals and should benefit from continued PT to progress toward unmet goals.  Personal Factors and Comorbidities Age;Comorbidity 3+;Behavior Pattern;Fitness;Past/Current Experience;Time since onset of injury/illness/exacerbation  Comorbidities monocular vertical diplopia, HTN, CAD s/p MI, CABG, neurogenic orthostatic hypotension, neuropathy, cervicogenic HA, microdiscectomy (2017, 2018)  Examination-Activity Limitations Locomotion Level;Stairs;Stand  Examination-Participation Restrictions Cleaning;Community Activity;Laundry;Yard Work  Pt will benefit from skilled therapeutic intervention in order to improve on the following deficits Abnormal gait;Decreased range of motion;Difficulty walking;Dizziness;Decreased activity tolerance;Decreased endurance;Pain;Decreased balance;Decreased strength;Decreased mobility;Impaired flexibility;Impaired sensation  Stability/Clinical Decision Making Evolving/Moderate complexity  Rehab Potential Fair (pt stated not wanting to do exercises at home)  PT Frequency 2x / week  PT Duration 8 weeks  PT Treatment/Interventions ADLs/Self Care Home Management;Canalith Repostioning;Gait training;Stair training;Therapeutic activities;Therapeutic exercise;Balance training;Patient/family education;Neuromuscular re-education;Manual techniques;Vestibular;Electrical Stimulation;Moist Heat;DME Instruction;Functional mobility training;Passive range of motion;Dry needling;Spinal Manipulations;Cryotherapy  PT Next Visit Plan pt should have his Upwalker at next appt, and will bring it  to make sure heigh is adjusted correctly. needs to work on loading/unloading it from car with/with out spouse assist, continue to work on outdoor uneven/compliant surfaces, curbs and ramps with [employer]. 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 and progress vestibular ex's as able.  PT Home Exercise Plan Access Code: [email or web address]  OVZCHYIFO and Agree with Plan of Care Patient         Patient will benefit from skilled therapeutic intervention in order to improve the following deficits and impairments:  Abnormal gait, Decreased range of motion, Difficulty walking, Dizziness, Decreased activity tolerance, Decreased endurance, Pain, Decreased balance, Decreased strength, Decreased mobility, Impaired flexibility, Impaired sensation  Visit Diagnosis: Other abnormalities of gait and mobility  Dizziness and giddiness  Unsteadiness on feet  Muscle weakness (generalized)     Problem List Patient Active Problem List   Diagnosis Date Noted  . Personal history of malignant melanoma of skin   . Lumbar radiculopathy   . Heart murmur   . GERD (gastroesophageal reflux disease)   . Diminished hearing, bilateral   . Cataracts, bilateral   . Bladder neck obstruction   . Arthritis   . Constipation 12/12/2016  . Nausea 12/12/2016  . Coronary artery disease involving native coronary artery of native heart without angina pectoris 11/22/2016  . Dyslipidemia 11/22/2016  . History of coronary artery bypass surgery 11/22/2016  . Depression with anxiety   . Orthostasis 09/20/2016  . HNP (herniated nucleus pulposus), lumbar 02/02/2016  . Spinal stenosis of lumbar region 01/24/2016  .  Sciatica, right side 01/24/2016  . Prediabetes 01/24/2016  . Otitis, externa, infective 01/24/2016  . Left lumbar radiculopathy 01/15/2016  . PVC's (premature ventricular contractions) 05/24/2015  . Peripheral polyneuropathy 03/06/2015  . BPPV (benign paroxysmal positional vertigo) 03/06/2015  . Headache  01/18/2015  . Vertigo 01/18/2015  . Allergic rhinitis 09/20/2014  . Dyspnea on exertion 08/10/2014  . Severe dizziness 08/10/2014  . Acute diverticulitis 05/18/2014  . Diarrhea 05/18/2014  . LLQ pain 05/13/2014  . Chronic meniscal tear of knee 09/22/2013  . Primary localized osteoarthrosis, lower leg 09/22/2013  . Neck pain, bilateral 12/18/2012  . Headache(784.0) 12/18/2012  . Binocular visual disturbance 12/18/2012  . Epiretinal membrane 12/17/2012  . Status post intraocular lens implant 12/17/2012  . Preventative health care 12/02/2012  . Low back pain 12/02/2012  . Impaired glucose tolerance 12/02/2012  . Palpitations 10/09/2010  . B12 deficiency 08/30/2010  . Hearing loss 08/30/2010  . Tinnitus 08/30/2010  . Neurogenic orthostatic hypotension ([place]) 06/28/2009  . HYPERLIPIDEMIA TYPE IIB / III 05/15/2009  . CHEST PAIN UNSPECIFIED 11/23/2008  . Hypotension, unspecified 10/12/2008  . DIZZINESS 10/12/2008  . Shortness of breath 09/14/2008  . UNSPECIFIED ANEMIA 09/13/2008  . ANXIETY DEPRESSION 09/13/2008  . Old myocardial infarction 09/13/2008  . GASTROESOPHAGEAL REFLUX DISEASE, HX OF 09/13/2008  . LUMBAR RADICULOPATHY, LEFT 08/23/2008  . MELANOMA, SHOULDER 11/27/2006  . Essential hypertension 11/27/2006  . Coronary atherosclerosis 11/27/2006  . BPH associated with nocturia 11/27/2006  . ERECTILE DYSFUNCTION, ORGANIC 11/27/2006    Willow Ora, PTA, [facility] [address] [place], [place] [address] [phone] 05/14/19, 10:13 PM   Name: Troy Combs MRN: [id number] Date of Birth: Jul 07, 1931

## 2019-05-17 ENCOUNTER — Other Ambulatory Visit: Payer: Self-pay

## 2019-05-17 ENCOUNTER — Ambulatory Visit: Payer: Medicare Other | Admitting: Physical Therapy

## 2019-05-17 ENCOUNTER — Encounter: Payer: Self-pay | Admitting: Physical Therapy

## 2019-05-17 DIAGNOSIS — R2689 Other abnormalities of gait and mobility: Secondary | ICD-10-CM

## 2019-05-17 DIAGNOSIS — R296 Repeated falls: Secondary | ICD-10-CM

## 2019-05-17 DIAGNOSIS — R2681 Unsteadiness on feet: Secondary | ICD-10-CM

## 2019-05-17 DIAGNOSIS — M545 Low back pain, unspecified: Secondary | ICD-10-CM

## 2019-05-17 DIAGNOSIS — R42 Dizziness and giddiness: Secondary | ICD-10-CM

## 2019-05-17 DIAGNOSIS — G8929 Other chronic pain: Secondary | ICD-10-CM

## 2019-05-17 DIAGNOSIS — M6281 Muscle weakness (generalized): Secondary | ICD-10-CM

## 2019-05-17 NOTE — Patient Instructions (Signed)
D/C x1 viewing due to visual impairments.     Access Code: [email or web address]  URL: [email or web address]  Date: 04/22/2019  Prepared by: Misty Stanley   Exercises Supine Lower Trunk Rotation - 5 reps - 1 sets - 10 sec hold - 2x daily - 6x weekly Hooklying Single Knee to Chest Stretch - 2 reps - 30 hold - 2x daily - 6x weekly Staggered stance with Eyes Open - 2 sets - 10 hold - 2x daily - 6x weekly Standing Balance in Corner with Eyes Closed, Head turns and nods - 10 reps - 2 sets - 2x daily - 6x weekly Sit to Stand - 10 reps - 2-3 sets - 2x daily - 6x weekly

## 2019-05-18 NOTE — Therapy (Signed)
[facility] [address] [place], [place], [address] Phone: [phone]   Fax:  [phone]  Physical Therapy Treatment  Patient Details  Name: Troy Combs MRN: [id number] Date of Birth: [birth year] Referring Provider (PT): Antony Contras MD    Encounter Date: 05/17/2019  PT End of Session - 05/18/19 1628    Visit Number  13    Number of Visits  17    Date for PT Re-Evaluation  05/19/19    Authorization Type  [employer] Medicare - 10th visit PN    PT Start Time  5615    PT Stop Time  1530    PT Time Calculation (min)  43 min    Equipment Utilized During Treatment  Gait belt    Activity Tolerance  Patient tolerated treatment well;Patient limited by fatigue    Behavior During Therapy  [facility] for tasks assessed/performed       Past Medical History:  Diagnosis Date  . Allergic rhinitis 09/20/2014  . Anemia, unspecified   . Arthritis   . Bladder neck obstruction   . Cataracts, bilateral   . Coronary atherosclerosis of unspecified type of vessel, native or graft   . Diminished hearing, bilateral   . Dysthymic disorder   . GERD (gastroesophageal reflux disease)   . Heart murmur    as a child  . Impaired glucose tolerance 12/02/2012  . Impotence of organic origin   . Lumbar radiculopathy   . Malignant melanoma of skin of upper limb, including shoulder ([place])   . Old myocardial infarction   . Personal history of malignant melanoma of skin   . Personal history of other diseases of digestive system   . Thoracic or lumbosacral neuritis or radiculitis, unspecified   . Unspecified essential hypertension     Past Surgical History:  Procedure Laterality Date  . ARTHRODESIS  07/06/2002   of left long finger distal interphalangeal joint with Kirschner wire fixation X 3  . ATRIAL ABLATION SURGERY  05/04/2001   Dr. [name]  . CARDIAC CATHETERIZATION  09/05/2008   Revealing 4 of 4 patent grafts with native multivessel  coronary artery disease, EF  of 60% without regional wall motion abnormalities.  Marland Kitchen CATARACT EXTRACTION, BILATERAL    . CHOLECYSTECTOMY    . COLONOSCOPY    . CORONARY ANGIOPLASTY  1993  . CORONARY ARTERY BYPASS GRAFT  10/17/2000   [name]. [name], [place]     AFTER LIVER TRAUMA AND HAND SURGERY  . EYE SURGERY    . HERNIA REPAIR     BILATERAL  . LEFT HEART CATH AND CORS/GRAFTS ANGIOGRAPHY N/A 04/03/2017   Procedure: LEFT HEART CATH AND CORS/GRAFTS ANGIOGRAPHY;  Surgeon: Leonie Man, MD;  Location: [facility];  Service: Cardiovascular;  Laterality: N/A;  . LUMBAR LAMINECTOMY/DECOMPRESSION MICRODISCECTOMY N/A 02/02/2016   Procedure: MICRODISCECTOMY LUMBAR FOUR- LUMBAR FIVE;  Surgeon: Ashok Pall, MD;  Location: [facility];  Service: Neurosurgery;  Laterality: N/A;  MICRODISCECTOMY L4-L5  . LUMBAR LAMINECTOMY/DECOMPRESSION MICRODISCECTOMY Right 08/02/2016   Procedure: MICRODISCECTOMY LUMBAR FOUR- LUMBAR FIVE RIGHT;  Surgeon: Ashok Pall, MD;  Location: [facility];  Service: Neurosurgery;  Laterality: Right;  . punctured eardrum      to relieve blood build up   for ear infection  . TONSILLECTOMY      There were no vitals filed for this visit.  Subjective Assessment - 05/17/19 1452    Subjective  Up Gilford Rile has not come in yet; will  call him this week when it does.  Still having difficulty with the x1 viewing, still having diplopia with side to side head movements.  Still having a hard time keeping balance when walking.    Pertinent History  monocular vertical diplopia, HTN, CAD s/p MI, CABG, neurogenic orthostatic hypotension, neuropathy, cervicogenic HA    Limitations  Standing;Walking    How long can you stand comfortably?  10 minutes    Patient Stated Goals  maintain high QOL, get back to doing household chores without pain, get out and walk, to feel safe walking/with balance in order for him and his wife to get a dog    Currently in Pain?  Yes    Pain Score  4      Pain Location  Back    Pain Orientation  Lower    Pain Onset  More than a month ago         [facility] PT Assessment - 05/17/19 1501      Assessment   Medical Diagnosis  Lumbar spinal stenosis    Referring Provider (PT)  Antony Contras MD     Onset Date/Surgical Date  --   ~4 years ago     Precautions   Precautions  Fall      Prior Function   Level of Independence  Independent      Observation/Other Assessments   Focus on Therapeutic Outcomes (FOTO)   Not assessed      Standardized Balance Assessment   Standardized Balance Assessment  Berg Balance Test;Timed Up and Go Test;Five Times Sit to Stand    Five times sit to stand comments   15.06 with UE support on chair (improved from 17 seconds)      Berg Balance Test   Sit to Stand  Able to stand  independently using hands    Standing Unsupported  Able to stand safely 2 minutes    Sitting with Back Unsupported but Feet Supported on Floor or Stool  Able to sit safely and securely 2 minutes    Stand to Sit  Sits safely with minimal use of hands    Transfers  Able to transfer safely, minor use of hands    Standing Unsupported with Eyes Closed  Able to stand 10 seconds with supervision    Standing Unsupported with Feet Together  Able to place feet together independently and stand 1 minute safely    From Standing, Reach Forward with Outstretched Arm  Can reach forward >12 cm safely (5")    From Standing Position, Pick up Object from Floor  Able to pick up shoe safely and easily    From Standing Position, Turn to Look Behind Over each Shoulder  Looks behind one side only/other side shows less weight shift    Turn 360 Degrees  Able to turn 360 degrees safely one side only in 4 seconds or less    Standing Unsupported, Alternately Place Feet on Step/Stool  Able to complete 4 steps without aid or supervision    Standing Unsupported, One Foot in Front  Able to take small step independently and hold 30 seconds    Standing on One Leg  Tries to  lift leg/unable to hold 3 seconds but remains standing independently    Total Score  44    Berg comment:  44/56      Timed Up and Go Test   TUG  Normal TUG    Normal TUG (seconds)  11.56    TUG Comments  with  walking stick                    Vestibular Treatment/Exercise - 05/18/19 1619      Vestibular Treatment/Exercise   Vestibular Treatment Provided  Gaze    Gaze Exercises  X1 Viewing Horizontal      X1 Viewing Horizontal   Foot Position  seated    Reps  1    Comments  30 seconds; continues to report difficulty, diplopia and no significant benefit from performing - will D/C at this time      X1 Viewing Vertical   Comments  not performed due to diplopia            PT Education - 05/18/19 1616    Education Details  progress made; plan to continue to assess final two goals and plan to recertify for more visits for continued balance training and gait training with Up Walker.  D/C x1 viewing due to visual impairments    Person(s) Educated  Patient    Methods  Explanation    Comprehension  Verbalized understanding       PT Short Term Goals - 04/19/19 1326      PT SHORT TERM GOAL #1   Title  Pt is independent with initial HEP  (ALL STG due by 04/19/2019)    Baseline  met per report    Time  4    Period  Weeks    Status  Achieved    Target Date  04/19/19      PT SHORT TERM GOAL #2   Title  Pt will participate in further balance assessments with LTG to be reset: BERG and five time sit to stand    Baseline  BERG balance went from 37/56 to 38/56, did improve 5TSS however needed BUE support to do so.    Time  4    Period  Weeks    Status  Achieved    Target Date  04/19/19      PT SHORT TERM GOAL #3   Title  Patient will participate in full vestibular evaluation with LTG set if needed and will initiate vestibular HEP if needed    Baseline  partially met 04/19/2019    Time  4    Period  Weeks    Status  Partially Met    Target Date  04/19/19      PT  SHORT TERM GOAL #4   Title  Pt will report being able to stand for >10-15 minutes at home to perform housework such as washing dishes or do laundry with pain no >5/10    Baseline  met, made dinner the other night, 7/10 pain at most (but not until the very end of meal prep)    Time  4    Period  Weeks    Status  Achieved    Target Date  04/19/19      PT SHORT TERM GOAL #5   Title  Pt will report walking around neighborhood for up to 8-10 minutes with wife's supervision and LRAD with pain no >5/10    Baseline  Has not been able to ambulate for exercise in 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    Time  4    Period  Weeks    Status  Not Met    Target Date  04/19/19        PT Long Term Goals -  05/17/19 1455      PT LONG TERM GOAL #1   Title  Pt reports ability to ambulate >/= 30 minutes with LRAD without increase in pain. (all LTG target date: 05/19/19)    Baseline  Has been walking in the park 1/4 of a mile - walks with wife who walks slower - 15-18 minutes with walking stick - no increase in pain.    Time  8    Period  Weeks    Status  Partially Met      PT LONG TERM GOAL #2   Title  Pt demonstrates TUG </= 13 seconds to indicate lower fall risk.    Baseline  03/19/19: 17.33s >> 11 seconds    Time  8    Period  Weeks    Status  Achieved      PT LONG TERM GOAL #3   Title  Pt will report being able to stand >20 minutes at home to perform household chores with pain no >3/10    Baseline  20 minutes of standing for kitchen chores - needs to sit after performing chores.    Time  8    Period  Weeks    Status  Partially Met      PT LONG TERM GOAL #4   Title  Will improve 5xSTS to 13 seconds or less    Baseline  17  > 15 seconds    Time  8    Period  Weeks    Status  Partially Met      PT LONG TERM GOAL #5   Title  Will improve BERG to at least 46 to show improved balance.    Baseline  44/56    Time  8    Period  Weeks     Status  Partially Met      PT LONG TERM GOAL #6   Title  Pt will demonstrate 25% greater lumbar ROM into extension and side flexion    Time  8    Period  Weeks    Status  On-going      PT LONG TERM GOAL #7   Title  Pt will demonstrate improved use of VOR as indicated by his ability to perform X1 viewing in standing x 2 minutes with no UE support    Time  --    Period  --    Status  Deferred      PT LONG TERM GOAL #8   Title  He will improve [place] scale of kinesiophobia from 33 by 5 points to less than 28 to show less fear about pain and disability.    Status  New            Plan - 05/17/19 1535    Clinical Impression Statement  Continued to assess effectiveness of x1 viewing on symptoms of dizziness - pt continue to have increased difficulty performing without diplopia and with minimal benefit observed - D/C x1 viewing from HEP and LTG deferred.  Rest of session focused on initiation of assessment of progress towards LTG.  Will assess final 2 LTG at next session and plan to recertify for 4 more weeks to continue to address balance deficits and perform gait training with UpWalker once received.    Personal Factors and Comorbidities  Age;Comorbidity 3+;Behavior Pattern;Fitness;Past/Current Experience;Time since onset of injury/illness/exacerbation    Comorbidities  monocular vertical diplopia, HTN, CAD s/p MI, CABG, neurogenic orthostatic hypotension, neuropathy, cervicogenic HA, microdiscectomy (2017, 2018)  Examination-Activity Limitations  Locomotion Level;Stairs;Stand    Examination-Participation Restrictions  Cleaning;Community Activity;Laundry;Yard Work    [employer] officer  Evolving/Moderate complexity    Rehab Potential  Fair   pt stated not wanting to do exercises at home   PT Frequency  2x / week    PT Duration  8 weeks    PT Treatment/Interventions  ADLs/Self Care Home Management;Canalith Repostioning;Gait training;Stair training;Therapeutic  activities;Therapeutic exercise;Balance training;Patient/family education;Neuromuscular re-education;Manual techniques;Vestibular;Electrical Stimulation;Moist Heat;DME Instruction;Functional mobility training;Passive range of motion;Dry needling;Spinal Manipulations;Cryotherapy    PT Next Visit Plan  Check final 2 LTG - lumbar ROM and [place] scale of Kinesiophobia - see link below - print report (send to me for recert); revise and progress Medbridge HEP.  I D/C x1 viewing - no need to keep working on it due to visual impairments.  pt should have his Upwalker at next appt, and will bring it to make sure heigh is adjusted correctly. needs to work on loading/unloading it from car with/with out spouse assist, continue to work on outdoor uneven/compliant surfaces, curbs and ramps with [employer]. 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 and progress vestibular ex's as able.    PT Home Exercise Plan  Access Code: [email or web address]    Recommended Other Services  [email or web address]    Consulted and Agree with Plan of Care  Patient       Patient will benefit from skilled therapeutic intervention in order to improve the following deficits and impairments:  Abnormal gait, Decreased range of motion, Difficulty walking, Dizziness, Decreased activity tolerance, Decreased endurance, Pain, Decreased balance, Decreased strength, Decreased mobility, Impaired flexibility, Impaired sensation  Visit Diagnosis: Other abnormalities of gait and mobility  Dizziness and giddiness  Unsteadiness on feet  Muscle weakness (generalized)  Chronic midline low back pain without sciatica  Repeated falls     Problem List Patient Active Problem List   Diagnosis Date Noted  . Personal history of malignant melanoma of skin   . Lumbar radiculopathy   . Heart murmur   . GERD (gastroesophageal reflux disease)   . Diminished hearing, bilateral   . Cataracts, bilateral   . Bladder neck obstruction   .  Arthritis   . Constipation 12/12/2016  . Nausea 12/12/2016  . Coronary artery disease involving native coronary artery of native heart without angina pectoris 11/22/2016  . Dyslipidemia 11/22/2016  . History of coronary artery bypass surgery 11/22/2016  . Depression with anxiety   . Orthostasis 09/20/2016  . HNP (herniated nucleus pulposus), lumbar 02/02/2016  . Spinal stenosis of lumbar region 01/24/2016  . Sciatica, right side 01/24/2016  . Prediabetes 01/24/2016  . Otitis, externa, infective 01/24/2016  . Left lumbar radiculopathy 01/15/2016  . PVC's (premature ventricular contractions) 05/24/2015  . Peripheral polyneuropathy 03/06/2015  . BPPV (benign paroxysmal positional vertigo) 03/06/2015  . Headache 01/18/2015  . Vertigo 01/18/2015  . Allergic rhinitis 09/20/2014  . Dyspnea on exertion 08/10/2014  . Severe dizziness 08/10/2014  . Acute diverticulitis 05/18/2014  . Diarrhea 05/18/2014  . LLQ pain 05/13/2014  . Chronic meniscal tear of knee 09/22/2013  . Primary localized osteoarthrosis, lower leg 09/22/2013  . Neck pain, bilateral 12/18/2012  . Headache(784.0) 12/18/2012  . Binocular visual disturbance 12/18/2012  . Epiretinal membrane 12/17/2012  . Status post intraocular lens implant 12/17/2012  . Preventative health care 12/02/2012  . Low back pain 12/02/2012  . Impaired glucose tolerance 12/02/2012  . Palpitations 10/09/2010  . B12 deficiency 08/30/2010  . Hearing loss 08/30/2010  . Tinnitus 08/30/2010  . Neurogenic  orthostatic hypotension ([place]) 06/28/2009  . HYPERLIPIDEMIA TYPE IIB / III 05/15/2009  . CHEST PAIN UNSPECIFIED 11/23/2008  . Hypotension, unspecified 10/12/2008  . DIZZINESS 10/12/2008  . Shortness of breath 09/14/2008  . UNSPECIFIED ANEMIA 09/13/2008  . ANXIETY DEPRESSION 09/13/2008  . Old myocardial infarction 09/13/2008  . GASTROESOPHAGEAL REFLUX DISEASE, HX OF 09/13/2008  . LUMBAR RADICULOPATHY, LEFT 08/23/2008  . MELANOMA, SHOULDER  11/27/2006  . Essential hypertension 11/27/2006  . Coronary atherosclerosis 11/27/2006  . BPH associated with nocturia 11/27/2006  . ERECTILE DYSFUNCTION, ORGANIC 11/27/2006    Rico Junker, PT, DPT 05/18/19    4:30 PM    [facility] [address] [place], [place], [address] Phone: [phone]   Fax:  [phone]  Name: Troy Combs MRN: [id number] Date of Birth: 1931-09-24

## 2019-05-20 ENCOUNTER — Other Ambulatory Visit: Payer: Self-pay

## 2019-05-20 ENCOUNTER — Encounter: Payer: Self-pay | Admitting: Physical Therapy

## 2019-05-20 ENCOUNTER — Ambulatory Visit: Payer: Medicare Other | Admitting: Physical Therapy

## 2019-05-20 DIAGNOSIS — R2689 Other abnormalities of gait and mobility: Secondary | ICD-10-CM | POA: Diagnosis not present

## 2019-05-20 DIAGNOSIS — G8929 Other chronic pain: Secondary | ICD-10-CM

## 2019-05-20 DIAGNOSIS — M545 Low back pain, unspecified: Secondary | ICD-10-CM

## 2019-05-20 DIAGNOSIS — M6281 Muscle weakness (generalized): Secondary | ICD-10-CM

## 2019-05-20 DIAGNOSIS — R42 Dizziness and giddiness: Secondary | ICD-10-CM

## 2019-05-20 DIAGNOSIS — R296 Repeated falls: Secondary | ICD-10-CM

## 2019-05-20 DIAGNOSIS — R2681 Unsteadiness on feet: Secondary | ICD-10-CM

## 2019-05-21 NOTE — Therapy (Addendum)
[facility] [address] [place], [place], [address] Phone: [phone]   Fax:  [phone]  Physical Therapy Treatment  Patient Details  Name: Troy Combs MRN: [id number] Date of Birth: 02-Apr-1932 Referring Provider (PT): Antony Contras MD    Encounter Date: 05/20/2019     05/20/19 1237  PT Visits / Re-Eval  Visit Number 14  Number of Visits 22  Date for PT Re-Evaluation 06/19/19  Authorization  Authorization Type [employer] - 10th visit PN  PT Time Calculation  PT Start Time 1232  PT Stop Time 1315  PT Time Calculation (min) 43 min  PT - End of Session  Equipment Utilized During Treatment Gait belt  Activity Tolerance Patient tolerated treatment well;Patient limited by fatigue  Behavior During Therapy [facility] for tasks assessed/performed    Past Medical History:  Diagnosis Date  . Allergic rhinitis 09/20/2014  . Anemia, unspecified   . Arthritis   . Bladder neck obstruction   . Cataracts, bilateral   . Coronary atherosclerosis of unspecified type of vessel, native or graft   . Diminished hearing, bilateral   . Dysthymic disorder   . GERD (gastroesophageal reflux disease)   . Heart murmur    as a child  . Impaired glucose tolerance 12/02/2012  . Impotence of organic origin   . Lumbar radiculopathy   . Malignant melanoma of skin of upper limb, including shoulder ([place])   . Old myocardial infarction   . Personal history of malignant melanoma of skin   . Personal history of other diseases of digestive system   . Thoracic or lumbosacral neuritis or radiculitis, unspecified   . Unspecified essential hypertension     Past Surgical History:  Procedure Laterality Date  . ARTHRODESIS  07/06/2002   of left long finger distal interphalangeal joint with Kirschner wire fixation X 3  . ATRIAL ABLATION SURGERY  05/04/2001   Dr. [name]  . CARDIAC CATHETERIZATION  09/05/2008   Revealing 4 of 4 patent  grafts with native multivessel coronary artery disease, EF  of 60% without regional wall motion abnormalities.  Marland Kitchen CATARACT EXTRACTION, BILATERAL    . CHOLECYSTECTOMY    . COLONOSCOPY    . CORONARY ANGIOPLASTY  1993  . CORONARY ARTERY BYPASS GRAFT  10/17/2000   [name]. [name], [place]     AFTER LIVER TRAUMA AND HAND SURGERY  . EYE SURGERY    . HERNIA REPAIR     BILATERAL  . LEFT HEART CATH AND CORS/GRAFTS ANGIOGRAPHY N/A 04/03/2017   Procedure: LEFT HEART CATH AND CORS/GRAFTS ANGIOGRAPHY;  Surgeon: Leonie Man, MD;  Location: [facility];  Service: Cardiovascular;  Laterality: N/A;  . LUMBAR LAMINECTOMY/DECOMPRESSION MICRODISCECTOMY N/A 02/02/2016   Procedure: MICRODISCECTOMY LUMBAR FOUR- LUMBAR FIVE;  Surgeon: Ashok Pall, MD;  Location: [facility];  Service: Neurosurgery;  Laterality: N/A;  MICRODISCECTOMY L4-L5  . LUMBAR LAMINECTOMY/DECOMPRESSION MICRODISCECTOMY Right 08/02/2016   Procedure: MICRODISCECTOMY LUMBAR FOUR- LUMBAR FIVE RIGHT;  Surgeon: Ashok Pall, MD;  Location: [facility];  Service: Neurosurgery;  Laterality: Right;  . punctured eardrum      to relieve blood build up   for ear infection  . TONSILLECTOMY      There were no vitals filed for this visit.  Subjective Assessment - 05/20/19 1234    Subjective  No falls. Upwalker got "lost" in shipment from [place] to here. Company is working on tracking it down. Had some low back pain this am, 5/10.  Took some Advil and is better now 3/10. Continues with dizziness, 6/10. Not doing the eye exercises has helped as well.    Pertinent History  monocular vertical diplopia, HTN, CAD s/p MI, CABG, neurogenic orthostatic hypotension, neuropathy, cervicogenic HA    Limitations  Standing;Walking    How long can you stand comfortably?  10 minutes    Patient Stated Goals  maintain high QOL, get back to doing household chores without pain, get out and walk, to feel safe walking/with balance in order for him and  his wife to get a dog    Currently in Pain?  Yes    Pain Score  3     Pain Location  Back    Pain Orientation  Lower    Pain Descriptors / Indicators  Sharp    Pain Type  Chronic pain    Pain Onset  More than a month ago    Pain Frequency  Constant    Aggravating Factors   bending over when standing    Pain Relieving Factors  stopping the activity that is bothering him, heating pads, pain meds in the evening         [facility] PT Assessment - 05/20/19 1257      AROM   AROM Assessment Site  Lumbar    Lumbar Flexion  30   measured with inclinometers   Lumbar Extension  10   with knee flexion, measured with inclinometers   Lumbar - Right Side Bend  10    Lumbar - Left Side Bend  10           [facility] Adult PT Treatment/Exercise - 05/20/19 1257      Transfers   Transfers  Sit to Stand;Stand to Sit    Sit to Stand  6: Modified independent (Device/Increase time);With upper extremity assist;From bed    Stand to Sit  6: Modified independent (Device/Increase time);With upper extremity assist;To bed      Ambulation/Gait   Ambulation/Gait  Yes    Ambulation/Gait Assistance  6: Modified independent (Device/Increase time)    Ambulation/Gait Assistance Details  worked on going around obstacles, turns both ways and increased step length with gait.     Ambulation Distance (Feet)  450 Feet   x1   Assistive device  Other (Comment)   Upwalker   Gait Pattern  Step-through pattern;Decreased arm swing - right;Decreased arm swing - left;Decreased stride length;Decreased step length - left;Decreased step length - right;Decreased hip/knee flexion - right;Decreased hip/knee flexion - left;Decreased trunk rotation;Trunk flexed;Wide base of support    Ambulation Surface  Level;Indoor      Self-Care   Self-Care  Other Self-Care Comments    Other Self-Care Comments   Administered the [place] Scale with pt scoring 40 today (was 33 at last assessment).        Exercises   Exercises  Other Exercises     Other Exercises   stretches as follows: seated hamstring at edge of chair for 30 sec holds x 3 each leg with cues on form/hold times, standing runners stretch for 30 sec hold x 3 each leg with cues on form/technique, seated fwd slup/fold, then back up into extension with arms out for scaplar squeeze x 10 reps, followed by alternating overhead UE reach  with lateral lean for side stretch x 10 each side.            PT Short Term Goals - 04/19/19 1326      PT SHORT TERM GOAL #1  Title  Pt is independent with initial HEP  (ALL STG due by 04/19/2019)    Baseline  met per report    Time  4    Period  Weeks    Status  Achieved    Target Date  04/19/19      PT SHORT TERM GOAL #2   Title  Pt will participate in further balance assessments with LTG to be reset: BERG and five time sit to stand    Baseline  BERG balance went from 37/56 to 38/56, did improve 5TSS however needed BUE support to do so.    Time  4    Period  Weeks    Status  Achieved    Target Date  04/19/19      PT SHORT TERM GOAL #3   Title  Patient will participate in full vestibular evaluation with LTG set if needed and will initiate vestibular HEP if needed    Baseline  partially met 04/19/2019    Time  4    Period  Weeks    Status  Partially Met    Target Date  04/19/19      PT SHORT TERM GOAL #4   Title  Pt will report being able to stand for >10-15 minutes at home to perform housework such as washing dishes or do laundry with pain no >5/10    Baseline  met, made dinner the other night, 7/10 pain at most (but not until the very end of meal prep)    Time  4    Period  Weeks    Status  Achieved    Target Date  04/19/19      PT SHORT TERM GOAL #5   Title  Pt will report walking around neighborhood for up to 8-10 minutes with wife's supervision and LRAD with pain no >5/10    Baseline  Has not been able to ambulate for exercise in last couple of weeks due to construction but feels as though he wouldn't be able to do  this.    Time  4    Period  Weeks    Status  Not Met    Target Date  04/19/19        PT Long Term Goals - 05/20/19 1948      PT LONG TERM GOAL #1   Title  Pt reports ability to ambulate >/= 30 minutes with LRAD without increase in pain. (all LTG target date: 05/19/19)    Baseline  05/17/19: Has been walking in the park 1/4 of a mile - walks with wife who walks slower - 15-18 minutes with walking stick - no increase in pain.    Status  Partially Met      PT LONG TERM GOAL #2   Title  Pt demonstrates TUG </= 13 seconds to indicate lower fall risk.    Baseline  03/19/19: 17.33s >> 11 seconds    Status  Achieved      PT LONG TERM GOAL #3   Title  Pt will report being able to stand >20 minutes at home to perform household chores with pain no >3/10    Baseline  05/17/19; 20 minutes of standing for kitchen chores - needs to sit after performing chores.    Status  Partially Met      PT LONG TERM GOAL #4   Title  Will improve 5xSTS to 13 seconds or less    Baseline  05/17/19: 17  > 15 seconds  Status  Partially Met      PT LONG TERM GOAL #5   Title  Will improve BERG to at least 46 to show improved balance.    Baseline  05/17/19: 44/56    Status  Partially Met      PT LONG TERM GOAL #6   Title  Pt will demonstrate 25% greater lumbar ROM into extension and side flexion   continues to be limited, has improved since eval   Status  Partially Met      PT LONG TERM GOAL #7   Title  Pt will demonstrate improved use of VOR as indicated by his ability to perform X1 viewing in standing x 2 minutes with no UE support    Status  Deferred      PT LONG TERM GOAL #8   Title  He will improve [place] scale of kinesiophobia from 33 by 5 points to less than 28 to show less fear about pain and disability.   score decreased to 40 on reassessment on 05/20/19   Status  Not Met        New goals for recertification: PT Short Term Goals - 05/25/19 1439      PT SHORT TERM GOAL #1   Title  = LTG       PT Long Term Goals - 05/25/19 1439      PT LONG TERM GOAL #1   Title  Pt reports ability to ambulate >/= 30 minutes with LRAD without increase in pain. (all LTG target date: 06/19/2019)    Baseline  05/17/19: Has been walking in the park 1/4 of a mile - walks with wife who walks slower - 15-18 minutes with walking stick - no increase in pain.    Time  4    Period  Weeks    Status  Revised    Target Date  06/19/19      PT LONG TERM GOAL #2   Title  He will improve Tampa scale of kinesiophobia to less than 28 to show less fear about pain and disability.    Baseline  increased to 40 at re-certification despite pt reporting less pain and greater function    Time  4    Period  Weeks    Status  Revised    Target Date  06/19/19      PT LONG TERM GOAL #3   Title  Pt will report being able to stand >20 minutes at home to perform household chores with pain no >3/10    Baseline  05/17/19; 20 minutes of standing for kitchen chores - needs to sit after performing chores due to pain >3/10    Time  4    Period  Weeks    Status  Revised    Target Date  06/19/19      PT LONG TERM GOAL #4   Title  Will improve 5xSTS to 13 seconds or less    Baseline  05/17/19: 17  > 15 seconds    Time  4    Period  Weeks    Status  Revised    Target Date  06/19/19      PT LONG TERM GOAL #5   Title  Will improve BERG to at least 46 to show improved balance.    Baseline  05/17/19: 44/56    Time  4    Period  Weeks    Status  Revised    Target Date  06/19/19  PT LONG TERM GOAL #6   Title  Pt will demonstrate 25% greater lumbar ROM into extension and side flexion   continues to be limited, has improved since eval   Time  4    Period  Weeks    Status  Revised    Target Date  06/19/19      PT LONG TERM GOAL #8   Title  --   score decreased to 40 on reassessment on 05/20/19         05/20/19 1237  Plan  Clinical Impression Statement Today's skilled session inially focused on remaining LTGs wtih pt  scoring 40 on the [place] Scale, increased from last assessment. Pt did demo improved lumbar range of motion with more formal measurments obained today with dual inclinometry, meeting that goal. Remainder of session continued to address use of Upwalker with gait. The pt shoud benefit from continued PT to progress toward unmet goals.  Addendum added by primary PT: Pt is making steady progress towards goals - during this cert period he has met 1/8 LTG, partially met 5 goals, one goal deferred due to visual impairments and did not meet goal #8.  It is unclear why his Tampa Scale of Kinesiophobia score has increased (greater fear of movement) as pt does report decreased pain and increased activity overall compared with initial evaluation.  He also demonstrates improvements in balance, endurance and activity tolerance.  Pt continues to present with vestibular impairments but unable to treat with VOR training due to premorbid diplopia and eye lenses that does not allow the image to remain focused on retina - pt has difficulty performing monocular.  Pt has received his UpWalker but has not been able to bring it to therapy for training.  Pt will benefit from continued skilled PT services to continue to address these impairments and gait training with new walker to maximize functional mobility independence and decrease falls risk.  Personal Factors and Comorbidities Age;Comorbidity 3+;Behavior Pattern;Fitness;Past/Current Experience;Time since onset of injury/illness/exacerbation  Comorbidities monocular vertical diplopia, HTN, CAD s/p MI, CABG, neurogenic orthostatic hypotension, neuropathy, cervicogenic HA, microdiscectomy (2017, 2018)  Examination-Activity Limitations Locomotion Level;Stairs;Stand  Examination-Participation Restrictions Cleaning;Community Activity;Laundry;Yard Work  Pt will benefit from skilled therapeutic intervention in order to improve on the following deficits Abnormal gait;Decreased range of  motion;Difficulty walking;Dizziness;Decreased activity tolerance;Decreased endurance;Pain;Decreased balance;Decreased strength;Decreased mobility;Impaired flexibility;Impaired sensation  Rehab Potential  (pt stated not wanting to do exercises at home)  PT Frequency 2x / week  PT Duration 4 weeks  PT Treatment/Interventions ADLs/Self Care Home Management;Canalith Repostioning;Gait training;Stair training;Therapeutic activities;Therapeutic exercise;Balance training;Patient/family education;Neuromuscular re-education;Manual techniques;Vestibular;Electrical Stimulation;Moist Heat;DME Instruction;Functional mobility training;Passive range of motion;Dry needling;Spinal Manipulations;Cryotherapy  PT Next Visit Plan revise and progress [employer] HEP.  pt should have his Upwalker at next appt, and will bring it to make sure heigh is adjusted correctly. needs to work on loading/unloading it from car with/with out spouse assist, continue to work on outdoor uneven/compliant surfaces, curbs and ramps with [employer]. 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 and progress vestibular ex's as able.  PT Home Exercise Plan Access Code: [email or web address]  [email or web address] and Agree with Plan of Care Patient        Patient will benefit from skilled therapeutic intervention in order to improve the following deficits and impairments:  Abnormal gait, Decreased range of motion, Difficulty walking, Dizziness, Decreased activity tolerance, Decreased endurance, Pain, Decreased balance, Decreased strength, Decreased mobility, Impaired flexibility, Impaired sensation  Visit Diagnosis: Other abnormalities of gait and mobility  Dizziness and giddiness  Unsteadiness on feet  Muscle weakness (generalized)  Chronic midline low back pain without sciatica  Repeated falls     Problem List Patient Active Problem List   Diagnosis Date Noted  . Personal history of malignant melanoma of skin   . Lumbar radiculopathy   . Heart  murmur   . GERD (gastroesophageal reflux disease)   . Diminished hearing, bilateral   . Cataracts, bilateral   . Bladder neck obstruction   . Arthritis   . Constipation 12/12/2016  . Nausea 12/12/2016  . Coronary artery disease involving native coronary artery of native heart without angina pectoris 11/22/2016  . Dyslipidemia 11/22/2016  . History of coronary artery bypass surgery 11/22/2016  . Depression with anxiety   . Orthostasis 09/20/2016  . HNP (herniated nucleus pulposus), lumbar 02/02/2016  . Spinal stenosis of lumbar region 01/24/2016  . Sciatica, right side 01/24/2016  . Prediabetes 01/24/2016  . Otitis, externa, infective 01/24/2016  . Left lumbar radiculopathy 01/15/2016  . PVC's (premature ventricular contractions) 05/24/2015  . Peripheral polyneuropathy 03/06/2015  . BPPV (benign paroxysmal positional vertigo) 03/06/2015  . Headache 01/18/2015  . Vertigo 01/18/2015  . Allergic rhinitis 09/20/2014  . Dyspnea on exertion 08/10/2014  . Severe dizziness 08/10/2014  . Acute diverticulitis 05/18/2014  . Diarrhea 05/18/2014  . LLQ pain 05/13/2014  . Chronic meniscal tear of knee 09/22/2013  . Primary localized osteoarthrosis, lower leg 09/22/2013  . Neck pain, bilateral 12/18/2012  . Headache(784.0) 12/18/2012  . Binocular visual disturbance 12/18/2012  . Epiretinal membrane 12/17/2012  . Status post intraocular lens implant 12/17/2012  . Preventative health care 12/02/2012  . Low back pain 12/02/2012  . Impaired glucose tolerance 12/02/2012  . Palpitations 10/09/2010  . B12 deficiency 08/30/2010  . Hearing loss 08/30/2010  . Tinnitus 08/30/2010  . Neurogenic orthostatic hypotension ([place]) 06/28/2009  . HYPERLIPIDEMIA TYPE IIB / III 05/15/2009  . CHEST PAIN UNSPECIFIED 11/23/2008  . Hypotension, unspecified 10/12/2008  . DIZZINESS 10/12/2008  . Shortness of breath 09/14/2008  . UNSPECIFIED ANEMIA 09/13/2008  . ANXIETY DEPRESSION 09/13/2008  . Old myocardial  infarction 09/13/2008  . GASTROESOPHAGEAL REFLUX DISEASE, HX OF 09/13/2008  . LUMBAR RADICULOPATHY, LEFT 08/23/2008  . MELANOMA, SHOULDER 11/27/2006  . Essential hypertension 11/27/2006  . Coronary atherosclerosis 11/27/2006  . BPH associated with nocturia 11/27/2006  . ERECTILE DYSFUNCTION, ORGANIC 11/27/2006   Willow Ora, PTA, [facility] [address] [place], [place] [address] [phone] 05/21/19, 7:55 PM    Addendum for End of session, Plan, Goals all added by primary PT: Rico Junker, PT, DPT 05/25/19    2:45 PM    Name: Troy Combs MRN: [id number] Date of Birth: 04-23-1932

## 2019-05-24 ENCOUNTER — Other Ambulatory Visit: Payer: Self-pay

## 2019-05-24 ENCOUNTER — Encounter: Payer: Self-pay | Admitting: Physical Therapy

## 2019-05-24 ENCOUNTER — Ambulatory Visit: Payer: Medicare Other | Admitting: Physical Therapy

## 2019-05-24 DIAGNOSIS — R296 Repeated falls: Secondary | ICD-10-CM

## 2019-05-24 DIAGNOSIS — M545 Low back pain, unspecified: Secondary | ICD-10-CM

## 2019-05-24 DIAGNOSIS — R2689 Other abnormalities of gait and mobility: Secondary | ICD-10-CM | POA: Diagnosis not present

## 2019-05-24 DIAGNOSIS — R2681 Unsteadiness on feet: Secondary | ICD-10-CM

## 2019-05-24 DIAGNOSIS — G8929 Other chronic pain: Secondary | ICD-10-CM

## 2019-05-24 DIAGNOSIS — R42 Dizziness and giddiness: Secondary | ICD-10-CM

## 2019-05-24 DIAGNOSIS — M6281 Muscle weakness (generalized): Secondary | ICD-10-CM

## 2019-05-24 NOTE — Patient Instructions (Addendum)
      Weight Shift: Anterior / Posterior (Righting / Equilibrium)    Start with back against a wall, walk out feet in front of you.  Bow from the hips lean forward and pull hips away from wall.  Pause and keep your balance.  Send hips back to wall with control and then take your shoulders back to the wall.  Perform 10 times.  Keep your knees soft.    Access Code: [email or web address]  URL: [email or web address]  Date: 05/24/2019  Prepared by: Misty Stanley   Exercises Supine Lower Trunk Rotation - 5 reps - 1 sets - 10 sec hold - 2x daily - 6x weekly Hooklying Single Knee to Chest Stretch - 2 reps - 30 hold - 2x daily - 6x weekly Staggered stance with Eyes Open - 2 sets - 10 seconds hold - 10 reps - 2x daily - 6x weekly Standing Balance in Corner with Eyes Closed, Head turns and nods - 10 reps - 2 sets - 2x daily - 6x weekly Quarter Squat with Dumbbells - 10 reps - 2 sets - 1x daily - 7x weekly

## 2019-05-25 NOTE — Therapy (Signed)
[facility] [address] [place], [place], [address] Phone: [phone]   Fax:  [phone]  Physical Therapy Treatment  Patient Details  Name: Troy Combs MRN: [id number] Date of Birth: 12/28/1931 Referring Provider (PT): Antony Contras MD    Encounter Date: 05/24/2019  PT End of Session - 05/25/19 1448    Visit Number  15    Number of Visits  22    Date for PT Re-Evaluation  06/19/19    Authorization Type  [employer] Medicare - 10th visit PN    PT Start Time  [id number]    PT Stop Time  1533    PT Time Calculation (min)  44 min    Activity Tolerance  Patient tolerated treatment well    Behavior During Therapy  [facility] for tasks assessed/performed       Past Medical History:  Diagnosis Date  . Allergic rhinitis 09/20/2014  . Anemia, unspecified   . Arthritis   . Bladder neck obstruction   . Cataracts, bilateral   . Coronary atherosclerosis of unspecified type of vessel, native or graft   . Diminished hearing, bilateral   . Dysthymic disorder   . GERD (gastroesophageal reflux disease)   . Heart murmur    as a child  . Impaired glucose tolerance 12/02/2012  . Impotence of organic origin   . Lumbar radiculopathy   . Malignant melanoma of skin of upper limb, including shoulder ([place])   . Old myocardial infarction   . Personal history of malignant melanoma of skin   . Personal history of other diseases of digestive system   . Thoracic or lumbosacral neuritis or radiculitis, unspecified   . Unspecified essential hypertension     Past Surgical History:  Procedure Laterality Date  . ARTHRODESIS  07/06/2002   of left long finger distal interphalangeal joint with Kirschner wire fixation X 3  . ATRIAL ABLATION SURGERY  05/04/2001   Dr. [name]  . CARDIAC CATHETERIZATION  09/05/2008   Revealing 4 of 4 patent grafts with native multivessel coronary artery disease, EF  of 60% without regional wall motion abnormalities.  Marland Kitchen  CATARACT EXTRACTION, BILATERAL    . CHOLECYSTECTOMY    . COLONOSCOPY    . CORONARY ANGIOPLASTY  1993  . CORONARY ARTERY BYPASS GRAFT  10/17/2000   [name]. [name], [place]     AFTER LIVER TRAUMA AND HAND SURGERY  . EYE SURGERY    . HERNIA REPAIR     BILATERAL  . LEFT HEART CATH AND CORS/GRAFTS ANGIOGRAPHY N/A 04/03/2017   Procedure: LEFT HEART CATH AND CORS/GRAFTS ANGIOGRAPHY;  Surgeon: Leonie Man, MD;  Location: [facility];  Service: Cardiovascular;  Laterality: N/A;  . LUMBAR LAMINECTOMY/DECOMPRESSION MICRODISCECTOMY N/A 02/02/2016   Procedure: MICRODISCECTOMY LUMBAR FOUR- LUMBAR FIVE;  Surgeon: Ashok Pall, MD;  Location: [facility];  Service: Neurosurgery;  Laterality: N/A;  MICRODISCECTOMY L4-L5  . LUMBAR LAMINECTOMY/DECOMPRESSION MICRODISCECTOMY Right 08/02/2016   Procedure: MICRODISCECTOMY LUMBAR FOUR- LUMBAR FIVE RIGHT;  Surgeon: Ashok Pall, MD;  Location: [place] [facility];  Service: Neurosurgery;  Laterality: Right;  . punctured eardrum      to relieve blood build up   for ear infection  . TONSILLECTOMY      There were no vitals filed for this visit.  Subjective Assessment - 05/24/19 1454    Subjective  Was able to pick up the walker yesterday.  Didn't bring it in today due to the rain.  Woke up  with significant low back pain but was able to relieve it with Ibuprofen.  Dizziness is not changing.    Pertinent History  monocular vertical diplopia, HTN, CAD s/p MI, CABG, neurogenic orthostatic hypotension, neuropathy, cervicogenic HA    Limitations  Standing;Walking    How long can you stand comfortably?  10 minutes    Patient Stated Goals  maintain high QOL, get back to doing household chores without pain, get out and walk, to feel safe walking/with balance in order for him and his wife to get a dog    Currently in Pain?  Yes    Pain Onset  More than a month ago       Access Code: [email or web address]  URL: [email or web address]  Date: 05/24/2019   Prepared by: Misty Stanley   Exercises Supine Lower Trunk Rotation - 5 reps - 1 sets - 10 sec hold - 2x daily - 6x weekly Hooklying Single Knee to Chest Stretch - 2 reps - 30 hold - 2x daily - 6x weekly Staggered stance with Eyes Open - 2 sets - 10 seconds hold - 10 reps - 2x daily - 6x weekly Standing Balance in Corner with Eyes Closed, Head turns and nods - 10 reps - 2 sets - 2x daily - 6x weekly Quarter Squat with Dumbbells - 10 reps - 2 sets - 1x daily - 7x        PT Education - 05/25/19 1447    Education Details  updated HEP, recertification, will start training with UpWalker at next visit    Person(s) Educated  Patient    Methods  Explanation;Demonstration;Handout    Comprehension  Verbalized understanding;Returned demonstration       PT Short Term Goals - 05/25/19 1439      PT SHORT TERM GOAL #1   Title  = LTG        PT Long Term Goals - 05/25/19 1439      PT LONG TERM GOAL #1   Title  Pt reports ability to ambulate >/= 30 minutes with LRAD without increase in pain. (all LTG target date: 06/19/2019)    Baseline  05/17/19: Has been walking in the park 1/4 of a mile - walks with wife who walks slower - 15-18 minutes with walking stick - no increase in pain.    Time  4    Period  Weeks    Status  Revised    Target Date  06/19/19      PT LONG TERM GOAL #2   Title  He will improve [place] scale of kinesiophobia to less than 28 to show less fear about pain and disability.    Baseline  increased to 40 at re-certification despite pt reporting less pain and greater function    Time  4    Period  Weeks    Status  Revised    Target Date  06/19/19      PT LONG TERM GOAL #3   Title  Pt will report being able to stand >20 minutes at home to perform household chores with pain no >3/10    Baseline  05/17/19; 20 minutes of standing for kitchen chores - needs to sit after performing chores due to pain >3/10    Time  4    Period  Weeks    Status  Revised    Target Date   06/19/19      PT LONG TERM GOAL #4   Title  Will improve 5xSTS to 13 seconds or less  Baseline  05/17/19: 17  > 15 seconds    Time  4    Period  Weeks    Status  Revised    Target Date  06/19/19      PT LONG TERM GOAL #5   Title  Will improve BERG to at least 46 to show improved balance.    Baseline  05/17/19: 44/56    Time  4    Period  Weeks    Status  Revised    Target Date  06/19/19      PT LONG TERM GOAL #6   Title  Pt will demonstrate 25% greater lumbar ROM into extension and side flexion   continues to be limited, has improved since eval   Time  4    Period  Weeks    Status  Revised    Target Date  06/19/19      PT LONG TERM GOAL #8   Title  --   score decreased to 40 on reassessment on 05/20/19           Plan - 05/25/19 1449    Clinical Impression Statement  Pt unable to bring in UpWalker today due to weather.  Due to progress made with balance, treatment session focused on updating and progressing HEP to include more LE strengthening and balance training.  Pt tolerated well with no increase in dizziness or back pain.  Will continue to progress towards LTG.    Personal Factors and Comorbidities  Age;Comorbidity 3+;Behavior Pattern;Fitness;Past/Current Experience;Time since onset of injury/illness/exacerbation    Comorbidities  monocular vertical diplopia, HTN, CAD s/p MI, CABG, neurogenic orthostatic hypotension, neuropathy, cervicogenic HA, microdiscectomy (2017, 2018)    Examination-Activity Limitations  Locomotion Level;Stairs;Stand    Examination-Participation Restrictions  Cleaning;Community Activity;Laundry;Yard Work    [employer]  --   pt stated not wanting to do exercises at home   PT Frequency  2x / week    PT Duration  4 weeks    PT Treatment/Interventions  ADLs/Self Care Home Management;Canalith Repostioning;Gait training;Stair training;Therapeutic activities;Therapeutic exercise;Balance training;Patient/family education;Neuromuscular  re-education;Manual techniques;Vestibular;Electrical Stimulation;Moist Heat;DME Instruction;Functional mobility training;Passive range of motion;Dry needling;Spinal Manipulations;Cryotherapy    PT Next Visit Plan  pt should have his Upwalker at next appt, and will bring it to make sure heigh is adjusted correctly. needs to work on loading/unloading it from car with/with out spouse assist, continue to work on outdoor uneven/compliant surfaces, curbs and ramps with [employer]. 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    PT Home Exercise Plan  Access Code: [email or web address] and Agree with Plan of Care  Patient       Patient will benefit from skilled therapeutic intervention in order to improve the following deficits and impairments:  Abnormal gait, Decreased range of motion, Difficulty walking, Dizziness, Decreased activity tolerance, Decreased endurance, Pain, Decreased balance, Decreased strength, Decreased mobility, Impaired flexibility, Impaired sensation  Visit Diagnosis: Other abnormalities of gait and mobility  Unsteadiness on feet  Muscle weakness (generalized)  Chronic midline low back pain without sciatica  Repeated falls  Dizziness and giddiness     Problem List Patient Active Problem List   Diagnosis Date Noted  . Personal history of malignant melanoma of skin   . Lumbar radiculopathy   . Heart murmur   . GERD (gastroesophageal reflux disease)   . Diminished hearing, bilateral   . Cataracts, bilateral   . Bladder neck obstruction   . Arthritis   . Constipation 12/12/2016  . Nausea 12/12/2016  .  Coronary artery disease involving native coronary artery of native heart without angina pectoris 11/22/2016  . Dyslipidemia 11/22/2016  . History of coronary artery bypass surgery 11/22/2016  . Depression with anxiety   . Orthostasis 09/20/2016  . HNP (herniated nucleus pulposus), lumbar 02/02/2016  . Spinal stenosis of lumbar region 01/24/2016  . Sciatica,  right side 01/24/2016  . Prediabetes 01/24/2016  . Otitis, externa, infective 01/24/2016  . Left lumbar radiculopathy 01/15/2016  . PVC's (premature ventricular contractions) 05/24/2015  . Peripheral polyneuropathy 03/06/2015  . BPPV (benign paroxysmal positional vertigo) 03/06/2015  . Headache 01/18/2015  . Vertigo 01/18/2015  . Allergic rhinitis 09/20/2014  . Dyspnea on exertion 08/10/2014  . Severe dizziness 08/10/2014  . Acute diverticulitis 05/18/2014  . Diarrhea 05/18/2014  . LLQ pain 05/13/2014  . Chronic meniscal tear of knee 09/22/2013  . Primary localized osteoarthrosis, lower leg 09/22/2013  . Neck pain, bilateral 12/18/2012  . Headache(784.0) 12/18/2012  . Binocular visual disturbance 12/18/2012  . Epiretinal membrane 12/17/2012  . Status post intraocular lens implant 12/17/2012  . Preventative health care 12/02/2012  . Low back pain 12/02/2012  . Impaired glucose tolerance 12/02/2012  . Palpitations 10/09/2010  . B12 deficiency 08/30/2010  . Hearing loss 08/30/2010  . Tinnitus 08/30/2010  . Neurogenic orthostatic hypotension ([place]) 06/28/2009  . HYPERLIPIDEMIA TYPE IIB / III 05/15/2009  . CHEST PAIN UNSPECIFIED 11/23/2008  . Hypotension, unspecified 10/12/2008  . DIZZINESS 10/12/2008  . Shortness of breath 09/14/2008  . UNSPECIFIED ANEMIA 09/13/2008  . ANXIETY DEPRESSION 09/13/2008  . Old myocardial infarction 09/13/2008  . GASTROESOPHAGEAL REFLUX DISEASE, HX OF 09/13/2008  . LUMBAR RADICULOPATHY, LEFT 08/23/2008  . MELANOMA, SHOULDER 11/27/2006  . Essential hypertension 11/27/2006  . Coronary atherosclerosis 11/27/2006  . BPH associated with nocturia 11/27/2006  . ERECTILE DYSFUNCTION, ORGANIC 11/27/2006   Rico Junker, PT, DPT 05/25/19    3:02 PM    [facility] [address] [place], [place], [address] Phone: [phone]   Fax:  [phone]  Name: Troy Combs MRN:  [id number] Date of Birth: 07-01-1931

## 2019-05-25 NOTE — Addendum Note (Signed)
Addended by: Rico Junker on: 05/25/2019 02:47 PM   Modules accepted: Orders

## 2019-05-27 ENCOUNTER — Ambulatory Visit: Payer: Medicare Other | Admitting: Physical Therapy

## 2019-06-03 ENCOUNTER — Ambulatory Visit: Payer: Medicare Other | Attending: Family Medicine | Admitting: Physical Therapy

## 2019-06-03 ENCOUNTER — Encounter: Payer: Self-pay | Admitting: Physical Therapy

## 2019-06-03 ENCOUNTER — Other Ambulatory Visit: Payer: Self-pay

## 2019-06-03 DIAGNOSIS — R296 Repeated falls: Secondary | ICD-10-CM | POA: Insufficient documentation

## 2019-06-03 DIAGNOSIS — R2681 Unsteadiness on feet: Secondary | ICD-10-CM

## 2019-06-03 DIAGNOSIS — M6281 Muscle weakness (generalized): Secondary | ICD-10-CM | POA: Insufficient documentation

## 2019-06-03 DIAGNOSIS — M545 Low back pain: Secondary | ICD-10-CM | POA: Insufficient documentation

## 2019-06-03 DIAGNOSIS — R2689 Other abnormalities of gait and mobility: Secondary | ICD-10-CM | POA: Insufficient documentation

## 2019-06-03 DIAGNOSIS — G8929 Other chronic pain: Secondary | ICD-10-CM | POA: Diagnosis present

## 2019-06-04 NOTE — Therapy (Signed)
[facility] [address] [place], [place], [address] Phone: [phone]   Fax:  [phone]  Physical Therapy Treatment  Patient Details  Name: Troy Combs MRN: [id number] Date of Birth: December 24, 1931 Referring Provider (PT): Antony Contras MD    Encounter Date: 06/03/2019  PT End of Session - 06/03/19 1405    Visit Number  16    Number of Visits  22    Date for PT Re-Evaluation  06/19/19    Authorization Type  [employer] Medicare - 10th visit PN    PT Start Time  1402    PT Stop Time  1442    PT Time Calculation (min)  40 min    Activity Tolerance  Patient tolerated treatment well    Behavior During Therapy  [facility] for tasks assessed/performed       Past Medical History:  Diagnosis Date  . Allergic rhinitis 09/20/2014  . Anemia, unspecified   . Arthritis   . Bladder neck obstruction   . Cataracts, bilateral   . Coronary atherosclerosis of unspecified type of vessel, native or graft   . Diminished hearing, bilateral   . Dysthymic disorder   . GERD (gastroesophageal reflux disease)   . Heart murmur    as a child  . Impaired glucose tolerance 12/02/2012  . Impotence of organic origin   . Lumbar radiculopathy   . Malignant melanoma of skin of upper limb, including shoulder ([place])   . Old myocardial infarction   . Personal history of malignant melanoma of skin   . Personal history of other diseases of digestive system   . Thoracic or lumbosacral neuritis or radiculitis, unspecified   . Unspecified essential hypertension     Past Surgical History:  Procedure Laterality Date  . ARTHRODESIS  07/06/2002   of left long finger distal interphalangeal joint with Kirschner wire fixation X 3  . ATRIAL ABLATION SURGERY  05/04/2001   Dr. [name]  . CARDIAC CATHETERIZATION  09/05/2008   Revealing 4 of 4 patent grafts with native multivessel coronary artery disease, EF  of 60% without regional wall motion abnormalities.  Marland Kitchen  CATARACT EXTRACTION, BILATERAL    . CHOLECYSTECTOMY    . COLONOSCOPY    . CORONARY ANGIOPLASTY  1993  . CORONARY ARTERY BYPASS GRAFT  10/17/2000   [name]. [name], [place]     AFTER LIVER TRAUMA AND HAND SURGERY  . EYE SURGERY    . HERNIA REPAIR     BILATERAL  . LEFT HEART CATH AND CORS/GRAFTS ANGIOGRAPHY N/A 04/03/2017   Procedure: LEFT HEART CATH AND CORS/GRAFTS ANGIOGRAPHY;  Surgeon: Leonie Man, MD;  Location: [facility];  Service: Cardiovascular;  Laterality: N/A;  . LUMBAR LAMINECTOMY/DECOMPRESSION MICRODISCECTOMY N/A 02/02/2016   Procedure: MICRODISCECTOMY LUMBAR FOUR- LUMBAR FIVE;  Surgeon: Ashok Pall, MD;  Location: [facility];  Service: Neurosurgery;  Laterality: N/A;  MICRODISCECTOMY L4-L5  . LUMBAR LAMINECTOMY/DECOMPRESSION MICRODISCECTOMY Right 08/02/2016   Procedure: MICRODISCECTOMY LUMBAR FOUR- LUMBAR FIVE RIGHT;  Surgeon: Ashok Pall, MD;  Location: [facility];  Service: Neurosurgery;  Laterality: Right;  . punctured eardrum      to relieve blood build up   for ear infection  . TONSILLECTOMY      There were no vitals filed for this visit.  Subjective Assessment - 06/03/19 1402    Subjective  Did not bring the up walker today. "Too much to load, unload, load and unload again". Using it in the house.  Has not gone outside with it due to bad weather. No change in dizziness, mostly when he turns his head quickly.    Pertinent History  monocular vertical diplopia, HTN, CAD s/p MI, CABG, neurogenic orthostatic hypotension, neuropathy, cervicogenic HA    Limitations  Standing;Walking    How long can you stand comfortably?  10 minutes    Patient Stated Goals  maintain high QOL, get back to doing household chores without pain, get out and walk, to feel safe walking/with balance in order for him and his wife to get a dog    Currently in Pain?  Yes    Pain Score  2     Pain Location  Back    Pain Orientation  Lower    Pain Descriptors / Indicators   Aching;Dull    Pain Type  Chronic pain    Pain Onset  More than a month ago    Aggravating Factors   bending over when standing    Pain Relieving Factors  heating pads, pain meds in the evening, stopping the activiity that is bothering him    Multiple Pain Sites  Yes    Pain Score  6    Pain Location  Knee    Pain Orientation  Right;Left    Pain Descriptors / Indicators  Dull;Aching;Other (Comment)   weakness   Pain Type  Chronic pain    Pain Onset  More than a month ago    Pain Frequency  Intermittent    Aggravating Factors   standing up, walking    Pain Relieving Factors  pain medicine (Advil), heat, resting           [facility] Adult PT Treatment/Exercise - 06/03/19 1407      Transfers   Transfers  Sit to Stand;Stand to Sit    Sit to Stand  6: Modified independent (Device/Increase time);With upper extremity assist;From bed    Stand to Sit  6: Modified independent (Device/Increase time);With upper extremity assist;To bed      Ambulation/Gait   Ambulation/Gait  Yes    Ambulation/Gait Assistance  6: Modified independent (Device/Increase time)    Ambulation/Gait Assistance Details  no balance loss with good control of Upwalker on inclines/declines    Ambulation Distance (Feet)  1000 Feet    Assistive device  Other (Comment)   Upwalker   Gait Pattern  Step-through pattern;Decreased arm swing - right;Decreased arm swing - left;Decreased stride length;Decreased step length - left;Decreased step length - right;Decreased hip/knee flexion - right;Decreased hip/knee flexion - left;Decreased trunk rotation;Trunk flexed;Wide base of support    Ambulation Surface  Level;Indoor      High Level Balance   High Level Balance Activities  Marching forwards;Marching backwards;Tandem walking;Side stepping   tandem fwd/bwd   High Level Balance Comments  on blue mat in parallel bars: 3 laps each way with light UE support on bars. Cues on form and technique.         Balance Exercises - 06/03/19  1432      Balance Exercises: Standing   Standing Eyes Opened  Wide (BOA);Foam/compliant surface;3 reps;30 secs;Limitations    Standing Eyes Opened Limitations  on airex with feet apart: no holding for EO tall posture balance for 30 sec's, rest between with UE support. min guard to min assist for balance.     SLS with Vectors  Foam/compliant surface;Other reps (comment);Limitations    SLS with Vectors Limitations  on airex with 2 tall cones in front: alternating fwd foot taps, then cross  foot taps, then fwd double foot taps, ending with cross double foot taps for 10 reps each wiht light UE support on bars. cues for stance position, weight shifting and posture.           PT Short Term Goals - 05/25/19 1439      PT SHORT TERM GOAL #1   Title  = LTG        PT Long Term Goals - 05/25/19 1439      PT LONG TERM GOAL #1   Title  Pt reports ability to ambulate >/= 30 minutes with LRAD without increase in pain. (all LTG target date: 06/19/2019)    Baseline  05/17/19: Has been walking in the park 1/4 of a mile - walks with wife who walks slower - 15-18 minutes with walking stick - no increase in pain.    Time  4    Period  Weeks    Status  Revised    Target Date  06/19/19      PT LONG TERM GOAL #2   Title  He will improve [place] scale of kinesiophobia to less than 28 to show less fear about pain and disability.    Baseline  increased to 40 at re-certification despite pt reporting less pain and greater function    Time  4    Period  Weeks    Status  Revised    Target Date  06/19/19      PT LONG TERM GOAL #3   Title  Pt will report being able to stand >20 minutes at home to perform household chores with pain no >3/10    Baseline  05/17/19; 20 minutes of standing for kitchen chores - needs to sit after performing chores due to pain >3/10    Time  4    Period  Weeks    Status  Revised    Target Date  06/19/19      PT LONG TERM GOAL #4   Title  Will improve 5xSTS to 13 seconds or less     Baseline  05/17/19: 17  > 15 seconds    Time  4    Period  Weeks    Status  Revised    Target Date  06/19/19      PT LONG TERM GOAL #5   Title  Will improve BERG to at least 46 to show improved balance.    Baseline  05/17/19: 44/56    Time  4    Period  Weeks    Status  Revised    Target Date  06/19/19      PT LONG TERM GOAL #6   Title  Pt will demonstrate 25% greater lumbar ROM into extension and side flexion   continues to be limited, has improved since eval   Time  4    Period  Weeks    Status  Revised    Target Date  06/19/19      PT LONG TERM GOAL #8   Title  --   score decreased to 40 on reassessment on 05/20/19           Plan - 06/03/19 1913    Clinical Impression Statement  Today's skilled session continued to focus on gait outdoors with Upwalker with pt at a Mod I level on paved surfaces. Will need further practice with compliant surfaces and barriers such as curbs. Remainder of session focused on balance reactions with rest breaks taken as needed. The pt is progressing  toward goals and should benefit from continued PT to progress toward unmet goals.    Personal Factors and Comorbidities  Age;Comorbidity 3+;Behavior Pattern;Fitness;Past/Current Experience;Time since onset of injury/illness/exacerbation    Comorbidities  monocular vertical diplopia, HTN, CAD s/p MI, CABG, neurogenic orthostatic hypotension, neuropathy, cervicogenic HA, microdiscectomy (2017, 2018)    Examination-Activity Limitations  Locomotion Level;Stairs;Stand    Examination-Participation Restrictions  Cleaning;Community Activity;Laundry;Yard Work    [employer]  --   pt stated not wanting to do exercises at home   PT Frequency  2x / week    PT Duration  4 weeks    PT Treatment/Interventions  ADLs/Self Care Home Management;Canalith Repostioning;Gait training;Stair training;Therapeutic activities;Therapeutic exercise;Balance training;Patient/family education;Neuromuscular re-education;Manual  techniques;Vestibular;Electrical Stimulation;Moist Heat;DME Instruction;Functional mobility training;Passive range of motion;Dry needling;Spinal Manipulations;Cryotherapy    PT Next Visit Plan  continue to work on outdoor uneven/compliant surfaces, curbs and ramps with Upwalker. 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    PT Home Exercise Plan  Access Code: [email or web address] and Agree with Plan of Care  Patient       Patient will benefit from skilled therapeutic intervention in order to improve the following deficits and impairments:  Abnormal gait, Decreased range of motion, Difficulty walking, Dizziness, Decreased activity tolerance, Decreased endurance, Pain, Decreased balance, Decreased strength, Decreased mobility, Impaired flexibility, Impaired sensation  Visit Diagnosis: Other abnormalities of gait and mobility  Unsteadiness on feet  Muscle weakness (generalized)     Problem List Patient Active Problem List   Diagnosis Date Noted  . Personal history of malignant melanoma of skin   . Lumbar radiculopathy   . Heart murmur   . GERD (gastroesophageal reflux disease)   . Diminished hearing, bilateral   . Cataracts, bilateral   . Bladder neck obstruction   . Arthritis   . Constipation 12/12/2016  . Nausea 12/12/2016  . Coronary artery disease involving native coronary artery of native heart without angina pectoris 11/22/2016  . Dyslipidemia 11/22/2016  . History of coronary artery bypass surgery 11/22/2016  . Depression with anxiety   . Orthostasis 09/20/2016  . HNP (herniated nucleus pulposus), lumbar 02/02/2016  . Spinal stenosis of lumbar region 01/24/2016  . Sciatica, right side 01/24/2016  . Prediabetes 01/24/2016  . Otitis, externa, infective 01/24/2016  . Left lumbar radiculopathy 01/15/2016  . PVC's (premature ventricular contractions) 05/24/2015  . Peripheral polyneuropathy 03/06/2015  . BPPV (benign paroxysmal positional vertigo) 03/06/2015  .  Headache 01/18/2015  . Vertigo 01/18/2015  . Allergic rhinitis 09/20/2014  . Dyspnea on exertion 08/10/2014  . Severe dizziness 08/10/2014  . Acute diverticulitis 05/18/2014  . Diarrhea 05/18/2014  . LLQ pain 05/13/2014  . Chronic meniscal tear of knee 09/22/2013  . Primary localized osteoarthrosis, lower leg 09/22/2013  . Neck pain, bilateral 12/18/2012  . Headache(784.0) 12/18/2012  . Binocular visual disturbance 12/18/2012  . Epiretinal membrane 12/17/2012  . Status post intraocular lens implant 12/17/2012  . Preventative health care 12/02/2012  . Low back pain 12/02/2012  . Impaired glucose tolerance 12/02/2012  . Palpitations 10/09/2010  . B12 deficiency 08/30/2010  . Hearing loss 08/30/2010  . Tinnitus 08/30/2010  . Neurogenic orthostatic hypotension ([place]) 06/28/2009  . HYPERLIPIDEMIA TYPE IIB / III 05/15/2009  . CHEST PAIN UNSPECIFIED 11/23/2008  . Hypotension, unspecified 10/12/2008  . DIZZINESS 10/12/2008  . Shortness of breath 09/14/2008  . UNSPECIFIED ANEMIA 09/13/2008  . ANXIETY DEPRESSION 09/13/2008  . Old myocardial infarction 09/13/2008  . GASTROESOPHAGEAL REFLUX DISEASE, HX OF 09/13/2008  . LUMBAR RADICULOPATHY, LEFT  08/23/2008  . MELANOMA, SHOULDER 11/27/2006  . Essential hypertension 11/27/2006  . Coronary atherosclerosis 11/27/2006  . BPH associated with nocturia 11/27/2006  . ERECTILE DYSFUNCTION, ORGANIC 11/27/2006    Willow Ora, PTA, [facility] [address] [place], [place] [address] [phone] 06/04/19, 8:23 PM   Name: HATCHER FRITH MRN: [id number] Date of Birth: 08/09/1931

## 2019-06-07 ENCOUNTER — Ambulatory Visit: Payer: Medicare Other | Admitting: Neurology

## 2019-06-10 ENCOUNTER — Ambulatory Visit: Payer: Medicare Other | Admitting: Physical Therapy

## 2019-06-17 ENCOUNTER — Ambulatory Visit: Payer: Medicare Other | Admitting: Rehabilitation

## 2019-06-18 ENCOUNTER — Ambulatory Visit: Payer: Medicare Other | Admitting: Neurology

## 2019-06-24 ENCOUNTER — Other Ambulatory Visit: Payer: Self-pay

## 2019-06-24 ENCOUNTER — Ambulatory Visit: Payer: Medicare Other | Admitting: Physical Therapy

## 2019-06-24 ENCOUNTER — Encounter: Payer: Self-pay | Admitting: Physical Therapy

## 2019-06-24 DIAGNOSIS — R2689 Other abnormalities of gait and mobility: Secondary | ICD-10-CM

## 2019-06-24 DIAGNOSIS — M545 Low back pain, unspecified: Secondary | ICD-10-CM

## 2019-06-24 DIAGNOSIS — M6281 Muscle weakness (generalized): Secondary | ICD-10-CM

## 2019-06-24 DIAGNOSIS — G8929 Other chronic pain: Secondary | ICD-10-CM

## 2019-06-24 DIAGNOSIS — R296 Repeated falls: Secondary | ICD-10-CM

## 2019-06-24 DIAGNOSIS — R2681 Unsteadiness on feet: Secondary | ICD-10-CM

## 2019-06-24 NOTE — Therapy (Signed)
[facility] [address] [place], [place], [address] Phone: [phone]   Fax:  [phone]  Physical Therapy Treatment  Patient Details  Name: Troy Combs MRN: [id number] Date of Birth: Jun 09, 1931 Referring Provider (PT): Antony Contras MD    Encounter Date: 06/24/2019  PT End of Session - 06/24/19 1638    Visit Number  17    Number of Visits  23    Date for PT Re-Evaluation  08/08/19    Authorization Type  [employer] Medicare - 10th visit PN    PT Start Time  1454    PT Stop Time  1535    PT Time Calculation (min)  41 min    Activity Tolerance  Patient tolerated treatment well    Behavior During Therapy  [facility] for tasks assessed/performed       Past Medical History:  Diagnosis Date  . Allergic rhinitis 09/20/2014  . Anemia, unspecified   . Arthritis   . Bladder neck obstruction   . Cataracts, bilateral   . Coronary atherosclerosis of unspecified type of vessel, native or graft   . Diminished hearing, bilateral   . Dysthymic disorder   . GERD (gastroesophageal reflux disease)   . Heart murmur    as a child  . Impaired glucose tolerance 12/02/2012  . Impotence of organic origin   . Lumbar radiculopathy   . Malignant melanoma of skin of upper limb, including shoulder ([place])   . Old myocardial infarction   . Personal history of malignant melanoma of skin   . Personal history of other diseases of digestive system   . Thoracic or lumbosacral neuritis or radiculitis, unspecified   . Unspecified essential hypertension     Past Surgical History:  Procedure Laterality Date  . ARTHRODESIS  07/06/2002   of left long finger distal interphalangeal joint with Kirschner wire fixation X 3  . ATRIAL ABLATION SURGERY  05/04/2001   Dr. [name]  . CARDIAC CATHETERIZATION  09/05/2008   Revealing 4 of 4 patent grafts with native multivessel coronary artery disease, EF  of 60% without regional wall motion abnormalities.  Marland Kitchen  CATARACT EXTRACTION, BILATERAL    . CHOLECYSTECTOMY    . COLONOSCOPY    . CORONARY ANGIOPLASTY  1993  . CORONARY ARTERY BYPASS GRAFT  10/17/2000   [name]. [name], [place]     AFTER LIVER TRAUMA AND HAND SURGERY  . EYE SURGERY    . HERNIA REPAIR     BILATERAL  . LEFT HEART CATH AND CORS/GRAFTS ANGIOGRAPHY N/A 04/03/2017   Procedure: LEFT HEART CATH AND CORS/GRAFTS ANGIOGRAPHY;  Surgeon: Leonie Man, MD;  Location: [facility];  Service: Cardiovascular;  Laterality: N/A;  . LUMBAR LAMINECTOMY/DECOMPRESSION MICRODISCECTOMY N/A 02/02/2016   Procedure: MICRODISCECTOMY LUMBAR FOUR- LUMBAR FIVE;  Surgeon: Ashok Pall, MD;  Location: [facility];  Service: Neurosurgery;  Laterality: N/A;  MICRODISCECTOMY L4-L5  . LUMBAR LAMINECTOMY/DECOMPRESSION MICRODISCECTOMY Right 08/02/2016   Procedure: MICRODISCECTOMY LUMBAR FOUR- LUMBAR FIVE RIGHT;  Surgeon: Ashok Pall, MD;  Location: [facility];  Service: Neurosurgery;  Laterality: Right;  . punctured eardrum      to relieve blood build up   for ear infection  . TONSILLECTOMY      There were no vitals filed for this visit.  Subjective Assessment - 06/24/19 1457    Subjective  Pt has still been walking with upright walker in house, hasn't been able to walk outside much due to the weather.  Had one day where he got violently ill, only happened once and wasn't sure it was related to dizziness.  Seems like his pain has regressed, back to only being able to stand for about 5 minutes before needing to sit due to back pain 7/10; showers cause significant fatigue and is having a hard time getting out of the chair.  Doing exercises rarely.    Pertinent History  monocular vertical diplopia, HTN, CAD s/p MI, CABG, neurogenic orthostatic hypotension, neuropathy, cervicogenic HA    Limitations  Standing;Walking    How long can you stand comfortably?  10 minutes    Patient Stated Goals  maintain high QOL, get back to doing household chores  without pain, get out and walk, to feel safe walking/with balance in order for him and his wife to get a dog    Pain Onset  More than a month ago    Pain Onset  More than a month ago         [facility] PT Assessment - 06/24/19 1637      Assessment   Medical Diagnosis  Lumbar spinal stenosis    Referring Provider (PT)  Antony Contras MD     Onset Date/Surgical Date  --   4 years ago     Precautions   Precautions  Fall      Prior Function   Level of Independence  Independent      Observation/Other Assessments   Focus on Therapeutic Outcomes (FOTO)   Not assessed                   [facility] Adult PT Treatment/Exercise - 06/24/19 1628      Therapeutic Activites    Therapeutic Activities  Other Therapeutic Activities    Other Therapeutic Activities  Addressed patient's report that his pain has returned to a 7/10, his standing tolerance had regressed to 5 minutes and is having more difficulty getting out of a chair.  Pt does report he has not been performing his HEP over the past few weeks and has had less opportunities to walk due to weather.  Discussed how patient's symptoms changed and responded when he was performing exercises and walking more regularly - read to patient previous notes of his accomplishments and improvements.  Reinforced importance of movement for joint health and strength and purpose of therapy to provide pt with tools they need to maintain their health, mobility and quality of life.  Discussed realistic expectations about pain and the importance of resistance strengthening and aerobic exercises.  Discussed setting a timer to have pt stop writing one hour before time to fix dinner and to walk and perform stretches to see if this improves his ability to perform cooking task without having to sit at 5 minutes.  Pt willing to try.        Exercises   Exercises  Lumbar      Lumbar Exercises: Stretches   Active Hamstring Stretch  Right;Left;Other (comment)    Active  Hamstring Stretch Limitations  10 reps knee flexion<>extension for sciatic nn slider; supine    Single Knee to Chest Stretch  Right;Left;3 reps;20 seconds    Double Knee to Chest Stretch  2 reps;20 seconds    Lower Trunk Rotation  5 reps      Lumbar Exercises: Supine   Bridge  Non-compliant;10 reps    Bridge Limitations  small bridge with glute activation        Access Code: [email or web address] URL: [email or web address] Date: 06/24/2019 Prepared  by: Misty Stanley  Exercises Hooklying Single Knee to Chest Stretch - 4 reps - 20 hold - 1x daily - 6x weekly Supine Double Knee to Chest - 2 sets - 20 second hold - 1x daily - 7x weekly Supine Lower Trunk Rotation - 5 reps - 2 sets - 2x daily - 6x weekly Supine Hamstring Stretch - 10 reps - 2 sets - 1x daily - 7x weekly Beginner Bridge - 5 reps - 2 sets - 1x daily - 7x weekly       PT Education - 06/24/19 1636    Education Details  reviewed and updated HEP; see TA.    Person(s) Educated  Patient    Methods  Explanation;Demonstration;Handout    Comprehension  Verbalized understanding;Returned demonstration       PT Short Term Goals - 05/25/19 1439      PT SHORT TERM GOAL #1   Title  = LTG        PT Long Term Goals - 06/24/19 1638      PT LONG TERM GOAL #1   Title  Pt reports ability to ambulate >/= 30 minutes with LRAD without increase in pain. (all LTG target date: 08/08/2019)    Baseline  Has not been able to walk due to weather, has been walking inside    Time  6    Period  Weeks    Status  Revised    Target Date  08/08/19      PT LONG TERM GOAL #2   Title  He will improve [place] scale of kinesiophobia to less than 28 to show less fear about pain and disability.    Baseline  increased to 40 at re-certification despite pt reporting less pain and greater function    Time  6    Period  Weeks    Status  Revised    Target Date  08/08/19      PT LONG TERM GOAL #3   Title  Pt will report being able to stand >20  minutes at home to perform household chores with pain no >3/10    Baseline  has regressed to standing 5 minutes with pain >7/10    Time  6    Period  Weeks    Status  Revised    Target Date  08/08/19      PT LONG TERM GOAL #4   Title  Will improve 5xSTS to 13 seconds or less    Baseline  05/17/19: 17  > 15 seconds    Time  6    Period  Weeks    Status  On-going    Target Date  08/08/19      PT LONG TERM GOAL #5   Title  Will improve BERG to at least 46 to show improved balance.    Baseline  05/17/19: 44/56    Time  6    Period  Weeks    Status  On-going    Target Date  08/08/19      PT LONG TERM GOAL #6   Title  Pt will report compliance with HEP 4/7 days a week   continues to be limited, has improved since eval   Time  6    Period  Weeks    Status  New    Target Date  08/08/19      PT LONG TERM GOAL #8   Title  --   score decreased to 40 on reassessment on 05/20/19  Plan - 06/24/19 1643    Clinical Impression Statement  Pt has been unable to attend the past few weeks due to illness and weather.  Due to multiple weeks of missed treatment and pt lack of mobility at home (less able to walk, has not been as compliant with HEP) pt has experienced a regression in his pain and overall functional level.  Discussed importance of maintaining exercise and mobility for QOL and pain management.  Reviewed and updated HEP and pt recertified for more visits.  Pt agreeable to continue to work on exercises and walking at home.  Will continue to address impairments in LE strength, ROM, impaired balance, gait and pain to maximize functional mobility independence and decrease falls risk.    Personal Factors and Comorbidities  Age;Comorbidity 3+;Behavior Pattern;Fitness;Past/Current Experience;Time since onset of injury/illness/exacerbation    Comorbidities  monocular vertical diplopia, HTN, CAD s/p MI, CABG, neurogenic orthostatic hypotension, neuropathy, cervicogenic HA,  microdiscectomy (2017, 2018)    Examination-Activity Limitations  Locomotion Level;Stairs;Stand    Examination-Participation Restrictions  Cleaning;Community Activity;Laundry;Yard Work    Water engineer  Good   pt stated not wanting to do exercises at home   PT Frequency  1x / week    PT Duration  6 weeks    PT Treatment/Interventions  ADLs/Self Care Home Management;Gait training;Stair training;Therapeutic activities;Therapeutic exercise;Balance training;Patient/family education;Neuromuscular re-education;Manual techniques;Electrical Stimulation;Moist Heat;DME Instruction;Functional mobility training;Passive range of motion;Dry needling;Cryotherapy    PT Next Visit Plan  has he been performing HEP before cooking dinner - is it helping?  Hamstring stretches, sit > stand training.  Multi sensory balance training, especially compliant surfaces    PT Home Exercise Plan  Access Code: [email or web address]    Recommended Other Services  [email or web address]    Consulted and Agree with Plan of Care  Patient       Patient will benefit from skilled therapeutic intervention in order to improve the following deficits and impairments:  Abnormal gait, Decreased range of motion, Difficulty walking, Decreased activity tolerance, Decreased endurance, Pain, Decreased balance, Decreased strength, Decreased mobility, Impaired flexibility, Impaired sensation  Visit Diagnosis: Other abnormalities of gait and mobility  Unsteadiness on feet  Muscle weakness (generalized)  Chronic midline low back pain without sciatica  Repeated falls     Problem List Patient Active Problem List   Diagnosis Date Noted  . Personal history of malignant melanoma of skin   . Lumbar radiculopathy   . Heart murmur   . GERD (gastroesophageal reflux disease)   . Diminished hearing, bilateral   . Cataracts, bilateral   . Bladder neck obstruction   . Arthritis   . Constipation 12/12/2016  . Nausea 12/12/2016  .  Coronary artery disease involving native coronary artery of native heart without angina pectoris 11/22/2016  . Dyslipidemia 11/22/2016  . History of coronary artery bypass surgery 11/22/2016  . Depression with anxiety   . Orthostasis 09/20/2016  . HNP (herniated nucleus pulposus), lumbar 02/02/2016  . Spinal stenosis of lumbar region 01/24/2016  . Sciatica, right side 01/24/2016  . Prediabetes 01/24/2016  . Otitis, externa, infective 01/24/2016  . Left lumbar radiculopathy 01/15/2016  . PVC's (premature ventricular contractions) 05/24/2015  . Peripheral polyneuropathy 03/06/2015  . BPPV (benign paroxysmal positional vertigo) 03/06/2015  . Headache 01/18/2015  . Vertigo 01/18/2015  . Allergic rhinitis 09/20/2014  . Dyspnea on exertion 08/10/2014  . Severe dizziness 08/10/2014  . Acute diverticulitis 05/18/2014  . Diarrhea 05/18/2014  . LLQ pain 05/13/2014  . Chronic meniscal tear of knee 09/22/2013  .  Primary localized osteoarthrosis, lower leg 09/22/2013  . Neck pain, bilateral 12/18/2012  . Headache(784.0) 12/18/2012  . Binocular visual disturbance 12/18/2012  . Epiretinal membrane 12/17/2012  . Status post intraocular lens implant 12/17/2012  . Preventative health care 12/02/2012  . Low back pain 12/02/2012  . Impaired glucose tolerance 12/02/2012  . Palpitations 10/09/2010  . B12 deficiency 08/30/2010  . Hearing loss 08/30/2010  . Tinnitus 08/30/2010  . Neurogenic orthostatic hypotension ([place]) 06/28/2009  . HYPERLIPIDEMIA TYPE IIB / III 05/15/2009  . CHEST PAIN UNSPECIFIED 11/23/2008  . Hypotension, unspecified 10/12/2008  . DIZZINESS 10/12/2008  . Shortness of breath 09/14/2008  . UNSPECIFIED ANEMIA 09/13/2008  . ANXIETY DEPRESSION 09/13/2008  . Old myocardial infarction 09/13/2008  . GASTROESOPHAGEAL REFLUX DISEASE, HX OF 09/13/2008  . LUMBAR RADICULOPATHY, LEFT 08/23/2008  . MELANOMA, SHOULDER 11/27/2006  . Essential hypertension 11/27/2006  . Coronary  atherosclerosis 11/27/2006  . BPH associated with nocturia 11/27/2006  . ERECTILE DYSFUNCTION, ORGANIC 11/27/2006    Rico Junker, PT, DPT 06/24/19    4:49 PM    [facility] [address] [place], [place], [address] Phone: [phone]   Fax:  [phone]  Name: Troy Combs MRN: [id number] Date of Birth: 01-06-1932

## 2019-06-24 NOTE — Patient Instructions (Signed)
Access Code: [email or web address] URL: [email or web address] Date: 06/24/2019 Prepared by: Misty Stanley  Exercises Hooklying Single Knee to Chest Stretch - 4 reps - 20 hold - 1x daily - 6x weekly Supine Double Knee to Chest - 2 sets - 20 second hold - 1x daily - 7x weekly Supine Lower Trunk Rotation - 5 reps - 2 sets - 2x daily - 6x weekly Supine Hamstring Stretch - 10 reps - 2 sets - 1x daily - 7x weekly Beginner Bridge - 5 reps - 2 sets - 1x daily - 7x weekly

## 2019-06-25 ENCOUNTER — Ambulatory Visit: Payer: Medicare Other | Attending: Internal Medicine

## 2019-06-25 DIAGNOSIS — Z23 Encounter for immunization: Secondary | ICD-10-CM

## 2019-06-25 NOTE — Progress Notes (Signed)
   Covid-19 Vaccination Clinic  Name:  BODHI SIEW    MRN: [id number] DOB: [birth year]  06/25/2019  Mr. Burgett was observed post Covid-19 immunization for 30 minutes based on pre-vaccination screening without incidence. He was provided with Vaccine Information Sheet and instruction to access the V-Safe system.   Mr. Hameed was instructed to call 911 with any severe reactions post vaccine: Marland Kitchen Difficulty breathing  . Swelling of your face and throat  . A fast heartbeat  . A bad rash all over your body  . Dizziness and weakness    Immunizations Administered    Name Date Dose VIS Date Route   Pfizer COVID-19 Vaccine 06/25/2019  9:10 AM 0.3 mL 04/09/2019 Intramuscular   Manufacturer: [place]   Lot: [id number]   [place]: [id number]

## 2019-07-01 ENCOUNTER — Ambulatory Visit: Payer: Medicare Other | Admitting: Rehabilitation

## 2019-07-07 NOTE — Progress Notes (Deleted)
Cardiology Office Note    Date:  07/07/2019   ID:  Troy Combs, DOB November 08, 1931, MRN [id number]  PCP:  Troy Combs  Cardiologist: No primary care provider on file. EPS: None  No chief complaint on file.   History of Present Illness:  Troy Combs is a 86 y.o. male with a history of CAD s/p CABG x4V (2002), HTN, atrial flutter, known orthostatic hypotension, symptomatic PVCs managed with Toprol but limited with hypotension later switched to Coreg.  Dyspnea on exertion 2016 exercise Myoview stress test low risk due to chronotropic incompetence and it was switched to Troy Combs 2D echo 2016 LVEF 65 to 70% grade 1 DD mild bileaflet MVP, 48 hour Holter 2017 infrequent PVCs and PACs, low risk stress test 2018.  Cardiac cath 03/2017 patent grafts not felt to be the cause of his dyspnea on exertion.  Saw Dr. Caryl Combs 08/2016 who felt orthostatic hypotension was likely related to longstanding systolic hypertension.   He is now followed for his autonomic dysfunction neurology by Dr. Margrett Combs and is on Northera midodrine and pyridostigmine.  Last seen by Dr. Meda Combs 12/2018 which time he had some atypical chest pain felt secondary to GERD.  Doing hydralazine 25 mg as needed for blood pressure over 99991111 systolic.  Past Medical History:  Diagnosis Date  . Allergic rhinitis 09/20/2014  . Anemia, unspecified   . Arthritis   . Bladder neck obstruction   . Cataracts, bilateral   . Coronary atherosclerosis of unspecified type of vessel, native or graft   . Diminished hearing, bilateral   . Dysthymic disorder   . GERD (gastroesophageal reflux disease)   . Heart murmur    as a child  . Impaired glucose tolerance 12/02/2012  . Impotence of organic origin   . Lumbar radiculopathy   . Malignant melanoma of skin of upper limb, including shoulder ([place])   . Old myocardial infarction   . Personal history of malignant melanoma of skin   . Personal history of other diseases of digestive system   .  Thoracic or lumbosacral neuritis or radiculitis, unspecified   . Unspecified essential hypertension     Past Surgical History:  Procedure Laterality Date  . ARTHRODESIS  07/06/2002   of left long finger distal interphalangeal joint with Kirschner wire fixation X 3  . ATRIAL ABLATION SURGERY  05/04/2001   Dr. Cristopher Combs  . CARDIAC CATHETERIZATION  09/05/2008   Revealing 4 of 4 patent grafts with native multivessel coronary artery disease, EF  of 60% without regional wall motion abnormalities.  Marland Kitchen CATARACT EXTRACTION, BILATERAL    . CHOLECYSTECTOMY    . COLONOSCOPY    . CORONARY ANGIOPLASTY  1993  . CORONARY ARTERY BYPASS GRAFT  10/17/2000   Troy Combs. Troy Combs, [place]     AFTER LIVER TRAUMA AND HAND SURGERY  . EYE SURGERY    . HERNIA REPAIR     BILATERAL  . LEFT HEART CATH AND CORS/GRAFTS ANGIOGRAPHY N/A 04/03/2017   Procedure: LEFT HEART CATH AND CORS/GRAFTS ANGIOGRAPHY;  Surgeon: Troy Man, Combs;  Location: [facility];  Service: Cardiovascular;  Laterality: N/A;  . LUMBAR LAMINECTOMY/DECOMPRESSION MICRODISCECTOMY N/A 02/02/2016   Procedure: MICRODISCECTOMY LUMBAR FOUR- LUMBAR FIVE;  Surgeon: Troy Pall, Combs;  Location: [facility];  Service: Neurosurgery;  Laterality: N/A;  MICRODISCECTOMY L4-L5  . LUMBAR LAMINECTOMY/DECOMPRESSION MICRODISCECTOMY Right 08/02/2016   Procedure: MICRODISCECTOMY LUMBAR FOUR- LUMBAR FIVE RIGHT;  Surgeon: Troy Pall, Combs;  Location: [place];  Service: Neurosurgery;  Laterality: Right;  . punctured eardrum      to relieve blood build up   for ear infection  . TONSILLECTOMY      Current Medications: No outpatient medications have been marked as taking for the 07/13/19 encounter (Appointment) with Troy Burn, PA-C.     Allergies:   Iohexol, Ciprofloxacin, and Other   Social History   Socioeconomic History  . Marital status: Married    Spouse name: Not on file  . Number of children: 5  . Years of education: Not on  file  . Highest education level: Not on file  Occupational History  . Not on file  Tobacco Use  . Smoking status: Former Smoker    Packs/day: 1.00    Years: 3.00    Pack years: 3.00    Types: Cigarettes    Quit date: 09/21/1955    Years since quitting: 63.8  . Smokeless tobacco: Never Used  Substance and Sexual Activity  . Alcohol use: Yes    Alcohol/week: 0.0 standard drinks    Comment: one or two ounces a week  . Drug use: No  . Sexual activity: Not on file  Other Topics Concern  . Not on file  Social History Narrative   Lives with wife in a one story home.     Has 5 children.  (2 are deceased now) Retired Freight forwarder for [employer].     Education: 2 years of college.    Social Determinants of Health   Financial Resource Strain:   . Difficulty of Paying Living Expenses: Not on file  Food Insecurity:   . Worried About Charity fundraiser in the Last Year: Not on file  . Ran Out of Food in the Last Year: Not on file  Transportation Needs:   . Lack of Transportation (Medical): Not on file  . Lack of Transportation (Non-Medical): Not on file  Physical Activity:   . Days of Exercise per Week: Not on file  . Minutes of Exercise per Session: Not on file  Stress:   . Feeling of Stress : Not on file  Social Connections:   . Frequency of Communication with Friends and Family: Not on file  . Frequency of Social Gatherings with Friends and Family: Not on file  . Attends Religious Services: Not on file  . Active Member of Clubs or Organizations: Not on file  . Attends Archivist Meetings: Not on file  . Marital Status: Not on file     Family History:  The patient's ***family history includes Heart disease in his father.   ROS:   Please see the history of present illness.    ROS All other systems reviewed and are negative.   PHYSICAL EXAM:   VS:  There were no vitals taken for this visit.  Physical Exam  GEN: Well nourished, well developed, in no acute distress  HEENT:  normal  Neck: no JVD, carotid bruits, or masses Cardiac:RRR; no murmurs, rubs, or gallops  Respiratory:  clear to auscultation bilaterally, normal work of breathing GI: soft, nontender, nondistended, + BS Ext: without cyanosis, clubbing, or edema, Good distal pulses bilaterally MS: no deformity or atrophy  Skin: warm and dry, no rash Neuro:  Alert and Oriented x 3, Strength and sensation are intact Psych: euthymic mood, full affect  Wt Readings from Last 3 Encounters:  01/14/19 174 lb 3.2 oz (79 kg)  11/06/18 175 lb (79.4 kg)  05/01/18 183 lb (83 kg)  Studies/Labs Reviewed:   EKG:  EKG is*** ordered today.  The ekg ordered today demonstrates ***  Recent Labs: 01/14/2019: ALT 34; BUN 18; Creatinine, Ser 0.98; Hemoglobin 17.2; Platelets 200; Potassium 4.9; Sodium 138; TSH 2.380   Lipid Panel    Component Value Date/Time   CHOL 110 01/24/2016 0955   TRIG 133.0 01/24/2016 0955   TRIG 83 03/18/2006 0839   HDL 49.70 01/24/2016 0955   CHOLHDL 2 01/24/2016 0955   VLDL 26.6 01/24/2016 0955   LDLCALC 34 01/24/2016 0955    Additional studies/ records that were reviewed today include:  Cardiac cath 04/03/2017   Mid LAD lesion is 99% stenosed. Ost 2nd Diag to 2nd Diag lesion is 40% stenosed.  Widely patent LIMA-LAD. The distal LAD wraps the apex.  Ost 1st Mrg (OM1) lesion is 100% stenosed. Ost 2nd Mrg (OM2) lesion is 100% stenosed.  Widely patent sequential SVG-OM1-OM 2. Very small subbranch of OM1 has a 80-90% stenosis of the brain cannot be shown on the diagram.  Ost RCA to Dist RCA lesion is 100% stenosed. Was previously 95% proximal stenosis.  Widely patent SVG-RPDA with retrograde filling of the PL system.  The left ventricular systolic function is normal. The left ventricular ejection fraction is 55-65% by visual estimate.  LV end diastolic pressure is mildly elevated.   Severe native vessel disease with occluded RCA, OM1, OM 2 essentially subtotal occlusion of the  LAD.  All of these major vessels are grafted with widely patent grafts and stable distal flow. Normal LV function with normal mildly elevated LVEDP in the setting of systemic hypertension from supine hypertension.   Likely the patient's exertional dyspnea is not related to ischemic coronary artery disease.   Plan: The patient will return to short stay for TR band removal.  He will be discharged home following bed rest.   He will follow-up with Dr. Meda Combs as scheduled.       Glenetta Hew, M.D., M.S.    ASSESSMENT:    No diagnosis found.   PLAN:  In order of problems listed above:  CAD status post CABG times 07/2000 chronic dyspnea on exertion last cardiac cath 03/2017 with widely patent grafts normal LV function.  Essential hypertension on as needed hydralazine for systolic blood pressure greater than 180  Orthostatic hypotension with autonomic dysfunction followed by Dr. Leo Grosser at [facility]  Hyperlipidemia  Medication Adjustments/Labs and Tests Ordered: Current medicines are reviewed at length with the patient today.  Concerns regarding medicines are outlined above.  Medication changes, Labs and Tests ordered today are listed in the Patient Instructions below. There are no Patient Instructions on file for this visit.   Sumner Boast, PA-C  07/07/2019 3:25 PM    [facility] [place], [place], [place]  [address] Phone: [phone]; Fax: [phone]

## 2019-07-08 ENCOUNTER — Ambulatory Visit: Payer: Medicare Other | Attending: Family Medicine | Admitting: Physical Therapy

## 2019-07-08 ENCOUNTER — Other Ambulatory Visit: Payer: Self-pay

## 2019-07-08 ENCOUNTER — Encounter: Payer: Self-pay | Admitting: Physical Therapy

## 2019-07-08 DIAGNOSIS — R296 Repeated falls: Secondary | ICD-10-CM | POA: Insufficient documentation

## 2019-07-08 DIAGNOSIS — R2689 Other abnormalities of gait and mobility: Secondary | ICD-10-CM | POA: Diagnosis present

## 2019-07-08 DIAGNOSIS — R2681 Unsteadiness on feet: Secondary | ICD-10-CM

## 2019-07-08 DIAGNOSIS — G8929 Other chronic pain: Secondary | ICD-10-CM

## 2019-07-08 DIAGNOSIS — M545 Low back pain, unspecified: Secondary | ICD-10-CM

## 2019-07-08 DIAGNOSIS — M6281 Muscle weakness (generalized): Secondary | ICD-10-CM | POA: Insufficient documentation

## 2019-07-08 NOTE — Patient Instructions (Signed)
Access Code: [email or web address]  URL: [email or web address]  Date: 07/08/2019  Prepared by: Nita Sells   Exercises  Hooklying Single Knee to Chest Stretch - 4 reps - 20 hold - 1x daily - 6x weekly  Supine Double Knee to Chest - 2 sets - 20 second hold - 1x daily - 7x weekly  Supine Lower Trunk Rotation - 5 reps - 2 sets - 2x daily - 6x weekly  Supine Hamstring Stretch - 10 reps - 2 sets - 1x daily - 7x weekly  Beginner Bridge - 5 reps - 2 sets - 1x daily - 7x weekly  Seated Hamstring Stretch with Strap - 3 reps - 20 seconds hold - 1x daily - 7x weekly  Sit to Stand with Armchair - 5 reps - 2 sets - 2x daily - 7x weekly

## 2019-07-08 NOTE — Therapy (Signed)
[facility] [address] [place], [place], [address] Phone: [phone]   Fax:  [phone]  Physical Therapy Treatment  Patient Details  Name: Troy Combs MRN: [id number] Date of Birth: [birth year] Referring Provider (PT): Antony Contras MD    Encounter Date: 07/08/2019  PT End of Session - 07/08/19 1548    Visit Number  18    Number of Visits  23    Date for PT Re-Evaluation  08/08/19    Authorization Type  [employer] Medicare - 10th visit PN    PT Start Time  [id number]    PT Stop Time  1531    PT Time Calculation (min)  46 min    Activity Tolerance  Patient tolerated treatment well    Behavior During Therapy  [facility] for tasks assessed/performed       Past Medical History:  Diagnosis Date  . Allergic rhinitis 09/20/2014  . Anemia, unspecified   . Arthritis   . Bladder neck obstruction   . Cataracts, bilateral   . Coronary atherosclerosis of unspecified type of vessel, native or graft   . Diminished hearing, bilateral   . Dysthymic disorder   . GERD (gastroesophageal reflux disease)   . Heart murmur    as a child  . Impaired glucose tolerance 12/02/2012  . Impotence of organic origin   . Lumbar radiculopathy   . Malignant melanoma of skin of upper limb, including shoulder ([place])   . Old myocardial infarction   . Personal history of malignant melanoma of skin   . Personal history of other diseases of digestive system   . Thoracic or lumbosacral neuritis or radiculitis, unspecified   . Unspecified essential hypertension     Past Surgical History:  Procedure Laterality Date  . ARTHRODESIS  07/06/2002   of left long finger distal interphalangeal joint with Kirschner wire fixation X 3  . ATRIAL ABLATION SURGERY  05/04/2001   Dr. [name]  . CARDIAC CATHETERIZATION  09/05/2008   Revealing 4 of 4 patent grafts with native multivessel coronary artery disease, EF  of 60% without regional wall motion abnormalities.  Marland Kitchen  CATARACT EXTRACTION, BILATERAL    . CHOLECYSTECTOMY    . COLONOSCOPY    . CORONARY ANGIOPLASTY  1993  . CORONARY ARTERY BYPASS GRAFT  10/17/2000   [name]. [name], [place]     AFTER LIVER TRAUMA AND HAND SURGERY  . EYE SURGERY    . HERNIA REPAIR     BILATERAL  . LEFT HEART CATH AND CORS/GRAFTS ANGIOGRAPHY N/A 04/03/2017   Procedure: LEFT HEART CATH AND CORS/GRAFTS ANGIOGRAPHY;  Surgeon: Leonie Man, MD;  Location: [facility];  Service: Cardiovascular;  Laterality: N/A;  . LUMBAR LAMINECTOMY/DECOMPRESSION MICRODISCECTOMY N/A 02/02/2016   Procedure: MICRODISCECTOMY LUMBAR FOUR- LUMBAR FIVE;  Surgeon: Ashok Pall, MD;  Location: [facility];  Service: Neurosurgery;  Laterality: N/A;  MICRODISCECTOMY L4-L5  . LUMBAR LAMINECTOMY/DECOMPRESSION MICRODISCECTOMY Right 08/02/2016   Procedure: MICRODISCECTOMY LUMBAR FOUR- LUMBAR FIVE RIGHT;  Surgeon: Ashok Pall, MD;  Location: [facility];  Service: Neurosurgery;  Laterality: Right;  . punctured eardrum      to relieve blood build up   for ear infection  . TONSILLECTOMY      There were no vitals filed for this visit.  Subjective Assessment - 07/08/19 1453    Subjective  Denies any falls or changes.  States that he feels like the upright walker helps significantly with his pain.  Has  not been out of the house in weeks.  Has not done the HEP since last visit stating "I just really havent thought about doing them.".    Pertinent History  monocular vertical diplopia, HTN, CAD s/p MI, CABG, neurogenic orthostatic hypotension, neuropathy, cervicogenic HA    Limitations  Standing;Walking    How long can you stand comfortably?  10 minutes    Patient Stated Goals  maintain high QOL, get back to doing household chores without pain, get out and walk, to feel safe walking/with balance in order for him and his wife to get a dog    Currently in Pain?  Yes    Pain Score  6     Pain Location  Back    Pain Orientation  Lower    Pain  Descriptors / Indicators  Sharp    Pain Type  Chronic pain    Pain Radiating Towards  n/a    Pain Onset  More than a month ago    Pain Frequency  Intermittent    Aggravating Factors   standing, cooking    Pain Relieving Factors  heat, medicine    Multiple Pain Sites  No    Pain Onset  More than a month ago        [facility] Adult PT Treatment/Exercise - 07/08/19 0001      Transfers   Transfers  Sit to Stand;Stand to Sit    Sit to Stand  6: Modified independent (Device/Increase time);With upper extremity assist;From chair/3-in-1    Stand to Sit  6: Modified independent (Device/Increase time);With upper extremity assist;To bed    Number of Reps  Other reps (comment)   5 reps   Transfer Cueing  cues for forward weight shift.  Pt unable to stand from mat without assist.  Provided as HEP.      Ambulation/Gait   Gait Comments  Gait into/out of clinic.  Pt using single walking stick today.  Pt had LOB in lobby after standing up from chair.  Pt recovered without assist.      Posture/Postural Control   Posture/Postural Control  Postural limitations    Postural Limitations  Forward head;Rounded Shoulders      Self-Care   Self-Care  Other Self-Care Comments    Other Self-Care Comments   Pt reports difficulty with bed mobility in his bed at home and has not wanted to get back into the bed to perform HEP.  Asked if pt had another bed that he could get into easier to perform HEP before cooking (as discussed in previous sessions) and pt states that his wife uses it as an office and the bed is covered in files.  Continued to reinforce the importance of HEP and mobility/activity.      Exercises   Exercises  Lumbar      Lumbar Exercises: Stretches   Active Hamstring Stretch  Right;Left;3 reps;20 seconds;Limitations   needs cues to keep knee extended;in supine   Single Knee to Chest Stretch  Right;Left;3 reps;20 seconds    Double Knee to Chest Stretch  2 reps;20 seconds    Lower Trunk Rotation  5  reps    Other Lumbar Stretch Exercise  Also performed seated hamstring stretch with strap bil LE's x 20 sec x 2 reps-added to HEP      Lumbar Exercises: Standing   Other Standing Lumbar Exercises  Standing against wall for upright posture x 30 seconds      Lumbar Exercises: Supine   Bridge  Non-compliant;10  reps;3 seconds    Bridge Limitations  decreased activation on the R             PT Education - 07/08/19 1547    Education Details  Importance of HEP, need to find exercises/activity that pt will be compliant with, importance of continued mobility for flexibility and pain relief    Person(s) Educated  Patient    Methods  Explanation;Demonstration;Handout    Comprehension  Verbalized understanding       PT Short Term Goals - 05/25/19 1439      PT SHORT TERM GOAL #1   Title  = LTG        PT Long Term Goals - 06/24/19 1638      PT LONG TERM GOAL #1   Title  Pt reports ability to ambulate >/= 30 minutes with LRAD without increase in pain. (all LTG target date: 08/08/2019)    Baseline  Has not been able to walk due to weather, has been walking inside    Time  6    Period  Weeks    Status  Revised    Target Date  08/08/19      PT LONG TERM GOAL #2   Title  He will improve [place] scale of kinesiophobia to less than 28 to show less fear about pain and disability.    Baseline  increased to 40 at re-certification despite pt reporting less pain and greater function    Time  6    Period  Weeks    Status  Revised    Target Date  08/08/19      PT LONG TERM GOAL #3   Title  Pt will report being able to stand >20 minutes at home to perform household chores with pain no >3/10    Baseline  has regressed to standing 5 minutes with pain >7/10    Time  6    Period  Weeks    Status  Revised    Target Date  08/08/19      PT LONG TERM GOAL #4   Title  Will improve 5xSTS to 13 seconds or less    Baseline  05/17/19: 17  > 15 seconds    Time  6    Period  Weeks    Status   On-going    Target Date  08/08/19      PT LONG TERM GOAL #5   Title  Will improve BERG to at least 46 to show improved balance.    Baseline  05/17/19: 44/56    Time  6    Period  Weeks    Status  On-going    Target Date  08/08/19      PT LONG TERM GOAL #6   Title  Pt will report compliance with HEP 4/7 days a week   continues to be limited, has improved since eval   Time  6    Period  Weeks    Status  New    Target Date  08/08/19      PT LONG TERM GOAL #8   Title  --   score decreased to 40 on reassessment on 05/20/19           Plan - 07/08/19 1549    Clinical Impression Statement  Pt continues to report back pain at 6-7/10 that is intermittent but has not been performing HEP consistently nor walking.  Reports not going out of the house for approx 3 weeks.  Skilled session  focused on stretching and strengthening along with education again on importance of HEP and activity.  Continue PT per POC.    Personal Factors and Comorbidities  Age;Comorbidity 3+;Behavior Pattern;Fitness;Past/Current Experience;Time since onset of injury/illness/exacerbation    Comorbidities  monocular vertical diplopia, HTN, CAD s/p MI, CABG, neurogenic orthostatic hypotension, neuropathy, cervicogenic HA, microdiscectomy (2017, 2018)    Examination-Activity Limitations  Locomotion Level;Stairs;Stand    Examination-Participation Restrictions  Cleaning;Community Activity;Laundry;Yard Work    Water engineer  Good   pt stated not wanting to do exercises at home   PT Frequency  1x / week    PT Duration  6 weeks    PT Treatment/Interventions  ADLs/Self Care Home Management;Gait training;Stair training;Therapeutic activities;Therapeutic exercise;Balance training;Patient/family education;Neuromuscular re-education;Manual techniques;Electrical Stimulation;Moist Heat;DME Instruction;Functional mobility training;Passive range of motion;Dry needling;Cryotherapy    PT Next Visit Plan  has he been performing HEP  before cooking dinner - is it helping?  May need to add more seated and standing exercises as pt reports not wanting to get back into the bed once he is up for the day.  Hamstring stretches, sit > stand training.  Multi sensory balance training, especially compliant surfaces    PT Home Exercise Plan  Access Code: [email or web address]    Consulted and Agree with Plan of Care  Patient       Patient will benefit from skilled therapeutic intervention in order to improve the following deficits and impairments:  Abnormal gait, Decreased range of motion, Difficulty walking, Decreased activity tolerance, Decreased endurance, Pain, Decreased balance, Decreased strength, Decreased mobility, Impaired flexibility, Impaired sensation  Visit Diagnosis: Other abnormalities of gait and mobility  Unsteadiness on feet  Muscle weakness (generalized)  Chronic midline low back pain without sciatica  Repeated falls     Problem List Patient Active Problem List   Diagnosis Date Noted  . Personal history of malignant melanoma of skin   . Lumbar radiculopathy   . Heart murmur   . GERD (gastroesophageal reflux disease)   . Diminished hearing, bilateral   . Cataracts, bilateral   . Bladder neck obstruction   . Arthritis   . Constipation 12/12/2016  . Nausea 12/12/2016  . Coronary artery disease involving native coronary artery of native heart without angina pectoris 11/22/2016  . Dyslipidemia 11/22/2016  . History of coronary artery bypass surgery 11/22/2016  . Depression with anxiety   . Orthostasis 09/20/2016  . HNP (herniated nucleus pulposus), lumbar 02/02/2016  . Spinal stenosis of lumbar region 01/24/2016  . Sciatica, right side 01/24/2016  . Prediabetes 01/24/2016  . Otitis, externa, infective 01/24/2016  . Left lumbar radiculopathy 01/15/2016  . PVC's (premature ventricular contractions) 05/24/2015  . Peripheral polyneuropathy 03/06/2015  . BPPV (benign paroxysmal positional vertigo) 03/06/2015  .  Headache 01/18/2015  . Vertigo 01/18/2015  . Allergic rhinitis 09/20/2014  . Dyspnea on exertion 08/10/2014  . Severe dizziness 08/10/2014  . Acute diverticulitis 05/18/2014  . Diarrhea 05/18/2014  . LLQ pain 05/13/2014  . Chronic meniscal tear of knee 09/22/2013  . Primary localized osteoarthrosis, lower leg 09/22/2013  . Neck pain, bilateral 12/18/2012  . Headache(784.0) 12/18/2012  . Binocular visual disturbance 12/18/2012  . Epiretinal membrane 12/17/2012  . Status post intraocular lens implant 12/17/2012  . Preventative health care 12/02/2012  . Low back pain 12/02/2012  . Impaired glucose tolerance 12/02/2012  . Palpitations 10/09/2010  . B12 deficiency 08/30/2010  . Hearing loss 08/30/2010  . Tinnitus 08/30/2010  . Neurogenic orthostatic hypotension ([place]) 06/28/2009  . HYPERLIPIDEMIA TYPE IIB / III  05/15/2009  . CHEST PAIN UNSPECIFIED 11/23/2008  . Hypotension, unspecified 10/12/2008  . DIZZINESS 10/12/2008  . Shortness of breath 09/14/2008  . UNSPECIFIED ANEMIA 09/13/2008  . ANXIETY DEPRESSION 09/13/2008  . Old myocardial infarction 09/13/2008  . GASTROESOPHAGEAL REFLUX DISEASE, HX OF 09/13/2008  . LUMBAR RADICULOPATHY, LEFT 08/23/2008  . MELANOMA, SHOULDER 11/27/2006  . Essential hypertension 11/27/2006  . Coronary atherosclerosis 11/27/2006  . BPH associated with nocturia 11/27/2006  . ERECTILE DYSFUNCTION, ORGANIC 11/27/2006    Narda Bonds, PTA [place] 07/08/19 3:54 PM Phone: [phone] Fax: [facility] [address] [place], [place], [address] Phone: [phone]   Fax:  [phone]  Name: ROMNEY FAHEY MRN: [id number] Date of Birth: [birth year]

## 2019-07-13 ENCOUNTER — Ambulatory Visit: Payer: Medicare Other | Admitting: Physician Assistant

## 2019-07-16 ENCOUNTER — Ambulatory Visit: Payer: Medicare Other | Admitting: Physical Therapy

## 2019-07-21 ENCOUNTER — Ambulatory Visit: Payer: Medicare Other | Attending: Internal Medicine

## 2019-07-21 DIAGNOSIS — Z23 Encounter for immunization: Secondary | ICD-10-CM

## 2019-07-21 NOTE — Progress Notes (Signed)
   Covid-19 Vaccination Clinic  Name:  Troy Combs    MRN: [id number] DOB: March 28, 1932  07/21/2019  Troy Combs was observed post Covid-19 immunization for 15 minutes without incident. He was provided with Vaccine Information Sheet and instruction to access the V-Safe system.   Troy Combs was instructed to call 911 with any severe reactions post vaccine: Marland Kitchen Difficulty breathing  . Swelling of face and throat  . A fast heartbeat  . A bad rash all over body  . Dizziness and weakness   Immunizations Administered    Name Date Dose VIS Date Route   Pfizer COVID-19 Vaccine 07/21/2019  9:39 AM 0.3 mL 04/09/2019 Intramuscular   Manufacturer: [place]   Lot: [id number]   [place]: [id number]

## 2019-07-23 ENCOUNTER — Ambulatory Visit: Payer: Medicare Other | Admitting: Physical Therapy

## 2019-07-29 ENCOUNTER — Ambulatory Visit: Payer: Medicare Other | Attending: Family Medicine | Admitting: Physical Therapy

## 2019-08-03 ENCOUNTER — Encounter: Payer: Self-pay | Admitting: Physical Therapy

## 2019-08-03 NOTE — Therapy (Signed)
[facility] [address] [place], [place], [address] Phone: [phone]   Fax:  [phone]  Patient Details  Name: Troy Combs MRN: [id number] Date of Birth: 30-May-1931 Referring Provider:  No ref. provider found  Encounter Date: 08/03/2019  PHYSICAL THERAPY DISCHARGE SUMMARY  Visits from Start of Care: 18 out of 23 planned  Current functional level related to goals / functional outcomes: Unable to fully assess; pt was a no show for multiple sessions and did not return for final therapy sessions.   Remaining deficits: Impaired LE strength, back pain, impaired balance and impaired gait   Education / Equipment: HEP, Up Walker  Plan: Patient agrees to discharge.  Patient goals were not met. Patient is being discharged due to not returning since the last visit.  ?????     Rico Junker, PT, DPT 08/03/19    12:35 PM    [facility] [address] [place], [place], [address] Phone: [phone]   Fax:  [phone]

## 2019-08-04 NOTE — Progress Notes (Signed)
Cardiology Office Note    Date:  08/11/2019   ID:  Troy Combs, DOB [birth year], MRN [id number]  PCP:  Antony Contras, MD  Cardiologist: Ena Dawley, MD EPS: Virl Axe, MD  Chief Complaint  Patient presents with  . Follow-up    History of Present Illness:  Troy Combs is a 86 y.o. male with a history of CAD s/p CABG x4V (2002), HTN, atrial flutter, known orthostatic hypotension, symptomatic PVCs managed with Toprol but limited with hypotension later switched to Coreg.  Dyspnea on exertion 2016 exercise Myoview stress test low risk due to chronotropic incompetence and it was switched to [place] 2D echo 2016 LVEF 65 to 70% grade 1 DD mild bileaflet MVP, 48 hour Holter 2017 infrequent PVCs and PACs, low risk stress test 2018.  Cardiac cath 03/2017 patent grafts not felt to be the cause of his dyspnea on exertion.  Saw [name] 08/2016 who felt orthostatic hypotension was likely related to longstanding systolic hypertension.   He is now followed for his autonomic dysfunction neurology by [name] and is on Northera, midodrine and pyridostigmine.  Patient saw Dr. [name] 12/2018 and was having fluctuating BP's and told to take an extra hydralazine as needed.  Last seen by Dr. [name] 12/2018 which time he had some atypical chest pain felt secondary to GERD.  Doing hydralazine 25 mg as needed for blood pressure over 99991111 systolic.  Patient comes in accompanied by his daughter. He complains of nausea with some vomiting in am for about 2 weeks. Also a lot of upper and lower GI gas. No melena, pain, or symptoms any other time of day.Drinks 1/2 cup coffee daily and 1 ounce liquor at bed. Taking meds before bed as well.Under a lot of stress at home with wife breaking her wrist. Chronic dyspnea on exertion unchanged. BP well controlled at home and not using extra hydralazine. No chest pain, or edema.    Past Medical History:  Diagnosis Date  . Allergic rhinitis 09/20/2014  .  Anemia, unspecified   . Arthritis   . Bladder neck obstruction   . Cataracts, bilateral   . Coronary atherosclerosis of unspecified type of vessel, native or graft   . Diminished hearing, bilateral   . Dysthymic disorder   . GERD (gastroesophageal reflux disease)   . Heart murmur    as a child  . Impaired glucose tolerance 12/02/2012  . Impotence of organic origin   . Lumbar radiculopathy   . Malignant melanoma of skin of upper limb, including shoulder ([place])   . Old myocardial infarction   . Personal history of malignant melanoma of skin   . Personal history of other diseases of digestive system   . Thoracic or lumbosacral neuritis or radiculitis, unspecified   . Unspecified essential hypertension     Past Surgical History:  Procedure Laterality Date  . ARTHRODESIS  07/06/2002   of left long finger distal interphalangeal joint with Kirschner wire fixation X 3  . ATRIAL ABLATION SURGERY  05/04/2001   Dr. [name]  . CARDIAC CATHETERIZATION  09/05/2008   Revealing 4 of 4 patent grafts with native multivessel coronary artery disease, EF  of 60% without regional wall motion abnormalities.  Marland Kitchen CATARACT EXTRACTION, BILATERAL    . CHOLECYSTECTOMY    . COLONOSCOPY    . CORONARY ANGIOPLASTY  1993  . CORONARY ARTERY BYPASS GRAFT  10/17/2000   [name]. [name], MD  . EXPLORATORY LAPAROTOMY     AFTER LIVER TRAUMA AND  HAND SURGERY  . EYE SURGERY    . HERNIA REPAIR     BILATERAL  . LEFT HEART CATH AND CORS/GRAFTS ANGIOGRAPHY N/A 04/03/2017   Procedure: LEFT HEART CATH AND CORS/GRAFTS ANGIOGRAPHY;  Surgeon: Leonie Man, MD;  Location: [facility];  Service: Cardiovascular;  Laterality: N/A;  . LUMBAR LAMINECTOMY/DECOMPRESSION MICRODISCECTOMY N/A 02/02/2016   Procedure: MICRODISCECTOMY LUMBAR FOUR- LUMBAR FIVE;  Surgeon: Ashok Pall, MD;  Location: [facility];  Service: Neurosurgery;  Laterality: N/A;  MICRODISCECTOMY L4-L5  . LUMBAR LAMINECTOMY/DECOMPRESSION MICRODISCECTOMY  Right 08/02/2016   Procedure: MICRODISCECTOMY LUMBAR FOUR- LUMBAR FIVE RIGHT;  Surgeon: Ashok Pall, MD;  Location: [facility];  Service: Neurosurgery;  Laterality: Right;  . punctured eardrum      to relieve blood build up   for ear infection  . TONSILLECTOMY      Current Medications: Current Meds  Medication Sig  . Aspirin Buf,CaCarb-MgCarb-MgO, 81 MG TABS Take 81 mg by mouth daily.  Marland Kitchen aspirin EC 81 MG tablet Take 81 mg by mouth at bedtime.   . finasteride (PROSCAR) 5 MG tablet Take 5 mg by mouth at bedtime.  . gabapentin (NEURONTIN) 300 MG capsule Take 300 mg by mouth at bedtime.  . hydrALAZINE (APRESOLINE) 25 MG tablet Take 1 tablet (25 mg total) by mouth 2 (two) times daily.  Marland Kitchen lisinopril (ZESTRIL) 5 MG tablet Take 1 tablet (5 mg total) by mouth daily.  . metoprolol succinate (TOPROL-XL) 25 MG 24 hr tablet Take 1 tablet (25 mg total) by mouth daily.  . nitroGLYCERIN (NITROSTAT) 0.4 MG SL tablet Place 1 tablet (0.4 mg total) under the tongue every 5 (five) minutes as needed for chest pain (x 3 doses). Reported on 06/15/2015  . rosuvastatin (CRESTOR) 10 MG tablet Take 1 tablet (10 mg total) by mouth daily.  . sertraline (ZOLOFT) 100 MG tablet Take 100 mg by mouth daily.  . tamsulosin (FLOMAX) 0.4 MG CAPS capsule Take 1 capsule (0.4 mg total) by mouth daily after supper.  Marland Kitchen tiZANidine (ZANAFLEX) 2 MG tablet Take 2 mg by mouth at bedtime.  . [DISCONTINUED] hydrALAZINE (APRESOLINE) 25 MG tablet Take 25 mg by mouth 2 (two) times daily.  . [DISCONTINUED] lisinopril (ZESTRIL) 5 MG tablet Take 5 mg by mouth daily.  . [DISCONTINUED] metoprolol succinate (TOPROL-XL) 25 MG 24 hr tablet Take 1 tablet (25 mg total) by mouth daily. Please make overdue appt with Dr. [name] before anymore refills. 1st attempt  . [DISCONTINUED] rosuvastatin (CRESTOR) 10 MG tablet TAKE 1 TABLET BY MOUTH EVERY DAY     Allergies:   Iohexol, Ciprofloxacin, and Other   Social History   Socioeconomic History  . Marital  status: Married    Spouse name: Not on file  . Number of children: 5  . Years of education: Not on file  . Highest education level: Not on file  Occupational History  . Not on file  Tobacco Use  . Smoking status: Former Smoker    Packs/day: 1.00    Years: 3.00    Pack years: 3.00    Types: Cigarettes    Quit date: 09/21/1955    Years since quitting: 63.9  . Smokeless tobacco: Never Used  Substance and Sexual Activity  . Alcohol use: Yes    Alcohol/week: 0.0 standard drinks    Comment: one or two ounces a week  . Drug use: No  . Sexual activity: Not on file  Other Topics Concern  . Not on file  Social History Narrative  Lives with wife in a one story home.     Has 5 children.  (2 are deceased now) Retired Freight forwarder for [employer].     Education: 2 years of college.    Social Determinants of Health   Financial Resource Strain:   . Difficulty of Paying Living Expenses:   Food Insecurity:   . Worried About Charity fundraiser in the Last Year:   . Arboriculturist in the Last Year:   Transportation Needs:   . Film/video editor (Medical):   Marland Kitchen Lack of Transportation (Non-Medical):   Physical Activity:   . Days of Exercise per Week:   . Minutes of Exercise per Session:   Stress:   . Feeling of Stress :   Social Connections:   . Frequency of Communication with Friends and Family:   . Frequency of Social Gatherings with Friends and Family:   . Attends Religious Services:   . Active Member of Clubs or Organizations:   . Attends Archivist Meetings:   Marland Kitchen Marital Status:      Family History:  The patient's family history includes Heart disease in his father.   ROS:   Please see the history of present illness.    ROS All other systems reviewed and are negative.   PHYSICAL EXAM:   VS:  BP 140/88   Pulse 75   Ht 5' 9.5" (1.765 m)   Wt 172 lb 1.9 oz (78.1 kg)   SpO2 93%   BMI 25.05 kg/m   Physical Exam  GEN: Thin, in no acute distress  Neck: no JVD, carotid  bruits, or masses Cardiac:RRR; no murmurs, rubs, or gallops  Respiratory:  clear to auscultation bilaterally, normal work of breathing GI: soft, nontender, nondistended, + BS Ext: without cyanosis, clubbing, or edema, Good distal pulses bilaterally MS: no deformity or atrophy  Skin: warm and dry, no rash Neuro:  Alert and Oriented x 3, Strength and sensation are intact Psych: euthymic mood, full affect  Wt Readings from Last 3 Encounters:  08/11/19 172 lb 1.9 oz (78.1 kg)  01/14/19 174 lb 3.2 oz (79 kg)  11/06/18 175 lb (79.4 kg)      Studies/Labs Reviewed:   EKG:  EKG is not ordered today.    Recent Labs: 01/14/2019: ALT 34; BUN 18; Creatinine, Ser 0.98; Hemoglobin 17.2; Platelets 200; Potassium 4.9; Sodium 138; TSH 2.380   Lipid Panel    Component Value Date/Time   CHOL 110 01/24/2016 0955   TRIG 133.0 01/24/2016 0955   TRIG 83 03/18/2006 0839   HDL 49.70 01/24/2016 0955   CHOLHDL 2 01/24/2016 0955   VLDL 26.6 01/24/2016 0955   LDLCALC 34 01/24/2016 0955    Additional studies/ records that were reviewed today include:   Mid LAD lesion is 99% stenosed. Ost 2nd Diag to 2nd Diag lesion is 40% stenosed.  Widely patent LIMA-LAD. The distal LAD wraps the apex.  Ost 1st Mrg (OM1) lesion is 100% stenosed. Ost 2nd Mrg (OM2) lesion is 100% stenosed.  Widely patent sequential SVG-OM1-OM 2. Very small subbranch of OM1 has a 80-90% stenosis of the brain cannot be shown on the diagram.  Ost RCA to Dist RCA lesion is 100% stenosed. Was previously 95% proximal stenosis.  Widely patent SVG-RPDA with retrograde filling of the PL system.  The left ventricular systolic function is normal. The left ventricular ejection fraction is 55-65% by visual estimate.  LV end diastolic pressure is mildly elevated.   Severe native  vessel disease with occluded RCA, OM1, OM 2 essentially subtotal occlusion of the LAD.  All of these major vessels are grafted with widely patent grafts and stable  distal flow. Normal LV function with normal mildly elevated LVEDP in the setting of systemic hypertension from supine hypertension.   Likely the patient's exertional dyspnea is not related to ischemic coronary artery disease.   Plan: The patient will return to short stay for TR band removal.  He will be discharged home following bed rest.   He will follow-up with Dr. [name] as scheduled.       Glenetta Hew, M.D., M.S.     ASSESSMENT:    1. Nausea and vomiting, intractability of vomiting not specified, unspecified vomiting type   2. Coronary artery disease involving coronary bypass graft of native heart without angina pectoris   3. Gas pain   4. Essential hypertension   5. Orthostatic hypotension   6. Hyperlipidemia, unspecified hyperlipidemia type      PLAN:  In order of problems listed above:  Nausea, vomiting, excessive gas for over 2 weeks. I've asked him to take his evening meds after dinner, take some tums and refer to GI. Will check CBC and CMET today.  CAD status post CABG times 07/2000-no angina, continue ASA, crestor, toprol  Essential hypertension  Can take an as needed hydralazine for systolic blood pressure greater than 180 but hasn't had to use extra.  Orthostatic hypotension with autonomic dysfunction [facility] recent symptoms  Hyperlipidemia LDL at goal on crestor, managed by PCP     Medication Adjustments/Labs and Tests Ordered: Current medicines are reviewed at length with the patient today.  Concerns regarding medicines are outlined above.  Medication changes, Labs and Tests ordered today are listed in the Patient Instructions below. Patient Instructions  Medication Instructions:  Your physician recommends that you continue on your current medications as directed. Please refer to the Current Medication list given to you today.  BE SURE TO TAKE YOUR MEDICINES WITH FOOD  *If you need a refill on your cardiac medications  before your next appointment, please call your pharmacy*   Lab Work: TODAY: CBC, BMET  If you have labs (blood work) drawn today and your tests are completely normal, you will receive your results only by: Marland Kitchen MyChart Message (if you have MyChart) OR . A paper copy in the mail If you have any lab test that is abnormal or we need to change your treatment, we will call you to review the results.   Testing/Procedures: None ordered   Follow-Up:  You have been referred to Gastroenterology   At [facility], you and your health needs are our priority.  As part of our continuing mission to provide you with exceptional heart care, we have created designated Provider Care Teams.  These Care Teams include your primary Cardiologist (physician) and Advanced Practice Providers (APPs -  Physician Assistants and Nurse Practitioners) who all work together to provide you with the care you need, when you need it.  We recommend signing up for the patient portal called "MyChart".  Sign up information is provided on this After Visit Summary.  MyChart is used to connect with patients for Virtual Visits (Telemedicine).  Patients are able to view lab/test results, encounter notes, upcoming appointments, etc.  Non-urgent messages can be sent to your provider as well.   To learn more about what you can do with MyChart, go to [email or web address].    Your next appointment:  6 month(s)  The format for your next appointment:   In Person  Provider:   You may see Ena Dawley, MD or one of the following Advanced Practice Providers on your designated Care Team:    Melina Copa, PA-C  Ermalinda Barrios, PA-C    Other Instructions      Signed, Ermalinda Barrios, PA-C  08/11/2019 9:42 AM    [facility] [place], [place], [place]  [address] Phone: [phone]; Fax: [phone]

## 2019-08-11 ENCOUNTER — Other Ambulatory Visit: Payer: Self-pay

## 2019-08-11 ENCOUNTER — Encounter: Payer: Self-pay | Admitting: Physician Assistant

## 2019-08-11 ENCOUNTER — Ambulatory Visit (INDEPENDENT_AMBULATORY_CARE_PROVIDER_SITE_OTHER): Payer: Medicare Other | Admitting: Physician Assistant

## 2019-08-11 VITALS — BP 140/88 | HR 75 | Ht 69.5 in | Wt 172.1 lb

## 2019-08-11 DIAGNOSIS — I10 Essential (primary) hypertension: Secondary | ICD-10-CM | POA: Diagnosis not present

## 2019-08-11 DIAGNOSIS — R112 Nausea with vomiting, unspecified: Secondary | ICD-10-CM | POA: Diagnosis not present

## 2019-08-11 DIAGNOSIS — I951 Orthostatic hypotension: Secondary | ICD-10-CM

## 2019-08-11 DIAGNOSIS — R141 Gas pain: Secondary | ICD-10-CM

## 2019-08-11 DIAGNOSIS — E785 Hyperlipidemia, unspecified: Secondary | ICD-10-CM

## 2019-08-11 DIAGNOSIS — I25810 Atherosclerosis of coronary artery bypass graft(s) without angina pectoris: Secondary | ICD-10-CM | POA: Diagnosis not present

## 2019-08-11 LAB — CBC
Hematocrit: 43.1 % (ref 37.5–51.0)
Hemoglobin: 15.2 g/dL (ref 13.0–17.7)
MCH: 30.2 pg (ref 26.6–33.0)
MCHC: 35.3 g/dL (ref 31.5–35.7)
MCV: 86 fL (ref 79–97)
Platelets: 153 10*3/uL (ref 150–450)
RBC: 5.03 x10E6/uL (ref 4.14–5.80)
RDW: 13.2 % (ref 11.6–15.4)
WBC: 10.5 10*3/uL (ref 3.4–10.8)

## 2019-08-11 LAB — COMPREHENSIVE METABOLIC PANEL
ALT: 26 IU/L (ref 0–44)
AST: 24 IU/L (ref 0–40)
Albumin/Globulin Ratio: 2.5 — ABNORMAL HIGH (ref 1.2–2.2)
Albumin: 4.5 g/dL (ref 3.6–4.6)
Alkaline Phosphatase: 75 IU/L (ref 39–117)
BUN/Creatinine Ratio: 25 — ABNORMAL HIGH (ref 10–24)
BUN: 20 mg/dL (ref 8–27)
Bilirubin Total: 0.5 mg/dL (ref 0.0–1.2)
CO2: 22 mmol/L (ref 20–29)
Calcium: 9.5 mg/dL (ref 8.6–10.2)
Chloride: 105 mmol/L (ref 96–106)
Creatinine, Ser: 0.79 mg/dL (ref 0.76–1.27)
GFR calc Af Amer: 95 mL/min/{1.73_m2} (ref >59)
GFR calc non Af Amer: 82 mL/min/{1.73_m2} (ref >59)
Globulin, Total: 1.8 g/dL (ref 1.5–4.5)
Glucose: 142 mg/dL — ABNORMAL HIGH (ref 65–99)
Potassium: 3.9 mmol/L (ref 3.5–5.2)
Sodium: 141 mmol/L (ref 134–144)
Total Protein: 6.3 g/dL (ref 6.0–8.5)

## 2019-08-11 MED ORDER — METOPROLOL SUCCINATE ER 25 MG PO TB24: 25.0000 mg | ORAL_TABLET | Freq: Every day | ORAL | 3 refills | Status: DC

## 2019-08-11 MED ORDER — LISINOPRIL 5 MG PO TABS: 5.0000 mg | ORAL_TABLET | Freq: Every day | ORAL | 3 refills | Status: DC

## 2019-08-11 MED ORDER — HYDRALAZINE HCL 25 MG PO TABS: 25.0000 mg | ORAL_TABLET | Freq: Two times a day (BID) | ORAL | 3 refills | Status: DC

## 2019-08-11 MED ORDER — ROSUVASTATIN CALCIUM 10 MG PO TABS: 10.0000 mg | ORAL_TABLET | Freq: Every day | ORAL | 3 refills | Status: DC

## 2019-08-11 NOTE — Patient Instructions (Addendum)
Medication Instructions:  Your physician recommends that you continue on your current medications as directed. Please refer to the Current Medication list given to you today.  BE SURE TO TAKE YOUR MEDICINES WITH FOOD  *If you need a refill on your cardiac medications before your next appointment, please call your pharmacy*   Lab Work: TODAY: CBC, BMET  If you have labs (blood work) drawn today and your tests are completely normal, you will receive your results only by: Marland Kitchen MyChart Message (if you have MyChart) OR . A paper copy in the mail If you have any lab test that is abnormal or we need to change your treatment, we will call you to review the results.   Testing/Procedures: None ordered   Follow-Up:  You have been referred to Gastroenterology   At [facility], you and your health needs are our priority.  As part of our continuing mission to provide you with exceptional heart care, we have created designated Provider Care Teams.  These Care Teams include your primary Cardiologist (physician) and Advanced Practice Providers (APPs -  Physician Assistants and Nurse Practitioners) who all work together to provide you with the care you need, when you need it.  We recommend signing up for the patient portal called "MyChart".  Sign up information is provided on this After Visit Summary.  MyChart is used to connect with patients for Virtual Visits (Telemedicine).  Patients are able to view lab/test results, encounter notes, upcoming appointments, etc.  Non-urgent messages can be sent to your provider as well.   To learn more about what you can do with MyChart, go to [email or web address].    Your next appointment:   6 month(s)  The format for your next appointment:   In Person  Provider:   You may see Ena Dawley, MD or one of the following Advanced Practice Providers on your designated Care Team:    Melina Copa, PA-C  Ermalinda Barrios, PA-C    Other Instructions

## 2019-08-12 ENCOUNTER — Emergency Department (HOSPITAL_COMMUNITY): Payer: Medicare Other

## 2019-08-12 ENCOUNTER — Other Ambulatory Visit: Payer: Self-pay

## 2019-08-12 ENCOUNTER — Encounter (HOSPITAL_COMMUNITY): Payer: Self-pay

## 2019-08-12 ENCOUNTER — Inpatient Hospital Stay (HOSPITAL_COMMUNITY)
Admission: EM | Admit: 2019-08-12 | Discharge: 2019-08-14 | DRG: 149 | Disposition: A | Discharge status: Home / Self Care | Payer: Medicare Other | Attending: Internal Medicine | Admitting: Internal Medicine

## 2019-08-12 DIAGNOSIS — I2510 Atherosclerotic heart disease of native coronary artery without angina pectoris: Secondary | ICD-10-CM | POA: Diagnosis present

## 2019-08-12 DIAGNOSIS — K219 Gastro-esophageal reflux disease without esophagitis: Secondary | ICD-10-CM | POA: Diagnosis present

## 2019-08-12 DIAGNOSIS — Z87891 Personal history of nicotine dependence: Secondary | ICD-10-CM

## 2019-08-12 DIAGNOSIS — I252 Old myocardial infarction: Secondary | ICD-10-CM

## 2019-08-12 DIAGNOSIS — Z7982 Long term (current) use of aspirin: Secondary | ICD-10-CM

## 2019-08-12 DIAGNOSIS — N401 Enlarged prostate with lower urinary tract symptoms: Secondary | ICD-10-CM | POA: Diagnosis not present

## 2019-08-12 DIAGNOSIS — H6123 Impacted cerumen, bilateral: Secondary | ICD-10-CM | POA: Diagnosis present

## 2019-08-12 DIAGNOSIS — H8111 Benign paroxysmal vertigo, right ear: Secondary | ICD-10-CM | POA: Diagnosis not present

## 2019-08-12 DIAGNOSIS — J309 Allergic rhinitis, unspecified: Secondary | ICD-10-CM | POA: Diagnosis present

## 2019-08-12 DIAGNOSIS — M1990 Unspecified osteoarthritis, unspecified site: Secondary | ICD-10-CM | POA: Diagnosis present

## 2019-08-12 DIAGNOSIS — R112 Nausea with vomiting, unspecified: Secondary | ICD-10-CM | POA: Diagnosis present

## 2019-08-12 DIAGNOSIS — H532 Diplopia: Secondary | ICD-10-CM | POA: Diagnosis present

## 2019-08-12 DIAGNOSIS — E538 Deficiency of other specified B group vitamins: Secondary | ICD-10-CM | POA: Diagnosis present

## 2019-08-12 DIAGNOSIS — Z8249 Family history of ischemic heart disease and other diseases of the circulatory system: Secondary | ICD-10-CM

## 2019-08-12 DIAGNOSIS — H5500 Unspecified nystagmus: Secondary | ICD-10-CM | POA: Diagnosis present

## 2019-08-12 DIAGNOSIS — I10 Essential (primary) hypertension: Secondary | ICD-10-CM | POA: Diagnosis present

## 2019-08-12 DIAGNOSIS — R55 Syncope and collapse: Secondary | ICD-10-CM

## 2019-08-12 DIAGNOSIS — R42 Dizziness and giddiness: Secondary | ICD-10-CM | POA: Diagnosis not present

## 2019-08-12 DIAGNOSIS — M5417 Radiculopathy, lumbosacral region: Secondary | ICD-10-CM | POA: Diagnosis present

## 2019-08-12 DIAGNOSIS — Z888 Allergy status to other drugs, medicaments and biological substances status: Secondary | ICD-10-CM

## 2019-08-12 DIAGNOSIS — R351 Nocturia: Secondary | ICD-10-CM | POA: Diagnosis present

## 2019-08-12 DIAGNOSIS — H8110 Benign paroxysmal vertigo, unspecified ear: Principal | ICD-10-CM | POA: Diagnosis present

## 2019-08-12 DIAGNOSIS — Z951 Presence of aortocoronary bypass graft: Secondary | ICD-10-CM

## 2019-08-12 DIAGNOSIS — E782 Mixed hyperlipidemia: Secondary | ICD-10-CM | POA: Diagnosis present

## 2019-08-12 DIAGNOSIS — I160 Hypertensive urgency: Secondary | ICD-10-CM | POA: Diagnosis present

## 2019-08-12 DIAGNOSIS — Z79899 Other long term (current) drug therapy: Secondary | ICD-10-CM

## 2019-08-12 DIAGNOSIS — Z20822 Contact with and (suspected) exposure to covid-19: Secondary | ICD-10-CM | POA: Diagnosis present

## 2019-08-12 DIAGNOSIS — Z85820 Personal history of malignant melanoma of skin: Secondary | ICD-10-CM

## 2019-08-12 LAB — CBC
HCT: 47.8 % (ref 39.0–52.0)
Hemoglobin: 15.7 g/dL (ref 13.0–17.0)
MCH: 29 pg (ref 26.0–34.0)
MCHC: 32.8 g/dL (ref 30.0–36.0)
MCV: 88.4 fL (ref 80.0–100.0)
Platelets: 167 10*3/uL (ref 150–400)
RBC: 5.41 MIL/uL (ref 4.22–5.81)
RDW: 13.5 % (ref 11.5–15.5)
WBC: 10.1 10*3/uL (ref 4.0–10.5)
nRBC: 0 % (ref 0.0–0.2)

## 2019-08-12 LAB — BASIC METABOLIC PANEL
Anion gap: 10 (ref 5–15)
BUN: 19 mg/dL (ref 8–23)
CO2: 24 mmol/L (ref 22–32)
Calcium: 9.5 mg/dL (ref 8.9–10.3)
Chloride: 106 mmol/L (ref 98–111)
Creatinine, Ser: 0.76 mg/dL (ref 0.61–1.24)
GFR calc Af Amer: >60 mL/min (ref >60)
GFR calc non Af Amer: >60 mL/min (ref >60)
Glucose, Bld: 168 mg/dL — ABNORMAL HIGH (ref 70–99)
Potassium: 4.2 mmol/L (ref 3.5–5.1)
Sodium: 140 mmol/L (ref 135–145)

## 2019-08-12 LAB — URINALYSIS, ROUTINE W REFLEX MICROSCOPIC
Bilirubin Urine: NEGATIVE
Glucose, UA: NEGATIVE mg/dL
Hgb urine dipstick: NEGATIVE
Ketones, ur: NEGATIVE mg/dL
Leukocytes,Ua: NEGATIVE
Nitrite: NEGATIVE
Protein, ur: NEGATIVE mg/dL
Specific Gravity, Urine: 1.018 (ref 1.005–1.030)
pH: 6 (ref 5.0–8.0)

## 2019-08-12 LAB — CBG MONITORING, ED: Glucose-Capillary: 109 mg/dL — ABNORMAL HIGH (ref 70–99)

## 2019-08-12 MED ORDER — TAMSULOSIN HCL 0.4 MG PO CAPS
0.4000 mg | ORAL_CAPSULE | Freq: Every day | ORAL | Status: DC
  Administered 2019-08-13: 0.4 mg via ORAL
  Filled 2019-08-12 (×2): qty 1

## 2019-08-12 MED ORDER — LACTATED RINGERS IV SOLN: INTRAVENOUS | Status: DC

## 2019-08-12 MED ORDER — PROCHLORPERAZINE EDISYLATE 10 MG/2ML IJ SOLN
10.0000 mg | Freq: Four times a day (QID) | INTRAMUSCULAR | Status: DC | PRN
  Administered 2019-08-12: 10 mg via INTRAVENOUS
  Filled 2019-08-12: qty 2

## 2019-08-12 MED ORDER — ONDANSETRON HCL 4 MG/2ML IJ SOLN
4.0000 mg | Freq: Once | INTRAMUSCULAR | Status: DC
  Filled 2019-08-12: qty 2

## 2019-08-12 MED ORDER — ONDANSETRON 4 MG PO TBDP
8.0000 mg | ORAL_TABLET | Freq: Once | ORAL | Status: AC
  Administered 2019-08-12: 8 mg via ORAL
  Filled 2019-08-12: qty 2

## 2019-08-12 MED ORDER — ONDANSETRON 4 MG PO TBDP: 4.0000 mg | ORAL_TABLET | Freq: Three times a day (TID) | ORAL | 0 refills | Status: DC | PRN

## 2019-08-12 MED ORDER — SERTRALINE HCL 100 MG PO TABS
100.0000 mg | ORAL_TABLET | Freq: Every day | ORAL | Status: DC
  Administered 2019-08-13 – 2019-08-14 (×2): 100 mg via ORAL
  Filled 2019-08-12 (×2): qty 1

## 2019-08-12 MED ORDER — MECLIZINE HCL 25 MG PO TABS
25.0000 mg | ORAL_TABLET | Freq: Four times a day (QID) | ORAL | Status: DC | PRN
  Administered 2019-08-14: 25 mg via ORAL
  Filled 2019-08-12 (×3): qty 1

## 2019-08-12 MED ORDER — NITROGLYCERIN 0.4 MG SL SUBL: 0.4000 mg | SUBLINGUAL_TABLET | SUBLINGUAL | Status: DC | PRN

## 2019-08-12 MED ORDER — ENOXAPARIN SODIUM 40 MG/0.4ML ~~LOC~~ SOLN
40.0000 mg | Freq: Every day | SUBCUTANEOUS | Status: DC
  Administered 2019-08-13 (×2): 40 mg via SUBCUTANEOUS
  Filled 2019-08-12 (×2): qty 0.4

## 2019-08-12 MED ORDER — ASPIRIN EC 81 MG PO TBEC
81.0000 mg | DELAYED_RELEASE_TABLET | Freq: Every day | ORAL | Status: DC
  Administered 2019-08-13 (×2): 81 mg via ORAL
  Filled 2019-08-12 (×2): qty 1

## 2019-08-12 MED ORDER — ENALAPRILAT 1.25 MG/ML IV SOLN
1.2500 mg | Freq: Four times a day (QID) | INTRAVENOUS | Status: DC | PRN
  Filled 2019-08-12: qty 1

## 2019-08-12 MED ORDER — ACETAMINOPHEN 650 MG RE SUPP: 650.0000 mg | Freq: Four times a day (QID) | RECTAL | Status: DC | PRN

## 2019-08-12 MED ORDER — LISINOPRIL 20 MG PO TABS
20.0000 mg | ORAL_TABLET | Freq: Every day | ORAL | Status: DC
  Administered 2019-08-13: 20 mg via ORAL
  Filled 2019-08-12: qty 1

## 2019-08-12 MED ORDER — FINASTERIDE 5 MG PO TABS
5.0000 mg | ORAL_TABLET | Freq: Every day | ORAL | Status: DC
  Administered 2019-08-13: 5 mg via ORAL
  Filled 2019-08-12 (×3): qty 1

## 2019-08-12 MED ORDER — GABAPENTIN 300 MG PO CAPS
300.0000 mg | ORAL_CAPSULE | Freq: Every day | ORAL | Status: DC
  Administered 2019-08-13 (×2): 300 mg via ORAL
  Filled 2019-08-12: qty 1
  Filled 2019-08-12: qty 3

## 2019-08-12 MED ORDER — CARBAMIDE PEROXIDE 6.5 % OT SOLN
5.0000 [drp] | Freq: Two times a day (BID) | OTIC | Status: DC
  Administered 2019-08-13 – 2019-08-14 (×4): 5 [drp] via OTIC
  Filled 2019-08-12: qty 15

## 2019-08-12 MED ORDER — ACETAMINOPHEN 325 MG PO TABS
650.0000 mg | ORAL_TABLET | Freq: Four times a day (QID) | ORAL | Status: DC | PRN
  Administered 2019-08-14: 650 mg via ORAL
  Filled 2019-08-12: qty 2

## 2019-08-12 MED ORDER — TIZANIDINE HCL 2 MG PO TABS
2.0000 mg | ORAL_TABLET | Freq: Every day | ORAL | Status: DC
  Administered 2019-08-13 (×2): 2 mg via ORAL
  Filled 2019-08-12 (×3): qty 1

## 2019-08-12 MED ORDER — MECLIZINE HCL 25 MG PO TABS
12.5000 mg | ORAL_TABLET | Freq: Once | ORAL | Status: AC
  Administered 2019-08-12: 12.5 mg via ORAL
  Filled 2019-08-12: qty 1

## 2019-08-12 MED ORDER — MECLIZINE HCL 12.5 MG PO TABS: 12.5000 mg | ORAL_TABLET | Freq: Three times a day (TID) | ORAL | 0 refills | Status: DC | PRN

## 2019-08-12 MED ORDER — SODIUM CHLORIDE 0.9% FLUSH: 3.0000 mL | Freq: Once | INTRAVENOUS | Status: DC

## 2019-08-12 MED ORDER — POLYETHYLENE GLYCOL 3350 17 G PO PACK: 17.0000 g | PACK | Freq: Every day | ORAL | Status: DC | PRN

## 2019-08-12 MED ORDER — ROSUVASTATIN CALCIUM 5 MG PO TABS
10.0000 mg | ORAL_TABLET | Freq: Every day | ORAL | Status: DC
  Administered 2019-08-13 – 2019-08-14 (×2): 10 mg via ORAL
  Filled 2019-08-12 (×3): qty 2

## 2019-08-12 MED ORDER — METOPROLOL SUCCINATE ER 25 MG PO TB24
25.0000 mg | ORAL_TABLET | Freq: Every day | ORAL | Status: DC
  Administered 2019-08-13: 25 mg via ORAL
  Filled 2019-08-12 (×2): qty 1

## 2019-08-12 NOTE — ED Triage Notes (Signed)
Pt arrives to ED w/ c/o dizziness when moving head. Pt states this has been going on for a few weeks now. Sent by PCP for eval.

## 2019-08-12 NOTE — H&P (Signed)
History and Physical    CEM KOSMAN OPF:[id number] DOB: [birth year] DOA: 08/12/2019  PCP: Antony Contras, MD  Patient coming from: Home   Chief Complaint:  Chief Complaint  Patient presents with  . Dizziness     HPI:    86 year old male with past medical history of coronary artery disease, gastroesophageal reflux disease, hypertension and benign positional vertigo in the past who presents to [facility] emergency department with complaints of intense vertigo and vomiting.  Patient explains that approximately 2 weeks ago, shortly after receiving his second Covid vaccination dose began to experience episodes of intense vertigo.  Patient describes these episodes as "the room spinning."  Symptoms seem to be precipitated when the patient attempts to get out of bed.  Patient's episodes are becoming so severe that they are associated with severe nausea to the point of vomiting.  Patient describes the vomitus as nonbilious and nonbloody.  Patient denies any palpitations, chest pain, shortness of breath, fevers, sick contacts, recent travel or confirmed contact with COVID-19.  Additionally denies dysuria diarrhea or constipation..  Patient does endorse a poor appetite over the same span of time.  Patient also states that he rarely checks his blood pressure at home and has not checked it in at least several weeks.  Patient eventually presented to [facility] emergency department for evaluation.  Upon evaluation in the emergency department patient was found to be in hypertensive urgency.  Meclizine was administered as well as Zofran with no improvement in symptoms.  MRI of the brain was performed which revealed no evidence of posterior circulation stroke.  Attempts were made to get the patient to ambulate prior to discharging home and unfortunately he was unable to due to the intensity of his symptoms.  At this point the hospitalist group was called to assess patient for admission the hospital.    Review of Systems: A 10-system review of systems has been performed and all systems are negative with the exception of what is listed in the HPI.    Past Medical History:  Diagnosis Date  . Allergic rhinitis 09/20/2014  . Anemia, unspecified   . Arthritis   . Bladder neck obstruction   . Cataracts, bilateral   . Coronary atherosclerosis of unspecified type of vessel, native or graft   . Diminished hearing, bilateral   . Dysthymic disorder   . GERD (gastroesophageal reflux disease)   . Heart murmur    as a child  . Impaired glucose tolerance 12/02/2012  . Impotence of organic origin   . Lumbar radiculopathy   . Malignant melanoma of skin of upper limb, including shoulder ([place])   . Old myocardial infarction   . Personal history of malignant melanoma of skin   . Personal history of other diseases of digestive system   . Thoracic or lumbosacral neuritis or radiculitis, unspecified   . Unspecified essential hypertension     Past Surgical History:  Procedure Laterality Date  . ARTHRODESIS  07/06/2002   of left long finger distal interphalangeal joint with Kirschner wire fixation X 3  . ATRIAL ABLATION SURGERY  05/04/2001   Dr. [name]  . CARDIAC CATHETERIZATION  09/05/2008   Revealing 4 of 4 patent grafts with native multivessel coronary artery disease, EF  of 60% without regional wall motion abnormalities.  Marland Kitchen CATARACT EXTRACTION, BILATERAL    . CHOLECYSTECTOMY    . COLONOSCOPY    . CORONARY ANGIOPLASTY  1993  . CORONARY ARTERY BYPASS GRAFT  10/17/2000   [name].  [name], [place]     AFTER LIVER TRAUMA AND HAND SURGERY  . EYE SURGERY    . HERNIA REPAIR     BILATERAL  . LEFT HEART CATH AND CORS/GRAFTS ANGIOGRAPHY N/A 04/03/2017   Procedure: LEFT HEART CATH AND CORS/GRAFTS ANGIOGRAPHY;  Surgeon: Leonie Man, MD;  Location: [place] [facility];  Service: Cardiovascular;  Laterality: N/A;  . LUMBAR LAMINECTOMY/DECOMPRESSION MICRODISCECTOMY N/A  02/02/2016   Procedure: MICRODISCECTOMY LUMBAR FOUR- LUMBAR FIVE;  Surgeon: Ashok Pall, MD;  Location: [place];  Service: Neurosurgery;  Laterality: N/A;  MICRODISCECTOMY L4-L5  . LUMBAR LAMINECTOMY/DECOMPRESSION MICRODISCECTOMY Right 08/02/2016   Procedure: MICRODISCECTOMY LUMBAR FOUR- LUMBAR FIVE RIGHT;  Surgeon: Ashok Pall, MD;  Location: [place];  Service: Neurosurgery;  Laterality: Right;  . punctured eardrum      to relieve blood build up   for ear infection  . TONSILLECTOMY       reports that he quit smoking about 63 years ago. His smoking use included cigarettes. He has a 3.00 pack-year smoking history. He has never used smokeless tobacco. He reports current alcohol use. He reports that he does not use drugs.  Allergies  Allergen Reactions  . Iohexol Other (See Comments)     Code: HIVES, Desc: PATENT STATES HE IS ALLERGIC TO IV DYE 09/14/08/RM, Onset Date: [id number]   . Ciprofloxacin Hives and Rash    "Patient unsure" just know he has a reaction to med Medical record indicates allergic reaction  . Other Other (See Comments)    UNSPECIFIED REACTION  Angioplasty dye pt.>> Pt only recalls Had "very bad reaction"    Family History  Problem Relation Age of Onset  . Heart disease Father   . Ataxia Neg Hx   . Chorea Neg Hx   . Dementia Neg Hx   . Mental retardation Neg Hx   . Migraines Neg Hx   . Multiple sclerosis Neg Hx   . Neurofibromatosis Neg Hx   . Neuropathy Neg Hx   . Parkinsonism Neg Hx   . Seizures Neg Hx   . Stroke Neg Hx      Prior to Admission medications   Medication Sig Start Date End Date Taking? Authorizing Provider  aspirin EC 81 MG tablet Take 81 mg by mouth at bedtime.    Yes [provider]  finasteride (PROSCAR) 5 MG tablet Take 5 mg by mouth at bedtime.   Yes [provider]  gabapentin (NEURONTIN) 300 MG capsule Take 300 mg by mouth at bedtime. 06/09/19  Yes [provider]  lisinopril (ZESTRIL) 5 MG tablet Take 1 tablet (5  mg total) by mouth daily. 08/11/19  Yes Imogene Burn, PA-C  metoprolol succinate (TOPROL-XL) 25 MG 24 hr tablet Take 1 tablet (25 mg total) by mouth daily. 08/11/19  Yes Imogene Burn, PA-C  nitroGLYCERIN (NITROSTAT) 0.4 MG SL tablet Place 1 tablet (0.4 mg total) under the tongue every 5 (five) minutes as needed for chest pain (x 3 doses). Reported on 06/15/2015 08/12/17  Yes Dorothy Spark, MD  rosuvastatin (CRESTOR) 10 MG tablet Take 1 tablet (10 mg total) by mouth daily. 08/11/19  Yes Imogene Burn, PA-C  sertraline (ZOLOFT) 100 MG tablet Take 100 mg by mouth daily. 11/20/18  Yes [provider]  tamsulosin (FLOMAX) 0.4 MG CAPS capsule Take 1 capsule (0.4 mg total) by mouth daily after supper. 09/24/16  Yes Biagio Borg, MD  tiZANidine (ZANAFLEX) 2 MG tablet Take 2 mg  by mouth at bedtime. 08/09/19  Yes [provider]  hydrALAZINE (APRESOLINE) 25 MG tablet Take 1 tablet (25 mg total) by mouth 2 (two) times daily. Patient not taking: Reported on 08/12/2019 08/11/19   Imogene Burn, PA-C  meclizine (ANTIVERT) 12.5 MG tablet Take 1 tablet (12.5 mg total) by mouth 3 (three) times daily as needed for dizziness. 08/12/19   Hayden Rasmussen, MD  ondansetron (ZOFRAN ODT) 4 MG disintegrating tablet Take 1 tablet (4 mg total) by mouth every 8 (eight) hours as needed for nausea or vomiting. 08/12/19   Hayden Rasmussen, MD    Physical Exam: Vitals:   08/12/19 1730 08/12/19 1745 08/12/19 1800 08/12/19 1945  BP: (!) 208/98 (!) 188/91 (!) 179/95   Pulse: 69 63 63 63  Resp:    15  Temp:      TempSrc:      SpO2: 97% 97% 97% 95%  Weight:      Height:        Constitutional: Acute alert and oriented x3, no associated distress.   Skin: no rashes, no lesions, slightly poor skin turgor noted Eyes: Pupils are equally reactive to light.  No evidence of scleral icterus or conjunctival pallor.  ENMT: Mucous membranes are moist. Posterior pharynx clear of any exudate or lesions. Normal  dentition.   Neck: normal, supple, no masses, no thyromegaly Respiratory: clear to auscultation bilaterally, no wheezing no org, no crackles. Normal respiratory effort. No accessory muscle use.  Cardiovascular: Regular rate and rhythm, no murmurs / rubs / gallops. No extremity edema. 2+ pedal pulses. No carotid bruits.  Back:   Nontender without crepitus or deformity. Abdomen: Abdomen is protuberant and soft and nontender.  No evidence of intra-abdominal masses.  Positive bowel sounds noted in all quadrants.   Musculoskeletal: No joint deformity upper and lower extremities. Good ROM, no contractures. Normal muscle tone.  Neurologic: Patient is exhibiting significant nystagmus when looking to the right as well as nystagmus and intense vertigo when turning his head to the right.  CN 2-12 grossly intact. Sensation intact, strength noted to be 5 out of 5 in all 4 extremities.  Patient is following all commands.  Patient is responsive to verbal stimuli.   Psychiatric: Patient presents as a normal mood with appropriate affect.  Patient seems to possess insight as to theircurrent situation.     Labs on Admission: I have personally reviewed following labs and imaging studies -   CBC: Recent Labs  Lab 08/11/19 0947 08/12/19 1340  WBC 10.5 10.1  HGB 15.2 15.7  HCT 43.1 47.8  MCV 86 88.4  PLT 153 485   Basic Metabolic Panel: Recent Labs  Lab 08/11/19 0947 08/12/19 1340  NA 141 140  K 3.9 4.2  CL 105 106  CO2 22 24  GLUCOSE 142* 168*  BUN 20 19  CREATININE 0.79 0.76  CALCIUM 9.5 9.5   GFR: Estimated Creatinine Clearance: 67.5 mL/min (by C-G formula based on SCr of 0.76 mg/dL). Liver Function Tests: Recent Labs  Lab 08/11/19 0947  AST 24  ALT 26  ALKPHOS 75  BILITOT 0.5  PROT 6.3  ALBUMIN 4.5   No results for input(s): LIPASE, AMYLASE in the last 168 hours. No results for input(s): AMMONIA in the last 168 hours. Coagulation Profile: No results for input(s): INR, PROTIME in  the last 168 hours. Cardiac Enzymes: No results for input(s): CKTOTAL, CKMB, CKMBINDEX, TROPONINI in the last 168 hours. BNP (last 3 results) No results for input(s):  PROBNP in the last 8760 hours. HbA1C: No results for input(s): HGBA1C in the last 72 hours. CBG: Recent Labs  Lab 08/12/19 2129  GLUCAP 109*   Lipid Profile: No results for input(s): CHOL, HDL, LDLCALC, TRIG, CHOLHDL, LDLDIRECT in the last 72 hours. Thyroid Function Tests: No results for input(s): TSH, T4TOTAL, FREET4, T3FREE, THYROIDAB in the last 72 hours. Anemia Panel: No results for input(s): VITAMINB12, FOLATE, FERRITIN, TIBC, IRON, RETICCTPCT in the last 72 hours. Urine analysis:    Component Value Date/Time   COLORURINE YELLOW 09/20/2016 2005   APPEARANCEUR CLEAR 09/20/2016 2005   LABSPEC 1.012 09/20/2016 2005   PHURINE 5.0 09/20/2016 2005   GLUCOSEU NEGATIVE 09/20/2016 2005   GLUCOSEU NEGATIVE 01/24/2016 0955   HGBUR NEGATIVE 09/20/2016 2005   BILIRUBINUR NEGATIVE 09/20/2016 2005   [place] 09/20/2016 2005   PROTEINUR NEGATIVE 09/20/2016 2005   UROBILINOGEN 2.0 (A) 01/24/2016 0955   NITRITE NEGATIVE 09/20/2016 2005   LEUKOCYTESUR NEGATIVE 09/20/2016 2005    Radiological Exams on Admission personally reviewed: MR Brain Wo Contrast (neuro protocol)  Result Date: 08/12/2019 CLINICAL DATA:  Dizziness, unsteady; ataxia, stroke suspected. Additional history provided: Patient reports dizziness when moving head, symptoms for several weeks. EXAM: MRI HEAD WITHOUT CONTRAST TECHNIQUE: Multiplanar, multiecho pulse sequences of the brain and surrounding structures were obtained without intravenous contrast. COMPARISON:  Brain MRI 11/07/2016 FINDINGS: Brain: There is no evidence of acute infarct. No evidence of intracranial mass. No midline shift or extra-axial fluid collection. No chronic intracranial blood products. Redemonstrated advanced chronic small vessel ischemic disease within the cerebral white  matter, basal ganglia and thalami. To a lesser degree, there are chronic small-vessel ischemic changes within the pons. Stable, moderate generalized parenchymal atrophy. Vascular: Flow voids maintained within the proximal large arterial vessels. Skull and upper cervical spine: No focal marrow lesion. Sinuses/Orbits: Bilateral lens replacements. Trace paranasal sinus mucosal thickening greatest within bilateral ethmoid air cells. No significant mastoid effusion. IMPRESSION: No evidence of acute intracranial abnormality, including acute infarction. Redemonstrated advanced chronic small vessel ischemic changes within the cerebral white matter, basal ganglia and thalami. Mild chronic small vessel ischemic changes are also present within the pons. Stable, moderate generalized parenchymal atrophy. Electronically Signed   By: Kellie Simmering DO   On: 08/12/2019 19:12    EKG: Personally reviewed.  Rhythm is sinus rhythm with heart rate of 78.  No dynamic ST segment changes appreciated.  Assessment/Plan Active Problems:   BPPV (benign paroxysmal positional vertigo), right   Patient presenting with intense vertigo for several weeks, resulting in his inability to ambulate or maintain normal oral intake with frequent bouts of vomiting.  On examination, simply having the patient suddenly turned his head to the right or even look to the right elicits intense vertigo with evidence of concurrent rightward nystagmus  MRI brain reveals no evidence of posterior circulation stroke  Patient has been diagnosed with BPPV in the past and likely is suffering a recurrent intense episode  Considering patient's inability to ambulate at this time, will place patient in observation, hydrate patient gently, achieve antihypertensive control overnight, and order PT vestibular therapy for the morning.  Will obtain urinalysis, folate, TSH and vitamin B12 in the meantime  I have attempted to examine the patient's ears however he has  somewhat of a bilateral cerumen impaction -regardless ear infection is unlikely  While some of the patient's medications have been known to cause dizziness and lightheadedness I believe at this point patient's presentation is more likely secondary to BPPV.  Hypertensive urgency   Patient states that he is only taking lisinopril 5 mg and metoprolol 25 mg nightly.    Patient states that he is not taking his blood pressure at occasions today his blood pressure in quite a few weeks.  We will restart home regimen of metoprolol XL 25 mg nightly  We will place patient on lisinopril 20 mg nightly  We will provide patient with as needed intravenous Vasotec for excessively elevated blood pressures  Will obtain troponin, monitor patient on telemetry    Coronary artery disease involving native coronary artery of native heart without angina pectoris   Continue home regimen of antiplatelet therapy  Continue home regimen of statin therapy  Continue home regimen of beta-blocker  Patient is currently chest pain-free  Monitoring patient on telemetry  BPH associated with nocturia   Continue home regimen of Proscar and Flomax  While Flomax can cause dizziness and lightheadedness in some patients, I do not believe that this is the case here.    Mixed hyperlipidemia  Continue home regimen of statin therapy    Bilateral cerumen impaction   Initiating Debrox drops twice daily  Code Status:  Full code Family Communication: Deferred Disposition Plan: Patient is anticipated to be discharged to home with home health services once patient has met maximum benefit from current hospitalization.   Consults called: None Admission status: Patient will be admitted to Observation and is anticipated to remain in the hospital for less than 2 midnights.   Vernelle Emerald MD [facility] Pager [phone]  If 7PM-7AM, please contact night-coverage [email or web address] Use universal [facility] password for that web site. If you do not have the password, please call the hospital operator.  08/12/2019, 9:58 PM

## 2019-08-12 NOTE — ED Provider Notes (Signed)
Troy Combs   CSN: [id number] Arrival date & time: 08/12/19  1304     History Chief Complaint  Patient presents with  . Dizziness    Troy Combs is a 86 y.o. male.  He has a history of coronary disease hypertension.  He is complaining of 2 weeks of dizziness when he moves his head associated with nausea and vomiting.  He says this started after he received a second Covid shot.  He is also noticed his blood pressure to be elevated.  He has a history of hypertension.  He saw cardiology yesterday for routine appointment and they did not make any specific changes.  He said he had vertigo about 3 years ago and had to see neurology where they shook up the crystals.  He is not sure if it felt like this or not.  He has had double vision longstanding and is seen eye doctors for it and had an MRI that no explanation. no change in his hearing.  No change in vision.  No focal numbness or weakness.  States he feels unbalanced with the dizziness.  No recent falls or head injury.  No fevers or chills.  The history is provided by the patient.  Dizziness Quality:  Room spinning and imbalance Severity:  Moderate Onset quality:  Gradual Duration:  2 weeks Timing:  Intermittent Progression:  Unchanged Chronicity:  New Context: head movement   Relieved by:  Nothing Worsened by:  Movement and turning head Ineffective treatments:  None tried Associated symptoms: nausea, vision changes (chronic) and vomiting   Associated symptoms: no blood in stool, no chest pain, no diarrhea, no headaches, no hearing loss, no shortness of breath, no syncope and no weakness   Risk factors: heart disease and hx of vertigo        Past Medical History:  Diagnosis Date  . Allergic rhinitis 09/20/2014  . Anemia, unspecified   . Arthritis   . Bladder neck obstruction   . Cataracts, bilateral   . Coronary atherosclerosis of unspecified type of vessel, native or  graft   . Diminished hearing, bilateral   . Dysthymic disorder   . GERD (gastroesophageal reflux disease)   . Heart murmur    as a child  . Impaired glucose tolerance 12/02/2012  . Impotence of organic origin   . Lumbar radiculopathy   . Malignant melanoma of skin of upper limb, including shoulder ([place])   . Old myocardial infarction   . Personal history of malignant melanoma of skin   . Personal history of other diseases of digestive system   . Thoracic or lumbosacral neuritis or radiculitis, unspecified   . Unspecified essential hypertension     Patient Active Problem List   Diagnosis Date Noted  . Personal history of malignant melanoma of skin   . Lumbar radiculopathy   . Heart murmur   . GERD (gastroesophageal reflux disease)   . Diminished hearing, bilateral   . Cataracts, bilateral   . Bladder neck obstruction   . Arthritis   . Constipation 12/12/2016  . Nausea 12/12/2016  . Coronary artery disease involving native coronary artery of native heart without angina pectoris 11/22/2016  . Dyslipidemia 11/22/2016  . History of coronary artery bypass surgery 11/22/2016  . Depression with anxiety   . Orthostasis 09/20/2016  . HNP (herniated nucleus pulposus), lumbar 02/02/2016  . Spinal stenosis of lumbar region 01/24/2016  . Sciatica, right side 01/24/2016  . Prediabetes 01/24/2016  . Otitis,  externa, infective 01/24/2016  . Left lumbar radiculopathy 01/15/2016  . PVC's (premature ventricular contractions) 05/24/2015  . Peripheral polyneuropathy 03/06/2015  . BPPV (benign paroxysmal positional vertigo) 03/06/2015  . Headache 01/18/2015  . Vertigo 01/18/2015  . Allergic rhinitis 09/20/2014  . Dyspnea on exertion 08/10/2014  . Severe dizziness 08/10/2014  . Acute diverticulitis 05/18/2014  . Diarrhea 05/18/2014  . LLQ pain 05/13/2014  . Chronic meniscal tear of knee 09/22/2013  . Primary localized osteoarthrosis, lower leg 09/22/2013  . Neck pain, bilateral 12/18/2012    . Headache(784.0) 12/18/2012  . Binocular visual disturbance 12/18/2012  . Epiretinal membrane 12/17/2012  . Status post intraocular lens implant 12/17/2012  . Preventative health care 12/02/2012  . Low back pain 12/02/2012  . Impaired glucose tolerance 12/02/2012  . Palpitations 10/09/2010  . B12 deficiency 08/30/2010  . Hearing loss 08/30/2010  . Tinnitus 08/30/2010  . Neurogenic orthostatic hypotension ([place]) 06/28/2009  . HYPERLIPIDEMIA TYPE IIB / III 05/15/2009  . CHEST PAIN UNSPECIFIED 11/23/2008  . Hypotension, unspecified 10/12/2008  . DIZZINESS 10/12/2008  . Shortness of breath 09/14/2008  . UNSPECIFIED ANEMIA 09/13/2008  . ANXIETY DEPRESSION 09/13/2008  . Old myocardial infarction 09/13/2008  . GASTROESOPHAGEAL REFLUX DISEASE, HX OF 09/13/2008  . LUMBAR RADICULOPATHY, LEFT 08/23/2008  . MELANOMA, SHOULDER 11/27/2006  . Essential hypertension 11/27/2006  . Coronary atherosclerosis 11/27/2006  . BPH associated with nocturia 11/27/2006  . ERECTILE DYSFUNCTION, ORGANIC 11/27/2006    Past Surgical History:  Procedure Laterality Date  . ARTHRODESIS  07/06/2002   of left long finger distal interphalangeal joint with Kirschner wire fixation X 3  . ATRIAL ABLATION SURGERY  05/04/2001   Dr. [name]  . CARDIAC CATHETERIZATION  09/05/2008   Revealing 4 of 4 patent grafts with native multivessel coronary artery disease, EF  of 60% without regional wall motion abnormalities.  Marland Kitchen CATARACT EXTRACTION, BILATERAL    . CHOLECYSTECTOMY    . COLONOSCOPY    . CORONARY ANGIOPLASTY  1993  . CORONARY ARTERY BYPASS GRAFT  10/17/2000   Troy Combs, [place]     AFTER LIVER TRAUMA AND HAND SURGERY  . EYE SURGERY    . HERNIA REPAIR     BILATERAL  . LEFT HEART CATH AND CORS/GRAFTS ANGIOGRAPHY N/A 04/03/2017   Procedure: LEFT HEART CATH AND CORS/GRAFTS ANGIOGRAPHY;  Surgeon: Leonie Man, MD;  Location: [facility];  Service: Cardiovascular;   Laterality: N/A;  . LUMBAR LAMINECTOMY/DECOMPRESSION MICRODISCECTOMY N/A 02/02/2016   Procedure: MICRODISCECTOMY LUMBAR FOUR- LUMBAR FIVE;  Surgeon: Ashok Pall, MD;  Location: [facility];  Service: Neurosurgery;  Laterality: N/A;  MICRODISCECTOMY L4-L5  . LUMBAR LAMINECTOMY/DECOMPRESSION MICRODISCECTOMY Right 08/02/2016   Procedure: MICRODISCECTOMY LUMBAR FOUR- LUMBAR FIVE RIGHT;  Surgeon: Ashok Pall, MD;  Location: [facility];  Service: Neurosurgery;  Laterality: Right;  . punctured eardrum      to relieve blood build up   for ear infection  . TONSILLECTOMY         Family History  Problem Relation Age of Onset  . Heart disease Father   . Ataxia Neg Hx   . Chorea Neg Hx   . Dementia Neg Hx   . Mental retardation Neg Hx   . Migraines Neg Hx   . Multiple sclerosis Neg Hx   . Neurofibromatosis Neg Hx   . Neuropathy Neg Hx   . Parkinsonism Neg Hx   . Seizures Neg Hx   . Stroke Neg Hx     Social History  Tobacco Use  . Smoking status: Former Smoker    Packs/day: 1.00    Years: 3.00    Pack years: 3.00    Types: Cigarettes    Quit date: 09/21/1955    Years since quitting: 63.9  . Smokeless tobacco: Never Used  Substance Use Topics  . Alcohol use: Yes    Alcohol/week: 0.0 standard drinks    Comment: one or two ounces a week  . Drug use: No    Home Medications Prior to Admission medications   Medication Sig Start Date End Date Taking? Authorizing Provider  Aspirin Buf,CaCarb-MgCarb-MgO, 81 MG TABS Take 81 mg by mouth daily.    [provider]  aspirin EC 81 MG tablet Take 81 mg by mouth at bedtime.     [provider]  finasteride (PROSCAR) 5 MG tablet Take 5 mg by mouth at bedtime.    [provider]  gabapentin (NEURONTIN) 300 MG capsule Take 300 mg by mouth at bedtime. 06/09/19   [provider]  hydrALAZINE (APRESOLINE) 25 MG tablet Take 1 tablet (25 mg total) by mouth 2 (two) times daily. 08/11/19   Imogene Burn, PA-C  lisinopril  (ZESTRIL) 5 MG tablet Take 1 tablet (5 mg total) by mouth daily. 08/11/19   Imogene Burn, PA-C  metoprolol succinate (TOPROL-XL) 25 MG 24 hr tablet Take 1 tablet (25 mg total) by mouth daily. 08/11/19   Imogene Burn, PA-C  nitroGLYCERIN (NITROSTAT) 0.4 MG SL tablet Place 1 tablet (0.4 mg total) under the tongue every 5 (five) minutes as needed for chest pain (x 3 doses). Reported on 06/15/2015 08/12/17   Dorothy Spark, MD  rosuvastatin (CRESTOR) 10 MG tablet Take 1 tablet (10 mg total) by mouth daily. 08/11/19   Imogene Burn, PA-C  sertraline (ZOLOFT) 100 MG tablet Take 100 mg by mouth daily. 11/20/18   [provider]  tamsulosin (FLOMAX) 0.4 MG CAPS capsule Take 1 capsule (0.4 mg total) by mouth daily after supper. 09/24/16   Biagio Borg, MD  tiZANidine (ZANAFLEX) 2 MG tablet Take 2 mg by mouth at bedtime. 08/09/19   [provider]    Allergies    Iohexol, Ciprofloxacin, and Other  Review of Systems   Review of Systems  Constitutional: Negative for fever.  HENT: Negative for hearing loss and sore throat.   Eyes: Positive for visual disturbance.  Respiratory: Negative for shortness of breath.   Cardiovascular: Negative for chest pain and syncope.  Gastrointestinal: Positive for nausea and vomiting. Negative for abdominal pain, blood in stool and diarrhea.  Genitourinary: Negative for dysuria.  Musculoskeletal: Negative for neck pain.  Skin: Negative for rash.  Neurological: Positive for dizziness. Negative for syncope, speech difficulty, weakness, numbness and headaches.    Physical Exam Updated Vital Signs BP (!) 160/90 (BP Location: Left Arm)   Pulse 77   Temp 97.7 F (36.5 C) (Oral)   Resp 16   Ht 5\' 9"  (1.753 m)   Wt 78 kg   SpO2 98%   BMI 25.40 kg/m   Physical Exam Vitals and nursing Combs reviewed.  Constitutional:      Appearance: Normal appearance. He is well-developed.  HENT:     Head: Normocephalic and atraumatic.     Right Ear:  Tympanic membrane normal.     Left Ear: Tympanic membrane normal.  Eyes:     Conjunctiva/sclera: Conjunctivae normal.  Cardiovascular:     Rate and Rhythm: Normal rate and regular rhythm.  Heart sounds: No murmur.  Pulmonary:     Effort: Pulmonary effort is normal. No respiratory distress.     Breath sounds: Normal breath sounds.  Abdominal:     Palpations: Abdomen is soft.     Tenderness: There is no abdominal tenderness.  Musculoskeletal:        General: No deformity or signs of injury. Normal range of motion.     Cervical back: Neck supple.  Skin:    General: Skin is warm and dry.     Capillary Refill: Capillary refill takes less than 2 seconds.  Neurological:     General: No focal deficit present.     Mental Status: He is alert and oriented to person, place, and time.     Cranial Nerves: No cranial nerve deficit.     Sensory: No sensory deficit.     Motor: No weakness.     Coordination: Coordination normal.     ED Results / Procedures / Treatments   Labs (all labs ordered are listed, but only abnormal results are displayed) Labs Reviewed  BASIC METABOLIC PANEL - Abnormal; Notable for the following components:      Result Value   Glucose, Bld 168 (*)    All other components within normal limits  HEMOGLOBIN A1C - Abnormal; Notable for the following components:   Hgb A1c MFr Bld 6.4 (*)    All other components within normal limits  CBC WITH DIFFERENTIAL/PLATELET - Abnormal; Notable for the following components:   Platelets 144 (*)    All other components within normal limits  VITAMIN B12 - Abnormal; Notable for the following components:   Vitamin B-12 161 (*)    All other components within normal limits  VITAMIN B12 - Abnormal; Notable for the following components:   Vitamin B-12 153 (*)    All other components within normal limits  CBG MONITORING, ED - Abnormal; Notable for the following components:   Glucose-Capillary 109 (*)    All other components within  normal limits  SARS CORONAVIRUS 2 (TAT 6-24 HRS)  CBC  URINALYSIS, ROUTINE W REFLEX MICROSCOPIC  TSH  FOLATE  MAGNESIUM  TROPONIN I (HIGH SENSITIVITY)    EKG EKG Interpretation  Date/Time:  Thursday August 12 2019 13:30:15 EDT Ventricular Rate:  78 PR Interval:  170 QRS Duration: 64 QT Interval:  366 QTC Calculation: 417 R Axis:   -9 Text Interpretation: Normal sinus rhythm Inferior infarct , age undetermined Abnormal ECG No significant change since prior 12/18 Confirmed by Aletta Edouard [phone]) on 08/12/2019 5:10:23 PM   Radiology MR Brain Wo Contrast (neuro protocol)  Result Date: 08/12/2019 CLINICAL DATA:  Dizziness, unsteady; ataxia, stroke suspected. Additional history provided: Patient reports dizziness when moving head, symptoms for several weeks. EXAM: MRI HEAD WITHOUT CONTRAST TECHNIQUE: Multiplanar, multiecho pulse sequences of the brain and surrounding structures were obtained without intravenous contrast. COMPARISON:  Brain MRI 11/07/2016 FINDINGS: Brain: There is no evidence of acute infarct. No evidence of intracranial mass. No midline shift or extra-axial fluid collection. No chronic intracranial blood products. Redemonstrated advanced chronic small vessel ischemic disease within the cerebral white matter, basal ganglia and thalami. To a lesser degree, there are chronic small-vessel ischemic changes within the pons. Stable, moderate generalized parenchymal atrophy. Vascular: Flow voids maintained within the proximal large arterial vessels. Skull and upper cervical spine: No focal marrow lesion. Sinuses/Orbits: Bilateral lens replacements. Trace paranasal sinus mucosal thickening greatest within bilateral ethmoid air cells. No significant mastoid effusion. IMPRESSION: No evidence of acute intracranial  abnormality, including acute infarction. Redemonstrated advanced chronic small vessel ischemic changes within the cerebral white matter, basal ganglia and thalami. Mild chronic  small vessel ischemic changes are also present within the pons. Stable, moderate generalized parenchymal atrophy. Electronically Signed   By: Kellie Simmering DO   On: 08/12/2019 19:12    Procedures Procedures (including critical care time)  Medications Ordered in ED Medications  sodium chloride flush (NS) 0.9 % injection 3 mL (has no administration in time range)  ondansetron (ZOFRAN) injection 4 mg (has no administration in time range)  meclizine (ANTIVERT) tablet 12.5 mg (has no administration in time range)    ED Course  I have reviewed the triage vital signs and the nursing notes.  Pertinent labs & imaging results that were available during my care of the patient were reviewed by me and considered in my medical decision making (see chart for details).  Clinical Course as of Aug 12 1016  Thu Aug 12, 2019  1812 Discussed with Dr. Deneise Lever neurology who recommends getting head CT versus to make sure there is no other bleed and then can proceed with the MRI and some blood pressure dropping   [MB]  2023 Patient's MRI did not show any acute findings.  His symptoms have improved here.  We will put him on some meclizine and some nausea medication.  Notes from his cardiologist to comment upon his labile blood pressure.  Recommend that he follow back up with them for further management of that.  He also has seen neurology in the past for his vertigo so we will refer him back to [place]   [MB]  2054 Patient tried to get up to see if he would go home.  He became acutely dizzy blood pressure shot up and he was nauseous again.  I do not think is going to be able to go home tonight.  We discussed admission and he is in agreement.   [MB]  2111 Discussed with [facility] hospitalist Dr. Cyd Silence who will evaluate the patient for admission.   [MB]    Clinical Course User Index [MB] Hayden Rasmussen, MD   MDM Rules/Calculators/A&P                     This patient complains of nausea and vomiting along  with dizziness; this involves an extensive number of treatment Options and is a complaint that carries with it a high risk of complications and Morbidity. The differential includes vertigo, cerebellar stroke, medication reaction, metabolic derangement  I ordered, reviewed and interpreted labs, which included elevated glucose, normal hemoglobin, negative Covid testing, negative urinalysis I ordered medication meclizine and Zofran with improvement in his symptoms I ordered imaging studies which included MRI brain and I independently    visualized and interpreted imaging which showed no acute abnormalities Previous records obtained and reviewed in epic I consulted Dr. Leonel Ramsay neurology and Dr. Marlyce Huge [facility] hospitalist and discussed lab and imaging findings  After the interventions stated above, I reevaluated the patient and found the patient still to be very symptomatic with any type of movement.  Will need admission for further management.  Also when he is vertiginous his blood pressure is markedly elevated.   Final Clinical Impression(s) / ED Diagnoses Final diagnoses:  Dizziness  Non-intractable vomiting with nausea, unspecified vomiting type  Essential hypertension    Rx / DC Orders ED Discharge Orders    None       Hayden Rasmussen, MD 08/13/19 1020

## 2019-08-13 ENCOUNTER — Observation Stay (HOSPITAL_BASED_OUTPATIENT_CLINIC_OR_DEPARTMENT_OTHER): Payer: Medicare Other

## 2019-08-13 ENCOUNTER — Encounter (HOSPITAL_COMMUNITY): Payer: Self-pay | Admitting: Internal Medicine

## 2019-08-13 ENCOUNTER — Observation Stay (HOSPITAL_COMMUNITY): Payer: Medicare Other

## 2019-08-13 DIAGNOSIS — M5417 Radiculopathy, lumbosacral region: Secondary | ICD-10-CM | POA: Diagnosis present

## 2019-08-13 DIAGNOSIS — Z20822 Contact with and (suspected) exposure to covid-19: Secondary | ICD-10-CM | POA: Diagnosis present

## 2019-08-13 DIAGNOSIS — I160 Hypertensive urgency: Secondary | ICD-10-CM

## 2019-08-13 DIAGNOSIS — K219 Gastro-esophageal reflux disease without esophagitis: Secondary | ICD-10-CM | POA: Diagnosis present

## 2019-08-13 DIAGNOSIS — R42 Dizziness and giddiness: Secondary | ICD-10-CM

## 2019-08-13 DIAGNOSIS — R351 Nocturia: Secondary | ICD-10-CM

## 2019-08-13 DIAGNOSIS — H5500 Unspecified nystagmus: Secondary | ICD-10-CM | POA: Diagnosis present

## 2019-08-13 DIAGNOSIS — E782 Mixed hyperlipidemia: Secondary | ICD-10-CM

## 2019-08-13 DIAGNOSIS — H532 Diplopia: Secondary | ICD-10-CM | POA: Diagnosis present

## 2019-08-13 DIAGNOSIS — Z85820 Personal history of malignant melanoma of skin: Secondary | ICD-10-CM | POA: Diagnosis not present

## 2019-08-13 DIAGNOSIS — I2510 Atherosclerotic heart disease of native coronary artery without angina pectoris: Secondary | ICD-10-CM

## 2019-08-13 DIAGNOSIS — M1990 Unspecified osteoarthritis, unspecified site: Secondary | ICD-10-CM | POA: Diagnosis present

## 2019-08-13 DIAGNOSIS — Z8249 Family history of ischemic heart disease and other diseases of the circulatory system: Secondary | ICD-10-CM | POA: Diagnosis not present

## 2019-08-13 DIAGNOSIS — H8111 Benign paroxysmal vertigo, right ear: Secondary | ICD-10-CM | POA: Diagnosis not present

## 2019-08-13 DIAGNOSIS — R112 Nausea with vomiting, unspecified: Secondary | ICD-10-CM | POA: Diagnosis present

## 2019-08-13 DIAGNOSIS — N401 Enlarged prostate with lower urinary tract symptoms: Secondary | ICD-10-CM | POA: Diagnosis present

## 2019-08-13 DIAGNOSIS — R55 Syncope and collapse: Secondary | ICD-10-CM | POA: Diagnosis not present

## 2019-08-13 DIAGNOSIS — Z79899 Other long term (current) drug therapy: Secondary | ICD-10-CM | POA: Diagnosis not present

## 2019-08-13 DIAGNOSIS — H8110 Benign paroxysmal vertigo, unspecified ear: Secondary | ICD-10-CM | POA: Diagnosis present

## 2019-08-13 DIAGNOSIS — I10 Essential (primary) hypertension: Secondary | ICD-10-CM | POA: Diagnosis present

## 2019-08-13 DIAGNOSIS — H6123 Impacted cerumen, bilateral: Secondary | ICD-10-CM | POA: Diagnosis present

## 2019-08-13 DIAGNOSIS — Z7982 Long term (current) use of aspirin: Secondary | ICD-10-CM | POA: Diagnosis not present

## 2019-08-13 DIAGNOSIS — J309 Allergic rhinitis, unspecified: Secondary | ICD-10-CM | POA: Diagnosis present

## 2019-08-13 DIAGNOSIS — Z951 Presence of aortocoronary bypass graft: Secondary | ICD-10-CM | POA: Diagnosis not present

## 2019-08-13 DIAGNOSIS — E538 Deficiency of other specified B group vitamins: Secondary | ICD-10-CM | POA: Diagnosis present

## 2019-08-13 DIAGNOSIS — Z87891 Personal history of nicotine dependence: Secondary | ICD-10-CM | POA: Diagnosis not present

## 2019-08-13 DIAGNOSIS — I252 Old myocardial infarction: Secondary | ICD-10-CM | POA: Diagnosis not present

## 2019-08-13 LAB — HEMOGLOBIN A1C
Hgb A1c MFr Bld: 6.4 % — ABNORMAL HIGH (ref 4.8–5.6)
Mean Plasma Glucose: 136.98 mg/dL

## 2019-08-13 LAB — CBC WITH DIFFERENTIAL/PLATELET
Abs Immature Granulocytes: 0.06 10*3/uL (ref 0.00–0.07)
Basophils Absolute: 0.1 10*3/uL (ref 0.0–0.1)
Basophils Relative: 1 %
Eosinophils Absolute: 0.1 10*3/uL (ref 0.0–0.5)
Eosinophils Relative: 2 %
HCT: 42.4 % (ref 39.0–52.0)
Hemoglobin: 14 g/dL (ref 13.0–17.0)
Immature Granulocytes: 1 %
Lymphocytes Relative: 14 %
Lymphs Abs: 1.3 10*3/uL (ref 0.7–4.0)
MCH: 29 pg (ref 26.0–34.0)
MCHC: 33 g/dL (ref 30.0–36.0)
MCV: 88 fL (ref 80.0–100.0)
Monocytes Absolute: 0.7 10*3/uL (ref 0.1–1.0)
Monocytes Relative: 8 %
Neutro Abs: 6.9 10*3/uL (ref 1.7–7.7)
Neutrophils Relative %: 74 %
Platelets: 144 10*3/uL — ABNORMAL LOW (ref 150–400)
RBC: 4.82 MIL/uL (ref 4.22–5.81)
RDW: 13.3 % (ref 11.5–15.5)
WBC: 9.2 10*3/uL (ref 4.0–10.5)
nRBC: 0 % (ref 0.0–0.2)

## 2019-08-13 LAB — FOLATE: Folate: 10.1 ng/mL (ref >5.9)

## 2019-08-13 LAB — VITAMIN B12
Vitamin B-12: 161 pg/mL — ABNORMAL LOW (ref 180–914)
Vitamin B-12: 153 pg/mL — ABNORMAL LOW (ref 180–914)

## 2019-08-13 LAB — MAGNESIUM: Magnesium: 2 mg/dL (ref 1.7–2.4)

## 2019-08-13 LAB — TSH: TSH: 2.884 u[IU]/mL (ref 0.350–4.500)

## 2019-08-13 LAB — ECHOCARDIOGRAM COMPLETE
Height: 69 in
Weight: 2752 oz

## 2019-08-13 LAB — TROPONIN I (HIGH SENSITIVITY): Troponin I (High Sensitivity): 13 ng/L (ref <18)

## 2019-08-13 LAB — SARS CORONAVIRUS 2 (TAT 6-24 HRS): SARS Coronavirus 2: NEGATIVE

## 2019-08-13 MED ORDER — LACTATED RINGERS IV BOLUS
500.0000 mL | Freq: Once | INTRAVENOUS | Status: AC
  Administered 2019-08-13: 500 mL via INTRAVENOUS

## 2019-08-13 MED ORDER — LACTATED RINGERS IV SOLN: INTRAVENOUS | Status: AC

## 2019-08-13 MED ORDER — LISINOPRIL 10 MG PO TABS
10.0000 mg | ORAL_TABLET | Freq: Every day | ORAL | Status: DC
  Administered 2019-08-13: 10 mg via ORAL
  Filled 2019-08-13: qty 1

## 2019-08-13 NOTE — ED Notes (Signed)
Lunch Tray Ordered @ 1047. 

## 2019-08-13 NOTE — Progress Notes (Signed)
  Echocardiogram 2D Echocardiogram has been performed.  Troy Combs M 08/13/2019, 11:32 AM

## 2019-08-13 NOTE — Evaluation (Signed)
Physical Therapy Evaluation Patient Details Name: Troy Combs MRN: [id number] DOB: 1931-10-25 Today's Date: 08/13/2019   History of Present Illness  86yo male c/o intense vertigo and vomiting x3 weeks in duration. MRI negative for acute CVA. PMH B cataract, HOH, MI, thoracic/lumbar neuritis, cardiac cath, CABG 2002, microdiscectomies  Clinical Impression   Patient received in bed, pleasant but distressed about his dizziness. Note chronic R eye deficits including R eye adduction at rest and drooping R eyelid, also chronic double vision and visual deficits after his cataract surgeries as well as a history of orthostatic hypotension. Also with history of BPPV. See below for outcome/measures of vestibular exam. Orthostatics positive but recovered to WNL values with standing x3 minutes. Definitely appears to have R sided posterior canal BPPV- treated this diagnoses with appropriate PT interventions and able to get dizziness/vertigo down to 2-3/10 at EOS from a 7-8/10 at beginning of session. MD and RN made aware of PT findings/treatments and follow-up recommendations including short term meclizine for home. Left in bed with MD present and attending, all needs otherwise met today.     08/13/19 0001  Vestibular Assessment  General Observation reports he feels dizziness really only hits him when he sits up; hx of vestibular issues in the past. No dizziness looking R   Symptom Behavior  Subjective history of current problem this dizziness started about 3 weeks ago  Type of Dizziness  Diplopia;Imbalance;Spinning;"World moves";Vertigo (has had double vision issues chronically- not related )  Frequency of Dizziness very often- basically every time he changes position   Duration of Dizziness unsure- maybe 30 minutes if he sits back and relaxes   Symptom Nature Motion provoked  Aggravating Factors Activity in general;Supine to sit;Sit to stand;Rolling to right;Rolling to left  Relieving Factors No  known relieving factors  Progression of Symptoms Worse  History of similar episodes hx of similar sx 3 years ago- treated at [facility]   Oculomotor Exam  Oculomotor Alignment Abnormal (R eyeball more adducted at rest )  Ocular ROM WNL- some end range nystagmus but this is generally WNL   Spontaneous Right beating nystagmus (very slight)  Gaze-induced  Age appropriate nystagmus at end range  Head shaking Horizontal Absent  Head Shaking Vertical Absent  Smooth Pursuits Intact  Saccades Intact  Vestibulo-Ocular Reflex  VOR Cancellation Normal  Positional Testing  Dix-Hallpike Dix-Hallpike Right;Dix-Hallpike Left  Dix-Hallpike Right  Dix-Hallpike Right Duration 10 seconds   Dix-Hallpike Right Symptoms Right nystagmus  Dix-Hallpike Left  Dix-Hallpike Left Duration none   Dix-Hallpike Left Symptoms No nystagmus  Cognition  Cognition Orientation Level Oriented x 4  Orthostatics  BP supine (x 5 minutes) 138/79  HR supine (x 5 minutes) 95  BP sitting 130/74  HR sitting 67  BP standing (after 1 minute) 95/63  HR standing (after 1 minute) 70  BP standing (after 3 minutes) 111/68  HR standing (after 3 minutes) 70      Follow Up Recommendations Outpatient PT;Other (comment)(neuro OP PT for vestibular follow-up)    Equipment Recommendations  None recommended by PT(has all necessary DME)    Recommendations for Other Services       Precautions / Restrictions Precautions Precautions: Fall;Other (comment) Precaution Comments: chronic double vision (more vertical than horizontal) Restrictions Weight Bearing Restrictions: No      Mobility  Bed Mobility Overal bed mobility: Needs Assistance Bed Mobility: Supine to Sit;Sit to Supine     Supine to sit: Mod assist Sit to supine: Min guard   General  bed mobility comments: increased assist getting up from hospital stretcher due to the stretcher being uncomfortable on his back  Transfers Overall transfer level: Needs  assistance Equipment used: Rolling walker (2 wheeled) Transfers: Sit to/from Stand Sit to Stand: Min guard         General transfer comment: min guard for safety, no physical assist given  Ambulation/Gait             General Gait Details: DNT  Stairs            Wheelchair Mobility    Modified Rankin (Stroke Patients Only)       Balance Overall balance assessment: Mild deficits observed, not formally tested                                           Pertinent Vitals/Pain Pain Assessment: No/denies pain    Home Living Family/patient expects to be discharged to:: Private residence Living Arrangements: Spouse/significant other Available Help at Discharge: Family;Available 24 hours/day Type of Home: Other(Comment)(townhome) Home Access: Level entry     Home Layout: One level Home Equipment: Shower seat - built in;Hand held shower head;Grab bars - tub/shower;Other (comment) Additional Comments: also has an upright walker- uses it quite often especially when he goes outside    Prior Function Level of Independence: Independent with assistive device(s)         Comments: wife is recovering from wrist fracture, cannot really provide physical assist right now. Daughter from [place] is here temporarily to help but may need to go home soon- here maybe another week?     Hand Dominance   Dominant Hand: Right    Extremity/Trunk Assessment        Lower Extremity Assessment Lower Extremity Assessment: Generalized weakness    Cervical / Trunk Assessment Cervical / Trunk Assessment: Kyphotic  Communication   Communication: No difficulties  Cognition Arousal/Alertness: Awake/alert Behavior During Therapy: WFL for tasks assessed/performed Overall Cognitive Status: Within Functional Limits for tasks assessed                                        General Comments General comments (skin integrity, edema, etc.):  dizziness/vertigo 7-8/10 at beginning of session, down to 2-3/10 following BPPV tx    Exercises     Assessment/Plan    PT Assessment Patient needs continued PT services  PT Problem List Decreased activity tolerance;Decreased coordination;Decreased mobility;Decreased balance       PT Treatment Interventions DME instruction;Balance training;Gait training;Functional mobility training;Therapeutic activities;Therapeutic exercise;Patient/family education;Other (comment)(vestibular tx)    PT Goals (Current goals can be found in the Care Plan section)  Acute Rehab PT Goals Patient Stated Goal: get rid of dizziness PT Goal Formulation: With patient Time For Goal Achievement: 08/27/19 Potential to Achieve Goals: Good    Frequency Min 3X/week   Barriers to discharge        Co-evaluation               AM-PAC PT "6 Clicks" Mobility  Outcome Measure Help needed turning from your back to your side while in a flat bed without using bedrails?: A Little Help needed moving from lying on your back to sitting on the side of a flat bed without using bedrails?: A Lot Help needed moving to and from a bed  to a chair (including a wheelchair)?: A Little Help needed standing up from a chair using your arms (e.g., wheelchair or bedside chair)?: A Little Help needed to walk in hospital room?: A Little Help needed climbing 3-5 steps with a railing? : A Little 6 Click Score: 17    End of Session Equipment Utilized During Treatment: Gait belt Activity Tolerance: Patient tolerated treatment well Patient left: in bed;with call bell/phone within reach;Other (comment)(MD present and attending) Nurse Communication: Mobility status;Other (comment)(R BPPV and PT reccs) PT Visit Diagnosis: Unsteadiness on feet (R26.81);Dizziness and giddiness (R42)    Time: 0912-1020 PT Time Calculation (min) (ACUTE ONLY): 68 min   Charges:   PT Evaluation $PT Eval Moderate Complexity: 1 Mod PT  Treatments $Neuromuscular Re-education: 38-52 mins $Self Care/Home Management: 8-22        Windell Norfolk, DPT, PN1   Supplemental Physical Therapist [place]    Pager [phone] Acute Rehab Office [phone]

## 2019-08-13 NOTE — Progress Notes (Signed)
Carotid duplex has been completed.   Preliminary results in CV Proc.   Abram Sander 08/13/2019 11:21 AM

## 2019-08-13 NOTE — ED Notes (Signed)
BP 76/44, manually auscultated.

## 2019-08-13 NOTE — ED Notes (Signed)
Pt noted to be hypotensive, unsatisfactory MAP. Provider paged in reference to urgent VS concerns.

## 2019-08-13 NOTE — ED Notes (Signed)
Report given to 5c 

## 2019-08-13 NOTE — ED Notes (Signed)
Tele   bfast ordered  

## 2019-08-13 NOTE — Progress Notes (Signed)
PROGRESS NOTE    Troy Combs  [id number] DOB: Jul 21, 1931 DOA: 08/12/2019 PCP: Antony Contras, MD   Brief Narrative:  86 year old with history of CAD, BPPV, BPH, HTN presented with onset of dizziness which has been worsening over past 2 weeks.  Initially also hypertensive in the ER.  MRI of the brain was negative for CVA.  Admitted for concerns of significant amount of dizziness likely from BPPV.   Assessment & Plan:   Active Problems:   Mixed hyperlipidemia   BPH associated with nocturia   BPPV (benign paroxysmal positional vertigo), right   Coronary artery disease involving native coronary artery of native heart without angina pectoris   Hypertensive urgency   Impacted cerumen of both ears   Dizziness likely benign paroxysmal positional vertigo, towards right side Bilateral cerumen impaction -Patient is still persistently dizzy, slightly improved after working with therapy this morning but has not resolved yet. -MRI of the brain is negative -Supportive care. -No evidence of infection. -Check orthostatics -Given ongoing cardiac history will perform carotid Dopplers and echocardiogram -Gentle hydration  -Debrox drops twice daily  History of coronary artery disease -Currently patient is chest pain-free.  Continue aspirin, lisinopril, metoprolol, Crestor  Hypertensive urgency, improved Essential hypertension -Metoprolol, lisinopril  BPH -BPH   DVT prophylaxis: Lovenox Code Status: Full code Family Communication: None Disposition Plan:   Patient From= home  Patient Anticipated D/C place= Home  Barriers= patient is still persistently dizzy does not feel back to his baseline.  Unsafe for discharge.  In the meantime we will perform basic cardiac evaluation given his underlying CAD.    Subjective: During my evaluation his dizziness had improved after working with physical therapy but not back to baseline.  He is afraid of getting up and walking around due to  persistent dizziness  Review of Systems Otherwise negative except as per HPI, including: General: Denies fever, chills, night sweats or unintended weight loss. Resp: Denies cough, wheezing, shortness of breath. Cardiac: Denies chest pain, palpitations, orthopnea, paroxysmal nocturnal dyspnea. GI: Denies abdominal pain, nausea, vomiting, diarrhea or constipation GU: Denies dysuria, frequency, hesitancy or incontinence MS: Denies muscle aches, joint pain or swelling Neuro: Denies headache, neurologic deficits (focal weakness, numbness, tingling), abnormal gait Psych: Denies anxiety, depression, SI/HI/AVH Skin: Denies new rashes or lesions ID: Denies sick contacts, exotic exposures, travel  Examination:  General exam: Appears calm and comfortable  Respiratory system: Clear to auscultation. Respiratory effort normal. Cardiovascular system: S1 & S2 heard, RRR. No JVD, murmurs, rubs, gallops or clicks. No pedal edema. Gastrointestinal system: Abdomen is nondistended, soft and nontender. No organomegaly or masses felt. Normal bowel sounds heard. Central nervous system: Alert and oriented. No focal neurological deficits. Extremities: Symmetric 5 x 5 power. Skin: No rashes, lesions or ulcers Psychiatry: Judgement and insight appear normal. Mood & affect appropriate.     Objective: Vitals:   08/13/19 1000 08/13/19 1100 08/13/19 1200 08/13/19 1330  BP: 129/67 (!) 142/73 (!) 144/76 (!) 155/72  Pulse: 60   64  Resp: (!) 24 18 17 18   Temp: 98 F (36.7 C)     TempSrc: Oral     SpO2: 94%   95%  Weight:      Height:        Intake/Output Summary (Last 24 hours) at 08/13/2019 1402 Last data filed at 08/13/2019 0457 Gross per 24 hour  Intake 316.67 ml  Output --  Net 316.67 ml   Filed Weights   08/12/19 1308  Weight: 78 kg  Data Reviewed:   CBC: Recent Labs  Lab 08/11/19 0947 08/12/19 1340 08/13/19 0247  WBC 10.5 10.1 9.2  NEUTROABS  --   --  6.9  HGB 15.2 15.7 14.0   HCT 43.1 47.8 42.4  MCV 86 88.4 88.0  PLT 153 167 [id number]*   Basic Metabolic Panel: Recent Labs  Lab 08/11/19 0947 08/12/19 1340 08/13/19 0247  NA 141 140  --   K 3.9 4.2  --   CL 105 106  --   CO2 22 24  --   GLUCOSE 142* 168*  --   BUN 20 19  --   CREATININE 0.79 0.76  --   CALCIUM 9.5 9.5  --   MG  --   --  2.0   GFR: Estimated Creatinine Clearance: 67.5 mL/min (by C-G formula based on SCr of 0.76 mg/dL). Liver Function Tests: Recent Labs  Lab 08/11/19 0947  AST 24  ALT 26  ALKPHOS 75  BILITOT 0.5  PROT 6.3  ALBUMIN 4.5   No results for input(s): LIPASE, AMYLASE in the last 168 hours. No results for input(s): AMMONIA in the last 168 hours. Coagulation Profile: No results for input(s): INR, PROTIME in the last 168 hours. Cardiac Enzymes: No results for input(s): CKTOTAL, CKMB, CKMBINDEX, TROPONINI in the last 168 hours. BNP (last 3 results) No results for input(s): PROBNP in the last 8760 hours. HbA1C: Recent Labs    08/13/19 0247  HGBA1C 6.4*   CBG: Recent Labs  Lab 08/12/19 2129  GLUCAP 109*   Lipid Profile: No results for input(s): CHOL, HDL, LDLCALC, TRIG, CHOLHDL, LDLDIRECT in the last 72 hours. Thyroid Function Tests: Recent Labs    08/13/19 0247  TSH 2.884   Anemia Panel: Recent Labs    08/13/19 0247  VITAMINB12 153*  161*  FOLATE 10.1   Sepsis Labs: No results for input(s): PROCALCITON, LATICACIDVEN in the last 168 hours.  Recent Results (from the past 240 hour(s))  SARS CORONAVIRUS 2 (TAT 6-24 HRS) Nasopharyngeal Nasopharyngeal Swab     Status: None   Collection Time: 08/12/19  9:20 PM   Specimen: Nasopharyngeal Swab  Result Value Ref Range Status   SARS Coronavirus 2 NEGATIVE NEGATIVE Final    Comment: (NOTE) SARS-CoV-2 target nucleic acids are NOT DETECTED. The SARS-CoV-2 RNA is generally detectable in upper and lower respiratory specimens during the acute phase of infection. Negative results do not preclude SARS-CoV-2  infection, do not rule out co-infections with other pathogens, and should not be used as the sole basis for treatment or other patient management decisions. Negative results must be combined with clinical observations, patient history, and epidemiological information. The expected result is Negative. Fact Sheet for Patients: SugarRoll.be Fact Sheet for Healthcare Providers: https://www.woods-mathews.com/ This test is not yet approved or cleared by the [place] FDA and  has been authorized for detection and/or diagnosis of SARS-CoV-2 by FDA under an Emergency Use Authorization (EUA). This EUA will remain  in effect (meaning this test can be used) for the duration of the COVID-19 declaration under Section 56 4(b)(1) of the Act, 21 U.S.C. section 360bbb-3(b)(1), unless the authorization is terminated or revoked sooner. Performed at [facility], [address], [place], [place] [address]          Radiology Studies: MR Brain Wo Contrast (neuro protocol)  Result Date: 08/12/2019 CLINICAL DATA:  Dizziness, unsteady; ataxia, stroke suspected. Additional history provided: Patient reports dizziness when moving head, symptoms for several weeks. EXAM: MRI HEAD WITHOUT  CONTRAST TECHNIQUE: Multiplanar, multiecho pulse sequences of the brain and surrounding structures were obtained without intravenous contrast. COMPARISON:  Brain MRI 11/07/2016 FINDINGS: Brain: There is no evidence of acute infarct. No evidence of intracranial mass. No midline shift or extra-axial fluid collection. No chronic intracranial blood products. Redemonstrated advanced chronic small vessel ischemic disease within the cerebral white matter, basal ganglia and thalami. To a lesser degree, there are chronic small-vessel ischemic changes within the pons. Stable, moderate generalized parenchymal atrophy. Vascular: Flow voids maintained within the proximal large arterial vessels.  Skull and upper cervical spine: No focal marrow lesion. Sinuses/Orbits: Bilateral lens replacements. Trace paranasal sinus mucosal thickening greatest within bilateral ethmoid air cells. No significant mastoid effusion. IMPRESSION: No evidence of acute intracranial abnormality, including acute infarction. Redemonstrated advanced chronic small vessel ischemic changes within the cerebral white matter, basal ganglia and thalami. Mild chronic small vessel ischemic changes are also present within the pons. Stable, moderate generalized parenchymal atrophy. Electronically Signed   By: Kellie Simmering DO   On: 08/12/2019 19:12   ECHOCARDIOGRAM COMPLETE  Result Date: 08/13/2019    ECHOCARDIOGRAM REPORT   Patient Name:   Troy Combs Date of Exam: 08/13/2019 Medical Rec #:  [id number]         Height:       69.0 in Accession #:    [id number]        Weight:       172.0 lb Date of Birth:  [birth year]         BSA:          1.938 m Patient Age:    58 years          BP:           129/67 mmHg Patient Gender: M                 HR:           60 bpm. Exam Location:  Inpatient Procedure: 2D Echo Indications:    Syncope 780.2 / R55  History:        Patient has prior history of Echocardiogram examinations, most                 recent 08/16/2014. CAD and Previous Myocardial Infarction, Mitral                 Valve Prolapse; Signs/Symptoms:Murmur. GERD, anemia.  Sonographer:    Darlina Sicilian RDCS Referring Phys: [id number] Zamyiah Tino CHIRAG Diavion Labrador IMPRESSIONS  1. Left ventricular ejection fraction, by estimation, is 60 to 65%. The left ventricle has normal function. The left ventricle has no regional wall motion abnormalities. There is mild left ventricular hypertrophy. Left ventricular diastolic parameters are consistent with Grade II diastolic dysfunction (pseudonormalization). Elevated left ventricular end-diastolic pressure.  2. Right ventricular systolic function is normal. The right ventricular size is mildly enlarged. There is mildly  elevated pulmonary artery systolic pressure.  3. Left atrial size was mildly dilated.  4. The mitral valve is degenerative. Trivial mitral valve regurgitation. No evidence of mitral stenosis.  5. The aortic valve is tricuspid. Aortic valve regurgitation is trivial. No aortic stenosis is present.  6. The inferior vena cava is normal in size with greater than 50% respiratory variability, suggesting right atrial pressure of 3 mmHg. FINDINGS  Left Ventricle: Left ventricular ejection fraction, by estimation, is 60 to 65%. The left ventricle has normal function. The left ventricle has no regional wall motion abnormalities. The left ventricular internal cavity size was  normal in size. There is  mild left ventricular hypertrophy. Left ventricular diastolic parameters are consistent with Grade II diastolic dysfunction (pseudonormalization). Elevated left ventricular end-diastolic pressure. Right Ventricle: The right ventricular size is mildly enlarged. No increase in right ventricular wall thickness. Right ventricular systolic function is normal. There is mildly elevated pulmonary artery systolic pressure. The tricuspid regurgitant velocity is 3.03 m/s, and with an assumed right atrial pressure of 3 mmHg, the estimated right ventricular systolic pressure is [id number]. Left Atrium: Left atrial size was mildly dilated. Right Atrium: Right atrial size was normal in size. Pericardium: There is no evidence of pericardial effusion. Mitral Valve: The mitral valve is degenerative in appearance. There is mild thickening of the mitral valve leaflet(s). There is mild calcification of the mitral valve leaflet(s). Normal mobility of the mitral valve leaflets. Moderate mitral annular calcification. Trivial mitral valve regurgitation. No evidence of mitral valve stenosis. Tricuspid Valve: The tricuspid valve is normal in structure. Tricuspid valve regurgitation is mild . No evidence of tricuspid stenosis. Aortic Valve: The aortic valve is  tricuspid. Aortic valve regurgitation is trivial. No aortic stenosis is present. Pulmonic Valve: The pulmonic valve was normal in structure. Pulmonic valve regurgitation is mild. No evidence of pulmonic stenosis. Aorta: The aortic root is normal in size and structure. Venous: The inferior vena cava is normal in size with greater than 50% respiratory variability, suggesting right atrial pressure of 3 mmHg. IAS/Shunts: The interatrial septum was not well visualized.  LEFT VENTRICLE PLAX 2D LVIDd:         4.55 cm  Diastology LVIDs:         3.64 cm  LV e' lateral:   5.77 cm/s LV PW:         0.98 cm  LV E/e' lateral: 15.2 LV IVS:        1.36 cm  LV e' medial:    6.53 cm/s LVOT diam:     1.80 cm  LV E/e' medial:  13.4 LV SV:         30 LV SV Index:   15 LVOT Area:     2.54 cm  RIGHT VENTRICLE RV S prime:     10.10 cm/s TAPSE (M-mode): 1.3 cm LEFT ATRIUM           Index       RIGHT ATRIUM           Index LA diam:      4.00 cm 2.06 cm/m  RA Area:     11.20 cm LA Vol (A2C): 45.0 ml 23.22 ml/m RA Volume:   18.10 ml  9.34 ml/m LA Vol (A4C): 94.2 ml 48.61 ml/m  AORTIC VALVE LVOT Vmax:   48.00 cm/s LVOT Vmean:  37.900 cm/s LVOT VTI:    0.118 m  AORTA Ao Root diam: 3.30 cm Ao Asc diam:  3.30 cm MITRAL VALVE               TRICUSPID VALVE MV Area (PHT): 3.53 cm    TR Peak grad:   36.7 mmHg MV Decel Time: 215 msec    TR Vmax:        303.00 cm/s MV E velocity: 87.80 cm/s MV A velocity: 91.70 cm/s  SHUNTS MV E/A ratio:  0.96        Systemic VTI:  0.12 m                            Systemic  Diam: 1.80 cm Jenkins Rouge MD Electronically signed by Jenkins Rouge MD Signature Date/Time: 08/13/2019/11:38:28 AM    Final    VAS US CAROTID  Result Date: 08/13/2019 Carotid Arterial Duplex Study Indications:       Syncope. Comparison Study:  no prior Performing Technologist: Abram Sander RVS  Examination Guidelines: A complete evaluation includes B-mode imaging, spectral Doppler, color Doppler, and power Doppler as needed of all  accessible portions of each vessel. Bilateral testing is considered an integral part of a complete examination. Limited examinations for reoccurring indications may be performed as noted.  Right Carotid Findings: +----------+--------+--------+--------+------------------+--------+           PSV cm/sEDV cm/sStenosisPlaque DescriptionComments +----------+--------+--------+--------+------------------+--------+ CCA Prox  86      13              heterogenous               +----------+--------+--------+--------+------------------+--------+ CCA Distal53      11              heterogenous               +----------+--------+--------+--------+------------------+--------+ ICA Prox  45      13      1-39%   heterogenous               +----------+--------+--------+--------+------------------+--------+ ICA Distal65      21                                         +----------+--------+--------+--------+------------------+--------+ ECA       70                                                 +----------+--------+--------+--------+------------------+--------+ +----------+--------+-------+--------+-------------------+           PSV cm/sEDV cmsDescribeArm Pressure (mmHG) +----------+--------+-------+--------+-------------------+ [id number]                                         +----------+--------+-------+--------+-------------------+ +---------+--------+--+--------+--+---------+ VertebralPSV cm/s74EDV cm/s15Antegrade +---------+--------+--+--------+--+---------+  Left Carotid Findings: +----------+--------+--------+--------+------------------+--------+           PSV cm/sEDV cm/sStenosisPlaque DescriptionComments +----------+--------+--------+--------+------------------+--------+ CCA Prox  115     15              heterogenous               +----------+--------+--------+--------+------------------+--------+ CCA Distal50      9               heterogenous                +----------+--------+--------+--------+------------------+--------+ ICA Prox  62      18      1-39%   heterogenous               +----------+--------+--------+--------+------------------+--------+ ICA Distal67      22                                         +----------+--------+--------+--------+------------------+--------+ ECA       54                                                 +----------+--------+--------+--------+------------------+--------+ +----------+--------+--------+--------+-------------------+  PSV cm/sEDV cm/sDescribeArm Pressure (mmHG) +----------+--------+--------+--------+-------------------+ [id number]                                          +----------+--------+--------+--------+-------------------+ +---------+--------+--+--------+-+---------+ VertebralPSV cm/s24EDV cm/s9Antegrade +---------+--------+--+--------+-+---------+   Summary: Right Carotid: Velocities in the right ICA are consistent with a 1-39% stenosis. Left Carotid: Velocities in the left ICA are consistent with a 1-39% stenosis. Vertebrals: Bilateral vertebral arteries demonstrate antegrade flow. *See table(s) above for measurements and observations.  Electronically signed by Antony Contras MD on 08/13/2019 at 1:48:42 PM.    Final         Scheduled Meds: . aspirin EC  81 mg Oral QHS  . carbamide peroxide  5 drop Both EARS BID  . enoxaparin (LOVENOX) injection  40 mg Subcutaneous QHS  . finasteride  5 mg Oral QHS  . gabapentin  300 mg Oral QHS  . lisinopril  10 mg Oral QHS  . metoprolol succinate  25 mg Oral QHS  . ondansetron (ZOFRAN) IV  4 mg Intravenous Once  . rosuvastatin  10 mg Oral Daily  . sertraline  100 mg Oral Daily  . sodium chloride flush  3 mL Intravenous Once  . tamsulosin  0.4 mg Oral QPC supper  . tiZANidine  2 mg Oral QHS   Continuous Infusions: . lactated ringers 100 mL/hr at 08/13/19 0741     LOS: 0 days   Time spent= 35  mins    Keshon Markovitz Arsenio Loader, MD [facility]  If 7PM-7AM, please contact night-coverage  08/13/2019, 2:02 PM

## 2019-08-14 DIAGNOSIS — H8111 Benign paroxysmal vertigo, right ear: Secondary | ICD-10-CM

## 2019-08-14 DIAGNOSIS — H6123 Impacted cerumen, bilateral: Secondary | ICD-10-CM

## 2019-08-14 DIAGNOSIS — R42 Dizziness and giddiness: Secondary | ICD-10-CM

## 2019-08-14 MED ORDER — MECLIZINE HCL 12.5 MG PO TABS: 12.5000 mg | ORAL_TABLET | Freq: Three times a day (TID) | ORAL | 0 refills | Status: DC | PRN

## 2019-08-14 MED ORDER — VITAMIN B-12 1000 MCG PO TABS: 1000.0000 ug | ORAL_TABLET | Freq: Every day | ORAL | 0 refills | Status: AC

## 2019-08-14 MED ORDER — ONDANSETRON 4 MG PO TBDP: 4.0000 mg | ORAL_TABLET | Freq: Three times a day (TID) | ORAL | 0 refills | Status: DC | PRN

## 2019-08-14 NOTE — Progress Notes (Signed)
Physical Therapy Treatment Patient Details Name: Troy Combs MRN: [id number] DOB: July 26, 1931 Today's Date: 08/14/2019    History of Present Illness 86yo male c/o intense vertigo and vomiting x3 weeks in duration. MRI negative for acute CVA. PMH B cataract, HOH, MI, thoracic/lumbar neuritis, cardiac cath, CABG 2002, microdiscectomies    PT Comments    On arrival pt reporting 8/10 HA after walking with RN with decrease to 7/10 during session. Initially stated 3/10 dizziness then revised to none at rest when specifying spinning. Pt with greatly improved vestibular symptoms today with ability to tolerate right dix hallpike with 1/10 dizziness and only 3 sec symptoms with transitional Epley with no dizziness after sitting. Pt with noted cervical and thoracic flexion and OT had educated pt prior to session with pt provided reinforcement during session for exercises, stretching and relaxation with heat applied and soft tissue massage. Pt with continued balance deficits with gait and recommend RW at all times. Pt reports using RW at times but usually upright walker to going to mailbox and no AD in home. D/C plan remains appropriate.    Follow Up Recommendations  Outpatient PT     Equipment Recommendations  None recommended by PT    Recommendations for Other Services       Precautions / Restrictions Precautions Precautions: Fall Precaution Comments: chronic double vision (more vertical than horizontal)    Mobility  Bed Mobility Overal bed mobility: Needs Assistance Bed Mobility: Supine to Sit     Supine to sit: Min assist     General bed mobility comments: min assist to fully elevate trunk from surface with long sitting for vestibular positioning due to neck and back pain, supervision to roll and transition from side to sitting  Transfers Overall transfer level: Needs assistance   Transfers: Sit to/from Stand Sit to Stand: Supervision         General transfer comment:  deferred due to HA   Ambulation/Gait Ambulation/Gait assistance: Min guard Gait Distance (Feet): 120 Feet Assistive device: None Gait Pattern/deviations: Step-through pattern;Decreased stride length   Gait velocity interpretation: >2.62 ft/sec, indicative of community ambulatory General Gait Details: pt with 2 LOB with guarding and tactile cues to correct. Pt able to recognize deficits and agreeable to RW in home for functional use at present   Stairs             Wheelchair Mobility    Modified Rankin (Stroke Patients Only)       Balance Overall balance assessment: Mild deficits observed, not formally tested                                          Cognition Arousal/Alertness: Awake/alert Behavior During Therapy: WFL for tasks assessed/performed Overall Cognitive Status: Within Functional Limits for tasks assessed                                        Exercises     General Comments       Pertinent Vitals/Pain Pain Assessment: 0-10 Faces Pain Scale: Hurts whole lot Pain Location: headache.  Pt with trigger points at occiput  Pain Descriptors / Indicators: Headache Pain Intervention(s): Limited activity within patient's tolerance;Heat applied;Premedicated before session;Repositioned    Home Living Family/patient expects to be discharged to:: Private residence Living Arrangements: Spouse/significant other  Available Help at Discharge: Family;Available 24 hours/day Type of Home: Other(Comment) Home Access: Level entry   Home Layout: One level Home Equipment: Shower seat - built in;Hand held shower head;Grab bars - tub/shower;Other (comment) Additional Comments: also has an upright walker- uses it quite often especially when he goes outside    Prior Function Level of Independence: Independent with assistive device(s)          PT Goals (current goals can now be found in the care plan section) Acute Rehab PT Goals Patient  Stated Goal: to feel better and go home  Progress towards PT goals: Progressing toward goals    Frequency    Min 3X/week      PT Plan Current plan remains appropriate    Co-evaluation              AM-PAC PT "6 Clicks" Mobility   Outcome Measure  Help needed turning from your back to your side while in a flat bed without using bedrails?: None Help needed moving from lying on your back to sitting on the side of a flat bed without using bedrails?: None Help needed moving to and from a bed to a chair (including a wheelchair)?: A Little Help needed standing up from a chair using your arms (e.g., wheelchair or bedside chair)?: A Little Help needed to walk in hospital room?: A Little Help needed climbing 3-5 steps with a railing? : A Little 6 Click Score: 20    End of Session   Activity Tolerance: Patient tolerated treatment well Patient left: in bed;with call bell/phone within reach Nurse Communication: Mobility status PT Visit Diagnosis: Other abnormalities of gait and mobility (R26.89);Unsteadiness on feet (R26.81)     Time: [id number] PT Time Calculation (min) (ACUTE ONLY): 33 min  Charges:  $Gait Training: 8-22 mins $Therapeutic Activity: 8-22 mins                     Nachelle Negrette P, PT Acute Rehabilitation Services Pager: [phone] Office: [place] 08/14/2019, 1:39 PM

## 2019-08-14 NOTE — Care Management (Signed)
Referral placed to [facility] for outpatient vestibular PT. Added to AVS

## 2019-08-14 NOTE — Discharge Summary (Signed)
Physician Discharge Summary  Troy Combs [id number] DOB: 1931/10/11 DOA: 08/12/2019  PCP: Antony Contras, MD  Admit date: 08/12/2019 Discharge date: 08/14/2019  Admitted From: Home Disposition: Home  Recommendations for Outpatient Follow-up:  1. Follow up with PCP in 1-2 weeks 2. Please obtain BMP/CBC in one week your next doctors visit.  3. Meclizine and Zofran prescribed 4. Vitamin B12 supplements   Discharge Condition: Stable CODE STATUS: Full code Diet recommendation: Heart healthy 2 g salt  Brief/Interim Summary: 86 year old with history of CAD, BPPV, BPH, HTN presented with onset of dizziness which has been worsening over past 2 weeks.  Initially also hypertensive in the ER.  MRI of the brain was negative for CVA.  Admitted for concerns of significant amount of dizziness likely from BPPV.  Patient underwent CVA work-up which was negative as described below.  Stable for discharge today as his symptoms are better in terms of dizziness.  Still having some headache but controlled with Tylenol.   Assessment & Plan:   Dizziness likely benign paroxysmal positional vertigo, towards right side Bilateral cerumen impaction -No neurologic/central causes have been identified. -MRI of the brain is negative; No evidence of infection -Orthostatics borderline positive therefore advised him to orally hydrate himself as much as at home.  Tolerating p.o. intake without any issues.  Denied any dizziness this morning -Echocardiogram-EF 60 to [id number], grade 2 diastolic dysfunction -Carotid Dopplers 1-39% bilateral -TSH within normal limits  Vitamin B12 deficiency -Vitamin B12 supplements prescribed  History of coronary artery disease -Currently patient is chest pain-free.  Continue aspirin, lisinopril, metoprolol, Crestor  Hypertensive urgency, improved Essential hypertension -Metoprolol, lisinopril  BPH -BPH   Discharge Diagnoses:  Active Problems:   Mixed hyperlipidemia    BPH associated with nocturia   BPPV (benign paroxysmal positional vertigo), right   Coronary artery disease involving native coronary artery of native heart without angina pectoris   Hypertensive urgency   Impacted cerumen of both ears   Postural dizziness with presyncope    Consultations:  None  Subjective: Feels okay no complaints besides some headache this morning.  Denies any dizziness.  He was able to ambulate with the nursing staff in the hallway without any issues.  Discharge Exam: Vitals:   08/14/19 0822 08/14/19 0825  BP:    Pulse: 70 72  Resp:    Temp:    SpO2:     Vitals:   08/14/19 0819 08/14/19 0820 08/14/19 0822 08/14/19 0825  BP:      Pulse: 61 65 70 72  Resp:      Temp:      TempSrc:      SpO2:      Weight:      Height:        General: Pt is alert, awake, not in acute distress Cardiovascular: RRR, S1/S2 +, no rubs, no gallops Respiratory: CTA bilaterally, no wheezing, no rhonchi Abdominal: Soft, NT, ND, bowel sounds + Extremities: no edema, no cyanosis  Discharge Instructions  Discharge Instructions    Ambulatory referral to Neurology   Complete by: As directed    An appointment is requested in approximately: 1 week   Ambulatory referral to Physical Therapy   Complete by: As directed    vestibular     Allergies as of 08/14/2019      Reactions   Iohexol Other (See Comments)    Code: HIVES, Desc: PATENT STATES HE IS ALLERGIC TO IV DYE 09/14/08/RM, Onset Date: [id number]   Ciprofloxacin Hives, Rash   "Patient  unsure" just know he has a reaction to med Medical record indicates allergic reaction   Other Other (See Comments)   UNSPECIFIED REACTION  Angioplasty dye pt.>> Pt only recalls Had "very bad reaction"      Medication List    TAKE these medications   aspirin EC 81 MG tablet Take 81 mg by mouth at bedtime.   finasteride 5 MG tablet Commonly known as: PROSCAR Take 5 mg by mouth at bedtime.   gabapentin 300 MG capsule Commonly  known as: NEURONTIN Take 300 mg by mouth at bedtime.   hydrALAZINE 25 MG tablet Commonly known as: APRESOLINE Take 1 tablet (25 mg total) by mouth 2 (two) times daily.   lisinopril 5 MG tablet Commonly known as: ZESTRIL Take 1 tablet (5 mg total) by mouth daily.   meclizine 12.5 MG tablet Commonly known as: ANTIVERT Take 1 tablet (12.5 mg total) by mouth 3 (three) times daily as needed for dizziness.   metoprolol succinate 25 MG 24 hr tablet Commonly known as: TOPROL-XL Take 1 tablet (25 mg total) by mouth daily.   nitroGLYCERIN 0.4 MG SL tablet Commonly known as: NITROSTAT Place 1 tablet (0.4 mg total) under the tongue every 5 (five) minutes as needed for chest pain (x 3 doses). Reported on 06/15/2015   ondansetron 4 MG disintegrating tablet Commonly known as: Zofran ODT Take 1 tablet (4 mg total) by mouth every 8 (eight) hours as needed for nausea or vomiting.   rosuvastatin 10 MG tablet Commonly known as: CRESTOR Take 1 tablet (10 mg total) by mouth daily.   sertraline 100 MG tablet Commonly known as: ZOLOFT Take 100 mg by mouth daily.   tamsulosin 0.4 MG Caps capsule Commonly known as: Flomax Take 1 capsule (0.4 mg total) by mouth daily after supper.   tiZANidine 2 MG tablet Commonly known as: ZANAFLEX Take 2 mg by mouth at bedtime.   vitamin B-12 1000 MCG tablet Commonly known as: CYANOCOBALAMIN Take 1 tablet (1,000 mcg total) by mouth daily.      Follow-up Information    Antony Contras, MD. Schedule an appointment as soon as possible for a visit in 1 week(s).   Specialty: Family Medicine Contact information: [place] [address] [phone]        Dorothy Spark, MD .   Specialty: Cardiology Contact information: [address] [place] [id number] [phone]        Deboraha Sprang, MD .   Specialty: Cardiology Contact information: [id number] [place]  [address] [phone]        [facility].   Specialty: Rehabilitation Why: They will contact you in the next week to scjedule physival therapy for your dizziness Contact information: [place] [address] [phone]         Allergies  Allergen Reactions  . Iohexol Other (See Comments)     Code: HIVES, Desc: PATENT STATES HE IS ALLERGIC TO IV DYE 09/14/08/RM, Onset Date: [id number]   . Ciprofloxacin Hives and Rash    "Patient unsure" just know he has a reaction to med Medical record indicates allergic reaction  . Other Other (See Comments)    UNSPECIFIED REACTION  Angioplasty dye pt.>> Pt only recalls Had "very bad reaction"    You were cared for by a hospitalist during your hospital stay. If you have any questions about your discharge medications or the  care you received while you were in the hospital after you are discharged, you can call the unit and asked to speak with the hospitalist on call if the hospitalist that took care of you is not available. Once you are discharged, your primary care physician will handle any further medical issues. Please note that no refills for any discharge medications will be authorized once you are discharged, as it is imperative that you return to your primary care physician (or establish a relationship with a primary care physician if you do not have one) for your aftercare needs so that they can reassess your need for medications and monitor your lab values.   Procedures/Studies: MR Brain Wo Contrast (neuro protocol)  Result Date: 08/12/2019 CLINICAL DATA:  Dizziness, unsteady; ataxia, stroke suspected. Additional history provided: Patient reports dizziness when moving head, symptoms for several weeks. EXAM: MRI HEAD WITHOUT CONTRAST TECHNIQUE: Multiplanar, multiecho pulse sequences of the brain and surrounding structures were obtained without  intravenous contrast. COMPARISON:  Brain MRI 11/07/2016 FINDINGS: Brain: There is no evidence of acute infarct. No evidence of intracranial mass. No midline shift or extra-axial fluid collection. No chronic intracranial blood products. Redemonstrated advanced chronic small vessel ischemic disease within the cerebral white matter, basal ganglia and thalami. To a lesser degree, there are chronic small-vessel ischemic changes within the pons. Stable, moderate generalized parenchymal atrophy. Vascular: Flow voids maintained within the proximal large arterial vessels. Skull and upper cervical spine: No focal marrow lesion. Sinuses/Orbits: Bilateral lens replacements. Trace paranasal sinus mucosal thickening greatest within bilateral ethmoid air cells. No significant mastoid effusion. IMPRESSION: No evidence of acute intracranial abnormality, including acute infarction. Redemonstrated advanced chronic small vessel ischemic changes within the cerebral white matter, basal ganglia and thalami. Mild chronic small vessel ischemic changes are also present within the pons. Stable, moderate generalized parenchymal atrophy. Electronically Signed   By: Kellie Simmering DO   On: 08/12/2019 19:12   ECHOCARDIOGRAM COMPLETE  Result Date: 08/13/2019    ECHOCARDIOGRAM REPORT   Patient Name:   Troy Combs Date of Exam: 08/13/2019 Medical Rec #:  [id number]         Height:       69.0 in Accession #:    [id number]        Weight:       172.0 lb Date of Birth:  Dec 01, 1931         BSA:          1.938 m Patient Age:    31 years          BP:           129/67 mmHg Patient Gender: M                 HR:           60 bpm. Exam Location:  Inpatient Procedure: 2D Echo Indications:    Syncope 780.2 / R55  History:        Patient has prior history of Echocardiogram examinations, most                 recent 08/16/2014. CAD and Previous Myocardial Infarction, Mitral                 Valve Prolapse; Signs/Symptoms:Murmur. GERD, anemia.  Sonographer:     Darlina Sicilian RDCS Referring Phys: [id number] Jaskaran Dauzat CHIRAG Alexya Mcdaris IMPRESSIONS  1. Left ventricular ejection fraction, by estimation, is 60 to 65%. The left ventricle has normal function. The left ventricle has  no regional wall motion abnormalities. There is mild left ventricular hypertrophy. Left ventricular diastolic parameters are consistent with Grade II diastolic dysfunction (pseudonormalization). Elevated left ventricular end-diastolic pressure.  2. Right ventricular systolic function is normal. The right ventricular size is mildly enlarged. There is mildly elevated pulmonary artery systolic pressure.  3. Left atrial size was mildly dilated.  4. The mitral valve is degenerative. Trivial mitral valve regurgitation. No evidence of mitral stenosis.  5. The aortic valve is tricuspid. Aortic valve regurgitation is trivial. No aortic stenosis is present.  6. The inferior vena cava is normal in size with greater than 50% respiratory variability, suggesting right atrial pressure of 3 mmHg. FINDINGS  Left Ventricle: Left ventricular ejection fraction, by estimation, is 60 to 65%. The left ventricle has normal function. The left ventricle has no regional wall motion abnormalities. The left ventricular internal cavity size was normal in size. There is  mild left ventricular hypertrophy. Left ventricular diastolic parameters are consistent with Grade II diastolic dysfunction (pseudonormalization). Elevated left ventricular end-diastolic pressure. Right Ventricle: The right ventricular size is mildly enlarged. No increase in right ventricular wall thickness. Right ventricular systolic function is normal. There is mildly elevated pulmonary artery systolic pressure. The tricuspid regurgitant velocity is 3.03 m/s, and with an assumed right atrial pressure of 3 mmHg, the estimated right ventricular systolic pressure is [id number]. Left Atrium: Left atrial size was mildly dilated. Right Atrium: Right atrial size was normal in size.  Pericardium: There is no evidence of pericardial effusion. Mitral Valve: The mitral valve is degenerative in appearance. There is mild thickening of the mitral valve leaflet(s). There is mild calcification of the mitral valve leaflet(s). Normal mobility of the mitral valve leaflets. Moderate mitral annular calcification. Trivial mitral valve regurgitation. No evidence of mitral valve stenosis. Tricuspid Valve: The tricuspid valve is normal in structure. Tricuspid valve regurgitation is mild . No evidence of tricuspid stenosis. Aortic Valve: The aortic valve is tricuspid. Aortic valve regurgitation is trivial. No aortic stenosis is present. Pulmonic Valve: The pulmonic valve was normal in structure. Pulmonic valve regurgitation is mild. No evidence of pulmonic stenosis. Aorta: The aortic root is normal in size and structure. Venous: The inferior vena cava is normal in size with greater than 50% respiratory variability, suggesting right atrial pressure of 3 mmHg. IAS/Shunts: The interatrial septum was not well visualized.  LEFT VENTRICLE PLAX 2D LVIDd:         4.55 cm  Diastology LVIDs:         3.64 cm  LV e' lateral:   5.77 cm/s LV PW:         0.98 cm  LV E/e' lateral: 15.2 LV IVS:        1.36 cm  LV e' medial:    6.53 cm/s LVOT diam:     1.80 cm  LV E/e' medial:  13.4 LV SV:         30 LV SV Index:   15 LVOT Area:     2.54 cm  RIGHT VENTRICLE RV S prime:     10.10 cm/s TAPSE (M-mode): 1.3 cm LEFT ATRIUM           Index       RIGHT ATRIUM           Index LA diam:      4.00 cm 2.06 cm/m  RA Area:     11.20 cm LA Vol (A2C): 45.0 ml 23.22 ml/m RA Volume:   18.10 ml  9.34  ml/m LA Vol (A4C): 94.2 ml 48.61 ml/m  AORTIC VALVE LVOT Vmax:   48.00 cm/s LVOT Vmean:  37.900 cm/s LVOT VTI:    0.118 m  AORTA Ao Root diam: 3.30 cm Ao Asc diam:  3.30 cm MITRAL VALVE               TRICUSPID VALVE MV Area (PHT): 3.53 cm    TR Peak grad:   36.7 mmHg MV Decel Time: 215 msec    TR Vmax:        303.00 cm/s MV E velocity: 87.80 cm/s  MV A velocity: 91.70 cm/s  SHUNTS MV E/A ratio:  0.96        Systemic VTI:  0.12 m                            Systemic Diam: 1.80 cm Jenkins Rouge MD Electronically signed by Jenkins Rouge MD Signature Date/Time: 08/13/2019/11:38:28 AM    Final    VAS US CAROTID  Result Date: 08/13/2019 Carotid Arterial Duplex Study Indications:       Syncope. Comparison Study:  no prior Performing Technologist: Abram Sander RVS  Examination Guidelines: A complete evaluation includes B-mode imaging, spectral Doppler, color Doppler, and power Doppler as needed of all accessible portions of each vessel. Bilateral testing is considered an integral part of a complete examination. Limited examinations for reoccurring indications may be performed as noted.  Right Carotid Findings: +----------+--------+--------+--------+------------------+--------+           PSV cm/sEDV cm/sStenosisPlaque DescriptionComments +----------+--------+--------+--------+------------------+--------+ CCA Prox  86      13              heterogenous               +----------+--------+--------+--------+------------------+--------+ CCA Distal53      11              heterogenous               +----------+--------+--------+--------+------------------+--------+ ICA Prox  45      13      1-39%   heterogenous               +----------+--------+--------+--------+------------------+--------+ ICA Distal65      21                                         +----------+--------+--------+--------+------------------+--------+ ECA       70                                                 +----------+--------+--------+--------+------------------+--------+ +----------+--------+-------+--------+-------------------+           PSV cm/sEDV cmsDescribeArm Pressure (mmHG) +----------+--------+-------+--------+-------------------+ [id number]                                         +----------+--------+-------+--------+-------------------+  +---------+--------+--+--------+--+---------+ VertebralPSV cm/s74EDV cm/s15Antegrade +---------+--------+--+--------+--+---------+  Left Carotid Findings: +----------+--------+--------+--------+------------------+--------+           PSV cm/sEDV cm/sStenosisPlaque DescriptionComments +----------+--------+--------+--------+------------------+--------+ CCA Prox  115     15              heterogenous               +----------+--------+--------+--------+------------------+--------+  CCA Distal50      9               heterogenous               +----------+--------+--------+--------+------------------+--------+ ICA Prox  62      18      1-39%   heterogenous               +----------+--------+--------+--------+------------------+--------+ ICA Distal67      22                                         +----------+--------+--------+--------+------------------+--------+ ECA       54                                                 +----------+--------+--------+--------+------------------+--------+ +----------+--------+--------+--------+-------------------+           PSV cm/sEDV cm/sDescribeArm Pressure (mmHG) +----------+--------+--------+--------+-------------------+ [id number]                                          +----------+--------+--------+--------+-------------------+ +---------+--------+--+--------+-+---------+ VertebralPSV cm/s24EDV cm/s9Antegrade +---------+--------+--+--------+-+---------+   Summary: Right Carotid: Velocities in the right ICA are consistent with a 1-39% stenosis. Left Carotid: Velocities in the left ICA are consistent with a 1-39% stenosis. Vertebrals: Bilateral vertebral arteries demonstrate antegrade flow. *See table(s) above for measurements and observations.  Electronically signed by Antony Contras MD on 08/13/2019 at 1:48:42 PM.    Final       The results of significant diagnostics from this hospitalization (including  imaging, microbiology, ancillary and laboratory) are listed below for reference.     Microbiology: Recent Results (from the past 240 hour(s))  SARS CORONAVIRUS 2 (TAT 6-24 HRS) Nasopharyngeal Nasopharyngeal Swab     Status: None   Collection Time: 08/12/19  9:20 PM   Specimen: Nasopharyngeal Swab  Result Value Ref Range Status   SARS Coronavirus 2 NEGATIVE NEGATIVE Final    Comment: (NOTE) SARS-CoV-2 target nucleic acids are NOT DETECTED. The SARS-CoV-2 RNA is generally detectable in upper and lower respiratory specimens during the acute phase of infection. Negative results do not preclude SARS-CoV-2 infection, do not rule out co-infections with other pathogens, and should not be used as the sole basis for treatment or other patient management decisions. Negative results must be combined with clinical observations, patient history, and epidemiological information. The expected result is Negative. Fact Sheet for Patients: SugarRoll.be Fact Sheet for Healthcare Providers: https://www.woods-mathews.com/ This test is not yet approved or cleared by the [place] FDA and  has been authorized for detection and/or diagnosis of SARS-CoV-2 by FDA under an Emergency Use Authorization (EUA). This EUA will remain  in effect (meaning this test can be used) for the duration of the COVID-19 declaration under Section 56 4(b)(1) of the Act, 21 U.S.C. section 360bbb-3(b)(1), unless the authorization is terminated or revoked sooner. Performed at [facility], [place] [address] [place], [place] [address]      Labs: BNP (last 3 results) No results for input(s): BNP in the last 8760 hours. Basic Metabolic Panel: Recent Labs  Lab 08/11/19 0947 08/12/19 1340 08/13/19 0247  NA 141 140  --   K 3.9  4.2  --   CL 105 106  --   CO2 22 24  --   GLUCOSE 142* 168*  --   BUN 20 19  --   CREATININE 0.79 0.76  --   CALCIUM 9.5 9.5  --   MG  --   --  2.0    Liver Function Tests: Recent Labs  Lab 08/11/19 0947  AST 24  ALT 26  ALKPHOS 75  BILITOT 0.5  PROT 6.3  ALBUMIN 4.5   No results for input(s): LIPASE, AMYLASE in the last 168 hours. No results for input(s): AMMONIA in the last 168 hours. CBC: Recent Labs  Lab 08/11/19 0947 08/12/19 1340 08/13/19 0247  WBC 10.5 10.1 9.2  NEUTROABS  --   --  6.9  HGB 15.2 15.7 14.0  HCT 43.1 47.8 42.4  MCV 86 88.4 88.0  PLT 153 167 144*   Cardiac Enzymes: No results for input(s): CKTOTAL, CKMB, CKMBINDEX, TROPONINI in the last 168 hours. BNP: Invalid input(s): POCBNP CBG: Recent Labs  Lab 08/12/19 2129  GLUCAP 109*   D-Dimer No results for input(s): DDIMER in the last 72 hours. Hgb A1c Recent Labs    08/13/19 0247  HGBA1C 6.4*   Lipid Profile No results for input(s): CHOL, HDL, LDLCALC, TRIG, CHOLHDL, LDLDIRECT in the last 72 hours. Thyroid function studies Recent Labs    08/13/19 0247  TSH 2.884   Anemia work up Recent Labs    08/13/19 0247  VITAMINB12 153*  161*  FOLATE 10.1   Urinalysis    Component Value Date/Time   COLORURINE YELLOW 08/12/2019 2150   APPEARANCEUR CLEAR 08/12/2019 2150   LABSPEC 1.018 08/12/2019 2150   PHURINE 6.0 08/12/2019 2150   GLUCOSEU NEGATIVE 08/12/2019 2150   GLUCOSEU NEGATIVE 01/24/2016 0955   HGBUR NEGATIVE 08/12/2019 2150   BILIRUBINUR NEGATIVE 08/12/2019 2150   KETONESUR NEGATIVE 08/12/2019 2150   PROTEINUR NEGATIVE 08/12/2019 2150   UROBILINOGEN 2.0 (A) 01/24/2016 0955   NITRITE NEGATIVE 08/12/2019 2150   LEUKOCYTESUR NEGATIVE 08/12/2019 2150   Sepsis Labs Invalid input(s): PROCALCITONIN,  WBC,  LACTICIDVEN Microbiology Recent Results (from the past 240 hour(s))  SARS CORONAVIRUS 2 (TAT 6-24 HRS) Nasopharyngeal Nasopharyngeal Swab     Status: None   Collection Time: 08/12/19  9:20 PM   Specimen: Nasopharyngeal Swab  Result Value Ref Range Status   SARS Coronavirus 2 NEGATIVE NEGATIVE Final    Comment:  (NOTE) SARS-CoV-2 target nucleic acids are NOT DETECTED. The SARS-CoV-2 RNA is generally detectable in upper and lower respiratory specimens during the acute phase of infection. Negative results do not preclude SARS-CoV-2 infection, do not rule out co-infections with other pathogens, and should not be used as the sole basis for treatment or other patient management decisions. Negative results must be combined with clinical observations, patient history, and epidemiological information. The expected result is Negative. Fact Sheet for Patients: SugarRoll.be Fact Sheet for Healthcare Providers: https://www.woods-mathews.com/ This test is not yet approved or cleared by the [place] FDA and  has been authorized for detection and/or diagnosis of SARS-CoV-2 by FDA under an Emergency Use Authorization (EUA). This EUA will remain  in effect (meaning this test can be used) for the duration of the COVID-19 declaration under Section 56 4(b)(1) of the Act, 21 U.S.C. section 360bbb-3(b)(1), unless the authorization is terminated or revoked sooner. Performed at [facility], [place] [address] [place], [place] [address]      Time coordinating discharge:  I have spent 35 minutes  face to face with the patient and on the ward discussing the patients care, assessment, plan and disposition with other care givers. >50% of the time was devoted counseling the patient about the risks and benefits of treatment/Discharge disposition and coordinating care.   SIGNED:   Damita Lack, MD  [facility] 08/14/2019, 11:30 AM   If 7PM-7AM, please contact night-coverage

## 2019-08-14 NOTE — Evaluation (Signed)
Occupational Therapy Evaluation Patient Details Name: Troy Combs MRN: [id number] DOB: [birth year] Today's Date: 08/14/2019    History of Present Illness 86yo male c/o intense vertigo and vomiting x3 weeks in duration. MRI negative for acute CVA. PMH B cataract, HOH, MI, thoracic/lumbar neuritis, cardiac cath, CABG 2002, microdiscectomies   Clinical Impression   Patient evaluated by Occupational Therapy with no further acute OT needs identified. All education has been completed and the patient has no further questions. Pt provided with HEP for head/neck ROM as well as scapular ROM.  All education completed.  See below for any follow-up Occupational Therapy or equipment needs. OT is signing off. Thank you for this referral.        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  He reports he feels confident in his ability to perform ADLs.      Vision Baseline Vision/History: Wears glasses Patient Visual Report: No change from baseline Additional Comments: Pt reports long history of diplopia (vertical) for which he has seen multiple MDs.  He does  demonstrate ptosis which he also reports is not new      Perception     Praxis Praxis Praxis tested?: Within functional limits    Pertinent Vitals/Pain Pain Assessment: 0-10 Faces Pain Scale: Hurts whole lot Pain Location: headache.  Pt with trigger points at occiput  Pain Descriptors / Indicators: Headache Pain Intervention(s): Premedicated before session;Limited activity within patient's tolerance     Hand Dominance Right   Extremity/Trunk Assessment Upper Extremity Assessment Upper Extremity Assessment: Overall WFL for tasks assessed   Lower Extremity Assessment Lower Extremity Assessment: Defer to PT evaluation   Cervical / Trunk Assessment Cervical / Trunk Assessment: Kyphotic   Communication Communication Communication: No difficulties   Cognition Arousal/Alertness: Awake/alert Behavior During Therapy: WFL for tasks assessed/performed Overall Cognitive Status: Within Functional Limits for tasks assessed                                     General Comments  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  He also enjoys  cooking, but reports he has had difficulty standing longer than 5-10 mins due to back pain.  Chart reviewed and pt was seeing OPPT for these issues, but was discharged due to missed treatments.  He also reports he has not followed up on HEP provided to him by OPPT.  He was, however, able to demonstrate the HEP.  Long discussion with him re: need to perform exercises to reduce pain and improve flexibility.  Discussed when he may be able to incorporate exercise into his daily routine and he decided that mornings before he gets up and at night before bed was feasible.  Also added the below neck ROM and scapular exercises to his routine - HEP provided     Exercises Exercises: Other exercises Other Exercises Other Exercises: HEP provided via [employer] [id number] Other Exercises: pt instructed in  cervical rotation, neck lateral flexion, cervical retraction, neck flexion, and scapular adduction.  He performed 10 reps each in supine demonstrating good understanding    Shoulder Instructions      Home Living Family/patient expects to be discharged to:: Private residence Living Arrangements: Spouse/significant other Available Help at Discharge: Family;Available 24 hours/day Type of Home: Other(Comment) Home Access: Level entry     Home Layout: One level     Bathroom Shower/Tub: Occupational psychologist: Handicapped height     Home Equipment: Shower seat - built in;Hand held shower head;Grab bars - tub/shower;Other (comment)   Additional Comments: also has an upright walker- uses it quite often especially when he goes outside      Prior Functioning/Environment Level of Independence: Independent with assistive device(s)                 OT Problem List: Decreased activity tolerance;Impaired balance (sitting and/or standing);Decreased knowledge of use of DME or AE;Pain      OT Treatment/Interventions:      OT Goals(Current goals can be found in the care plan section) Acute Rehab OT Goals Patient Stated Goal: to feel better and go home  OT Goal Formulation: All assessment and education complete, DC therapy  OT Frequency:     Barriers to D/C:            Co-evaluation              AM-PAC OT "6 Clicks" Daily Activity     Outcome Measure Help from another person eating meals?: None Help from another person taking care of personal grooming?: A Little Help from another person toileting, which includes using toliet, bedpan, or urinal?: A Little Help from another person bathing (including washing, rinsing, drying)?: A Little Help from another person to put on and taking off regular upper body clothing?: A Little Help from another person to put on and taking off regular lower body clothing?: A Little 6 Click Score: 19   End of Session Nurse  Communication: Other (comment)(with PT )  Activity Tolerance: Patient tolerated treatment well Patient left: in bed;with call bell/phone within reach  OT Visit Diagnosis: Pain Pain - part of body: (headache )                Time: [id number] OT Time Calculation (min): 54 min Charges:  OT General Charges $OT Visit: 1 Visit OT Evaluation $OT Eval Low Complexity: 1 Low OT Treatments $Self Care/Home Management : 23-37 mins $Therapeutic Exercise: 8-22 mins  Nilsa Nutting., OTR/L Acute Rehabilitation Services Pager [phone] Office [phone]   Lucille Passy M 08/14/2019, 12:13 PM

## 2019-08-14 NOTE — Discharge Instructions (Signed)
Benign Positional Vertigo Vertigo is the feeling that you or your surroundings are moving when they are not. Benign positional vertigo is the most common form of vertigo. This is usually a harmless condition (benign). This condition is positional. This means that symptoms are triggered by certain movements and positions. This condition can be dangerous if it occurs while you are doing something that could cause harm to you or others. This includes activities such as driving or operating machinery. What are the causes? In many cases, the cause of this condition is not known. It may be caused by a disturbance in an area of the inner ear that helps your brain to sense movement and balance. This disturbance can be caused by:  Viral infection (labyrinthitis).  Head injury.  Repetitive motion, such as jumping, dancing, or running. What increases the risk? You are more likely to develop this condition if:  You are a woman.  You are 50 years of age or older. What are the signs or symptoms? Symptoms of this condition usually happen when you move your head or your eyes in different directions. Symptoms may start suddenly, and usually last for less than a minute. They include:  Loss of balance and falling.  Feeling like you are spinning or moving.  Feeling like your surroundings are spinning or moving.  Nausea and vomiting.  Blurred vision.  Dizziness.  Involuntary eye movement (nystagmus). Symptoms can be mild and cause only minor problems, or they can be severe and interfere with daily life. Episodes of benign positional vertigo may return (recur) over time. Symptoms may improve over time. How is this diagnosed? This condition may be diagnosed based on:  Your medical history.  Physical exam of the head, neck, and ears.  Tests, such as: ? MRI. ? CT scan. ? Eye movement tests. Your health care provider may ask you to change positions quickly while he or she watches you for symptoms  of benign positional vertigo, such as nystagmus. Eye movement may be tested with a variety of exams that are designed to evaluate or stimulate vertigo. ? An electroencephalogram (EEG). This records electrical activity in your brain. ? Hearing tests. You may be referred to a health care provider who specializes in ear, nose, and throat (ENT) problems (otolaryngologist) or a provider who specializes in disorders of the nervous system (neurologist). How is this treated?  This condition may be treated in a session in which your health care provider moves your head in specific positions to adjust your inner ear back to normal. Treatment for this condition may take several sessions. Surgery may be needed in severe cases, but this is rare. In some cases, benign positional vertigo may resolve on its own in 2-4 weeks. Follow these instructions at home: Safety  Move slowly. Avoid sudden body or head movements or certain positions, as told by your health care provider.  Avoid driving until your health care provider says it is safe for you to do so.  Avoid operating heavy machinery until your health care provider says it is safe for you to do so.  Avoid doing any tasks that would be dangerous to you or others if vertigo occurs.  If you have trouble walking or keeping your balance, try using a cane for stability. If you feel dizzy or unstable, sit down right away.  Return to your normal activities as told by your health care provider. Ask your health care provider what activities are safe for you. General instructions  Take over-the-counter   and prescription medicines only as told by your health care provider.  Drink enough fluid to keep your urine pale yellow.  Keep all follow-up visits as told by your health care provider. This is important. Contact a health care provider if:  You have a fever.  Your condition gets worse or you develop new symptoms.  Your family or friends notice any  behavioral changes.  You have nausea or vomiting that gets worse.  You have numbness or a "pins and needles" sensation. Get help right away if you:  Have difficulty speaking or moving.  Are always dizzy.  Faint.  Develop severe headaches.  Have weakness in your legs or arms.  Have changes in your hearing or vision.  Develop a stiff neck.  Develop sensitivity to light. Summary  Vertigo is the feeling that you or your surroundings are moving when they are not. Benign positional vertigo is the most common form of vertigo.  The cause of this condition is not known. It may be caused by a disturbance in an area of the inner ear that helps your brain to sense movement and balance.  Symptoms include loss of balance and falling, feeling that you or your surroundings are moving, nausea and vomiting, and blurred vision.  This condition can be diagnosed based on symptoms, physical exam, and other tests, such as MRI, CT scan, eye movement tests, and hearing tests.  Follow safety instructions as told by your health care provider. You will also be told when to contact your health care provider in case of problems. This information is not intended to replace advice given to you by your health care provider. Make sure you discuss any questions you have with your health care provider. Document Revised: 09/24/2017 Document Reviewed: 09/24/2017 [employer] Patient Education  2020 [employer]  

## 2019-08-14 NOTE — Progress Notes (Signed)
Pt discharge education provided at bedside with pt, pt wife, and family  Pt has all belongings  Pt reports dizziness upon dressing, PRN meclizine given, MD aware Pt states "I am ready to go home" Pt IV removed, catheter intact and telemetry removed  Pt discharged via wheelchair with NT

## 2019-08-15 ENCOUNTER — Other Ambulatory Visit: Payer: Self-pay

## 2019-08-15 ENCOUNTER — Encounter (HOSPITAL_COMMUNITY): Payer: Self-pay | Admitting: Emergency Medicine

## 2019-08-15 ENCOUNTER — Emergency Department (HOSPITAL_COMMUNITY)
Admission: EM | Admit: 2019-08-15 | Discharge: 2019-08-18 | Disposition: A | Discharge status: Other Acute Inpatient Hospital | Payer: Medicare Other | Attending: Emergency Medicine | Admitting: Emergency Medicine

## 2019-08-15 DIAGNOSIS — I252 Old myocardial infarction: Secondary | ICD-10-CM | POA: Insufficient documentation

## 2019-08-15 DIAGNOSIS — R42 Dizziness and giddiness: Secondary | ICD-10-CM | POA: Diagnosis not present

## 2019-08-15 DIAGNOSIS — I2510 Atherosclerotic heart disease of native coronary artery without angina pectoris: Secondary | ICD-10-CM | POA: Diagnosis not present

## 2019-08-15 DIAGNOSIS — Z20822 Contact with and (suspected) exposure to covid-19: Secondary | ICD-10-CM | POA: Diagnosis not present

## 2019-08-15 DIAGNOSIS — R112 Nausea with vomiting, unspecified: Secondary | ICD-10-CM | POA: Insufficient documentation

## 2019-08-15 DIAGNOSIS — Z7982 Long term (current) use of aspirin: Secondary | ICD-10-CM | POA: Diagnosis not present

## 2019-08-15 DIAGNOSIS — R531 Weakness: Secondary | ICD-10-CM | POA: Diagnosis present

## 2019-08-15 DIAGNOSIS — Z87891 Personal history of nicotine dependence: Secondary | ICD-10-CM | POA: Insufficient documentation

## 2019-08-15 DIAGNOSIS — Z79899 Other long term (current) drug therapy: Secondary | ICD-10-CM | POA: Diagnosis not present

## 2019-08-15 LAB — URINALYSIS, ROUTINE W REFLEX MICROSCOPIC
Bilirubin Urine: NEGATIVE
Glucose, UA: NEGATIVE mg/dL
Hgb urine dipstick: NEGATIVE
Ketones, ur: NEGATIVE mg/dL
Leukocytes,Ua: NEGATIVE
Nitrite: NEGATIVE
Protein, ur: NEGATIVE mg/dL
Specific Gravity, Urine: 1.015 (ref 1.005–1.030)
pH: 8 (ref 5.0–8.0)

## 2019-08-15 LAB — CBC WITH DIFFERENTIAL/PLATELET
Abs Immature Granulocytes: 0.08 10*3/uL — ABNORMAL HIGH (ref 0.00–0.07)
Basophils Absolute: 0.1 10*3/uL (ref 0.0–0.1)
Basophils Relative: 0 %
Eosinophils Absolute: 0.2 10*3/uL (ref 0.0–0.5)
Eosinophils Relative: 1 %
HCT: 41.9 % (ref 39.0–52.0)
Hemoglobin: 13.7 g/dL (ref 13.0–17.0)
Immature Granulocytes: 1 %
Lymphocytes Relative: 11 %
Lymphs Abs: 1.5 10*3/uL (ref 0.7–4.0)
MCH: 29.5 pg (ref 26.0–34.0)
MCHC: 32.7 g/dL (ref 30.0–36.0)
MCV: 90.1 fL (ref 80.0–100.0)
Monocytes Absolute: 1 10*3/uL (ref 0.1–1.0)
Monocytes Relative: 7 %
Neutro Abs: 10.7 10*3/uL — ABNORMAL HIGH (ref 1.7–7.7)
Neutrophils Relative %: 80 %
Platelets: 127 10*3/uL — ABNORMAL LOW (ref 150–400)
RBC: 4.65 MIL/uL (ref 4.22–5.81)
RDW: 13.5 % (ref 11.5–15.5)
WBC: 13.5 10*3/uL — ABNORMAL HIGH (ref 4.0–10.5)
nRBC: 0 % (ref 0.0–0.2)

## 2019-08-15 LAB — COMPREHENSIVE METABOLIC PANEL
ALT: 23 U/L (ref 0–44)
AST: 23 U/L (ref 15–41)
Albumin: 3.4 g/dL — ABNORMAL LOW (ref 3.5–5.0)
Alkaline Phosphatase: 43 U/L (ref 38–126)
Anion gap: 6 (ref 5–15)
BUN: 15 mg/dL (ref 8–23)
CO2: 27 mmol/L (ref 22–32)
Calcium: 8.8 mg/dL — ABNORMAL LOW (ref 8.9–10.3)
Chloride: 108 mmol/L (ref 98–111)
Creatinine, Ser: 0.8 mg/dL (ref 0.61–1.24)
GFR calc Af Amer: >60 mL/min (ref >60)
GFR calc non Af Amer: >60 mL/min (ref >60)
Glucose, Bld: 112 mg/dL — ABNORMAL HIGH (ref 70–99)
Potassium: 4.2 mmol/L (ref 3.5–5.1)
Sodium: 141 mmol/L (ref 135–145)
Total Bilirubin: 0.9 mg/dL (ref 0.3–1.2)
Total Protein: 5.7 g/dL — ABNORMAL LOW (ref 6.5–8.1)

## 2019-08-15 NOTE — ED Provider Notes (Signed)
Troy Combs Provider Note   CSN: [id number] Arrival date & time: 08/15/19  1431     History Chief Complaint  Patient presents with  . Weakness    Troy Combs is a 86 y.o. male.  HPI Patient presents to the emergency [facility] with continued weakness and dizziness.  The patient was just discharged in the hospital yesterday.  The patient states that he is not sure that he should have been discharged in the hospital.  The patient states that he is was unable to get up from bed to go to the bathroom.  Patient states that he is too weak to walk even with his walker.  Patient states he was able to initially walk from his bedroom to his chair in the living room but was unable to get up.  The patient denies chest pain, shortness of breath, headache,blurred vision, neck pain, fever, cough, numbness, dizziness, anorexia, edema, abdominal pain, nausea, vomiting, diarrhea, rash, back pain, dysuria, hematemesis, bloody stool, near syncope, or syncope.    Past Medical History:  Diagnosis Date  . Allergic rhinitis 09/20/2014  . Anemia, unspecified   . Arthritis   . Bladder neck obstruction   . Cataracts, bilateral   . Coronary atherosclerosis of unspecified type of vessel, native or graft   . Diminished hearing, bilateral   . Dysthymic disorder   . GERD (gastroesophageal reflux disease)   . Heart murmur    as a child  . Impaired glucose tolerance 12/02/2012  . Impotence of organic origin   . Lumbar radiculopathy   . Malignant melanoma of skin of upper limb, including shoulder ([place])   . Old myocardial infarction   . Personal history of malignant melanoma of skin   . Personal history of other diseases of digestive system   . Thoracic or lumbosacral neuritis or radiculitis, unspecified   . Unspecified essential hypertension     Patient Active Problem List   Diagnosis Date Noted  . Postural dizziness with presyncope 08/13/2019  . Hypertensive  urgency 08/12/2019  . Impacted cerumen of both ears 08/12/2019  . Personal history of malignant melanoma of skin   . Lumbar radiculopathy   . Heart murmur   . GERD (gastroesophageal reflux disease)   . Diminished hearing, bilateral   . Cataracts, bilateral   . Bladder neck obstruction   . Arthritis   . Constipation 12/12/2016  . Nausea 12/12/2016  . Coronary artery disease involving native coronary artery of native heart without angina pectoris 11/22/2016  . Dyslipidemia 11/22/2016  . History of coronary artery bypass surgery 11/22/2016  . Depression with anxiety   . Orthostasis 09/20/2016  . HNP (herniated nucleus pulposus), lumbar 02/02/2016  . Spinal stenosis of lumbar region 01/24/2016  . Sciatica, right side 01/24/2016  . Prediabetes 01/24/2016  . Otitis, externa, infective 01/24/2016  . Left lumbar radiculopathy 01/15/2016  . PVC's (premature ventricular contractions) 05/24/2015  . Peripheral polyneuropathy 03/06/2015  . BPPV (benign paroxysmal positional vertigo), right 03/06/2015  . Headache 01/18/2015  . Vertigo 01/18/2015  . Allergic rhinitis 09/20/2014  . Dyspnea on exertion 08/10/2014  . Severe dizziness 08/10/2014  . Diarrhea 05/18/2014  . LLQ pain 05/13/2014  . Chronic meniscal tear of knee 09/22/2013  . Primary localized osteoarthrosis, lower leg 09/22/2013  . Neck pain, bilateral 12/18/2012  . Headache(784.0) 12/18/2012  . Binocular visual disturbance 12/18/2012  . Epiretinal membrane 12/17/2012  . Status post intraocular lens implant 12/17/2012  . Preventative health care 12/02/2012  .  Low back pain 12/02/2012  . Impaired glucose tolerance 12/02/2012  . Palpitations 10/09/2010  . B12 deficiency 08/30/2010  . Hearing loss 08/30/2010  . Tinnitus 08/30/2010  . Neurogenic orthostatic hypotension ([place]) 06/28/2009  . Mixed hyperlipidemia 05/15/2009  . CHEST PAIN UNSPECIFIED 11/23/2008  . Hypotension, unspecified 10/12/2008  . DIZZINESS 10/12/2008  .  Shortness of breath 09/14/2008  . UNSPECIFIED ANEMIA 09/13/2008  . ANXIETY DEPRESSION 09/13/2008  . Old myocardial infarction 09/13/2008  . GASTROESOPHAGEAL REFLUX DISEASE, HX OF 09/13/2008  . LUMBAR RADICULOPATHY, LEFT 08/23/2008  . MELANOMA, SHOULDER 11/27/2006  . Essential hypertension 11/27/2006  . Coronary atherosclerosis 11/27/2006  . BPH associated with nocturia 11/27/2006  . ERECTILE DYSFUNCTION, ORGANIC 11/27/2006    Past Surgical History:  Procedure Laterality Date  . ARTHRODESIS  07/06/2002   of left long finger distal interphalangeal joint with Kirschner wire fixation X 3  . ATRIAL ABLATION SURGERY  05/04/2001   Dr. [name]  . CARDIAC CATHETERIZATION  09/05/2008   Revealing 4 of 4 patent grafts with native multivessel coronary artery disease, EF  of 60% without regional wall motion abnormalities.  Marland Kitchen CATARACT EXTRACTION, BILATERAL    . CHOLECYSTECTOMY    . COLONOSCOPY    . CORONARY ANGIOPLASTY  1993  . CORONARY ARTERY BYPASS GRAFT  10/17/2000   [name]. [name], Troy Combs     AFTER LIVER TRAUMA AND HAND SURGERY  . EYE SURGERY    . HERNIA REPAIR     BILATERAL  . LEFT HEART CATH AND CORS/GRAFTS ANGIOGRAPHY N/A 04/03/2017   Procedure: LEFT HEART CATH AND CORS/GRAFTS ANGIOGRAPHY;  Surgeon: Leonie Man, MD;  Location: [facility];  Service: Cardiovascular;  Laterality: N/A;  . LUMBAR LAMINECTOMY/DECOMPRESSION MICRODISCECTOMY N/A 02/02/2016   Procedure: MICRODISCECTOMY LUMBAR FOUR- LUMBAR FIVE;  Surgeon: Ashok Pall, MD;  Location: [place];  Service: Neurosurgery;  Laterality: N/A;  MICRODISCECTOMY L4-L5  . LUMBAR LAMINECTOMY/DECOMPRESSION MICRODISCECTOMY Right 08/02/2016   Procedure: MICRODISCECTOMY LUMBAR FOUR- LUMBAR FIVE RIGHT;  Surgeon: Ashok Pall, MD;  Location: [place];  Service: Neurosurgery;  Laterality: Right;  . punctured eardrum      to relieve blood build up   for ear infection  . TONSILLECTOMY         Family History    Problem Relation Age of Onset  . Heart disease Father   . Ataxia Neg Hx   . Chorea Neg Hx   . Dementia Neg Hx   . Mental retardation Neg Hx   . Migraines Neg Hx   . Multiple sclerosis Neg Hx   . Neurofibromatosis Neg Hx   . Neuropathy Neg Hx   . Parkinsonism Neg Hx   . Seizures Neg Hx   . Stroke Neg Hx     Social History   Tobacco Use  . Smoking status: Former Smoker    Packs/day: 1.00    Years: 3.00    Pack years: 3.00    Types: Cigarettes    Quit date: 09/21/1955    Years since quitting: 63.9  . Smokeless tobacco: Never Used  Substance Use Topics  . Alcohol use: Yes    Alcohol/week: 0.0 standard drinks    Comment: one or two ounces a week  . Drug use: No    Home Medications Prior to Admission medications   Medication Sig Start Date End Date Taking? Authorizing Provider  aspirin EC 81 MG tablet Take 81 mg by mouth daily after supper.    Yes [provider]  finasteride (PROSCAR) 5 MG tablet Take 5 mg by mouth daily after supper.    Yes [provider]  gabapentin (NEURONTIN) 300 MG capsule Take 300 mg by mouth daily after supper.  06/09/19  Yes [provider]  hydrALAZINE (APRESOLINE) 25 MG tablet Take 1 tablet (25 mg total) by mouth 2 (two) times daily. Patient taking differently: Take 25 mg by mouth 2 (two) times daily as needed (SBP >180).  08/11/19  Yes Imogene Burn, PA-C  lisinopril (ZESTRIL) 5 MG tablet Take 1 tablet (5 mg total) by mouth daily. 08/11/19  Yes Imogene Burn, PA-C  meclizine (ANTIVERT) 12.5 MG tablet Take 1 tablet (12.5 mg total) by mouth 3 (three) times daily as needed for dizziness. 08/14/19  Yes Amin, Jeanella Flattery, MD  metoprolol succinate (TOPROL-XL) 25 MG 24 hr tablet Take 1 tablet (25 mg total) by mouth daily. 08/11/19  Yes Imogene Burn, PA-C  nitroGLYCERIN (NITROSTAT) 0.4 MG SL tablet Place 1 tablet (0.4 mg total) under the tongue every 5 (five) minutes as needed for chest pain (x 3 doses). Reported on  06/15/2015 08/12/17  Yes Dorothy Spark, MD  ondansetron (ZOFRAN ODT) 4 MG disintegrating tablet Take 1 tablet (4 mg total) by mouth every 8 (eight) hours as needed for nausea or vomiting. 08/14/19  Yes Amin, Jeanella Flattery, MD  rosuvastatin (CRESTOR) 10 MG tablet Take 1 tablet (10 mg total) by mouth daily. Patient taking differently: Take 10 mg by mouth daily after supper.  08/11/19  Yes Imogene Burn, PA-C  sertraline (ZOLOFT) 100 MG tablet Take 100 mg by mouth daily. 11/20/18  Yes [provider]  tamsulosin (FLOMAX) 0.4 MG CAPS capsule Take 1 capsule (0.4 mg total) by mouth daily after supper. 09/24/16  Yes Biagio Borg, MD  tiZANidine (ZANAFLEX) 2 MG tablet Take 2 mg by mouth See admin instructions. Take one tablet (2 mg) by mouth every morning, take one tablet (2 mg) later in the day as needed for pain 08/09/19  Yes [provider]  vitamin B-12 (CYANOCOBALAMIN) 1000 MCG tablet Take 1 tablet (1,000 mcg total) by mouth daily. 08/14/19 11/12/19 Yes Amin, Jeanella Flattery, MD    Allergies    Iohexol, Ciprofloxacin, and Other  Review of Systems   Review of Systems All other systems negative except as documented in the HPI. All pertinent positives and negatives as reviewed in the HPI. Physical Exam Updated Vital Signs BP (!) 141/74   Pulse (!) 56   Temp 98.7 F (37.1 C) (Oral)   Resp 18   Ht 5\' 9"  (1.753 m)   Wt 78 kg   SpO2 96%   BMI 25.39 kg/m   Physical Exam Vitals and nursing note reviewed.  Constitutional:      General: He is not in acute distress.    Appearance: He is well-developed.  HENT:     Head: Normocephalic and atraumatic.  Eyes:     Pupils: Pupils are equal, round, and reactive to light.  Cardiovascular:     Rate and Rhythm: Normal rate and regular rhythm.     Heart sounds: Normal heart sounds. No murmur. No friction rub. No gallop.   Pulmonary:     Effort: Pulmonary effort is normal. No respiratory distress.     Breath sounds: Normal breath  sounds. No wheezing.  Abdominal:     General: Bowel sounds are normal. There is no distension.     Palpations: Abdomen is soft.     Tenderness: There  is no abdominal tenderness.  Musculoskeletal:     Cervical back: Normal range of motion and neck supple.  Skin:    General: Skin is warm and dry.     Capillary Refill: Capillary refill takes less than 2 seconds.     Findings: No erythema or rash.  Neurological:     Mental Status: He is alert and oriented to person, place, and time.     Motor: No abnormal muscle tone.     Coordination: Coordination normal.  Psychiatric:        Behavior: Behavior normal.     ED Results / Procedures / Treatments   Labs (all labs ordered are listed, but only abnormal results are displayed) Labs Reviewed  COMPREHENSIVE METABOLIC PANEL - Abnormal; Notable for the following components:      Result Value   Glucose, Bld 112 (*)    Calcium 8.8 (*)    Total Protein 5.7 (*)    Albumin 3.4 (*)    All other components within normal limits  CBC WITH DIFFERENTIAL/PLATELET - Abnormal; Notable for the following components:   WBC 13.5 (*)    Platelets 127 (*)    Neutro Abs 10.7 (*)    Abs Immature Granulocytes 0.08 (*)    All other components within normal limits  URINALYSIS, ROUTINE W REFLEX MICROSCOPIC    EKG EKG Interpretation  Date/Time:  Sunday August 15 2019 14:43:29 EDT Ventricular Rate:  59 PR Interval:    QRS Duration: 76 QT Interval:  418 QTC Calculation: 415 R Axis:   -15 Text Interpretation: Sinus rhythm Inferior infarct, old Confirmed by Fredia Sorrow [phone]) on 08/15/2019 4:43:58 PM   Radiology No results found.  Procedures Procedures (including critical care time)  Medications Ordered in ED Medications - No data to display  ED Course  I have reviewed the triage vital signs and the nursing notes.  Pertinent labs & imaging results that were available during my care of the patient were reviewed by me and considered in my  medical decision making (see chart for details).    MDM Rules/Calculators/A&P                      The patient will most likely need admission I will speak with the [facility] hospitalist.  I had a discussion with the [facility] hospitalist and he reviewed the patient's testing and felt that the patient did not need admission to the hospital.  I continue to state my concerns about the patient going home and he declined admission for the patient.  I have feel that he will need social work to the help with placement or intensive home health type scenario.  I do not feel he is safe to go home in the current state. Final Clinical Impression(s) / ED Diagnoses Final diagnoses:  Weakness  Dizziness  Nausea and vomiting, intractability of vomiting not specified, unspecified vomiting type    Rx / DC Orders ED Discharge Orders    None       Dalia Heading, PA-C 08/15/19 2348    Fredia Sorrow, MD 08/18/19 [phone]

## 2019-08-15 NOTE — ED Triage Notes (Signed)
Pt BIB [facility] from home. Pt complaining of progressive weakness and decreased appetite. Pt A&Ox4. VSS. NAD.

## 2019-08-16 MED ORDER — ASPIRIN EC 81 MG PO TBEC
81.0000 mg | DELAYED_RELEASE_TABLET | Freq: Every day | ORAL | Status: DC
  Administered 2019-08-16 – 2019-08-17 (×2): 81 mg via ORAL
  Filled 2019-08-16 (×2): qty 1

## 2019-08-16 MED ORDER — GABAPENTIN 300 MG PO CAPS
300.0000 mg | ORAL_CAPSULE | Freq: Every day | ORAL | Status: DC
  Administered 2019-08-16 – 2019-08-17 (×2): 300 mg via ORAL
  Filled 2019-08-16 (×2): qty 1

## 2019-08-16 MED ORDER — TAMSULOSIN HCL 0.4 MG PO CAPS
0.4000 mg | ORAL_CAPSULE | Freq: Every day | ORAL | Status: DC
  Administered 2019-08-16 – 2019-08-17 (×2): 0.4 mg via ORAL
  Filled 2019-08-16 (×2): qty 1

## 2019-08-16 MED ORDER — METOPROLOL SUCCINATE ER 25 MG PO TB24
25.0000 mg | ORAL_TABLET | Freq: Every day | ORAL | Status: DC
  Administered 2019-08-16: 25 mg via ORAL
  Filled 2019-08-16 (×2): qty 1

## 2019-08-16 MED ORDER — LISINOPRIL 2.5 MG PO TABS
5.0000 mg | ORAL_TABLET | Freq: Every day | ORAL | Status: DC
  Administered 2019-08-16 – 2019-08-18 (×3): 5 mg via ORAL
  Filled 2019-08-16 (×3): qty 2

## 2019-08-16 MED ORDER — NITROGLYCERIN 0.4 MG SL SUBL: 0.4000 mg | SUBLINGUAL_TABLET | SUBLINGUAL | Status: DC | PRN

## 2019-08-16 MED ORDER — VITAMIN B-12 1000 MCG PO TABS
1000.0000 ug | ORAL_TABLET | Freq: Every day | ORAL | Status: DC
  Administered 2019-08-16 – 2019-08-18 (×3): 1000 ug via ORAL
  Filled 2019-08-16 (×3): qty 1

## 2019-08-16 MED ORDER — MECLIZINE HCL 25 MG PO TABS: 12.5000 mg | ORAL_TABLET | Freq: Three times a day (TID) | ORAL | Status: DC | PRN

## 2019-08-16 MED ORDER — ONDANSETRON 4 MG PO TBDP: 4.0000 mg | ORAL_TABLET | Freq: Three times a day (TID) | ORAL | Status: DC | PRN

## 2019-08-16 MED ORDER — ROSUVASTATIN CALCIUM 5 MG PO TABS
10.0000 mg | ORAL_TABLET | Freq: Every day | ORAL | Status: DC
  Administered 2019-08-16 – 2019-08-17 (×2): 10 mg via ORAL
  Filled 2019-08-16 (×2): qty 2

## 2019-08-16 MED ORDER — TIZANIDINE HCL 4 MG PO TABS
2.0000 mg | ORAL_TABLET | ORAL | Status: DC
  Filled 2019-08-16: qty 1

## 2019-08-16 MED ORDER — HYDRALAZINE HCL 25 MG PO TABS: 25.0000 mg | ORAL_TABLET | Freq: Two times a day (BID) | ORAL | Status: DC | PRN

## 2019-08-16 MED ORDER — SERTRALINE HCL 100 MG PO TABS
100.0000 mg | ORAL_TABLET | Freq: Every day | ORAL | Status: DC
  Administered 2019-08-16 – 2019-08-18 (×3): 100 mg via ORAL
  Filled 2019-08-16 (×3): qty 1

## 2019-08-16 MED ORDER — FINASTERIDE 5 MG PO TABS
5.0000 mg | ORAL_TABLET | Freq: Every day | ORAL | Status: DC
  Administered 2019-08-16 – 2019-08-17 (×2): 5 mg via ORAL
  Filled 2019-08-16 (×3): qty 1

## 2019-08-16 NOTE — Evaluation (Addendum)
Occupational Therapy Evaluation Patient Details Name: Troy Combs MRN: [id number] DOB: 11/08/1931 Today's Date: 08/16/2019    History of Present Illness Pt is an 86 y/o male presenting to the ED secondary to weakness and inability to care for himself. Pt recently discharged from hospital secondary to weakness and dizziness. PMH includes CAD s/p CABG, BPPV, melanoma, and back surgery.    Clinical Impression   This 86 y/o male presents with the above. Pt with recent ED visit and d/c home, now reports increased weakness and decrease in mobility status since discharge home. Pt tolerating functional mobility with overall minA (+2, via HHA), requiring minA for LB ADL and setup/minguard assist for seated UB ADL. Pt and pt's daughter (who was present during session) both expressing concerns of returning home/pt managing on his own given continued weakness, decreased mobility. Pt will benefit from continued acute OT services and currently recommend follow up therapy services in SNF setting after discharge to maximize his overall safety and independence with ADL and mobility. Will follow.     Follow Up Recommendations  SNF;Supervision/Assistance - 24 hour    Equipment Recommendations  Other (comment)(TBD)           Precautions / Restrictions Precautions Precautions: Fall Precaution Comments: chronic double vision (more vertical than horizontal) Restrictions Weight Bearing Restrictions: No      Mobility Bed Mobility Overal bed mobility: Needs Assistance Bed Mobility: Supine to Sit;Sit to Supine     Supine to sit: Mod assist Sit to supine: Min guard   General bed mobility comments: Mod A for trunk assist to come to sitting. Increased time required. Min guard for safety to return to supine.   Transfers Overall transfer level: Needs assistance Equipment used: 2 person hand held assist Transfers: Sit to/from Stand Sit to Stand: Min assist;+2 physical assistance;+2  safety/equipment         General transfer comment: HHA and min A +2 for lift assist and steadying to stand.     Balance Overall balance assessment: Needs assistance Sitting-balance support: Feet supported;No upper extremity supported Sitting balance-Leahy Scale: Good     Standing balance support: Bilateral upper extremity supported;During functional activity Standing balance-Leahy Scale: Poor Standing balance comment: Reliant on BUE support                            ADL either performed or assessed with clinical judgement   ADL Overall ADL's : Needs assistance/impaired Eating/Feeding: Modified independent;Bed level   Grooming: Set up;Sitting   Upper Body Bathing: Set up;Min guard;Sitting   Lower Body Bathing: Minimal assistance;Sit to/from stand   Upper Body Dressing : Set up;Min guard;Sitting   Lower Body Dressing: Minimal assistance;Sit to/from stand Lower Body Dressing Details (indicate cue type and reason): pt able to don socks via figure 4, minA (+2) for sit<>stand Toilet Transfer: Minimal assistance;+2 for safety/equipment;Ambulation Toilet Transfer Details (indicate cue type and reason): simulated via transfer to/from EOB Toileting- Clothing Manipulation and Hygiene: Minimal assistance;Sit to/from stand       Functional mobility during ADLs: Minimal assistance;+2 for physical assistance;+2 for safety/equipment(HHA)                           Pertinent Vitals/Pain Pain Assessment: Faces Faces Pain Scale: Hurts even more Pain Location: headache after walking Pain Descriptors / Indicators: Headache Pain Intervention(s): Limited activity within patient's tolerance;Monitored during session;Repositioned     Hand Dominance Right  Extremity/Trunk Assessment Upper Extremity Assessment Upper Extremity Assessment: Generalized weakness   Lower Extremity Assessment Lower Extremity Assessment: Defer to PT evaluation RLE Deficits / Details:  Reports "weak knee" on the R at baseline    Cervical / Trunk Assessment Cervical / Trunk Assessment: Kyphotic(forward head)   Communication Communication Communication: No difficulties   Cognition Arousal/Alertness: Awake/alert Behavior During Therapy: WFL for tasks assessed/performed Overall Cognitive Status: Within Functional Limits for tasks assessed                                     General Comments  Pt's daughter present during session (reports in from [place]). Concerned about pt going home as he is unable to care for himself.     Exercises     Shoulder Instructions      Home Living Family/patient expects to be discharged to:: Private residence Living Arrangements: Spouse/significant other Available Help at Discharge: Family Type of Home: House Home Access: Level entry     Home Layout: One level     Bathroom Shower/Tub: Occupational psychologist: Handicapped height     Home Equipment: Shower seat - built in;Hand held shower head;Grab bars - tub/shower;Other (comment);Walker - 2 wheels(has upright walker and walking stick)   Additional Comments: Pt reports wife recently broke wrist and is unable to assist pt.       Prior Functioning/Environment Level of Independence: Independent        Comments: Normally independent with mobility.         OT Problem List: Decreased strength;Decreased range of motion;Decreased activity tolerance;Impaired balance (sitting and/or standing);Decreased safety awareness;Decreased knowledge of use of DME or AE;Pain      OT Treatment/Interventions: Self-care/ADL training;Neuromuscular education;Energy conservation;DME and/or AE instruction;Therapeutic activities;Patient/family education;Balance training    OT Goals(Current goals can be found in the care plan section) Acute Rehab OT Goals Patient Stated Goal: to go home OT Goal Formulation: With patient Time For Goal Achievement: 08/30/19 Potential to  Achieve Goals: Good  OT Frequency: Min 2X/week   Barriers to D/C:            Co-evaluation PT/OT/SLP Co-Evaluation/Treatment: Yes Reason for Co-Treatment: For patient/therapist safety;To address functional/ADL transfers PT goals addressed during session: Mobility/safety with mobility;Balance OT goals addressed during session: ADL's and self-care      AM-PAC OT "6 Clicks" Daily Activity     Outcome Measure Help from another person eating meals?: None Help from another person taking care of personal grooming?: A Little Help from another person toileting, which includes using toliet, bedpan, or urinal?: A Little Help from another person bathing (including washing, rinsing, drying)?: A Little Help from another person to put on and taking off regular upper body clothing?: A Little Help from another person to put on and taking off regular lower body clothing?: A Little 6 Click Score: 19   End of Session Equipment Utilized During Treatment: Gait belt Nurse Communication: Mobility status  Activity Tolerance: Patient tolerated treatment well Patient left: in bed;with call bell/phone within reach;with bed alarm set;with family/visitor present  OT Visit Diagnosis: Muscle weakness (generalized) (M62.81);Unsteadiness on feet (R26.81)                Time: 1123-1140 OT Time Calculation (min): 17 min Charges:  OT General Charges $OT Visit: 1 Visit OT Evaluation $OT Eval Moderate Complexity: [facility] [place], OT Acute Rehabilitation Services Pager [phone] Office [phone]  Raymondo Band 08/16/2019, 1:58 PM

## 2019-08-16 NOTE — Evaluation (Signed)
Physical Therapy Evaluation Patient Details Name: Troy Combs MRN: [id number] DOB: 05/23/1931 Today's Date: 08/16/2019   History of Present Illness  Pt is an 86 y/o male presenting to the ED secondary to weakness and inability to care for himself. Pt recently discharged from hospital secondary to weakness and dizziness. PMH includes CAD s/p CABG, BPPV, melanoma, and back surgery.   Clinical Impression  Pt admitted secondary to problem above with deficits below. Pt requiring min A +2 and bilateral HHA for gait and transfers. Required mod A for bed mobility. Pt reports inability to care for himself since leaving hospital and reports wife is unable to assist secondary to broken wrist. Feel pt is at increased risk for falls. Will continue to follow acutely to maximize functional mobility independence and safety.     Follow Up Recommendations SNF    Equipment Recommendations  None recommended by PT    Recommendations for Other Services       Precautions / Restrictions Precautions Precautions: Fall Precaution Comments: chronic double vision (more vertical than horizontal) Restrictions Weight Bearing Restrictions: No      Mobility  Bed Mobility Overal bed mobility: Needs Assistance Bed Mobility: Supine to Sit;Sit to Supine     Supine to sit: Mod assist Sit to supine: Min guard   General bed mobility comments: Mod A for trunk assist to come to sitting. Increased time required. Min guard for safety to return to supine.   Transfers Overall transfer level: Needs assistance Equipment used: 2 person hand held assist Transfers: Sit to/from Stand Sit to Stand: Min assist;+2 physical assistance;+2 safety/equipment         General transfer comment: HHA and min A +2 for lift assist and steadying to stand.   Ambulation/Gait Ambulation/Gait assistance: Min assist;+2 physical assistance Gait Distance (Feet): 50 Feet Assistive device: 2 person hand held assist Gait  Pattern/deviations: Step-through pattern;Decreased stride length Gait velocity: Decreased   General Gait Details: Pt reporting weakness in BLE with gait. Min A +2 for steadying. Pt reports headache started during ambulation.   Stairs            Wheelchair Mobility    Modified Rankin (Stroke Patients Only)       Balance Overall balance assessment: Needs assistance Sitting-balance support: Feet supported;No upper extremity supported Sitting balance-Leahy Scale: Good     Standing balance support: Bilateral upper extremity supported;During functional activity Standing balance-Leahy Scale: Poor Standing balance comment: Reliant on BUE support                              Pertinent Vitals/Pain Pain Assessment: Faces Faces Pain Scale: Hurts even more Pain Location: headache after walking Pain Descriptors / Indicators: Headache Pain Intervention(s): Limited activity within patient's tolerance;Monitored during session;Repositioned    Home Living Family/patient expects to be discharged to:: Private residence Living Arrangements: Spouse/significant other Available Help at Discharge: Family Type of Home: House Home Access: Level entry     Home Layout: One level Home Equipment: Shower seat - built in;Hand held shower head;Grab bars - tub/shower;Other (comment);Walker - 2 wheels(has upright walker and walking stick) Additional Comments: Pt reports wife recently broke wrist and is unable to assist pt.     Prior Function Level of Independence: Independent         Comments: Normally independent with mobility.      Hand Dominance        Extremity/Trunk Assessment   Upper Extremity Assessment Upper  Extremity Assessment: Defer to OT evaluation    Lower Extremity Assessment Lower Extremity Assessment: Generalized weakness;RLE deficits/detail RLE Deficits / Details: Reports "weak knee" on the R at baseline     Cervical / Trunk Assessment Cervical /  Trunk Assessment: Kyphotic(forward head)  Communication   Communication: No difficulties  Cognition Arousal/Alertness: Awake/alert Behavior During Therapy: WFL for tasks assessed/performed Overall Cognitive Status: Within Functional Limits for tasks assessed                                        General Comments General comments (skin integrity, edema, etc.): Pt's daughter present during session. Concerned about pt going home as he is unable to care for himself.     Exercises     Assessment/Plan    PT Assessment Patient needs continued PT services  PT Problem List Decreased strength;Decreased activity tolerance;Decreased balance;Decreased mobility;Pain       PT Treatment Interventions DME instruction;Balance training;Gait training;Functional mobility training;Therapeutic activities;Therapeutic exercise;Patient/family education;Other (comment)    PT Goals (Current goals can be found in the Care Plan section)  Acute Rehab PT Goals Patient Stated Goal: to go home PT Goal Formulation: With patient Time For Goal Achievement: 08/30/19 Potential to Achieve Goals: Good    Frequency Min 2X/week   Barriers to discharge Other (comment) Wife unable to physically assist     Co-evaluation PT/OT/SLP Co-Evaluation/Treatment: Yes Reason for Co-Treatment: To address functional/ADL transfers;For patient/therapist safety PT goals addressed during session: Mobility/safety with mobility;Balance         AM-PAC PT "6 Clicks" Mobility  Outcome Measure Help needed turning from your back to your side while in a flat bed without using bedrails?: A Little Help needed moving from lying on your back to sitting on the side of a flat bed without using bedrails?: A Lot Help needed moving to and from a bed to a chair (including a wheelchair)?: A Little Help needed standing up from a chair using your arms (e.g., wheelchair or bedside chair)?: A Little Help needed to walk in hospital  room?: A Little Help needed climbing 3-5 steps with a railing? : A Lot 6 Click Score: 16    End of Session Equipment Utilized During Treatment: Gait belt Activity Tolerance: Patient tolerated treatment well Patient left: in bed;with call bell/phone within reach;with bed alarm set;with family/visitor present Nurse Communication: Mobility status PT Visit Diagnosis: Unsteadiness on feet (R26.81);Muscle weakness (generalized) (M62.81)    Time: [id number] PT Time Calculation (min) (ACUTE ONLY): 18 min   Charges:   PT Evaluation $PT Eval Moderate Complexity: 1 Mod          Reuel Derby, PT, DPT  Acute Rehabilitation Services  Pager: [phone] Office: [phone]   Rudean Hitt 08/16/2019, 1:19 PM

## 2019-08-16 NOTE — ED Notes (Signed)
Rechecked pulse ox with another dinamap, sats 91% on RA. Pt in no distress, just finished breakfast and is resting. Will continue to monitor

## 2019-08-16 NOTE — Discharge Planning (Signed)
Licensed Clinical Social Worker is seeking post-discharge placement for this patient at the following level of care: SNF    

## 2019-08-16 NOTE — ED Notes (Signed)
[facility].

## 2019-08-16 NOTE — ED Provider Notes (Signed)
86 year old male received a signout from [employer] lawyer pending social work, PT, and OT evaluation.  Per his HPI  "Troy Combs is a 86 y.o. male.  HPI Patient presents to the emergency department with continued weakness and dizziness.  The patient was just discharged in the hospital yesterday.  The patient states that he is not sure that he should have been discharged in the hospital.  The patient states that he is was unable to get up from bed to go to the bathroom.  Patient states that he is too weak to walk even with his walker.  Patient states he was able to initially walk from his bedroom to his chair in the living room but was unable to get up.  The patient denies chest pain, shortness of breath, headache,blurred vision, neck pain, fever, cough, numbness, dizziness, anorexia, edema, abdominal pain, nausea, vomiting, diarrhea, rash, back pain, dysuria, hematemesis, bloody stool, near syncope, or syncope."  Physical Exam  BP (!) 143/73   Pulse (!) 58   Temp 98.7 F (37.1 C) (Oral)   Resp 14   Ht 5\' 9"  (1.753 m)   Wt 78 kg   SpO2 95%   BMI 25.39 kg/m   Physical Exam Vitals and nursing note reviewed.  Constitutional:      Appearance: He is well-developed.     Comments: Sleeping.  There are equal, unlabored respirations.  HENT:     Head: Normocephalic and atraumatic.  Pulmonary:     Effort: Pulmonary effort is normal.  Musculoskeletal:     Cervical back: Neck supple.  Psychiatric:        Behavior: Behavior normal.     ED Course/Procedures     Procedures  MDM  86 year old male received in signout from [employer] pending social work consult as well as PT and OT evaluation.  The patient was previously seen by Dr. Bobby Rumpf, attending physician.  Please see PA Lawyer's note for further work-up and medical decision making.  In brief, the patient was recently discharged from the hospital earlier this week.  Since returning home, he has been extremely weak and dizzy and having  difficulty getting out of bed or out of his chair.  There is concern about his safety at home due to his decline in activities of daily living.  He had an extensive work-up for CVA, which was unremarkable.  PT and OT as well as transitional care consult has been placed.    His home medications have been ordered.  Patient care transferred to PA Whiteface at the end of my shift to follow-up on PT, OT, and transitions of care recommendations. Patient presentation, ED course, and plan of care discussed with review of all pertinent labs and imaging. Please see his/her note for further details regarding further ED course and disposition.       Joline Maxcy A, PA-C 08/16/19 [id number]    Fredia Sorrow, MD 08/23/19 [phone]

## 2019-08-16 NOTE — ED Notes (Signed)
Lunch Tray Ordered @ 1737. 

## 2019-08-16 NOTE — ED Notes (Signed)
Breakfast ordered 

## 2019-08-16 NOTE — ED Notes (Signed)
Lunch Tray Ordered @ 1111. 

## 2019-08-16 NOTE — NC FL2 (Addendum)
[place] LEVEL OF CARE SCREENING TOOL     IDENTIFICATION  Patient Name: Troy Combs Birthdate: 01-Aug-1931 Sex: male Admission Date (Current Location): 08/15/2019  [facility] and [place] Number:  Herbalist and Address:  [facility], [place] [address], [place], [place] [address]      Provider Number: [id number]  Attending Physician Name and Address:  Default, Provider, MD  Relative Name and Phone Number:       Current Level of Care: Hospital Recommended Level of Care: [place] Prior Approval Number:    Date Approved/Denied:   PASRR Number: [id number]  Discharge Plan: SNF    Current Diagnoses: Patient Active Problem List   Diagnosis Date Noted  . Postural dizziness with presyncope 08/13/2019  . Hypertensive urgency 08/12/2019  . Impacted cerumen of both ears 08/12/2019  . Personal history of malignant melanoma of skin   . Lumbar radiculopathy   . Heart murmur   . GERD (gastroesophageal reflux disease)   . Diminished hearing, bilateral   . Cataracts, bilateral   . Bladder neck obstruction   . Arthritis   . Constipation 12/12/2016  . Nausea 12/12/2016  . Coronary artery disease involving native coronary artery of native heart without angina pectoris 11/22/2016  . Dyslipidemia 11/22/2016  . History of coronary artery bypass surgery 11/22/2016  . Depression with anxiety   . Orthostasis 09/20/2016  . HNP (herniated nucleus pulposus), lumbar 02/02/2016  . Spinal stenosis of lumbar region 01/24/2016  . Sciatica, right side 01/24/2016  . Prediabetes 01/24/2016  . Otitis, externa, infective 01/24/2016  . Left lumbar radiculopathy 01/15/2016  . PVC's (premature ventricular contractions) 05/24/2015  . Peripheral polyneuropathy 03/06/2015  . BPPV (benign paroxysmal positional vertigo), right 03/06/2015  . Headache 01/18/2015  . Vertigo 01/18/2015  . Allergic rhinitis 09/20/2014  . Dyspnea on exertion  08/10/2014  . Severe dizziness 08/10/2014  . Diarrhea 05/18/2014  . LLQ pain 05/13/2014  . Chronic meniscal tear of knee 09/22/2013  . Primary localized osteoarthrosis, lower leg 09/22/2013  . Neck pain, bilateral 12/18/2012  . Headache(784.0) 12/18/2012  . Binocular visual disturbance 12/18/2012  . Epiretinal membrane 12/17/2012  . Status post intraocular lens implant 12/17/2012  . Preventative health care 12/02/2012  . Low back pain 12/02/2012  . Impaired glucose tolerance 12/02/2012  . Palpitations 10/09/2010  . B12 deficiency 08/30/2010  . Hearing loss 08/30/2010  . Tinnitus 08/30/2010  . Neurogenic orthostatic hypotension ([place]) 06/28/2009  . Mixed hyperlipidemia 05/15/2009  . CHEST PAIN UNSPECIFIED 11/23/2008  . Hypotension, unspecified 10/12/2008  . DIZZINESS 10/12/2008  . Shortness of breath 09/14/2008  . UNSPECIFIED ANEMIA 09/13/2008  . ANXIETY DEPRESSION 09/13/2008  . Old myocardial infarction 09/13/2008  . GASTROESOPHAGEAL REFLUX DISEASE, HX OF 09/13/2008  . LUMBAR RADICULOPATHY, LEFT 08/23/2008  . MELANOMA, SHOULDER 11/27/2006  . Essential hypertension 11/27/2006  . Coronary atherosclerosis 11/27/2006  . BPH associated with nocturia 11/27/2006  . ERECTILE DYSFUNCTION, ORGANIC 11/27/2006    Orientation RESPIRATION BLADDER Height & Weight     Self, Time, Situation, Place  Normal Continent Weight: 171 lb 15.3 oz (78 kg) Height:  5\' 9"  (175.3 cm)  BEHAVIORAL SYMPTOMS/MOOD NEUROLOGICAL BOWEL NUTRITION STATUS      Continent Diet  AMBULATORY STATUS COMMUNICATION OF NEEDS Skin   Limited Assist Verbally Normal                       Personal Care Assistance Level of Assistance  Bathing, Feeding, Dressing  Bathing Assistance: Limited assistance Feeding assistance: Independent Dressing Assistance: Limited assistance     Functional Limitations Info  Speech, Sight, Hearing Sight Info: Adequate Hearing Info: Adequate Speech Info: Adequate    SPECIAL CARE  FACTORS FREQUENCY  PT (By licensed PT), OT (By licensed OT)     PT Frequency: 5x weekly OT Frequency: 5x weekly            Contractures Contractures Info: Not present    Additional Factors Info  Allergies   Allergies Info: Lohexol, Ciprofloxacin           Current Medications (08/16/2019):  This is the current hospital active medication list Current Facility-Administered Medications  Medication Dose Route Frequency Provider Last Rate Last Admin  . aspirin EC tablet 81 mg  81 mg Oral QPC supper McDonald, Mia A, PA-C      . finasteride (PROSCAR) tablet 5 mg  5 mg Oral QPC supper McDonald, Mia A, PA-C      . gabapentin (NEURONTIN) capsule 300 mg  300 mg Oral QPC supper McDonald, Mia A, PA-C      . hydrALAZINE (APRESOLINE) tablet 25 mg  25 mg Oral BID PRN McDonald, Mia A, PA-C      . lisinopril (ZESTRIL) tablet 5 mg  5 mg Oral Daily McDonald, Mia A, PA-C   5 mg at 08/16/19 1024  . meclizine (ANTIVERT) tablet 12.5 mg  12.5 mg Oral TID PRN McDonald, Mia A, PA-C      . metoprolol succinate (TOPROL-XL) 24 hr tablet 25 mg  25 mg Oral Daily McDonald, Mia A, PA-C   25 mg at 08/16/19 1025  . nitroGLYCERIN (NITROSTAT) SL tablet 0.4 mg  0.4 mg Sublingual Q5 min PRN McDonald, Mia A, PA-C      . ondansetron (ZOFRAN-ODT) disintegrating tablet 4 mg  4 mg Oral Q8H PRN McDonald, Mia A, PA-C      . rosuvastatin (CRESTOR) tablet 10 mg  10 mg Oral QPC supper McDonald, Mia A, PA-C      . sertraline (ZOLOFT) tablet 100 mg  100 mg Oral Daily McDonald, Mia A, PA-C   100 mg at 08/16/19 1024  . tamsulosin (FLOMAX) capsule 0.4 mg  0.4 mg Oral QPC supper McDonald, Mia A, PA-C      . tiZANidine (ZANAFLEX) tablet 2 mg  2 mg Oral See admin instructions McDonald, Mia A, PA-C      . vitamin B-12 (CYANOCOBALAMIN) tablet 1,000 mcg  1,000 mcg Oral Daily McDonald, Mia A, PA-C   1,000 mcg at 08/16/19 1025   Current Outpatient Medications  Medication Sig Dispense Refill  . aspirin EC 81 MG tablet Take 81 mg by mouth  daily after supper.     . finasteride (PROSCAR) 5 MG tablet Take 5 mg by mouth daily after supper.     . gabapentin (NEURONTIN) 300 MG capsule Take 300 mg by mouth daily after supper.     . hydrALAZINE (APRESOLINE) 25 MG tablet Take 1 tablet (25 mg total) by mouth 2 (two) times daily. (Patient taking differently: Take 25 mg by mouth 2 (two) times daily as needed (SBP >180). ) 180 tablet 3  . lisinopril (ZESTRIL) 5 MG tablet Take 1 tablet (5 mg total) by mouth daily. 90 tablet 3  . meclizine (ANTIVERT) 12.5 MG tablet Take 1 tablet (12.5 mg total) by mouth 3 (three) times daily as needed for dizziness. 30 tablet 0  . metoprolol succinate (TOPROL-XL) 25 MG 24 hr tablet Take 1 tablet (25 mg total)  by mouth daily. 90 tablet 3  . nitroGLYCERIN (NITROSTAT) 0.4 MG SL tablet Place 1 tablet (0.4 mg total) under the tongue every 5 (five) minutes as needed for chest pain (x 3 doses). Reported on 06/15/2015 25 tablet 6  . ondansetron (ZOFRAN ODT) 4 MG disintegrating tablet Take 1 tablet (4 mg total) by mouth every 8 (eight) hours as needed for nausea or vomiting. 30 tablet 0  . rosuvastatin (CRESTOR) 10 MG tablet Take 1 tablet (10 mg total) by mouth daily. (Patient taking differently: Take 10 mg by mouth daily after supper. ) 90 tablet 3  . sertraline (ZOLOFT) 100 MG tablet Take 100 mg by mouth daily.    . tamsulosin (FLOMAX) 0.4 MG CAPS capsule Take 1 capsule (0.4 mg total) by mouth daily after supper. 90 capsule 3  . tiZANidine (ZANAFLEX) 2 MG tablet Take 2 mg by mouth See admin instructions. Take one tablet (2 mg) by mouth every morning, take one tablet (2 mg) later in the day as needed for pain    . vitamin B-12 (CYANOCOBALAMIN) 1000 MCG tablet Take 1 tablet (1,000 mcg total) by mouth daily. 90 tablet 0     Discharge Medications: Please see discharge summary for a list of discharge medications.  Relevant Imaging Results:  Relevant Lab Results:   Additional Information SSN: [id number]  Archie Endo, LCSW

## 2019-08-16 NOTE — Progress Notes (Addendum)
2pm: CSW spoke with Claiborne Billings at [facility] who states there are no beds available due to facility construction at this time.  8am: CSW spoke with patient at bedside to complete assessment for possible SNF placement. CSW explained to patient that PT would come and evaluate him to determine his level of need, he stated understanding. Patient reports he lives at home with his wife. Patient reports his wife is unable to assist him in mobilizing due to her having recently broken her wrist. Patient reports he was at [facility] for 3 weeks approximately 5 years ago after having back surgery. Patient is agreeable to return to [facility] if they have a bed available.   CSW will wait for PT evaluation before proceeding with placement.  Madilyn Fireman, MSW, LCSW-A Transitions of Care  Clinical Social Worker  [facility] Emergency Departments  Medical ICU [phone]

## 2019-08-16 NOTE — ED Notes (Addendum)
Pt. Had a bed-wetting episode; he stated that he missed the urinal when trying to urinate. Linen and clothing changed, peri care provided, barrier cream applied to reddening area between thighs and peri area, Pt. Repositioned.

## 2019-08-16 NOTE — ED Notes (Signed)
Pt's daughter visiting at bedside

## 2019-08-17 LAB — RESPIRATORY PANEL BY RT PCR (FLU A&B, COVID)
Influenza A by PCR: NEGATIVE
Influenza B by PCR: NEGATIVE
SARS Coronavirus 2 by RT PCR: NEGATIVE

## 2019-08-17 NOTE — Progress Notes (Signed)
CSW received call from [facility] with authorization code at 6:30pm  [id number]

## 2019-08-17 NOTE — ED Notes (Signed)
Daughter at bedside.

## 2019-08-17 NOTE — ED Notes (Signed)
Discussed POC with patient. Informed placement had been found. CM at bedside. Plan to transport pt next morning.

## 2019-08-17 NOTE — Progress Notes (Addendum)
1:30pm: CSW spoke with Beverely Low at [facility] who states this patient's insurance authorization is still under review at this time.  8:35am: CSW spoke with patient's wife [name] to inform her of bed offer from [facility] is agreeable.  CSW initiated insurance authorization from [employer].  CSW spoke with Jacqlyn Larsen, RN to request patient receive a COVID test ASAP for discharge.   7:50am: CSW reached out to Nepal at [facility] and Vincent at [facility] to request a review of this patient.  Madilyn Fireman, MSW, LCSW-A Transitions of Care  Clinical Social Worker  [facility] Emergency Departments  Medical ICU [phone]

## 2019-08-17 NOTE — Discharge Planning (Signed)
Licensed Clinical Social Worker is seeking post-discharge placement for this patient at the following level of care: SNF    

## 2019-08-17 NOTE — ED Notes (Signed)
Light dimmed per pt request so he may "take a nap". Daughter has left.

## 2019-08-18 NOTE — ED Notes (Signed)
Placement AM  bfast ordered

## 2019-08-18 NOTE — ED Provider Notes (Signed)
Patient is stable for discharge.   Virgel Manifold, MD 08/18/19 [phone]

## 2019-08-18 NOTE — Progress Notes (Addendum)
1:45pm: The patient will go to room 112. The number to call for report is [phone]. CSW arranged for transportation via [facility] for first available.   9:15am: CSW sent patient's negative COVID results and AVS to [facility].  8am: CSW spoke with Nepal at [facility] who states the patient's wife did not complete the required paperwork yesterday. CSW coordinated with patient's daughter Izora Gala via phone for a time for documents to be completed. The appointment is for 1:30pm today.  After all documentation is signed, Lexine Baton will inform CSW of bed assignment information and then the patient can be discharged and transported to the facility via [facility].  Madilyn Fireman, MSW, LCSW-A Transitions of Care  Clinical Social Worker  [facility] Emergency Departments  Medical ICU [phone]

## 2019-08-18 NOTE — ED Provider Notes (Signed)
  Physical Exam  BP 120/60 (BP Location: Right Arm)   Pulse 60   Temp 98.2 F (36.8 C) (Oral)   Resp 16   Ht 5\' 9"  (1.753 m)   Wt 78 kg   SpO2 91%   BMI 25.39 kg/m   Physical Exam  ED Course/Procedures     Procedures  MDM  Patient has been medically cleared but is awaiting placement in nursing home after paperwork is filled out by his wife.  No events overnight      Kristine Royal 08/18/19 1213    Virgel Manifold, MD 08/18/19 1418

## 2019-08-19 ENCOUNTER — Non-Acute Institutional Stay (SKILLED_NURSING_FACILITY): Payer: Medicare Other | Admitting: Internal Medicine

## 2019-08-19 ENCOUNTER — Encounter: Payer: Self-pay | Admitting: Internal Medicine

## 2019-08-19 DIAGNOSIS — I25810 Atherosclerosis of coronary artery bypass graft(s) without angina pectoris: Secondary | ICD-10-CM | POA: Diagnosis not present

## 2019-08-19 DIAGNOSIS — H8111 Benign paroxysmal vertigo, right ear: Secondary | ICD-10-CM | POA: Diagnosis not present

## 2019-08-19 DIAGNOSIS — I10 Essential (primary) hypertension: Secondary | ICD-10-CM | POA: Diagnosis not present

## 2019-08-19 DIAGNOSIS — R42 Dizziness and giddiness: Secondary | ICD-10-CM | POA: Diagnosis not present

## 2019-08-19 DIAGNOSIS — G629 Polyneuropathy, unspecified: Secondary | ICD-10-CM

## 2019-08-19 LAB — CBC AND DIFFERENTIAL
HCT: 44 (ref 41–53)
Hemoglobin: 14.8 (ref 13.5–17.5)
Neutrophils Absolute: 7
Platelets: 149 — AB (ref 150–399)
WBC: 9.7

## 2019-08-19 LAB — COMPREHENSIVE METABOLIC PANEL
Calcium: 8.9 (ref 8.7–10.7)
GFR calc Af Amer: 90
GFR calc non Af Amer: 90

## 2019-08-19 LAB — BASIC METABOLIC PANEL
BUN: 14 (ref 4–21)
CO2: 19 (ref 13–22)
Chloride: 106 (ref 99–108)
Creatinine: 0.6 (ref 0.6–1.3)
Glucose: 117
Potassium: 4.2 (ref 3.4–5.3)
Sodium: 141 (ref 137–147)

## 2019-08-19 LAB — CBC: RBC: 5.02 (ref 3.87–5.11)

## 2019-08-19 NOTE — Progress Notes (Signed)
Location:    [facility] Room Number: 112/P Place of Service:  SNF (31) Provider:  Clarene Critchley, MD  Patient Care Team: Antony Contras, MD as PCP - General (Family Medicine) Dorothy Spark, MD as PCP - Cardiology (Cardiology) Deboraha Sprang, MD as PCP - Electrophysiology (Cardiology) Verl Blalock Marijo Conception, MD (Inactive) (Cardiology)  Extended Emergency Contact Information Primary Emergency Contact: Darrol Jump Address: [address], [place] [address] Johnnette Litter of [place] Phone: [phone] Mobile Phone: [phone] Relation: Spouse Secondary Emergency Contact: Sherrye Payor [place] of [place] Phone: [phone] Relation: Daughter  Code Status:  DNR Goals of care: Advanced Directive information Advanced Directives 08/19/2019  Does Patient Have a Medical Advance Directive? Yes  Type of Advance Directive Out of facility DNR (pink MOST or yellow form)  Does patient want to make changes to medical advance directive? No - Patient declined  Copy of [place] in Chart? -  Would patient like information on creating a medical advance directive? -  Pre-existing out of facility DNR order (yellow form or pink MOST form) Yellow form placed in chart (order not valid for inpatient use)     Chief Complaint  Patient presents with  . Hospitalization Follow-up    Hospitalization Follow Up  Status post hospital follow-up for dizziness and weakness.    HPI:  Pt is a 86 y.o. male seen today for a hospital f/u for dizziness and weakness-patient apparently was recently hospitalized for those complaints and work-up for CVA was negative carotid Doppler showed 1 to 39% bilateral stenosis.  His TSH was normal.  This was thought to be worsening BPV any was discharged on Antivert 12.5 mg 3 times a day as needed.  Apparently patient went home but did not do well-apparently had difficulty  ambulating with significant weakness in presented back to the ER.  It was thought he was probably not suitable to go back home but would need more intensive therapy and monitoring and thus he has been admitted now to skilled nursing for short-term rehab.  Currently he is resting in bed comfortably-apparently he has had some nausea with movement and does have order for Zofran which were prescribed in the hospital-this nausea is more so when he gets up and ambulate he states.  In regards to his other diagnosis his B12 level was found to be borderline low in the hospital and this is being supplemented.  He also has a history of coronary artery disease and continues on aspirin as well as Toprol-XL lisinopril and Crestor.  He is also on Toprol lisinopril and hydralazine for hypertension.  At this point vital signs appear to be stable systolics appear to run in the 130s to 140s range we have fairly minimal readings.  He is afebrile appears to be in good spirits  Past Medical History:  Diagnosis Date  . Allergic rhinitis 09/20/2014  . Anemia, unspecified   . Arthritis   . Bladder neck obstruction   . Cataracts, bilateral   . Coronary atherosclerosis of unspecified type of vessel, native or graft   . Diminished hearing, bilateral   . Dysthymic disorder   . GERD (gastroesophageal reflux disease)   . Heart murmur    as a child  . Impaired glucose tolerance 12/02/2012  . Impotence of organic origin   . Lumbar radiculopathy   . Malignant melanoma of skin of upper limb, including shoulder ([place])   .  Old myocardial infarction   . Personal history of malignant melanoma of skin   . Personal history of other diseases of digestive system   . Thoracic or lumbosacral neuritis or radiculitis, unspecified   . Unspecified essential hypertension    Past Surgical History:  Procedure Laterality Date  . ARTHRODESIS  07/06/2002   of left long finger distal interphalangeal joint with Kirschner wire fixation  X 3  . ATRIAL ABLATION SURGERY  05/04/2001   Dr. [name]  . CARDIAC CATHETERIZATION  09/05/2008   Revealing 4 of 4 patent grafts with native multivessel coronary artery disease, EF  of 60% without regional wall motion abnormalities.  Marland Kitchen CATARACT EXTRACTION, BILATERAL    . CHOLECYSTECTOMY    . COLONOSCOPY    . CORONARY ANGIOPLASTY  1993  . CORONARY ARTERY BYPASS GRAFT  10/17/2000   [name]. [name], [place]     AFTER LIVER TRAUMA AND HAND SURGERY  . EYE SURGERY    . HERNIA REPAIR     BILATERAL  . LEFT HEART CATH AND CORS/GRAFTS ANGIOGRAPHY N/A 04/03/2017   Procedure: LEFT HEART CATH AND CORS/GRAFTS ANGIOGRAPHY;  Surgeon: Leonie Man, MD;  Location: [facility];  Service: Cardiovascular;  Laterality: N/A;  . LUMBAR LAMINECTOMY/DECOMPRESSION MICRODISCECTOMY N/A 02/02/2016   Procedure: MICRODISCECTOMY LUMBAR FOUR- LUMBAR FIVE;  Surgeon: Ashok Pall, MD;  Location: [facility];  Service: Neurosurgery;  Laterality: N/A;  MICRODISCECTOMY L4-L5  . LUMBAR LAMINECTOMY/DECOMPRESSION MICRODISCECTOMY Right 08/02/2016   Procedure: MICRODISCECTOMY LUMBAR FOUR- LUMBAR FIVE RIGHT;  Surgeon: Ashok Pall, MD;  Location: [facility];  Service: Neurosurgery;  Laterality: Right;  . punctured eardrum      to relieve blood build up   for ear infection  . TONSILLECTOMY      Allergies  Allergen Reactions  . Iohexol Other (See Comments)     Code: HIVES, Desc: PATENT STATES HE IS ALLERGIC TO IV DYE 09/14/08/RM, Onset Date: [id number]   . Ciprofloxacin Hives and Rash    "Patient unsure" just know he has a reaction to med Medical record indicates allergic reaction  . Other Other (See Comments)    UNSPECIFIED REACTION  Angioplasty dye pt.>> Pt only recalls Had "very bad reaction"    Allergies as of 08/19/2019      Reactions   Iohexol Other (See Comments)    Code: HIVES, Desc: PATENT STATES HE IS ALLERGIC TO IV DYE 09/14/08/RM, Onset Date: [id number]   Ciprofloxacin Hives, Rash    "Patient unsure" just know he has a reaction to med Medical record indicates allergic reaction   Other Other (See Comments)   UNSPECIFIED REACTION  Angioplasty dye pt.>> Pt only recalls Had "very bad reaction"      Medication List       Accurate as of August 19, 2019  2:45 PM. If you have any questions, ask your nurse or doctor.        aspirin EC 81 MG tablet Take 81 mg by mouth daily after supper.   finasteride 5 MG tablet Commonly known as: PROSCAR Take 5 mg by mouth daily after supper.   gabapentin 300 MG capsule Commonly known as: NEURONTIN Take 300 mg by mouth daily after supper.   hydrALAZINE 25 MG tablet Commonly known as: APRESOLINE Take 1 tablet (25 mg total) by mouth 2 (two) times daily.   lisinopril 5 MG tablet Commonly known as: ZESTRIL Take 1 tablet (5 mg total) by mouth daily.   meclizine 12.5 MG tablet Commonly known  as: ANTIVERT Take 1 tablet (12.5 mg total) by mouth 3 (three) times daily as needed for dizziness.   metoprolol succinate 25 MG 24 hr tablet Commonly known as: TOPROL-XL Take 1 tablet (25 mg total) by mouth daily.   nitroGLYCERIN 0.4 MG SL tablet Commonly known as: NITROSTAT Place 1 tablet (0.4 mg total) under the tongue every 5 (five) minutes as needed for chest pain (x 3 doses). Reported on 06/15/2015   NON FORMULARY DIET :Regular NAS   ondansetron 4 MG disintegrating tablet Commonly known as: Zofran ODT Take 1 tablet (4 mg total) by mouth every 8 (eight) hours as needed for nausea or vomiting.   rosuvastatin 10 MG tablet Commonly known as: CRESTOR Take 1 tablet (10 mg total) by mouth daily.   sertraline 100 MG tablet Commonly known as: ZOLOFT Take 100 mg by mouth daily.   tamsulosin 0.4 MG Caps capsule Commonly known as: Flomax Take 1 capsule (0.4 mg total) by mouth daily after supper.   tiZANidine 2 MG tablet Commonly known as: ZANAFLEX Take 2 mg by mouth See admin instructions. Take one tablet (2 mg) by mouth every  morning, take one tablet (2 mg) later in the day as needed for pain   vitamin B-12 1000 MCG tablet Commonly known as: CYANOCOBALAMIN Take 1 tablet (1,000 mcg total) by mouth daily.       Review of Systems In general he has no complaints of fever or chills.  Skin does not complain of rashes or itching.  Head ears eyes nose mouth and throat is not complain of visual changes or sore throat.  Respiratory no complaints of shortness of breath or cough.  Cardiac does not complain of chest pain or concerning edema.  GI does have some nausea when he ambulates he states does not complain of any vomiting abdominal pain constipation.  GU is not complaining of any dysuria.  Musculoskeletal does have weakness and will benefit from rehab he is not really complaining of pain he does apparently at times have some muscle spasm discomfort and is on Zanaflex.  Neurologic again positive for dizziness is not complaining of headache or syncope.  Psych does have a history of depression but appears to be in very good spirits not complain of being depressed or anxious this morning.   Immunization History  Administered Date(s) Administered  . Influenza, High Dose Seasonal PF 02/25/2013, 03/28/2018  . Influenza,inj,Quad PF,6+ Mos 01/20/2014, 01/18/2015, 01/24/2016  . PFIZER SARS-COV-2 Vaccination 06/25/2019, 07/21/2019  . Pneumococcal Conjugate-13 02/03/2014  . Pneumococcal Polysaccharide-23 02/11/2006  . Zoster Recombinat (Shingrix) 04/03/2018   Pertinent  Health Maintenance Due  Topic Date Due  . INFLUENZA VACCINE  11/28/2019  . PNA vac Low Risk Adult  Completed   Fall Risk  05/01/2018 09/19/2017 06/04/2017 11/13/2016 10/23/2016  Falls in the past year? 0 No Yes No No  Number falls in past yr: 0 - 1 - -  Injury with Fall? 0 - Yes - -  Risk Factor Category  - - High Fall Risk - -   Functional Status Survey:    Vitals:   08/19/19 1438  BP: 130/79  Pulse: 64  Resp: 17  Temp: 97.6 F (36.4 C)    TempSrc: Oral  SpO2: 98%  Weight: 164 lb 9.6 oz (74.7 kg)  Height: 5\' 9"  (1.753 m)   Body mass index is 24.31 kg/m. Physical Exam General this is a pleasant elderly male in no distress resting comfortably in bed.  The skin is warm and  dry.  Eyes visual acuity appears to be intact sclera and conjunctive are clear.  Oropharynx clear and mucous membranes moist.  Chest is clear to auscultation there is no labored breathing.  Heart is regular rate and rhythm without murmur gallop or rub he does not have significant lower extremity edema.  Abdomen is soft nontender with positive bowel sounds.  Musculoskeletal Limited exam since he is in bed but appears able to move all extremities x4 at baseline.  Neurologic appears grossly intact his speech is clear cannot appreciate lateralizing findings.  Psych he is alert and oriented very pleasant and appropriate.   Labs reviewed:  August 19, 2019.  WBC 9.7 hemoglobin 14.8 platelets 149.  Sodium 141 potassium 4.2 BUN 14.3 creatinine 0.56.   Recent Labs    08/11/19 0947 08/12/19 1340 08/13/19 0247 08/15/19 1725  NA 141 140  --  141  K 3.9 4.2  --  4.2  CL 105 106  --  108  CO2 22 24  --  27  GLUCOSE 142* 168*  --  112*  BUN 20 19  --  15  CREATININE 0.79 0.76  --  0.80  CALCIUM 9.5 9.5  --  8.8*  MG  --   --  2.0  --    Recent Labs    01/14/19 1457 08/11/19 0947 08/15/19 1725  AST 19 24 23   ALT 34 26 23  ALKPHOS 65 75 43  BILITOT 0.8 0.5 0.9  PROT 6.7 6.3 5.7*  ALBUMIN 4.9* 4.5 3.4*   Recent Labs    01/14/19 1457 08/11/19 0947 08/12/19 1340 08/13/19 0247 08/15/19 1725  WBC 15.8*   < > 10.1 9.2 13.5*  NEUTROABS 11.7*  --   --  6.9 10.7*  HGB 17.2   < > 15.7 14.0 13.7  HCT 48.7   < > 47.8 42.4 41.9  MCV 87   < > 88.4 88.0 90.1  PLT 200   < > 167 144* 127*   < > = values in this interval not displayed.   Lab Results  Component Value Date   TSH 2.884 08/13/2019   Lab Results  Component Value Date    HGBA1C 6.4 (H) 08/13/2019   Lab Results  Component Value Date   CHOL 110 01/24/2016   HDL 49.70 01/24/2016   LDLCALC 34 01/24/2016   TRIG 133.0 01/24/2016   CHOLHDL 2 01/24/2016    Significant Diagnostic Results in last 30 days:  No results found.  Assessment/Plan upper  #1 history of dizziness thought secondary to BPV-CVA was ruled out.  Initial hospitalization-lab work was unremarkable including TSH.  Carotid Dopplers show 1-39% bilateral stenosis.  He does have orders for Antivert 12.5 mg 3 times daily as needed at this point will monitor he will need PT and OT.  In regards to nausea he does have orders for as needed Zofran 4 mg every 8 hours as needed-he appears to benefit from this-at this point will monitor.  2.  History of B12 deficiency level was 1.53 in the hospital he is on supplementation-1000 mcg a day at this point will monitor.  3.  History of coronary artery disease he continues on aspirin 81 mg a day as well as Toprol-XL 25 mg a day-lisinopril 5 mg a day and Crestor 10 mg a day.  He does not really complain of any concerning edema or chest pain at this point will monitor.  4.  History of hypertension he continues on Toprol-XL 25 mg a  day as well as lisinopril 5 mg a day and hydralazine 25 mg twice a day-systolics have been in the 05/29/1936 range although we have fairly minimal readings at this point will monitor.  5.  History of muscle spasms pain-she does have orders for Zanaflex and can have this twice a day he says he has been on this and tolerated it well and it does help.  6.  History of depression this appears quite stable on Zoloft 100 mg a day appear to be in good spirits bright alert optimistic today.  7.  History of neuropathy he is on Neurontin 300 mg a day at this point will monitor.  8.  History of BPH he continues on Flomax 0.4 mg a day and Proscar 5 mg a day he does not have any complaints in that regard today will monitor.  [id number] note  greater than 35 minutes spent assessing patient reviewing his chart and labs and coordinating and formulating a plan of care for numerous diagnoses-of note greater than 50% of time spent coordinating a plan of care with input as noted above

## 2019-08-20 ENCOUNTER — Non-Acute Institutional Stay (SKILLED_NURSING_FACILITY): Payer: Medicare Other | Admitting: Internal Medicine

## 2019-08-20 ENCOUNTER — Encounter: Payer: Self-pay | Admitting: Internal Medicine

## 2019-08-20 DIAGNOSIS — I10 Essential (primary) hypertension: Secondary | ICD-10-CM | POA: Diagnosis not present

## 2019-08-20 DIAGNOSIS — H8111 Benign paroxysmal vertigo, right ear: Secondary | ICD-10-CM | POA: Diagnosis not present

## 2019-08-20 DIAGNOSIS — E538 Deficiency of other specified B group vitamins: Secondary | ICD-10-CM

## 2019-08-20 DIAGNOSIS — N401 Enlarged prostate with lower urinary tract symptoms: Secondary | ICD-10-CM

## 2019-08-20 DIAGNOSIS — R351 Nocturia: Secondary | ICD-10-CM

## 2019-08-20 DIAGNOSIS — E785 Hyperlipidemia, unspecified: Secondary | ICD-10-CM

## 2019-08-20 DIAGNOSIS — I2510 Atherosclerotic heart disease of native coronary artery without angina pectoris: Secondary | ICD-10-CM

## 2019-08-20 NOTE — Progress Notes (Signed)
: Provider:  Hennie Duos., MD Location:  [facility] Room Number: 112-P Place of Service:  SNF ([phone])  PCP: Antony Contras, MD Patient Care Team: Antony Contras, MD as PCP - General (Family Medicine) Dorothy Spark, MD as PCP - Cardiology (Cardiology) Deboraha Sprang, MD as PCP - Electrophysiology (Cardiology) Verl Blalock Marijo Conception, MD (Inactive) (Cardiology)  Extended Emergency Contact Information Primary Emergency Contact: Darrol Jump Address: [address] [place] [address] [place] of [place] Phone: [phone] Mobile Phone: [phone] Relation: Spouse Secondary Emergency Contact: Reganess,Dianne  [place] Phone: [phone] Relation: Daughter     Allergies: Iohexol, Ciprofloxacin, and Other  Chief Complaint  Patient presents with  . New Admit To SNF    New admission to [facility]    HPI: Patient is an 86 y.o. male with CAD, BPPV, BPH, hypertension who presented to the ED with the onset of dizziness which had worsened over the prior 2 weeks.  Initially was hypertensive in the ED.  MRI of the brain was negative for CVA.  Admitted on 4/15-17 [facility] for concerns of significant amount of dizziness, likely from BPPV.  Patient underwent CVA work-up which was negative.  In the hospital patient had headaches which were controlled by Tylenol.  Patient was discharged to home, where he did not do well and is admitted to [facility] and rehab on 4/21 for OT/PT while at skilled nursing facility patient will be followed for hypertension treated with metoprolol lisinopril and hydralazine, BPH treated with Proscar and Flomax and coronary artery disease treated with metoprolol and aspirin, and statin.   Past Medical History:  Diagnosis Date  . Allergic rhinitis 09/20/2014  . Anemia, unspecified   . Arthritis   . Bladder neck obstruction   . Cataracts, bilateral   . Coronary  atherosclerosis of unspecified type of vessel, native or graft   . Diminished hearing, bilateral   . Dysthymic disorder   . GERD (gastroesophageal reflux disease)   . Heart murmur    as a child  . Impaired glucose tolerance 12/02/2012  . Impotence of organic origin   . Lumbar radiculopathy   . Malignant melanoma of skin of upper limb, including shoulder ([place])   . Old myocardial infarction   . Personal history of malignant melanoma of skin   . Personal history of other diseases of digestive system   . Thoracic or lumbosacral neuritis or radiculitis, unspecified   . Unspecified essential hypertension     Past Surgical History:  Procedure Laterality Date  . ARTHRODESIS  07/06/2002   of left long finger distal interphalangeal joint with Kirschner wire fixation X 3  . ATRIAL ABLATION SURGERY  05/04/2001   Dr. [name]  . CARDIAC CATHETERIZATION  09/05/2008   Revealing 4 of 4 patent grafts with native multivessel coronary artery disease, EF  of 60% without regional wall motion abnormalities.  Marland Kitchen CATARACT EXTRACTION, BILATERAL    . CHOLECYSTECTOMY    . COLONOSCOPY    . CORONARY ANGIOPLASTY  1993  . CORONARY ARTERY BYPASS GRAFT  10/17/2000   [name]. [name], [place]     AFTER LIVER TRAUMA AND HAND SURGERY  . EYE SURGERY    . HERNIA REPAIR     BILATERAL  . LEFT HEART CATH AND CORS/GRAFTS ANGIOGRAPHY N/A 04/03/2017   Procedure: LEFT HEART CATH AND CORS/GRAFTS ANGIOGRAPHY;  Surgeon:  Leonie Man, MD;  Location: [facility];  Service: Cardiovascular;  Laterality: N/A;  . LUMBAR LAMINECTOMY/DECOMPRESSION MICRODISCECTOMY N/A 02/02/2016   Procedure: MICRODISCECTOMY LUMBAR FOUR- LUMBAR FIVE;  Surgeon: Ashok Pall, MD;  Location: [facility];  Service: Neurosurgery;  Laterality: N/A;  MICRODISCECTOMY L4-L5  . LUMBAR LAMINECTOMY/DECOMPRESSION MICRODISCECTOMY Right 08/02/2016   Procedure: MICRODISCECTOMY LUMBAR FOUR- LUMBAR FIVE RIGHT;  Surgeon: Ashok Pall, MD;   Location: [facility];  Service: Neurosurgery;  Laterality: Right;  . punctured eardrum      to relieve blood build up   for ear infection  . TONSILLECTOMY      Allergies as of 08/20/2019      Reactions   Iohexol Other (See Comments)    Code: HIVES, Desc: PATENT STATES HE IS ALLERGIC TO IV DYE 09/14/08/RM, Onset Date: [id number]   Ciprofloxacin Hives, Rash   "Patient unsure" just know he has a reaction to med Medical record indicates allergic reaction   Other Other (See Comments)   UNSPECIFIED REACTION  Angioplasty dye pt.>> Pt only recalls Had "very bad reaction"      Medication List       Accurate as of August 20, 2019  3:11 PM. If you have any questions, ask your nurse or doctor.        aspirin EC 81 MG tablet Take 81 mg by mouth daily after supper.   finasteride 5 MG tablet Commonly known as: PROSCAR Take 5 mg by mouth daily after supper.   gabapentin 300 MG capsule Commonly known as: NEURONTIN Take 300 mg by mouth daily after supper.   hydrALAZINE 25 MG tablet Commonly known as: APRESOLINE Take 1 tablet (25 mg total) by mouth 2 (two) times daily.   lisinopril 5 MG tablet Commonly known as: ZESTRIL Take 1 tablet (5 mg total) by mouth daily.   meclizine 12.5 MG tablet Commonly known as: ANTIVERT Take 1 tablet (12.5 mg total) by mouth 3 (three) times daily as needed for dizziness.   metoprolol succinate 25 MG 24 hr tablet Commonly known as: TOPROL-XL Take 1 tablet (25 mg total) by mouth daily.   nitroGLYCERIN 0.4 MG SL tablet Commonly known as: NITROSTAT Place 1 tablet (0.4 mg total) under the tongue every 5 (five) minutes as needed for chest pain (x 3 doses). Reported on 06/15/2015   NON FORMULARY DIET :Regular NAS   ondansetron 4 MG disintegrating tablet Commonly known as: Zofran ODT Take 1 tablet (4 mg total) by mouth every 8 (eight) hours as needed for nausea or vomiting.   rosuvastatin 10 MG tablet Commonly known as: CRESTOR Take 1 tablet (10 mg total) by  mouth daily.   sertraline 100 MG tablet Commonly known as: ZOLOFT Take 100 mg by mouth daily.   tamsulosin 0.4 MG Caps capsule Commonly known as: Flomax Take 1 capsule (0.4 mg total) by mouth daily after supper.   tiZANidine 2 MG tablet Commonly known as: ZANAFLEX Take 2 mg by mouth See admin instructions. Take one tablet (2 mg) by mouth every morning, take one tablet (2 mg) later in the day as needed for pain   vitamin B-12 1000 MCG tablet Commonly known as: CYANOCOBALAMIN Take 1 tablet (1,000 mcg total) by mouth daily.       No orders of the defined types were placed in this encounter.   Immunization History  Administered Date(s) Administered  . Influenza, High Dose Seasonal PF 02/25/2013, 03/28/2018  . Influenza,inj,Quad PF,6+ Mos 01/20/2014, 01/18/2015, 01/24/2016  . PFIZER SARS-COV-2 Vaccination 06/25/2019,  07/21/2019  . Pneumococcal Conjugate-13 02/03/2014  . Pneumococcal Polysaccharide-23 02/11/2006  . Zoster Recombinat (Shingrix) 04/03/2018    Social History   Tobacco Use  . Smoking status: Former Smoker    Packs/day: 1.00    Years: 3.00    Pack years: 3.00    Types: Cigarettes    Quit date: 09/21/1955    Years since quitting: 63.9  . Smokeless tobacco: Never Used  Substance Use Topics  . Alcohol use: Yes    Alcohol/week: 0.0 standard drinks    Comment: one or two ounces a week    Family history is   Family History  Problem Relation Age of Onset  . Heart disease Father   . Ataxia Neg Hx   . Chorea Neg Hx   . Dementia Neg Hx   . Mental retardation Neg Hx   . Migraines Neg Hx   . Multiple sclerosis Neg Hx   . Neurofibromatosis Neg Hx   . Neuropathy Neg Hx   . Parkinsonism Neg Hx   . Seizures Neg Hx   . Stroke Neg Hx       Review of Systems   GENERAL:  no fevers, fatigue, appetite changes SKIN: No itching, or rash EYES: No eye pain, redness, discharge EARS: No earache, tinnitus, change in hearing NOSE: No congestion, drainage or  bleeding  MOUTH/THROAT: No mouth or tooth pain, No sore throat RESPIRATORY: No cough, wheezing, SOB CARDIAC: No chest pain, palpitations, lower extremity edema  GI: No abdominal pain, No N/V/D or constipation, No heartburn or reflux  GU: No dysuria, frequency or urgency, or incontinence  MUSCULOSKELETAL: No unrelieved bone/joint pain NEUROLOGIC: No headache, dizziness or focal weakness PSYCHIATRIC: No c/o anxiety or sadness   Vitals:   08/20/19 1509  BP: (!) 103/58  Pulse: 74  Resp: 18  Temp: 98.1 F (36.7 C)  SpO2: 98%    SpO2 Readings from Last 1 Encounters:  08/20/19 98%   Body mass index is 24.31 kg/m.     Physical Exam  GENERAL APPEARANCE: Alert, conversant,  No acute distress.  SKIN: No diaphoresis rash HEAD: Normocephalic, atraumatic  EYES: Conjunctiva/lids clear. Pupils round, reactive. EOMs intact.  EARS: External exam WNL, canals clear. Hearing grossly normal.  NOSE: No deformity or discharge.  MOUTH/THROAT: Lips w/o lesions  RESPIRATORY: Breathing is even, unlabored. Lung sounds are clear   CARDIOVASCULAR: Heart RRR no murmurs, rubs or gallops. No peripheral edema.   GASTROINTESTINAL: Abdomen is soft, non-tender, not distended w/ normal bowel sounds. GENITOURINARY: Bladder non tender, not distended  MUSCULOSKELETAL: No abnormal joints or musculature NEUROLOGIC:  Cranial nerves 2-12 grossly intact. Moves all extremities  PSYCHIATRIC: Mood and affect appropriate to situation, no behavioral issues  Patient Active Problem List   Diagnosis Date Noted  . Postural dizziness with presyncope 08/13/2019  . Hypertensive urgency 08/12/2019  . Impacted cerumen of both ears 08/12/2019  . Personal history of malignant melanoma of skin   . Lumbar radiculopathy   . Heart murmur   . GERD (gastroesophageal reflux disease)   . Diminished hearing, bilateral   . Cataracts, bilateral   . Bladder neck obstruction   . Arthritis   . Constipation 12/12/2016  . Nausea  12/12/2016  . Coronary artery disease involving native coronary artery of native heart without angina pectoris 11/22/2016  . Dyslipidemia 11/22/2016  . History of coronary artery bypass surgery 11/22/2016  . Depression with anxiety   . Orthostasis 09/20/2016  . HNP (herniated nucleus pulposus), lumbar 02/02/2016  . Spinal  stenosis of lumbar region 01/24/2016  . Sciatica, right side 01/24/2016  . Prediabetes 01/24/2016  . Otitis, externa, infective 01/24/2016  . Left lumbar radiculopathy 01/15/2016  . PVC's (premature ventricular contractions) 05/24/2015  . Peripheral polyneuropathy 03/06/2015  . BPPV (benign paroxysmal positional vertigo), right 03/06/2015  . Headache 01/18/2015  . Vertigo 01/18/2015  . Allergic rhinitis 09/20/2014  . Dyspnea on exertion 08/10/2014  . Severe dizziness 08/10/2014  . Diarrhea 05/18/2014  . LLQ pain 05/13/2014  . Chronic meniscal tear of knee 09/22/2013  . Primary localized osteoarthrosis, lower leg 09/22/2013  . Neck pain, bilateral 12/18/2012  . Headache(784.0) 12/18/2012  . Binocular visual disturbance 12/18/2012  . Epiretinal membrane 12/17/2012  . Status post intraocular lens implant 12/17/2012  . Preventative health care 12/02/2012  . Low back pain 12/02/2012  . Impaired glucose tolerance 12/02/2012  . Palpitations 10/09/2010  . B12 deficiency 08/30/2010  . Hearing loss 08/30/2010  . Tinnitus 08/30/2010  . Neurogenic orthostatic hypotension ([place]) 06/28/2009  . Mixed hyperlipidemia 05/15/2009  . CHEST PAIN UNSPECIFIED 11/23/2008  . Hypotension, unspecified 10/12/2008  . DIZZINESS 10/12/2008  . Shortness of breath 09/14/2008  . UNSPECIFIED ANEMIA 09/13/2008  . ANXIETY DEPRESSION 09/13/2008  . Old myocardial infarction 09/13/2008  . GASTROESOPHAGEAL REFLUX DISEASE, HX OF 09/13/2008  . LUMBAR RADICULOPATHY, LEFT 08/23/2008  . MELANOMA, SHOULDER 11/27/2006  . Essential hypertension 11/27/2006  . Coronary atherosclerosis 11/27/2006  .  BPH associated with nocturia 11/27/2006  . ERECTILE DYSFUNCTION, ORGANIC 11/27/2006      Labs reviewed: Basic Metabolic Panel:    Component Value Date/Time   NA 141 08/15/2019 1725   NA 141 08/11/2019 0947   K 4.2 08/15/2019 1725   CL 108 08/15/2019 1725   CO2 27 08/15/2019 1725   GLUCOSE 112 (H) 08/15/2019 1725   GLUCOSE 113 (H) 03/18/2006 0839   BUN 15 08/15/2019 1725   BUN 20 08/11/2019 0947   CREATININE 0.80 08/15/2019 1725   CREATININE 0.86 10/23/2016 1211   CALCIUM 8.8 (L) 08/15/2019 1725   PROT 5.7 (L) 08/15/2019 1725   PROT 6.3 08/11/2019 0947   ALBUMIN 3.4 (L) 08/15/2019 1725   ALBUMIN 4.5 08/11/2019 0947   AST 23 08/15/2019 1725   ALT 23 08/15/2019 1725   ALKPHOS 43 08/15/2019 1725   BILITOT 0.9 08/15/2019 1725   BILITOT 0.5 08/11/2019 0947   GFRNONAA >60 08/15/2019 1725   GFRNONAA 81 10/23/2016 1211   GFRAA >60 08/15/2019 1725   GFRAA >89 10/23/2016 1211    Recent Labs    08/11/19 0947 08/12/19 1340 08/13/19 0247 08/15/19 1725  NA 141 140  --  141  K 3.9 4.2  --  4.2  CL 105 106  --  108  CO2 22 24  --  27  GLUCOSE 142* 168*  --  112*  BUN 20 19  --  15  CREATININE 0.79 0.76  --  0.80  CALCIUM 9.5 9.5  --  8.8*  MG  --   --  2.0  --    Liver Function Tests: Recent Labs    01/14/19 1457 08/11/19 0947 08/15/19 1725  AST 19 24 23   ALT 34 26 23  ALKPHOS 65 75 43  BILITOT 0.8 0.5 0.9  PROT 6.7 6.3 5.7*  ALBUMIN 4.9* 4.5 3.4*   No results for input(s): LIPASE, AMYLASE in the last 8760 hours. No results for input(s): AMMONIA in the last 8760 hours. CBC: Recent Labs    01/14/19 1457 08/11/19 0947 08/12/19 1340 08/13/19 0247  08/15/19 1725  WBC 15.8*   < > 10.1 9.2 13.5*  NEUTROABS 11.7*  --   --  6.9 10.7*  HGB 17.2   < > 15.7 14.0 13.7  HCT 48.7   < > 47.8 42.4 41.9  MCV 87   < > 88.4 88.0 90.1  PLT 200   < > 167 144* 127*   < > = values in this interval not displayed.   Lipid No results for input(s): CHOL, HDL, LDLCALC, TRIG in  the last 8760 hours.  Cardiac Enzymes: No results for input(s): CKTOTAL, CKMB, CKMBINDEX, TROPONINI in the last 8760 hours. BNP: No results for input(s): BNP in the last 8760 hours. No results found for: [facility] Lab Results  Component Value Date   HGBA1C 6.4 (H) 08/13/2019   Lab Results  Component Value Date   TSH 2.884 08/13/2019   Lab Results  Component Value Date   VITAMINB12 161 (L) 08/13/2019   VITAMINB12 153 (L) 08/13/2019   Lab Results  Component Value Date   FOLATE 10.1 08/13/2019   No results found for: IRON, TIBC, FERRITIN  Imaging and Procedures obtained prior to SNF admission: No results found.   Not all labs, radiology exams or other studies done during hospitalization come through on my EPIC note; however they are reviewed by me.    Assessment and Plan  Dizziness likely benign positional paroxysmal vertigo versus right side-no neurological or central cause is identified, MRI of the brain negative, no evidence of infection; orthostatics borderline positive therefore advised him to orally hydrate himself as much as possible no dizziness on discharge; EF [id number] grade 2 diastolic dysfunction carotid Dopplers were negative TSH within normal limits SNF-admitted for vestibular training/OT/PT; continue meclizine 12.5 mg 3 times daily as needed  Vitamin B12 deficiency SNF-continue replacement 1000 mcg daily  Coronary artery disease SNF-stable; continue ASA 81 mg daily lisinopril 5 mg daily metoprolol XL 25 mg daily, patient is on statin  Hypertension SNF-controlled; continue hydralazine 25 mg twice daily, lisinopril 5 mg daily and metoprolol XL 25 mg daily  BPH SNF without infections; continue Proscar 5 mg daily and Flomax 0.4 mg daily  Hyperlipidemia SNF-no status uncontrolled; continue Crestor 10 mg daily   Time spent greater than 45 minutes;> 50% of time with patient was spent reviewing records, labs, tests and studies, counseling and developing plan  of care  Hennie Duos, MD

## 2019-08-22 ENCOUNTER — Encounter: Payer: Self-pay | Admitting: Internal Medicine

## 2019-08-24 ENCOUNTER — Non-Acute Institutional Stay (SKILLED_NURSING_FACILITY): Payer: Medicare Other | Admitting: Internal Medicine

## 2019-08-24 DIAGNOSIS — L989 Disorder of the skin and subcutaneous tissue, unspecified: Secondary | ICD-10-CM

## 2019-08-24 NOTE — Progress Notes (Signed)
Location:  [facility] Room Number: 112-P Place of Service:  SNF [phone])  Antony Contras, MD  Patient Care Team: Antony Contras, MD as PCP - General (Family Medicine) Dorothy Spark, MD as PCP - Cardiology (Cardiology) Deboraha Sprang, MD as PCP - Electrophysiology (Cardiology) Verl Blalock Marijo Conception, MD (Inactive) (Cardiology)  Extended Emergency Contact Information Primary Emergency Contact: Darrol Jump Address: [address] [place] [address] Johnnette Litter of [place] Phone: [phone] Mobile Phone: [phone] Relation: Spouse Secondary Emergency Contact: Reganess,Dianne  [place] Phone: [phone] Relation: Daughter    Allergies: Iohexol, Ciprofloxacin, and Other  Chief Complaint  Patient presents with  . Acute Visit    Patient is seen for a skin lesion    HPI: Patient is an 86 y.o. male who the wound care nurse asked me to see for lesion on his right arm.  She thinks it looks like an insect bite as does the nurse.  Patient has no complaints other than that is itchy and has been there for several days.  Past Medical History:  Diagnosis Date  . Allergic rhinitis 09/20/2014  . Anemia, unspecified   . Arthritis   . Bladder neck obstruction   . Cataracts, bilateral   . Coronary atherosclerosis of unspecified type of vessel, native or graft   . Diminished hearing, bilateral   . Dysthymic disorder   . GERD (gastroesophageal reflux disease)   . Heart murmur    as a child  . Impaired glucose tolerance 12/02/2012  . Impotence of organic origin   . Lumbar radiculopathy   . Malignant melanoma of skin of upper limb, including shoulder ([place])   . Old myocardial infarction   . Personal history of malignant melanoma of skin   . Personal history of other diseases of digestive system   . Thoracic or lumbosacral neuritis or radiculitis, unspecified   . Unspecified essential hypertension     Past Surgical  History:  Procedure Laterality Date  . ARTHRODESIS  07/06/2002   of left long finger distal interphalangeal joint with Kirschner wire fixation X 3  . ATRIAL ABLATION SURGERY  05/04/2001   Dr. [name]  . CARDIAC CATHETERIZATION  09/05/2008   Revealing 4 of 4 patent grafts with native multivessel coronary artery disease, EF  of 60% without regional wall motion abnormalities.  Marland Kitchen CATARACT EXTRACTION, BILATERAL    . CHOLECYSTECTOMY    . COLONOSCOPY    . CORONARY ANGIOPLASTY  1993  . CORONARY ARTERY BYPASS GRAFT  10/17/2000   [name]. [name], [place]     AFTER LIVER TRAUMA AND HAND SURGERY  . EYE SURGERY    . HERNIA REPAIR     BILATERAL  . LEFT HEART CATH AND CORS/GRAFTS ANGIOGRAPHY N/A 04/03/2017   Procedure: LEFT HEART CATH AND CORS/GRAFTS ANGIOGRAPHY;  Surgeon: Leonie Man, MD;  Location: [facility];  Service: Cardiovascular;  Laterality: N/A;  . LUMBAR LAMINECTOMY/DECOMPRESSION MICRODISCECTOMY N/A 02/02/2016   Procedure: MICRODISCECTOMY LUMBAR FOUR- LUMBAR FIVE;  Surgeon: Ashok Pall, MD;  Location: [facility];  Service: Neurosurgery;  Laterality: N/A;  MICRODISCECTOMY L4-L5  . LUMBAR LAMINECTOMY/DECOMPRESSION MICRODISCECTOMY Right 08/02/2016   Procedure: MICRODISCECTOMY LUMBAR FOUR- LUMBAR FIVE RIGHT;  Surgeon: Ashok Pall, MD;  Location: [facility];  Service: Neurosurgery;  Laterality: Right;  . punctured eardrum      to relieve blood build up   for ear infection  .  TONSILLECTOMY      Allergies as of 08/24/2019      Reactions   Iohexol Other (See Comments)    Code: HIVES, Desc: PATENT STATES HE IS ALLERGIC TO IV DYE 09/14/08/RM, Onset Date: [id number]   Ciprofloxacin Hives, Rash   "Patient unsure" just know he has a reaction to med Medical record indicates allergic reaction   Other Other (See Comments)   UNSPECIFIED REACTION  Angioplasty dye pt.>> Pt only recalls Had "very bad reaction"      Medication List       Accurate as of August 24, 2019  11:59 PM. If you have any questions, ask your nurse or doctor.        aspirin EC 81 MG tablet Take 81 mg by mouth daily after supper.   finasteride 5 MG tablet Commonly known as: PROSCAR Take 5 mg by mouth daily after supper.   gabapentin 300 MG capsule Commonly known as: NEURONTIN Take 300 mg by mouth daily after supper.   hydrALAZINE 25 MG tablet Commonly known as: APRESOLINE Take 1 tablet (25 mg total) by mouth 2 (two) times daily.   lisinopril 5 MG tablet Commonly known as: ZESTRIL Take 1 tablet (5 mg total) by mouth daily.   meclizine 12.5 MG tablet Commonly known as: ANTIVERT Take 1 tablet (12.5 mg total) by mouth 3 (three) times daily as needed for dizziness.   metoprolol succinate 25 MG 24 hr tablet Commonly known as: TOPROL-XL Take 1 tablet (25 mg total) by mouth daily.   midodrine 5 MG tablet Commonly known as: PROAMATINE Take 5 mg by mouth 3 (three) times daily with meals.   nitroGLYCERIN 0.4 MG SL tablet Commonly known as: NITROSTAT Place 1 tablet (0.4 mg total) under the tongue every 5 (five) minutes as needed for chest pain (x 3 doses). Reported on 06/15/2015   NON FORMULARY DIET :Regular NAS   ondansetron 4 MG disintegrating tablet Commonly known as: Zofran ODT Take 1 tablet (4 mg total) by mouth every 8 (eight) hours as needed for nausea or vomiting.   rosuvastatin 10 MG tablet Commonly known as: CRESTOR Take 1 tablet (10 mg total) by mouth daily.   sertraline 100 MG tablet Commonly known as: ZOLOFT Take 100 mg by mouth daily.   tamsulosin 0.4 MG Caps capsule Commonly known as: Flomax Take 1 capsule (0.4 mg total) by mouth daily after supper.   tiZANidine 2 MG tablet Commonly known as: ZANAFLEX Take 2 mg by mouth See admin instructions. Take one tablet (2 mg) by mouth every morning, take one tablet (2 mg) later in the day as needed for pain   traMADol 50 MG tablet Commonly known as: ULTRAM Take 0.5 tablets (25 mg total) by mouth at  bedtime as needed. For 14 days for neck pain   vitamin B-12 1000 MCG tablet Commonly known as: CYANOCOBALAMIN Take 1 tablet (1,000 mcg total) by mouth daily.       No orders of the defined types were placed in this encounter.   Immunization History  Administered Date(s) Administered  . Influenza, High Dose Seasonal PF 02/25/2013, 03/28/2018  . Influenza,inj,Quad PF,6+ Mos 01/20/2014, 01/18/2015, 01/24/2016  . PFIZER SARS-COV-2 Vaccination 06/25/2019, 07/21/2019  . Pneumococcal Conjugate-13 02/03/2014  . Pneumococcal Polysaccharide-23 02/11/2006  . Zoster Recombinat (Shingrix) 04/03/2018    Social History   Tobacco Use  . Smoking status: Former Smoker    Packs/day: 1.00    Years: 3.00    Pack years: 3.00    Types: Cigarettes  Quit date: 09/21/1955    Years since quitting: 63.9  . Smokeless tobacco: Never Used  Substance Use Topics  . Alcohol use: Yes    Alcohol/week: 0.0 standard drinks    Comment: one or two ounces a week    Review of Systems   GENERAL:  no fevers, fatigue, appetite changes SKIN: Small lesion is itchy HEENT: No complaint RESPIRATORY: No cough, wheezing, SOB CARDIAC: No chest pain, palpitations, lower extremity edema  GI: No abdominal pain, No N/V/D or constipation, No heartburn or reflux  GU: No dysuria, frequency or urgency, or incontinence  MUSCULOSKELETAL: No unrelieved bone/joint pain NEUROLOGIC: No headache, dizziness  PSYCHIATRIC: No overt anxiety or sadness  Vitals:   08/24/19 1635  BP: (!) 103/58  Pulse: 74  Resp: 18  Temp: 98.2 F (36.8 C)   Body mass index is 24.31 kg/m. Physical Exam  GENERAL APPEARANCE: Alert, conversant, No acute distress  SKIN: Lesion approximately 1 cm round pink minimally raised HEENT: Unremarkable RESPIRATORY: Breathing is even, unlabored. Lung sounds are clear   CARDIOVASCULAR: Heart RRR no murmurs, rubs or gallops. No peripheral edema  GASTROINTESTINAL: Abdomen is soft, non-tender, not  distended w/ normal bowel sounds.  GENITOURINARY: Bladder non tender, not distended  MUSCULOSKELETAL: No abnormal joints or musculature NEUROLOGIC: Cranial nerves 2-12 grossly intact. Moves all extremities PSYCHIATRIC: Mood and affect appropriate to situation, no behavioral issues  Patient Active Problem List   Diagnosis Date Noted  . Postural dizziness with presyncope 08/13/2019  . Hypertensive urgency 08/12/2019  . Impacted cerumen of both ears 08/12/2019  . Personal history of malignant melanoma of skin   . Lumbar radiculopathy   . Heart murmur   . GERD (gastroesophageal reflux disease)   . Diminished hearing, bilateral   . Cataracts, bilateral   . Bladder neck obstruction   . Arthritis   . Constipation 12/12/2016  . Nausea 12/12/2016  . Coronary artery disease involving native coronary artery of native heart without angina pectoris 11/22/2016  . Dyslipidemia 11/22/2016  . History of coronary artery bypass surgery 11/22/2016  . Depression with anxiety   . Orthostasis 09/20/2016  . HNP (herniated nucleus pulposus), lumbar 02/02/2016  . Spinal stenosis of lumbar region 01/24/2016  . Sciatica, right side 01/24/2016  . Prediabetes 01/24/2016  . Otitis, externa, infective 01/24/2016  . Left lumbar radiculopathy 01/15/2016  . PVC's (premature ventricular contractions) 05/24/2015  . Peripheral polyneuropathy 03/06/2015  . BPPV (benign paroxysmal positional vertigo), right 03/06/2015  . Headache 01/18/2015  . Vertigo 01/18/2015  . Allergic rhinitis 09/20/2014  . Dyspnea on exertion 08/10/2014  . Severe dizziness 08/10/2014  . Diarrhea 05/18/2014  . LLQ pain 05/13/2014  . Chronic meniscal tear of knee 09/22/2013  . Primary localized osteoarthrosis, lower leg 09/22/2013  . Neck pain, bilateral 12/18/2012  . Headache(784.0) 12/18/2012  . Binocular visual disturbance 12/18/2012  . Epiretinal membrane 12/17/2012  . Status post intraocular lens implant 12/17/2012  . Preventative  health care 12/02/2012  . Low back pain 12/02/2012  . Impaired glucose tolerance 12/02/2012  . Palpitations 10/09/2010  . B12 deficiency 08/30/2010  . Hearing loss 08/30/2010  . Tinnitus 08/30/2010  . Neurogenic orthostatic hypotension ([place]) 06/28/2009  . Mixed hyperlipidemia 05/15/2009  . CHEST PAIN UNSPECIFIED 11/23/2008  . Hypotension, unspecified 10/12/2008  . DIZZINESS 10/12/2008  . Shortness of breath 09/14/2008  . UNSPECIFIED ANEMIA 09/13/2008  . ANXIETY DEPRESSION 09/13/2008  . Old myocardial infarction 09/13/2008  . GASTROESOPHAGEAL REFLUX DISEASE, HX OF 09/13/2008  . LUMBAR RADICULOPATHY, LEFT 08/23/2008  .  MELANOMA, SHOULDER 11/27/2006  . Essential hypertension 11/27/2006  . Coronary atherosclerosis 11/27/2006  . BPH associated with nocturia 11/27/2006  . ERECTILE DYSFUNCTION, ORGANIC 11/27/2006    CMP     Component Value Date/Time   NA 141 08/19/2019 0000   K 4.2 08/19/2019 0000   CL 106 08/19/2019 0000   CO2 19 08/19/2019 0000   GLUCOSE 112 (H) 08/15/2019 1725   GLUCOSE 113 (H) 03/18/2006 0839   BUN 14 08/19/2019 0000   CREATININE 0.6 08/19/2019 0000   CREATININE 0.80 08/15/2019 1725   CREATININE 0.86 10/23/2016 1211   CALCIUM 8.9 08/19/2019 0000   PROT 5.7 (L) 08/15/2019 1725   PROT 6.3 08/11/2019 0947   ALBUMIN 3.4 (L) 08/15/2019 1725   ALBUMIN 4.5 08/11/2019 0947   AST 23 08/15/2019 1725   ALT 23 08/15/2019 1725   ALKPHOS 43 08/15/2019 1725   BILITOT 0.9 08/15/2019 1725   BILITOT 0.5 08/11/2019 0947   GFRNONAA 90 08/19/2019 0000   GFRNONAA 81 10/23/2016 1211   GFRAA 90 08/19/2019 0000   GFRAA >89 10/23/2016 1211   Recent Labs    08/11/19 0947 08/11/19 0947 08/12/19 1340 08/13/19 0247 08/15/19 1725 08/19/19 0000  NA 141  --  140  --  141 141  K 3.9   < > 4.2  --  4.2 4.2  CL 105   < > 106  --  108 106  CO2 22   < > 24  --  27 19  GLUCOSE 142*  --  168*  --  112*  --   BUN 20  --  19  --  15 14  CREATININE 0.79   < > 0.76  --  0.80 0.6    CALCIUM 9.5   < > 9.5  --  8.8* 8.9  MG  --   --   --  2.0  --   --    < > = values in this interval not displayed.   Recent Labs    01/14/19 1457 08/11/19 0947 08/15/19 1725  AST 19 24 23   ALT 34 26 23  ALKPHOS 65 75 43  BILITOT 0.8 0.5 0.9  PROT 6.7 6.3 5.7*  ALBUMIN 4.9* 4.5 3.4*   Recent Labs    08/12/19 1340 08/12/19 1340 08/13/19 0247 08/15/19 1725 08/19/19 0000  WBC 10.1   < > 9.2 13.5* 9.7  NEUTROABS  --   --  6.9 10.7* 7  HGB 15.7   < > 14.0 13.7 14.8  HCT 47.8   < > 42.4 41.9 44  MCV 88.4  --  88.0 90.1  --   PLT 167   < > 144* 127* 149*   < > = values in this interval not displayed.   No results for input(s): CHOL, LDLCALC, TRIG in the last 8760 hours.  Invalid input(s): HCL No results found for: [facility] Lab Results  Component Value Date   TSH 2.884 08/13/2019   Lab Results  Component Value Date   HGBA1C 6.4 (H) 08/13/2019   Lab Results  Component Value Date   CHOL 110 01/24/2016   HDL 49.70 01/24/2016   LDLCALC 34 01/24/2016   TRIG 133.0 01/24/2016   CHOLHDL 2 01/24/2016    Significant Diagnostic Results in last 30 days:  MR Brain Wo Contrast (neuro protocol)  Result Date: 08/12/2019 CLINICAL DATA:  Dizziness, unsteady; ataxia, stroke suspected. Additional history provided: Patient reports dizziness when moving head, symptoms for several weeks. EXAM: MRI HEAD WITHOUT CONTRAST TECHNIQUE:  Multiplanar, multiecho pulse sequences of the brain and surrounding structures were obtained without intravenous contrast. COMPARISON:  Brain MRI 11/07/2016 FINDINGS: Brain: There is no evidence of acute infarct. No evidence of intracranial mass. No midline shift or extra-axial fluid collection. No chronic intracranial blood products. Redemonstrated advanced chronic small vessel ischemic disease within the cerebral white matter, basal ganglia and thalami. To a lesser degree, there are chronic small-vessel ischemic changes within the pons. Stable, moderate  generalized parenchymal atrophy. Vascular: Flow voids maintained within the proximal large arterial vessels. Skull and upper cervical spine: No focal marrow lesion. Sinuses/Orbits: Bilateral lens replacements. Trace paranasal sinus mucosal thickening greatest within bilateral ethmoid air cells. No significant mastoid effusion. IMPRESSION: No evidence of acute intracranial abnormality, including acute infarction. Redemonstrated advanced chronic small vessel ischemic changes within the cerebral white matter, basal ganglia and thalami. Mild chronic small vessel ischemic changes are also present within the pons. Stable, moderate generalized parenchymal atrophy. Electronically Signed   By: Kellie Simmering DO   On: 08/12/2019 19:12   ECHOCARDIOGRAM COMPLETE  Result Date: 08/13/2019    ECHOCARDIOGRAM REPORT   Patient Name:   Troy Combs Date of Exam: 08/13/2019 Medical Rec #:  [id number]         Height:       69.0 in Accession #:    [id number]        Weight:       172.0 lb Date of Birth:  Sep 21, 1931         BSA:          1.938 m Patient Age:    89 years          BP:           129/67 mmHg Patient Gender: M                 HR:           60 bpm. Exam Location:  Inpatient Procedure: 2D Echo Indications:    Syncope 780.2 / R55  History:        Patient has prior history of Echocardiogram examinations, most                 recent 08/16/2014. CAD and Previous Myocardial Infarction, Mitral                 Valve Prolapse; Signs/Symptoms:Murmur. GERD, anemia.  Sonographer:    Darlina Sicilian RDCS Referring Phys: [id number] ANKIT CHIRAG AMIN IMPRESSIONS  1. Left ventricular ejection fraction, by estimation, is 60 to 65%. The left ventricle has normal function. The left ventricle has no regional wall motion abnormalities. There is mild left ventricular hypertrophy. Left ventricular diastolic parameters are consistent with Grade II diastolic dysfunction (pseudonormalization). Elevated left ventricular end-diastolic pressure.  2. Right  ventricular systolic function is normal. The right ventricular size is mildly enlarged. There is mildly elevated pulmonary artery systolic pressure.  3. Left atrial size was mildly dilated.  4. The mitral valve is degenerative. Trivial mitral valve regurgitation. No evidence of mitral stenosis.  5. The aortic valve is tricuspid. Aortic valve regurgitation is trivial. No aortic stenosis is present.  6. The inferior vena cava is normal in size with greater than 50% respiratory variability, suggesting right atrial pressure of 3 mmHg. FINDINGS  Left Ventricle: Left ventricular ejection fraction, by estimation, is 60 to 65%. The left ventricle has normal function. The left ventricle has no regional wall motion abnormalities. The left ventricular internal cavity size was normal in  size. There is  mild left ventricular hypertrophy. Left ventricular diastolic parameters are consistent with Grade II diastolic dysfunction (pseudonormalization). Elevated left ventricular end-diastolic pressure. Right Ventricle: The right ventricular size is mildly enlarged. No increase in right ventricular wall thickness. Right ventricular systolic function is normal. There is mildly elevated pulmonary artery systolic pressure. The tricuspid regurgitant velocity is 3.03 m/s, and with an assumed right atrial pressure of 3 mmHg, the estimated right ventricular systolic pressure is [id number]. Left Atrium: Left atrial size was mildly dilated. Right Atrium: Right atrial size was normal in size. Pericardium: There is no evidence of pericardial effusion. Mitral Valve: The mitral valve is degenerative in appearance. There is mild thickening of the mitral valve leaflet(s). There is mild calcification of the mitral valve leaflet(s). Normal mobility of the mitral valve leaflets. Moderate mitral annular calcification. Trivial mitral valve regurgitation. No evidence of mitral valve stenosis. Tricuspid Valve: The tricuspid valve is normal in structure.  Tricuspid valve regurgitation is mild . No evidence of tricuspid stenosis. Aortic Valve: The aortic valve is tricuspid. Aortic valve regurgitation is trivial. No aortic stenosis is present. Pulmonic Valve: The pulmonic valve was normal in structure. Pulmonic valve regurgitation is mild. No evidence of pulmonic stenosis. Aorta: The aortic root is normal in size and structure. Venous: The inferior vena cava is normal in size with greater than 50% respiratory variability, suggesting right atrial pressure of 3 mmHg. IAS/Shunts: The interatrial septum was not well visualized.  LEFT VENTRICLE PLAX 2D LVIDd:         4.55 cm  Diastology LVIDs:         3.64 cm  LV e' lateral:   5.77 cm/s LV PW:         0.98 cm  LV E/e' lateral: 15.2 LV IVS:        1.36 cm  LV e' medial:    6.53 cm/s LVOT diam:     1.80 cm  LV E/e' medial:  13.4 LV SV:         30 LV SV Index:   15 LVOT Area:     2.54 cm  RIGHT VENTRICLE RV S prime:     10.10 cm/s TAPSE (M-mode): 1.3 cm LEFT ATRIUM           Index       RIGHT ATRIUM           Index LA diam:      4.00 cm 2.06 cm/m  RA Area:     11.20 cm LA Vol (A2C): 45.0 ml 23.22 ml/m RA Volume:   18.10 ml  9.34 ml/m LA Vol (A4C): 94.2 ml 48.61 ml/m  AORTIC VALVE LVOT Vmax:   48.00 cm/s LVOT Vmean:  37.900 cm/s LVOT VTI:    0.118 m  AORTA Ao Root diam: 3.30 cm Ao Asc diam:  3.30 cm MITRAL VALVE               TRICUSPID VALVE MV Area (PHT): 3.53 cm    TR Peak grad:   36.7 mmHg MV Decel Time: 215 msec    TR Vmax:        303.00 cm/s MV E velocity: 87.80 cm/s MV A velocity: 91.70 cm/s  SHUNTS MV E/A ratio:  0.96        Systemic VTI:  0.12 m                            Systemic Diam: 1.80  cm Jenkins Rouge MD Electronically signed by Jenkins Rouge MD Signature Date/Time: 08/13/2019/11:38:28 AM    Final    VAS US CAROTID  Result Date: 08/13/2019 Carotid Arterial Duplex Study Indications:       Syncope. Comparison Study:  no prior Performing Technologist: Abram Sander RVS  Examination Guidelines: A complete  evaluation includes B-mode imaging, spectral Doppler, color Doppler, and power Doppler as needed of all accessible portions of each vessel. Bilateral testing is considered an integral part of a complete examination. Limited examinations for reoccurring indications may be performed as noted.  Right Carotid Findings: +----------+--------+--------+--------+------------------+--------+           PSV cm/sEDV cm/sStenosisPlaque DescriptionComments +----------+--------+--------+--------+------------------+--------+ CCA Prox  86      13              heterogenous               +----------+--------+--------+--------+------------------+--------+ CCA Distal53      11              heterogenous               +----------+--------+--------+--------+------------------+--------+ ICA Prox  45      13      1-39%   heterogenous               +----------+--------+--------+--------+------------------+--------+ ICA Distal65      21                                         +----------+--------+--------+--------+------------------+--------+ ECA       70                                                 +----------+--------+--------+--------+------------------+--------+ +----------+--------+-------+--------+-------------------+           PSV cm/sEDV cmsDescribeArm Pressure (mmHG) +----------+--------+-------+--------+-------------------+ [id number]                                         +----------+--------+-------+--------+-------------------+ +---------+--------+--+--------+--+---------+ VertebralPSV cm/s74EDV cm/s15Antegrade +---------+--------+--+--------+--+---------+  Left Carotid Findings: +----------+--------+--------+--------+------------------+--------+           PSV cm/sEDV cm/sStenosisPlaque DescriptionComments +----------+--------+--------+--------+------------------+--------+ CCA Prox  115     15              heterogenous                +----------+--------+--------+--------+------------------+--------+ CCA Distal50      9               heterogenous               +----------+--------+--------+--------+------------------+--------+ ICA Prox  62      18      1-39%   heterogenous               +----------+--------+--------+--------+------------------+--------+ ICA Distal67      22                                         +----------+--------+--------+--------+------------------+--------+ ECA       54                                                 +----------+--------+--------+--------+------------------+--------+ +----------+--------+--------+--------+-------------------+  PSV cm/sEDV cm/sDescribeArm Pressure (mmHG) +----------+--------+--------+--------+-------------------+ [id number]                                          +----------+--------+--------+--------+-------------------+ +---------+--------+--+--------+-+---------+ VertebralPSV cm/s24EDV cm/s9Antegrade +---------+--------+--+--------+-+---------+   Summary: Right Carotid: Velocities in the right ICA are consistent with a 1-39% stenosis. Left Carotid: Velocities in the left ICA are consistent with a 1-39% stenosis. Vertebrals: Bilateral vertebral arteries demonstrate antegrade flow. *See table(s) above for measurements and observations.  Electronically signed by Antony Contras MD on 08/13/2019 at 1:48:42 PM.    Final     Assessment and Plan  Pruritic skin lesion-I do not know if it is an insect or not but I do think triamcinolone cream 1-2 times daily for a week will resolve;; case to the wound care nurse will follow    Hennie Duos, MD

## 2019-08-26 ENCOUNTER — Other Ambulatory Visit: Payer: Self-pay | Admitting: Internal Medicine

## 2019-08-26 ENCOUNTER — Non-Acute Institutional Stay (SKILLED_NURSING_FACILITY): Payer: Medicare Other | Admitting: Internal Medicine

## 2019-08-26 ENCOUNTER — Encounter: Payer: Self-pay | Admitting: Internal Medicine

## 2019-08-26 DIAGNOSIS — R42 Dizziness and giddiness: Secondary | ICD-10-CM | POA: Diagnosis not present

## 2019-08-26 DIAGNOSIS — M542 Cervicalgia: Secondary | ICD-10-CM

## 2019-08-26 DIAGNOSIS — M47812 Spondylosis without myelopathy or radiculopathy, cervical region: Secondary | ICD-10-CM

## 2019-08-26 MED ORDER — TRAMADOL HCL 50 MG PO TABS: 25.0000 mg | ORAL_TABLET | Freq: Every evening | ORAL | 0 refills | Status: DC | PRN

## 2019-08-26 NOTE — Progress Notes (Signed)
Location:    [facility] Room Number: 107/P Place of Service:  SNF [phone]) Provider:  Dossie Der, MD  Patient Care Team: Antony Contras, MD as PCP - General (Family Medicine) Dorothy Spark, MD as PCP - Cardiology (Cardiology) Deboraha Sprang, MD as PCP - Electrophysiology (Cardiology) Verl Blalock Marijo Conception, MD (Inactive) (Cardiology)  Extended Emergency Contact Information Primary Emergency Contact: Darrol Jump Address: [address] [place] [address] Johnnette Litter of [place] Phone: [phone] Mobile Phone: [phone] Relation: Spouse Secondary Emergency Contact: Sherrye Payor [place] of [place] Phone: [phone] Relation: Daughter  Code Status:  DNR Goals of care: Advanced Directive information Advanced Directives 08/26/2019  Does Patient Have a Medical Advance Directive? Yes  Type of Advance Directive Out of facility DNR (pink MOST or yellow form)  Does patient want to make changes to medical advance directive? No - Patient declined  Copy of Paint in Chart? -  Would patient like information on creating a medical advance directive? -  Pre-existing out of facility DNR order (yellow form or pink MOST form) Yellow form placed in chart (order not valid for inpatient use)      Chief complaint secondary to pain management   HPI:  Pt is a 86 y.o. male seen today for an acute visit for evaluation of pain management.  Patient was recently admitted to facility after hospitalization for weakness as well as dizziness -CVA work-up was negative-TSH was normal-weakness was thought possibly due to worsening vertigo.  He was discharged on Antivert as needed.  He went home but did not do well but thought he was probably in need of short-term skilled nursing and thus he is here.  He does have some history of chronic neck pain especially more so at night-apparently he has been on  tramadol in the past per family and they would like this restarted-I spoke with patient as well he feels the same way.  He does have orders for Neurontin 300 mg nightly also has orders for Zanaflex twice daily he receives this in the morning and then later in the day  Apparently he is tolerating the tramadol well in the past.  He does say the pain at night makes it difficult to sleep     Past Medical History:  Diagnosis Date   Allergic rhinitis 09/20/2014   Anemia, unspecified    Arthritis    Bladder neck obstruction    Cataracts, bilateral    Coronary atherosclerosis of unspecified type of vessel, native or graft    Diminished hearing, bilateral    Dysthymic disorder    GERD (gastroesophageal reflux disease)    Heart murmur    as a child   Impaired glucose tolerance 12/02/2012   Impotence of organic origin    Lumbar radiculopathy    Malignant melanoma of skin of upper limb, including shoulder ([place])    Old myocardial infarction    Personal history of malignant melanoma of skin    Personal history of other diseases of digestive system    Thoracic or lumbosacral neuritis or radiculitis, unspecified    Unspecified essential hypertension    Past Surgical History:  Procedure Laterality Date   ARTHRODESIS  07/06/2002   of left long finger distal interphalangeal joint with Kirschner wire fixation X 3   ATRIAL ABLATION SURGERY  05/04/2001   Dr. [name]   CARDIAC CATHETERIZATION  09/05/2008  Revealing 4 of 4 patent grafts with native multivessel coronary artery disease, EF  of 60% without regional wall motion abnormalities.   CATARACT EXTRACTION, BILATERAL     CHOLECYSTECTOMY     COLONOSCOPY     CORONARY ANGIOPLASTY  1993   CORONARY ARTERY BYPASS GRAFT  10/17/2000   Percell Miller B. [name], MD   EXPLORATORY LAPAROTOMY     AFTER LIVER TRAUMA AND HAND SURGERY   EYE SURGERY     HERNIA REPAIR     BILATERAL   LEFT HEART CATH AND CORS/GRAFTS  ANGIOGRAPHY N/A 04/03/2017   Procedure: LEFT HEART CATH AND CORS/GRAFTS ANGIOGRAPHY;  Surgeon: Leonie Man, MD;  Location: [facility];  Service: Cardiovascular;  Laterality: N/A;   LUMBAR LAMINECTOMY/DECOMPRESSION MICRODISCECTOMY N/A 02/02/2016   Procedure: MICRODISCECTOMY LUMBAR FOUR- LUMBAR FIVE;  Surgeon: Ashok Pall, MD;  Location: [facility];  Service: Neurosurgery;  Laterality: N/A;  MICRODISCECTOMY L4-L5   LUMBAR LAMINECTOMY/DECOMPRESSION MICRODISCECTOMY Right 08/02/2016   Procedure: MICRODISCECTOMY LUMBAR FOUR- LUMBAR FIVE RIGHT;  Surgeon: Ashok Pall, MD;  Location: [facility];  Service: Neurosurgery;  Laterality: Right;   punctured eardrum      to relieve blood build up   for ear infection   TONSILLECTOMY      Allergies  Allergen Reactions   Iohexol Other (See Comments)     Code: HIVES, Desc: PATENT STATES HE IS ALLERGIC TO IV DYE 09/14/08/RM, Onset Date: [id number]    Ciprofloxacin Hives and Rash    "Patient unsure" just know he has a reaction to med Medical record indicates allergic reaction   Other Other (See Comments)    UNSPECIFIED REACTION  Angioplasty dye pt.>> Pt only recalls Had "very bad reaction"    Outpatient Encounter Medications as of 08/26/2019  Medication Sig   aspirin EC 81 MG tablet Take 81 mg by mouth daily after supper.    finasteride (PROSCAR) 5 MG tablet Take 5 mg by mouth daily after supper.    gabapentin (NEURONTIN) 300 MG capsule Take 300 mg by mouth daily after supper.    lisinopril (ZESTRIL) 5 MG tablet Take 1 tablet (5 mg total) by mouth daily.   meclizine (ANTIVERT) 12.5 MG tablet Take 1 tablet (12.5 mg total) by mouth 3 (three) times daily as needed for dizziness.   midodrine (PROAMATINE) 5 MG tablet Take 5 mg by mouth 3 (three) times daily with meals.   nitroGLYCERIN (NITROSTAT) 0.4 MG SL tablet Place 1 tablet (0.4 mg total) under the tongue every 5 (five) minutes as needed for chest pain (x 3 doses). Reported on 06/15/2015   NON  FORMULARY DIET :Regular NAS   ondansetron (ZOFRAN ODT) 4 MG disintegrating tablet Take 1 tablet (4 mg total) by mouth every 8 (eight) hours as needed for nausea or vomiting.   rosuvastatin (CRESTOR) 10 MG tablet Take 1 tablet (10 mg total) by mouth daily.   sertraline (ZOLOFT) 100 MG tablet Take 100 mg by mouth daily.   tamsulosin (FLOMAX) 0.4 MG CAPS capsule Take 1 capsule (0.4 mg total) by mouth daily after supper.   tiZANidine (ZANAFLEX) 2 MG tablet Take 2 mg by mouth See admin instructions. Take one tablet (2 mg) by mouth every morning, take one tablet (2 mg) later in the day as needed for pain   traMADol (ULTRAM) 50 MG tablet Take 25 mg by mouth at bedtime as needed. For 14 days for neck pain   vitamin B-12 (CYANOCOBALAMIN) 1000 MCG tablet Take 1 tablet (1,000 mcg total) by mouth daily.   [  DISCONTINUED] hydrALAZINE (APRESOLINE) 25 MG tablet Take 1 tablet (25 mg total) by mouth 2 (two) times daily.   [DISCONTINUED] metoprolol succinate (TOPROL-XL) 25 MG 24 hr tablet Take 1 tablet (25 mg total) by mouth daily.   No facility-administered encounter medications on file as of 08/26/2019.    Review of Systems   General is not complaining of any fever or chills.  Skin is not complain of rashes itching or diaphoresis.  Head ears eyes nose mouth and throat not complain of visual changes or sore throat.  Respiratory is not complaining of being short of breath or having a cough.  Cardiac does not complain of chest pain or increasing edema.  GI is not complaining of abdominal discomfort nausea vomiting diarrhea constipation.  GU does not complain of dysuria.  Musculoskeletal continues to have some weakness but apparently is doing a bit better-he does complain of some neck discomfort at night-he describes this as t chronic and not new.  Neurologic this have a history of dizziness as noted above-apparently this has improved some however.  Not complain of headache or syncope.  Psych  does not complain of being depressed or anxious appears to be in good spirits as usual.      Immunization History  Administered Date(s) Administered   Influenza, High Dose Seasonal PF 02/25/2013, 03/28/2018   Influenza,inj,Quad PF,6+ Mos 01/20/2014, 01/18/2015, 01/24/2016   PFIZER SARS-COV-2 Vaccination 06/25/2019, 07/21/2019   Pneumococcal Conjugate-13 02/03/2014   Pneumococcal Polysaccharide-23 02/11/2006   Zoster Recombinat (Shingrix) 04/03/2018   Pertinent  Health Maintenance Due  Topic Date Due   INFLUENZA VACCINE  11/28/2019   PNA vac Low Risk Adult  Completed   Fall Risk  05/01/2018 09/19/2017 06/04/2017 11/13/2016 10/23/2016  Falls in the past year? 0 No Yes No No  Number falls in past yr: 0 - 1 - -  Injury with Fall? 0 - Yes - -  Risk Factor Category  - - High Fall Risk - -   Functional Status Survey:    Vitals:   08/26/19 1624  BP: 135/75  Pulse: 66  Resp: 18  Temp: 97.9 F (36.6 C)  TempSrc: Oral  SpO2: 98%  Weight: 164 lb 9.6 oz (74.7 kg)  Height: 5\' 9"  (1.753 m)   Body mass index is 24.31 kg/m. Physical Exam   In general this is a pleasant elderly male in no distress resting comfortably in bed.  His skin is warm and dry.  Eyes visual acuity appears intact sclera and conjunctive are clear.  Oropharynx is clear mucous membranes moist.  Chest is clear to auscultation there is no labored breathing.  Heart is regular rate and rhythm without murmur gallop or rub he has minimal trace lower extremity edema bilaterally.  Abdomen is soft nontender with positive bowel sounds.  Musculoskeletal-limited exam since he is in bed but appears able to move all extremities x4 at relative baseline.-Cannot really appreciate any deformity of his neck-apparently the discomfort is more so at night  Neurologic appears grossly intact cannot appreciate lateralizing findings his speech remains clear.  Psych he is alert and oriented pleasant and appropriate  Labs  reviewed:  August 19, 2019.  WBC 9.7 hemoglobin 14.8 platelets 149.  Sodium 141 potassium 4.3 BUN 14.3 creatinine 0.56.   Recent Labs    08/11/19 0947 08/12/19 1340 08/13/19 0247 08/15/19 1725  NA 141 140  --  141  K 3.9 4.2  --  4.2  CL 105 106  --  108  CO2 22  24  --  27  GLUCOSE 142* 168*  --  112*  BUN 20 19  --  15  CREATININE 0.79 0.76  --  0.80  CALCIUM 9.5 9.5  --  8.8*  MG  --   --  2.0  --    Recent Labs    01/14/19 1457 08/11/19 0947 08/15/19 1725  AST 19 24 23   ALT 34 26 23  ALKPHOS 65 75 43  BILITOT 0.8 0.5 0.9  PROT 6.7 6.3 5.7*  ALBUMIN 4.9* 4.5 3.4*   Recent Labs    01/14/19 1457 08/11/19 0947 08/12/19 1340 08/13/19 0247 08/15/19 1725  WBC 15.8*   < > 10.1 9.2 13.5*  NEUTROABS 11.7*  --   --  6.9 10.7*  HGB 17.2   < > 15.7 14.0 13.7  HCT 48.7   < > 47.8 42.4 41.9  MCV 87   < > 88.4 88.0 90.1  PLT 200   < > 167 144* 127*   < > = values in this interval not displayed.   Lab Results  Component Value Date   TSH 2.884 08/13/2019   Lab Results  Component Value Date   HGBA1C 6.4 (H) 08/13/2019   Lab Results  Component Value Date   CHOL 110 01/24/2016   HDL 49.70 01/24/2016   LDLCALC 34 01/24/2016   TRIG 133.0 01/24/2016   CHOLHDL 2 01/24/2016    Significant Diagnostic Results in last 30 days:  MR Brain Wo Contrast (neuro protocol)  Result Date: 08/12/2019 CLINICAL DATA:  Dizziness, unsteady; ataxia, stroke suspected. Additional history provided: Patient reports dizziness when moving head, symptoms for several weeks. EXAM: MRI HEAD WITHOUT CONTRAST TECHNIQUE: Multiplanar, multiecho pulse sequences of the brain and surrounding structures were obtained without intravenous contrast. COMPARISON:  Brain MRI 11/07/2016 FINDINGS: Brain: There is no evidence of acute infarct. No evidence of intracranial mass. No midline shift or extra-axial fluid collection. No chronic intracranial blood products. Redemonstrated advanced chronic small vessel  ischemic disease within the cerebral white matter, basal ganglia and thalami. To a lesser degree, there are chronic small-vessel ischemic changes within the pons. Stable, moderate generalized parenchymal atrophy. Vascular: Flow voids maintained within the proximal large arterial vessels. Skull and upper cervical spine: No focal marrow lesion. Sinuses/Orbits: Bilateral lens replacements. Trace paranasal sinus mucosal thickening greatest within bilateral ethmoid air cells. No significant mastoid effusion. IMPRESSION: No evidence of acute intracranial abnormality, including acute infarction. Redemonstrated advanced chronic small vessel ischemic changes within the cerebral white matter, basal ganglia and thalami. Mild chronic small vessel ischemic changes are also present within the pons. Stable, moderate generalized parenchymal atrophy. Electronically Signed   By: Kellie Simmering DO   On: 08/12/2019 19:12   ECHOCARDIOGRAM COMPLETE  Result Date: 08/13/2019    ECHOCARDIOGRAM REPORT   Patient Name:   Troy Combs Date of Exam: 08/13/2019 Medical Rec #:  [id number]         Height:       69.0 in Accession #:    [id number]        Weight:       172.0 lb Date of Birth:  11-Apr-1932         BSA:          1.938 m Patient Age:    77 years          BP:           129/67 mmHg Patient Gender: M  HR:           60 bpm. Exam Location:  Inpatient Procedure: 2D Echo Indications:    Syncope 780.2 / R55  History:        Patient has prior history of Echocardiogram examinations, most                 recent 08/16/2014. CAD and Previous Myocardial Infarction, Mitral                 Valve Prolapse; Signs/Symptoms:Murmur. GERD, anemia.  Sonographer:    Darlina Sicilian RDCS Referring Phys: [id number] ANKIT CHIRAG AMIN IMPRESSIONS  1. Left ventricular ejection fraction, by estimation, is 60 to 65%. The left ventricle has normal function. The left ventricle has no regional wall motion abnormalities. There is mild left ventricular  hypertrophy. Left ventricular diastolic parameters are consistent with Grade II diastolic dysfunction (pseudonormalization). Elevated left ventricular end-diastolic pressure.  2. Right ventricular systolic function is normal. The right ventricular size is mildly enlarged. There is mildly elevated pulmonary artery systolic pressure.  3. Left atrial size was mildly dilated.  4. The mitral valve is degenerative. Trivial mitral valve regurgitation. No evidence of mitral stenosis.  5. The aortic valve is tricuspid. Aortic valve regurgitation is trivial. No aortic stenosis is present.  6. The inferior vena cava is normal in size with greater than 50% respiratory variability, suggesting right atrial pressure of 3 mmHg. FINDINGS  Left Ventricle: Left ventricular ejection fraction, by estimation, is 60 to 65%. The left ventricle has normal function. The left ventricle has no regional wall motion abnormalities. The left ventricular internal cavity size was normal in size. There is  mild left ventricular hypertrophy. Left ventricular diastolic parameters are consistent with Grade II diastolic dysfunction (pseudonormalization). Elevated left ventricular end-diastolic pressure. Right Ventricle: The right ventricular size is mildly enlarged. No increase in right ventricular wall thickness. Right ventricular systolic function is normal. There is mildly elevated pulmonary artery systolic pressure. The tricuspid regurgitant velocity is 3.03 m/s, and with an assumed right atrial pressure of 3 mmHg, the estimated right ventricular systolic pressure is [id number]. Left Atrium: Left atrial size was mildly dilated. Right Atrium: Right atrial size was normal in size. Pericardium: There is no evidence of pericardial effusion. Mitral Valve: The mitral valve is degenerative in appearance. There is mild thickening of the mitral valve leaflet(s). There is mild calcification of the mitral valve leaflet(s). Normal mobility of the mitral valve  leaflets. Moderate mitral annular calcification. Trivial mitral valve regurgitation. No evidence of mitral valve stenosis. Tricuspid Valve: The tricuspid valve is normal in structure. Tricuspid valve regurgitation is mild . No evidence of tricuspid stenosis. Aortic Valve: The aortic valve is tricuspid. Aortic valve regurgitation is trivial. No aortic stenosis is present. Pulmonic Valve: The pulmonic valve was normal in structure. Pulmonic valve regurgitation is mild. No evidence of pulmonic stenosis. Aorta: The aortic root is normal in size and structure. Venous: The inferior vena cava is normal in size with greater than 50% respiratory variability, suggesting right atrial pressure of 3 mmHg. IAS/Shunts: The interatrial septum was not well visualized.  LEFT VENTRICLE PLAX 2D LVIDd:         4.55 cm  Diastology LVIDs:         3.64 cm  LV e' lateral:   5.77 cm/s LV PW:         0.98 cm  LV E/e' lateral: 15.2 LV IVS:        1.36 cm  LV e'  medial:    6.53 cm/s LVOT diam:     1.80 cm  LV E/e' medial:  13.4 LV SV:         30 LV SV Index:   15 LVOT Area:     2.54 cm  RIGHT VENTRICLE RV S prime:     10.10 cm/s TAPSE (M-mode): 1.3 cm LEFT ATRIUM           Index       RIGHT ATRIUM           Index LA diam:      4.00 cm 2.06 cm/m  RA Area:     11.20 cm LA Vol (A2C): 45.0 ml 23.22 ml/m RA Volume:   18.10 ml  9.34 ml/m LA Vol (A4C): 94.2 ml 48.61 ml/m  AORTIC VALVE LVOT Vmax:   48.00 cm/s LVOT Vmean:  37.900 cm/s LVOT VTI:    0.118 m  AORTA Ao Root diam: 3.30 cm Ao Asc diam:  3.30 cm MITRAL VALVE               TRICUSPID VALVE MV Area (PHT): 3.53 cm    TR Peak grad:   36.7 mmHg MV Decel Time: 215 msec    TR Vmax:        303.00 cm/s MV E velocity: 87.80 cm/s MV A velocity: 91.70 cm/s  SHUNTS MV E/A ratio:  0.96        Systemic VTI:  0.12 m                            Systemic Diam: 1.80 cm Jenkins Rouge MD Electronically signed by Jenkins Rouge MD Signature Date/Time: 08/13/2019/11:38:28 AM    Final    VAS US CAROTID  Result  Date: 08/13/2019 Carotid Arterial Duplex Study Indications:       Syncope. Comparison Study:  no prior Performing Technologist: Abram Sander RVS  Examination Guidelines: A complete evaluation includes B-mode imaging, spectral Doppler, color Doppler, and power Doppler as needed of all accessible portions of each vessel. Bilateral testing is considered an integral part of a complete examination. Limited examinations for reoccurring indications may be performed as noted.  Right Carotid Findings: +----------+--------+--------+--------+------------------+--------+             PSV cm/s EDV cm/s Stenosis Plaque Description Comments  +----------+--------+--------+--------+------------------+--------+  CCA Prox   86       13                heterogenous                 +----------+--------+--------+--------+------------------+--------+  CCA Distal 53       11                heterogenous                 +----------+--------+--------+--------+------------------+--------+  ICA Prox   45       13       1-39%    heterogenous                 +----------+--------+--------+--------+------------------+--------+  ICA Distal 65       21                                             +----------+--------+--------+--------+------------------+--------+  ECA  70                                                      +----------+--------+--------+--------+------------------+--------+ +----------+--------+-------+--------+-------------------+             PSV cm/s EDV cms Describe Arm Pressure (mmHG)  +----------+--------+-------+--------+-------------------+  Subclavian 79                                             +----------+--------+-------+--------+-------------------+ +---------+--------+--+--------+--+---------+  Vertebral PSV cm/s 74 EDV cm/s 15 Antegrade  +---------+--------+--+--------+--+---------+  Left Carotid Findings: +----------+--------+--------+--------+------------------+--------+             PSV cm/s EDV  cm/s Stenosis Plaque Description Comments  +----------+--------+--------+--------+------------------+--------+  CCA Prox   115      15                heterogenous                 +----------+--------+--------+--------+------------------+--------+  CCA Distal 50       9                 heterogenous                 +----------+--------+--------+--------+------------------+--------+  ICA Prox   62       18       1-39%    heterogenous                 +----------+--------+--------+--------+------------------+--------+  ICA Distal 67       22                                             +----------+--------+--------+--------+------------------+--------+  ECA        54                                                      +----------+--------+--------+--------+------------------+--------+ +----------+--------+--------+--------+-------------------+             PSV cm/s EDV cm/s Describe Arm Pressure (mmHG)  +----------+--------+--------+--------+-------------------+  Subclavian 71                                              +----------+--------+--------+--------+-------------------+ +---------+--------+--+--------+-+---------+  Vertebral PSV cm/s 24 EDV cm/s 9 Antegrade  +---------+--------+--+--------+-+---------+   Summary: Right Carotid: Velocities in the right ICA are consistent with a 1-39% stenosis. Left Carotid: Velocities in the left ICA are consistent with a 1-39% stenosis. Vertebrals: Bilateral vertebral arteries demonstrate antegrade flow. *See table(s) above for measurements and observations.  Electronically signed by Antony Contras MD on 08/13/2019 at 1:48:42 PM.    Final     Assessment/Plan #1 pain management-family apparently has been on tramadol in the past at night to help with neck pain which is more so at night-apparently this has been tolerated well will write an order for low-dose  tramadol 25 mg as needed nightly-we may have to go up on this but will see if the lower dose is effective.  He continues  on Neurontin 300 mg nightly as well earlier in the day he receives Zanaflex twice a day.  I did review results of his CT scan that was done in 2014 which showed degenerative changes of the cervical spine did not show any acute changes-this would lead to suspicious that this is chronic pain that  has been present for some time apparently fairly well controlled but is somewhat symptomatic at night   #2-vertigo-dizziness-he continues on midodrine 5 mg 3 times daily-this appears to be gradually improving although will have to be watched-at times he has complained of nausea and does have orders for as needed Zofran-but he is not complaining of that today-he appears to be doing a bit better  [id number]-no greater than 25 minutes spent assessing patient-discussing his concerns at bedside-reviewing chart labs and imaging-and coordinating and formulating plan of care-of note greater than 50% of time spent coordinating a plan of care with input as noted above

## 2019-08-29 ENCOUNTER — Encounter: Payer: Self-pay | Admitting: Internal Medicine

## 2019-08-30 ENCOUNTER — Non-Acute Institutional Stay (SKILLED_NURSING_FACILITY): Payer: Medicare Other | Admitting: Internal Medicine

## 2019-08-30 ENCOUNTER — Encounter: Payer: Self-pay | Admitting: Internal Medicine

## 2019-08-30 DIAGNOSIS — R55 Syncope and collapse: Secondary | ICD-10-CM

## 2019-08-30 DIAGNOSIS — H8111 Benign paroxysmal vertigo, right ear: Secondary | ICD-10-CM

## 2019-08-30 DIAGNOSIS — R42 Dizziness and giddiness: Secondary | ICD-10-CM | POA: Diagnosis not present

## 2019-08-30 DIAGNOSIS — Z710 Person encountering health services to consult on behalf of another person: Secondary | ICD-10-CM

## 2019-08-30 DIAGNOSIS — G903 Multi-system degeneration of the autonomic nervous system: Secondary | ICD-10-CM | POA: Diagnosis not present

## 2019-08-30 NOTE — Progress Notes (Signed)
Location:  [facility] Room Number: 107-P Place of Service:  SNF [phone])  Antony Contras, MD  Patient Care Team: Antony Contras, MD as PCP - General (Family Medicine) Dorothy Spark, MD as PCP - Cardiology (Cardiology) Deboraha Sprang, MD as PCP - Electrophysiology (Cardiology) Verl Blalock, Marijo Conception, MD (Inactive) (Cardiology)  Extended Emergency Contact Information Primary Emergency Contact: Darrol Jump Address: [address] [place] [address] Johnnette Litter of [place] Phone: [phone] Mobile Phone: [phone] Relation: Spouse Secondary Emergency Contact: Reganess,Dianne  [place] Phone: [phone] Relation: Daughter    Allergies: Iohexol, Ciprofloxacin, and Other  Chief Complaint  Patient presents with   Acute Visit    Patient is seen for orthostatic hypotension.    HPI: Patient is 86 y.o. male who is being seen today for meeting with his wife.  Patient is a history of orthostatic hypotension which is not new but patient's wife is very concerned about his.  Patient has been here for approximately 2 weeks and has had 3 lightheaded, presyncopal episodes that did not result in any injury..  Past Medical History:  Diagnosis Date   Allergic rhinitis 09/20/2014   Anemia, unspecified    Arthritis    Bladder neck obstruction    Cataracts, bilateral    Coronary atherosclerosis of unspecified type of vessel, native or graft    Diminished hearing, bilateral    Dysthymic disorder    GERD (gastroesophageal reflux disease)    Heart murmur    as a child   Impaired glucose tolerance 12/02/2012   Impotence of organic origin    Lumbar radiculopathy    Malignant melanoma of skin of upper limb, including shoulder (HCC)    Old myocardial infarction    Personal history of malignant melanoma of skin    Personal history of other diseases of digestive system    Thoracic or lumbosacral neuritis or  radiculitis, unspecified    Unspecified essential hypertension     Past Surgical History:  Procedure Laterality Date   ARTHRODESIS  07/06/2002   of left long finger distal interphalangeal joint with Kirschner wire fixation X 3   ATRIAL ABLATION SURGERY  05/04/2001   Dr. [name]   CARDIAC CATHETERIZATION  09/05/2008   Revealing 4 of 4 patent grafts with native multivessel coronary artery disease, EF  of 60% without regional wall motion abnormalities.   CATARACT EXTRACTION, BILATERAL     CHOLECYSTECTOMY     COLONOSCOPY     CORONARY ANGIOPLASTY  1993   CORONARY ARTERY BYPASS GRAFT  10/17/2000   Percell Miller B. [name], MD   EXPLORATORY LAPAROTOMY     AFTER LIVER TRAUMA AND HAND SURGERY   EYE SURGERY     HERNIA REPAIR     BILATERAL   LEFT HEART CATH AND CORS/GRAFTS ANGIOGRAPHY N/A 04/03/2017   Procedure: LEFT HEART CATH AND CORS/GRAFTS ANGIOGRAPHY;  Surgeon: Leonie Man, MD;  Location: [facility];  Service: Cardiovascular;  Laterality: N/A;   LUMBAR LAMINECTOMY/DECOMPRESSION MICRODISCECTOMY N/A 02/02/2016   Procedure: MICRODISCECTOMY LUMBAR FOUR- LUMBAR FIVE;  Surgeon: Ashok Pall, MD;  Location: [facility];  Service: Neurosurgery;  Laterality: N/A;  MICRODISCECTOMY L4-L5   LUMBAR LAMINECTOMY/DECOMPRESSION MICRODISCECTOMY Right 08/02/2016   Procedure: MICRODISCECTOMY LUMBAR FOUR- LUMBAR FIVE RIGHT;  Surgeon: Ashok Pall, MD;  Location: [facility];  Service: Neurosurgery;  Laterality: Right;   punctured eardrum      to relieve blood build up  for ear infection   TONSILLECTOMY      Allergies as of 08/30/2019      Reactions   Iohexol Other (See Comments)    Code: HIVES, Desc: PATENT STATES HE IS ALLERGIC TO IV DYE 09/14/08/RM, Onset Date: [id number]   Ciprofloxacin Hives, Rash   "Patient unsure" just know he has a reaction to med Medical record indicates allergic reaction   Other Other (See Comments)   UNSPECIFIED REACTION  Angioplasty dye pt.>> Pt only recalls Had  "very bad reaction"      Medication List       Accurate as of Aug 30, 2019 11:59 PM. If you have any questions, ask your nurse or doctor.        aspirin EC 81 MG tablet Take 81 mg by mouth daily after supper.   finasteride 5 MG tablet Commonly known as: PROSCAR Take 5 mg by mouth daily after supper.   gabapentin 300 MG capsule Commonly known as: NEURONTIN Take 300 mg by mouth daily after supper.   lisinopril 5 MG tablet Commonly known as: ZESTRIL Take 1 tablet (5 mg total) by mouth daily.   meclizine 12.5 MG tablet Commonly known as: ANTIVERT Take 1 tablet (12.5 mg total) by mouth 3 (three) times daily as needed for dizziness.   midodrine 10 MG tablet Commonly known as: PROAMATINE Take 10 mg by mouth 3 (three) times daily. What changed: Another medication with the same name was removed. Continue taking this medication, and follow the directions you see here. Changed by: Inocencio Homes, MD   nitroGLYCERIN 0.4 MG SL tablet Commonly known as: NITROSTAT Place 1 tablet (0.4 mg total) under the tongue every 5 (five) minutes as needed for chest pain (x 3 doses). Reported on 06/15/2015   NON FORMULARY DIET :Regular NAS   ondansetron 4 MG disintegrating tablet Commonly known as: Zofran ODT Take 1 tablet (4 mg total) by mouth every 8 (eight) hours as needed for nausea or vomiting.   rosuvastatin 10 MG tablet Commonly known as: CRESTOR Take 1 tablet (10 mg total) by mouth daily.   sertraline 100 MG tablet Commonly known as: ZOLOFT Take 100 mg by mouth daily.   tamsulosin 0.4 MG Caps capsule Commonly known as: Flomax Take 1 capsule (0.4 mg total) by mouth daily after supper.   tiZANidine 2 MG tablet Commonly known as: ZANAFLEX Take 2 mg by mouth See admin instructions. Take one tablet (2 mg) by mouth every morning, take one tablet (2 mg) later in the day as needed for pain   traMADol 50 MG tablet Commonly known as: ULTRAM Take 0.5 tablets (25 mg total) by mouth at  bedtime as needed. For 14 days for neck pain   vitamin B-12 1000 MCG tablet Commonly known as: CYANOCOBALAMIN Take 1 tablet (1,000 mcg total) by mouth daily.       No orders of the defined types were placed in this encounter.   Immunization History  Administered Date(s) Administered   Influenza, High Dose Seasonal PF 02/25/2013, 03/28/2018   Influenza,inj,Quad PF,6+ Mos 01/20/2014, 01/18/2015, 01/24/2016   PFIZER SARS-COV-2 Vaccination 06/25/2019, 07/21/2019   Pneumococcal Conjugate-13 02/03/2014   Pneumococcal Polysaccharide-23 02/11/2006   Zoster Recombinat (Shingrix) 04/03/2018    Social History   Tobacco Use   Smoking status: Former Smoker    Packs/day: 1.00    Years: 3.00    Pack years: 3.00    Types: Cigarettes    Quit date: 09/21/1955    Years since quitting: 9.9  Smokeless tobacco: Never Used  Substance Use Topics   Alcohol use: Yes    Alcohol/week: 0.0 standard drinks    Comment: one or two ounces a week    Review of Systems  GENERAL:  no fevers, fatigue, appetite changes SKIN: No itching, rash HEENT: No complaint RESPIRATORY: No cough, wheezing, SOB CARDIAC: No chest pain, palpitations, lower extremity edema; almost fainted today is just to see. GI: No abdominal pain, No N/V/D or constipation, No heartburn or reflux  GU: No dysuria, frequency or urgency, or incontinence  MUSCULOSKELETAL: No unrelieved bone/joint pain NEUROLOGIC: No headache, dizziness  PSYCHIATRIC: No overt anxiety or sadness  Vitals:   08/30/19 1628  BP: 135/72  Pulse: 70  Resp: 18  Temp: (!) 97 F (36.1 C)  SpO2: 98%   Body mass index is 25.4 kg/m. Physical Exam  GENERAL APPEARANCE: Alert, conversant, No acute distress  SKIN: No diaphoresis rash HEENT: Unremarkable RESPIRATORY: Breathing is even, unlabored. Lung sounds are clear   CARDIOVASCULAR: Heart RRR no murmurs, rubs or gallops. No peripheral edema  GASTROINTESTINAL: Abdomen is soft, non-tender, not  distended w/ normal bowel sounds.  GENITOURINARY: Bladder non tender, not distended  MUSCULOSKELETAL: No abnormal joints or musculature NEUROLOGIC: Cranial nerves 2-12 grossly intact. Moves all extremities PSYCHIATRIC: Mood and affect appropriate to situation, no behavioral issues  Patient Active Problem List   Diagnosis Date Noted   Postural dizziness with presyncope 08/13/2019   Hypertensive urgency 08/12/2019   Impacted cerumen of both ears 08/12/2019   Personal history of malignant melanoma of skin    Lumbar radiculopathy    Heart murmur    GERD (gastroesophageal reflux disease)    Diminished hearing, bilateral    Cataracts, bilateral    Bladder neck obstruction    Arthritis    Constipation 12/12/2016   Nausea 12/12/2016   Coronary artery disease involving native coronary artery of native heart without angina pectoris 11/22/2016   Dyslipidemia 11/22/2016   History of coronary artery bypass surgery 11/22/2016   Depression with anxiety    Orthostasis 09/20/2016   HNP (herniated nucleus pulposus), lumbar 02/02/2016   Spinal stenosis of lumbar region 01/24/2016   Sciatica, right side 01/24/2016   Prediabetes 01/24/2016   Otitis, externa, infective 01/24/2016   Left lumbar radiculopathy 01/15/2016   PVC's (premature ventricular contractions) 05/24/2015   Peripheral polyneuropathy 03/06/2015   BPPV (benign paroxysmal positional vertigo), right 03/06/2015   Headache 01/18/2015   Vertigo 01/18/2015   Allergic rhinitis 09/20/2014   Dyspnea on exertion 08/10/2014   Severe dizziness 08/10/2014   Diarrhea 05/18/2014   LLQ pain 05/13/2014   Chronic meniscal tear of knee 09/22/2013   Primary localized osteoarthrosis, lower leg 09/22/2013   Neck pain, bilateral 12/18/2012   Headache(784.0) 12/18/2012   Binocular visual disturbance 12/18/2012   Epiretinal membrane 12/17/2012   Status post intraocular lens implant 12/17/2012   Preventative  health care 12/02/2012   Low back pain 12/02/2012   Impaired glucose tolerance 12/02/2012   Palpitations 10/09/2010   B12 deficiency 08/30/2010   Hearing loss 08/30/2010   Tinnitus 08/30/2010   Neurogenic orthostatic hypotension ([place]) 06/28/2009   Mixed hyperlipidemia 05/15/2009   CHEST PAIN UNSPECIFIED 11/23/2008   Hypotension, unspecified 10/12/2008   DIZZINESS 10/12/2008   Shortness of breath 09/14/2008   UNSPECIFIED ANEMIA 09/13/2008   ANXIETY DEPRESSION 09/13/2008   Old myocardial infarction 09/13/2008   GASTROESOPHAGEAL REFLUX DISEASE, HX OF 09/13/2008   LUMBAR RADICULOPATHY, LEFT 08/23/2008   MELANOMA, SHOULDER 11/27/2006   Essential hypertension 11/27/2006  Coronary atherosclerosis 11/27/2006   BPH associated with nocturia 11/27/2006   ERECTILE DYSFUNCTION, ORGANIC 11/27/2006    CMP     Component Value Date/Time   NA 141 08/19/2019 0000   K 4.2 08/19/2019 0000   CL 106 08/19/2019 0000   CO2 19 08/19/2019 0000   GLUCOSE 112 (H) 08/15/2019 1725   GLUCOSE 113 (H) 03/18/2006 0839   BUN 14 08/19/2019 0000   CREATININE 0.6 08/19/2019 0000   CREATININE 0.80 08/15/2019 1725   CREATININE 0.86 10/23/2016 1211   CALCIUM 8.9 08/19/2019 0000   PROT 5.7 (L) 08/15/2019 1725   PROT 6.3 08/11/2019 0947   ALBUMIN 3.4 (L) 08/15/2019 1725   ALBUMIN 4.5 08/11/2019 0947   AST 23 08/15/2019 1725   ALT 23 08/15/2019 1725   ALKPHOS 43 08/15/2019 1725   BILITOT 0.9 08/15/2019 1725   BILITOT 0.5 08/11/2019 0947   GFRNONAA 90 08/19/2019 0000   GFRNONAA 81 10/23/2016 1211   GFRAA 90 08/19/2019 0000   GFRAA >89 10/23/2016 1211   Recent Labs    08/11/19 0947 08/11/19 0947 08/12/19 1340 08/13/19 0247 08/15/19 1725 08/19/19 0000  NA 141  --  140  --  141 141  K 3.9   < > 4.2  --  4.2 4.2  CL 105   < > 106  --  108 106  CO2 22   < > 24  --  27 19  GLUCOSE 142*  --  168*  --  112*  --   BUN 20  --  19  --  15 14  CREATININE 0.79   < > 0.76  --  0.80 0.6    CALCIUM 9.5   < > 9.5  --  8.8* 8.9  MG  --   --   --  2.0  --   --    < > = values in this interval not displayed.   Recent Labs    01/14/19 1457 08/11/19 0947 08/15/19 1725  AST 19 24 23   ALT 34 26 23  ALKPHOS 65 75 43  BILITOT 0.8 0.5 0.9  PROT 6.7 6.3 5.7*  ALBUMIN 4.9* 4.5 3.4*   Recent Labs    08/12/19 1340 08/12/19 1340 08/13/19 0247 08/15/19 1725 08/19/19 0000  WBC 10.1   < > 9.2 13.5* 9.7  NEUTROABS  --   --  6.9 10.7* 7  HGB 15.7   < > 14.0 13.7 14.8  HCT 47.8   < > 42.4 41.9 44  MCV 88.4  --  88.0 90.1  --   PLT 167   < > 144* 127* 149*   < > = values in this interval not displayed.   No results for input(s): CHOL, LDLCALC, TRIG in the last 8760 hours.  Invalid input(s): HCL No results found for: [facility] Lab Results  Component Value Date   TSH 2.884 08/13/2019   Lab Results  Component Value Date   HGBA1C 6.4 (H) 08/13/2019   Lab Results  Component Value Date   CHOL 110 01/24/2016   HDL 49.70 01/24/2016   LDLCALC 34 01/24/2016   TRIG 133.0 01/24/2016   CHOLHDL 2 01/24/2016    Significant Diagnostic Results in last 30 days:  MR Brain Wo Contrast (neuro protocol)  Result Date: 08/12/2019 CLINICAL DATA:  Dizziness, unsteady; ataxia, stroke suspected. Additional history provided: Patient reports dizziness when moving head, symptoms for several weeks. EXAM: MRI HEAD WITHOUT CONTRAST TECHNIQUE: Multiplanar, multiecho pulse sequences of the brain and surrounding structures  were obtained without intravenous contrast. COMPARISON:  Brain MRI 11/07/2016 FINDINGS: Brain: There is no evidence of acute infarct. No evidence of intracranial mass. No midline shift or extra-axial fluid collection. No chronic intracranial blood products. Redemonstrated advanced chronic small vessel ischemic disease within the cerebral white matter, basal ganglia and thalami. To a lesser degree, there are chronic small-vessel ischemic changes within the pons. Stable, moderate  generalized parenchymal atrophy. Vascular: Flow voids maintained within the proximal large arterial vessels. Skull and upper cervical spine: No focal marrow lesion. Sinuses/Orbits: Bilateral lens replacements. Trace paranasal sinus mucosal thickening greatest within bilateral ethmoid air cells. No significant mastoid effusion. IMPRESSION: No evidence of acute intracranial abnormality, including acute infarction. Redemonstrated advanced chronic small vessel ischemic changes within the cerebral white matter, basal ganglia and thalami. Mild chronic small vessel ischemic changes are also present within the pons. Stable, moderate generalized parenchymal atrophy. Electronically Signed   By: Kellie Simmering DO   On: 08/12/2019 19:12   ECHOCARDIOGRAM COMPLETE  Result Date: 08/13/2019    ECHOCARDIOGRAM REPORT   Patient Name:   CHAUN ELLENBERG Date of Exam: 08/13/2019 Medical Rec #:  [id number]         Height:       69.0 in Accession #:    [id number]        Weight:       172.0 lb Date of Birth:  [birth year]         BSA:          1.938 m Patient Age:    73 years          BP:           129/67 mmHg Patient Gender: M                 HR:           60 bpm. Exam Location:  Inpatient Procedure: 2D Echo Indications:    Syncope 780.2 / R55  History:        Patient has prior history of Echocardiogram examinations, most                 recent 08/16/2014. CAD and Previous Myocardial Infarction, Mitral                 Valve Prolapse; Signs/Symptoms:Murmur. GERD, anemia.  Sonographer:    Darlina Sicilian RDCS Referring Phys: [id number] ANKIT CHIRAG AMIN IMPRESSIONS  1. Left ventricular ejection fraction, by estimation, is 60 to 65%. The left ventricle has normal function. The left ventricle has no regional wall motion abnormalities. There is mild left ventricular hypertrophy. Left ventricular diastolic parameters are consistent with Grade II diastolic dysfunction (pseudonormalization). Elevated left ventricular end-diastolic pressure.  2. Right  ventricular systolic function is normal. The right ventricular size is mildly enlarged. There is mildly elevated pulmonary artery systolic pressure.  3. Left atrial size was mildly dilated.  4. The mitral valve is degenerative. Trivial mitral valve regurgitation. No evidence of mitral stenosis.  5. The aortic valve is tricuspid. Aortic valve regurgitation is trivial. No aortic stenosis is present.  6. The inferior vena cava is normal in size with greater than 50% respiratory variability, suggesting right atrial pressure of 3 mmHg. FINDINGS  Left Ventricle: Left ventricular ejection fraction, by estimation, is 60 to 65%. The left ventricle has normal function. The left ventricle has no regional wall motion abnormalities. The left ventricular internal cavity size was normal in size. There is  mild left ventricular hypertrophy. Left ventricular  diastolic parameters are consistent with Grade II diastolic dysfunction (pseudonormalization). Elevated left ventricular end-diastolic pressure. Right Ventricle: The right ventricular size is mildly enlarged. No increase in right ventricular wall thickness. Right ventricular systolic function is normal. There is mildly elevated pulmonary artery systolic pressure. The tricuspid regurgitant velocity is 3.03 m/s, and with an assumed right atrial pressure of 3 mmHg, the estimated right ventricular systolic pressure is [id number]. Left Atrium: Left atrial size was mildly dilated. Right Atrium: Right atrial size was normal in size. Pericardium: There is no evidence of pericardial effusion. Mitral Valve: The mitral valve is degenerative in appearance. There is mild thickening of the mitral valve leaflet(s). There is mild calcification of the mitral valve leaflet(s). Normal mobility of the mitral valve leaflets. Moderate mitral annular calcification. Trivial mitral valve regurgitation. No evidence of mitral valve stenosis. Tricuspid Valve: The tricuspid valve is normal in structure.  Tricuspid valve regurgitation is mild . No evidence of tricuspid stenosis. Aortic Valve: The aortic valve is tricuspid. Aortic valve regurgitation is trivial. No aortic stenosis is present. Pulmonic Valve: The pulmonic valve was normal in structure. Pulmonic valve regurgitation is mild. No evidence of pulmonic stenosis. Aorta: The aortic root is normal in size and structure. Venous: The inferior vena cava is normal in size with greater than 50% respiratory variability, suggesting right atrial pressure of 3 mmHg. IAS/Shunts: The interatrial septum was not well visualized.  LEFT VENTRICLE PLAX 2D LVIDd:         4.55 cm  Diastology LVIDs:         3.64 cm  LV e' lateral:   5.77 cm/s LV PW:         0.98 cm  LV E/e' lateral: 15.2 LV IVS:        1.36 cm  LV e' medial:    6.53 cm/s LVOT diam:     1.80 cm  LV E/e' medial:  13.4 LV SV:         30 LV SV Index:   15 LVOT Area:     2.54 cm  RIGHT VENTRICLE RV S prime:     10.10 cm/s TAPSE (M-mode): 1.3 cm LEFT ATRIUM           Index       RIGHT ATRIUM           Index LA diam:      4.00 cm 2.06 cm/m  RA Area:     11.20 cm LA Vol (A2C): 45.0 ml 23.22 ml/m RA Volume:   18.10 ml  9.34 ml/m LA Vol (A4C): 94.2 ml 48.61 ml/m  AORTIC VALVE LVOT Vmax:   48.00 cm/s LVOT Vmean:  37.900 cm/s LVOT VTI:    0.118 m  AORTA Ao Root diam: 3.30 cm Ao Asc diam:  3.30 cm MITRAL VALVE               TRICUSPID VALVE MV Area (PHT): 3.53 cm    TR Peak grad:   36.7 mmHg MV Decel Time: 215 msec    TR Vmax:        303.00 cm/s MV E velocity: 87.80 cm/s MV A velocity: 91.70 cm/s  SHUNTS MV E/A ratio:  0.96        Systemic VTI:  0.12 m                            Systemic Diam: 1.80 cm Jenkins Rouge MD Electronically signed by Jenkins Rouge MD  Signature Date/Time: 08/13/2019/11:38:28 AM    Final    VAS US CAROTID  Result Date: 08/13/2019 Carotid Arterial Duplex Study Indications:       Syncope. Comparison Study:  no prior Performing Technologist: Abram Sander RVS  Examination Guidelines: A complete  evaluation includes B-mode imaging, spectral Doppler, color Doppler, and power Doppler as needed of all accessible portions of each vessel. Bilateral testing is considered an integral part of a complete examination. Limited examinations for reoccurring indications may be performed as noted.  Right Carotid Findings: +----------+--------+--------+--------+------------------+--------+             PSV cm/s EDV cm/s Stenosis Plaque Description Comments  +----------+--------+--------+--------+------------------+--------+  CCA Prox   86       13                heterogenous                 +----------+--------+--------+--------+------------------+--------+  CCA Distal 53       11                heterogenous                 +----------+--------+--------+--------+------------------+--------+  ICA Prox   45       13       1-39%    heterogenous                 +----------+--------+--------+--------+------------------+--------+  ICA Distal 65       21                                             +----------+--------+--------+--------+------------------+--------+  ECA        70                                                      +----------+--------+--------+--------+------------------+--------+ +----------+--------+-------+--------+-------------------+             PSV cm/s EDV cms Describe Arm Pressure (mmHG)  +----------+--------+-------+--------+-------------------+  Subclavian 79                                             +----------+--------+-------+--------+-------------------+ +---------+--------+--+--------+--+---------+  Vertebral PSV cm/s 74 EDV cm/s 15 Antegrade  +---------+--------+--+--------+--+---------+  Left Carotid Findings: +----------+--------+--------+--------+------------------+--------+             PSV cm/s EDV cm/s Stenosis Plaque Description Comments  +----------+--------+--------+--------+------------------+--------+  CCA Prox   115      15                heterogenous                  +----------+--------+--------+--------+------------------+--------+  CCA Distal 50       9                 heterogenous                 +----------+--------+--------+--------+------------------+--------+  ICA Prox   62       18       1-39%    heterogenous                 +----------+--------+--------+--------+------------------+--------+  ICA Distal 67       22                                             +----------+--------+--------+--------+------------------+--------+  ECA        54                                                      +----------+--------+--------+--------+------------------+--------+ +----------+--------+--------+--------+-------------------+             PSV cm/s EDV cm/s Describe Arm Pressure (mmHG)  +----------+--------+--------+--------+-------------------+  Subclavian 71                                              +----------+--------+--------+--------+-------------------+ +---------+--------+--+--------+-+---------+  Vertebral PSV cm/s 24 EDV cm/s 9 Antegrade  +---------+--------+--+--------+-+---------+   Summary: Right Carotid: Velocities in the right ICA are consistent with a 1-39% stenosis. Left Carotid: Velocities in the left ICA are consistent with a 1-39% stenosis. Vertebrals: Bilateral vertebral arteries demonstrate antegrade flow. *See table(s) above for measurements and observations.  Electronically signed by Antony Contras MD on 08/13/2019 at 1:48:42 PM.    Final     Assessment and Plan  Orthostatic hypotension/benign positional vertigo/encounter for family conference without patient present-it appears the patient has had a long history of orthostatic hypotension, he has been worked up extensively and his admission to hospital and then SNF is not the first for this problem.  Estimated time speaking with the wife about how the problem occurs, why it occurs in the fact that no matter what kind of good control there is in the short-term and long-term care will be other episodes  of hypotension as well as other episodes of high blood pressure because of the treatment for low blood pressure.  There is no cure just management.  Patient has been on midodrine 5 mg 3 times daily and going to increase it to 10 mg 3 times daily, but I am also going to leave him on lisinopril 5 mg daily.  Also spoke to her about how patient's vertigo interacts with his orthostatic hypotension.  I am making his Antivert, which has been as needed now scheduled for 3 times daily.  We will continue to monitor.     Time spent greater than 35 minutes Hennie Duos, MD

## 2019-09-01 ENCOUNTER — Non-Acute Institutional Stay (SKILLED_NURSING_FACILITY): Payer: Medicare Other | Admitting: Internal Medicine

## 2019-09-01 ENCOUNTER — Encounter: Payer: Self-pay | Admitting: Internal Medicine

## 2019-09-01 DIAGNOSIS — I10 Essential (primary) hypertension: Secondary | ICD-10-CM

## 2019-09-01 DIAGNOSIS — I2510 Atherosclerotic heart disease of native coronary artery without angina pectoris: Secondary | ICD-10-CM

## 2019-09-01 DIAGNOSIS — E782 Mixed hyperlipidemia: Secondary | ICD-10-CM

## 2019-09-01 DIAGNOSIS — H8111 Benign paroxysmal vertigo, right ear: Secondary | ICD-10-CM | POA: Diagnosis not present

## 2019-09-01 DIAGNOSIS — R351 Nocturia: Secondary | ICD-10-CM

## 2019-09-01 DIAGNOSIS — R42 Dizziness and giddiness: Secondary | ICD-10-CM | POA: Diagnosis not present

## 2019-09-01 DIAGNOSIS — M542 Cervicalgia: Secondary | ICD-10-CM

## 2019-09-01 DIAGNOSIS — N401 Enlarged prostate with lower urinary tract symptoms: Secondary | ICD-10-CM

## 2019-09-01 NOTE — Progress Notes (Signed)
Location:    [facility] Room Number: 107/P Place of Service:  SNF (31)  Provider: Lorne Skeens  PCP: Antony Contras, MD Patient Care Team: Antony Contras, MD as PCP - General (Family Medicine) Dorothy Spark, MD as PCP - Cardiology (Cardiology) Deboraha Sprang, MD as PCP - Electrophysiology (Cardiology) Verl Blalock Marijo Conception, MD (Inactive) (Cardiology)  Extended Emergency Contact Information Primary Emergency Contact: Darrol Jump Address: [address] [place] [address] Johnnette Litter of [place] Phone: [phone] Mobile Phone: [phone] Relation: Spouse Secondary Emergency Contact: Sherrye Payor [place] of [place] Phone: [phone] Relation: Daughter  Code Status: DNR Goals of care:  Advanced Directive information Advanced Directives 09/01/2019  Does Patient Have a Medical Advance Directive? Yes  Type of Advance Directive Out of facility DNR (pink MOST or yellow form)  Does patient want to make changes to medical advance directive? No - Patient declined  Copy of [place] in Chart? -  Would patient like information on creating a medical advance directive? -  Pre-existing out of facility DNR order (yellow form or pink MOST form) Yellow form placed in chart (order not valid for inpatient use)     Allergies  Allergen Reactions  . Iohexol Other (See Comments)     Code: HIVES, Desc: PATENT STATES HE IS ALLERGIC TO IV DYE 09/14/08/RM, Onset Date: [id number]   . Ciprofloxacin Hives and Rash    "Patient unsure" just know he has a reaction to med Medical record indicates allergic reaction  . Other Other (See Comments)    UNSPECIFIED REACTION  Angioplasty dye pt.>> Pt only recalls Had "very bad reaction"    Chief Complaint  Patient presents with  . Discharge Note    Discharge Visit    HPI:  86 y.o. male seen today for discharge from facility slated for Friday, Sep 03, 2019.  He was here  for short-term rehab after hospitalization for dizziness and weakness.  In the hospital work-up for CVA was negative with carotid Doppler showing 1-39% bilateral stenosis.  TSH was within normal range.  It was thought this was worsening benign positional vertigo he was discharged on Antivert 12.5 mg 3 times a day as needed.  Apparently went home but did not do well-had difficulty ambulating with significant weakness and went back to the ER.  There was decided he was probably not suitable to go back home would need more intensive therapy and monitoring and thus he was admitted here to skilled nursing.  He has made improvements here although still does have some dizziness when ambulating.  I suspect this may be chronic.  His Toprol and hydralazine have been discontinued secondary to hypotension concerns possibly contributing to his dizziness.  He also has orders for midodrine 10 mg 3 times daily.  In regards to his other issues he was found to be borderline low B12 level in hospital and this is being supplemented this will need follow-up by primary care provider.  He also has a history of coronary artery disease and continues on aspirin 81 mg a day again his beta-blocker has been discontinued secondary to hypotension concerns-he is on Crestor 10 mg a day.  He does continue on low-dose lisinopril 5 mg a day.  He also complained of some nighttime neck pain he says this is chronic --apparently responded well to tramadol in the past and we have started on low-dose tramadol 25 mg at night if  needed.  He also has a history of neuropathy and continues on Neurontin 300 mg nightly.  Also a chronic history of muscle spasms he is on Zanaflex 2 mg in the morning and then can get an additional 2 mg later in the day if needed.  Currently he is sitting in his chair comfortably he is looking forward to going home-he still reports some dizziness when he walks at times-of note he does have Zofran  occasionally for nausea as well.  In regards to hypertension again he does continue on lisinopril 5 mg a day has variable blood pressures-this continues to be challenging with his intermittent hypotension and dizziness today his blood pressures 152/79-113/78 6 results sanitation social worker updated his staying over the systems resting*is is stable in the low back is again is no pneumothorax is is is is is is is COVID-19 positive test (U07.1, COVID-19) with Acute Respiratory Distress Syndrome (ARDS) (J80, ARDS) (If respiratory failure or sepsis present, add as separate assessment)  10,000 and recognize Pamala Hurry.  He will need continued PT OT-this will be provided by [facility] at home-he also will need expedient follow-up by primary care provider.        Past Medical History:  Diagnosis Date  . Allergic rhinitis 09/20/2014  . Anemia, unspecified   . Arthritis   . Bladder neck obstruction   . Cataracts, bilateral   . Coronary atherosclerosis of unspecified type of vessel, native or graft   . Diminished hearing, bilateral   . Dysthymic disorder   . GERD (gastroesophageal reflux disease)   . Heart murmur    as a child  . Impaired glucose tolerance 12/02/2012  . Impotence of organic origin   . Lumbar radiculopathy   . Malignant melanoma of skin of upper limb, including shoulder ([place])   . Old myocardial infarction   . Personal history of malignant melanoma of skin   . Personal history of other diseases of digestive system   . Thoracic or lumbosacral neuritis or radiculitis, unspecified   . Unspecified essential hypertension     Past Surgical History:  Procedure Laterality Date  . ARTHRODESIS  07/06/2002   of left long finger distal interphalangeal joint with Kirschner wire fixation X 3  . ATRIAL ABLATION SURGERY  05/04/2001   Dr. [name]  . CARDIAC CATHETERIZATION  09/05/2008   Revealing 4 of 4 patent grafts with native multivessel coronary artery disease, EF  of 60% without  regional wall motion abnormalities.  Marland Kitchen CATARACT EXTRACTION, BILATERAL    . CHOLECYSTECTOMY    . COLONOSCOPY    . CORONARY ANGIOPLASTY  1993  . CORONARY ARTERY BYPASS GRAFT  10/17/2000   [name]. [name], [place]     AFTER LIVER TRAUMA AND HAND SURGERY  . EYE SURGERY    . HERNIA REPAIR     BILATERAL  . LEFT HEART CATH AND CORS/GRAFTS ANGIOGRAPHY N/A 04/03/2017   Procedure: LEFT HEART CATH AND CORS/GRAFTS ANGIOGRAPHY;  Surgeon: Leonie Man, MD;  Location: [facility];  Service: Cardiovascular;  Laterality: N/A;  . LUMBAR LAMINECTOMY/DECOMPRESSION MICRODISCECTOMY N/A 02/02/2016   Procedure: MICRODISCECTOMY LUMBAR FOUR- LUMBAR FIVE;  Surgeon: Ashok Pall, MD;  Location: [facility];  Service: Neurosurgery;  Laterality: N/A;  MICRODISCECTOMY L4-L5  . LUMBAR LAMINECTOMY/DECOMPRESSION MICRODISCECTOMY Right 08/02/2016   Procedure: MICRODISCECTOMY LUMBAR FOUR- LUMBAR FIVE RIGHT;  Surgeon: Ashok Pall, MD;  Location: [facility];  Service: Neurosurgery;  Laterality: Right;  . punctured eardrum      to  relieve blood build up   for ear infection  . TONSILLECTOMY        reports that he quit smoking about 63 years ago. His smoking use included cigarettes. He has a 3.00 pack-year smoking history. He has never used smokeless tobacco. He reports current alcohol use. He reports that he does not use drugs. Social History   Socioeconomic History  . Marital status: Married    Spouse name: Not on file  . Number of children: 5  . Years of education: Not on file  . Highest education level: Not on file  Occupational History  . Not on file  Tobacco Use  . Smoking status: Former Smoker    Packs/day: 1.00    Years: 3.00    Pack years: 3.00    Types: Cigarettes    Quit date: 09/21/1955    Years since quitting: 63.9  . Smokeless tobacco: Never Used  Substance and Sexual Activity  . Alcohol use: Yes    Alcohol/week: 0.0 standard drinks    Comment: one or two ounces a week  . Drug  use: No  . Sexual activity: Not on file  Other Topics Concern  . Not on file  Social History Narrative   Lives with wife in a one story home.     Has 5 children.  (2 are deceased now) Retired Freight forwarder for [employer].     Education: 2 years of college.    Social Determinants of Health   Financial Resource Strain:   . Difficulty of Paying Living Expenses:   Food Insecurity:   . Worried About Charity fundraiser in the Last Year:   . Arboriculturist in the Last Year:   Transportation Needs:   . Film/video editor (Medical):   Marland Kitchen Lack of Transportation (Non-Medical):   Physical Activity:   . Days of Exercise per Week:   . Minutes of Exercise per Session:   Stress:   . Feeling of Stress :   Social Connections:   . Frequency of Communication with Friends and Family:   . Frequency of Social Gatherings with Friends and Family:   . Attends Religious Services:   . Active Member of Clubs or Organizations:   . Attends Archivist Meetings:   Marland Kitchen Marital Status:   Intimate Partner Violence:   . Fear of Current or Ex-Partner:   . Emotionally Abused:   Marland Kitchen Physically Abused:   . Sexually Abused:    Functional Status Survey:    Allergies  Allergen Reactions  . Iohexol Other (See Comments)     Code: HIVES, Desc: PATENT STATES HE IS ALLERGIC TO IV DYE 09/14/08/RM, Onset Date: [id number]   . Ciprofloxacin Hives and Rash    "Patient unsure" just know he has a reaction to med Medical record indicates allergic reaction  . Other Other (See Comments)    UNSPECIFIED REACTION  Angioplasty dye pt.>> Pt only recalls Had "very bad reaction"    Pertinent  Health Maintenance Due  Topic Date Due  . INFLUENZA VACCINE  11/28/2019  . PNA vac Low Risk Adult  Completed    Medications: Allergies as of 09/01/2019      Reactions   Iohexol Other (See Comments)    Code: HIVES, Desc: PATENT STATES HE IS ALLERGIC TO IV DYE 09/14/08/RM, Onset Date: [id number]   Ciprofloxacin Hives, Rash   "Patient  unsure" just know he has a reaction to med Medical record indicates allergic reaction   Other Other (See  Comments)   UNSPECIFIED REACTION  Angioplasty dye pt.>> Pt only recalls Had "very bad reaction"      Medication List       Accurate as of Sep 01, 2019  1:12 PM. If you have any questions, ask your nurse or doctor.        aspirin EC 81 MG tablet Take 81 mg by mouth daily after supper.   finasteride 5 MG tablet Commonly known as: PROSCAR Take 5 mg by mouth daily after supper.   gabapentin 300 MG capsule Commonly known as: NEURONTIN Take 300 mg by mouth daily after supper.   lisinopril 5 MG tablet Commonly known as: ZESTRIL Take 1 tablet (5 mg total) by mouth daily.   meclizine 12.5 MG tablet Commonly known as: ANTIVERT Take 1 tablet (12.5 mg total) by mouth 3 (three) times daily as needed for dizziness.   midodrine 10 MG tablet Commonly known as: PROAMATINE Take 10 mg by mouth 3 (three) times daily.   nitroGLYCERIN 0.4 MG SL tablet Commonly known as: NITROSTAT Place 1 tablet (0.4 mg total) under the tongue every 5 (five) minutes as needed for chest pain (x 3 doses). Reported on 06/15/2015   NON FORMULARY DIET :Regular NAS   ondansetron 4 MG disintegrating tablet Commonly known as: Zofran ODT Take 1 tablet (4 mg total) by mouth every 8 (eight) hours as needed for nausea or vomiting.   rosuvastatin 10 MG tablet Commonly known as: CRESTOR Take 1 tablet (10 mg total) by mouth daily.   sertraline 100 MG tablet Commonly known as: ZOLOFT Take 100 mg by mouth daily.   tamsulosin 0.4 MG Caps capsule Commonly known as: Flomax Take 1 capsule (0.4 mg total) by mouth daily after supper.   tiZANidine 2 MG tablet Commonly known as: ZANAFLEX Take 2 mg by mouth See admin instructions. Take one tablet (2 mg) by mouth every morning, take one tablet (2 mg) later in the day as needed for pain   traMADol 50 MG tablet Commonly known as: ULTRAM Take 0.5 tablets (25 mg total)  by mouth at bedtime as needed. For 14 days for neck pain   vitamin B-12 1000 MCG tablet Commonly known as: CYANOCOBALAMIN Take 1 tablet (1,000 mcg total) by mouth daily.       Review of Systems General is not complaining of any fever or chills.  Skin does not complain of rashes or itching at this time.  Head ears eyes nose mouth and throat is not complaining of visual changes or sore throat.  Respiratory does not complain of being short of breath or having a cough.  Cardiac does not complain of chest pain or edema.  GI does not complain of abdominal discomfort vomiting constipation-at times will have some nausea and he does have Zofran if needed I do not believe he is using this much.  GU is not complaining of dysuria.  Musculoskeletal continues with some weakness but has progressed here this is complicated with his dizziness episodes and vertigo.  Today she is very disappointed hospital records a year 60 days a year to 60 days there is a very long today  Neurologic is positive for some dizziness at times as noted above does not complain of headache-although at times he says he will have a intermittent headache in the morning-does not complain of numbness  Psych is not complaining of being depressed or anxious   Vitals:   09/01/19 1155  BP: 113/71  Pulse: 68  Resp: 18  Temp: 98  F (36.7 C)  TempSrc: Oral  SpO2: 98%  Weight: 172 lb (78 kg)  Height: 5\' 9"  (1.753 m)   Body mass index is 25.4 kg/m. Physical Exam   In general general general this is a pleasant elderly male in no distress sitting comfortably in his chair tomorrow.  His skin is warm and dry.  Eyes visual acuity appears to be intact sclera and conjunctive are clear.  Oropharynx is clear mucous membranes moist.  Chest is clear to auscultation there is no labored breathing.  Heart is regular rate and rhythm without murmur gallop or rub he does not have significant lower extremity edema.  Abdomen is  soft nontender with positive bowel sounds.  Musculoskeletal appears able to move all extremities x4 with baseline strength.  Neurologic cannot appreciate any lateralizing findings his speech is clear cranial nerves appear to be intact.  Psych he is alert and oriented pleasant and appropriate   Labs reviewed:  Sep 01, 2019.  Sodium 143 potassium 4.5 BUN 15.1 creatinine 0.57.   Basic Metabolic Panel: Recent Labs    08/11/19 0947 08/11/19 0947 08/12/19 1340 08/13/19 0247 08/15/19 1725 08/19/19 0000  NA 141  --  140  --  141 141  K 3.9   < > 4.2  --  4.2 4.2  CL 105   < > 106  --  108 106  CO2 22   < > 24  --  27 19  GLUCOSE 142*  --  168*  --  112*  --   BUN 20  --  19  --  15 14  CREATININE 0.79   < > 0.76  --  0.80 0.6  CALCIUM 9.5   < > 9.5  --  8.8* 8.9  MG  --   --   --  2.0  --   --    < > = values in this interval not displayed.   Liver Function Tests: Recent Labs    01/14/19 1457 08/11/19 0947 08/15/19 1725  AST 19 24 23   ALT 34 26 23  ALKPHOS 65 75 43  BILITOT 0.8 0.5 0.9  PROT 6.7 6.3 5.7*  ALBUMIN 4.9* 4.5 3.4*   No results for input(s): LIPASE, AMYLASE in the last 8760 hours. No results for input(s): AMMONIA in the last 8760 hours. CBC: Recent Labs    08/12/19 1340 08/12/19 1340 08/13/19 0247 08/15/19 1725 08/19/19 0000  WBC 10.1   < > 9.2 13.5* 9.7  NEUTROABS  --   --  6.9 10.7* 7  HGB 15.7   < > 14.0 13.7 14.8  HCT 47.8   < > 42.4 41.9 44  MCV 88.4  --  88.0 90.1  --   PLT 167   < > 144* 127* 149*   < > = values in this interval not displayed.   Cardiac Enzymes: No results for input(s): CKTOTAL, CKMB, CKMBINDEX, TROPONINI in the last 8760 hours. BNP: Invalid input(s): POCBNP CBG: Recent Labs    08/12/19 2129  GLUCAP 109*    Procedures and Imaging Studies During Stay: MR Brain Wo Contrast (neuro protocol)  Result Date: 08/12/2019 CLINICAL DATA:  Dizziness, unsteady; ataxia, stroke suspected. Additional history provided: Patient  reports dizziness when moving head, symptoms for several weeks. EXAM: MRI HEAD WITHOUT CONTRAST TECHNIQUE: Multiplanar, multiecho pulse sequences of the brain and surrounding structures were obtained without intravenous contrast. COMPARISON:  Brain MRI 11/07/2016 FINDINGS: Brain: There is no evidence of acute infarct. No evidence of intracranial  mass. No midline shift or extra-axial fluid collection. No chronic intracranial blood products. Redemonstrated advanced chronic small vessel ischemic disease within the cerebral white matter, basal ganglia and thalami. To a lesser degree, there are chronic small-vessel ischemic changes within the pons. Stable, moderate generalized parenchymal atrophy. Vascular: Flow voids maintained within the proximal large arterial vessels. Skull and upper cervical spine: No focal marrow lesion. Sinuses/Orbits: Bilateral lens replacements. Trace paranasal sinus mucosal thickening greatest within bilateral ethmoid air cells. No significant mastoid effusion. IMPRESSION: No evidence of acute intracranial abnormality, including acute infarction. Redemonstrated advanced chronic small vessel ischemic changes within the cerebral white matter, basal ganglia and thalami. Mild chronic small vessel ischemic changes are also present within the pons. Stable, moderate generalized parenchymal atrophy. Electronically Signed   By: Kellie Simmering DO   On: 08/12/2019 19:12   ECHOCARDIOGRAM COMPLETE  Result Date: 08/13/2019    ECHOCARDIOGRAM REPORT   Patient Name:   WORDEN SCHULTS Date of Exam: 08/13/2019 Medical Rec #:  [id number]         Height:       69.0 in Accession #:    [id number]        Weight:       172.0 lb Date of Birth:  December 10, 1931         BSA:          1.938 m Patient Age:    69 years          BP:           129/67 mmHg Patient Gender: M                 HR:           60 bpm. Exam Location:  Inpatient Procedure: 2D Echo Indications:    Syncope 780.2 / R55  History:        Patient has prior  history of Echocardiogram examinations, most                 recent 08/16/2014. CAD and Previous Myocardial Infarction, Mitral                 Valve Prolapse; Signs/Symptoms:Murmur. GERD, anemia.  Sonographer:    Darlina Sicilian RDCS Referring Phys: [id number] ANKIT CHIRAG AMIN IMPRESSIONS  1. Left ventricular ejection fraction, by estimation, is 60 to 65%. The left ventricle has normal function. The left ventricle has no regional wall motion abnormalities. There is mild left ventricular hypertrophy. Left ventricular diastolic parameters are consistent with Grade II diastolic dysfunction (pseudonormalization). Elevated left ventricular end-diastolic pressure.  2. Right ventricular systolic function is normal. The right ventricular size is mildly enlarged. There is mildly elevated pulmonary artery systolic pressure.  3. Left atrial size was mildly dilated.  4. The mitral valve is degenerative. Trivial mitral valve regurgitation. No evidence of mitral stenosis.  5. The aortic valve is tricuspid. Aortic valve regurgitation is trivial. No aortic stenosis is present.  6. The inferior vena cava is normal in size with greater than 50% respiratory variability, suggesting right atrial pressure of 3 mmHg. FINDINGS  Left Ventricle: Left ventricular ejection fraction, by estimation, is 60 to 65%. The left ventricle has normal function. The left ventricle has no regional wall motion abnormalities. The left ventricular internal cavity size was normal in size. There is  mild left ventricular hypertrophy. Left ventricular diastolic parameters are consistent with Grade II diastolic dysfunction (pseudonormalization). Elevated left ventricular end-diastolic pressure. Right Ventricle: The right ventricular size is mildly enlarged.  No increase in right ventricular wall thickness. Right ventricular systolic function is normal. There is mildly elevated pulmonary artery systolic pressure. The tricuspid regurgitant velocity is 3.03 m/s, and with  an assumed right atrial pressure of 3 mmHg, the estimated right ventricular systolic pressure is [id number]. Left Atrium: Left atrial size was mildly dilated. Right Atrium: Right atrial size was normal in size. Pericardium: There is no evidence of pericardial effusion. Mitral Valve: The mitral valve is degenerative in appearance. There is mild thickening of the mitral valve leaflet(s). There is mild calcification of the mitral valve leaflet(s). Normal mobility of the mitral valve leaflets. Moderate mitral annular calcification. Trivial mitral valve regurgitation. No evidence of mitral valve stenosis. Tricuspid Valve: The tricuspid valve is normal in structure. Tricuspid valve regurgitation is mild . No evidence of tricuspid stenosis. Aortic Valve: The aortic valve is tricuspid. Aortic valve regurgitation is trivial. No aortic stenosis is present. Pulmonic Valve: The pulmonic valve was normal in structure. Pulmonic valve regurgitation is mild. No evidence of pulmonic stenosis. Aorta: The aortic root is normal in size and structure. Venous: The inferior vena cava is normal in size with greater than 50% respiratory variability, suggesting right atrial pressure of 3 mmHg. IAS/Shunts: The interatrial septum was not well visualized.  LEFT VENTRICLE PLAX 2D LVIDd:         4.55 cm  Diastology LVIDs:         3.64 cm  LV e' lateral:   5.77 cm/s LV PW:         0.98 cm  LV E/e' lateral: 15.2 LV IVS:        1.36 cm  LV e' medial:    6.53 cm/s LVOT diam:     1.80 cm  LV E/e' medial:  13.4 LV SV:         30 LV SV Index:   15 LVOT Area:     2.54 cm  RIGHT VENTRICLE RV S prime:     10.10 cm/s TAPSE (M-mode): 1.3 cm LEFT ATRIUM           Index       RIGHT ATRIUM           Index LA diam:      4.00 cm 2.06 cm/m  RA Area:     11.20 cm LA Vol (A2C): 45.0 ml 23.22 ml/m RA Volume:   18.10 ml  9.34 ml/m LA Vol (A4C): 94.2 ml 48.61 ml/m  AORTIC VALVE LVOT Vmax:   48.00 cm/s LVOT Vmean:  37.900 cm/s LVOT VTI:    0.118 m  AORTA Ao Root  diam: 3.30 cm Ao Asc diam:  3.30 cm MITRAL VALVE               TRICUSPID VALVE MV Area (PHT): 3.53 cm    TR Peak grad:   36.7 mmHg MV Decel Time: 215 msec    TR Vmax:        303.00 cm/s MV E velocity: 87.80 cm/s MV A velocity: 91.70 cm/s  SHUNTS MV E/A ratio:  0.96        Systemic VTI:  0.12 m                            Systemic Diam: 1.80 cm Jenkins Rouge MD Electronically signed by Jenkins Rouge MD Signature Date/Time: 08/13/2019/11:38:28 AM    Final    VAS US CAROTID  Result Date: 08/13/2019 Carotid Arterial Duplex Study Indications:  Syncope. Comparison Study:  no prior Performing Technologist: Abram Sander RVS  Examination Guidelines: A complete evaluation includes B-mode imaging, spectral Doppler, color Doppler, and power Doppler as needed of all accessible portions of each vessel. Bilateral testing is considered an integral part of a complete examination. Limited examinations for reoccurring indications may be performed as noted.  Right Carotid Findings: +----------+--------+--------+--------+------------------+--------+           PSV cm/sEDV cm/sStenosisPlaque DescriptionComments +----------+--------+--------+--------+------------------+--------+ CCA Prox  86      13              heterogenous               +----------+--------+--------+--------+------------------+--------+ CCA Distal53      11              heterogenous               +----------+--------+--------+--------+------------------+--------+ ICA Prox  45      13      1-39%   heterogenous               +----------+--------+--------+--------+------------------+--------+ ICA Distal65      21                                         +----------+--------+--------+--------+------------------+--------+ ECA       70                                                 +----------+--------+--------+--------+------------------+--------+ +----------+--------+-------+--------+-------------------+           PSV  cm/sEDV cmsDescribeArm Pressure (mmHG) +----------+--------+-------+--------+-------------------+ [id number]                                         +----------+--------+-------+--------+-------------------+ +---------+--------+--+--------+--+---------+ VertebralPSV cm/s74EDV cm/s15Antegrade +---------+--------+--+--------+--+---------+  Left Carotid Findings: +----------+--------+--------+--------+------------------+--------+           PSV cm/sEDV cm/sStenosisPlaque DescriptionComments +----------+--------+--------+--------+------------------+--------+ CCA Prox  115     15              heterogenous               +----------+--------+--------+--------+------------------+--------+ CCA Distal50      9               heterogenous               +----------+--------+--------+--------+------------------+--------+ ICA Prox  62      18      1-39%   heterogenous               +----------+--------+--------+--------+------------------+--------+ ICA Distal67      22                                         +----------+--------+--------+--------+------------------+--------+ ECA       56                                                 +----------+--------+--------+--------+------------------+--------+ +----------+--------+--------+--------+-------------------+  PSV cm/sEDV cm/sDescribeArm Pressure (mmHG) +----------+--------+--------+--------+-------------------+ [id number]                                          +----------+--------+--------+--------+-------------------+ +---------+--------+--+--------+-+---------+ VertebralPSV cm/s24EDV cm/s9Antegrade +---------+--------+--+--------+-+---------+   Summary: Right Carotid: Velocities in the right ICA are consistent with a 1-39% stenosis. Left Carotid: Velocities in the left ICA are consistent with a 1-39% stenosis. Vertebrals: Bilateral vertebral arteries demonstrate antegrade flow. *See  table(s) above for measurements and observations.  Electronically signed by Antony Contras MD on 08/13/2019 at 1:48:42 PM.    Final     Assessment/Plan:    #1 history of dizziness thought to be vertigo-CVA was ruled out in the hospital lab work was unremarkable.  Carotid Doppler showed 1-39% bilateral stenosis.  He continues with as needed Antivert 12.5 mg 3 times a day.  For concerns of orthostatic hypotension continues on midodrine 10 mg 3 times a day as well.  He continues to have some dizziness when ambulating-there are suspicions this is probably chronic but will warrant follow-up by primary care provider with adjustments as needed.  He does have orders for as needed Zofran as well since he occasionally will get nauseous during these episodes.  2.  History of coronary artery disease this is been unremarkable during his stay here without complaints of chest pain or shortness of breath he is on aspirin 81 mg a day in addition to lisinopril 5 mg a day and Crestor 10 mg a day-his Toprol has been  discontinued because of hypotension concerns  He does have orders for nitroglycerin as needed for chest pain  #3 history of hypertension again this is complicated with his hypotension and dizziness concerns-he is now on lisinopril 5 mg a day his Toprol and hydralazine have been discontinued-appears to have variable blood pressures-this will warrant follow-up by primary care provider and continue monitoring.  4.  History of chronic muscle spasms he is on Zanaflex 2 mg routinely in the morning and as needed later in the day this appears to be helping.  5.  History of neck pain he relates some chronic neck pain more so at night-we have started low-dose tramadol 25 mg nightly as needed-this will need follow-up with possible refills by primary care provider.  6.  History of neuropathy he continues on Neurontin at night 300 mg a day.  7.  History of BPH he is on Proscar 5 mg a day as well as Flomax 0.4  mg a day-this also will warrant follow-up by primary care provider-with his hypotension concerns possibly may need some changes if hypotension persists-.  8.  History of depression-this appears to be stable on sertraline 100 mg a day.  9.  History of B12 borderline deficiency he is on 1000 mcg daily this will need follow-up by primary care provider.  He will be going home with family which is supportive he will need [facility]-PT and OT as well as expedient follow-up with primary care provider   [phone] note greater than 30 minutes spent on this discharge summary-greater than 50% of time spent coordinating and formulating plan of care for numerous diagnoses

## 2019-09-03 ENCOUNTER — Encounter: Payer: Self-pay | Admitting: Internal Medicine

## 2019-09-03 MED ORDER — NITROGLYCERIN 0.4 MG SL SUBL: 0.4000 mg | SUBLINGUAL_TABLET | SUBLINGUAL | 0 refills | Status: AC | PRN

## 2019-09-03 MED ORDER — LISINOPRIL 5 MG PO TABS: 5.0000 mg | ORAL_TABLET | Freq: Every day | ORAL | 0 refills | Status: DC

## 2019-09-03 MED ORDER — TIZANIDINE HCL 2 MG PO TABS: 2.0000 mg | ORAL_TABLET | ORAL | 0 refills | Status: DC

## 2019-09-03 MED ORDER — GABAPENTIN 300 MG PO CAPS: 300.0000 mg | ORAL_CAPSULE | Freq: Every day | ORAL | 0 refills | Status: DC

## 2019-09-03 MED ORDER — MECLIZINE HCL 12.5 MG PO TABS: 12.5000 mg | ORAL_TABLET | Freq: Three times a day (TID) | ORAL | 0 refills | Status: DC | PRN

## 2019-09-03 MED ORDER — TAMSULOSIN HCL 0.4 MG PO CAPS: 0.4000 mg | ORAL_CAPSULE | Freq: Every day | ORAL | 0 refills | Status: DC

## 2019-09-03 MED ORDER — TRAMADOL HCL 50 MG PO TABS: 25.0000 mg | ORAL_TABLET | Freq: Every evening | ORAL | 0 refills | Status: DC | PRN

## 2019-09-03 MED ORDER — FINASTERIDE 5 MG PO TABS: 5.0000 mg | ORAL_TABLET | Freq: Every day | ORAL | 0 refills | Status: AC

## 2019-09-03 MED ORDER — ONDANSETRON 4 MG PO TBDP: 4.0000 mg | ORAL_TABLET | Freq: Three times a day (TID) | ORAL | 0 refills | Status: DC | PRN

## 2019-09-03 MED ORDER — SERTRALINE HCL 100 MG PO TABS: 100.0000 mg | ORAL_TABLET | Freq: Every day | ORAL | 0 refills | Status: AC

## 2019-09-03 MED ORDER — MIDODRINE HCL 10 MG PO TABS: 10.0000 mg | ORAL_TABLET | Freq: Three times a day (TID) | ORAL | 0 refills | Status: DC

## 2019-09-03 MED ORDER — ROSUVASTATIN CALCIUM 10 MG PO TABS: 10.0000 mg | ORAL_TABLET | Freq: Every day | ORAL | 0 refills | Status: AC

## 2019-09-20 ENCOUNTER — Ambulatory Visit (INDEPENDENT_AMBULATORY_CARE_PROVIDER_SITE_OTHER): Payer: Medicare Other | Admitting: Neurology

## 2019-09-20 ENCOUNTER — Encounter: Payer: Self-pay | Admitting: Neurology

## 2019-09-20 ENCOUNTER — Other Ambulatory Visit: Payer: Self-pay

## 2019-09-20 VITALS — BP 133/72 | HR 58 | Resp 18 | Ht 69.0 in | Wt 173.0 lb

## 2019-09-20 DIAGNOSIS — G629 Polyneuropathy, unspecified: Secondary | ICD-10-CM

## 2019-09-20 DIAGNOSIS — R42 Dizziness and giddiness: Secondary | ICD-10-CM

## 2019-09-20 NOTE — Patient Instructions (Signed)
Good to see you and glad to see you are feeling better. Continue all your medications, use walker at all times. Follow-up in 6-8 months, call for any changes.

## 2019-09-20 NOTE — Progress Notes (Signed)
NEUROLOGY FOLLOW UP OFFICE NOTE  Troy Combs [id number] [birth year]  HISTORY OF PRESENT ILLNESS: I had the pleasure of seeing Troy Combs in follow-up in the neurology clinic on 09/20/2019. He is accompanied by his son who helps supplement the history today. The patient was last seen 10 months ago for dizziness. He had a bout of significant orthostatic hypotension after lumbar surgery in 2018, diagnosed by Cardiology with neurogenic orthostatic hypotension. This had resolved over time, mostly having imbalance when standing/walking due to neuropathy. He was in the hospital for 2 days in April 2021 with intense vertigo with vomiting which he reported started soon after his second Covid vaccine. He reported the room was spinning, worse when getting out of bed. He was back in the ER the day after hospital discharge due to diffuse weakness and went to rehab. BP medications were discontinued due to hypotension. He is on midodrine. He had made improvements in rehab, diagnosed with BPPV. He states vestibular therapy really helped.   He presents today saying he is feeling generally okay, feeling better. He has been home for a couple of weeks, mostly having dizziness on occasion if he stands up or turns his head too quickly. He denies any further spinning, mostly feeling lightheaded. He was doing PT the other day, stood up and felt very lightheaded, he closed his eyes, grimaces, symptoms resolved after a few minutes. He states that once his molar was gone, he has not had further nausea/vomiting. He has a 2/10 headache today. He reports sleep is good. He denies much numbness/tingling, he has occasional stabbing pain in the bottom of his foot. There may be tingling when he rubs his fingers. He is gabapentin 300mg  qhs. He denies any falls. They monitor BP at home, SBP ranges from 120-145, lowest 111. He denies any back pain currently, he is fine when sitting, but has significant pain when standing for 5  minutes. He has a walker with arm support.   HPI 02/2015: This is a pleasant 86 yo RH man who presented for vertigo and headaches. He had been previously seen by one of my partners, Dr. Tomi Combs in 2014 for cervicogenic headaches. He had vertigo that started in April/May 2016. He would have a sensation of tilting when he sat on the edge of the bed, with violent vomiting. He was prescribed meclizine, which would help sometimes. There was concern this was due to his beta-blocker, and after adjustment, he reported the intense dizziness went away, but he still had dizziness if moving too quickly, turning his head, or standing up. He was in the ER in September 2016 due to significant vertigo with dull headache. He had an MRI brain without contrast which I personally reviewed, no acute changes, there was chronic microvascular disease and generalized atrophy. He saw his PCP and was referred for vestibular rehab. He reports that vestibular rehab worked almost immediately. The dizziness has since resolved. He has a little lightheadedness and feels off balance and unsteady, no falls. He has occasional tingling and numbness in the last 3 digits of both feet, and occasionally pins and needles sensation in the soles of his feet. He has had monocular vertical diplopia for many years, and noticed that this was less when he takes nortriptyline. He had been having occipital headaches, but reported those are pretty much gone, with 1 to 2 over 10 pain, no associated nausea, vomiting, photo/phonophobia. He has some neck crepitus, "like gravel when turning my head," and occasional low back pain.  He has some constipation. No family history of similar symptoms.  He presented 10/23/16 for different symptoms that he and his wife report started after back surgery in April 2018. On review of notes, he had a durotomy while retracting the thecal sac. He was patched and kept on bedrest, and there was no evidence of leak through the skin. He was  discharged to rehab, and while in rehab he was noted to have orthostatic hypotension, with significant drop with exertion and associated dizziness. He was seen a few days later by Cardiology, and there is note that he has been known to have orthostatic hypotension and complained of exertional dizziness in the past. He agreed this was the case, but since surgery, symptoms have been much worse. He could not get to the door without feeling like he would pass out. He was noted to have a drop in SBP from 143 to 90 in the office. Coreg was discontinued. He was started on Florinef 0.1mg  daily. He saw cardiologist Dr. Caryl Combs last month, with note of orthostatic hypotension antedating back surgery, mechanism unclear but likely related to long-standing systolic hypertension. His supine pressures were over 170, and Losartan was started. Abdominal binder was advised. He continued to have worsening of dizziness with any activity, and was admitted to [facility] on 09/20/16, with note of 50-point drop from supine to standing. He became hypertensive in the ER. Florinef and BPH (Finasteride and Flomax) medications were discontinued and Losartan dose reduced, with note of improvement in orthostatic hypotension. Since hospital discharge, he would have very labile BP changes, 161/93 supine, 79/54 standing. They called Dr. Caryl Combs who recommended increasing Losartan back to 50mg , and restarting Flomax. He was using the abdominal binder. He was advised to start ProAmantine 5mg  every 4 hours while awake, then increased to 10mg  q4hrs. He reported wearing an abdominal binder, using compression stocking, taking midodrone, and stopping finasteride, but symptoms continue to worsen. He was referred to Neurology due to symptoms worsening after Neurosurgery with CSF leak.   He reports that he does not think he was getting this significant dizziness before the surgery. He described the dizziness as "disorientation more than anything." He has not passed  out. He has been having headaches but they are not positional in nature. They were more severe in rehab, still occurring every other day, improving with 2 Equates (aspirin, acetaminophen, caffeine). He feels pressure sometimes in the back of his head or diffusely, no associated nausea/vomiting, photo/phonophobia, it does not seem to improve when supine. He has chronic tinnitus that is unchanged. He has a history of peripheral neuropathy and denies any numbness or tingling in his extremities, but has new symptoms of muscle spasms in his arms, legs and back for the past 2 weeks. He has chronic vertical diplopia that is better with nortriptyline. He denies any dysphagia, dysarthria, bowel/bladder dysfunction, no back pain. They bring a calendar of his BP readings, this morning he went from 138/88 sitting (HR 82) to 80/52 standing (HR 97).   PAST MEDICAL HISTORY: Past Medical History:  Diagnosis Date  . Allergic rhinitis 09/20/2014  . Anemia, unspecified   . Arthritis   . Bladder neck obstruction   . Cataracts, bilateral   . Coronary atherosclerosis of unspecified type of vessel, native or graft   . Diminished hearing, bilateral   . Dysthymic disorder   . GERD (gastroesophageal reflux disease)   . Heart murmur    as a child  . Impaired glucose tolerance 12/02/2012  . Impotence of  organic origin   . Lumbar radiculopathy   . Malignant melanoma of skin of upper limb, including shoulder ([place])   . Old myocardial infarction   . Personal history of malignant melanoma of skin   . Personal history of other diseases of digestive system   . Thoracic or lumbosacral neuritis or radiculitis, unspecified   . Unspecified essential hypertension     MEDICATIONS: Current Outpatient Medications on File Prior to Visit  Medication Sig Dispense Refill  . aspirin EC 81 MG tablet Take 81 mg by mouth daily after supper.     . finasteride (PROSCAR) 5 MG tablet Take 1 tablet (5 mg total) by mouth daily after supper. 30  tablet 0  . gabapentin (NEURONTIN) 300 MG capsule Take 1 capsule (300 mg total) by mouth daily after supper. 30 capsule 0  . lisinopril (ZESTRIL) 5 MG tablet Take 1 tablet (5 mg total) by mouth daily. 30 tablet 0  . meclizine (ANTIVERT) 12.5 MG tablet Take 1 tablet (12.5 mg total) by mouth 3 (three) times daily as needed for dizziness. 90 tablet 0  . midodrine (PROAMATINE) 10 MG tablet Take 1 tablet (10 mg total) by mouth 3 (three) times daily. 90 tablet 0  . nitroGLYCERIN (NITROSTAT) 0.4 MG SL tablet Place 1 tablet (0.4 mg total) under the tongue every 5 (five) minutes as needed for chest pain (x 3 doses). Reported on 06/15/2015 25 tablet 0  . NON FORMULARY DIET :Regular NAS    . ondansetron (ZOFRAN ODT) 4 MG disintegrating tablet Take 1 tablet (4 mg total) by mouth every 8 (eight) hours as needed for nausea or vomiting. 30 tablet 0  . rosuvastatin (CRESTOR) 10 MG tablet Take 1 tablet (10 mg total) by mouth daily. 30 tablet 0  . sertraline (ZOLOFT) 100 MG tablet Take 1 tablet (100 mg total) by mouth daily. 30 tablet 0  . tamsulosin (FLOMAX) 0.4 MG CAPS capsule Take 1 capsule (0.4 mg total) by mouth daily after supper. 30 capsule 0  . tiZANidine (ZANAFLEX) 2 MG tablet Take 1 tablet (2 mg total) by mouth See admin instructions. Take one tablet (2 mg) by mouth every morning, take one tablet (2 mg) later in the day as needed for pain 45 tablet 0  . traMADol (ULTRAM) 50 MG tablet Take 0.5 tablets (25 mg total) by mouth at bedtime as needed. For 14 days for neck pain 7 tablet 0  . vitamin B-12 (CYANOCOBALAMIN) 1000 MCG tablet Take 1 tablet (1,000 mcg total) by mouth daily. 90 tablet 0  . amoxicillin (AMOXIL) 500 MG tablet Take 500 mg by mouth 3 (three) times daily.     No current facility-administered medications on file prior to visit.    ALLERGIES: Allergies  Allergen Reactions  . Iohexol Other (See Comments)     Code: HIVES, Desc: PATENT STATES HE IS ALLERGIC TO IV DYE 09/14/08/RM, Onset Date:  [id number]   . Ciprofloxacin Hives and Rash    "Patient unsure" just know he has a reaction to med Medical record indicates allergic reaction  . Other Other (See Comments)    UNSPECIFIED REACTION  Angioplasty dye pt.>> Pt only recalls Had "very bad reaction"    FAMILY HISTORY: Family History  Problem Relation Age of Onset  . Heart disease Father   . Ataxia Neg Hx   . Chorea Neg Hx   . Dementia Neg Hx   . Mental retardation Neg Hx   . Migraines Neg Hx   . Multiple sclerosis Neg Hx   .  Neurofibromatosis Neg Hx   . Neuropathy Neg Hx   . Parkinsonism Neg Hx   . Seizures Neg Hx   . Stroke Neg Hx     SOCIAL HISTORY: Social History   Socioeconomic History  . Marital status: Married    Spouse name: Not on file  . Number of children: 5  . Years of education: Not on file  . Highest education level: Not on file  Occupational History  . Occupation: retired  Tobacco Use  . Smoking status: Former Smoker    Packs/day: 1.00    Years: 3.00    Pack years: 3.00    Types: Cigarettes    Quit date: 09/21/1955    Years since quitting: 64.0  . Smokeless tobacco: Never Used  Substance and Sexual Activity  . Alcohol use: Yes    Alcohol/week: 0.0 standard drinks    Comment: one or two ounces a week  . Drug use: No  . Sexual activity: Not on file  Other Topics Concern  . Not on file  Social History Narrative   Lives with wife in a one story home.     Has 5 children.  (2 are deceased now) Retired Freight forwarder for [employer].     Education: 2 years of college.    Social Determinants of Health   Financial Resource Strain:   . Difficulty of Paying Living Expenses:   Food Insecurity:   . Worried About Charity fundraiser in the Last Year:   . Arboriculturist in the Last Year:   Transportation Needs:   . Film/video editor (Medical):   Marland Kitchen Lack of Transportation (Non-Medical):   Physical Activity:   . Days of Exercise per Week:   . Minutes of Exercise per Session:   Stress:   . Feeling of  Stress :   Social Connections:   . Frequency of Communication with Friends and Family:   . Frequency of Social Gatherings with Friends and Family:   . Attends Religious Services:   . Active Member of Clubs or Organizations:   . Attends Archivist Meetings:   Marland Kitchen Marital Status:   Intimate Partner Violence:   . Fear of Current or Ex-Partner:   . Emotionally Abused:   Marland Kitchen Physically Abused:   . Sexually Abused:     PHYSICAL EXAM: Vitals:   09/20/19 1041  BP: 133/72  Pulse: (!) 58  Resp: 18  SpO2: 95%   General: No acute distress Head:  Normocephalic/atraumatic Skin/Extremities: No rash, no edema Neurological Exam: alert and oriented to person, place, and time. No aphasia or dysarthria. Fund of knowledge is appropriate.  Recent and remote memory are intact.  Attention and concentration are normal.  Cranial nerves: Pupils equal, round, reactive to light.  Extraocular movements intact with no nystagmus. Visual fields full. Facial sensation intact. No facial asymmetry. Motor: Bulk and tone normal, muscle strength 5/5 throughout with no pronator drift.  Sensation intact to all modalities on both UE. He has decreased cold, pin, and vibration sense to knees. Deep tendon reflexes unable to elicit throughout. Finger to nose testing intact.  Gait slow and cautious with walker, no ataxia.   IMPRESSION: This is a pleasant 87 yo RH man with a history of hypertension, CAD s/p MI and CABG, neurogenic orthostatic hypotension, cervicogenic headaches, neuropathy. He was in the hospital for vertigo last month, significantly improved after vestibular therapy. He continues to have some occasional lightheadedness with changes in position and takes midodrine for orthostatic  hypotension. We discussed his exam again showing evidence of a length-dependent neuropathy, which causes balance issues. His prior EMG/NCV was normal, however this can be normal with small fiber neuropathy. He is overall feeling  better, ambulates with walker. Continue all medications. Follow-up in 6-8 months, he knows to call for any changes.   Thank you for allowing me to participate in his care.  Please do not hesitate to call for any questions or concerns.   Ellouise Newer, M.D.   CC: Dr. Moreen Fowler

## 2019-09-23 ENCOUNTER — Other Ambulatory Visit: Payer: Self-pay | Admitting: Internal Medicine

## 2019-09-23 DIAGNOSIS — I10 Essential (primary) hypertension: Secondary | ICD-10-CM

## 2019-09-23 DIAGNOSIS — R42 Dizziness and giddiness: Secondary | ICD-10-CM

## 2019-09-25 ENCOUNTER — Other Ambulatory Visit: Payer: Self-pay | Admitting: Internal Medicine

## 2019-09-28 ENCOUNTER — Other Ambulatory Visit: Payer: Self-pay | Admitting: Internal Medicine

## 2019-09-30 ENCOUNTER — Other Ambulatory Visit: Payer: Self-pay | Admitting: Internal Medicine

## 2019-10-02 ENCOUNTER — Other Ambulatory Visit: Payer: Self-pay | Admitting: Internal Medicine

## 2019-10-04 ENCOUNTER — Other Ambulatory Visit: Payer: Self-pay

## 2019-10-04 MED ORDER — MIDODRINE HCL 10 MG PO TABS: 10.0000 mg | ORAL_TABLET | Freq: Three times a day (TID) | ORAL | 0 refills | Status: DC

## 2019-10-19 ENCOUNTER — Encounter: Payer: Self-pay | Admitting: Neurology

## 2019-10-19 ENCOUNTER — Ambulatory Visit (INDEPENDENT_AMBULATORY_CARE_PROVIDER_SITE_OTHER): Payer: Medicare Other | Admitting: Neurology

## 2019-10-19 ENCOUNTER — Other Ambulatory Visit: Payer: Self-pay

## 2019-10-19 VITALS — BP 111/65 | HR 68 | Ht 69.0 in | Wt 174.4 lb

## 2019-10-19 DIAGNOSIS — G629 Polyneuropathy, unspecified: Secondary | ICD-10-CM | POA: Diagnosis not present

## 2019-10-19 DIAGNOSIS — R42 Dizziness and giddiness: Secondary | ICD-10-CM

## 2019-10-19 NOTE — Patient Instructions (Signed)
Good to see you! Continue with vestibular therapy. If the sleep behaviors continue, try taking melatonin 3mg  at bedtime. Recommend using walker at ALL times. Follow-up in 6 months, call for any changes.

## 2019-10-19 NOTE — Progress Notes (Signed)
NEUROLOGY FOLLOW UP OFFICE NOTE  Troy Combs [id number] 01/11/1981  HISTORY OF PRESENT ILLNESS: I had the pleasure of seeing Troy Combs in follow-up in the neurology clinic on 10/19/2019.  The patient was last seen just a month ago for dizziness/neuropathy. He had vertigo which resolved with vestibular rehab. He is accompanied by his wife who helps supplement the history today. He presents for an urgent follow-up due to vertigo and falls. It appears they were unable to get back to vestibular rehab initially and requested for a neurology visit due to this, however he was then able to get vestibular therapy yesterday at home. He had 2 episodes of vertigo 2-3 weeks ago that occurred within 3 days of each other. The first one occurred when he was getting out of bed, he fell on the floor, no loss of consciousness. He called to his wife, he apparently slid out of bed onto his back and hit his head, no pain. His wife could not lift him so their neighbor had to be called to help. The second occurred while he was in the closet, he turned around to enter the closet portion then spun and fell. He fell backward then took the closet door on his way down. EMS was called. He had worsened dizziness with vomiting the next morning. He had 5 more incidents of vertigo getting up from the edge of the bed, with vomiting. He has been doing PT at home but was awaiting their vestibular therapist, who finally came yesterday, coming again tomorrow. So far he has not sensed any change but is not dizzy today. He was also concerned because there were 3-4 mornings in a row where he woke up in the middle of a hallucination/dream, someone was handing him something and he woke up as he was reaching up in the air. He reports the exact sequence happened 3-4 times in a row. He does not typically act out his dreams, so this was concerning. He sleeps very well but does not have a lot of energy.   History on Initial Assessment  03/03/2015: This is a pleasant 86 yo RH man who presented for vertigo and headaches. He had been previously seen by one of my partners, Dr. [name] in 2014 for cervicogenic headaches. He had vertigo that started in April/May 2016. He would have a sensation of tilting when he sat on the edge of the bed, with violent vomiting. He was prescribed meclizine, which would help sometimes. There was concern this was due to his beta-blocker, and after adjustment, he reported the intense dizziness went away, but he still had dizziness if moving too quickly, turning his head, or standing up. He was in the ER in September 2016 due to significant vertigo with dull headache. He had an MRI brain without contrast which I personally reviewed, no acute changes, there was chronic microvascular disease and generalized atrophy. He saw his PCP and was referred for vestibular rehab. He reports that vestibular rehab worked almost immediately. The dizziness has since resolved. He has a little lightheadedness and feels off balance and unsteady, no falls. He has occasional tingling and numbness in the last 3 digits of both feet, and occasionally pins and needles sensation in the soles of his feet. He has had monocular vertical diplopia for many years, and noticed that this was less when he takes nortriptyline. He had been having occipital headaches, but reported those are pretty much gone, with 1 to 2 over 10 pain, no associated nausea, vomiting,  photo/phonophobia. He has some neck crepitus, "like gravel when turning my head," and occasional low back pain. He has some constipation. No family history of similar symptoms.  He presented 10/23/16 for different symptoms that he and his wife report started after back surgery in April 2018. On review of notes, he had a durotomy while retracting the thecal sac. He was patched and kept on bedrest, and there was no evidence of leak through the skin. He was discharged to rehab, and while in rehab he was  noted to have orthostatic hypotension, with significant drop with exertion and associated dizziness. He was seen a few days later by Cardiology, and there is note that he has been known to have orthostatic hypotension and complained of exertional dizziness in the past. He agreed this was the case, but since surgery, symptoms have been much worse. He could not get to the door without feeling like he would pass out. He was noted to have a drop in SBP from 143 to 90 in the office. Coreg was discontinued. He was started on Florinef 0.1mg  daily. He saw cardiologist [name] last month, with note of orthostatic hypotension antedating back surgery, mechanism unclear but likely related to long-standing systolic hypertension. His supine pressures were over 170, and Losartan was started. Abdominal binder was advised. He continued to have worsening of dizziness with any activity, and was admitted to [facility] on 09/20/16, with note of 50-point drop from supine to standing. He became hypertensive in the ER. Florinef and BPH (Finasteride and Flomax) medications were discontinued and Losartan dose reduced, with note of improvement in orthostatic hypotension. Since hospital discharge, he would have very labile BP changes, 161/93 supine, 79/54 standing. They called [name] who recommended increasing Losartan back to 50mg , and restarting Flomax. He was using the abdominal binder. He was advised to start ProAmantine 5mg  every 4 hours while awake, then increased to 10mg  q4hrs. He reported wearing an abdominal binder, using compression stocking, taking midodrone, and stopping finasteride, but symptoms continue to worsen. He was referred to Neurology due to symptoms worsening after Neurosurgery with CSF leak.   He reports that he does not think he was getting this significant dizziness before the surgery. He described the dizziness as "disorientation more than anything." He has not passed out. He has been having headaches but they are not  positional in nature. They were more severe in rehab, still occurring every other day, improving with 2 Equates (aspirin, acetaminophen, caffeine). He feels pressure sometimes in the back of his head or diffusely, no associated nausea/vomiting, photo/phonophobia, it does not seem to improve when supine. He has chronic tinnitus that is unchanged. He has a history of peripheral neuropathy and denies any numbness or tingling in his extremities, but has new symptoms of muscle spasms in his arms, legs and back for the past 2 weeks. He has chronic vertical diplopia that is better with nortriptyline. He denies any dysphagia, dysarthria, bowel/bladder dysfunction, no back pain. They bring a calendar of his BP readings, this morning he went from 138/88 sitting (HR 82) to 80/52 standing (HR 97).    PAST MEDICAL HISTORY: Past Medical History:  Diagnosis Date  . Allergic rhinitis 09/20/2014  . Anemia, unspecified   . Arthritis   . Bladder neck obstruction   . Cataracts, bilateral   . Coronary atherosclerosis of unspecified type of vessel, native or graft   . Diminished hearing, bilateral   . Dysthymic disorder   . GERD (gastroesophageal reflux disease)   .  Heart murmur    as a child  . Impaired glucose tolerance 12/02/2012  . Impotence of organic origin   . Lumbar radiculopathy   . Malignant melanoma of skin of upper limb, including shoulder ([place])   . Old myocardial infarction   . Personal history of malignant melanoma of skin   . Personal history of other diseases of digestive system   . Thoracic or lumbosacral neuritis or radiculitis, unspecified   . Unspecified essential hypertension     MEDICATIONS: Current Outpatient Medications on File Prior to Visit  Medication Sig Dispense Refill  . amoxicillin (AMOXIL) 500 MG tablet Take 500 mg by mouth 3 (three) times daily.    Marland Kitchen aspirin EC 81 MG tablet Take 81 mg by mouth daily after supper.     . finasteride (PROSCAR) 5 MG tablet Take 1 tablet (5 mg  total) by mouth daily after supper. 30 tablet 0  . gabapentin (NEURONTIN) 300 MG capsule Take 1 capsule (300 mg total) by mouth daily after supper. 30 capsule 0  . lisinopril (ZESTRIL) 5 MG tablet Take 1 tablet (5 mg total) by mouth daily. 30 tablet 0  . meclizine (ANTIVERT) 12.5 MG tablet Take 1 tablet (12.5 mg total) by mouth 3 (three) times daily as needed for dizziness. 90 tablet 0  . midodrine (PROAMATINE) 10 MG tablet Take 1 tablet (10 mg total) by mouth 3 (three) times daily. 90 tablet 0  . nitroGLYCERIN (NITROSTAT) 0.4 MG SL tablet Place 1 tablet (0.4 mg total) under the tongue every 5 (five) minutes as needed for chest pain (x 3 doses). Reported on 06/15/2015 25 tablet 0  . NON FORMULARY DIET :Regular NAS    . ondansetron (ZOFRAN ODT) 4 MG disintegrating tablet Take 1 tablet (4 mg total) by mouth every 8 (eight) hours as needed for nausea or vomiting. 30 tablet 0  . rosuvastatin (CRESTOR) 10 MG tablet Take 1 tablet (10 mg total) by mouth daily. 30 tablet 0  . sertraline (ZOLOFT) 100 MG tablet Take 1 tablet (100 mg total) by mouth daily. 30 tablet 0  . tamsulosin (FLOMAX) 0.4 MG CAPS capsule Take 1 capsule (0.4 mg total) by mouth daily after supper. 30 capsule 0  . tiZANidine (ZANAFLEX) 2 MG tablet Take 1 tablet (2 mg total) by mouth See admin instructions. Take one tablet (2 mg) by mouth every morning, take one tablet (2 mg) later in the day as needed for pain 45 tablet 0  . traMADol (ULTRAM) 50 MG tablet Take 0.5 tablets (25 mg total) by mouth at bedtime as needed. For 14 days for neck pain 7 tablet 0  . vitamin B-12 (CYANOCOBALAMIN) 1000 MCG tablet Take 1 tablet (1,000 mcg total) by mouth daily. 90 tablet 0   No current facility-administered medications on file prior to visit.    ALLERGIES: Allergies  Allergen Reactions  . Iohexol Other (See Comments)     Code: HIVES, Desc: PATENT STATES HE IS ALLERGIC TO IV DYE 09/14/08/RM, Onset Date: [id number]   . Ciprofloxacin Hives and Rash     "Patient unsure" just know he has a reaction to med Medical record indicates allergic reaction  . Other Other (See Comments)    UNSPECIFIED REACTION  Angioplasty dye pt.>> Pt only recalls Had "very bad reaction"    FAMILY HISTORY: Family History  Problem Relation Age of Onset  . Heart disease Father   . Ataxia Neg Hx   . Chorea Neg Hx   . Dementia Neg Hx   .  Mental retardation Neg Hx   . Migraines Neg Hx   . Multiple sclerosis Neg Hx   . Neurofibromatosis Neg Hx   . Neuropathy Neg Hx   . Parkinsonism Neg Hx   . Seizures Neg Hx   . Stroke Neg Hx     SOCIAL HISTORY: Social History   Socioeconomic History  . Marital status: Married    Spouse name: Not on file  . Number of children: 5  . Years of education: Not on file  . Highest education level: Not on file  Occupational History  . Occupation: retired  Tobacco Use  . Smoking status: Former Smoker    Packs/day: 1.00    Years: 3.00    Pack years: 3.00    Types: Cigarettes    Quit date: 09/21/1955    Years since quitting: 64.1  . Smokeless tobacco: Never Used  Vaping Use  . Vaping Use: Never used  Substance and Sexual Activity  . Alcohol use: Yes    Alcohol/week: 0.0 standard drinks    Comment: one or two ounces a week  . Drug use: No  . Sexual activity: Not on file  Other Topics Concern  . Not on file  Social History Narrative   Lives with wife in a one story home.     Has 5 children.  (2 are deceased now) Retired Freight forwarder for [employer].     Education: 2 years of college.    Social Determinants of Health   Financial Resource Strain:   . Difficulty of Paying Living Expenses:   Food Insecurity:   . Worried About Charity fundraiser in the Last Year:   . Arboriculturist in the Last Year:   Transportation Needs:   . Film/video editor (Medical):   Marland Kitchen Lack of Transportation (Non-Medical):   Physical Activity:   . Days of Exercise per Week:   . Minutes of Exercise per Session:   Stress:   . Feeling of Stress  :   Social Connections:   . Frequency of Communication with Friends and Family:   . Frequency of Social Gatherings with Friends and Family:   . Attends Religious Services:   . Active Member of Clubs or Organizations:   . Attends Archivist Meetings:   Marland Kitchen Marital Status:   Intimate Partner Violence:   . Fear of Current or Ex-Partner:   . Emotionally Abused:   Marland Kitchen Physically Abused:   . Sexually Abused:     PHYSICAL EXAM: Vitals:   10/19/19 1535  BP: 111/65  Pulse: 68  SpO2: 96%   General: No acute distress Head:  Normocephalic/atraumatic Skin/Extremities: No rash, no edema Neurological Exam: alert and oriented to person, place, and time. No aphasia or dysarthria. Fund of knowledge is appropriate.  Recent and remote memory are intact.  Attention and concentration are normal.   Cranial nerves: Pupils equal, round, reactive to light.  Extraocular movements intact with no nystagmus. Visual fields full. No facial asymmetry.  Motor: Bulk and tone normal, no cogwheeling, muscle strength 5/5 throughout with no pronator drift.  Sensation decreased in both LE.  Deep tendon reflexes unable to elicit throughout. Finger to nose testing intact.  Gait slow and cautious with walker. No tremor. Vestibu,   IMPRESSION: This is a pleasant 86 yo RH man with a history of hypertension, CAD s/p MI and CABG, neurogenic orthostatic hypotension, cervicogenic headaches, neuropathy. He presents for an earlier visit due to recurrence of vertigo with associated falls, he  had initially been unable to get vestibular therapy, but started it yesterday. Continue with vestibular therapy, this was previously helpful for vertigo. He also had 3-4 episodes suggestive of REM behavior disorder, no other signs of parkinsonism seen. Continue to monitor, if recurrent, can try melatonin. He has significant neuropathy and knows to use his walker at all times. Follow-up in 6 months, they know to call for any changes.   Thank you  for allowing me to participate in his care.  Please do not hesitate to call for any questions or concerns.   Ellouise Newer, M.D.   CC: Dr. Moreen Fowler

## 2019-10-28 ENCOUNTER — Other Ambulatory Visit: Payer: Self-pay | Admitting: Cardiology

## 2019-11-06 ENCOUNTER — Encounter: Payer: Self-pay | Admitting: Neurology

## 2019-11-11 ENCOUNTER — Ambulatory Visit: Payer: Medicare Other | Attending: Family Medicine | Admitting: Physical Therapy

## 2019-11-26 ENCOUNTER — Ambulatory Visit: Payer: Medicare Other | Admitting: Neurology

## 2020-02-18 ENCOUNTER — Encounter: Payer: Self-pay | Admitting: Physician Assistant

## 2020-02-18 NOTE — Progress Notes (Addendum)
Cardiology Office Note    Date:  02/22/2020   ID:  CHANCEY RINGEL, DOB 1932/03/09, MRN [id number]  PCP:  Antony Contras, MD  Cardiologist:  Ena Dawley, MD  Electrophysiologist:  Virl Axe, MD   Chief Complaint: f/u CAD, PVCs  History of Present Illness:   Troy Combs is a 86 y.o. male with history of CAD s/p CABG x4V (2002), HTN, atrial flutter s/p remote RFCA, known orthostatic hypotension, symptomatic PVCs treated with beta blocker, BPPV, allergic rhinitis, anemia, arthritis, diminished hearing, dysthymic disorder, GERD, lumbar radiculopathy who presents for 6 month follow-up. Per chart review he has a history of PVCs managed with beta blocker with some limitation in the past due to hypotension. Event monitor 2017 had shown 16,700 PVCs in 48 hours, some couplets, infrequent PACs. Last ischemic eval in 2018 showed native CAD with patent grafts, dyspnea not felt related to CAD. Carotid duplex 07/2019 1-39% BICA. 2D echo 07/2019 EF 60-65%, grade 2 DD, mildly enlarged RV with mildly elevated PASP, mild LAE. He also previously followed with a physician in [place] for autonomic dysfunction. He also remotely saw [name] for this. At one point he was on midodrine, Mestinon and Northera but more recently has been on midodrine.  He returns for follow-up today with his wife. In general he feels he is doing OK. He has not had any issues with low blood pressure recently (is on midodrine 10mg  TID and lisinopril 5mg  daily per our discussion). He denies any recent chest pain. His salient complaint is that he struggles with chronic dyspnea on exertion even with simple activities wearing him out like taking a shower. He feels fatigued for the rest of the day after doing this. This is the symptom that prompted cath back in 2018, progressing since that time. In general he is very sedentary due to his other health issues and this is very frustrating to him. He states he is realizing his age does not  allow him to do what he previously could do when he was in his 54s. He walks with a walker apparatus that has arm handles as well.   Labwork independently reviewed: 09/2019 KPN LDL 47, Tchol 116, trig 185 07/2019 K 4.2, Cr 0.6, TSH wnl, A1C 6.4, Hgb 14.8, plt 149   Past Medical History:  Diagnosis Date  . Allergic rhinitis 09/20/2014  . Anemia, unspecified   . Arthritis   . Atrial flutter ([place])    a. s/p RFCA remotely.  . Autonomic dysfunction   . Bladder neck obstruction   . Cataracts, bilateral   . Coronary atherosclerosis of unspecified type of vessel, native or graft    CABG 2002  . Diminished hearing, bilateral   . Dysthymic disorder   . GERD (gastroesophageal reflux disease)   . Heart murmur    as a child  . Impaired glucose tolerance 12/02/2012  . Impotence of organic origin   . Lumbar radiculopathy   . Malignant melanoma of skin of upper limb, including shoulder ([place])   . Old myocardial infarction   . Personal history of malignant melanoma of skin   . Personal history of other diseases of digestive system   . PVC's (premature ventricular contractions)   . Thoracic or lumbosacral neuritis or radiculitis, unspecified   . Unspecified essential hypertension     Past Surgical History:  Procedure Laterality Date  . ARTHRODESIS  07/06/2002   of left long finger distal interphalangeal joint with Kirschner wire fixation X 3  . ATRIAL  ABLATION SURGERY  05/04/2001   Dr. [name]  . CARDIAC CATHETERIZATION  09/05/2008   Revealing 4 of 4 patent grafts with native multivessel coronary artery disease, EF  of 60% without regional wall motion abnormalities.  Marland Kitchen CATARACT EXTRACTION, BILATERAL    . CHOLECYSTECTOMY    . COLONOSCOPY    . CORONARY ANGIOPLASTY  1993  . CORONARY ARTERY BYPASS GRAFT  10/17/2000   [name]. [name], [place]     AFTER LIVER TRAUMA AND HAND SURGERY  . EYE SURGERY    . HERNIA REPAIR     BILATERAL  . LEFT HEART CATH AND  CORS/GRAFTS ANGIOGRAPHY N/A 04/03/2017   Procedure: LEFT HEART CATH AND CORS/GRAFTS ANGIOGRAPHY;  Surgeon: Leonie Man, MD;  Location: [facility];  Service: Cardiovascular;  Laterality: N/A;  . LUMBAR LAMINECTOMY/DECOMPRESSION MICRODISCECTOMY N/A 02/02/2016   Procedure: MICRODISCECTOMY LUMBAR FOUR- LUMBAR FIVE;  Surgeon: Ashok Pall, MD;  Location: [facility];  Service: Neurosurgery;  Laterality: N/A;  MICRODISCECTOMY L4-L5  . LUMBAR LAMINECTOMY/DECOMPRESSION MICRODISCECTOMY Right 08/02/2016   Procedure: MICRODISCECTOMY LUMBAR FOUR- LUMBAR FIVE RIGHT;  Surgeon: Ashok Pall, MD;  Location: [facility];  Service: Neurosurgery;  Laterality: Right;  . punctured eardrum      to relieve blood build up   for ear infection  . TONSILLECTOMY      Current Medications: Current Meds  Medication Sig  . aspirin EC 81 MG tablet Take 81 mg by mouth daily after supper.   . finasteride (PROSCAR) 5 MG tablet Take 1 tablet (5 mg total) by mouth daily after supper.  . gabapentin (NEURONTIN) 300 MG capsule Take 1 capsule (300 mg total) by mouth daily after supper.  . hydrALAZINE (APRESOLINE) 25 MG tablet Take 25 mg by mouth 3 times/day as needed-between meals & bedtime (high bp).  . midodrine (PROAMATINE) 10 MG tablet TAKE 1 TABLET BY MOUTH THREE TIMES A DAY  . nitroGLYCERIN (NITROSTAT) 0.4 MG SL tablet Place 1 tablet (0.4 mg total) under the tongue every 5 (five) minutes as needed for chest pain (x 3 doses). Reported on 06/15/2015  . NON FORMULARY DIET :Regular NAS  . ondansetron (ZOFRAN ODT) 4 MG disintegrating tablet Take 1 tablet (4 mg total) by mouth every 8 (eight) hours as needed for nausea or vomiting.  . rosuvastatin (CRESTOR) 10 MG tablet Take 1 tablet (10 mg total) by mouth daily.  . sertraline (ZOLOFT) 100 MG tablet Take 1 tablet (100 mg total) by mouth daily.  . tamsulosin (FLOMAX) 0.4 MG CAPS capsule Take 1 capsule (0.4 mg total) by mouth daily after supper.  Marland Kitchen tiZANidine (ZANAFLEX) 2 MG tablet Take 1  tablet (2 mg total) by mouth See admin instructions. Take one tablet (2 mg) by mouth every morning, take one tablet (2 mg) later in the day as needed for pain  . traMADol (ULTRAM) 50 MG tablet Take 0.5 tablets (25 mg total) by mouth at bedtime as needed. For 14 days for neck pain  . [DISCONTINUED] amoxicillin (AMOXIL) 500 MG tablet Take 500 mg by mouth 3 (three) times daily. No longer taking  . lisinopril (ZESTRIL) 5 MG tablet Take 1 tablet (5 mg total) by mouth daily.  . [DISCONTINUED] meclizine (ANTIVERT) 12.5 MG tablet Take 1 tablet (12.5 mg total) by mouth 3 (three) times daily as needed for dizziness. No longer taking      Allergies:   Iohexol, Ciprofloxacin, and Other   Social History   Socioeconomic History  . Marital status: Married  Spouse name: Not on file  . Number of children: 5  . Years of education: Not on file  . Highest education level: Not on file  Occupational History  . Occupation: retired  Tobacco Use  . Smoking status: Former Smoker    Packs/day: 1.00    Years: 3.00    Pack years: 3.00    Types: Cigarettes    Quit date: 09/21/1955    Years since quitting: 64.4  . Smokeless tobacco: Never Used  Vaping Use  . Vaping Use: Never used  Substance and Sexual Activity  . Alcohol use: Yes    Alcohol/week: 0.0 standard drinks    Comment: one or two ounces a week  . Drug use: No  . Sexual activity: Not on file  Other Topics Concern  . Not on file  Social History Narrative   Lives with wife in a one story home.     Has 5 children.  (2 are deceased now) Retired Freight forwarder for [employer].     Education: 2 years of college.    Social Determinants of Health   Financial Resource Strain:   . Difficulty of Paying Living Expenses: Not on file  Food Insecurity:   . Worried About Charity fundraiser in the Last Year: Not on file  . Ran Out of Food in the Last Year: Not on file  Transportation Needs:   . Lack of Transportation (Medical): Not on file  . Lack of  Transportation (Non-Medical): Not on file  Physical Activity:   . Days of Exercise per Week: Not on file  . Minutes of Exercise per Session: Not on file  Stress:   . Feeling of Stress : Not on file  Social Connections:   . Frequency of Communication with Friends and Family: Not on file  . Frequency of Social Gatherings with Friends and Family: Not on file  . Attends Religious Services: Not on file  . Active Member of Clubs or Organizations: Not on file  . Attends Archivist Meetings: Not on file  . Marital Status: Not on file     Family History:  The patient's family history includes Heart disease in his father. There is no history of Ataxia, Chorea, Dementia, Mental retardation, Migraines, Multiple sclerosis, Neurofibromatosis, Neuropathy, Parkinsonism, Seizures, or Stroke.  ROS:   Please see the history of present illness. Otherwise, review of systems is negative for bleeding or weight changes.  All other systems are reviewed and otherwise negative.    EKGs/Labs/Other Studies Reviewed:    Studies reviewed are outlined and summarized above. Reports included below if pertinent.  2D echo 07/2019  1. Left ventricular ejection fraction, by estimation, is 60 to 65%. The  left ventricle has normal function. The left ventricle has no regional  wall motion abnormalities. There is mild left ventricular hypertrophy.  Left ventricular diastolic parameters  are consistent with Grade II diastolic dysfunction (pseudonormalization).  Elevated left ventricular end-diastolic pressure.  2. Right ventricular systolic function is normal. The right ventricular  size is mildly enlarged. There is mildly elevated pulmonary artery  systolic pressure.  3. Left atrial size was mildly dilated.  4. The mitral valve is degenerative. Trivial mitral valve regurgitation.  No evidence of mitral stenosis.  5. The aortic valve is tricuspid. Aortic valve regurgitation is trivial.  No aortic  stenosis is present.  6. The inferior vena cava is normal in size with greater than 50%  respiratory variability, suggesting right atrial pressure of 3 mmHg.  [place] 2018  1. Left ventricular ejection fraction, by estimation, is 60 to 65%. The  left ventricle has normal function. The left ventricle has no regional  wall motion abnormalities. There is mild left ventricular hypertrophy.  Left ventricular diastolic parameters  are consistent with Grade II diastolic dysfunction (pseudonormalization).  Elevated left ventricular end-diastolic pressure.  2. Right ventricular systolic function is normal. The right ventricular  size is mildly enlarged. There is mildly elevated pulmonary artery  systolic pressure.  3. Left atrial size was mildly dilated.  4. The mitral valve is degenerative. Trivial mitral valve regurgitation.  No evidence of mitral stenosis.  5. The aortic valve is tricuspid. Aortic valve regurgitation is trivial.  No aortic stenosis is present.  6. The inferior vena cava is normal in size with greater than 50%  respiratory variability, suggesting right atrial pressure of 3 mmHg.     EKG:  EKG is ordered today, personally reviewed, demonstrating NSR 60bpm, subtle NSST changes with more flattened appearance in avL otherwise similar to prior  Recent Labs: 08/13/2019: Magnesium 2.0; TSH 2.884 08/15/2019: ALT 23 08/19/2019: BUN 14; Creatinine 0.6; Hemoglobin 14.8; Platelets 149; Potassium 4.2; Sodium 141  Recent Lipid Panel    Component Value Date/Time   CHOL 110 01/24/2016 0955   TRIG 133.0 01/24/2016 0955   TRIG 83 03/18/2006 0839   HDL 49.70 01/24/2016 0955   CHOLHDL 2 01/24/2016 0955   VLDL 26.6 01/24/2016 0955   LDLCALC 34 01/24/2016 0955    PHYSICAL EXAM:    VS:  BP 140/72   Pulse 60   Ht 5\' 9"  (1.753 m)   Wt 177 lb 9.6 oz (80.6 kg)   SpO2 95%   BMI 26.23 kg/m   BMI: Body mass index is 26.23 kg/m.  GEN: Well nourished, well developed elderly WM, in  no acute distress, appears younger than stated age 64: normocephalic, atraumatic Neck: no JVD, carotid bruits, or masses Cardiac: RRR; no murmurs, rubs, or gallops, no edema  Respiratory:  clear to auscultation bilaterally, normal work of breathing GI: soft, nontender, nondistended, + BS MS: no deformity or atrophy Skin: warm and dry, no rash Neuro:  Alert and Oriented x 3, Strength and sensation are intact, follows commands Psych: euthymic mood, full affect  Wt Readings from Last 3 Encounters:  02/22/20 177 lb 9.6 oz (80.6 kg)  10/19/19 174 lb 6.4 oz (79.1 kg)  09/20/19 173 lb (78.5 kg)     ASSESSMENT & PLAN:   1. Shortness of breath - suspect this is multifactorial and especially exacerbated by deconditioning. He is not able to be very active at home. His cath in 2018 was unrevealing for a cause and 2D echo showed findings above with normal LVEF. With RV dilation it is possible there could be a pulmonary cause or completely other etiology best followed in primary care. 2019 CXR showed mild atelectasis/scarring. We did discuss how aggressive he wanted to be with his cardiac workup - based on cardiac testing fairly recently for this issue it seems low yield to undergo invasive evaluations and he agrees with a more conservative approach. This seems reasonable given his comorbidities and functional status. We discussed trial of medication to see if we could improve his dyspnea and he'd like to try. Will switch lisinopril to Imdur 30mg  in case of microvascular angina and see how he does. Asked him to keep a log of BPs and let us know how he's doing in a week on this change. Of note, pulse ox  is normal today in clinic with ambulation. Will also check CBC, BMET, BNP today. Lungs are clear and he does not appear overtly volume overloaded. 2. CAD s/p prior CABG - continue ASA, statin. Avoid beta blocker with resting bradycardia. 3. Frequent PVCs - quiescent on both exam and EKG today. Continue to  follow conservatively. LVEF normal earlier this year. His dyspnea is primarily exertional which does not necessarily correlate with this finding. 4. Remote atrial flutter - quiescent. Maintaining NSR.  5. Orthostatic hypotension - fortunately a non-issue recently. It is a little odd that he is on both lisinopril and midodrine together but he has been tolerating this well. He no longer has to follow in [place] for this. We'll see how he does switching from lisinopril to Imdur. If he has significantly elevated BPs at home with the change, could consider reducing dose of midodrine although I am not eager to make too many changes at once. He also clarifies he has a PRN hydralazine dose he takes if his BP spikes.  Disposition: F/u with Dr. [name] in 3-4 months  Medication Adjustments/Labs and Tests Ordered: Current medicines are reviewed at length with the patient today.  Concerns regarding medicines are outlined above. Medication changes, Labs and Tests ordered today are summarized above and listed in the Patient Instructions accessible in Encounters.   Signed, Charlie Pitter, PA-C  02/22/2020 12:42 PM    [facility] [address], [place], [place]  [address] Phone: [phone]; Fax: [phone]

## 2020-02-22 ENCOUNTER — Ambulatory Visit (INDEPENDENT_AMBULATORY_CARE_PROVIDER_SITE_OTHER): Payer: Medicare Other | Admitting: Physician Assistant

## 2020-02-22 ENCOUNTER — Other Ambulatory Visit: Payer: Self-pay

## 2020-02-22 ENCOUNTER — Encounter: Payer: Self-pay | Admitting: Physician Assistant

## 2020-02-22 VITALS — BP 140/72 | HR 60 | Ht 69.0 in | Wt 177.6 lb

## 2020-02-22 DIAGNOSIS — I951 Orthostatic hypotension: Secondary | ICD-10-CM | POA: Diagnosis not present

## 2020-02-22 DIAGNOSIS — I4892 Unspecified atrial flutter: Secondary | ICD-10-CM | POA: Diagnosis not present

## 2020-02-22 DIAGNOSIS — R0602 Shortness of breath: Secondary | ICD-10-CM | POA: Diagnosis not present

## 2020-02-22 DIAGNOSIS — I493 Ventricular premature depolarization: Secondary | ICD-10-CM

## 2020-02-22 DIAGNOSIS — I2510 Atherosclerotic heart disease of native coronary artery without angina pectoris: Secondary | ICD-10-CM | POA: Diagnosis not present

## 2020-02-22 MED ORDER — ISOSORBIDE MONONITRATE ER 30 MG PO TB24: 30.0000 mg | ORAL_TABLET | Freq: Every day | ORAL | 3 refills | Status: DC

## 2020-02-22 NOTE — Patient Instructions (Addendum)
Medication Instructions:  Your physician has recommended you make the following change in your medication:  1.  STOP the Lisinopril 2.  START Imdur 30 mg taking 1 tablet daily  .  *If you need a refill on your cardiac medications before your next appointment, please call your pharmacy*   Lab Work: TODAY:  BMET, CBC, & PRO BNP  If you have labs (blood work) drawn today and your tests are completely normal, you will receive your results only by: Marland Kitchen MyChart Message (if you have MyChart) OR . A paper copy in the mail If you have any lab test that is abnormal or we need to change your treatment, we will call you to review the results.   Testing/Procedures: None ordered   Follow-Up: At [facility], you and your health needs are our priority.  As part of our continuing mission to provide you with exceptional heart care, we have created designated Provider Care Teams.  These Care Teams include your primary Cardiologist (physician) and Advanced Practice Providers (APPs -  Physician Assistants and Nurse Practitioners) who all work together to provide you with the care you need, when you need it.  We recommend signing up for the patient portal called "MyChart".  Sign up information is provided on this After Visit Summary.  MyChart is used to connect with patients for Virtual Visits (Telemedicine).  Patients are able to view lab/test results, encounter notes, upcoming appointments, etc.  Non-urgent messages can be sent to your provider as well.   To learn more about what you can do with MyChart, go to [email or web address].    Your next appointment:   3-4 month(s)  The format for your next appointment:   In Person  Provider:   You may see Ena Dawley, MD or one of the following Advanced Practice Providers on your designated Care Team:    Melina Copa, PA-C  Ermalinda Barrios, PA-C    Other Instructions

## 2020-02-23 ENCOUNTER — Telehealth: Payer: Self-pay | Admitting: *Deleted

## 2020-02-23 DIAGNOSIS — Z79899 Other long term (current) drug therapy: Secondary | ICD-10-CM

## 2020-02-23 LAB — BASIC METABOLIC PANEL
BUN/Creatinine Ratio: 24 (ref 10–24)
BUN: 23 mg/dL (ref 8–27)
CO2: 25 mmol/L (ref 20–29)
Calcium: 10 mg/dL (ref 8.6–10.2)
Chloride: 107 mmol/L — ABNORMAL HIGH (ref 96–106)
Creatinine, Ser: 0.97 mg/dL (ref 0.76–1.27)
GFR calc Af Amer: 81 mL/min/{1.73_m2} (ref >59)
GFR calc non Af Amer: 70 mL/min/{1.73_m2} (ref >59)
Glucose: 177 mg/dL — ABNORMAL HIGH (ref 65–99)
Potassium: 5.3 mmol/L — ABNORMAL HIGH (ref 3.5–5.2)
Sodium: 143 mmol/L (ref 134–144)

## 2020-02-23 LAB — CBC
Hematocrit: 44 % (ref 37.5–51.0)
Hemoglobin: 14.7 g/dL (ref 13.0–17.7)
MCH: 29.2 pg (ref 26.6–33.0)
MCHC: 33.4 g/dL (ref 31.5–35.7)
MCV: 87 fL (ref 79–97)
Platelets: 232 10*3/uL (ref 150–450)
RBC: 5.04 x10E6/uL (ref 4.14–5.80)
RDW: 13.4 % (ref 11.6–15.4)
WBC: 12.1 10*3/uL — ABNORMAL HIGH (ref 3.4–10.8)

## 2020-02-23 LAB — PRO B NATRIURETIC PEPTIDE: NT-Pro BNP: 178 pg/mL (ref 0–486)

## 2020-02-23 NOTE — Telephone Encounter (Signed)
-----   Message from Charlie Pitter, [place] sent at 02/23/2020  9:42 AM EDT ----- Please let pt know labs ok except a few abnormalities.  Potassium borderline elevated just 0.1 point above normal. Previously in a normal range. This also supports our need to plan to lisinopril. Make sure not taking any potassium supplements. Would recheck potassium level only (not full BMET) Friday or Monday to trend. Some things to f/u PCP - Blood sugar elevated (previous A1C consistent with borderline diabetes) and mildly elevated WBC which always appears at the upper limits of normal. Fluid marker is totally normal which is good. Continue plan as discussed.

## 2020-02-25 ENCOUNTER — Other Ambulatory Visit: Payer: Medicare Other

## 2020-04-05 ENCOUNTER — Telehealth: Payer: Self-pay

## 2020-04-05 NOTE — Telephone Encounter (Signed)
Attempted to contact patient's care giver Altha Harm to schedule a Palliative care consult. Left a voicemail to return call.

## 2020-04-06 ENCOUNTER — Telehealth: Payer: Self-pay

## 2020-04-06 NOTE — Telephone Encounter (Signed)
Spoke with patient's care giver Troy Combs and scheduled an in-person Palliative Consult for 04/11/20 @ 3PM   COVID screening was negative. No pets in home. Patient lives with wife Troy Combs    Consent obtained; updated Outlook/Netsmart/Team List and Epic.

## 2020-04-11 ENCOUNTER — Other Ambulatory Visit: Payer: Medicare Other | Admitting: Nurse Practitioner

## 2020-04-19 ENCOUNTER — Ambulatory Visit: Payer: Medicare Other | Admitting: Neurology

## 2020-04-19 ENCOUNTER — Encounter: Payer: Self-pay | Admitting: Neurology

## 2020-04-19 DIAGNOSIS — Z029 Encounter for administrative examinations, unspecified: Secondary | ICD-10-CM

## 2020-04-20 ENCOUNTER — Ambulatory Visit: Payer: Medicare Other | Admitting: Neurology

## 2020-04-24 ENCOUNTER — Other Ambulatory Visit: Payer: Self-pay | Admitting: Neurosurgery

## 2020-04-24 ENCOUNTER — Telehealth: Payer: Self-pay | Admitting: *Deleted

## 2020-04-24 NOTE — Telephone Encounter (Signed)
   [facility] Pre-operative Risk Assessment    HEARTCARE STAFF: - Please ensure there is not already an duplicate clearance open for this procedure. - Under Visit Info/Reason for Call, type in Other and utilize the format Clearance MM/DD/YY or Clearance TBD. Do not use dashes or single digits. - If request is for dental extraction, please clarify the # of teeth to be extracted.  Request for surgical clearance:  1. What type of surgery is being performed? L4-5 Posterior lumbar interbody fusion   2. When is this surgery scheduled? 05/24/2020   3. What type of clearance is required (medical clearance vs. Pharmacy clearance to hold med vs. Both)? Medical  4. Are there any medications that need to be held prior to surgery and how long?Asa 81    5. Practice name and name of physician performing surgery? [facility], Dr Ashok Pall   6. What is the office phone number? [phone]   7.   What is the office fax number? [phone]  8.   Anesthesia type (None, local, MAC, general) ? General   Troy Combs L 04/24/2020, 5:05 PM  _________________________________________________________________   (provider comments below)

## 2020-04-25 NOTE — Telephone Encounter (Signed)
   Primary Cardiologist: Tobias Alexander, MD  Chart reviewed as part of pre-operative protocol coverage. [name] was last seen 02/22/20 by Ronie Spies, PA. He has upcoming follow up 05/05/19 in our office. As such, cardiac clearance will be addressed at office visit 05/05/19 as procedure date is not until 05/25/19. I have updated the appointment notes to say 'preop' and will CC Ronie Spies, PA as an [place].   I will route this recommendation to the requesting party via Epic fax function and remove from pre-op pool.  Please call with questions.  Alver Sorrow, NP 04/25/2020, 9:13 AM

## 2020-04-26 ENCOUNTER — Observation Stay (HOSPITAL_BASED_OUTPATIENT_CLINIC_OR_DEPARTMENT_OTHER)
Admission: EM | Admit: 2020-04-26 | Discharge: 2020-04-28 | Disposition: A | Discharge status: Home / Self Care | Payer: Medicare Other | Attending: Internal Medicine | Admitting: Internal Medicine

## 2020-04-26 ENCOUNTER — Emergency Department (HOSPITAL_BASED_OUTPATIENT_CLINIC_OR_DEPARTMENT_OTHER): Payer: Medicare Other

## 2020-04-26 ENCOUNTER — Other Ambulatory Visit: Payer: Self-pay

## 2020-04-26 ENCOUNTER — Encounter (HOSPITAL_BASED_OUTPATIENT_CLINIC_OR_DEPARTMENT_OTHER): Payer: Self-pay | Admitting: Emergency Medicine

## 2020-04-26 DIAGNOSIS — Z951 Presence of aortocoronary bypass graft: Secondary | ICD-10-CM | POA: Insufficient documentation

## 2020-04-26 DIAGNOSIS — I10 Essential (primary) hypertension: Secondary | ICD-10-CM | POA: Diagnosis not present

## 2020-04-26 DIAGNOSIS — I6782 Cerebral ischemia: Secondary | ICD-10-CM | POA: Diagnosis not present

## 2020-04-26 DIAGNOSIS — W1839XA Other fall on same level, initial encounter: Secondary | ICD-10-CM | POA: Insufficient documentation

## 2020-04-26 DIAGNOSIS — I700 Atherosclerosis of aorta: Secondary | ICD-10-CM | POA: Insufficient documentation

## 2020-04-26 DIAGNOSIS — J479 Bronchiectasis, uncomplicated: Secondary | ICD-10-CM | POA: Insufficient documentation

## 2020-04-26 DIAGNOSIS — Z20822 Contact with and (suspected) exposure to covid-19: Secondary | ICD-10-CM | POA: Insufficient documentation

## 2020-04-26 DIAGNOSIS — S2242XA Multiple fractures of ribs, left side, initial encounter for closed fracture: Principal | ICD-10-CM

## 2020-04-26 DIAGNOSIS — Z7982 Long term (current) use of aspirin: Secondary | ICD-10-CM | POA: Diagnosis not present

## 2020-04-26 DIAGNOSIS — Z87891 Personal history of nicotine dependence: Secondary | ICD-10-CM | POA: Insufficient documentation

## 2020-04-26 DIAGNOSIS — Z79899 Other long term (current) drug therapy: Secondary | ICD-10-CM | POA: Insufficient documentation

## 2020-04-26 DIAGNOSIS — S2249XA Multiple fractures of ribs, unspecified side, initial encounter for closed fracture: Secondary | ICD-10-CM | POA: Insufficient documentation

## 2020-04-26 DIAGNOSIS — R52 Pain, unspecified: Secondary | ICD-10-CM

## 2020-04-26 DIAGNOSIS — W19XXXA Unspecified fall, initial encounter: Secondary | ICD-10-CM

## 2020-04-26 DIAGNOSIS — J9601 Acute respiratory failure with hypoxia: Secondary | ICD-10-CM | POA: Insufficient documentation

## 2020-04-26 DIAGNOSIS — G8929 Other chronic pain: Secondary | ICD-10-CM | POA: Insufficient documentation

## 2020-04-26 DIAGNOSIS — R262 Difficulty in walking, not elsewhere classified: Secondary | ICD-10-CM | POA: Insufficient documentation

## 2020-04-26 DIAGNOSIS — I2510 Atherosclerotic heart disease of native coronary artery without angina pectoris: Secondary | ICD-10-CM | POA: Insufficient documentation

## 2020-04-26 DIAGNOSIS — K219 Gastro-esophageal reflux disease without esophagitis: Secondary | ICD-10-CM | POA: Diagnosis not present

## 2020-04-26 DIAGNOSIS — M549 Dorsalgia, unspecified: Secondary | ICD-10-CM | POA: Insufficient documentation

## 2020-04-26 DIAGNOSIS — N281 Cyst of kidney, acquired: Secondary | ICD-10-CM | POA: Diagnosis not present

## 2020-04-26 DIAGNOSIS — S299XXA Unspecified injury of thorax, initial encounter: Secondary | ICD-10-CM | POA: Diagnosis present

## 2020-04-26 LAB — CBC WITH DIFFERENTIAL/PLATELET
Abs Immature Granulocytes: 0.35 10*3/uL — ABNORMAL HIGH (ref 0.00–0.07)
Basophils Absolute: 0.1 10*3/uL (ref 0.0–0.1)
Basophils Relative: 0 %
Eosinophils Absolute: 0 10*3/uL (ref 0.0–0.5)
Eosinophils Relative: 0 %
HCT: 43 % (ref 39.0–52.0)
Hemoglobin: 14.9 g/dL (ref 13.0–17.0)
Immature Granulocytes: 3 %
Lymphocytes Relative: 7 %
Lymphs Abs: 1 10*3/uL (ref 0.7–4.0)
MCH: 30 pg (ref 26.0–34.0)
MCHC: 34.7 g/dL (ref 30.0–36.0)
MCV: 86.7 fL (ref 80.0–100.0)
Monocytes Absolute: 0.9 10*3/uL (ref 0.1–1.0)
Monocytes Relative: 7 %
Neutro Abs: 11.8 10*3/uL — ABNORMAL HIGH (ref 1.7–7.7)
Neutrophils Relative %: 83 %
Platelets: 167 10*3/uL (ref 150–400)
RBC: 4.96 MIL/uL (ref 4.22–5.81)
RDW: 13.7 % (ref 11.5–15.5)
WBC: 14.2 10*3/uL — ABNORMAL HIGH (ref 4.0–10.5)
nRBC: 0 % (ref 0.0–0.2)

## 2020-04-26 LAB — BASIC METABOLIC PANEL
Anion gap: 11 (ref 5–15)
BUN: 23 mg/dL (ref 8–23)
CO2: 19 mmol/L — ABNORMAL LOW (ref 22–32)
Calcium: 9 mg/dL (ref 8.9–10.3)
Chloride: 105 mmol/L (ref 98–111)
Creatinine, Ser: 0.75 mg/dL (ref 0.61–1.24)
GFR, Estimated: >60 mL/min (ref >60)
Glucose, Bld: 167 mg/dL — ABNORMAL HIGH (ref 70–99)
Potassium: 4 mmol/L (ref 3.5–5.1)
Sodium: 135 mmol/L (ref 135–145)

## 2020-04-26 LAB — TROPONIN I (HIGH SENSITIVITY): Troponin I (High Sensitivity): 8 ng/L (ref <18)

## 2020-04-26 MED ORDER — FENTANYL CITRATE (PF) 100 MCG/2ML IJ SOLN
25.0000 ug | Freq: Once | INTRAMUSCULAR | Status: AC
  Administered 2020-04-26: 25 ug via INTRAVENOUS
  Filled 2020-04-26: qty 2

## 2020-04-26 MED ORDER — FENTANYL CITRATE (PF) 100 MCG/2ML IJ SOLN
25.0000 ug | Freq: Once | INTRAMUSCULAR | Status: AC
  Filled 2020-04-26: qty 2

## 2020-04-26 MED ORDER — ONDANSETRON HCL 4 MG/2ML IJ SOLN
4.0000 mg | Freq: Once | INTRAMUSCULAR | Status: AC
  Filled 2020-04-26: qty 2

## 2020-04-26 MED ORDER — ONDANSETRON HCL 4 MG/2ML IJ SOLN
4.0000 mg | Freq: Four times a day (QID) | INTRAMUSCULAR | Status: DC | PRN
  Filled 2020-04-26: qty 2

## 2020-04-26 MED ORDER — FENTANYL CITRATE (PF) 100 MCG/2ML IJ SOLN
INTRAMUSCULAR | Status: AC
  Administered 2020-04-26: 25 ug via INTRAVENOUS
  Filled 2020-04-26: qty 2

## 2020-04-26 MED ORDER — ONDANSETRON HCL 4 MG/2ML IJ SOLN
INTRAMUSCULAR | Status: AC
  Administered 2020-04-26: 4 mg via INTRAVENOUS
  Filled 2020-04-26: qty 2

## 2020-04-26 MED ORDER — TRAMADOL HCL 50 MG PO TABS
50.0000 mg | ORAL_TABLET | Freq: Four times a day (QID) | ORAL | Status: DC | PRN
  Administered 2020-04-27: 50 mg via ORAL
  Filled 2020-04-26 (×2): qty 1

## 2020-04-26 MED ORDER — FENTANYL CITRATE (PF) 100 MCG/2ML IJ SOLN
25.0000 ug | INTRAMUSCULAR | Status: DC | PRN
  Administered 2020-04-27: 25 ug via INTRAVENOUS
  Filled 2020-04-26: qty 2

## 2020-04-26 MED ORDER — OXYCODONE-ACETAMINOPHEN 5-325 MG PO TABS
ORAL_TABLET | ORAL | Status: AC
  Filled 2020-04-26: qty 1

## 2020-04-26 MED ORDER — ACETAMINOPHEN 325 MG PO TABS
650.0000 mg | ORAL_TABLET | Freq: Four times a day (QID) | ORAL | Status: DC
  Administered 2020-04-27 – 2020-04-28 (×5): 650 mg via ORAL
  Filled 2020-04-26 (×7): qty 2

## 2020-04-26 MED ORDER — OXYCODONE-ACETAMINOPHEN 5-325 MG PO TABS
1.0000 | ORAL_TABLET | ORAL | Status: DC | PRN
  Administered 2020-04-26: 1 via ORAL

## 2020-04-26 NOTE — ED Notes (Signed)
ED Provider at bedside. 

## 2020-04-26 NOTE — ED Triage Notes (Signed)
Pt fell last night hitting left side c/o difficulty taking a deep breath and pain on that side , pt [id number] pt arrives via ems

## 2020-04-26 NOTE — ED Provider Notes (Signed)
[facility] EMERGENCY DEPARTMENT Provider Note   CSN: [id number] Arrival date & time: 04/26/20  1324     History Chief Complaint  Patient presents with  . Rib Injury  . Fall    Troy Combs is a 86 y.o. male.  Patient with a history of hypertension, chronic back pain, GERD, previous CABG, previous liver laceration here with left-sided rib pain after stumbling last night and hitting the side of a recliner.  States he normally walks with a walker and somehow lost his balance and hit his left side on an upolstered arm of chair last night.  He has severe pain with breathing and movement.  He was given oxycodone in triage which made him vomit.  He called EMS today with severe pain and shortness of breath.  He denies hitting his head or losing consciousness.  Denies any neck pain.  His back pain is chronic and unchanged.  Denies any blood thinner use.  Denies any pain in her arms or legs.  Denies any history of asthma or COPD.  The history is provided by the patient and the spouse.  Fall Associated symptoms include shortness of breath. Pertinent negatives include no chest pain, no abdominal pain and no headaches.       Past Medical History:  Diagnosis Date  . Allergic rhinitis 09/20/2014  . Anemia, unspecified   . Arthritis   . Atrial flutter ([place])    a. s/p RFCA remotely.  . Autonomic dysfunction   . Bladder neck obstruction   . Cataracts, bilateral   . Coronary atherosclerosis of unspecified type of vessel, native or graft    CABG 2002  . Diminished hearing, bilateral   . Dysthymic disorder   . GERD (gastroesophageal reflux disease)   . Heart murmur    as a child  . Impaired glucose tolerance 12/02/2012  . Impotence of organic origin   . Lumbar radiculopathy   . Malignant melanoma of skin of upper limb, including shoulder ([place])   . Old myocardial infarction   . Personal history of malignant melanoma of skin   . Personal history of other diseases of digestive  system   . PVC's (premature ventricular contractions)   . Thoracic or lumbosacral neuritis or radiculitis, unspecified   . Unspecified essential hypertension     Patient Active Problem List   Diagnosis Date Noted  . Postural dizziness with presyncope 08/13/2019  . Hypertensive urgency 08/12/2019  . Impacted cerumen of both ears 08/12/2019  . Personal history of malignant melanoma of skin   . Lumbar radiculopathy   . Heart murmur   . GERD (gastroesophageal reflux disease)   . Diminished hearing, bilateral   . Cataracts, bilateral   . Bladder neck obstruction   . Arthritis   . Constipation 12/12/2016  . Nausea 12/12/2016  . Coronary artery disease involving native coronary artery of native heart without angina pectoris 11/22/2016  . Dyslipidemia 11/22/2016  . History of coronary artery bypass surgery 11/22/2016  . Depression with anxiety   . Orthostasis 09/20/2016  . HNP (herniated nucleus pulposus), lumbar 02/02/2016  . Spinal stenosis of lumbar region 01/24/2016  . Sciatica, right side 01/24/2016  . Prediabetes 01/24/2016  . Otitis, externa, infective 01/24/2016  . Left lumbar radiculopathy 01/15/2016  . PVC's (premature ventricular contractions) 05/24/2015  . Peripheral polyneuropathy 03/06/2015  . BPPV (benign paroxysmal positional vertigo), right 03/06/2015  . Headache 01/18/2015  . Vertigo 01/18/2015  . Allergic rhinitis 09/20/2014  . Dyspnea on exertion 08/10/2014  .  Severe dizziness 08/10/2014  . Diarrhea 05/18/2014  . LLQ pain 05/13/2014  . Chronic meniscal tear of knee 09/22/2013  . Primary localized osteoarthrosis, lower leg 09/22/2013  . Neck pain, bilateral 12/18/2012  . Headache(784.0) 12/18/2012  . Binocular visual disturbance 12/18/2012  . Epiretinal membrane 12/17/2012  . Status post intraocular lens implant 12/17/2012  . Preventative health care 12/02/2012  . Low back pain 12/02/2012  . Impaired glucose tolerance 12/02/2012  . Palpitations  10/09/2010  . B12 deficiency 08/30/2010  . Hearing loss 08/30/2010  . Tinnitus 08/30/2010  . Neurogenic orthostatic hypotension ([place]) 06/28/2009  . Mixed hyperlipidemia 05/15/2009  . CHEST PAIN UNSPECIFIED 11/23/2008  . Hypotension, unspecified 10/12/2008  . DIZZINESS 10/12/2008  . Shortness of breath 09/14/2008  . UNSPECIFIED ANEMIA 09/13/2008  . ANXIETY DEPRESSION 09/13/2008  . Old myocardial infarction 09/13/2008  . GASTROESOPHAGEAL REFLUX DISEASE, HX OF 09/13/2008  . LUMBAR RADICULOPATHY, LEFT 08/23/2008  . MELANOMA, SHOULDER 11/27/2006  . Essential hypertension 11/27/2006  . Coronary atherosclerosis 11/27/2006  . BPH associated with nocturia 11/27/2006  . ERECTILE DYSFUNCTION, ORGANIC 11/27/2006    Past Surgical History:  Procedure Laterality Date  . ARTHRODESIS  07/06/2002   of left long finger distal interphalangeal joint with Kirschner wire fixation X 3  . ATRIAL ABLATION SURGERY  05/04/2001   Dr. [name]  . CARDIAC CATHETERIZATION  09/05/2008   Revealing 4 of 4 patent grafts with native multivessel coronary artery disease, EF  of 60% without regional wall motion abnormalities.  Marland Kitchen CATARACT EXTRACTION, BILATERAL    . CHOLECYSTECTOMY    . COLONOSCOPY    . CORONARY ANGIOPLASTY  1993  . CORONARY ARTERY BYPASS GRAFT  10/17/2000   [name]. [name], [place]     AFTER LIVER TRAUMA AND HAND SURGERY  . EYE SURGERY    . HERNIA REPAIR     BILATERAL  . LEFT HEART CATH AND CORS/GRAFTS ANGIOGRAPHY N/A 04/03/2017   Procedure: LEFT HEART CATH AND CORS/GRAFTS ANGIOGRAPHY;  Surgeon: Leonie Man, MD;  Location: [facility];  Service: Cardiovascular;  Laterality: N/A;  . LUMBAR LAMINECTOMY/DECOMPRESSION MICRODISCECTOMY N/A 02/02/2016   Procedure: MICRODISCECTOMY LUMBAR FOUR- LUMBAR FIVE;  Surgeon: Ashok Pall, MD;  Location: [facility];  Service: Neurosurgery;  Laterality: N/A;  MICRODISCECTOMY L4-L5  . LUMBAR LAMINECTOMY/DECOMPRESSION MICRODISCECTOMY  Right 08/02/2016   Procedure: MICRODISCECTOMY LUMBAR FOUR- LUMBAR FIVE RIGHT;  Surgeon: Ashok Pall, MD;  Location: [facility];  Service: Neurosurgery;  Laterality: Right;  . punctured eardrum      to relieve blood build up   for ear infection  . TONSILLECTOMY         Family History  Problem Relation Age of Onset  . Heart disease Father   . Ataxia Neg Hx   . Chorea Neg Hx   . Dementia Neg Hx   . Mental retardation Neg Hx   . Migraines Neg Hx   . Multiple sclerosis Neg Hx   . Neurofibromatosis Neg Hx   . Neuropathy Neg Hx   . Parkinsonism Neg Hx   . Seizures Neg Hx   . Stroke Neg Hx     Social History   Tobacco Use  . Smoking status: Former Smoker    Packs/day: 1.00    Years: 3.00    Pack years: 3.00    Types: Cigarettes    Quit date: 09/21/1955    Years since quitting: 64.6  . Smokeless tobacco: Never Used  Vaping Use  . Vaping Use: Never used  Substance Use Topics  . Alcohol use: Yes    Alcohol/week: 0.0 standard drinks    Comment: one or two ounces a week  . Drug use: No    Home Medications Prior to Admission medications   Medication Sig Start Date End Date Taking? Authorizing Provider  aspirin EC 81 MG tablet Take 81 mg by mouth daily after supper.     [provider]  finasteride (PROSCAR) 5 MG tablet Take 1 tablet (5 mg total) by mouth daily after supper. 09/03/19   Granville Lewis C, PA-C  gabapentin (NEURONTIN) 300 MG capsule Take 1 capsule (300 mg total) by mouth daily after supper. 09/03/19   Granville Lewis C, PA-C  hydrALAZINE (APRESOLINE) 25 MG tablet Take 25 mg by mouth 3 times/day as needed-between meals & bedtime (high bp).    [provider]  isosorbide mononitrate (IMDUR) 30 MG 24 hr tablet Take 1 tablet (30 mg total) by mouth daily. 02/22/20 05/22/20  Dunn, Nedra Hai, PA-C  midodrine (PROAMATINE) 10 MG tablet TAKE 1 TABLET BY MOUTH THREE TIMES A DAY 10/29/19   Dorothy Spark, MD  nitroGLYCERIN (NITROSTAT) 0.4 MG SL tablet Place 1 tablet (0.4 mg  total) under the tongue every 5 (five) minutes as needed for chest pain (x 3 doses). Reported on 06/15/2015 09/03/19   Wille Celeste, PA-C  NON FORMULARY DIET :Regular NAS    [provider]  ondansetron (ZOFRAN ODT) 4 MG disintegrating tablet Take 1 tablet (4 mg total) by mouth every 8 (eight) hours as needed for nausea or vomiting. 09/03/19   Granville Lewis C, PA-C  rosuvastatin (CRESTOR) 10 MG tablet Take 1 tablet (10 mg total) by mouth daily. 09/03/19   Granville Lewis C, PA-C  sertraline (ZOLOFT) 100 MG tablet Take 1 tablet (100 mg total) by mouth daily. 09/03/19   Granville Lewis C, PA-C  tamsulosin (FLOMAX) 0.4 MG CAPS capsule Take 1 capsule (0.4 mg total) by mouth daily after supper. 09/03/19   Granville Lewis C, PA-C  tiZANidine (ZANAFLEX) 2 MG tablet Take 1 tablet (2 mg total) by mouth See admin instructions. Take one tablet (2 mg) by mouth every morning, take one tablet (2 mg) later in the day as needed for pain 09/03/19   Granville Lewis C, PA-C  traMADol (ULTRAM) 50 MG tablet Take 0.5 tablets (25 mg total) by mouth at bedtime as needed. For 14 days for neck pain 09/03/19   Granville Lewis C, PA-C    Allergies    Iohexol, Ciprofloxacin, and Other  Review of Systems   Review of Systems  Constitutional: Negative for activity change, appetite change and fever.  HENT: Negative for congestion and rhinorrhea.   Eyes: Negative for visual disturbance.  Respiratory: Positive for chest tightness and shortness of breath.   Cardiovascular: Negative for chest pain.  Gastrointestinal: Negative for abdominal pain, nausea and vomiting.  Genitourinary: Negative for dysuria and hematuria.  Musculoskeletal: Positive for arthralgias, back pain and myalgias. Negative for neck pain.  Skin: Negative for rash.  Neurological: Negative for dizziness, weakness and headaches.   all other systems are negative except as noted in the HPI and PMH.    Physical Exam Updated Vital Signs BP (!) 146/86 (BP Location: Right Arm)    Pulse (!) 103   Temp 97.8 F (36.6 C) (Oral)   Resp 20   SpO2 100%   Physical Exam Vitals and nursing note reviewed.  Constitutional:      General: He is not in acute distress.  Appearance: He is well-developed and well-nourished.  HENT:     Head: Normocephalic and atraumatic.     Mouth/Throat:     Mouth: Oropharynx is clear and moist.     Pharynx: No oropharyngeal exudate.  Eyes:     Extraocular Movements: EOM normal.     Conjunctiva/sclera: Conjunctivae normal.     Pupils: Pupils are equal, round, and reactive to light.  Neck:     Comments: No C spine tenderness Cardiovascular:     Rate and Rhythm: Normal rate and regular rhythm.     Pulses: Intact distal pulses.     Heart sounds: Normal heart sounds. No murmur heard.   Pulmonary:     Effort: Pulmonary effort is normal. No respiratory distress.     Breath sounds: Normal breath sounds.     Comments: TTP L lateral chest tenderness. No crepitus. No ecchymosis. Chest:     Chest wall: Tenderness present.  Abdominal:     Palpations: Abdomen is soft.     Tenderness: There is no abdominal tenderness. There is no guarding or rebound.  Musculoskeletal:        General: No tenderness or edema. Normal range of motion.     Cervical back: Normal range of motion and neck supple.  Skin:    General: Skin is warm.  Neurological:     Mental Status: He is alert and oriented to person, place, and time.     Cranial Nerves: No cranial nerve deficit.     Motor: No abnormal muscle tone.     Coordination: Coordination normal.     Comments:  5/5 strength throughout. CN 2-12 intact.Equal grip strength.   Psychiatric:        Mood and Affect: Mood and affect normal.        Behavior: Behavior normal.     ED Results / Procedures / Treatments   Labs (all labs ordered are listed, but only abnormal results are displayed) Labs Reviewed  CBC WITH DIFFERENTIAL/PLATELET - Abnormal; Notable for the following components:      Result Value   WBC  14.2 (*)    Neutro Abs 11.8 (*)    Abs Immature Granulocytes 0.35 (*)    All other components within normal limits  BASIC METABOLIC PANEL - Abnormal; Notable for the following components:   CO2 19 (*)    Glucose, Bld 167 (*)    All other components within normal limits  SARS CORONAVIRUS 2 (TAT 6-24 HRS)  TROPONIN I (HIGH SENSITIVITY)    EKG None  Radiology DG Ribs Unilateral W/Chest Left  Result Date: 04/26/2020 CLINICAL DATA:  Left rib pain after fall last night. EXAM: LEFT RIBS AND CHEST - 3+ VIEW COMPARISON:  None. FINDINGS: Mildly displaced fractures are seen involving the lateral portions of the left eighth, ninth and tenth ribs. Mild bibasilar atelectasis is noted. No pneumothorax is noted. Small pleural effusions may be present. IMPRESSION: Mildly displaced left eighth, ninth and tenth rib fractures. Electronically Signed   By: Lupita Raider M.D.   On: 04/26/2020 14:26    Procedures Procedures (including critical care time)  Medications Ordered in ED Medications  oxyCODONE-acetaminophen (PERCOCET/ROXICET) 5-325 MG per tablet 1 tablet (1 tablet Oral Given 04/26/20 1458)  oxyCODONE-acetaminophen (PERCOCET/ROXICET) 5-325 MG per tablet (has no administration in time range)  ondansetron (ZOFRAN) injection 4 mg (has no administration in time range)  fentaNYL (SUBLIMAZE) injection 25 mcg (has no administration in time range)    ED Course  I have reviewed  the triage vital signs and the nursing notes.  Pertinent labs & imaging results that were available during my care of the patient were reviewed by me and considered in my medical decision making (see chart for details).    MDM Rules/Calculators/A&P                         Called last night striking left side.  Denies any head injury.  No blood thinner use.  Pain to palpation of the left ribs and pain with breathing.  X-ray shows fractures of ribs on the left side.  No pneumothorax  Given age and persistent pain, CT scan  is obtained.  Unable to give IV contrast due to anaphylaxis.  Results discussed with Dr. Gerilyn Nestle of radiology.  There are some anterior nondisplaced rib fractures.  No signs of organ injury or pneumothorax. No other traumatic injury noted.  CT head and C-spine are negative  Patient with significant pain on attempted ambulation.  Desaturation to 91%.  Patient only able to take a few steps before having to return to the bed.  He does not feel like he can go home.  Still reporting severe pain in his ribs and pain with coughing and palpation and movement.  Admission for pain control and therapies discussed with Dr. Myna Hidalgo. ED ECG REPORT   Date: 04/26/2020  Rate: 84  Rhythm: normal sinus rhythm  QRS Axis: normal  Intervals: normal  ST/T Wave abnormalities: nonspecific ST/T changes  Conduction Disutrbances:none  Narrative Interpretation:   Old EKG Reviewed: unchanged  I have personally reviewed the EKG tracing and agree with the computerized printout as noted.  Final Clinical Impression(s) / ED Diagnoses Final diagnoses:  Fall, initial encounter  Closed fracture of multiple ribs of left side, initial encounter    Rx / DC Orders ED Discharge Orders    None       Vansh Reckart, Annie Main, MD 04/26/20 2053

## 2020-04-26 NOTE — ED Notes (Signed)
PT ambulated from [facility] and made it about 3-4 feet from bed but returned due to pain. PT does appear to be taking short breaths due to the pain. PT o2 upon sitting back into the bed at 91% RA

## 2020-04-27 ENCOUNTER — Encounter (HOSPITAL_COMMUNITY): Payer: Self-pay | Admitting: Family Medicine

## 2020-04-27 DIAGNOSIS — Z7982 Long term (current) use of aspirin: Secondary | ICD-10-CM | POA: Diagnosis not present

## 2020-04-27 DIAGNOSIS — S2242XA Multiple fractures of ribs, left side, initial encounter for closed fracture: Secondary | ICD-10-CM

## 2020-04-27 DIAGNOSIS — Z20822 Contact with and (suspected) exposure to covid-19: Secondary | ICD-10-CM | POA: Diagnosis not present

## 2020-04-27 DIAGNOSIS — J9601 Acute respiratory failure with hypoxia: Secondary | ICD-10-CM | POA: Diagnosis not present

## 2020-04-27 DIAGNOSIS — I2510 Atherosclerotic heart disease of native coronary artery without angina pectoris: Secondary | ICD-10-CM | POA: Diagnosis not present

## 2020-04-27 DIAGNOSIS — I10 Essential (primary) hypertension: Secondary | ICD-10-CM | POA: Diagnosis not present

## 2020-04-27 DIAGNOSIS — W19XXXA Unspecified fall, initial encounter: Secondary | ICD-10-CM

## 2020-04-27 DIAGNOSIS — Z951 Presence of aortocoronary bypass graft: Secondary | ICD-10-CM | POA: Diagnosis not present

## 2020-04-27 DIAGNOSIS — Z79899 Other long term (current) drug therapy: Secondary | ICD-10-CM | POA: Diagnosis not present

## 2020-04-27 DIAGNOSIS — R52 Pain, unspecified: Secondary | ICD-10-CM

## 2020-04-27 DIAGNOSIS — Z87891 Personal history of nicotine dependence: Secondary | ICD-10-CM | POA: Diagnosis not present

## 2020-04-27 DIAGNOSIS — S299XXA Unspecified injury of thorax, initial encounter: Secondary | ICD-10-CM | POA: Diagnosis present

## 2020-04-27 DIAGNOSIS — W1839XA Other fall on same level, initial encounter: Secondary | ICD-10-CM | POA: Diagnosis not present

## 2020-04-27 LAB — CBC
HCT: 40.9 % (ref 39.0–52.0)
Hemoglobin: 13.7 g/dL (ref 13.0–17.0)
MCH: 29.8 pg (ref 26.0–34.0)
MCHC: 33.5 g/dL (ref 30.0–36.0)
MCV: 88.9 fL (ref 80.0–100.0)
Platelets: 183 10*3/uL (ref 150–400)
RBC: 4.6 MIL/uL (ref 4.22–5.81)
RDW: 13.6 % (ref 11.5–15.5)
WBC: 12.8 10*3/uL — ABNORMAL HIGH (ref 4.0–10.5)
nRBC: 0 % (ref 0.0–0.2)

## 2020-04-27 LAB — SARS CORONAVIRUS 2 (TAT 6-24 HRS): SARS Coronavirus 2: NEGATIVE

## 2020-04-27 LAB — CREATININE, SERUM
Creatinine, Ser: 0.77 mg/dL (ref 0.61–1.24)
GFR, Estimated: >60 mL/min (ref >60)

## 2020-04-27 MED ORDER — ROSUVASTATIN CALCIUM 10 MG PO TABS
10.0000 mg | ORAL_TABLET | Freq: Every day | ORAL | Status: DC
  Administered 2020-04-27 – 2020-04-28 (×2): 10 mg via ORAL
  Filled 2020-04-27 (×2): qty 1

## 2020-04-27 MED ORDER — VITAMIN B-12 1000 MCG PO TABS
1000.0000 ug | ORAL_TABLET | Freq: Every day | ORAL | Status: DC
  Administered 2020-04-27 – 2020-04-28 (×2): 1000 ug via ORAL
  Filled 2020-04-27 (×2): qty 1

## 2020-04-27 MED ORDER — LIDOCAINE 5 % EX PTCH
1.0000 | MEDICATED_PATCH | CUTANEOUS | Status: DC
  Administered 2020-04-27 – 2020-04-28 (×2): 1 via TRANSDERMAL
  Filled 2020-04-27 (×2): qty 1

## 2020-04-27 MED ORDER — HYDROMORPHONE HCL 1 MG/ML IJ SOLN: 1.0000 mg | INTRAMUSCULAR | Status: DC | PRN

## 2020-04-27 MED ORDER — NITROGLYCERIN 0.4 MG SL SUBL: 0.4000 mg | SUBLINGUAL_TABLET | SUBLINGUAL | Status: DC | PRN

## 2020-04-27 MED ORDER — SERTRALINE HCL 50 MG PO TABS
100.0000 mg | ORAL_TABLET | Freq: Every day | ORAL | Status: DC
  Administered 2020-04-27 – 2020-04-28 (×2): 100 mg via ORAL
  Filled 2020-04-27 (×2): qty 2

## 2020-04-27 MED ORDER — FINASTERIDE 5 MG PO TABS
5.0000 mg | ORAL_TABLET | Freq: Every day | ORAL | Status: DC
  Administered 2020-04-27 – 2020-04-28 (×2): 5 mg via ORAL
  Filled 2020-04-27 (×2): qty 1

## 2020-04-27 MED ORDER — GABAPENTIN 300 MG PO CAPS
300.0000 mg | ORAL_CAPSULE | Freq: Every day | ORAL | Status: DC
  Administered 2020-04-27 – 2020-04-28 (×2): 300 mg via ORAL
  Filled 2020-04-27 (×2): qty 1

## 2020-04-27 MED ORDER — HYDRALAZINE HCL 25 MG PO TABS: 25.0000 mg | ORAL_TABLET | Freq: Two times a day (BID) | ORAL | Status: DC | PRN

## 2020-04-27 MED ORDER — MECLIZINE HCL 25 MG PO TABS: 12.5000 mg | ORAL_TABLET | Freq: Three times a day (TID) | ORAL | Status: DC | PRN

## 2020-04-27 MED ORDER — ENOXAPARIN SODIUM 40 MG/0.4ML ~~LOC~~ SOLN
40.0000 mg | SUBCUTANEOUS | Status: DC
  Administered 2020-04-27 – 2020-04-28 (×2): 40 mg via SUBCUTANEOUS
  Filled 2020-04-27 (×2): qty 0.4

## 2020-04-27 MED ORDER — ASPIRIN EC 81 MG PO TBEC
81.0000 mg | DELAYED_RELEASE_TABLET | Freq: Every day | ORAL | Status: DC
  Administered 2020-04-27 – 2020-04-28 (×2): 81 mg via ORAL
  Filled 2020-04-27 (×2): qty 1

## 2020-04-27 MED ORDER — METOPROLOL SUCCINATE ER 25 MG PO TB24
25.0000 mg | ORAL_TABLET | Freq: Every day | ORAL | Status: DC
  Administered 2020-04-27: 25 mg via ORAL
  Filled 2020-04-27: qty 1

## 2020-04-27 MED ORDER — PANTOPRAZOLE SODIUM 40 MG PO TBEC
40.0000 mg | DELAYED_RELEASE_TABLET | Freq: Every day | ORAL | Status: DC
  Administered 2020-04-27 – 2020-04-28 (×2): 40 mg via ORAL
  Filled 2020-04-27 (×2): qty 1

## 2020-04-27 MED ORDER — TIZANIDINE HCL 4 MG PO TABS: 2.0000 mg | ORAL_TABLET | Freq: Three times a day (TID) | ORAL | Status: DC | PRN

## 2020-04-27 MED ORDER — TRAMADOL HCL 50 MG PO TABS: 50.0000 mg | ORAL_TABLET | Freq: Three times a day (TID) | ORAL | Status: DC | PRN

## 2020-04-27 MED ORDER — LISINOPRIL 5 MG PO TABS: 5.0000 mg | ORAL_TABLET | Freq: Every day | ORAL | Status: DC

## 2020-04-27 MED ORDER — TAMSULOSIN HCL 0.4 MG PO CAPS
0.4000 mg | ORAL_CAPSULE | Freq: Every day | ORAL | Status: DC
  Administered 2020-04-27 – 2020-04-28 (×2): 0.4 mg via ORAL
  Filled 2020-04-27 (×2): qty 1

## 2020-04-27 MED ORDER — ISOSORBIDE MONONITRATE ER 30 MG PO TB24
30.0000 mg | ORAL_TABLET | Freq: Every day | ORAL | Status: DC
  Administered 2020-04-27: 30 mg via ORAL
  Filled 2020-04-27: qty 1

## 2020-04-27 NOTE — ED Notes (Signed)
After fentanyl oxygen sat decreased to 88%. Pt placed on oxygen 2L via [place] with sat improvement to 97%.

## 2020-04-27 NOTE — Evaluation (Signed)
Physical Therapy Evaluation Patient Details Name: Troy Combs MRN: [id number] DOB: 1931-12-04 Today's Date: 04/27/2020   History of Present Illness  "Patient admitted after a mechanical fall at home.  He denies any syncopal episode or any presyncopal symptoms.  Found to have fracture of the 8th, 9th and 10th rib on the left.  No pneumothorax noted on imaging studies.  Discussed with trauma surgery who recommend incentive spirometry mobilization pain control."  Clinical Impression  Pt admitted with above diagnosis.  Pt currently with functional limitations due to the deficits listed below (see PT Problem List). Pt will benefit from skilled PT to increase their independence and safety with mobility to allow discharge to the venue listed below.  Pt reports some dyspnea with showering for a few months at baseline (states he has been to pulmonologist and nothing was diagnosed).  Pt states he and his wife have a caregiver that assists with meals, cleaning, etc.  Pt requiring assist for bed mobility and transfers due to left flank pain however able to ambulate short distance in room today.  Pt requiring supplemental oxygen (see mobility section below).  Pt declines SNF, however very agreeable to more care at home.  Pt would benefit from [facility] PT, OT, and aide if possible.    Follow Up Recommendations Home health PT;Supervision/Assistance - 24 hour (pt declines SNF, would like more home care)    Equipment Recommendations  None recommended by PT    Recommendations for Other Services       Precautions / Restrictions Precautions Precautions: Fall Precaution Comments: monitor sats Restrictions Weight Bearing Restrictions: No      Mobility  Bed Mobility Overal bed mobility: Needs Assistance Bed Mobility: Rolling;Sidelying to Sit Rolling: Min guard Sidelying to sit: Mod assist       General bed mobility comments: assist for trunk upright due to rib pain    Transfers Overall transfer  level: Needs assistance Equipment used: Rolling walker (2 wheeled) Transfers: Sit to/from Stand Sit to Stand: Min assist         General transfer comment: verbal cues for safe technique, assist to rise and steady  Ambulation/Gait Ambulation/Gait assistance: Min guard Gait Distance (Feet): 15 Feet Assistive device: Rolling walker (2 wheeled) Gait Pattern/deviations: Step-through pattern;Decreased stride length     General Gait Details: slow cautious gait, SpO2 dropped to 87% on room air so reapplied 2L O2 [place] and SPO2 improved to 92%; RN notified  Stairs            Wheelchair Mobility    Modified Rankin (Stroke Patients Only)       Balance Overall balance assessment: History of Falls                                           Pertinent Vitals/Pain Pain Assessment: 0-10 Pain Score: 8  Pain Location: left flank Pain Descriptors / Indicators: Grimacing;Guarding;Sore;Sharp Pain Intervention(s): Repositioned;Monitored during session (RN reports pt had tylenol just prior to session)    Home Living Family/patient expects to be discharged to:: Private residence Living Arrangements: Spouse/significant other Available Help at Discharge: Family;Personal care attendant;Available PRN/intermittently Type of Home: House Home Access: Level entry     Home Layout: One level Home Equipment: Shower seat - built in;Hand held shower head;Grab bars - tub/shower;Walker - 2 wheels;Transport chair      Prior Function Level of Independence: Independent with assistive device(s)  Comments: uses RW for ambulation, reports SOB and fatigue with bathing the last few months (has seen a pulmonologist per pt)     Hand Dominance        Extremity/Trunk Assessment        Lower Extremity Assessment Lower Extremity Assessment: Generalized weakness       Communication   Communication: No difficulties  Cognition Arousal/Alertness: Awake/alert Behavior  During Therapy: WFL for tasks assessed/performed Overall Cognitive Status: Within Functional Limits for tasks assessed                                        General Comments      Exercises     Assessment/Plan    PT Assessment Patient needs continued PT services  PT Problem List Decreased mobility;Decreased balance;Decreased knowledge of use of DME;Decreased strength       PT Treatment Interventions DME instruction;Gait training;Balance training;Therapeutic exercise;Functional mobility training;Therapeutic activities;Patient/family education    PT Goals (Current goals can be found in the Care Plan section)  Acute Rehab PT Goals PT Goal Formulation: With patient Time For Goal Achievement: 05/11/20 Potential to Achieve Goals: Good    Frequency Min 3X/week   Barriers to discharge        Co-evaluation               AM-PAC PT "6 Clicks" Mobility  Outcome Measure Help needed turning from your back to your side while in a flat bed without using bedrails?: A Little Help needed moving from lying on your back to sitting on the side of a flat bed without using bedrails?: A Lot Help needed moving to and from a bed to a chair (including a wheelchair)?: A Lot Help needed standing up from a chair using your arms (e.g., wheelchair or bedside chair)?: A Lot Help needed to walk in hospital room?: A Little Help needed climbing 3-5 steps with a railing? : A Little 6 Click Score: 15    End of Session Equipment Utilized During Treatment: Gait belt Activity Tolerance: Patient limited by pain Patient left: in chair;with call bell/phone within reach;with chair alarm set Nurse Communication: Mobility status PT Visit Diagnosis: Difficulty in walking, not elsewhere classified (R26.2)    Time: [id number] PT Time Calculation (min) (ACUTE ONLY): 23 min   Charges:   PT Evaluation $PT Eval Low Complexity: 1 Low        Kati PT, DPT Acute Rehabilitation  Services Pager: [phone] Office: [phone]  York Ram E 04/27/2020, 2:59 PM

## 2020-04-27 NOTE — H&P (Signed)
History and Physical    Troy Combs [id number] DOB: 11-08-1931 DOA: 04/26/2020  PCP: Antony Contras, MD   Patient coming from: [facility] ER via home.  Chief Complaint: left rib pain after fall at home.  HPI: Troy Combs is a 86 y.o. male with medical history significant for hypertension, chronic back pain, GERD, previous CABG, previous liver laceration who presents by EMS for evaluation of left-sided rib pain after stumbling last night and hitting the side of a recliner.  States he normally walks with a walker and somehow lost his balance and hit his left side on an upolstered arm of chair on night of 12/28. He states he called his PCP who called out a pain medication for him but it did not help. He does not remember the name of the medication.  He has severe pain with breathing and movement. He was given oxycodone at ER initially which made him vomit. He denies hitting his head or losing consciousness.  Denies any neck pain.  His back pain is chronic and unchanged.  Denies any blood thinner use.  Denies any pain in arms or legs and denies any numbness in extremities.  Denies any history of asthma or COPD.  ED Course: Found to have fracture of left 7-9 ribs which are nondisplaced. Required IV pain medication for comfort.   Review of Systems:  General: Denies weakness, fever, chills, weight loss, night sweats.  Denies dizziness.  Denies change in appetite HENT: Denies head trauma, headache, denies change in hearing, tinnitus.  Denies nasal congestion or bleeding.  Denies sore throat, sores in mouth.  Denies difficulty swallowing Eyes: Denies blurry vision, pain in eye, drainage.  Denies discoloration of eyes. Neck: Denies pain.  Denies swelling.  Denies pain with movement. Cardiovascular: Denies chest pain, palpitations.  Denies edema.  Denies orthopnea Respiratory: Denies shortness of breath, cough.  Denies wheezing.  Denies sputum production Gastrointestinal: Denies abdominal pain,  swelling.  Denies nausea, vomiting, diarrhea.  Denies melena.  Denies hematemesis. Musculoskeletal: Reports left ribcage pain. Denies limitation of movement.  Denies deformity. Denies arthralgias or myalgias. Genitourinary: Denies pelvic pain.  Denies urinary frequency or hesitancy.  Denies dysuria.  Skin: Denies rash.  Denies petechiae, purpura, ecchymosis. Neurological: Denies headache.  Denies syncope.  Denies seizure activity.  Denies weakness or paresthesia.  Denies slurred speech, drooping face.  Denies visual change. Psychiatric: Denies depression, anxiety. Denies hallucinations.  Past Medical History:  Diagnosis Date  . Allergic rhinitis 09/20/2014  . Anemia, unspecified   . Arthritis   . Atrial flutter ([place])    a. s/p RFCA remotely.  . Autonomic dysfunction   . Bladder neck obstruction   . Cataracts, bilateral   . Coronary atherosclerosis of unspecified type of vessel, native or graft    CABG 2002  . Diminished hearing, bilateral   . Dysthymic disorder   . GERD (gastroesophageal reflux disease)   . Heart murmur    as a child  . Impaired glucose tolerance 12/02/2012  . Impotence of organic origin   . Lumbar radiculopathy   . Malignant melanoma of skin of upper limb, including shoulder ([place])   . Old myocardial infarction   . Personal history of malignant melanoma of skin   . Personal history of other diseases of digestive system   . PVC's (premature ventricular contractions)   . Thoracic or lumbosacral neuritis or radiculitis, unspecified   . Unspecified essential hypertension     Past Surgical History:  Procedure Laterality Date  .  ARTHRODESIS  07/06/2002   of left long finger distal interphalangeal joint with Kirschner wire fixation X 3  . ATRIAL ABLATION SURGERY  05/04/2001   Dr. [name]  . CARDIAC CATHETERIZATION  09/05/2008   Revealing 4 of 4 patent grafts with native multivessel coronary artery disease, EF  of 60% without regional wall motion abnormalities.   Marland Kitchen CATARACT EXTRACTION, BILATERAL    . CHOLECYSTECTOMY    . COLONOSCOPY    . CORONARY ANGIOPLASTY  1993  . CORONARY ARTERY BYPASS GRAFT  10/17/2000   [name]. [name], [place]     AFTER LIVER TRAUMA AND HAND SURGERY  . EYE SURGERY    . HERNIA REPAIR     BILATERAL  . LEFT HEART CATH AND CORS/GRAFTS ANGIOGRAPHY N/A 04/03/2017   Procedure: LEFT HEART CATH AND CORS/GRAFTS ANGIOGRAPHY;  Surgeon: Leonie Man, MD;  Location: [place] [facility];  Service: Cardiovascular;  Laterality: N/A;  . LUMBAR LAMINECTOMY/DECOMPRESSION MICRODISCECTOMY N/A 02/02/2016   Procedure: MICRODISCECTOMY LUMBAR FOUR- LUMBAR FIVE;  Surgeon: Ashok Pall, MD;  Location: [place];  Service: Neurosurgery;  Laterality: N/A;  MICRODISCECTOMY L4-L5  . LUMBAR LAMINECTOMY/DECOMPRESSION MICRODISCECTOMY Right 08/02/2016   Procedure: MICRODISCECTOMY LUMBAR FOUR- LUMBAR FIVE RIGHT;  Surgeon: Ashok Pall, MD;  Location: [place];  Service: Neurosurgery;  Laterality: Right;  . punctured eardrum      to relieve blood build up   for ear infection  . TONSILLECTOMY      Social History  reports that he quit smoking about 64 years ago. His smoking use included cigarettes. He has a 3.00 pack-year smoking history. He has never used smokeless tobacco. He reports current alcohol use. He reports that he does not use drugs.  Allergies  Allergen Reactions  . Iohexol Other (See Comments)     Code: HIVES, Desc: PATENT STATES HE IS ALLERGIC TO IV DYE 09/14/08/RM, Onset Date: [id number]   . Ciprofloxacin Hives and Rash    "Patient unsure" just know he has a reaction to med Medical record indicates allergic reaction  . Other Other (See Comments)    UNSPECIFIED REACTION  Angioplasty dye pt.>> Pt only recalls Had "very bad reaction"    Family History  Problem Relation Age of Onset  . Heart disease Father   . Ataxia Neg Hx   . Chorea Neg Hx   . Dementia Neg Hx   . Mental retardation Neg Hx   . Migraines Neg Hx   .  Multiple sclerosis Neg Hx   . Neurofibromatosis Neg Hx   . Neuropathy Neg Hx   . Parkinsonism Neg Hx   . Seizures Neg Hx   . Stroke Neg Hx      Prior to Admission medications   Medication Sig Start Date End Date Taking? Authorizing Provider  aspirin EC 81 MG tablet Take 81 mg by mouth daily after supper.     [provider]  finasteride (PROSCAR) 5 MG tablet Take 1 tablet (5 mg total) by mouth daily after supper. 09/03/19   Granville Lewis C, PA-C  gabapentin (NEURONTIN) 300 MG capsule Take 1 capsule (300 mg total) by mouth daily after supper. 09/03/19   Granville Lewis C, PA-C  hydrALAZINE (APRESOLINE) 25 MG tablet Take 25 mg by mouth 3 times/day as needed-between meals & bedtime (high bp).    [provider]  isosorbide mononitrate (IMDUR) 30 MG 24 hr tablet Take 1 tablet (30 mg total) by mouth daily. 02/22/20 05/22/20  Charlie Pitter, PA-C  midodrine (PROAMATINE) 10 MG tablet TAKE 1 TABLET BY MOUTH THREE TIMES A DAY 10/29/19   Dorothy Spark, MD  nitroGLYCERIN (NITROSTAT) 0.4 MG SL tablet Place 1 tablet (0.4 mg total) under the tongue every 5 (five) minutes as needed for chest pain (x 3 doses). Reported on 06/15/2015 09/03/19   Wille Celeste, PA-C  NON FORMULARY DIET :Regular NAS    [provider]  ondansetron (ZOFRAN ODT) 4 MG disintegrating tablet Take 1 tablet (4 mg total) by mouth every 8 (eight) hours as needed for nausea or vomiting. 09/03/19   Granville Lewis C, PA-C  rosuvastatin (CRESTOR) 10 MG tablet Take 1 tablet (10 mg total) by mouth daily. 09/03/19   Granville Lewis C, PA-C  sertraline (ZOLOFT) 100 MG tablet Take 1 tablet (100 mg total) by mouth daily. 09/03/19   Granville Lewis C, PA-C  tamsulosin (FLOMAX) 0.4 MG CAPS capsule Take 1 capsule (0.4 mg total) by mouth daily after supper. 09/03/19   Granville Lewis C, PA-C  tiZANidine (ZANAFLEX) 2 MG tablet Take 1 tablet (2 mg total) by mouth See admin instructions. Take one tablet (2 mg) by mouth every morning, take one tablet (2 mg)  later in the day as needed for pain 09/03/19   Granville Lewis C, PA-C  traMADol (ULTRAM) 50 MG tablet Take 0.5 tablets (25 mg total) by mouth at bedtime as needed. For 14 days for neck pain 09/03/19   Wille Celeste, [place]    Physical Exam: Vitals:   04/27/20 0245 04/27/20 0256 04/27/20 0258 04/27/20 0440  BP: (!) 185/94   (!) 186/98  Pulse: 76   73  Resp: 14   18  Temp: 98.2 F (36.8 C)   97.9 F (36.6 C)  TempSrc: Oral   Oral  SpO2: 92% (!) 88% 97% 99%    Constitutional: NAD, calm, comfortable Vitals:   04/27/20 0245 04/27/20 0256 04/27/20 0258 04/27/20 0440  BP: (!) 185/94   (!) 186/98  Pulse: 76   73  Resp: 14   18  Temp: 98.2 F (36.8 C)   97.9 F (36.6 C)  TempSrc: Oral   Oral  SpO2: 92% (!) 88% 97% 99%   General: WDWN, Alert and oriented x3.  Eyes: EOMI, PERRL, conjunctivae normal.  Sclera nonicteric HENT:  [place]/AT, external ears normal. Nares patent without epistasis.  Mucous membranes are moist.  Neck: Soft, normal range of motion, supple, no masses, no thyromegaly.  Trachea midline Respiratory: clear to auscultation bilaterally, no wheezing, no crackles. Normal respiratory effort. No accessory muscle use.  Cardiovascular: Regular rate and rhythm, no murmurs / rubs / gallops. No extremity edema.  Abdomen: Soft, no tenderness, nondistended, no rebound or guarding. No masses palpated. Bowel sounds normoactive Musculoskeletal: FROM of upper and lower extremities. Has tenderness to palpation of left lateral lower ribcage. No paradoxical motion of ribcage or step deformity. no clubbing / cyanosis. Normal muscle tone.  Skin: Warm, dry, intact no rashes, lesions, ulcers. No induration Neurologic: CN 2-12 grossly intact.  Normal speech.  Sensation intact, Strength 5/5 in all extremities.   Psychiatric: Normal judgment and insight.  Normal mood.    Labs on Admission: I have personally reviewed following labs and imaging studies  CBC: Recent Labs  Lab 04/26/20 1717  WBC 14.2*   NEUTROABS 11.8*  HGB 14.9  HCT 43.0  MCV 86.7  PLT [id number]    Basic Metabolic Panel: Recent Labs  Lab 04/26/20 1811  NA 135  K 4.0  CL 105  CO2 19*  GLUCOSE 167*  BUN 23  CREATININE 0.75  CALCIUM 9.0    GFR: CrCl cannot be calculated (Unknown ideal weight.).  Liver Function Tests: No results for input(s): AST, ALT, ALKPHOS, BILITOT, PROT, ALBUMIN in the last 168 hours.  Urine analysis:    Component Value Date/Time   COLORURINE YELLOW 08/15/2019 1736   APPEARANCEUR CLEAR 08/15/2019 1736   LABSPEC 1.015 08/15/2019 1736   PHURINE 8.0 08/15/2019 1736   GLUCOSEU NEGATIVE 08/15/2019 1736   GLUCOSEU NEGATIVE 01/24/2016 0955   HGBUR NEGATIVE 08/15/2019 1736   BILIRUBINUR NEGATIVE 08/15/2019 1736   KETONESUR NEGATIVE 08/15/2019 1736   PROTEINUR NEGATIVE 08/15/2019 1736   UROBILINOGEN 2.0 (A) 01/24/2016 0955   NITRITE NEGATIVE 08/15/2019 1736   LEUKOCYTESUR NEGATIVE 08/15/2019 1736    Radiological Exams on Admission: CT ABDOMEN PELVIS WO CONTRAST  Addendum Date: 04/26/2020   ADDENDUM REPORT: 04/26/2020 18:02 ADDENDUM: Nondisplaced fractures of the left anterior seventh, eighth, and ninth ribs are identified. Electronically Signed   By: Lucienne Capers M.D.   On: 04/26/2020 18:02   Result Date: 04/26/2020 CLINICAL DATA:  Patient fell last night striking the left side. Pain and difficulty taking a breath. Allergic to IV contrast. EXAM: CT CHEST, ABDOMEN AND PELVIS WITHOUT CONTRAST TECHNIQUE: Multidetector CT imaging of the chest, abdomen and pelvis was performed following the standard protocol without IV contrast. COMPARISON:  CT abdomen and pelvis 05/13/2014.  CT chest 09/14/2008 FINDINGS: CT CHEST FINDINGS Cardiovascular: Normal heart size. No pericardial effusions. Normal caliber thoracic aorta with scattered aortic calcification. Postoperative changes in the mediastinum consistent with coronary bypass. Mediastinum/Nodes: No abnormal mediastinal gas or hematoma. Thyroid  gland is unremarkable. No significant lymphadenopathy. Esophagus is decompressed. Lungs/Pleura: Mild peripheral bronchiectasis in the bases. Linear atelectasis versus fibrosis in the lung bases. No airspace disease or consolidation. No pleural effusion. No pneumothorax. Airways are clear. Musculoskeletal: Mild degenerative changes in the spine. No vertebral compression deformities. Sternotomy wires. No depressed sternal fractures. The ribs appear intact. No displaced rib fractures identified. CT ABDOMEN PELVIS FINDINGS Hepatobiliary: Chronic calcification along the liver edge, unchanged. Gallbladder appears surgically absent. No bile duct dilatation. Pancreas: Unremarkable. No pancreatic ductal dilatation or surrounding inflammatory changes. Spleen: Normal in size without focal abnormality. Adrenals/Urinary Tract: Right renal cyst. Adrenal glands, kidneys, ureters, and bladder are otherwise unremarkable. Stomach/Bowel: Stomach, small bowel, and colon are mostly decompressed with scattered stool in the colon. Scattered colonic diverticula. No inflammatory changes. Appendix is normal. Vascular/Lymphatic: Aortic atherosclerosis. No enlarged abdominal or pelvic lymph nodes. Reproductive: Uterus and bilateral adnexa are unremarkable. Other: No abdominal wall hernia or abnormality. No abdominopelvic ascites. Musculoskeletal: Normal alignment of the lumbar spine. Degenerative changes. No vertebral compression deformities. Sacrum, pelvis, and hips appear intact. No displaced fractures identified. IMPRESSION: No acute posttraumatic changes demonstrated in the chest, abdomen, or pelvis. No evidence of mediastinal or pulmonary parenchymal injury. No evidence of solid organ injury or bowel perforation. Aortic Atherosclerosis (ICD10-I70.0). Electronically Signed: By: Lucienne Capers M.D. On: 04/26/2020 17:45   DG Ribs Unilateral W/Chest Left  Result Date: 04/26/2020 CLINICAL DATA:  Left rib pain after fall last night.  EXAM: LEFT RIBS AND CHEST - 3+ VIEW COMPARISON:  None. FINDINGS: Mildly displaced fractures are seen involving the lateral portions of the left eighth, ninth and tenth ribs. Mild bibasilar atelectasis is noted. No pneumothorax is noted. Small pleural effusions may be present. IMPRESSION: Mildly displaced left eighth, ninth and tenth rib fractures. Electronically Signed   By: Bobbe Medico.D.  On: 04/26/2020 14:26   CT Head Wo Contrast  Result Date: 04/26/2020 CLINICAL DATA:  86 year old post fall last night hitting left side. EXAM: CT HEAD WITHOUT CONTRAST TECHNIQUE: Contiguous axial images were obtained from the base of the skull through the vertex without intravenous contrast. COMPARISON:  Most recent head CT 01/10/2015. Brain MRI 08/12/2019 also reviewed. FINDINGS: Brain: Generalized atrophy with advanced chronic small vessel ischemia. No intracranial hemorrhage, mass effect, or midline shift. No hydrocephalus. The basilar cisterns are patent. No evidence of territorial infarct or acute ischemia. No extra-axial or intracranial fluid collection. Vascular: Atherosclerosis of skullbase vasculature without hyperdense vessel or abnormal calcification. Skull: No fracture or focal lesion. Sinuses/Orbits: Paranasal sinuses and mastoid air cells are clear. The visualized orbits are unremarkable. Bilateral cataract resection Other: None. IMPRESSION: 1. No acute intracranial abnormality. No skull fracture. 2. Generalized atrophy and advanced chronic small vessel ischemia. Electronically Signed   By: Narda Rutherford M.D.   On: 04/26/2020 17:40   CT Chest Wo Contrast  Addendum Date: 04/26/2020   ADDENDUM REPORT: 04/26/2020 18:02 ADDENDUM: Nondisplaced fractures of the left anterior seventh, eighth, and ninth ribs are identified. Electronically Signed   By: Burman Nieves M.D.   On: 04/26/2020 18:02   Result Date: 04/26/2020 CLINICAL DATA:  Patient fell last night striking the left side. Pain and  difficulty taking a breath. Allergic to IV contrast. EXAM: CT CHEST, ABDOMEN AND PELVIS WITHOUT CONTRAST TECHNIQUE: Multidetector CT imaging of the chest, abdomen and pelvis was performed following the standard protocol without IV contrast. COMPARISON:  CT abdomen and pelvis 05/13/2014.  CT chest 09/14/2008 FINDINGS: CT CHEST FINDINGS Cardiovascular: Normal heart size. No pericardial effusions. Normal caliber thoracic aorta with scattered aortic calcification. Postoperative changes in the mediastinum consistent with coronary bypass. Mediastinum/Nodes: No abnormal mediastinal gas or hematoma. Thyroid gland is unremarkable. No significant lymphadenopathy. Esophagus is decompressed. Lungs/Pleura: Mild peripheral bronchiectasis in the bases. Linear atelectasis versus fibrosis in the lung bases. No airspace disease or consolidation. No pleural effusion. No pneumothorax. Airways are clear. Musculoskeletal: Mild degenerative changes in the spine. No vertebral compression deformities. Sternotomy wires. No depressed sternal fractures. The ribs appear intact. No displaced rib fractures identified. CT ABDOMEN PELVIS FINDINGS Hepatobiliary: Chronic calcification along the liver edge, unchanged. Gallbladder appears surgically absent. No bile duct dilatation. Pancreas: Unremarkable. No pancreatic ductal dilatation or surrounding inflammatory changes. Spleen: Normal in size without focal abnormality. Adrenals/Urinary Tract: Right renal cyst. Adrenal glands, kidneys, ureters, and bladder are otherwise unremarkable. Stomach/Bowel: Stomach, small bowel, and colon are mostly decompressed with scattered stool in the colon. Scattered colonic diverticula. No inflammatory changes. Appendix is normal. Vascular/Lymphatic: Aortic atherosclerosis. No enlarged abdominal or pelvic lymph nodes. Reproductive: Uterus and bilateral adnexa are unremarkable. Other: No abdominal wall hernia or abnormality. No abdominopelvic ascites. Musculoskeletal:  Normal alignment of the lumbar spine. Degenerative changes. No vertebral compression deformities. Sacrum, pelvis, and hips appear intact. No displaced fractures identified. IMPRESSION: No acute posttraumatic changes demonstrated in the chest, abdomen, or pelvis. No evidence of mediastinal or pulmonary parenchymal injury. No evidence of solid organ injury or bowel perforation. Aortic Atherosclerosis (ICD10-I70.0). Electronically Signed: By: Burman Nieves M.D. On: 04/26/2020 17:45   CT Cervical Spine Wo Contrast  Result Date: 04/26/2020 CLINICAL DATA:  Fall last night striking left side. EXAM: CT CERVICAL SPINE WITHOUT CONTRAST TECHNIQUE: Multidetector CT imaging of the cervical spine was performed without intravenous contrast. Multiplanar CT image reconstructions were also generated. COMPARISON:  None. FINDINGS: Alignment: Straightening of normal lordosis. No traumatic subluxation.  Skull base and vertebrae: No acute fracture. Vertebral body heights are maintained. The dens and skull base are intact. Soft tissues and spinal canal: No prevertebral fluid or swelling. No visible canal hematoma. Disc levels: Multilevel degenerative disc disease most prominent at C6-C7 with near complete disc space loss. There is multilevel facet hypertrophy. No high-grade canal stenosis. Upper chest: Assessed on concurrent chest CT, reported separately. Other: None. IMPRESSION: Degenerative change in the cervical spine without acute fracture or subluxation. Electronically Signed   By: Keith Rake M.D.   On: 04/26/2020 17:42    EKG: Independently reviewed.  Normal sinus rhythm with nonstick ST changes.  No acute ST elevation or depression.  QTc 425  Assessment/Plan Principal Problem:   Multiple closed fractures of ribs of left side Mr. Pettus has fractures of ribs 7-9 on left side. Has intractable pain and is placed on med-surg floor for observation and pain control.  Consult PT for evaluation  Active Problems:    Essential hypertension Continue home medication regimen. Monitor BP.     Intractable pain Tramadol for mild to moderate pain. Dilaudid ordered for severe pain    Fall at home Consult PT for evaluation    DVT prophylaxis: Padua score elevated.  Lovenox for DVT prophylaxis Code Status:   Full code Family Communication:  Diagnosis and plan discussed with patient.  He verbalized understanding agrees with plan.  Further recommendations to follow as clinically indicated Disposition Plan:   Patient is from:  Home  Anticipated DC to:  Home  Anticipated DC date:  Anticipate hospitalization for 24 to 36-hour  Anticipated DC barriers: No barriers to discharge identified at this time  Admission status:  Observation   Eben Burow MD [facility]  How to contact the [facility] Attending or Consulting provider [place] or covering provider during after hours [place], for this patient?   1. Check the care team in [facility] and look for a) attending/consulting [facility] provider listed and b) the [facility] team listed 2. Log into [email or web address] and use [place]'s universal password to access. If you do not have the password, please contact the hospital operator. 3. Locate the [facility] provider you are looking for under [facility] and page to a number that you can be directly reached. 4. If you still have difficulty reaching the provider, please page the [facility] (Director on Call) for the Hospitalists listed on amion for assistance.  04/27/2020, 6:06 AM

## 2020-04-27 NOTE — Progress Notes (Signed)
Patient admitted earlier this morning.  H&P reviewed.  Patient seen and examined.   Patient with significant pain on the left side with any movement.  Gets short of breath when he moves around.  No nausea or vomiting.  Lives with his wife.  Noted to be on 2 L of oxygen this morning.  Saturations were in the late 80s overnight. Vital signs otherwise stable. Lungs clear to auscultation bilaterally with diminished air entry on the left.  No definite crackles or wheezing. S1-S2 is normal regular. Abdomen is soft.   Patient admitted after a mechanical fall at home.  He denies any syncopal episode or any presyncopal symptoms.  Found to have fracture of the 8th, 9th and 10th rib on the left.  No pneumothorax noted on imaging studies.  Discussed with trauma surgery who recommend incentive spirometry mobilization pain control.  Patient is on tramadol currently.  Continue for now.  Lidoderm patch may also provide some benefit.  Continue with incentive spirometry.  Mobilize.  PT has been consulted.  Will likely need to stay in the hospital for better pain control.  Need to repeat imaging studies per trauma.  We will continue to follow.  Osvaldo Shipper 04/27/2020

## 2020-04-27 NOTE — TOC Initial Note (Signed)
Transition of Care [facility]) - Initial/Assessment Note    Patient Details  Name: Troy Combs MRN: [id number] Date of Birth: 01/10/1932  Transition of Care [facility]) CM/SW Contact:    Lia Hopping, [place] Phone Number: 04/27/2020, 5:49 PM  Clinical Narrative:     Patient admitted after a mechanical fall at home. He was found to have have fracture of the 8th, 9th and 10th rib.             CSW met with the patient at bedside to discuss home health. Patient declines SNF placement.Patient lives with his spouse.  Patient reports prior to his fall he was ambulatory with a walker and independent with his ADL's. Patient reports he was active with home health in the past however he cannot recall the name. CSW will provide the patient with a Medicare.gov list of home health agencies and arrange services.   TOC staff will continue to follow this patient.   Expected Discharge Plan: [place] Barriers to Discharge: Continued Medical Work up   Patient Goals and CMS Choice Patient states their goals for this hospitalization and ongoing recovery are:: return home with home health CMS Medicare.gov Compare Post Acute Care list provided to:: Patient Choice offered to / list presented to : Patient  Expected Discharge Plan and Services Expected Discharge Plan: [place] In-house Referral: Clinical Social Work Discharge Planning Services: CM Consult Post Acute Care Choice: [place] arrangements for the past 2 months: [place]                                      Prior Living Arrangements/Services Living arrangements for the past 2 months: Single Family Home Lives with:: Spouse Patient language and need for interpreter reviewed:: No Do you feel safe going back to the place where you live?: Yes      Need for Family Participation in Patient Care: Yes (Comment) Care giver support system in place?: Yes (comment) Current home services:  DME    Activities of Daily Living Home Assistive Devices/Equipment: [place] Rile (specify type) ADL Screening (condition at time of admission) Patient's cognitive ability adequate to safely complete daily activities?: Yes Is the patient deaf or have difficulty hearing?: No Does the patient have difficulty seeing, even when wearing glasses/contacts?: No Does the patient have difficulty concentrating, remembering, or making decisions?: No Patient able to express need for assistance with ADLs?: Yes Does the patient have difficulty dressing or bathing?: No Independently performs ADLs?: Yes (appropriate for developmental age) Does the patient have difficulty walking or climbing stairs?: Yes Weakness of Legs: Both Weakness of Arms/Hands: None  Permission Sought/Granted Permission sought to share information with : Case Manager Permission granted to share information with : Yes, Verbal Permission Granted     Permission granted to share info w AGENCY: Home Health Agency        Emotional Assessment Appearance:: Appears stated age Attitude/Demeanor/Rapport: Engaged Affect (typically observed): Accepting Orientation: : Oriented to Self,Oriented to Place,Oriented to  Time,Oriented to Situation Alcohol / Substance Use: Not Applicable Psych Involvement: No (comment)  Admission diagnosis:  Rib fractures [S22.49XA] Fall, initial encounter [W19.XXXA] Closed fracture of multiple ribs of left side, initial encounter [S22.42XA] Multiple closed fractures of ribs of left side [S22.42XA] Patient Active Problem List   Diagnosis Date Noted  . Multiple closed fractures of ribs of left side 04/27/2020  .  Fall 04/27/2020  . Intractable pain 04/27/2020  . Rib fractures 04/26/2020  . Postural dizziness with presyncope 08/13/2019  . Hypertensive urgency 08/12/2019  . Impacted cerumen of both ears 08/12/2019  . Personal history of malignant melanoma of skin   . Lumbar radiculopathy   . Heart murmur   .  GERD (gastroesophageal reflux disease)   . Diminished hearing, bilateral   . Cataracts, bilateral   . Bladder neck obstruction   . Arthritis   . Constipation 12/12/2016  . Nausea 12/12/2016  . Coronary artery disease involving native coronary artery of native heart without angina pectoris 11/22/2016  . Dyslipidemia 11/22/2016  . History of coronary artery bypass surgery 11/22/2016  . Depression with anxiety   . Orthostasis 09/20/2016  . HNP (herniated nucleus pulposus), lumbar 02/02/2016  . Spinal stenosis of lumbar region 01/24/2016  . Sciatica, right side 01/24/2016  . Prediabetes 01/24/2016  . Otitis, externa, infective 01/24/2016  . Left lumbar radiculopathy 01/15/2016  . PVC's (premature ventricular contractions) 05/24/2015  . Peripheral polyneuropathy 03/06/2015  . BPPV (benign paroxysmal positional vertigo), right 03/06/2015  . Headache 01/18/2015  . Vertigo 01/18/2015  . Allergic rhinitis 09/20/2014  . Dyspnea on exertion 08/10/2014  . Severe dizziness 08/10/2014  . Diarrhea 05/18/2014  . LLQ pain 05/13/2014  . Chronic meniscal tear of knee 09/22/2013  . Primary localized osteoarthrosis, lower leg 09/22/2013  . Neck pain, bilateral 12/18/2012  . Headache(784.0) 12/18/2012  . Binocular visual disturbance 12/18/2012  . Epiretinal membrane 12/17/2012  . Status post intraocular lens implant 12/17/2012  . Preventative health care 12/02/2012  . Low back pain 12/02/2012  . Impaired glucose tolerance 12/02/2012  . Palpitations 10/09/2010  . B12 deficiency 08/30/2010  . Hearing loss 08/30/2010  . Tinnitus 08/30/2010  . Neurogenic orthostatic hypotension ([place]) 06/28/2009  . Mixed hyperlipidemia 05/15/2009  . CHEST PAIN UNSPECIFIED 11/23/2008  . Hypotension, unspecified 10/12/2008  . DIZZINESS 10/12/2008  . Shortness of breath 09/14/2008  . UNSPECIFIED ANEMIA 09/13/2008  . ANXIETY DEPRESSION 09/13/2008  . Old myocardial infarction 09/13/2008  . GASTROESOPHAGEAL REFLUX  DISEASE, HX OF 09/13/2008  . LUMBAR RADICULOPATHY, LEFT 08/23/2008  . MELANOMA, SHOULDER 11/27/2006  . Essential hypertension 11/27/2006  . Coronary atherosclerosis 11/27/2006  . BPH associated with nocturia 11/27/2006  . ERECTILE DYSFUNCTION, ORGANIC 11/27/2006   PCP:  Antony Contras, MD Pharmacy:   [id number] [place], [id number]: [phone]: [address] [address] [id number]: [phone]: [phone]    Social Determinants of Health (SDOH) Interventions    Readmission Risk Interventions No flowsheet data found.

## 2020-04-28 ENCOUNTER — Other Ambulatory Visit: Payer: Self-pay | Admitting: Internal Medicine

## 2020-04-28 DIAGNOSIS — W19XXXA Unspecified fall, initial encounter: Secondary | ICD-10-CM

## 2020-04-28 DIAGNOSIS — I10 Essential (primary) hypertension: Secondary | ICD-10-CM | POA: Diagnosis not present

## 2020-04-28 DIAGNOSIS — S2242XA Multiple fractures of ribs, left side, initial encounter for closed fracture: Secondary | ICD-10-CM | POA: Diagnosis not present

## 2020-04-28 LAB — BASIC METABOLIC PANEL
Anion gap: 8 (ref 5–15)
BUN: 28 mg/dL — ABNORMAL HIGH (ref 8–23)
CO2: 25 mmol/L (ref 22–32)
Calcium: 9 mg/dL (ref 8.9–10.3)
Chloride: 106 mmol/L (ref 98–111)
Creatinine, Ser: 0.81 mg/dL (ref 0.61–1.24)
GFR, Estimated: >60 mL/min (ref >60)
Glucose, Bld: 143 mg/dL — ABNORMAL HIGH (ref 70–99)
Potassium: 4.7 mmol/L (ref 3.5–5.1)
Sodium: 139 mmol/L (ref 135–145)

## 2020-04-28 LAB — CBC
HCT: 36.8 % — ABNORMAL LOW (ref 39.0–52.0)
Hemoglobin: 12.2 g/dL — ABNORMAL LOW (ref 13.0–17.0)
MCH: 29.6 pg (ref 26.0–34.0)
MCHC: 33.2 g/dL (ref 30.0–36.0)
MCV: 89.3 fL (ref 80.0–100.0)
Platelets: 185 10*3/uL (ref 150–400)
RBC: 4.12 MIL/uL — ABNORMAL LOW (ref 4.22–5.81)
RDW: 13.8 % (ref 11.5–15.5)
WBC: 10.5 10*3/uL (ref 4.0–10.5)
nRBC: 0 % (ref 0.0–0.2)

## 2020-04-28 MED ORDER — ISOSORBIDE MONONITRATE ER 30 MG PO TB24: 30.0000 mg | ORAL_TABLET | Freq: Every day | ORAL | 3 refills | Status: DC

## 2020-04-28 MED ORDER — GABAPENTIN 300 MG PO CAPS: 300.0000 mg | ORAL_CAPSULE | Freq: Every day | ORAL | 0 refills | Status: AC

## 2020-04-28 MED ORDER — ONDANSETRON 4 MG PO TBDP: 4.0000 mg | ORAL_TABLET | Freq: Three times a day (TID) | ORAL | 0 refills | Status: AC | PRN

## 2020-04-28 MED ORDER — TRAMADOL HCL 50 MG PO TABS: 50.0000 mg | ORAL_TABLET | Freq: Four times a day (QID) | ORAL | 0 refills | Status: DC | PRN

## 2020-04-28 MED ORDER — ISOSORBIDE MONONITRATE ER 30 MG PO TB24: 30.0000 mg | ORAL_TABLET | Freq: Every day | ORAL | 3 refills | Status: AC

## 2020-04-28 MED ORDER — LIDOCAINE 5 % EX PTCH: 1.0000 | MEDICATED_PATCH | CUTANEOUS | 0 refills | Status: DC

## 2020-04-28 MED ORDER — TIZANIDINE HCL 2 MG PO TABS: 2.0000 mg | ORAL_TABLET | ORAL | 0 refills | Status: DC

## 2020-04-28 NOTE — Discharge Summary (Signed)
Physician Discharge Summary   Patient ID: Troy Combs MRN: [id number] DOB/AGE: [birth year] 86 y.o.  Admit date: 04/26/2020 Discharge date: 04/28/2020  Primary Care Physician:  Antony Contras, MD   Recommendations for Outpatient Follow-up:  1. Follow up with PCP in 1-2 weeks 2. Patient recommended to continue incentive spirometry, tramadol for pain, Lidoderm patch  Home Health: Home health PT OT, home health aide Equipment/Devices: DME home O2 2L  SATURATION QUALIFICATIONS: (This note is used to comply with regulatory documentation for home oxygen)  Patient Saturations on Room Air at Rest = 92%  Patient Saturations on Room Air while Ambulating = 85-92%  Patient Saturations on 2 Liters of oxygen while Ambulating = 92%  Discharge Condition: stable  CODE STATUS: FULL  Diet recommendation: Heart healthy diet   Discharge Diagnoses:    Mechanical fall Multiple closed fractures of the ribs of left side 7-9 Mild acute respiratory failure with hypoxia likely due to splinting Intractable pain . Essential hypertension   Consults trauma surgery    Allergies:   Allergies  Allergen Reactions  . Iohexol Other (See Comments)     Code: HIVES, Desc: PATENT STATES HE IS ALLERGIC TO IV DYE 09/14/08/RM, Onset Date: [id number]   . Ciprofloxacin Hives and Rash    "Patient unsure" just know he has a reaction to med Medical record indicates allergic reaction  . Other Other (See Comments)    UNSPECIFIED REACTION  Angioplasty dye pt.>> Pt only recalls Had "very bad reaction"     DISCHARGE MEDICATIONS: Allergies as of 04/28/2020      Reactions   Iohexol Other (See Comments)    Code: HIVES, Desc: PATENT STATES HE IS ALLERGIC TO IV DYE 09/14/08/RM, Onset Date: [id number]   Ciprofloxacin Hives, Rash   "Patient unsure" just know he has a reaction to med Medical record indicates allergic reaction   Other Other (See Comments)   UNSPECIFIED REACTION  Angioplasty dye pt.>> Pt only  recalls Had "very bad reaction"      Medication List    STOP taking these medications   lisinopril 5 MG tablet Commonly known as: ZESTRIL   Metoprolol Succinate 25 MG Cs24     TAKE these medications   aspirin EC 81 MG tablet Take 81 mg by mouth daily after supper.   clotrimazole 1 % cream Commonly known as: LOTRIMIN Apply 1 application topically 2 (two) times daily. Groin rash   esomeprazole 20 MG capsule Commonly known as: NEXIUM Take 20 mg by mouth daily at 12 noon.   finasteride 5 MG tablet Commonly known as: PROSCAR Take 1 tablet (5 mg total) by mouth daily after supper.   gabapentin 300 MG capsule Commonly known as: NEURONTIN Take 1 capsule (300 mg total) by mouth daily after supper.   hydrALAZINE 25 MG tablet Commonly known as: APRESOLINE Take 25 mg by mouth 2 (two) times daily as needed (high bp above 180).   isosorbide mononitrate 30 MG 24 hr tablet Commonly known as: IMDUR Take 1 tablet (30 mg total) by mouth daily.   lidocaine 5 % Commonly known as: LIDODERM Place 1 patch onto the skin daily. Remove & Discard patch within 12 hours or as directed by MD Start taking on: April 29, 2020   meclizine 12.5 MG tablet Commonly known as: ANTIVERT Take 12.5 mg by mouth 3 (three) times daily as needed for dizziness.   midodrine 10 MG tablet Commonly known as: PROAMATINE TAKE 1 TABLET BY MOUTH THREE TIMES A DAY   nitroGLYCERIN 0.4  MG SL tablet Commonly known as: NITROSTAT Place 1 tablet (0.4 mg total) under the tongue every 5 (five) minutes as needed for chest pain (x 3 doses). Reported on 06/15/2015   NON FORMULARY DIET :Regular NAS   ondansetron 4 MG disintegrating tablet Commonly known as: Zofran ODT Take 1 tablet (4 mg total) by mouth every 8 (eight) hours as needed for nausea or vomiting.   rosuvastatin 10 MG tablet Commonly known as: CRESTOR Take 1 tablet (10 mg total) by mouth daily.   sertraline 100 MG tablet Commonly known as: ZOLOFT Take 1  tablet (100 mg total) by mouth daily.   tamsulosin 0.4 MG Caps capsule Commonly known as: Flomax Take 1 capsule (0.4 mg total) by mouth daily after supper.   tiZANidine 2 MG tablet Commonly known as: ZANAFLEX Take 1 tablet (2 mg total) by mouth See admin instructions. Take one tablet (2 mg) by mouth every morning, take one tablet (2 mg) later in the day as needed for pain   traMADol 50 MG tablet Commonly known as: ULTRAM Take 1 tablet (50 mg total) by mouth every 6 (six) hours as needed for moderate pain or severe pain. What changed:   how much to take  when to take this  reasons to take this  additional instructions   vitamin B-12 1000 MCG tablet Commonly known as: CYANOCOBALAMIN Take 1,000 mcg by mouth daily.            Durable Medical Equipment  (From admission, onward)         Start     Ordered   04/28/20 1208  For home use only DME oxygen  Once       Question Answer Comment  Length of Need 12 Months   Mode or (Route) Nasal cannula   Liters per Minute 2   Frequency Continuous (stationary and portable oxygen unit needed)   Oxygen delivery system Gas      04/28/20 1207           Brief H and P: For complete details please refer to admission H and P, but in brief  Troy JACUINDE is a 86 y.o. male with medical history significant forhypertension, chronic back pain, GERD, previous CABG, previous liver laceration who presents by EMS for evaluation of left-sided rib pain after stumbling last night and hitting the side of a recliner. States he normally walks with a walker and somehow lost his balance and hit his left side on an upolstered arm of chair on night of 12/28. He states he called his PCP who called out a pain medication for him but it did not help. He does not remember the name of the medication. He has severe pain with breathing and movement.He was given oxycodone at ER initially which made him vomit. He denies hitting his head or losing  consciousness. Denies any neck pain. His back pain is chronic and unchanged. Denies any blood thinner use. Denies any pain in arms or legs and denies any numbness in extremities. Denies any history of asthma or COPD.  ED Course: Found to have fracture of left 7-9 ribs which are nondisplaced. Required IV pain medication for comfort.    Hospital Course:     Multiple closed fractures of ribs of left side secondary to mechanical fall -Presented with intractable pleuritic chest pain from rib fracture , found to have fracture of 7-9th ribs on the left, no pneumothorax noted on imaging studies -Pain is now improving with Tylenol, tramadol, Lidoderm  patch -PT was consulted, recommended home health PT, patient declined SNF -Patient recommended to continue incentive spirometry -Home O2 evaluation done, recommended 2 L home O2      Essential hypertension -BP has been soft, hold ACE inhibitor, beta-blocker.  Continue Imdur, follow-up outpatient with PCP and adjust medications  Mechanical fall -PT evaluation recommended home health PT  Mild acute respiratory failure with hypoxia -Likely secondary to rib fractures and intractable pain causing splinting -Home O2 evaluation done, qualified for 2 L O2 with ambulation   Day of Discharge S: No acute complaints, pain on the left chest due to rib fractures, controlled.  Wife at bedside.  Hoping to go home today  BP 100/65 (BP Location: Right Arm)   Pulse 66   Temp 97.6 F (36.4 C)   Resp 16   Ht 5\' 9"  (1.753 m)   Wt 80.6 kg   SpO2 95%   BMI 26.24 kg/m   Physical Exam: General: Alert and awake oriented x3 not in any acute distress. HEENT: anicteric sclera, pupils reactive to light and accommodation CVS: S1-S2 clear no murmur rubs or gallops Chest: clear to auscultation bilaterally, no wheezing rales or rhonchi Abdomen: soft nontender, nondistended, normal bowel sounds Extremities: no cyanosis, clubbing or edema noted  bilaterally Neuro: Cranial nerves II-XII intact, no focal neurological deficits    Get Medicines reviewed and adjusted: Please take all your medications with you for your next visit with your Primary MD  Please request your Primary MD to go over all hospital tests and procedure/radiological results at the follow up. Please ask your Primary MD to get all Hospital records sent to his/her office.  If you experience worsening of your admission symptoms, develop shortness of breath, life threatening emergency, suicidal or homicidal thoughts you must seek medical attention immediately by calling 911 or calling your MD immediately  if symptoms less severe.  You must read complete instructions/literature along with all the possible adverse reactions/side effects for all the Medicines you take and that have been prescribed to you. Take any new Medicines after you have completely understood and accept all the possible adverse reactions/side effects.   Do not drive when taking pain medications.   Do not take more than prescribed Pain, Sleep and Anxiety Medications  Special Instructions: If you have smoked or chewed Tobacco  in the last 2 yrs please stop smoking, stop any regular Alcohol  and or any Recreational drug use.  Wear Seat belts while driving.  Please note  You were cared for by a hospitalist during your hospital stay. Once you are discharged, your primary care physician will handle any further medical issues. Please note that NO REFILLS for any discharge medications will be authorized once you are discharged, as it is imperative that you return to your primary care physician (or establish a relationship with a primary care physician if you do not have one) for your aftercare needs so that they can reassess your need for medications and monitor your lab values.   The results of significant diagnostics from this hospitalization (including imaging, microbiology, ancillary and laboratory) are  listed below for reference.      Procedures/Studies:  CT ABDOMEN PELVIS WO CONTRAST  Addendum Date: 04/26/2020   ADDENDUM REPORT: 04/26/2020 18:02 ADDENDUM: Nondisplaced fractures of the left anterior seventh, eighth, and ninth ribs are identified. Electronically Signed   By: Lucienne Capers M.D.   On: 04/26/2020 18:02   Result Date: 04/26/2020 CLINICAL DATA:  Patient fell last night striking the  left side. Pain and difficulty taking a breath. Allergic to IV contrast. EXAM: CT CHEST, ABDOMEN AND PELVIS WITHOUT CONTRAST TECHNIQUE: Multidetector CT imaging of the chest, abdomen and pelvis was performed following the standard protocol without IV contrast. COMPARISON:  CT abdomen and pelvis 05/13/2014.  CT chest 09/14/2008 FINDINGS: CT CHEST FINDINGS Cardiovascular: Normal heart size. No pericardial effusions. Normal caliber thoracic aorta with scattered aortic calcification. Postoperative changes in the mediastinum consistent with coronary bypass. Mediastinum/Nodes: No abnormal mediastinal gas or hematoma. Thyroid gland is unremarkable. No significant lymphadenopathy. Esophagus is decompressed. Lungs/Pleura: Mild peripheral bronchiectasis in the bases. Linear atelectasis versus fibrosis in the lung bases. No airspace disease or consolidation. No pleural effusion. No pneumothorax. Airways are clear. Musculoskeletal: Mild degenerative changes in the spine. No vertebral compression deformities. Sternotomy wires. No depressed sternal fractures. The ribs appear intact. No displaced rib fractures identified. CT ABDOMEN PELVIS FINDINGS Hepatobiliary: Chronic calcification along the liver edge, unchanged. Gallbladder appears surgically absent. No bile duct dilatation. Pancreas: Unremarkable. No pancreatic ductal dilatation or surrounding inflammatory changes. Spleen: Normal in size without focal abnormality. Adrenals/Urinary Tract: Right renal cyst. Adrenal glands, kidneys, ureters, and bladder are otherwise  unremarkable. Stomach/Bowel: Stomach, small bowel, and colon are mostly decompressed with scattered stool in the colon. Scattered colonic diverticula. No inflammatory changes. Appendix is normal. Vascular/Lymphatic: Aortic atherosclerosis. No enlarged abdominal or pelvic lymph nodes. Reproductive: Uterus and bilateral adnexa are unremarkable. Other: No abdominal wall hernia or abnormality. No abdominopelvic ascites. Musculoskeletal: Normal alignment of the lumbar spine. Degenerative changes. No vertebral compression deformities. Sacrum, pelvis, and hips appear intact. No displaced fractures identified. IMPRESSION: No acute posttraumatic changes demonstrated in the chest, abdomen, or pelvis. No evidence of mediastinal or pulmonary parenchymal injury. No evidence of solid organ injury or bowel perforation. Aortic Atherosclerosis (ICD10-I70.0). Electronically Signed: By: Lucienne Capers M.D. On: 04/26/2020 17:45   DG Ribs Unilateral W/Chest Left  Result Date: 04/26/2020 CLINICAL DATA:  Left rib pain after fall last night. EXAM: LEFT RIBS AND CHEST - 3+ VIEW COMPARISON:  None. FINDINGS: Mildly displaced fractures are seen involving the lateral portions of the left eighth, ninth and tenth ribs. Mild bibasilar atelectasis is noted. No pneumothorax is noted. Small pleural effusions may be present. IMPRESSION: Mildly displaced left eighth, ninth and tenth rib fractures. Electronically Signed   By: Marijo Conception M.D.   On: 04/26/2020 14:26   CT Head Wo Contrast  Result Date: 04/26/2020 CLINICAL DATA:  86 year old post fall last night hitting left side. EXAM: CT HEAD WITHOUT CONTRAST TECHNIQUE: Contiguous axial images were obtained from the base of the skull through the vertex without intravenous contrast. COMPARISON:  Most recent head CT 01/10/2015. Brain MRI 08/12/2019 also reviewed. FINDINGS: Brain: Generalized atrophy with advanced chronic small vessel ischemia. No intracranial hemorrhage, mass effect, or  midline shift. No hydrocephalus. The basilar cisterns are patent. No evidence of territorial infarct or acute ischemia. No extra-axial or intracranial fluid collection. Vascular: Atherosclerosis of skullbase vasculature without hyperdense vessel or abnormal calcification. Skull: No fracture or focal lesion. Sinuses/Orbits: Paranasal sinuses and mastoid air cells are clear. The visualized orbits are unremarkable. Bilateral cataract resection Other: None. IMPRESSION: 1. No acute intracranial abnormality. No skull fracture. 2. Generalized atrophy and advanced chronic small vessel ischemia. Electronically Signed   By: Keith Rake M.D.   On: 04/26/2020 17:40   CT Chest Wo Contrast  Addendum Date: 04/26/2020   ADDENDUM REPORT: 04/26/2020 18:02 ADDENDUM: Nondisplaced fractures of the left anterior seventh, eighth, and ninth ribs are  identified. Electronically Signed   By: Lucienne Capers M.D.   On: 04/26/2020 18:02   Result Date: 04/26/2020 CLINICAL DATA:  Patient fell last night striking the left side. Pain and difficulty taking a breath. Allergic to IV contrast. EXAM: CT CHEST, ABDOMEN AND PELVIS WITHOUT CONTRAST TECHNIQUE: Multidetector CT imaging of the chest, abdomen and pelvis was performed following the standard protocol without IV contrast. COMPARISON:  CT abdomen and pelvis 05/13/2014.  CT chest 09/14/2008 FINDINGS: CT CHEST FINDINGS Cardiovascular: Normal heart size. No pericardial effusions. Normal caliber thoracic aorta with scattered aortic calcification. Postoperative changes in the mediastinum consistent with coronary bypass. Mediastinum/Nodes: No abnormal mediastinal gas or hematoma. Thyroid gland is unremarkable. No significant lymphadenopathy. Esophagus is decompressed. Lungs/Pleura: Mild peripheral bronchiectasis in the bases. Linear atelectasis versus fibrosis in the lung bases. No airspace disease or consolidation. No pleural effusion. No pneumothorax. Airways are clear. Musculoskeletal:  Mild degenerative changes in the spine. No vertebral compression deformities. Sternotomy wires. No depressed sternal fractures. The ribs appear intact. No displaced rib fractures identified. CT ABDOMEN PELVIS FINDINGS Hepatobiliary: Chronic calcification along the liver edge, unchanged. Gallbladder appears surgically absent. No bile duct dilatation. Pancreas: Unremarkable. No pancreatic ductal dilatation or surrounding inflammatory changes. Spleen: Normal in size without focal abnormality. Adrenals/Urinary Tract: Right renal cyst. Adrenal glands, kidneys, ureters, and bladder are otherwise unremarkable. Stomach/Bowel: Stomach, small bowel, and colon are mostly decompressed with scattered stool in the colon. Scattered colonic diverticula. No inflammatory changes. Appendix is normal. Vascular/Lymphatic: Aortic atherosclerosis. No enlarged abdominal or pelvic lymph nodes. Reproductive: Uterus and bilateral adnexa are unremarkable. Other: No abdominal wall hernia or abnormality. No abdominopelvic ascites. Musculoskeletal: Normal alignment of the lumbar spine. Degenerative changes. No vertebral compression deformities. Sacrum, pelvis, and hips appear intact. No displaced fractures identified. IMPRESSION: No acute posttraumatic changes demonstrated in the chest, abdomen, or pelvis. No evidence of mediastinal or pulmonary parenchymal injury. No evidence of solid organ injury or bowel perforation. Aortic Atherosclerosis (ICD10-I70.0). Electronically Signed: By: Lucienne Capers M.D. On: 04/26/2020 17:45   CT Cervical Spine Wo Contrast  Result Date: 04/26/2020 CLINICAL DATA:  Fall last night striking left side. EXAM: CT CERVICAL SPINE WITHOUT CONTRAST TECHNIQUE: Multidetector CT imaging of the cervical spine was performed without intravenous contrast. Multiplanar CT image reconstructions were also generated. COMPARISON:  None. FINDINGS: Alignment: Straightening of normal lordosis. No traumatic subluxation. Skull base  and vertebrae: No acute fracture. Vertebral body heights are maintained. The dens and skull base are intact. Soft tissues and spinal canal: No prevertebral fluid or swelling. No visible canal hematoma. Disc levels: Multilevel degenerative disc disease most prominent at C6-C7 with near complete disc space loss. There is multilevel facet hypertrophy. No high-grade canal stenosis. Upper chest: Assessed on concurrent chest CT, reported separately. Other: None. IMPRESSION: Degenerative change in the cervical spine without acute fracture or subluxation. Electronically Signed   By: Keith Rake M.D.   On: 04/26/2020 17:42      LAB RESULTS: Basic Metabolic Panel: Recent Labs  Lab 04/26/20 1811 04/27/20 0732 04/28/20 0459  NA 135  --  139  K 4.0  --  4.7  CL 105  --  106  CO2 19*  --  25  GLUCOSE 167*  --  143*  BUN 23  --  28*  CREATININE 0.75 0.77 0.81  CALCIUM 9.0  --  9.0   Liver Function Tests: No results for input(s): AST, ALT, ALKPHOS, BILITOT, PROT, ALBUMIN in the last 168 hours. No results for input(s): LIPASE, AMYLASE  in the last 168 hours. No results for input(s): AMMONIA in the last 168 hours. CBC: Recent Labs  Lab 04/26/20 1717 04/27/20 0732 04/28/20 0459  WBC 14.2* 12.8* 10.5  NEUTROABS 11.8*  --   --   HGB 14.9 13.7 12.2*  HCT 43.0 40.9 36.8*  MCV 86.7 88.9 89.3  PLT 167 183 185   Cardiac Enzymes: No results for input(s): CKTOTAL, CKMB, CKMBINDEX, TROPONINI in the last 168 hours. BNP: Invalid input(s): POCBNP CBG: No results for input(s): GLUCAP in the last 168 hours.     Disposition and Follow-up: Discharge Instructions    Diet - low sodium heart healthy   Complete by: As directed    Increase activity slowly   Complete by: As directed        DISPOSITION: Home with home health PT OT, home health aide, DME home O2  DISCHARGE FOLLOW-UP  Follow-up Information    Tally Joe, MD. Schedule an appointment as soon as possible for a visit in 2  week(s).   Specialty: Family Medicine Contact information: [phone] [address] [place] [address] [phone]        Lars Masson, MD .   Specialty: Cardiology Contact information: [address] [place] [address] [phone]                Time coordinating discharge:  35 minutes  Signed:   Thad Ranger M.D. [facility] 04/28/2020, 1:07 PM

## 2020-04-28 NOTE — TOC Transition Note (Addendum)
Transition of Care [facility]) - CM/SW Discharge Note   Patient Details  Name: Troy Combs MRN: [id number] Date of Birth: Jun 12, 1931  [facility]) CM/SW Contact:  Lia Hopping, [place] Phone Number: 04/28/2020, 12:59 PM   Clinical Narrative:    CSW met with the patient and her spouse at bedside. Patient spouse unable to get patient into their home alone. She has requested [facility] transport. Correct Home address:  [address] [place], [place] [address] CSW provided list Home Health agencies. Spouse reports they have uses [facility] in the past however she is open to other agencies if staff is not available due to the holiday.  CSW reached out next choice [facility] ([facility]) confirm staff availability for Sunday or Monday.  Oxygen will be delivered to the patient home between 2:30pm and 4:00PM today. [facility] arranged to pick the patient up at 4:30pm. Spouse has went home to wait for the delivery.    4:00PM Addendum-CSW reached out spouse. The oxygen has not been delivered yet however [facility] confirmed it is on the truck to be delivered. CSW informed spouse to call CSW if the oxygen has not been delivered by 5:30.  [facility] delayed-by several hours.  Spouse and patient aware.    Final next level of care: [place] Barriers to Discharge: Barriers Resolved   Patient Goals and CMS Choice Patient states their goals for this hospitalization and ongoing recovery are:: return home with home health CMS Medicare.gov Compare Post Acute Care list provided to:: Patient Choice offered to / list presented to : Patient  Discharge Placement                       Discharge Plan and Services In-house Referral: Clinical Social Work Discharge Planning Services: CM Consult Post Acute Care Choice: Home Health          DME Arranged: Oxygen DME Agency: [facility] Date DME Agency Contacted: 04/28/20 Time DME Agency Contacted: 1104 Representative spoke with at DME  Agency: [place]: PT,OT [place] Agency: [place] ([place]) Date [place]: 04/28/20 Time [place]: 67 Representative spoke with at [place]: [place]  Social Determinants of Health (SDOH) Interventions     Readmission Risk Interventions No flowsheet data found.

## 2020-04-28 NOTE — Progress Notes (Signed)
SATURATION QUALIFICATIONS: (This note is used to comply with regulatory documentation for home oxygen)  Patient Saturations on Room Air at Rest = 92%  Patient Saturations on Room Air while Ambulating = 85-92%  Patient Saturations on 2 Liters of oxygen while Ambulating = 92%  Please briefly explain why patient needs home oxygen:Oxygen Therapy

## 2020-04-28 NOTE — Progress Notes (Signed)
BP is low. MD notified. Pt asymptomatic. As per MD order AM BP meds were held.

## 2020-04-28 NOTE — Evaluation (Addendum)
Occupational Therapy Evaluation Patient Details Name: Troy Combs MRN: [id number] DOB: Mar 13, 1932 Today's Date: 04/28/2020    History of Present Illness "Patient admitted after a mechanical fall at home.  He denies any syncopal episode or any presyncopal symptoms.  Found to have fracture of the 8th, 9th and 10th rib on the left.  No pneumothorax noted on imaging studies.  Discussed with trauma surgery who recommend incentive spirometry mobilization pain control."   Clinical Impression   [name] is an 86 year old man s/p fall at home who presents with left sided rib fractures and pain. On evaluation patient mod assist for transfer into sitting, min assist for initial stand, min guard for ambulating to bathroom and toileting and mod assist for lower body dressing. Patient taken off of [place] but o2 sat dropped to 87- 88% on RA. Replaced [place] and o2 sats maintained in mid 90s. Patient and wife educated on transfer techniques and compensatory strategies for home tasks including use of LHR for dressing and reaching items on floor, use of shower chair for safety, use of recliner for sleeping to limit difficulty with transfers, and sit to stand techniques. Therapist also warned patient about physical responses ( i.e. jerking movement) to pain resulting in a loss of balance when standing with walker. Therapist and spouse verbalized understanding of all education and instruction. Spouse will be assisting patient with bathing and dressing at home and will have daughter to assist as well. Therapist encouraged mobility and use of incentive spirometer at home. No further OT needs at this time.    Follow Up Recommendations  No OT follow up    Equipment Recommendations  None recommended by OT    Recommendations for Other Services       Precautions / Restrictions Precautions Precautions: Fall Precaution Comments: monitor sats Restrictions Weight Bearing Restrictions: No      Mobility Bed  Mobility Overal bed mobility: Needs Assistance Bed Mobility: Rolling;Sidelying to Sit Rolling: Supervision Sidelying to sit: Mod assist;HOB elevated       General bed mobility comments: Mod assist for trunk lift secondary to pain.    Transfers Overall transfer level: Needs assistance Equipment used: Rolling walker (2 wheeled) Transfers: Sit to/from Stand Sit to Stand: Min assist;Min guard         General transfer comment: Initial stand min assist but performed 2 other sit to stands from recliner and toilet with min guard and verbal cues for improved technique.    Balance Overall balance assessment: History of Falls                                         ADL either performed or assessed with clinical judgement   ADL Overall ADL's : Needs assistance/impaired Eating/Feeding: Independent   Grooming: Wash/dry hands;Min guard;Standing   Upper Body Bathing: Set up;Sitting   Lower Body Bathing: Minimal assistance;Sit to/from stand   Upper Body Dressing : Set up;Sitting   Lower Body Dressing: Moderate assistance;Sit to/from stand   Toilet Transfer: Min guard;RW;Ambulation;Grab Pharmacist, community and Hygiene: Min guard;Sit to/from stand       Functional mobility during ADLs: Min guard;Rolling walker       Vision Patient Visual Report: No change from baseline       Perception     Praxis      Pertinent Vitals/Pain Pain Assessment: Faces Faces Pain  Scale: Hurts little more Pain Location: left flank Pain Descriptors / Indicators: Grimacing;Guarding;Sore;Sharp Pain Intervention(s): Monitored during session;Patient requesting pain meds-RN notified     Hand Dominance     Extremity/Trunk Assessment Upper Extremity Assessment Upper Extremity Assessment: Overall WFL for tasks assessed   Lower Extremity Assessment Lower Extremity Assessment: Defer to PT evaluation   Cervical / Trunk Assessment Cervical /  Trunk Assessment: Normal   Communication Communication Communication: No difficulties   Cognition Arousal/Alertness: Awake/alert Behavior During Therapy: WFL for tasks assessed/performed Overall Cognitive Status: Within Functional Limits for tasks assessed                                     General Comments       Exercises     Shoulder Instructions      Home Living Family/patient expects to be discharged to:: Private residence Living Arrangements: Spouse/significant other Available Help at Discharge: Family;Personal care attendant;Available PRN/intermittently Type of Home: House Home Access: Level entry     Home Layout: One level     Bathroom Shower/Tub: Occupational psychologist: Handicapped height     Home Equipment: Shower seat - built in;Hand held shower head;Grab bars - tub/shower;Walker - 2 wheels;Transport chair          Prior Functioning/Environment Level of Independence: Independent with assistive device(s)        Comments: uses RW for ambulation, reports SOB and fatigue with bathing the last few months (has seen a pulmonologist per pt)        OT Problem List: Pain;Decreased activity tolerance      OT Treatment/Interventions:      OT Goals(Current goals can be found in the care plan section) Acute Rehab OT Goals OT Goal Formulation: All assessment and education complete, DC therapy  OT Frequency:     Barriers to D/C:            Co-evaluation              AM-PAC OT "6 Clicks" Daily Activity     Outcome Measure Help from another person eating meals?: None Help from another person taking care of personal grooming?: A Little Help from another person toileting, which includes using toliet, bedpan, or urinal?: A Little Help from another person bathing (including washing, rinsing, drying)?: A Little Help from another person to put on and taking off regular upper body clothing?: A Little Help from another person to  put on and taking off regular lower body clothing?: A Lot 6 Click Score: 18   End of Session Equipment Utilized During Treatment: Gait belt;Rolling walker;Oxygen Nurse Communication: Mobility status  Activity Tolerance: Patient limited by pain Patient left: in chair;with call bell/phone within reach;with chair alarm set;with family/visitor present  OT Visit Diagnosis: History of falling (Z91.81);Pain Pain - Right/Left: Left Pain - part of body:  (flank)                Time: [id number] OT Time Calculation (min): 23 min Charges:  OT General Charges $OT Visit: 1 Visit OT Evaluation $OT Eval Low Complexity: 1 Low OT Treatments $Self Care/Home Management : 8-22 mins  Wilkie Zenon, OTR/L [place] [phone] Pager: [phone]   Lenward Chancellor 04/28/2020, 1:47 PM

## 2020-04-28 NOTE — Progress Notes (Signed)
Patient is supposed to be discharged today. Wife will be leaving soon. She would like to be called and gone over the discharge instructions with the nurse. If Darl Pikes can not be reached patient and his wife Darl Pikes would like the discharging nurse to please contact their daughter Harriett Sine and go through the discharge instructions with her.  Both Darl Pikes and Nancy's phone numbers are listed in the [facility]-Admissions TAB

## 2020-05-01 ENCOUNTER — Encounter: Payer: Self-pay | Admitting: Physician Assistant

## 2020-05-01 NOTE — Progress Notes (Deleted)
Cardiology Office Note    Date:  05/01/2020   ID:  Troy Combs, DOB [birth year], MRN [id number]  PCP:  Antony Contras, MD  Cardiologist:  Ena Dawley, MD  Electrophysiologist:  Virl Axe, MD   Chief Complaint: pre-op, also 3 month follow-up  History of Present Illness:   Troy Combs is a 87 y.o. male with history of CAD s/p CABG x4V (2002), HTN, atrial flutter s/p remote RFCA, known orthostatic hypotension, symptomatic PVCs treated with beta blocker, BPPV, allergic rhinitis, anemia, arthritis, diminished hearing, dysthymic disorder, GERD, lumbar radiculopathy who presents for follow-up.   Per chart review he has a history of PVCs managed with beta blocker with limitation in the past due to hypotension. Event monitor 2017 had shown 16,700 PVCs in 48 hours, some couplets, infrequent PACs. Last ischemic eval in 2018 showed native CAD with patent grafts, dyspnea not felt related to CAD. Carotid duplex 07/2019 1-39% BICA. 2D echo 07/2019 EF 60-65%, grade 2 DD, mildly enlarged RV with mildly elevated PASP, mild LAE. He also previously followed with a physician in [place] for autonomic dysfunction. He also remotely saw Dr. Caryl Combs for this. At one point he was on midodrine, Mestinon and Northera but more recently has just been on midodrine. At last f/u with me in October his major complaint was continued chronic DOE even with ADLs. This is the symptom that prompted cath back in 2018, progressing since that time. In general he is very sedentary due to his other health issues and this was very frustrating to him. He walks with a walker apparatus that has arm handles as well. We switched lisinopril to Imdur in case of microvascular angina. He was recently admitted 12/29-12/31 for mechanical fall and required home O2 at dc likely secondary to rib fractures and intractable pain causing splinting. He is now referred back for preop clearance for lumbar fusion.  O2 for pcp ekg please  Preop  cardiovascular examination Chronic dyspnea likely multifactorial, also occurring in context of CAD s/p prior CABG  Orthostatic hypotension Frequent PVCs Remote atrial flutter   Labwork independently reviewed: 03/2020 Hgb 12.2, K 4.7, Cr 0.81, BUN 28, Covid neg 09/2019 KPN LDL 47, Tchol 116, trig 185 07/2019 K 4.2, Cr 0.6, TSH wnl, A1C 6.4, Hgb 14.8, plt 149   Past Medical History:  Diagnosis Date  . Allergic rhinitis 09/20/2014  . Anemia, unspecified   . Arthritis   . Atrial flutter ([place])    a. s/p RFCA remotely.  . Autonomic dysfunction   . Bladder neck obstruction   . Cataracts, bilateral   . Coronary atherosclerosis of unspecified type of vessel, native or graft    CABG 2002  . Diminished hearing, bilateral   . Dysthymic disorder   . GERD (gastroesophageal reflux disease)   . Heart murmur    as a child  . Impaired glucose tolerance 12/02/2012  . Impotence of organic origin   . Lumbar radiculopathy   . Malignant melanoma of skin of upper limb, including shoulder ([place])   . Old myocardial infarction   . Personal history of malignant melanoma of skin   . Personal history of other diseases of digestive system   . PVC's (premature ventricular contractions)   . Thoracic or lumbosacral neuritis or radiculitis, unspecified   . Unspecified essential hypertension     Past Surgical History:  Procedure Laterality Date  . ARTHRODESIS  07/06/2002   of left long finger distal interphalangeal joint with Kirschner wire fixation X 3  .  ATRIAL ABLATION SURGERY  05/04/2001   Dr. Lewayne Bunting  . CARDIAC CATHETERIZATION  09/05/2008   Revealing 4 of 4 patent grafts with native multivessel coronary artery disease, EF  of 60% without regional wall motion abnormalities.  Marland Kitchen CATARACT EXTRACTION, BILATERAL    . CHOLECYSTECTOMY    . COLONOSCOPY    . CORONARY ANGIOPLASTY  1993  . CORONARY ARTERY BYPASS GRAFT  10/17/2000   Troy Combs. Tyrone Sage, MD  . EXPLORATORY LAPAROTOMY     AFTER LIVER TRAUMA  AND HAND SURGERY  . EYE SURGERY    . HERNIA REPAIR     BILATERAL  . LEFT HEART CATH AND CORS/GRAFTS ANGIOGRAPHY N/A 04/03/2017   Procedure: LEFT HEART CATH AND CORS/GRAFTS ANGIOGRAPHY;  Surgeon: Troy Lex, MD;  Location: [facility] INVASIVE CV LAB;  Service: Cardiovascular;  Laterality: N/A;  . LUMBAR LAMINECTOMY/DECOMPRESSION MICRODISCECTOMY N/A 02/02/2016   Procedure: MICRODISCECTOMY LUMBAR FOUR- LUMBAR FIVE;  Surgeon: Troy Memos, MD;  Location: [facility] OR;  Service: Neurosurgery;  Laterality: N/A;  MICRODISCECTOMY L4-L5  . LUMBAR LAMINECTOMY/DECOMPRESSION MICRODISCECTOMY Right 08/02/2016   Procedure: MICRODISCECTOMY LUMBAR FOUR- LUMBAR FIVE RIGHT;  Surgeon: Troy Memos, MD;  Location: [facility] OR;  Service: Neurosurgery;  Laterality: Right;  . punctured eardrum      to relieve blood build up   for ear infection  . TONSILLECTOMY      Current Medications: No outpatient medications have been marked as taking for the 05/04/20 encounter (Appointment) with Troy Montana, PA-C.   ***   Allergies:   Iohexol, Ciprofloxacin, and Other   Social History   Socioeconomic History  . Marital status: Married    Spouse name: Not on file  . Number of children: 5  . Years of education: Not on file  . Highest education level: Not on file  Occupational History  . Occupation: retired  Tobacco Use  . Smoking status: Former Smoker    Packs/day: 1.00    Years: 3.00    Pack years: 3.00    Types: Cigarettes    Quit date: 09/21/1955    Years since quitting: 64.6  . Smokeless tobacco: Never Used  Vaping Use  . Vaping Use: Never used  Substance and Sexual Activity  . Alcohol use: Yes    Alcohol/week: 0.0 standard drinks    Comment: one or two ounces a week  . Drug use: No  . Sexual activity: Not on file  Other Topics Concern  . Not on file  Social History Narrative   Lives with wife in a one story home.     Has 5 children.  (2 are deceased now) Retired Production designer, theatre/television/film for [employer].     Education: 2 years of college.     Social Determinants of Health   Financial Resource Strain: Not on file  Food Insecurity: Not on file  Transportation Needs: Not on file  Physical Activity: Not on file  Stress: Not on file  Social Connections: Not on file     Family History:  The patient's ***family history includes Heart disease in his father. There is no history of Ataxia, Chorea, Dementia, Mental retardation, Migraines, Multiple sclerosis, Neurofibromatosis, Neuropathy, Parkinsonism, Seizures, or Stroke.  ROS:   Please see the history of present illness. Otherwise, review of systems is positive for ***.  All other systems are reviewed and otherwise negative.    EKGs/Labs/Other Studies Reviewed:    Studies reviewed are outlined and summarized above. Reports included below if pertinent.  2D echo 07/2019  1. Left ventricular ejection  fraction, by estimation, is 60 to 65%. The  left ventricle has normal function. The left ventricle has no regional  wall motion abnormalities. There is mild left ventricular hypertrophy.  Left ventricular diastolic parameters  are consistent with Grade II diastolic dysfunction (pseudonormalization).  Elevated left ventricular end-diastolic pressure.  2. Right ventricular systolic function is normal. The right ventricular  size is mildly enlarged. There is mildly elevated pulmonary artery  systolic pressure.  3. Left atrial size was mildly dilated.  4. The mitral valve is degenerative. Trivial mitral valve regurgitation.  No evidence of mitral stenosis.  5. The aortic valve is tricuspid. Aortic valve regurgitation is trivial.  No aortic stenosis is present.  6. The inferior vena cava is normal in size with greater than 50%  respiratory variability, suggesting right atrial pressure of 3 mmHg.    [place] 2018  1. Left ventricular ejection fraction, by estimation, is 60 to 65%. The  left ventricle has normal function. The left ventricle has no regional  wall motion  abnormalities. There is mild left ventricular hypertrophy.  Left ventricular diastolic parameters  are consistent with Grade II diastolic dysfunction (pseudonormalization).  Elevated left ventricular end-diastolic pressure.  2. Right ventricular systolic function is normal. The right ventricular  size is mildly enlarged. There is mildly elevated pulmonary artery  systolic pressure.  3. Left atrial size was mildly dilated.  4. The mitral valve is degenerative. Trivial mitral valve regurgitation.  No evidence of mitral stenosis.  5. The aortic valve is tricuspid. Aortic valve regurgitation is trivial.  No aortic stenosis is present.  6. The inferior vena cava is normal in size with greater than 50%  respiratory variability, suggesting right atrial pressure of 3 mmHg.     EKG:  EKG is ordered today, personally reviewed, demonstrating ***  Recent Labs: 08/13/2019: Magnesium 2.0; TSH 2.884 08/15/2019: ALT 23 02/22/2020: NT-Pro BNP 178 04/28/2020: BUN 28; Creatinine, Ser 0.81; Hemoglobin 12.2; Platelets 185; Potassium 4.7; Sodium 139  Recent Lipid Panel    Component Value Date/Time   CHOL 110 01/24/2016 0955   TRIG 133.0 01/24/2016 0955   TRIG 83 03/18/2006 0839   HDL 49.70 01/24/2016 0955   CHOLHDL 2 01/24/2016 0955   VLDL 26.6 01/24/2016 0955   LDLCALC 34 01/24/2016 0955    PHYSICAL EXAM:    VS:  There were no vitals taken for this visit.  BMI: There is no height or weight on file to calculate BMI.  GEN: Well nourished, well developed, in no acute distress HEENT: normocephalic, atraumatic Neck: no JVD, carotid bruits, or masses Cardiac: ***RRR; no murmurs, rubs, or gallops, no edema  Respiratory:  clear to auscultation bilaterally, normal work of breathing GI: soft, nontender, nondistended, + BS MS: no deformity or atrophy Skin: warm and dry, no rash Neuro:  Alert and Oriented x 3, Strength and sensation are intact, follows commands Psych: euthymic mood, full  affect  Wt Readings from Last 3 Encounters:  04/27/20 177 lb 11.1 oz (80.6 kg)  02/22/20 177 lb 9.6 oz (80.6 kg)  10/19/19 174 lb 6.4 oz (79.1 kg)     ASSESSMENT & PLAN:   1. ***  Disposition: F/u with ***   Medication Adjustments/Labs and Tests Ordered: Current medicines are reviewed at length with the patient today.  Concerns regarding medicines are outlined above. Medication changes, Labs and Tests ordered today are summarized above and listed in the Patient Instructions accessible in Encounters.   Signed, Charlie Pitter, PA-C  05/01/2020 10:57 AM  [facility] [place], [place], [place]  [address] Phone: [phone]; Fax: [phone]

## 2020-05-01 NOTE — Progress Notes (Unsigned)
Cardiology Office Note    Date:  05/03/2020   ID:  Troy LoaWilliam R Rydberg, DOB 10/24/1931, MRN [id number]  PCP:  Tally JoeSwayne, David, MD  Cardiologist:  Tobias AlexanderKatarina Nelson, MD  Electrophysiologist:  Sherryl MangesSteven Klein, MD   Chief Complaint: pre-op, also 3 month follow-up  History of Present Illness:   Troy Combs is a 87 y.o. male with history of CAD s/p CABG x4V (2002), HTN, atrial flutter s/p remote RFCA, known orthostatic hypotension, symptomatic PVCs treated with beta blocker, BPPV, allergic rhinitis, anemia, arthritis, diminished hearing, dysthymic disorder, GERD, lumbar radiculopathy who presents for follow-up.   Per chart review he has a history of PVCs managed with beta blocker with limitation in the past due to hypotension. Event monitor 2017 had shown 16,700 PVCs in 48 hours, some couplets, infrequent PACs. Last ischemic eval in 2018 showed native CAD with patent grafts, dyspnea not felt related to CAD. Carotid duplex 07/2019 1-39% BICA. 2D echo 07/2019 EF 60-65%, grade 2 DD, mildly enlarged RV with mildly elevated PASP, mild LAE. He also previously followed with a physician in [place] for autonomic dysfunction. He also remotely saw Dr. Graciela HusbandsKlein for this. At one point he was on midodrine, Mestinon and Northera but more recently has just been on midodrine. At last f/u with me in October his major complaint was continued chronic DOE even with ADLs. This is the symptom that prompted cath back in 2018, progressing since that time. In general he is very sedentary due to his other health issues and this was very frustrating to him. He walks with a walker apparatus that has arm handles as well. We switched lisinopril to Imdur in case of microvascular angina. Conservative approach was pursued given his comorbidities. It appears outpatient palliative care assessment has previously been sent. He was recently admitted 12/29-12/31 for mechanical fall. He tripped over the recliner and originally felt OK. The next day he  felt significant rib pain and required home O2 at dc felt secondary to rib fractures and intractable pain causing splinting. He is now referred back for preop clearance for lumbar fusion, unrelated to his above recent admission.  He has not yet been back to see his primary care doctor - has plans for them later this week. He feels he is improving somewhat but still has pleuritic chest pain with deep inspiration. He feels this is improving but there are times it makes it hard to take a deep breath. His wife states that starting the O2 in the hospital is what helped him the most, because before this he'd have periods of his lips turning blue. He arrived today at 93% on RA, coming up to 97% on 2L. No lower extremity edema, cough, fevers, chills, or anginal pain. The discomfort occurs solely with inspiration. It is hard for him to assess if there is a change in his chronic dyspnea as he has been even more sedentary since the fall. No edema.  Labwork independently reviewed: 03/2020 Hgb 12.2, K 4.7, Cr 0.81, BUN 28, Covid neg 09/2019 KPN LDL 47, Tchol 116, trig 185 07/2019 K 4.2, Cr 0.6, TSH wnl, A1C 6.4, Hgb 14.8, plt 149   Past Medical History:  Diagnosis Date  . Allergic rhinitis 09/20/2014  . Anemia, unspecified   . Arthritis   . Atrial flutter (HCC)    a. s/p RFCA remotely.  . Autonomic dysfunction   . Bladder neck obstruction   . Cataracts, bilateral   . Coronary atherosclerosis of unspecified type of vessel, native or graft  CABG 2002  . Diminished hearing, bilateral   . Dysthymic disorder   . Fall   . GERD (gastroesophageal reflux disease)   . Heart murmur    as a child  . Impaired glucose tolerance 12/02/2012  . Impotence of organic origin   . Lumbar radiculopathy   . Malignant melanoma of skin of upper limb, including shoulder ([place])   . Old myocardial infarction   . Personal history of malignant melanoma of skin   . Personal history of other diseases of digestive system   . PVC's  (premature ventricular contractions)   . Thoracic or lumbosacral neuritis or radiculitis, unspecified   . Unspecified essential hypertension     Past Surgical History:  Procedure Laterality Date  . ARTHRODESIS  07/06/2002   of left long finger distal interphalangeal joint with Kirschner wire fixation X 3  . ATRIAL ABLATION SURGERY  05/04/2001   Dr. [name]  . CARDIAC CATHETERIZATION  09/05/2008   Revealing 4 of 4 patent grafts with native multivessel coronary artery disease, EF  of 60% without regional wall motion abnormalities.  Marland Kitchen CATARACT EXTRACTION, BILATERAL    . CHOLECYSTECTOMY    . COLONOSCOPY    . CORONARY ANGIOPLASTY  1993  . CORONARY ARTERY BYPASS GRAFT  10/17/2000   [name]. [name], [place]     AFTER LIVER TRAUMA AND HAND SURGERY  . EYE SURGERY    . HERNIA REPAIR     BILATERAL  . LEFT HEART CATH AND CORS/GRAFTS ANGIOGRAPHY N/A 04/03/2017   Procedure: LEFT HEART CATH AND CORS/GRAFTS ANGIOGRAPHY;  Surgeon: Leonie Man, MD;  Location: [facility];  Service: Cardiovascular;  Laterality: N/A;  . LUMBAR LAMINECTOMY/DECOMPRESSION MICRODISCECTOMY N/A 02/02/2016   Procedure: MICRODISCECTOMY LUMBAR FOUR- LUMBAR FIVE;  Surgeon: Ashok Pall, MD;  Location: [facility];  Service: Neurosurgery;  Laterality: N/A;  MICRODISCECTOMY L4-L5  . LUMBAR LAMINECTOMY/DECOMPRESSION MICRODISCECTOMY Right 08/02/2016   Procedure: MICRODISCECTOMY LUMBAR FOUR- LUMBAR FIVE RIGHT;  Surgeon: Ashok Pall, MD;  Location: [facility];  Service: Neurosurgery;  Laterality: Right;  . punctured eardrum      to relieve blood build up   for ear infection  . TONSILLECTOMY      Current Medications: Current Meds  Medication Sig  . aspirin EC 81 MG tablet Take 81 mg by mouth daily after supper.   . clotrimazole (LOTRIMIN) 1 % cream Apply 1 application topically 2 (two) times daily. Groin rash  . esomeprazole (NEXIUM) 20 MG capsule Take 20 mg by mouth daily at 12 noon.  . finasteride  (PROSCAR) 5 MG tablet Take 1 tablet (5 mg total) by mouth daily after supper.  . gabapentin (NEURONTIN) 300 MG capsule Take 1 capsule (300 mg total) by mouth daily after supper.  . hydrALAZINE (APRESOLINE) 25 MG tablet Take 25 mg by mouth 2 (two) times daily as needed (high bp above 180).  . isosorbide mononitrate (IMDUR) 30 MG 24 hr tablet Take 1 tablet (30 mg total) by mouth daily.  Marland Kitchen lidocaine (LIDODERM) 5 % Place 1 patch onto the skin daily. Remove & Discard patch within 12 hours or as directed by MD  . meclizine (ANTIVERT) 12.5 MG tablet Take 12.5 mg by mouth 3 (three) times daily as needed for dizziness.  . midodrine (PROAMATINE) 10 MG tablet TAKE 1 TABLET BY MOUTH THREE TIMES A DAY (Patient taking differently: Take 10 mg by mouth 3 (three) times daily.)  . nitroGLYCERIN (NITROSTAT) 0.4 MG SL tablet Place 1 tablet (0.4  mg total) under the tongue every 5 (five) minutes as needed for chest pain (x 3 doses). Reported on 06/15/2015  . NON FORMULARY DIET :Regular NAS  . ondansetron (ZOFRAN ODT) 4 MG disintegrating tablet Take 1 tablet (4 mg total) by mouth every 8 (eight) hours as needed for nausea or vomiting.  . rosuvastatin (CRESTOR) 10 MG tablet Take 1 tablet (10 mg total) by mouth daily.  . sertraline (ZOLOFT) 100 MG tablet Take 1 tablet (100 mg total) by mouth daily.  . tamsulosin (FLOMAX) 0.4 MG CAPS capsule Take 1 capsule (0.4 mg total) by mouth daily after supper.  Marland Kitchen tiZANidine (ZANAFLEX) 2 MG tablet Take 1 tablet (2 mg total) by mouth See admin instructions. Take one tablet (2 mg) by mouth every morning, take one tablet (2 mg) later in the day as needed for pain  . traMADol (ULTRAM) 50 MG tablet Take 1 tablet (50 mg total) by mouth every 6 (six) hours as needed for moderate pain or severe pain.  . vitamin B-12 (CYANOCOBALAMIN) 1000 MCG tablet Take 1,000 mcg by mouth daily.      Allergies:   Iohexol, Ciprofloxacin, and Other   Social History   Socioeconomic History  . Marital status:  Married    Spouse name: Not on file  . Number of children: 5  . Years of education: Not on file  . Highest education level: Not on file  Occupational History  . Occupation: retired  Tobacco Use  . Smoking status: Former Smoker    Packs/day: 1.00    Years: 3.00    Pack years: 3.00    Types: Cigarettes    Quit date: 09/21/1955    Years since quitting: 64.6  . Smokeless tobacco: Never Used  Vaping Use  . Vaping Use: Never used  Substance and Sexual Activity  . Alcohol use: Yes    Alcohol/week: 0.0 standard drinks    Comment: one or two ounces a week  . Drug use: No  . Sexual activity: Not on file  Other Topics Concern  . Not on file  Social History Narrative   Lives with wife in a one story home.     Has 5 children.  (2 are deceased now) Retired Freight forwarder for [employer].     Education: 2 years of college.    Social Determinants of Health   Financial Resource Strain: Not on file  Food Insecurity: Not on file  Transportation Needs: Not on file  Physical Activity: Not on file  Stress: Not on file  Social Connections: Not on file     Family History:  The patient's family history includes Heart disease in his father. There is no history of Ataxia, Chorea, Dementia, Mental retardation, Migraines, Multiple sclerosis, Neurofibromatosis, Neuropathy, Parkinsonism, Seizures, or Stroke.  ROS:   Please see the history of present illness.  All other systems are reviewed and otherwise negative.    EKGs/Labs/Other Studies Reviewed:    Studies reviewed are outlined and summarized above. Reports included below if pertinent.  2D echo 07/2019  1. Left ventricular ejection fraction, by estimation, is 60 to 65%. The  left ventricle has normal function. The left ventricle has no regional  wall motion abnormalities. There is mild left ventricular hypertrophy.  Left ventricular diastolic parameters  are consistent with Grade II diastolic dysfunction (pseudonormalization).  Elevated left  ventricular end-diastolic pressure.  2. Right ventricular systolic function is normal. The right ventricular  size is mildly enlarged. There is mildly elevated pulmonary artery  systolic pressure.  3. Left atrial size was mildly dilated.  4. The mitral valve is degenerative. Trivial mitral valve regurgitation.  No evidence of mitral stenosis.  5. The aortic valve is tricuspid. Aortic valve regurgitation is trivial.  No aortic stenosis is present.  6. The inferior vena cava is normal in size with greater than 50%  respiratory variability, suggesting right atrial pressure of 3 mmHg.    LHC 2018  1. Left ventricular ejection fraction, by estimation, is 60 to 65%. The  left ventricle has normal function. The left ventricle has no regional  wall motion abnormalities. There is mild left ventricular hypertrophy.  Left ventricular diastolic parameters  are consistent with Grade II diastolic dysfunction (pseudonormalization).  Elevated left ventricular end-diastolic pressure.  2. Right ventricular systolic function is normal. The right ventricular  size is mildly enlarged. There is mildly elevated pulmonary artery  systolic pressure.  3. Left atrial size was mildly dilated.  4. The mitral valve is degenerative. Trivial mitral valve regurgitation.  No evidence of mitral stenosis.  5. The aortic valve is tricuspid. Aortic valve regurgitation is trivial.  No aortic stenosis is present.  6. The inferior vena cava is normal in size with greater than 50%  respiratory variability, suggesting right atrial pressure of 3 mmHg.     EKG:  EKG is ordered today, personally reviewed, demonstrating sinus tach 109bpm with occasional PACs, PVCs, prior inferior infarct, nonspecific STT changes - subtle ST sagging I, II, V2-V6 noted. This was subtly seen on prior tracing in 03-Feb-2023 but more pronounced on today's tracing.   Recent Labs: 08/13/2019: Magnesium 2.0; TSH 2.884 08/15/2019: ALT  23 02/22/2020: NT-Pro BNP 178 04/28/2020: BUN 28; Creatinine, Ser 0.81; Hemoglobin 12.2; Platelets 185; Potassium 4.7; Sodium 139  Recent Lipid Panel    Component Value Date/Time   CHOL 110 01/24/2016 0955   TRIG 133.0 01/24/2016 0955   TRIG 83 03/18/2006 0839   HDL 49.70 01/24/2016 0955   CHOLHDL 2 01/24/2016 0955   VLDL 26.6 01/24/2016 0955   LDLCALC 34 01/24/2016 0955    PHYSICAL EXAM:    VS:  BP 110/60   Pulse (!) 109   Ht 5\' 9"  (1.753 m)   Wt 172 lb (78 kg)   SpO2 97%   BMI 25.40 kg/m   BMI: Body mass index is 25.4 kg/m.  GEN: Well nourished, well developed debilitated WM, in no acute distress HEENT: normocephalic, atraumatic Neck: no JVD, carotid bruits, or masses Cardiac: tachycardic, some ectopy; no murmurs, rubs, or gallops, no edema  Respiratory:  Crackles at L lung base,  normal work of breathing GI: soft, nontender, nondistended, + BS MS: no deformity or atrophy Skin: warm and dry, no rash Neuro:  Alert and Oriented x 3, Strength and sensation are intact, follows commands Psych: euthymic mood, full affect  Wt Readings from Last 3 Encounters:  05/03/20 172 lb (78 kg)  04/27/20 177 lb 11.1 oz (80.6 kg)  02/22/20 177 lb 9.6 oz (80.6 kg)     ASSESSMENT & PLAN:   1. Pleuritic chest pain - He continues to have pleuritic chest pain on the left side. This may possibly be attributed to the rib fractures but his new tachycardia on exam concerns me, particularly since HR was relatively normal in the hospital and he has risk factor for VTE with recent trauma. We will check stat CBC and BMET (as well as updated thyroid although feel this is less likely contributing). I would have liked to pursue CTA but he has  a severe dye allergy per his report and would require premedication. Also has crackles in the left lung base today. Discussed case with DOD Dr. Rayann Heman. Given patient is currently clinically stable otherwise, will plan 2V CXR and stat VQ scan today. Also discussed  with radiology who felt that as long as CXR was not terrible, should be able to see the perfusion we need to see. EKG is also subtly more abnormal than prior but may be rate-related - no true accelerating anginal symptoms and conservative approach previously pursued. Addendum: labs, CXR and VQ were generally reassuring. Suspect his tachycardia may be related to ongoing recovery from rib fracture. Have recommended he keep intended primary care follow-up this week. We'll plan to see him back in 1-2 weeks to reassess preoperative cardiovascular clearance.  2. Preop cardiovascular examination - do not think we can currently clear him in the context of him still recovering from his recent injury. Recommend this be revisited in follow-up. Routed note to surgeon's office to make them aware.  3. Chronic dyspnea likely multifactorial, also occurring in context of CAD s/p prior CABG - continue to monitor clinically in setting of evolving findings above.   4. Orthostatic hypotension - stable on midodrine. No longer on ACEi.   5. Frequent PVCs - known finding for patient but generally asymptomatic. Occasional PVCs noted on EKG today. No longer on BB as above. Continue to observe clinically for now. Check BMET/Mg and thyroid today.   6. Remote atrial flutter - maintaining NSR.  Disposition: F/u with Dr. [name] or APP in 1-2 weeks in our office.  Medication Adjustments/Labs and Tests Ordered: Current medicines are reviewed at length with the patient today.  Concerns regarding medicines are outlined above. Medication changes, Labs and Tests ordered today are summarized above and listed in the Patient Instructions accessible in Encounters.   Signed, Charlie Pitter, PA-C  05/03/2020 10:32 AM    [facility] [address], [place], [place]  [address] Phone: [phone]; Fax: [phone]

## 2020-05-03 ENCOUNTER — Other Ambulatory Visit: Payer: Self-pay

## 2020-05-03 ENCOUNTER — Ambulatory Visit (HOSPITAL_COMMUNITY)
Admission: RE | Admit: 2020-05-03 | Discharge: 2020-05-03 | Disposition: A | Discharge status: Home / Self Care | Payer: Medicare Other | Source: Ambulatory Visit | Attending: Physician Assistant | Admitting: Physician Assistant

## 2020-05-03 ENCOUNTER — Encounter (HOSPITAL_COMMUNITY)
Admission: RE | Admit: 2020-05-03 | Discharge: 2020-05-03 | Disposition: A | Discharge status: Home / Self Care | Payer: Medicare Other | Source: Ambulatory Visit | Attending: Physician Assistant | Admitting: Physician Assistant

## 2020-05-03 ENCOUNTER — Other Ambulatory Visit: Payer: Self-pay | Admitting: Physician Assistant

## 2020-05-03 ENCOUNTER — Encounter: Payer: Self-pay | Admitting: Physician Assistant

## 2020-05-03 ENCOUNTER — Ambulatory Visit (INDEPENDENT_AMBULATORY_CARE_PROVIDER_SITE_OTHER): Payer: Medicare Other | Admitting: Physician Assistant

## 2020-05-03 VITALS — BP 110/60 | HR 109 | Ht 69.0 in | Wt 172.0 lb

## 2020-05-03 DIAGNOSIS — R079 Chest pain, unspecified: Secondary | ICD-10-CM

## 2020-05-03 DIAGNOSIS — I493 Ventricular premature depolarization: Secondary | ICD-10-CM | POA: Insufficient documentation

## 2020-05-03 DIAGNOSIS — I4892 Unspecified atrial flutter: Secondary | ICD-10-CM | POA: Diagnosis present

## 2020-05-03 DIAGNOSIS — Z01810 Encounter for preprocedural cardiovascular examination: Secondary | ICD-10-CM

## 2020-05-03 DIAGNOSIS — I951 Orthostatic hypotension: Secondary | ICD-10-CM | POA: Insufficient documentation

## 2020-05-03 DIAGNOSIS — R0781 Pleurodynia: Secondary | ICD-10-CM | POA: Diagnosis not present

## 2020-05-03 DIAGNOSIS — I2510 Atherosclerotic heart disease of native coronary artery without angina pectoris: Secondary | ICD-10-CM | POA: Diagnosis not present

## 2020-05-03 DIAGNOSIS — R0609 Other forms of dyspnea: Secondary | ICD-10-CM

## 2020-05-03 LAB — CBC
Hematocrit: 39.7 % (ref 37.5–51.0)
Hemoglobin: 13.3 g/dL (ref 13.0–17.7)
MCH: 29.4 pg (ref 26.6–33.0)
MCHC: 33.5 g/dL (ref 31.5–35.7)
MCV: 88 fL (ref 79–97)
Platelets: 198 10*3/uL (ref 150–450)
RBC: 4.52 x10E6/uL (ref 4.14–5.80)
RDW: 14.6 % (ref 11.6–15.4)
WBC: 12.7 10*3/uL — ABNORMAL HIGH (ref 3.4–10.8)

## 2020-05-03 LAB — BASIC METABOLIC PANEL
BUN/Creatinine Ratio: 29 — ABNORMAL HIGH (ref 10–24)
BUN: 22 mg/dL (ref 8–27)
CO2: 28 mmol/L (ref 20–29)
Calcium: 9.7 mg/dL (ref 8.6–10.2)
Chloride: 104 mmol/L (ref 96–106)
Creatinine, Ser: 0.77 mg/dL (ref 0.76–1.27)
GFR calc Af Amer: 95 mL/min/{1.73_m2} (ref >59)
GFR calc non Af Amer: 82 mL/min/{1.73_m2} (ref >59)
Glucose: 179 mg/dL — ABNORMAL HIGH (ref 65–99)
Potassium: 4.8 mmol/L (ref 3.5–5.2)
Sodium: 139 mmol/L (ref 134–144)

## 2020-05-03 LAB — MAGNESIUM: Magnesium: 2.2 mg/dL (ref 1.6–2.3)

## 2020-05-03 MED ORDER — TECHNETIUM TO 99M ALBUMIN AGGREGATED
4.4000 | Freq: Once | INTRAVENOUS | Status: AC | PRN
  Administered 2020-05-03: 4.4 via INTRAVENOUS

## 2020-05-03 NOTE — Patient Instructions (Addendum)
Medication Instructions:  Your physician recommends that you continue on your current medications as directed. Please refer to the Current Medication list given to you today.  *If you need a refill on your cardiac medications before your next appointment, please call your pharmacy*   Lab Work: TODAY:  STAT: BMET, PRO BNP, MAG, CBC, & TSH  If you have labs (blood work) drawn today and your tests are completely normal, you will receive your results only by: Marland Kitchen MyChart Message (if you have MyChart) OR . A paper copy in the mail If you have any lab test that is abnormal or we need to change your treatment, we will call you to review the results.   Testing/Procedures: A chest x-ray takes a picture of the organs and structures inside the chest, including the heart, lungs, and blood vessels. This test can show several things, including, whether the heart is enlarges; whether fluid is building up in the lungs; and whether pacemaker / defibrillator leads are still in place.  This will take place at [facility]   Your physician recommends you have a STAT VQ Scan.  This will be done at [facility].     Follow-Up: At [facility], you and your health needs are our priority.  As part of our continuing mission to provide you with exceptional heart care, we have created designated Provider Care Teams.  These Care Teams include your primary Cardiologist (physician) and Advanced Practice Providers (APPs -  Physician Assistants and Nurse Practitioners) who all work together to provide you with the care you need, when you need it.  We recommend signing up for the patient portal called "MyChart".  Sign up information is provided on this After Visit Summary.  MyChart is used to connect with patients for Virtual Visits (Telemedicine).  Patients are able to view lab/test results, encounter notes, upcoming appointments, etc.  Non-urgent messages can be sent to your provider as well.   To learn more  about what you can do with MyChart, go to [email or web address].    Your next appointment:   1-2 week(s)   05/18/2020 ARRIVE AT 11:00 TO SEE DAYNA DUNN, PA-C  The format for your next appointment:   In Person  Provider:   You may see Tobias Alexander, MD or one of the following Advanced Practice Providers on your designated Care Team:    Ronie Spies, PA-C  Jacolyn Reedy, PA-C    Other Instructions

## 2020-05-04 ENCOUNTER — Telehealth: Payer: Self-pay | Admitting: Physician Assistant

## 2020-05-04 ENCOUNTER — Ambulatory Visit: Payer: Medicare Other | Admitting: Physician Assistant

## 2020-05-04 NOTE — Telephone Encounter (Addendum)
So sorry to hear. I am glad his HR has returned closer to his baseline.   To add to prior note, I personally reviewed his CXR which did not show any evidence of cardiac enlargement suggestive of any significant pericardial effusion so symptoms are likely related to his recent fall and multiple rib fractures. CT scan recently also did not show any pericardial effusion. As you advised, plan to keep close f/u as requested with primary care with low threshold to return to hospital if pain persists. Rossanna Spitzley PA-C

## 2020-05-04 NOTE — Telephone Encounter (Signed)
Spoke with wife and she said that he isn't doing any good at all.  They had the EMT's out this morning due to pt being in extreme pain.  His bp was 142/94  HR was 90.  She had spoken with the doctor on call at their PCP's office and they had advised to give 2 Tramadol's and she said pt is resting at the moment. She is waiting on the PCP to call them.   She was given the option of going to the hospital, but considering the circumstance, they decided not to go and keep pt home.

## 2020-05-04 NOTE — Telephone Encounter (Signed)
   Please call Mr. Scholze to check on him this morning to see how he is feeling. Do they have a way to check his HR this morning? Thank you. Reggie Welge PA-C

## 2020-05-05 LAB — PRO B NATRIURETIC PEPTIDE: NT-Pro BNP: 152 pg/mL (ref 0–486)

## 2020-05-05 LAB — TSH: TSH: 2.02 u[IU]/mL (ref 0.450–4.500)

## 2020-05-06 ENCOUNTER — Encounter (HOSPITAL_COMMUNITY): Payer: Self-pay

## 2020-05-06 ENCOUNTER — Emergency Department (HOSPITAL_COMMUNITY): Payer: Medicare Other

## 2020-05-06 ENCOUNTER — Emergency Department (HOSPITAL_COMMUNITY)
Admission: EM | Admit: 2020-05-06 | Discharge: 2020-05-06 | Disposition: A | Discharge status: Home / Self Care | Payer: Medicare Other | Attending: Emergency Medicine | Admitting: Emergency Medicine

## 2020-05-06 ENCOUNTER — Other Ambulatory Visit: Payer: Self-pay

## 2020-05-06 DIAGNOSIS — I2510 Atherosclerotic heart disease of native coronary artery without angina pectoris: Secondary | ICD-10-CM | POA: Insufficient documentation

## 2020-05-06 DIAGNOSIS — Z951 Presence of aortocoronary bypass graft: Secondary | ICD-10-CM | POA: Diagnosis not present

## 2020-05-06 DIAGNOSIS — Z79899 Other long term (current) drug therapy: Secondary | ICD-10-CM | POA: Insufficient documentation

## 2020-05-06 DIAGNOSIS — Z85828 Personal history of other malignant neoplasm of skin: Secondary | ICD-10-CM | POA: Diagnosis not present

## 2020-05-06 DIAGNOSIS — I959 Hypotension, unspecified: Secondary | ICD-10-CM | POA: Diagnosis present

## 2020-05-06 DIAGNOSIS — Z20822 Contact with and (suspected) exposure to covid-19: Secondary | ICD-10-CM | POA: Diagnosis not present

## 2020-05-06 DIAGNOSIS — Z87891 Personal history of nicotine dependence: Secondary | ICD-10-CM | POA: Diagnosis not present

## 2020-05-06 DIAGNOSIS — I10 Essential (primary) hypertension: Secondary | ICD-10-CM | POA: Insufficient documentation

## 2020-05-06 DIAGNOSIS — Z7982 Long term (current) use of aspirin: Secondary | ICD-10-CM | POA: Diagnosis not present

## 2020-05-06 DIAGNOSIS — R531 Weakness: Secondary | ICD-10-CM

## 2020-05-06 LAB — BRAIN NATRIURETIC PEPTIDE: B Natriuretic Peptide: 101.4 pg/mL — ABNORMAL HIGH (ref 0.0–100.0)

## 2020-05-06 LAB — CBC WITH DIFFERENTIAL/PLATELET
Abs Immature Granulocytes: 0.18 10*3/uL — ABNORMAL HIGH (ref 0.00–0.07)
Basophils Absolute: 0.1 10*3/uL (ref 0.0–0.1)
Basophils Relative: 1 %
Eosinophils Absolute: 0.2 10*3/uL (ref 0.0–0.5)
Eosinophils Relative: 2 %
HCT: 38.1 % — ABNORMAL LOW (ref 39.0–52.0)
Hemoglobin: 12.5 g/dL — ABNORMAL LOW (ref 13.0–17.0)
Immature Granulocytes: 2 %
Lymphocytes Relative: 11 %
Lymphs Abs: 1.2 10*3/uL (ref 0.7–4.0)
MCH: 29.6 pg (ref 26.0–34.0)
MCHC: 32.8 g/dL (ref 30.0–36.0)
MCV: 90.3 fL (ref 80.0–100.0)
Monocytes Absolute: 0.7 10*3/uL (ref 0.1–1.0)
Monocytes Relative: 7 %
Neutro Abs: 8.5 10*3/uL — ABNORMAL HIGH (ref 1.7–7.7)
Neutrophils Relative %: 77 %
Platelets: 196 10*3/uL (ref 150–400)
RBC: 4.22 MIL/uL (ref 4.22–5.81)
RDW: 14.1 % (ref 11.5–15.5)
WBC: 10.9 10*3/uL — ABNORMAL HIGH (ref 4.0–10.5)
nRBC: 0 % (ref 0.0–0.2)

## 2020-05-06 LAB — URINALYSIS, ROUTINE W REFLEX MICROSCOPIC
Bilirubin Urine: NEGATIVE
Glucose, UA: NEGATIVE mg/dL
Hgb urine dipstick: NEGATIVE
Ketones, ur: NEGATIVE mg/dL
Leukocytes,Ua: NEGATIVE
Nitrite: NEGATIVE
Protein, ur: NEGATIVE mg/dL
Specific Gravity, Urine: 1.023 (ref 1.005–1.030)
pH: 5 (ref 5.0–8.0)

## 2020-05-06 LAB — COMPREHENSIVE METABOLIC PANEL
ALT: 20 U/L (ref 0–44)
AST: 26 U/L (ref 15–41)
Albumin: 3.3 g/dL — ABNORMAL LOW (ref 3.5–5.0)
Alkaline Phosphatase: 56 U/L (ref 38–126)
Anion gap: 8 (ref 5–15)
BUN: 21 mg/dL (ref 8–23)
CO2: 26 mmol/L (ref 22–32)
Calcium: 8.7 mg/dL — ABNORMAL LOW (ref 8.9–10.3)
Chloride: 105 mmol/L (ref 98–111)
Creatinine, Ser: 0.95 mg/dL (ref 0.61–1.24)
GFR, Estimated: >60 mL/min (ref >60)
Glucose, Bld: 137 mg/dL — ABNORMAL HIGH (ref 70–99)
Potassium: 4.5 mmol/L (ref 3.5–5.1)
Sodium: 139 mmol/L (ref 135–145)
Total Bilirubin: 0.6 mg/dL (ref 0.3–1.2)
Total Protein: 5.8 g/dL — ABNORMAL LOW (ref 6.5–8.1)

## 2020-05-06 LAB — TROPONIN I (HIGH SENSITIVITY): Troponin I (High Sensitivity): 7 ng/L (ref <18)

## 2020-05-06 LAB — POC SARS CORONAVIRUS 2 AG -  ED: SARS Coronavirus 2 Ag: NEGATIVE

## 2020-05-06 MED ORDER — SODIUM CHLORIDE 0.9 % IV BOLUS
1000.0000 mL | Freq: Once | INTRAVENOUS | Status: AC
  Administered 2020-05-06: 1000 mL via INTRAVENOUS

## 2020-05-06 NOTE — ED Notes (Signed)
Pt is requesting transport back home by EMS.  Confirmed with pt and spouse that they are wanting EMS transport back home.  Called for transport

## 2020-05-06 NOTE — ED Triage Notes (Signed)
Patient coming from home with c/o hypotensive today. Pt c/o nausea all day. Patient denies vomiting, abdominal pain and diarrhea. Supine BP 96/60, sitting 84 palpated c/o dizziness.

## 2020-05-06 NOTE — ED Provider Notes (Signed)
[facility] DEPT Provider Note   CSN: [id number] Arrival date & time: 05/06/20  1848     History No chief complaint on file.   Troy Combs is a 87 y.o. male.  HPI Patient presents with concern of hypotension, witnessed at home. Patient has multiple medical issues including fall 2 weeks ago resulting in hospitalization, during which he was found that the patient had multiple rib fractures. He notes that since going home he has been doing generally better, and when asked specifically about today versus yesterday he states that he has improved.  However, today, family member noted the patient had hypotension, and he was sent in for evaluation.  He denies any ongoing chest pain, abdominal pain, nausea, vomiting, fever, chills, dysuria.    Past Medical History:  Diagnosis Date  . Allergic rhinitis 09/20/2014  . Anemia, unspecified   . Arthritis   . Atrial flutter ([place])    a. s/p RFCA remotely.  . Autonomic dysfunction   . Bladder neck obstruction   . Cataracts, bilateral   . Coronary atherosclerosis of unspecified type of vessel, native or graft    CABG 2002  . Diminished hearing, bilateral   . Dysthymic disorder   . Fall   . GERD (gastroesophageal reflux disease)   . Heart murmur    as a child  . Impaired glucose tolerance 12/02/2012  . Impotence of organic origin   . Lumbar radiculopathy   . Malignant melanoma of skin of upper limb, including shoulder ([place])   . Old myocardial infarction   . Personal history of malignant melanoma of skin   . Personal history of other diseases of digestive system   . PVC's (premature ventricular contractions)   . Thoracic or lumbosacral neuritis or radiculitis, unspecified   . Unspecified essential hypertension     Patient Active Problem List   Diagnosis Date Noted  . Multiple closed fractures of ribs of left side 04/27/2020  . Fall 04/27/2020  . Intractable pain 04/27/2020  . Rib fractures 04/26/2020   . Postural dizziness with presyncope 08/13/2019  . Hypertensive urgency 08/12/2019  . Impacted cerumen of both ears 08/12/2019  . Personal history of malignant melanoma of skin   . Lumbar radiculopathy   . Heart murmur   . GERD (gastroesophageal reflux disease)   . Diminished hearing, bilateral   . Cataracts, bilateral   . Bladder neck obstruction   . Arthritis   . Constipation 12/12/2016  . Nausea 12/12/2016  . Coronary artery disease involving native coronary artery of native heart without angina pectoris 11/22/2016  . Dyslipidemia 11/22/2016  . History of coronary artery bypass surgery 11/22/2016  . Depression with anxiety   . Orthostasis 09/20/2016  . HNP (herniated nucleus pulposus), lumbar 02/02/2016  . Spinal stenosis of lumbar region 01/24/2016  . Sciatica, right side 01/24/2016  . Prediabetes 01/24/2016  . Otitis, externa, infective 01/24/2016  . Left lumbar radiculopathy 01/15/2016  . PVC's (premature ventricular contractions) 05/24/2015  . Peripheral polyneuropathy 03/06/2015  . BPPV (benign paroxysmal positional vertigo), right 03/06/2015  . Headache 01/18/2015  . Vertigo 01/18/2015  . Allergic rhinitis 09/20/2014  . Dyspnea on exertion 08/10/2014  . Severe dizziness 08/10/2014  . Diarrhea 05/18/2014  . LLQ pain 05/13/2014  . Chronic meniscal tear of knee 09/22/2013  . Primary localized osteoarthrosis, lower leg 09/22/2013  . Neck pain, bilateral 12/18/2012  . Headache(784.0) 12/18/2012  . Binocular visual disturbance 12/18/2012  . Epiretinal membrane 12/17/2012  . Status post intraocular lens  implant 12/17/2012  . Preventative health care 12/02/2012  . Low back pain 12/02/2012  . Impaired glucose tolerance 12/02/2012  . Palpitations 10/09/2010  . B12 deficiency 08/30/2010  . Hearing loss 08/30/2010  . Tinnitus 08/30/2010  . Neurogenic orthostatic hypotension ([place]) 06/28/2009  . Mixed hyperlipidemia 05/15/2009  . CHEST PAIN UNSPECIFIED 11/23/2008  .  Hypotension, unspecified 10/12/2008  . DIZZINESS 10/12/2008  . Shortness of breath 09/14/2008  . UNSPECIFIED ANEMIA 09/13/2008  . ANXIETY DEPRESSION 09/13/2008  . Old myocardial infarction 09/13/2008  . GASTROESOPHAGEAL REFLUX DISEASE, HX OF 09/13/2008  . LUMBAR RADICULOPATHY, LEFT 08/23/2008  . MELANOMA, SHOULDER 11/27/2006  . Essential hypertension 11/27/2006  . Coronary atherosclerosis 11/27/2006  . BPH associated with nocturia 11/27/2006  . ERECTILE DYSFUNCTION, ORGANIC 11/27/2006    Past Surgical History:  Procedure Laterality Date  . ARTHRODESIS  07/06/2002   of left long finger distal interphalangeal joint with Kirschner wire fixation X 3  . ATRIAL ABLATION SURGERY  05/04/2001   Dr. [name]  . CARDIAC CATHETERIZATION  09/05/2008   Revealing 4 of 4 patent grafts with native multivessel coronary artery disease, EF  of 60% without regional wall motion abnormalities.  Marland Kitchen CATARACT EXTRACTION, BILATERAL    . CHOLECYSTECTOMY    . COLONOSCOPY    . CORONARY ANGIOPLASTY  1993  . CORONARY ARTERY BYPASS GRAFT  10/17/2000   [name]. [name], [place]     AFTER LIVER TRAUMA AND HAND SURGERY  . EYE SURGERY    . HERNIA REPAIR     BILATERAL  . LEFT HEART CATH AND CORS/GRAFTS ANGIOGRAPHY N/A 04/03/2017   Procedure: LEFT HEART CATH AND CORS/GRAFTS ANGIOGRAPHY;  Surgeon: Leonie Man, MD;  Location: [facility];  Service: Cardiovascular;  Laterality: N/A;  . LUMBAR LAMINECTOMY/DECOMPRESSION MICRODISCECTOMY N/A 02/02/2016   Procedure: MICRODISCECTOMY LUMBAR FOUR- LUMBAR FIVE;  Surgeon: Ashok Pall, MD;  Location: [facility];  Service: Neurosurgery;  Laterality: N/A;  MICRODISCECTOMY L4-L5  . LUMBAR LAMINECTOMY/DECOMPRESSION MICRODISCECTOMY Right 08/02/2016   Procedure: MICRODISCECTOMY LUMBAR FOUR- LUMBAR FIVE RIGHT;  Surgeon: Ashok Pall, MD;  Location: [facility];  Service: Neurosurgery;  Laterality: Right;  . punctured eardrum      to relieve blood build up   for  ear infection  . TONSILLECTOMY         Family History  Problem Relation Age of Onset  . Heart disease Father   . Ataxia Neg Hx   . Chorea Neg Hx   . Dementia Neg Hx   . Mental retardation Neg Hx   . Migraines Neg Hx   . Multiple sclerosis Neg Hx   . Neurofibromatosis Neg Hx   . Neuropathy Neg Hx   . Parkinsonism Neg Hx   . Seizures Neg Hx   . Stroke Neg Hx     Social History   Tobacco Use  . Smoking status: Former Smoker    Packs/day: 1.00    Years: 3.00    Pack years: 3.00    Types: Cigarettes    Quit date: 09/21/1955    Years since quitting: 64.6  . Smokeless tobacco: Never Used  Vaping Use  . Vaping Use: Never used  Substance Use Topics  . Alcohol use: Yes    Alcohol/week: 0.0 standard drinks    Comment: one or two ounces a week  . Drug use: No    Home Medications Prior to Admission medications   Medication Sig Start Date End Date Taking? Authorizing Provider  aspirin EC 81  MG tablet Take 81 mg by mouth daily after supper.    Yes [provider]  clotrimazole (LOTRIMIN) 1 % cream Apply 1 application topically 2 (two) times daily as needed (rash). Groin rash   Yes [provider]  esomeprazole (NEXIUM) 20 MG capsule Take 20 mg by mouth daily at 12 noon.   Yes [provider]  finasteride (PROSCAR) 5 MG tablet Take 1 tablet (5 mg total) by mouth daily after supper. 09/03/19  Yes Lassen, Arlo C, PA-C  Flaxseed, Linseed, (FLAX SEED OIL) 1000 MG CAPS Take 1 capsule by mouth daily at 6 (six) AM.   Yes [provider]  gabapentin (NEURONTIN) 300 MG capsule Take 1 capsule (300 mg total) by mouth daily after supper. 04/28/20  Yes Rai, Ripudeep K, MD  hydrALAZINE (APRESOLINE) 25 MG tablet Take 25 mg by mouth 2 (two) times daily as needed (high bp above 180).   Yes [provider]  isosorbide mononitrate (IMDUR) 30 MG 24 hr tablet Take 1 tablet (30 mg total) by mouth daily. 04/28/20 07/27/20 Yes Rai, Ripudeep K, MD  lidocaine  (LIDODERM) 5 % Place 1 patch onto the skin daily. Remove & Discard patch within 12 hours or as directed by MD 04/29/20  Yes Rai, Ripudeep K, MD  meclizine (ANTIVERT) 12.5 MG tablet Take 12.5 mg by mouth 3 (three) times daily as needed for dizziness.   Yes [provider]  midodrine (PROAMATINE) 10 MG tablet TAKE 1 TABLET BY MOUTH THREE TIMES A DAY Patient taking differently: Take 10 mg by mouth 2 (two) times daily. 10/29/19  Yes Dorothy Spark, MD  nitroGLYCERIN (NITROSTAT) 0.4 MG SL tablet Place 1 tablet (0.4 mg total) under the tongue every 5 (five) minutes as needed for chest pain (x 3 doses). Reported on 06/15/2015 09/03/19  Yes Oscar La, Arlo C, PA-C  ondansetron (ZOFRAN ODT) 4 MG disintegrating tablet Take 1 tablet (4 mg total) by mouth every 8 (eight) hours as needed for nausea or vomiting. 04/28/20  Yes Rai, Ripudeep K, MD  polyethylene glycol (MIRALAX / GLYCOLAX) 17 g packet Take 17 g by mouth 2 (two) times daily.   Yes [provider]  rosuvastatin (CRESTOR) 10 MG tablet Take 1 tablet (10 mg total) by mouth daily. 09/03/19  Yes Lassen, Arlo C, PA-C  sertraline (ZOLOFT) 100 MG tablet Take 1 tablet (100 mg total) by mouth daily. 09/03/19  Yes Lassen, Arlo C, PA-C  tiZANidine (ZANAFLEX) 2 MG tablet Take 1 tablet (2 mg total) by mouth See admin instructions. Take one tablet (2 mg) by mouth every morning, take one tablet (2 mg) later in the day as needed for pain Patient taking differently: Take 2 mg by mouth in the morning and at bedtime. 04/28/20  Yes Rai, Ripudeep K, MD  traMADol (ULTRAM) 50 MG tablet Take 1 tablet (50 mg total) by mouth every 6 (six) hours as needed for moderate pain or severe pain. Patient taking differently: Take 100 mg by mouth 2 (two) times daily as needed for moderate pain or severe pain. 04/28/20  Yes Rai, Ripudeep K, MD  vitamin B-12 (CYANOCOBALAMIN) 1000 MCG tablet Take 1,000 mcg by mouth daily.   Yes [provider]  tamsulosin (FLOMAX) 0.4 MG CAPS  capsule Take 1 capsule (0.4 mg total) by mouth daily after supper. 09/03/19   Granville Lewis C, PA-C    Allergies    Iohexol, Ciprofloxacin, and Other  Review of Systems   Review of Systems  Physical Exam Updated  Vital Signs BP 125/75   Pulse 67   Temp 98.4 F (36.9 C) (Oral)   Resp (!) 22   SpO2 96%   Physical Exam  ED Results / Procedures / Treatments   Labs (all labs ordered are listed, but only abnormal results are displayed) Labs Reviewed  COMPREHENSIVE METABOLIC PANEL - Abnormal; Notable for the following components:      Result Value   Glucose, Bld 137 (*)    Calcium 8.7 (*)    Total Protein 5.8 (*)    Albumin 3.3 (*)    All other components within normal limits  BRAIN NATRIURETIC PEPTIDE - Abnormal; Notable for the following components:   B Natriuretic Peptide 101.4 (*)    All other components within normal limits  CBC WITH DIFFERENTIAL/PLATELET - Abnormal; Notable for the following components:   WBC 10.9 (*)    Hemoglobin 12.5 (*)    HCT 38.1 (*)    Neutro Abs 8.5 (*)    Abs Immature Granulocytes 0.18 (*)    All other components within normal limits  URINALYSIS, ROUTINE W REFLEX MICROSCOPIC - Abnormal; Notable for the following components:   Color, Urine AMBER (*)    APPearance HAZY (*)    All other components within normal limits  POC SARS CORONAVIRUS 2 AG -  ED  TROPONIN I (HIGH SENSITIVITY)  TROPONIN I (HIGH SENSITIVITY)    EKG EKG Interpretation  Date/Time:  Saturday May 06 2020 20:33:19 EST Ventricular Rate:  75 PR Interval:    QRS Duration: 75 QT Interval:  402 QTC Calculation: 449 R Axis:   -2 Text Interpretation: Sinus rhythm Low voltage, precordial leads Abnormal R-wave progression, early transition Artifact Abnormal ECG Confirmed by Gerhard Munch [phone]) on 05/07/2020 1:36:32 PM    Cardiac monitor the patient has sinus rhythm, rate 70s, unremarkable. Pulse oximetry 96% room air unremarkable  Radiology DG Chest Port 1  View  Result Date: 05/06/2020 CLINICAL DATA:  Hypotension, nausea EXAM: PORTABLE CHEST 1 VIEW COMPARISON:  May 03, 2020 FINDINGS: The cardiomediastinal silhouette is unchanged in contour.Status post median sternotomy and CABG. Atherosclerotic calcifications of the aorta. Unchanged elevation of the RIGHT hemidiaphragm. Low lung volumes with bronchovascular crowding. No pleural effusion. No pneumothorax. Unchanged LEFT retrocardiac opacities likely reflecting scarring/atelectasis. Unchanged linear RIGHT basilar opacities consistent with atelectasis. No new focal opacity. Visualized abdomen is unremarkable. Multilevel degenerative changes of the thoracic spine. IMPRESSION: Bibasilar atelectasis versus scarring. Electronically Signed   By: Meda Klinefelter MD   On: 05/06/2020 20:06    Procedures Procedures (including critical care time)  Medications Ordered in ED Medications  sodium chloride 0.9 % bolus 1,000 mL (0 mLs Intravenous Stopped 05/06/20 2119)    ED Course  I have reviewed the triage vital signs and the nursing notes.  Pertinent labs & imaging results that were available during my care of the patient were reviewed by me and considered in my medical decision making (see chart for details).   Update:, Patient accompanied by his wife. She notes that the patient appears better than earlier in the day.  She now contributes that the patient took tramadol, similar for the first time for his rib fractures earlier in the day.  He took 2 doses.  Seemingly the patient's lightheadedness, slowness of speech occurred after that.  Patient reiterates that he feels generally well, has been improving steadily since the fall, including since yesterday We had a lengthy conversation about today's evaluation, his x-ray included, no evidence for obvious pneumonia, with no  new oxygen requirement, no substantial change in his condition here, the patient is amenable to discharge, acknowledges the importance of  following up with his primary care physician. Patient discharged thin stable condition. MDM Rules/Calculators/A&P MDM Number of Diagnoses or Management Options Weakness: new, needed workup   Amount and/or Complexity of Data Reviewed Clinical lab tests: reviewed Tests in the radiology section of CPT: reviewed Tests in the medicine section of CPT: reviewed Decide to obtain previous medical records or to obtain history from someone other than the patient: yes Obtain history from someone other than the patient: yes Review and summarize past medical records: yes Independent visualization of images, tracings, or specimens: yes  Risk of Complications, Morbidity, and/or Mortality Presenting problems: high Diagnostic procedures: high Management options: high  Critical Care Total time providing critical care: < 30 minutes  Patient Progress Patient progress: stable   Final Clinical Impression(s) / ED Diagnoses Final diagnoses:  Weakness     Carmin Muskrat, MD 05/07/20 1336

## 2020-05-06 NOTE — Discharge Instructions (Addendum)
As discussed, your evaluation today has been largely reassuring.  But, it is important that you monitor your condition carefully, and do not hesitate to return to the ED if you develop new, or concerning changes in your condition. ? ?Otherwise, please follow-up with your physician for appropriate ongoing care. ? ?

## 2020-05-06 NOTE — ED Notes (Signed)
Pt has urinal at the bedside.  Advised him that urine sample is needed

## 2020-05-15 ENCOUNTER — Encounter: Payer: Self-pay | Admitting: Orthopedic Surgery

## 2020-05-15 ENCOUNTER — Non-Acute Institutional Stay (SKILLED_NURSING_FACILITY): Payer: Medicare Other | Admitting: Orthopedic Surgery

## 2020-05-15 DIAGNOSIS — R351 Nocturia: Secondary | ICD-10-CM

## 2020-05-15 DIAGNOSIS — R42 Dizziness and giddiness: Secondary | ICD-10-CM

## 2020-05-15 DIAGNOSIS — R55 Syncope and collapse: Secondary | ICD-10-CM

## 2020-05-15 DIAGNOSIS — I2510 Atherosclerotic heart disease of native coronary artery without angina pectoris: Secondary | ICD-10-CM | POA: Diagnosis not present

## 2020-05-15 DIAGNOSIS — R0902 Hypoxemia: Secondary | ICD-10-CM

## 2020-05-15 DIAGNOSIS — I10 Essential (primary) hypertension: Secondary | ICD-10-CM | POA: Diagnosis not present

## 2020-05-15 DIAGNOSIS — S2242XD Multiple fractures of ribs, left side, subsequent encounter for fracture with routine healing: Secondary | ICD-10-CM

## 2020-05-15 DIAGNOSIS — N401 Enlarged prostate with lower urinary tract symptoms: Secondary | ICD-10-CM | POA: Diagnosis not present

## 2020-05-15 DIAGNOSIS — E782 Mixed hyperlipidemia: Secondary | ICD-10-CM

## 2020-05-15 NOTE — Progress Notes (Signed)
Location:    [facility] Room Number: 109/P Place of Service:  SNF (31) Provider: Hazle Nordmann NP   Tally Joe, MD  Patient Care Team: Tally Joe, MD as PCP - General (Family Medicine) Lars Masson, MD as PCP - Cardiology (Cardiology) Duke Salvia, MD as PCP - Electrophysiology (Cardiology) Daleen Squibb, Jesse Sans, MD (Inactive) (Cardiology) Van Clines, MD as Consulting Physician (Neurology)  Extended Emergency Contact Information Primary Emergency Contact: Lequita Asal Address: [address], [place] [address] [place] Home Phone: [phone] Relation: Spouse Secondary Emergency Contact: Serafina [place] of [place] Home Phone: [phone] Relation: Daughter  Code Status:  DNR Goals of care: Advanced Directive information Advanced Directives 05/15/2020  Does Patient Have a Medical Advance Directive? Yes  Type of Advance Directive Out of facility DNR (pink MOST or yellow form)  Does patient want to make changes to medical advance directive? No - Patient declined  Copy of Healthcare Power of Attorney in Chart? -  Would patient like information on creating a medical advance directive? -  Pre-existing out of facility DNR order (yellow form or pink MOST form) Yellow form placed in chart (order not valid for inpatient use)     Chief Complaint  Patient presents with  . Hospitalization Follow-up    Hospitalization Follow Up     HPI:  Pt is a 87 y.o. male seen today for a hospital f/u from [facility] 01/08.   Resident of [facility] since 01/14, seen at bedside today. PMH includes: neurogenic orthostatic hypotension, CAD, hypertension, GERD, BPPV, lumbar radiculopathy, hyperlipidemia, depression with anxiety and falls.   Prior to hospitalizations he lived at home with his wife and ambulated with walker. 12/28 he hit the side of his recliner. He was in severe pain  after incident. Contacted his PCP and given medication, he claims it did not help. Taken to ED by EMS, he presented with pain while breathing. Given oxycodone for pain and started to vomit. Xray revealed fracture of ribs 7-9, nondisplaced. He was admitted 12/29 for mechanical fall with rib fractures and mild acute respiratory failure with hypoxia due to splinting and intractable pain. He was given tylenol, tramadol and lidocaine patch for rib fracture pain. In addition, blood pressure was soft, ACE inhibitor held, imdur continued. He required 2 liters O2 with ambulation. Advised to be discharged to snf, but declined. Home health PT and oxygen ordered for home. Discharged home 12/31.   He presented to the [facility] ED 01/08 for weakness. Family concerned he was hypotensive. Wife stated he appeared lightheaded with some slowness of speech. Chest xray negative for pneumonia. He was not requiring oxygen at the time, sats 96 %. Sinus rhythm. He was discharged home and advised to follow up with PCP.   He was seen by Dr. Tally Joe 01/13. Snf recommended. Presented to [facility] 01/14.   Today, he is alert and oriented x 4. Follows commands and can express needs. Ambulating about 10 feet with walker, minimal assistance. Requiring 2 liters oxygen at this time. He states at home he use to use a vertical walker. Upset he does not have one here. He denies pain or sob. He has chronic low back pain, states tramadol and tylenol help relieve pain. Left sided rib pain minor, only with deep breathing. Moving bowels. States he was incontinent last night because he could not ambulate to bathroom.  Upset he called for help and nurse did not come in time.   Facility nurse does not report any concerns at this time, vitals stable.    Past Medical History:  Diagnosis Date  . Allergic rhinitis 09/20/2014  . Anemia, unspecified   . Arthritis   . Atrial flutter (HCC)    a. s/p RFCA remotely.  . Autonomic dysfunction   .  Bladder neck obstruction   . Cataracts, bilateral   . Coronary atherosclerosis of unspecified type of vessel, native or graft    CABG 2002  . Diminished hearing, bilateral   . Dysthymic disorder   . Fall   . GERD (gastroesophageal reflux disease)   . Heart murmur    as a child  . Impaired glucose tolerance 12/02/2012  . Impotence of organic origin   . Lumbar radiculopathy   . Malignant melanoma of skin of upper limb, including shoulder (HCC)   . Old myocardial infarction   . Personal history of malignant melanoma of skin   . Personal history of other diseases of digestive system   . PVC's (premature ventricular contractions)   . Thoracic or lumbosacral neuritis or radiculitis, unspecified   . Unspecified essential hypertension    Past Surgical History:  Procedure Laterality Date  . ARTHRODESIS  07/06/2002   of left long finger distal interphalangeal joint with Kirschner wire fixation X 3  . ATRIAL ABLATION SURGERY  05/04/2001   Dr. Lewayne Bunting  . CARDIAC CATHETERIZATION  09/05/2008   Revealing 4 of 4 patent grafts with native multivessel coronary artery disease, EF  of 60% without regional wall motion abnormalities.  Marland Kitchen CATARACT EXTRACTION, BILATERAL    . CHOLECYSTECTOMY    . COLONOSCOPY    . CORONARY ANGIOPLASTY  1993  . CORONARY ARTERY BYPASS GRAFT  10/17/2000   Gwenith Daily. Tyrone Sage, MD  . EXPLORATORY LAPAROTOMY     AFTER LIVER TRAUMA AND HAND SURGERY  . EYE SURGERY    . HERNIA REPAIR     BILATERAL  . LEFT HEART CATH AND CORS/GRAFTS ANGIOGRAPHY N/A 04/03/2017   Procedure: LEFT HEART CATH AND CORS/GRAFTS ANGIOGRAPHY;  Surgeon: Marykay Lex, MD;  Location: [facility] INVASIVE CV LAB;  Service: Cardiovascular;  Laterality: N/A;  . LUMBAR LAMINECTOMY/DECOMPRESSION MICRODISCECTOMY N/A 02/02/2016   Procedure: MICRODISCECTOMY LUMBAR FOUR- LUMBAR FIVE;  Surgeon: Coletta Memos, MD;  Location: [facility] OR;  Service: Neurosurgery;  Laterality: N/A;  MICRODISCECTOMY L4-L5  . LUMBAR  LAMINECTOMY/DECOMPRESSION MICRODISCECTOMY Right 08/02/2016   Procedure: MICRODISCECTOMY LUMBAR FOUR- LUMBAR FIVE RIGHT;  Surgeon: Coletta Memos, MD;  Location: [facility] OR;  Service: Neurosurgery;  Laterality: Right;  . punctured eardrum      to relieve blood build up   for ear infection  . TONSILLECTOMY      Allergies  Allergen Reactions  . Iohexol Other (See Comments)     Code: HIVES, Desc: PATENT STATES HE IS ALLERGIC TO IV DYE 09/14/08/RM, Onset Date: 16109604   . Ciprofloxacin Hives and Rash    "Patient unsure" just know he has a reaction to med Medical record indicates allergic reaction  . Other Other (See Comments)    UNSPECIFIED REACTION  Angioplasty dye pt.>> Pt only recalls Had "very bad reaction"    Allergies as of 05/15/2020      Reactions   Iohexol Other (See Comments)    Code: HIVES, Desc: PATENT STATES HE IS ALLERGIC TO IV DYE 09/14/08/RM, Onset Date: 54098119   Ciprofloxacin Hives, Rash   "Patient unsure" just know he has a  reaction to med Medical record indicates allergic reaction   Other Other (See Comments)   UNSPECIFIED REACTION  Angioplasty dye pt.>> Pt only recalls Had "very bad reaction"      Medication List       Accurate as of May 15, 2020  2:18 PM. If you have any questions, ask your nurse or doctor.        aspirin EC 81 MG tablet Take 81 mg by mouth daily after supper.   clotrimazole 1 % cream Commonly known as: LOTRIMIN Apply 1 application topically 2 (two) times daily as needed (rash). Groin rash   esomeprazole 20 MG capsule Commonly known as: NEXIUM Take 20 mg by mouth daily at 12 noon.   finasteride 5 MG tablet Commonly known as: PROSCAR Take 1 tablet (5 mg total) by mouth daily after supper.   Flax Seed Oil 1000 MG Caps Take 1 capsule by mouth daily at 6 (six) AM.   gabapentin 300 MG capsule Commonly known as: NEURONTIN Take 1 capsule (300 mg total) by mouth daily after supper.   hydrALAZINE 25 MG tablet Commonly known as:  APRESOLINE Take 50 mg by mouth daily as needed (high bp above 180).   isosorbide mononitrate 30 MG 24 hr tablet Commonly known as: IMDUR Take 1 tablet (30 mg total) by mouth daily.   lidocaine 5 % Commonly known as: LIDODERM Place 1 patch onto the skin daily. Remove & Discard patch within 12 hours or as directed by MD   meclizine 12.5 MG tablet Commonly known as: ANTIVERT Take 12.5 mg by mouth every 8 (eight) hours as needed. For Vertigo   midodrine 10 MG tablet Commonly known as: PROAMATINE TAKE 1 TABLET BY MOUTH THREE TIMES A DAY   nitroGLYCERIN 0.4 MG SL tablet Commonly known as: NITROSTAT Place 1 tablet (0.4 mg total) under the tongue every 5 (five) minutes as needed for chest pain (x 3 doses). Reported on 06/15/2015   ondansetron 4 MG disintegrating tablet Commonly known as: Zofran ODT Take 1 tablet (4 mg total) by mouth every 8 (eight) hours as needed for nausea or vomiting.   OXYGEN Inhale 2 L into the lungs continuous.   polyethylene glycol 17 g packet Commonly known as: MIRALAX / GLYCOLAX Take 17 g by mouth 2 (two) times daily.   rosuvastatin 10 MG tablet Commonly known as: CRESTOR Take 1 tablet (10 mg total) by mouth daily.   sertraline 100 MG tablet Commonly known as: ZOLOFT Take 1 tablet (100 mg total) by mouth daily.   tamsulosin 0.4 MG Caps capsule Commonly known as: FLOMAX Take 0.4 mg by mouth daily. For [facility] What changed: Another medication with the same name was removed. Continue taking this medication, and follow the directions you see here. Changed by: Octavia Heir, NP   tiZANidine 2 MG tablet Commonly known as: ZANAFLEX Take 2 mg by mouth at bedtime as needed for muscle spasms. What changed: Another medication with the same name was removed. Continue taking this medication, and follow the directions you see here. Changed by: Octavia Heir, NP   traMADol HCl 100 MG Tabs Take 100 mg by mouth 3 (three) times daily. For Back/ Rib Pain What changed:  Another medication with the same name was removed. Continue taking this medication, and follow the directions you see here. Changed by: Octavia Heir, NP   vitamin B-12 1000 MCG tablet Commonly known as: CYANOCOBALAMIN Take 1,000 mcg by mouth daily.       Review  of Systems  Constitutional: Negative for activity change, appetite change and fever.  HENT: Negative for dental problem, hearing loss and trouble swallowing.   Eyes: Negative for photophobia.  Respiratory: Negative for cough, shortness of breath and wheezing.        Oxygen use  Cardiovascular: Negative for chest pain and leg swelling.  Gastrointestinal: Negative for abdominal pain, constipation, diarrhea and nausea.  Genitourinary: Negative for dysuria, frequency and hematuria.  Musculoskeletal: Positive for back pain.       Left sided pain, falls  Skin:       Dry skin, left side bruising  Neurological: Positive for weakness. Negative for dizziness, light-headedness and headaches.  Psychiatric/Behavioral: Negative for confusion and dysphoric mood. The patient is not nervous/anxious.     Immunization History  Administered Date(s) Administered  . Influenza Split 02/02/2020  . Influenza, High Dose Seasonal PF 02/25/2013, 01/17/2017, 03/28/2018, 03/09/2019  . Influenza,inj,Quad PF,6+ Mos 01/20/2014, 01/18/2015, 01/24/2016  . PFIZER(Purple Top)SARS-COV-2 Vaccination 06/25/2019, 07/21/2019, 02/02/2020  . Pneumococcal Conjugate-13 02/03/2014  . Pneumococcal Polysaccharide-23 02/11/2006  . Zoster 04/03/2018, 03/09/2019  . Zoster Recombinat (Shingrix) 04/03/2018   Pertinent  Health Maintenance Due  Topic Date Due  . INFLUENZA VACCINE  Completed  . PNA vac Low Risk Adult  Completed   Fall Risk  10/19/2019 09/20/2019 05/01/2018 09/19/2017 06/04/2017  Falls in the past year? 1 1 0 No Yes  Number falls in past yr: 1 0 0 - 1  Injury with Fall? 0 0 0 - Yes  Risk Factor Category  - - - - High Fall Risk  Risk for fall due to : Impaired  balance/gait;Impaired mobility - - - -   Functional Status Survey:    Vitals:   05/15/20 1416  BP: 124/71  Pulse: 68  Resp: 16  Temp: 98 F (36.7 C)  SpO2: 95%  Weight: 172 lb (78 kg)  Height: 5\' 9"  (1.753 m)   Body mass index is 25.4 kg/m. Physical Exam Vitals reviewed.  Constitutional:      General: He is not in acute distress. HENT:     Head: Normocephalic.     Right Ear: There is no impacted cerumen.     Left Ear: There is no impacted cerumen.     Nose: Nose normal.     Mouth/Throat:     Mouth: Mucous membranes are moist.     Pharynx: No posterior oropharyngeal erythema.  Eyes:     General:        Right eye: No discharge.        Left eye: No discharge.  Cardiovascular:     Rate and Rhythm: Normal rate and regular rhythm.     Pulses: Normal pulses.     Heart sounds: Normal heart sounds. No murmur heard.   Pulmonary:     Effort: Pulmonary effort is normal. No respiratory distress.     Breath sounds: Normal breath sounds. No wheezing.  Abdominal:     General: Bowel sounds are normal. There is no distension.     Palpations: Abdomen is soft.     Tenderness: There is no abdominal tenderness.  Musculoskeletal:     Cervical back: Normal range of motion.     Right lower leg: No edema.     Left lower leg: No edema.  Lymphadenopathy:     Cervical: No cervical adenopathy.  Skin:    Comments: Left sided bruise, quarter size, purple-yellow in color.   Neurological:     Mental Status: He is alert.  Labs reviewed: Recent Labs    08/13/19 0247 08/15/19 1725 04/28/20 0459 05/03/20 1112 05/06/20 1942  NA  --    < > 139 139 139  K  --    < > 4.7 4.8 4.5  CL  --    < > 106 104 105  CO2  --    < > 25 28 26   GLUCOSE  --    < > 143* 179* 137*  BUN  --    < > 28* 22 21  CREATININE  --    < > 0.81 0.77 0.95  CALCIUM  --    < > 9.0 9.7 8.7*  MG 2.0  --   --  2.2  --    < > = values in this interval not displayed.   Recent Labs    08/11/19 0947  08/15/19 1725 05/06/20 1942  AST 24 23 26   ALT 26 23 20   ALKPHOS 75 43 56  BILITOT 0.5 0.9 0.6  PROT 6.3 5.7* 5.8*  ALBUMIN 4.5 3.4* 3.3*   Recent Labs    08/19/19 0000 02/22/20 1246 04/26/20 1717 04/27/20 0732 04/28/20 0459 05/03/20 1112 05/06/20 1942  WBC 9.7   < > 14.2*   < > 10.5 12.7* 10.9*  NEUTROABS 7  --  11.8*  --   --   --  8.5*  HGB 14.8   < > 14.9   < > 12.2* 13.3 12.5*  HCT 44   < > 43.0   < > 36.8* 39.7 38.1*  MCV  --    < > 86.7   < > 89.3 88 90.3  PLT 149*   < > 167   < > 185 198 196   < > = values in this interval not displayed.   Lab Results  Component Value Date   TSH 2.020 05/03/2020   Lab Results  Component Value Date   HGBA1C 6.4 (H) 08/13/2019   Lab Results  Component Value Date   CHOL 110 01/24/2016   HDL 49.70 01/24/2016   LDLCALC 34 01/24/2016   TRIG 133.0 01/24/2016   CHOLHDL 2 01/24/2016    Significant Diagnostic Results in last 30 days:  DG Chest Port 1 View  Result Date: 05/06/2020 CLINICAL DATA:  Hypotension, nausea EXAM: PORTABLE CHEST 1 VIEW COMPARISON:  May 03, 2020 FINDINGS: The cardiomediastinal silhouette is unchanged in contour.Status post median sternotomy and CABG. Atherosclerotic calcifications of the aorta. Unchanged elevation of the RIGHT hemidiaphragm. Low lung volumes with bronchovascular crowding. No pleural effusion. No pneumothorax. Unchanged LEFT retrocardiac opacities likely reflecting scarring/atelectasis. Unchanged linear RIGHT basilar opacities consistent with atelectasis. No new focal opacity. Visualized abdomen is unremarkable. Multilevel degenerative changes of the thoracic spine. IMPRESSION: Bibasilar atelectasis versus scarring. Electronically Signed   By: Meda Klinefelter MD   On: 05/06/2020 20:06    Assessment/Plan 1. Essential hypertension - bp at goal < 150/90 - continue hydralizine prn when sbp > 180 - continue imdur regimen - continue to limit sodium in diet, < 2000 mg/day - cbc/diff in 1  week - bmp in 1 week  2. Coronary artery disease involving native coronary artery of native heart without angina pectoris - stable, asymptomatic - continue asa, imdur, and crestor  3. BPH associated with nocturia - stable with flomax  4. Mixed hyperlipidemia - stable with crestor, continue regimen - continue to limit foods high in fat and fried foods  5. Closed fracture of multiple ribs of left side with routine healing,  subsequent encounter - stable, patient denies pain, slight bruising almost healed - he is at high risk for pneumonia at this time - continue incentive spirometer 1-10 times QID - continue lidocaine patch and tramadol for pain - continue PT/OT  6. Hypoxia - stable with 2 liters oxygen, lung sounds clear - recommend weaning trials with oxygen    Family/ staff Communication:  Plan discussed with patient and facility nurse  Labs/tests ordered:  Cbc/diff, bmp in 1 week

## 2020-05-16 ENCOUNTER — Other Ambulatory Visit: Payer: Self-pay | Admitting: Orthopedic Surgery

## 2020-05-16 DIAGNOSIS — M5416 Radiculopathy, lumbar region: Secondary | ICD-10-CM

## 2020-05-16 MED ORDER — TRAMADOL HCL 100 MG PO TABS: 100.0000 mg | ORAL_TABLET | Freq: Three times a day (TID) | ORAL | 0 refills | Status: AC

## 2020-05-18 ENCOUNTER — Ambulatory Visit: Payer: Medicare Other | Admitting: Physician Assistant

## 2020-05-24 ENCOUNTER — Inpatient Hospital Stay (HOSPITAL_COMMUNITY): Admission: RE | Admit: 2020-05-24 | Payer: Medicare Other | Source: Home / Self Care | Admitting: Neurosurgery

## 2020-05-24 ENCOUNTER — Encounter (HOSPITAL_COMMUNITY): Admission: RE | Payer: Self-pay | Source: Home / Self Care

## 2020-05-24 SURGERY — POSTERIOR LUMBAR FUSION 1 LEVEL
Anesthesia: General

## 2020-05-26 ENCOUNTER — Encounter: Payer: Self-pay | Admitting: Internal Medicine

## 2020-05-26 ENCOUNTER — Non-Acute Institutional Stay (SKILLED_NURSING_FACILITY): Payer: Medicare Other | Admitting: Internal Medicine

## 2020-05-26 DIAGNOSIS — H8111 Benign paroxysmal vertigo, right ear: Secondary | ICD-10-CM

## 2020-05-26 DIAGNOSIS — I10 Essential (primary) hypertension: Secondary | ICD-10-CM | POA: Diagnosis not present

## 2020-05-26 DIAGNOSIS — M5416 Radiculopathy, lumbar region: Secondary | ICD-10-CM

## 2020-05-26 DIAGNOSIS — I2510 Atherosclerotic heart disease of native coronary artery without angina pectoris: Secondary | ICD-10-CM

## 2020-05-26 DIAGNOSIS — R55 Syncope and collapse: Secondary | ICD-10-CM

## 2020-05-26 DIAGNOSIS — Z66 Do not resuscitate: Secondary | ICD-10-CM

## 2020-05-26 DIAGNOSIS — R42 Dizziness and giddiness: Secondary | ICD-10-CM

## 2020-05-26 DIAGNOSIS — R0902 Hypoxemia: Secondary | ICD-10-CM

## 2020-05-26 DIAGNOSIS — N401 Enlarged prostate with lower urinary tract symptoms: Secondary | ICD-10-CM

## 2020-05-26 DIAGNOSIS — R351 Nocturia: Secondary | ICD-10-CM

## 2020-05-26 DIAGNOSIS — S2242XD Multiple fractures of ribs, left side, subsequent encounter for fracture with routine healing: Secondary | ICD-10-CM

## 2020-05-26 NOTE — Progress Notes (Signed)
Provider:  Rexene Edison. Mariea Clonts, D.O., C.M.D. Location:  [facility] Room Number: [place] of Service:  SNF (31)  PCP: Antony Contras, MD Patient Care Team: Antony Contras, MD as PCP - General (Family Medicine) Dorothy Spark, MD as PCP - Cardiology (Cardiology) Deboraha Sprang, MD as PCP - Electrophysiology (Cardiology) Verl Blalock, Marijo Conception, MD (Inactive) (Cardiology) Cameron Sprang, MD as Consulting Physician (Neurology)  Extended Emergency Contact Information Primary Emergency Contact: Darrol Jump Address: [address] [place] [address] Johnnette Litter of [place] Phone: [phone] Relation: Spouse Secondary Emergency Contact: Sherrye Payor [place] of [place] Phone: [phone] Relation: Daughter  Code Status: DNR Goals of Care: Advanced Directive information Advanced Directives 05/15/2020  Does Patient Have a Medical Advance Directive? Yes  Type of Advance Directive Out of facility DNR (pink MOST or yellow form)  Does patient want to make changes to medical advance directive? No - Patient declined  Copy of [place] in Chart? -  Would patient like information on creating a medical advance directive? -  Pre-existing out of facility DNR order (yellow form or pink MOST form) Yellow form placed in chart (order not valid for inpatient use)      Chief Complaint  Patient presents with  . New Admit To SNF    New Admission to [facility] SNF    HPI: Patient is a 87 y.o. male seen today for admission to [facility] from home. He has a past medical history significant for lumbar spinal stenosis, constipation, recent rib fractures, heart attack in 2002, BPH, and vertigo. He had a hospitalization from December 29 to December 31 after suffering a fall striking the left rib cage on the side of his recliner. Normally ambulates with a walker but had lost his balance on the night of 1228. Was  given oxycodone in the emergency department which made him vomit. X-rays there revealed a fracture of his left seventh through ninth ribs which were  Nondisplaced. He was treated with pain management and incentive spirometry. He was continued on oxygen at 2 L and sats were 92% while ambulating and 92% off oxygen at rest. Was discharged with home oxygen. He was getting home health PT. When seen in his doctor's office (Dr. Moreen Fowler on 05/05/20) he was having a considerable amount of pain. Pain was 6-7 out of 10 despite use of tramadol. He was in need of a hospital bed due to difficulty with ambulation. He was needing assistance to get out of bed due to severe pain and also has some chronic pain related to lumbar spinal stenosis. He was scheduled to have a lumbar fusion tentatively done on January 26 with Dr. Christella Noa. He was also having challenges with constipation using MiraLAX. He had extensive imaging at the hospital including CT of the abdomen and pelvis, left rib series, CT of the head, CT of the chest and CT of the spine. He also had multiple BMPs and CBCs.  January 11, a [place] 2  Form was completed for him to come here for rehab. At that point he was needing assistance with bathing dressing, was semiambulatory and has hearing loss.  When seen finally by me today, he was ready for discharge home with family.  Arrangements have been made for [facility] PT, OT thru advance home care and [facility].  He's going to have hospital bed, rolling walker, transport chair, upright  walker and high toilet seat.  meds are to be sent [facility] [address].  F/u with Dr. Moreen Fowler at [facility] on [address] in 1-2 wks.  Past Medical History:  Diagnosis Date  . Allergic rhinitis 09/20/2014  . Anemia, unspecified   . Arthritis   . Atrial flutter ([place])    a. s/p RFCA remotely.  . Autonomic dysfunction   . Bladder neck obstruction   . Cataracts, bilateral   . Coronary atherosclerosis of unspecified type of vessel, native or graft    CABG 2002   . Diminished hearing, bilateral   . Dysthymic disorder   . Fall   . GERD (gastroesophageal reflux disease)   . Heart murmur    as a child  . Impaired glucose tolerance 12/02/2012  . Impotence of organic origin   . Lumbar radiculopathy   . Malignant melanoma of skin of upper limb, including shoulder ([place])   . Old myocardial infarction   . Personal history of malignant melanoma of skin   . Personal history of other diseases of digestive system   . PVC's (premature ventricular contractions)   . Thoracic or lumbosacral neuritis or radiculitis, unspecified   . Unspecified essential hypertension    Past Surgical History:  Procedure Laterality Date  . ARTHRODESIS  07/06/2002   of left long finger distal interphalangeal joint with Kirschner wire fixation X 3  . ATRIAL ABLATION SURGERY  05/04/2001   Dr. [name]  . CARDIAC CATHETERIZATION  09/05/2008   Revealing 4 of 4 patent grafts with native multivessel coronary artery disease, EF  of 60% without regional wall motion abnormalities.  Marland Kitchen CATARACT EXTRACTION, BILATERAL    . CHOLECYSTECTOMY    . COLONOSCOPY    . CORONARY ANGIOPLASTY  1993  . CORONARY ARTERY BYPASS GRAFT  10/17/2000   [name]. [name], [place]     AFTER LIVER TRAUMA AND HAND SURGERY  . EYE SURGERY    . HERNIA REPAIR     BILATERAL  . LEFT HEART CATH AND CORS/GRAFTS ANGIOGRAPHY N/A 04/03/2017   Procedure: LEFT HEART CATH AND CORS/GRAFTS ANGIOGRAPHY;  Surgeon: Leonie Man, MD;  Location: [facility];  Service: Cardiovascular;  Laterality: N/A;  . LUMBAR LAMINECTOMY/DECOMPRESSION MICRODISCECTOMY N/A 02/02/2016   Procedure: MICRODISCECTOMY LUMBAR FOUR- LUMBAR FIVE;  Surgeon: Ashok Pall, MD;  Location: [facility];  Service: Neurosurgery;  Laterality: N/A;  MICRODISCECTOMY L4-L5  . LUMBAR LAMINECTOMY/DECOMPRESSION MICRODISCECTOMY Right 08/02/2016   Procedure: MICRODISCECTOMY LUMBAR FOUR- LUMBAR FIVE RIGHT;  Surgeon: Ashok Pall, MD;  Location:  [facility];  Service: Neurosurgery;  Laterality: Right;  . punctured eardrum      to relieve blood build up   for ear infection  . TONSILLECTOMY      Social History   Socioeconomic History  . Marital status: Married    Spouse name: Not on file  . Number of children: 5  . Years of education: Not on file  . Highest education level: Not on file  Occupational History  . Occupation: retired  Tobacco Use  . Smoking status: Former Smoker    Packs/day: 1.00    Years: 3.00    Pack years: 3.00    Types: Cigarettes    Quit date: 09/21/1955    Years since quitting: 64.7  . Smokeless tobacco: Never Used  Vaping Use  . Vaping Use: Never used  Substance and Sexual Activity  . Alcohol use: Yes    Alcohol/week: 0.0 standard drinks    Comment:  one or two ounces a week  . Drug use: No  . Sexual activity: Not on file  Other Topics Concern  . Not on file  Social History Narrative   Lives with wife in a one story home.     Has 5 children.  (2 are deceased now) Retired Freight forwarder for [employer].     Education: 2 years of college.    Social Determinants of Health   Financial Resource Strain: Not on file  Food Insecurity: Not on file  Transportation Needs: Not on file  Physical Activity: Not on file  Stress: Not on file  Social Connections: Not on file    reports that he quit smoking about 64 years ago. His smoking use included cigarettes. He has a 3.00 pack-year smoking history. He has never used smokeless tobacco. He reports current alcohol use. He reports that he does not use drugs.  Functional Status Survey:    Family History  Problem Relation Age of Onset  . Heart disease Father   . Ataxia Neg Hx   . Chorea Neg Hx   . Dementia Neg Hx   . Mental retardation Neg Hx   . Migraines Neg Hx   . Multiple sclerosis Neg Hx   . Neurofibromatosis Neg Hx   . Neuropathy Neg Hx   . Parkinsonism Neg Hx   . Seizures Neg Hx   . Stroke Neg Hx     Health Maintenance  Topic Date Due  . TETANUS/TDAP   Never done  . COVID-19 Vaccine (4 - Booster for Pfizer series) 08/02/2020  . INFLUENZA VACCINE  Completed  . PNA vac Low Risk Adult  Completed    Allergies  Allergen Reactions  . Iohexol Other (See Comments)     Code: HIVES, Desc: PATENT STATES HE IS ALLERGIC TO IV DYE 09/14/08/RM, Onset Date: [id number]   . Ciprofloxacin Hives and Rash    "Patient unsure" just know he has a reaction to med Medical record indicates allergic reaction  . Other Other (See Comments)    UNSPECIFIED REACTION  Angioplasty dye pt.>> Pt only recalls Had "very bad reaction"    Outpatient Encounter Medications as of 05/26/2020  Medication Sig  . aspirin EC 81 MG tablet Take 81 mg by mouth daily after supper.   . finasteride (PROSCAR) 5 MG tablet Take 1 tablet (5 mg total) by mouth daily after supper.  . Flaxseed, Linseed, (FLAX SEED OIL) 1000 MG CAPS Take 1 capsule by mouth daily at 6 (six) AM.  . gabapentin (NEURONTIN) 300 MG capsule Take 1 capsule (300 mg total) by mouth daily after supper.  . isosorbide mononitrate (IMDUR) 30 MG 24 hr tablet Take 1 tablet (30 mg total) by mouth daily.  . midodrine (PROAMATINE) 10 MG tablet TAKE 1 TABLET BY MOUTH THREE TIMES A DAY  . nitroGLYCERIN (NITROSTAT) 0.4 MG SL tablet Place 1 tablet (0.4 mg total) under the tongue every 5 (five) minutes as needed for chest pain (x 3 doses). Reported on 06/15/2015  . ondansetron (ZOFRAN ODT) 4 MG disintegrating tablet Take 1 tablet (4 mg total) by mouth every 8 (eight) hours as needed for nausea or vomiting.  . rosuvastatin (CRESTOR) 10 MG tablet Take 1 tablet (10 mg total) by mouth daily.  . sertraline (ZOLOFT) 100 MG tablet Take 1 tablet (100 mg total) by mouth daily.  . tamsulosin (FLOMAX) 0.4 MG CAPS capsule Take 0.4 mg by mouth daily. For [facility]  . tiZANidine (ZANAFLEX) 2 MG tablet Take 2 mg by  mouth at bedtime as needed for muscle spasms.  . vitamin B-12 (CYANOCOBALAMIN) 1000 MCG tablet Take 1,000 mcg by mouth daily.  . clotrimazole  (LOTRIMIN) 1 % cream Apply 1 application topically 2 (two) times daily as needed (rash). Groin rash  . esomeprazole (NEXIUM) 20 MG capsule Take 20 mg by mouth daily at 12 noon.  . hydrALAZINE (APRESOLINE) 25 MG tablet Take 50 mg by mouth daily as needed (high bp above 180).  . meclizine (ANTIVERT) 12.5 MG tablet Take 12.5 mg by mouth every 8 (eight) hours as needed. For Vertigo  . OXYGEN Inhale 2 L into the lungs continuous.  . polyethylene glycol (MIRALAX / GLYCOLAX) 17 g packet Take 17 g by mouth 2 (two) times daily.  . traMADol HCl 100 MG TABS Take 100 mg by mouth 3 (three) times daily. For Back/ Rib Pain  . [DISCONTINUED] lidocaine (LIDODERM) 5 % Place 1 patch onto the skin daily. Remove & Discard patch within 12 hours or as directed by MD   No facility-administered encounter medications on file as of 05/26/2020.    Review of Systems  Constitutional: Positive for malaise/fatigue. Negative for chills and fever.  HENT: Negative for congestion and sore throat.   Eyes: Negative for blurred vision.  Respiratory: Negative for cough and shortness of breath.        Hypoxic respiratory failure  Cardiovascular: Negative for chest pain, palpitations and leg swelling.  Gastrointestinal: Negative for abdominal pain.  Genitourinary: Negative for dysuria.  Musculoskeletal: Positive for back pain.       Able to do 2 laps around unit before he gets too achy in his back  Neurological: Positive for weakness. Negative for headaches.  Endo/Heme/Allergies: Bruises/bleeds easily.  Psychiatric/Behavioral:       Failure to thrive, decreased appetite    Vitals:   05/26/20 1049  BP: 130/72  Pulse: 86  Temp: (!) 97.5 F (36.4 C)  Weight: 168 lb 1.6 oz (76.2 kg)  Height: 5\' 9"  (1.753 m)   Body mass index is 24.82 kg/m. Physical Exam Vitals reviewed.  Constitutional:      General: He is not in acute distress.    Appearance: Normal appearance. He is not toxic-appearing.     Comments: Thin male   Cardiovascular:     Rate and Rhythm: Normal rate and regular rhythm.     Pulses: Normal pulses.     Heart sounds: Normal heart sounds.  Pulmonary:     Effort: Pulmonary effort is normal.     Breath sounds: Normal breath sounds. No rales.     Comments: Wearing 2L via [place] Abdominal:     General: Bowel sounds are normal.     Palpations: Abdomen is soft.     Tenderness: There is no abdominal tenderness.  Musculoskeletal:        General: Normal range of motion.     Right lower leg: No edema.     Left lower leg: No edema.  Skin:    General: Skin is warm and dry.  Neurological:     Mental Status: He is alert.     Motor: Weakness present.     Gait: Gait abnormal.     Comments: Using wheelchair     Labs reviewed: Basic Metabolic Panel: Recent Labs    08/13/19 0247 08/15/19 1725 04/28/20 0459 05/03/20 1112 05/06/20 1942  NA  --    < > 139 139 139  K  --    < > 4.7 4.8 4.5  CL  --    < >  106 104 105  CO2  --    < > 25 28 26   GLUCOSE  --    < > 143* 179* 137*  BUN  --    < > 28* 22 21  CREATININE  --    < > 0.81 0.77 0.95  CALCIUM  --    < > 9.0 9.7 8.7*  MG 2.0  --   --  2.2  --    < > = values in this interval not displayed.   Liver Function Tests: Recent Labs    08/11/19 0947 08/15/19 1725 05/06/20 1942  AST 24 23 26   ALT 26 23 20   ALKPHOS 75 43 56  BILITOT 0.5 0.9 0.6  PROT 6.3 5.7* 5.8*  ALBUMIN 4.5 3.4* 3.3*   No results for input(s): LIPASE, AMYLASE in the last 8760 hours. No results for input(s): AMMONIA in the last 8760 hours. CBC: Recent Labs    08/19/19 0000 02/22/20 1246 04/26/20 1717 04/27/20 0732 04/28/20 0459 05/03/20 1112 05/06/20 1942  WBC 9.7   < > 14.2*   < > 10.5 12.7* 10.9*  NEUTROABS 7  --  11.8*  --   --   --  8.5*  HGB 14.8   < > 14.9   < > 12.2* 13.3 12.5*  HCT 44   < > 43.0   < > 36.8* 39.7 38.1*  MCV  --    < > 86.7   < > 89.3 88 90.3  PLT 149*   < > 167   < > 185 198 196   < > = values in this interval not displayed.    Cardiac Enzymes: No results for input(s): CKTOTAL, CKMB, CKMBINDEX, TROPONINI in the last 8760 hours. BNP: Invalid input(s): POCBNP Lab Results  Component Value Date   HGBA1C 6.4 (H) 08/13/2019   Lab Results  Component Value Date   TSH 2.020 05/03/2020   Lab Results  Component Value Date   VITAMINB12 161 (L) 08/13/2019   VITAMINB12 153 (L) 08/13/2019   Lab Results  Component Value Date   FOLATE 10.1 08/13/2019   No results found for: IRON, TIBC, FERRITIN  Imaging and Procedures obtained prior to SNF admission: DG Chest Port 1 View  Result Date: 05/06/2020 CLINICAL DATA:  Hypotension, nausea EXAM: PORTABLE CHEST 1 VIEW COMPARISON:  May 03, 2020 FINDINGS: The cardiomediastinal silhouette is unchanged in contour.Status post median sternotomy and CABG. Atherosclerotic calcifications of the aorta. Unchanged elevation of the RIGHT hemidiaphragm. Low lung volumes with bronchovascular crowding. No pleural effusion. No pneumothorax. Unchanged LEFT retrocardiac opacities likely reflecting scarring/atelectasis. Unchanged linear RIGHT basilar opacities consistent with atelectasis. No new focal opacity. Visualized abdomen is unremarkable. Multilevel degenerative changes of the thoracic spine. IMPRESSION: Bibasilar atelectasis versus scarring. Electronically Signed   By: Valentino Saxon MD   On: 05/06/2020 20:06    Assessment/Plan 1. Left lumbar radiculopathy -was planning on back surgery, but fall and rib fxs related to vertigo delayed this -he's failing to thrive so a back surgery may not be the best idea for him--family supportive and will cont HH therapy at home; getting hospital bed, transport chair, rolling walker, upright walker, high toilet seat DME  2. Essential hypertension -controlled with current regimen, cont same and monitor  3. Coronary artery disease involving native coronary artery of native heart without angina pectoris -no active symptoms, cont home regimen and  monitor  4. BPH associated with nocturia -cont current regimen and monitor  5. Postural dizziness with presyncope -  improved, but must improved hydration and intake--he feels he will do much better at home  6. BPPV (benign paroxysmal positional vertigo), right -continue therapy at home, but improved  7. Hypoxia -continuing to have O2 requirement with exercise--dropping into 80s w/o O2 so on 2L via [place]  8. Closed fracture of multiple ribs of left side with routine healing, subsequent encounter -gradually recovering, minimal remaining pain here, cont IS use   9. DNR (do not resuscitate) -DNR confirmed  No meds needed as unchanged during rehab stay.  Family/ staff Communication: daughter and wife present for visit  Labs/tests ordered:  F/u in 1-2 wks with Dr. Moreen Fowler at [facility]. Walta [place], D.O. [facility] [address], [place] [address] Cell Phone (Mon-Fri 8am-5pm):  [phone] On Call:  [phone] & follow prompts after 5pm & weekends Office Phone:  [phone] Office Fax:  [phone]

## 2020-05-27 ENCOUNTER — Emergency Department (HOSPITAL_COMMUNITY)
Admission: EM | Admit: 2020-05-27 | Discharge: 2020-05-27 | Disposition: A | Discharge status: Home / Self Care | Payer: Medicare Other | Attending: Emergency Medicine | Admitting: Emergency Medicine

## 2020-05-27 ENCOUNTER — Emergency Department (HOSPITAL_COMMUNITY): Payer: Medicare Other

## 2020-05-27 ENCOUNTER — Other Ambulatory Visit: Payer: Self-pay

## 2020-05-27 ENCOUNTER — Encounter (HOSPITAL_COMMUNITY): Payer: Self-pay

## 2020-05-27 DIAGNOSIS — Z79899 Other long term (current) drug therapy: Secondary | ICD-10-CM | POA: Diagnosis not present

## 2020-05-27 DIAGNOSIS — Y92009 Unspecified place in unspecified non-institutional (private) residence as the place of occurrence of the external cause: Secondary | ICD-10-CM | POA: Insufficient documentation

## 2020-05-27 DIAGNOSIS — M545 Low back pain, unspecified: Secondary | ICD-10-CM | POA: Diagnosis not present

## 2020-05-27 DIAGNOSIS — M25531 Pain in right wrist: Secondary | ICD-10-CM | POA: Diagnosis not present

## 2020-05-27 DIAGNOSIS — S20211A Contusion of right front wall of thorax, initial encounter: Secondary | ICD-10-CM

## 2020-05-27 DIAGNOSIS — N189 Chronic kidney disease, unspecified: Secondary | ICD-10-CM | POA: Diagnosis not present

## 2020-05-27 DIAGNOSIS — S299XXA Unspecified injury of thorax, initial encounter: Secondary | ICD-10-CM | POA: Diagnosis present

## 2020-05-27 DIAGNOSIS — I25810 Atherosclerosis of coronary artery bypass graft(s) without angina pectoris: Secondary | ICD-10-CM | POA: Insufficient documentation

## 2020-05-27 DIAGNOSIS — Z87891 Personal history of nicotine dependence: Secondary | ICD-10-CM | POA: Diagnosis not present

## 2020-05-27 DIAGNOSIS — I2510 Atherosclerotic heart disease of native coronary artery without angina pectoris: Secondary | ICD-10-CM | POA: Insufficient documentation

## 2020-05-27 DIAGNOSIS — W010XXA Fall on same level from slipping, tripping and stumbling without subsequent striking against object, initial encounter: Secondary | ICD-10-CM | POA: Insufficient documentation

## 2020-05-27 DIAGNOSIS — I129 Hypertensive chronic kidney disease with stage 1 through stage 4 chronic kidney disease, or unspecified chronic kidney disease: Secondary | ICD-10-CM | POA: Diagnosis not present

## 2020-05-27 DIAGNOSIS — S0990XA Unspecified injury of head, initial encounter: Secondary | ICD-10-CM | POA: Insufficient documentation

## 2020-05-27 NOTE — ED Provider Notes (Signed)
[facility] DEPT Provider Note   CSN: [id number] Arrival date & time: 05/27/20  0046     History Chief Complaint  Patient presents with  . Fall    Troy Combs is a 87 y.o. male.  Patient presents to the emergency department for evaluation after a fall.  Patient reports that he got up to go to the bathroom, tripped and fell.  He reports falling onto his right side.  He is complaining of pain in his right ribs, right wrist and middle portion of his back since the fall.  He did hit his head, did not lose consciousness.  Does not use blood thinners.        Past Medical History:  Diagnosis Date  . Allergic rhinitis 09/20/2014  . Anemia, unspecified   . Arthritis   . Atrial flutter ([place])    a. s/p RFCA remotely.  . Autonomic dysfunction   . Bladder neck obstruction   . Cataracts, bilateral   . Coronary atherosclerosis of unspecified type of vessel, native or graft    CABG 2002  . Diminished hearing, bilateral   . Dysthymic disorder   . Fall   . GERD (gastroesophageal reflux disease)   . Heart murmur    as a child  . Impaired glucose tolerance 12/02/2012  . Impotence of organic origin   . Lumbar radiculopathy   . Malignant melanoma of skin of upper limb, including shoulder ([place])   . Old myocardial infarction   . Personal history of malignant melanoma of skin   . Personal history of other diseases of digestive system   . PVC's (premature ventricular contractions)   . Thoracic or lumbosacral neuritis or radiculitis, unspecified   . Unspecified essential hypertension     Patient Active Problem List   Diagnosis Date Noted  . Multiple closed fractures of ribs of left side 04/27/2020  . Fall 04/27/2020  . Intractable pain 04/27/2020  . Rib fractures 04/26/2020  . Postural dizziness with presyncope 08/13/2019  . Hypertensive urgency 08/12/2019  . Impacted cerumen of both ears 08/12/2019  . Personal history of malignant melanoma of skin    . Lumbar radiculopathy   . Heart murmur   . GERD (gastroesophageal reflux disease)   . Diminished hearing, bilateral   . Cataracts, bilateral   . Bladder neck obstruction   . Arthritis   . Constipation 12/12/2016  . Nausea 12/12/2016  . Coronary artery disease involving native coronary artery of native heart without angina pectoris 11/22/2016  . Dyslipidemia 11/22/2016  . History of coronary artery bypass surgery 11/22/2016  . Depression with anxiety   . Orthostasis 09/20/2016  . HNP (herniated nucleus pulposus), lumbar 02/02/2016  . Spinal stenosis of lumbar region 01/24/2016  . Sciatica, right side 01/24/2016  . Prediabetes 01/24/2016  . Otitis, externa, infective 01/24/2016  . Left lumbar radiculopathy 01/15/2016  . PVC's (premature ventricular contractions) 05/24/2015  . Peripheral polyneuropathy 03/06/2015  . BPPV (benign paroxysmal positional vertigo), right 03/06/2015  . Headache 01/18/2015  . Vertigo 01/18/2015  . Allergic rhinitis 09/20/2014  . Dyspnea on exertion 08/10/2014  . Severe dizziness 08/10/2014  . Diarrhea 05/18/2014  . LLQ pain 05/13/2014  . Chronic meniscal tear of knee 09/22/2013  . Primary localized osteoarthrosis, lower leg 09/22/2013  . Neck pain, bilateral 12/18/2012  . Headache(784.0) 12/18/2012  . Binocular visual disturbance 12/18/2012  . Epiretinal membrane 12/17/2012  . Status post intraocular lens implant 12/17/2012  . Preventative health care 12/02/2012  . Low back  pain 12/02/2012  . Impaired glucose tolerance 12/02/2012  . Palpitations 10/09/2010  . B12 deficiency 08/30/2010  . Hearing loss 08/30/2010  . Tinnitus 08/30/2010  . Neurogenic orthostatic hypotension ([facility]) 06/28/2009  . Mixed hyperlipidemia 05/15/2009  . CHEST PAIN UNSPECIFIED 11/23/2008  . Hypotension, unspecified 10/12/2008  . DIZZINESS 10/12/2008  . Shortness of breath 09/14/2008  . UNSPECIFIED ANEMIA 09/13/2008  . ANXIETY DEPRESSION 09/13/2008  . Old myocardial  infarction 09/13/2008  . GASTROESOPHAGEAL REFLUX DISEASE, HX OF 09/13/2008  . LUMBAR RADICULOPATHY, LEFT 08/23/2008  . MELANOMA, SHOULDER 11/27/2006  . Essential hypertension 11/27/2006  . Coronary atherosclerosis 11/27/2006  . BPH associated with nocturia 11/27/2006  . ERECTILE DYSFUNCTION, ORGANIC 11/27/2006    Past Surgical History:  Procedure Laterality Date  . ARTHRODESIS  07/06/2002   of left long finger distal interphalangeal joint with Kirschner wire fixation X 3  . ATRIAL ABLATION SURGERY  05/04/2001   Dr. Lewayne BuntingGregg Taylor  . CARDIAC CATHETERIZATION  09/05/2008   Revealing 4 of 4 patent grafts with native multivessel coronary artery disease, EF  of 60% without regional wall motion abnormalities.  Marland Kitchen. CATARACT EXTRACTION, BILATERAL    . CHOLECYSTECTOMY    . COLONOSCOPY    . CORONARY ANGIOPLASTY  1993  . CORONARY ARTERY BYPASS GRAFT  10/17/2000   Gwenith DailyEdward B. Tyrone SageGerhardt, MD  . EXPLORATORY LAPAROTOMY     AFTER LIVER TRAUMA AND HAND SURGERY  . EYE SURGERY    . HERNIA REPAIR     BILATERAL  . LEFT HEART CATH AND CORS/GRAFTS ANGIOGRAPHY N/A 04/03/2017   Procedure: LEFT HEART CATH AND CORS/GRAFTS ANGIOGRAPHY;  Surgeon: Marykay LexHarding, David W, MD;  [facility];  Service: Cardiovascular;  Laterality: N/A;  . LUMBAR LAMINECTOMY/DECOMPRESSION MICRODISCECTOMY N/A 02/02/2016   Procedure: MICRODISCECTOMY LUMBAR FOUR- LUMBAR FIVE;  Surgeon: Coletta MemosKyle Cabbell, MD;  Location: [facility] OR;  Service: Neurosurgery;  Laterality: N/A;  MICRODISCECTOMY L4-L5  . LUMBAR LAMINECTOMY/DECOMPRESSION MICRODISCECTOMY Right 08/02/2016   Procedure: MICRODISCECTOMY LUMBAR FOUR- LUMBAR FIVE RIGHT;  Surgeon: Coletta MemosKyle Cabbell, MD;  Location: [facility] OR;  Service: Neurosurgery;  Laterality: Right;  . punctured eardrum      to relieve blood build up   for ear infection  . TONSILLECTOMY         Family History  Problem Relation Age of Onset  . Heart disease Father   . Ataxia Neg Hx   . Chorea Neg Hx   . Dementia Neg Hx   .  Mental retardation Neg Hx   . Migraines Neg Hx   . Multiple sclerosis Neg Hx   . Neurofibromatosis Neg Hx   . Neuropathy Neg Hx   . Parkinsonism Neg Hx   . Seizures Neg Hx   . Stroke Neg Hx     Social History   Tobacco Use  . Smoking status: Former Smoker    Packs/day: 1.00    Years: 3.00    Pack years: 3.00    Types: Cigarettes    Quit date: 09/21/1955    Years since quitting: 64.7  . Smokeless tobacco: Never Used  Vaping Use  . Vaping Use: Never used  Substance Use Topics  . Alcohol use: Yes    Alcohol/week: 0.0 standard drinks    Comment: one or two ounces a week  . Drug use: No    Home Medications Prior to Admission medications   Medication Sig Start Date End Date Taking? Authorizing Provider  aspirin EC 81 MG tablet Take 81 mg by mouth daily after supper.  Yes [provider]  esomeprazole (NEXIUM) 20 MG capsule Take 20 mg by mouth daily at 12 noon.   Yes [provider]  finasteride (PROSCAR) 5 MG tablet Take 1 tablet (5 mg total) by mouth daily after supper. 09/03/19  Yes Lassen, Arlo C, PA-C  Flaxseed, Linseed, (FLAX SEED OIL) 1000 MG CAPS Take 1 capsule by mouth daily at 6 (six) AM.   Yes [provider]  gabapentin (NEURONTIN) 300 MG capsule Take 1 capsule (300 mg total) by mouth daily after supper. 04/28/20  Yes Rai, Ripudeep K, MD  isosorbide mononitrate (IMDUR) 30 MG 24 hr tablet Take 1 tablet (30 mg total) by mouth daily. 04/28/20 07/27/20 Yes Rai, Ripudeep K, MD  midodrine (PROAMATINE) 10 MG tablet TAKE 1 TABLET BY MOUTH THREE TIMES A DAY 10/29/19  Yes Dorothy Spark, MD  OXYGEN Inhale 2 L into the lungs continuous.   Yes [provider]  polyethylene glycol (MIRALAX / GLYCOLAX) 17 g packet Take 17 g by mouth 2 (two) times daily.   Yes [provider]  rosuvastatin (CRESTOR) 10 MG tablet Take 1 tablet (10 mg total) by mouth daily. 09/03/19  Yes Lassen, Arlo C, PA-C  sertraline (ZOLOFT) 100 MG tablet Take 1 tablet  (100 mg total) by mouth daily. 09/03/19  Yes Lassen, Arlo C, PA-C  tiZANidine (ZANAFLEX) 2 MG tablet Take 2 mg by mouth 2 (two) times daily as needed for muscle spasms.   Yes [provider]  traMADol HCl 100 MG TABS Take 100 mg by mouth 3 (three) times daily. For Back/ Rib Pain 05/16/20  Yes Fargo, Amy E, NP  vitamin B-12 (CYANOCOBALAMIN) 1000 MCG tablet Take 1,000 mcg by mouth daily.   Yes [provider]  clotrimazole (LOTRIMIN) 1 % cream Apply 1 application topically 2 (two) times daily as needed (rash). Groin rash    [provider]  hydrALAZINE (APRESOLINE) 25 MG tablet Take 50 mg by mouth daily as needed (high bp above 180).    [provider]  lisinopril (ZESTRIL) 5 MG tablet Take 5 mg by mouth daily. Patient not taking: Reported on 05/27/2020    [provider]  meclizine (ANTIVERT) 12.5 MG tablet Take 12.5 mg by mouth every 8 (eight) hours as needed. For Vertigo Patient not taking: Reported on 05/27/2020    [provider]  nitroGLYCERIN (NITROSTAT) 0.4 MG SL tablet Place 1 tablet (0.4 mg total) under the tongue every 5 (five) minutes as needed for chest pain (x 3 doses). Reported on 06/15/2015 Patient taking differently: Place 0.4 mg under the tongue every 5 (five) minutes as needed for chest pain (x 3 doses). Has for emergency chest pains 09/03/19   Granville Lewis C, PA-C  ondansetron (ZOFRAN ODT) 4 MG disintegrating tablet Take 1 tablet (4 mg total) by mouth every 8 (eight) hours as needed for nausea or vomiting. Patient not taking: Reported on 05/27/2020 04/28/20   Mendel Corning, MD    Allergies    Iohexol, Ciprofloxacin, and Other  Review of Systems   Review of Systems  Musculoskeletal: Positive for arthralgias and back pain.  All other systems reviewed and are negative.   Physical Exam Updated Vital Signs BP 125/73   Pulse 86   Temp 97.8 F (36.6 C) (Oral)   Resp 15   SpO2 98%   Physical Exam Vitals and nursing note  reviewed.  Constitutional:      General: He is not in acute distress.    Appearance:  Normal appearance. He is well-developed and well-nourished.  HENT:     Head: Normocephalic and atraumatic.     Right Ear: Hearing normal.     Left Ear: Hearing normal.     Nose: Nose normal.     Mouth/Throat:     Mouth: Oropharynx is clear and moist and mucous membranes are normal.  Eyes:     Extraocular Movements: EOM normal.     Conjunctiva/sclera: Conjunctivae normal.     Pupils: Pupils are equal, round, and reactive to light.  Cardiovascular:     Rate and Rhythm: Regular rhythm.     Heart sounds: S1 normal and S2 normal. No murmur heard. No friction rub. No gallop.   Pulmonary:     Effort: Pulmonary effort is normal. No respiratory distress.     Breath sounds: Normal breath sounds.  Chest:     Chest wall: Tenderness present. No swelling or crepitus.    Abdominal:     General: Bowel sounds are normal.     Palpations: Abdomen is soft. There is no hepatosplenomegaly.     Tenderness: There is no abdominal tenderness. There is no guarding or rebound. Negative signs include Murphy's sign and McBurney's sign.     Hernia: No hernia is present.  Musculoskeletal:        General: Normal range of motion.     Right shoulder: Normal.     Right upper arm: Normal.     Right elbow: Normal.     Right wrist: Tenderness present. No swelling or deformity. Normal range of motion.     Cervical back: Normal range of motion and neck supple.     Thoracic back: Tenderness present.       Back:  Skin:    General: Skin is warm, dry and intact.     Findings: No rash.     Nails: There is no cyanosis.  Neurological:     Mental Status: He is alert and oriented to person, place, and time.     GCS: GCS eye subscore is 4. GCS verbal subscore is 5. GCS motor subscore is 6.     Cranial Nerves: No cranial nerve deficit.     Sensory: No sensory deficit.     Coordination: Coordination normal.     Deep Tendon Reflexes:  Strength normal.  Psychiatric:        Mood and Affect: Mood and affect normal.        Speech: Speech normal.        Behavior: Behavior normal.        Thought Content: Thought content normal.     ED Results / Procedures / Treatments   Labs (all labs ordered are listed, but only abnormal results are displayed) Labs Reviewed - No data to display  EKG None  Radiology DG Ribs Unilateral W/Chest Right  Result Date: 05/27/2020 CLINICAL DATA:  Recent fall with right-sided chest pain, initial encounter EXAM: RIGHT RIBS AND CHEST - 3+ VIEW COMPARISON:  None. FINDINGS: No acute rib fractures are noted. No pneumothorax is seen. Calcifications are noted along the liver margin stable from prior CT. Cardiac shadow is enlarged. Aortic calcifications are seen. IMPRESSION: No rib fractures are noted. Electronically Signed   By: Inez Catalina M.D.   On: 05/27/2020 02:40   DG Thoracic Spine 2 View  Result Date: 05/27/2020 CLINICAL DATA:  Recent fall with upper back pain, initial encounter EXAM: THORACIC SPINE 2 VIEWS COMPARISON:  None. FINDINGS: Vertebral body height is well maintained. Mild  osteophytic changes are noted. No paraspinal mass is noted. No rib abnormality is seen. IMPRESSION: Degenerative change without acute abnormality. Electronically Signed   By: Inez Catalina M.D.   On: 05/27/2020 02:37   DG Lumbar Spine Complete  Result Date: 05/27/2020 CLINICAL DATA:  Recent fall with low back pain, initial encounter EXAM: LUMBAR SPINE - COMPLETE 4+ VIEW COMPARISON:  10/20/2018 FINDINGS: Five lumbar type vertebral bodies are well visualized. Vertebral body height is well maintained. Mild osteophytic changes are noted. Facet hypertrophic changes are noted as well. Anterolisthesis of L4 on L5 is noted. Aortic calcifications are seen. IMPRESSION: Degenerative change stable in appearance from the prior exam. No acute abnormality is noted. Electronically Signed   By: Inez Catalina M.D.   On: 05/27/2020 02:36    DG Wrist Complete Right  Result Date: 05/27/2020 CLINICAL DATA:  Recent fall with right wrist pain, initial encounter EXAM: RIGHT WRIST - COMPLETE 3+ VIEW COMPARISON:  None. FINDINGS: Carpal degenerative changes are noted. No acute fracture or dislocation is noted. Vascular calcifications are seen. IMPRESSION: Degenerative changes are noted without acute abnormality. Electronically Signed   By: Inez Catalina M.D.   On: 05/27/2020 02:39   CT HEAD WO CONTRAST  Result Date: 05/27/2020 CLINICAL DATA:  Status post fall EXAM: CT HEAD WITHOUT CONTRAST TECHNIQUE: Contiguous axial images were obtained from the base of the skull through the vertex without intravenous contrast. COMPARISON:  CT head 04/26/2020 FINDINGS: Brain: Cerebral ventricle sizes are concordant with the degree of cerebral volume loss. Patchy and confluent areas of decreased attenuation are noted throughout the deep and periventricular white matter of the cerebral hemispheres bilaterally, compatible with chronic microvascular ischemic disease. No evidence of large-territorial acute infarction. No parenchymal hemorrhage. No mass lesion. No extra-axial collection. No mass effect or midline shift. No hydrocephalus. Basilar cisterns are patent. Vascular: No hyperdense vessel. Atherosclerotic calcifications are present within the cavernous internal carotid and vertebral arteries. Skull: No acute fracture or focal lesion. Sinuses/Orbits: Paranasal sinuses and mastoid air cells are clear. Bilateral lens replacement. Otherwise the orbits are unremarkable. Other: None. IMPRESSION: No acute intracranial abnormality. Electronically Signed   By: Iven Finn M.D.   On: 05/27/2020 01:55    Procedures Procedures   Medications Ordered in ED Medications - No data to display  ED Course  I have reviewed the triage vital signs and the nursing notes.  Pertinent labs & imaging results that were available during my care of the patient were reviewed by me  and considered in my medical decision making (see chart for details).    MDM Rules/Calculators/A&P                         Patient presents to the emergency department for evaluation after a fall.  Patient reports that he got up out of bed to go to the bathroom and lost his balance.  Patient was in a skilled nursing facility for rehab until noon today.  He is not supposed to be ambulating on his own.  CT head performed and is negative.  X-ray right ribs, right wrist, spinal x-rays are all negative.  Discussed disposition with patient and wife.  It sounds as though he does not qualify for any further rehab after being discharged today.  Wife thinks that he is getting physical therapy set up for home.  Offered to have patient stay in the department until morning when case management/social work can further evaluate.  Patient would prefer to  be discharged home.  Will place consult to make sure that he is getting all home health help that he qualifies for. Final Clinical Impression(s) / ED Diagnoses Final diagnoses:  Contusion of right front wall of thorax, initial encounter    Rx / DC Orders ED Discharge Orders    None       Caressa Scearce, Gwenyth Allegra, MD 05/27/20 [phone]

## 2020-05-27 NOTE — ED Notes (Signed)
PTAR present

## 2020-05-27 NOTE — ED Triage Notes (Signed)
Pt arrives EMS from home after fall c/o lower back pain. Pt lives with wife. Requires a walker to ambulate but rarely uses it. 2L [place] at baseline.

## 2020-05-27 NOTE — ED Notes (Signed)
PTAR notified for pt transfer to residence.

## 2020-05-29 ENCOUNTER — Emergency Department (HOSPITAL_COMMUNITY): Payer: Medicare Other

## 2020-05-29 ENCOUNTER — Emergency Department (HOSPITAL_COMMUNITY)
Admission: EM | Admit: 2020-05-29 | Discharge: 2020-05-30 | Disposition: A | Discharge status: Home / Self Care | Payer: Medicare Other | Attending: Emergency Medicine | Admitting: Emergency Medicine

## 2020-05-29 ENCOUNTER — Encounter (HOSPITAL_COMMUNITY): Payer: Self-pay

## 2020-05-29 ENCOUNTER — Other Ambulatory Visit: Payer: Self-pay

## 2020-05-29 DIAGNOSIS — R519 Headache, unspecified: Secondary | ICD-10-CM | POA: Diagnosis present

## 2020-05-29 DIAGNOSIS — Z87891 Personal history of nicotine dependence: Secondary | ICD-10-CM | POA: Insufficient documentation

## 2020-05-29 DIAGNOSIS — R262 Difficulty in walking, not elsewhere classified: Secondary | ICD-10-CM

## 2020-05-29 DIAGNOSIS — Z20822 Contact with and (suspected) exposure to covid-19: Secondary | ICD-10-CM | POA: Insufficient documentation

## 2020-05-29 DIAGNOSIS — I2510 Atherosclerotic heart disease of native coronary artery without angina pectoris: Secondary | ICD-10-CM | POA: Insufficient documentation

## 2020-05-29 DIAGNOSIS — Z85828 Personal history of other malignant neoplasm of skin: Secondary | ICD-10-CM | POA: Insufficient documentation

## 2020-05-29 DIAGNOSIS — Z951 Presence of aortocoronary bypass graft: Secondary | ICD-10-CM | POA: Insufficient documentation

## 2020-05-29 DIAGNOSIS — I10 Essential (primary) hypertension: Secondary | ICD-10-CM | POA: Diagnosis not present

## 2020-05-29 DIAGNOSIS — Z79899 Other long term (current) drug therapy: Secondary | ICD-10-CM | POA: Diagnosis not present

## 2020-05-29 DIAGNOSIS — R1110 Vomiting, unspecified: Secondary | ICD-10-CM | POA: Diagnosis not present

## 2020-05-29 DIAGNOSIS — I951 Orthostatic hypotension: Secondary | ICD-10-CM | POA: Diagnosis not present

## 2020-05-29 DIAGNOSIS — M542 Cervicalgia: Secondary | ICD-10-CM | POA: Diagnosis not present

## 2020-05-29 DIAGNOSIS — Z7982 Long term (current) use of aspirin: Secondary | ICD-10-CM | POA: Insufficient documentation

## 2020-05-29 LAB — URINALYSIS, ROUTINE W REFLEX MICROSCOPIC
Bilirubin Urine: NEGATIVE
Glucose, UA: NEGATIVE mg/dL
Hgb urine dipstick: NEGATIVE
Ketones, ur: NEGATIVE mg/dL
Leukocytes,Ua: NEGATIVE
Nitrite: NEGATIVE
Protein, ur: NEGATIVE mg/dL
Specific Gravity, Urine: 1.02 (ref 1.005–1.030)
pH: 6 (ref 5.0–8.0)

## 2020-05-29 LAB — CBC WITH DIFFERENTIAL/PLATELET
Abs Immature Granulocytes: 0.13 10*3/uL — ABNORMAL HIGH (ref 0.00–0.07)
Basophils Absolute: 0.1 10*3/uL (ref 0.0–0.1)
Basophils Relative: 0 %
Eosinophils Absolute: 0.4 10*3/uL (ref 0.0–0.5)
Eosinophils Relative: 4 %
HCT: 41.4 % (ref 39.0–52.0)
Hemoglobin: 13.9 g/dL (ref 13.0–17.0)
Immature Granulocytes: 1 %
Lymphocytes Relative: 6 %
Lymphs Abs: 0.7 10*3/uL (ref 0.7–4.0)
MCH: 29.6 pg (ref 26.0–34.0)
MCHC: 33.6 g/dL (ref 30.0–36.0)
MCV: 88.1 fL (ref 80.0–100.0)
Monocytes Absolute: 1 10*3/uL (ref 0.1–1.0)
Monocytes Relative: 9 %
Neutro Abs: 9.1 10*3/uL — ABNORMAL HIGH (ref 1.7–7.7)
Neutrophils Relative %: 80 %
Platelets: 148 10*3/uL — ABNORMAL LOW (ref 150–400)
RBC: 4.7 MIL/uL (ref 4.22–5.81)
RDW: 14.3 % (ref 11.5–15.5)
WBC: 11.5 10*3/uL — ABNORMAL HIGH (ref 4.0–10.5)
nRBC: 0 % (ref 0.0–0.2)

## 2020-05-29 LAB — COMPREHENSIVE METABOLIC PANEL
ALT: 19 U/L (ref 0–44)
AST: 18 U/L (ref 15–41)
Albumin: 3.2 g/dL — ABNORMAL LOW (ref 3.5–5.0)
Alkaline Phosphatase: 48 U/L (ref 38–126)
Anion gap: 10 (ref 5–15)
BUN: 15 mg/dL (ref 8–23)
CO2: 25 mmol/L (ref 22–32)
Calcium: 8.8 mg/dL — ABNORMAL LOW (ref 8.9–10.3)
Chloride: 103 mmol/L (ref 98–111)
Creatinine, Ser: 0.74 mg/dL (ref 0.61–1.24)
GFR, Estimated: >60 mL/min (ref >60)
Glucose, Bld: 112 mg/dL — ABNORMAL HIGH (ref 70–99)
Potassium: 3.8 mmol/L (ref 3.5–5.1)
Sodium: 138 mmol/L (ref 135–145)
Total Bilirubin: 1.2 mg/dL (ref 0.3–1.2)
Total Protein: 5.7 g/dL — ABNORMAL LOW (ref 6.5–8.1)

## 2020-05-29 LAB — SARS CORONAVIRUS 2 (TAT 6-24 HRS): SARS Coronavirus 2: NEGATIVE

## 2020-05-29 MED ORDER — FINASTERIDE 5 MG PO TABS
5.0000 mg | ORAL_TABLET | Freq: Every day | ORAL | Status: DC
  Administered 2020-05-29: 5 mg via ORAL
  Filled 2020-05-29: qty 1

## 2020-05-29 MED ORDER — ISOSORBIDE MONONITRATE ER 30 MG PO TB24
30.0000 mg | ORAL_TABLET | Freq: Every day | ORAL | Status: DC
  Administered 2020-05-29: 30 mg via ORAL
  Filled 2020-05-29: qty 1

## 2020-05-29 MED ORDER — ASPIRIN EC 81 MG PO TBEC
81.0000 mg | DELAYED_RELEASE_TABLET | Freq: Every day | ORAL | Status: DC
  Administered 2020-05-29: 81 mg via ORAL
  Filled 2020-05-29: qty 1

## 2020-05-29 MED ORDER — HYDRALAZINE HCL 25 MG PO TABS: 50.0000 mg | ORAL_TABLET | Freq: Every day | ORAL | Status: DC | PRN

## 2020-05-29 MED ORDER — PANTOPRAZOLE SODIUM 40 MG PO TBEC
40.0000 mg | DELAYED_RELEASE_TABLET | Freq: Every day | ORAL | Status: DC
  Administered 2020-05-29: 40 mg via ORAL
  Filled 2020-05-29: qty 1

## 2020-05-29 MED ORDER — LACTATED RINGERS IV BOLUS
1000.0000 mL | Freq: Once | INTRAVENOUS | Status: AC
  Administered 2020-05-29: 1000 mL via INTRAVENOUS

## 2020-05-29 MED ORDER — CLOTRIMAZOLE 1 % EX CREA
1.0000 "application " | TOPICAL_CREAM | Freq: Two times a day (BID) | CUTANEOUS | Status: DC | PRN
  Filled 2020-05-29: qty 15

## 2020-05-29 NOTE — ED Notes (Signed)
RN updated Troy Combs, daughter

## 2020-05-29 NOTE — Discharge Planning (Signed)
RNCM consulted regarding safe discharge planning (Home with Home Health vs Skilled Nursing Facility Placement).  Physical Therapy evaluation placed; will follow up after recommendations from PT.     

## 2020-05-29 NOTE — Care Management (Signed)
ED RNCM faxed addition [place] orders HHRN for medication management, and HHA, SW  into [facility] via [facility].

## 2020-05-29 NOTE — ED Triage Notes (Signed)
Pt comes via [facility], pt has had several falls over the past few weeks, one on Friday, CT scan was negative, pt has had a worsening headache since. Neuro intact bilaterally. PCP wants him rechecked

## 2020-05-29 NOTE — Progress Notes (Addendum)
TOC team met with family at bedside as well as family's pastor. TOC team also spoke with daughter Izora Gala via phone to gather collateral information. With Nancy's permission, CSW spoke with Lexine Baton at [facility] about Pt's recent SNF stay. Pt received 14 days of SNF care and was discharged due to insurance review.  [facility] currently has no beds available and placing Pt would be very difficult due to recent discharge. Family is already working to apply for Medicaid.  TOC team updated daughter Izora Gala to explain that placement for SNF is not an option at this time.  Pt already has [facility] for PT/OT.   TOC team will work at Administrator, sports as well as SW services to [employer]. Daughter verbalized understanding and appreciation.

## 2020-05-29 NOTE — ED Provider Notes (Signed)
Follow-up labs, orthostatic, needs hydration, anticipate admission Physical Exam  BP (!) 195/91   Pulse 83   Temp 98.3 F (36.8 C)   Resp (!) 24   SpO2 99%   Physical Exam  ED Course/Procedures     Procedures  MDM  Diagnostic studies no acute findings. Reviewed with patient and wife. Difficulty with home care due to frequent falls and weakness. Was treated at [facility] for rehab however discharged due to failure to progress. Now they desire SNF placement.       Charlesetta Shanks, MD 05/30/20 [phone]

## 2020-05-29 NOTE — Discharge Instructions (Addendum)
Our case manager contacted you about home health services.  They will be in touch to arrange from home health care.  Please make sure Troy Combs keeps drinking water and has a cane or walker available when walking.

## 2020-05-29 NOTE — ED Notes (Signed)
PTAR called @ 1841-per [place], RN called by Levada Dy

## 2020-05-29 NOTE — ED Notes (Signed)
Food and drink provided.

## 2020-05-29 NOTE — Evaluation (Signed)
Physical Therapy Evaluation Patient Details Name: Troy Combs MRN: [id number] DOB: [birth year] Today's Date: 05/29/2020   History of Present Illness  Pt is an 87 y/o male presenting to the ED after falls and difficulty ambulating. Also with severe posterior headache. PMH includes CAD s/p CABG, a flutter, and s/p back surgery.  Clinical Impression  Pt admitted secondary to problem above with deficits below. Pt requiring max A to perform bed mobility and transfers. Pt with heavy posterior lean, and unable to take steps safely with +1 assist. Pt reporting increased headache with upright posture. Currently recommending SNF level therapies at d/c to increase independence and safety with mobility, however, pt recently had SNF stay. If pt unable to go to SNF, would benefit from max [facility] services and DME below. Will continue to follow acutely.     Follow Up Recommendations SNF;Supervision/Assistance - 24 hour (max HH services if pt cannot go to SNF)    Equipment Recommendations  Hospital bed;Other (comment) (hoyer lift with pad)    Recommendations for Other Services       Precautions / Restrictions Precautions Precautions: Fall Precaution Comments: Reports 6 falls within the last couple of months. Restrictions Weight Bearing Restrictions: No      Mobility  Bed Mobility Overal bed mobility: Needs Assistance Bed Mobility: Supine to Sit;Sit to Supine     Supine to sit: Max assist Sit to supine: Max assist   General bed mobility comments: Max A for trunk assist and assist with scooting hips to EOB. Pt reporting increased headache with upright posture. Described it as "lightening jolts"    Transfers Overall transfer level: Needs assistance Equipment used: 1 person hand held assist Transfers: Sit to/from Stand Sit to Stand: Max assist         General transfer comment: Max A for lift assist and steadying. PT stood in front of pt and had pt hold to PT arms. Pt with heavy posterior  lean.  Ambulation/Gait                Stairs            Wheelchair Mobility    Modified Rankin (Stroke Patients Only)       Balance Overall balance assessment: Needs assistance Sitting-balance support: No upper extremity supported;Feet supported Sitting balance-Leahy Scale: Fair   Postural control: Posterior lean Standing balance support: Bilateral upper extremity supported;During functional activity Standing balance-Leahy Scale: Poor Standing balance comment: max A and posterior lean in standing                             Pertinent Vitals/Pain Pain Assessment: 0-10 Pain Score: 7  Pain Location: headache Pain Descriptors / Indicators: Headache Pain Intervention(s): Limited activity within patient's tolerance;Monitored during session;Repositioned    Home Living Family/patient expects to be discharged to:: Private residence Living Arrangements: Spouse/significant other;Children Available Help at Discharge: Family;Available 24 hours/day Type of Home: House Home Access: Level entry     Home Layout: One level Home Equipment: Shower seat - built in;Hand held shower head;Grab bars - tub/shower;Walker - 2 wheels;Transport chair      Prior Function Level of Independence: Needs assistance   Gait / Transfers Assistance Needed: Per wife, has had difficulty standing and walking with walker. When he cant walk has been using transport chair.  ADL's / Homemaking Assistance Needed: Has been sponge bathing per wife, and wife has assisted him.        Hand Dominance  Extremity/Trunk Assessment   Upper Extremity Assessment Upper Extremity Assessment: Generalized weakness    Lower Extremity Assessment Lower Extremity Assessment: Generalized weakness    Cervical / Trunk Assessment Cervical / Trunk Assessment: Other exceptions Cervical / Trunk Exceptions: Decreased cervical ROM secondary to pain.  Communication   Communication: No  difficulties  Cognition Arousal/Alertness: Awake/alert Behavior During Therapy: WFL for tasks assessed/performed Overall Cognitive Status: Within Functional Limits for tasks assessed                                        General Comments      Exercises     Assessment/Plan    PT Assessment Patient needs continued PT services  PT Problem List Decreased strength;Decreased activity tolerance;Decreased balance;Decreased mobility;Pain;Decreased knowledge of precautions       PT Treatment Interventions DME instruction;Gait training;Functional mobility training;Therapeutic activities;Therapeutic exercise;Balance training;Patient/family education    PT Goals (Current goals can be found in the Care Plan section)  Acute Rehab PT Goals Patient Stated Goal: to go to rehab PT Goal Formulation: With patient Time For Goal Achievement: 06/12/20 Potential to Achieve Goals: Fair    Frequency Min 2X/week   Barriers to discharge        Co-evaluation               AM-PAC PT "6 Clicks" Mobility  Outcome Measure Help needed turning from your back to your side while in a flat bed without using bedrails?: A Lot Help needed moving from lying on your back to sitting on the side of a flat bed without using bedrails?: A Lot Help needed moving to and from a bed to a chair (including a wheelchair)?: A Lot Help needed standing up from a chair using your arms (e.g., wheelchair or bedside chair)?: A Lot Help needed to walk in hospital room?: Total Help needed climbing 3-5 steps with a railing? : Total 6 Click Score: 10    End of Session Equipment Utilized During Treatment: Gait belt Activity Tolerance: Patient limited by pain Patient left: in bed;with call bell/phone within reach;with family/visitor present (on stretcher in ED) Nurse Communication: Mobility status PT Visit Diagnosis: Unsteadiness on feet (R26.81);Muscle weakness (generalized) (M62.81);History of falling  (Z91.81);Repeated falls (R29.6);Difficulty in walking, not elsewhere classified (R26.2)    Time: 2229-7989 PT Time Calculation (min) (ACUTE ONLY): 23 min   Charges:   PT Evaluation $PT Eval Moderate Complexity: 1 Mod PT Treatments $Therapeutic Activity: 8-22 mins        Lou Miner, DPT  Acute Rehabilitation Services  Pager: [phone] Office: [phone]   Rudean Hitt 05/29/2020, 10:25 AM

## 2020-05-29 NOTE — ED Provider Notes (Signed)
[facility] EMERGENCY DEPARTMENT Provider Note   CSN: [id number] Arrival date & time:        History Chief Complaint  Patient presents with  . Headache    Troy Combs is a 87 y.o. male.  The history is provided by the patient and the spouse.  Headache Pain location:  Occipital Quality:  Dull Radiates to: neck. Onset quality:  Gradual Timing:  Constant Progression:  Worsening Chronicity:  New Relieved by:  Nothing Worsened by:  Nothing Associated symptoms: neck pain, vomiting and weakness   Associated symptoms: no abdominal pain and no fever   Patient with history of CAD, autonomic dysfunction presents for continued headache.  Patient was seen in the hospital on January 29 after a fall and hit his head.  CAT scans and x-rays at that time were negative.  Since being discharged home from the ER, he has had persistent occipital headache and neck pain.  Tonight he appeared more confused per family and it was recommended he go to the ER Of note, patient was just discharged from [place] [facility] on January 28.  He fell after he got home.  Patient is not on anticoagulation    Past Medical History:  Diagnosis Date  . Allergic rhinitis 09/20/2014  . Anemia, unspecified   . Arthritis   . Atrial flutter ([place])    a. s/p RFCA remotely.  . Autonomic dysfunction   . Bladder neck obstruction   . Cataracts, bilateral   . Coronary atherosclerosis of unspecified type of vessel, native or graft    CABG 2002  . Diminished hearing, bilateral   . Dysthymic disorder   . Fall   . GERD (gastroesophageal reflux disease)   . Heart murmur    as a child  . Impaired glucose tolerance 12/02/2012  . Impotence of organic origin   . Lumbar radiculopathy   . Malignant melanoma of skin of upper limb, including shoulder ([place])   . Old myocardial infarction   . Personal history of malignant melanoma of skin   . Personal history of other diseases of digestive system   . PVC's  (premature ventricular contractions)   . Thoracic or lumbosacral neuritis or radiculitis, unspecified   . Unspecified essential hypertension     Patient Active Problem List   Diagnosis Date Noted  . Multiple closed fractures of ribs of left side 04/27/2020  . Fall 04/27/2020  . Intractable pain 04/27/2020  . Rib fractures 04/26/2020  . Postural dizziness with presyncope 08/13/2019  . Hypertensive urgency 08/12/2019  . Impacted cerumen of both ears 08/12/2019  . Personal history of malignant melanoma of skin   . Lumbar radiculopathy   . Heart murmur   . GERD (gastroesophageal reflux disease)   . Diminished hearing, bilateral   . Cataracts, bilateral   . Bladder neck obstruction   . Arthritis   . Constipation 12/12/2016  . Nausea 12/12/2016  . Coronary artery disease involving native coronary artery of native heart without angina pectoris 11/22/2016  . Dyslipidemia 11/22/2016  . History of coronary artery bypass surgery 11/22/2016  . Depression with anxiety   . Orthostasis 09/20/2016  . HNP (herniated nucleus pulposus), lumbar 02/02/2016  . Spinal stenosis of lumbar region 01/24/2016  . Sciatica, right side 01/24/2016  . Prediabetes 01/24/2016  . Otitis, externa, infective 01/24/2016  . Left lumbar radiculopathy 01/15/2016  . PVC's (premature ventricular contractions) 05/24/2015  . Peripheral polyneuropathy 03/06/2015  . BPPV (benign paroxysmal positional vertigo), right 03/06/2015  .  Headache 01/18/2015  . Vertigo 01/18/2015  . Allergic rhinitis 09/20/2014  . Dyspnea on exertion 08/10/2014  . Severe dizziness 08/10/2014  . Diarrhea 05/18/2014  . LLQ pain 05/13/2014  . Chronic meniscal tear of knee 09/22/2013  . Primary localized osteoarthrosis, lower leg 09/22/2013  . Neck pain, bilateral 12/18/2012  . Headache(784.0) 12/18/2012  . Binocular visual disturbance 12/18/2012  . Epiretinal membrane 12/17/2012  . Status post intraocular lens implant 12/17/2012  .  Preventative health care 12/02/2012  . Low back pain 12/02/2012  . Impaired glucose tolerance 12/02/2012  . Palpitations 10/09/2010  . B12 deficiency 08/30/2010  . Hearing loss 08/30/2010  . Tinnitus 08/30/2010  . Neurogenic orthostatic hypotension ([place]) 06/28/2009  . Mixed hyperlipidemia 05/15/2009  . CHEST PAIN UNSPECIFIED 11/23/2008  . Hypotension, unspecified 10/12/2008  . DIZZINESS 10/12/2008  . Shortness of breath 09/14/2008  . UNSPECIFIED ANEMIA 09/13/2008  . ANXIETY DEPRESSION 09/13/2008  . Old myocardial infarction 09/13/2008  . GASTROESOPHAGEAL REFLUX DISEASE, HX OF 09/13/2008  . LUMBAR RADICULOPATHY, LEFT 08/23/2008  . MELANOMA, SHOULDER 11/27/2006  . Essential hypertension 11/27/2006  . Coronary atherosclerosis 11/27/2006  . BPH associated with nocturia 11/27/2006  . ERECTILE DYSFUNCTION, ORGANIC 11/27/2006    Past Surgical History:  Procedure Laterality Date  . ARTHRODESIS  07/06/2002   of left long finger distal interphalangeal joint with Kirschner wire fixation X 3  . ATRIAL ABLATION SURGERY  05/04/2001   Dr. [name]  . CARDIAC CATHETERIZATION  09/05/2008   Revealing 4 of 4 patent grafts with native multivessel coronary artery disease, EF  of 60% without regional wall motion abnormalities.  Marland Kitchen CATARACT EXTRACTION, BILATERAL    . CHOLECYSTECTOMY    . COLONOSCOPY    . CORONARY ANGIOPLASTY  1993  . CORONARY ARTERY BYPASS GRAFT  10/17/2000   [name]. [name], [place]     AFTER LIVER TRAUMA AND HAND SURGERY  . EYE SURGERY    . HERNIA REPAIR     BILATERAL  . LEFT HEART CATH AND CORS/GRAFTS ANGIOGRAPHY N/A 04/03/2017   Procedure: LEFT HEART CATH AND CORS/GRAFTS ANGIOGRAPHY;  Surgeon: Leonie Man, MD;  Location: [facility];  Service: Cardiovascular;  Laterality: N/A;  . LUMBAR LAMINECTOMY/DECOMPRESSION MICRODISCECTOMY N/A 02/02/2016   Procedure: MICRODISCECTOMY LUMBAR FOUR- LUMBAR FIVE;  Surgeon: Ashok Pall, MD;  Location:  [facility];  Service: Neurosurgery;  Laterality: N/A;  MICRODISCECTOMY L4-L5  . LUMBAR LAMINECTOMY/DECOMPRESSION MICRODISCECTOMY Right 08/02/2016   Procedure: MICRODISCECTOMY LUMBAR FOUR- LUMBAR FIVE RIGHT;  Surgeon: Ashok Pall, MD;  Location: [facility];  Service: Neurosurgery;  Laterality: Right;  . punctured eardrum      to relieve blood build up   for ear infection  . TONSILLECTOMY         Family History  Problem Relation Age of Onset  . Heart disease Father   . Ataxia Neg Hx   . Chorea Neg Hx   . Dementia Neg Hx   . Mental retardation Neg Hx   . Migraines Neg Hx   . Multiple sclerosis Neg Hx   . Neurofibromatosis Neg Hx   . Neuropathy Neg Hx   . Parkinsonism Neg Hx   . Seizures Neg Hx   . Stroke Neg Hx     Social History   Tobacco Use  . Smoking status: Former Smoker    Packs/day: 1.00    Years: 3.00    Pack years: 3.00    Types: Cigarettes    Quit date: 09/21/1955  Years since quitting: 64.7  . Smokeless tobacco: Never Used  Vaping Use  . Vaping Use: Never used  Substance Use Topics  . Alcohol use: Yes    Alcohol/week: 0.0 standard drinks    Comment: one or two ounces a week  . Drug use: No    Home Medications Prior to Admission medications   Medication Sig Start Date End Date Taking? Authorizing Provider  aspirin EC 81 MG tablet Take 81 mg by mouth daily after supper.     [provider]  clotrimazole (LOTRIMIN) 1 % cream Apply 1 application topically 2 (two) times daily as needed (rash). Groin rash    [provider]  esomeprazole (NEXIUM) 20 MG capsule Take 20 mg by mouth daily at 12 noon.    [provider]  finasteride (PROSCAR) 5 MG tablet Take 1 tablet (5 mg total) by mouth daily after supper. 09/03/19   Oscar La, Arlo C, PA-C  Flaxseed, Linseed, (FLAX SEED OIL) 1000 MG CAPS Take 1 capsule by mouth daily at 6 (six) AM.    [provider]  gabapentin (NEURONTIN) 300 MG capsule Take 1 capsule (300 mg total) by mouth daily after  supper. 04/28/20   Rai, Vernelle Emerald, MD  hydrALAZINE (APRESOLINE) 25 MG tablet Take 50 mg by mouth daily as needed (high bp above 180).    [provider]  isosorbide mononitrate (IMDUR) 30 MG 24 hr tablet Take 1 tablet (30 mg total) by mouth daily. 04/28/20 07/27/20  Rai, Ripudeep K, MD  lisinopril (ZESTRIL) 5 MG tablet Take 5 mg by mouth daily. Patient not taking: Reported on 05/27/2020    [provider]  meclizine (ANTIVERT) 12.5 MG tablet Take 12.5 mg by mouth every 8 (eight) hours as needed. For Vertigo Patient not taking: Reported on 05/27/2020    [provider]  midodrine (PROAMATINE) 10 MG tablet TAKE 1 TABLET BY MOUTH THREE TIMES A DAY 10/29/19   Dorothy Spark, MD  nitroGLYCERIN (NITROSTAT) 0.4 MG SL tablet Place 1 tablet (0.4 mg total) under the tongue every 5 (five) minutes as needed for chest pain (x 3 doses). Reported on 06/15/2015 Patient taking differently: Place 0.4 mg under the tongue every 5 (five) minutes as needed for chest pain (x 3 doses). Has for emergency chest pains 09/03/19   Granville Lewis C, PA-C  ondansetron (ZOFRAN ODT) 4 MG disintegrating tablet Take 1 tablet (4 mg total) by mouth every 8 (eight) hours as needed for nausea or vomiting. Patient not taking: Reported on 05/27/2020 04/28/20   Rai, Vernelle Emerald, MD  OXYGEN Inhale 2 L into the lungs continuous.    [provider]  polyethylene glycol (MIRALAX / GLYCOLAX) 17 g packet Take 17 g by mouth 2 (two) times daily.    [provider]  rosuvastatin (CRESTOR) 10 MG tablet Take 1 tablet (10 mg total) by mouth daily. 09/03/19   Granville Lewis C, PA-C  sertraline (ZOLOFT) 100 MG tablet Take 1 tablet (100 mg total) by mouth daily. 09/03/19   Granville Lewis C, PA-C  tiZANidine (ZANAFLEX) 2 MG tablet Take 2 mg by mouth 2 (two) times daily as needed for muscle spasms.    [provider]  traMADol HCl 100 MG TABS Take 100 mg by mouth 3 (three) times daily. For Back/ Rib Pain 05/16/20    Yvonna Alanis, NP  vitamin B-12 (CYANOCOBALAMIN) 1000 MCG tablet Take 1,000 mcg by mouth daily.    [provider]    Allergies  Iohexol, Ciprofloxacin, and Other  Review of Systems   Review of Systems  Constitutional: Positive for appetite change. Negative for fever.  Cardiovascular: Negative for chest pain.  Gastrointestinal: Positive for vomiting. Negative for abdominal pain.  Musculoskeletal: Positive for neck pain.  Neurological: Positive for weakness and headaches.  All other systems reviewed and are negative.   Physical Exam Updated Vital Signs BP (!) 195/91   Pulse 83   Temp 98.3 F (36.8 C)   Resp (!) 24   SpO2 99%   Physical Exam CONSTITUTIONAL: Elderly, no acute distress HEAD: Normocephalic/atraumatic, no visible trauma, mild tenderness to the posterior scalp EYES: EOMI/PERRL ENMT: Mucous membranes moist SPINE/BACK: Mild cervical spine, no other spinal tenderness, no bruising/crepitance/stepoffs noted to spine CV: S1/S2 noted, no murmurs/rubs/gallops noted LUNGS: Lungs are clear to auscultation bilaterally, no apparent distress Chest-no diffuse tenderness or instability ABDOMEN: soft, nontender, no rebound or guarding, bowel sounds noted throughout abdomen GU:no cva tenderness NEURO: Pt is awake/alert, moves all extremitiesx4.  No facial droop.  Answers most questions appropriately EXTREMITIES: pulses normal/equal, full ROM, all other extremities/joints palpated/ranged and nontender SKIN: warm, color normal PSYCH: no abnormalities of mood noted, alert and oriented to situation  ED Results / Procedures / Treatments   Labs (all labs ordered are listed, but only abnormal results are displayed) Labs Reviewed  SARS CORONAVIRUS 2 (TAT 6-24 HRS)  CBC WITH DIFFERENTIAL/PLATELET  COMPREHENSIVE METABOLIC PANEL  URINALYSIS, ROUTINE W REFLEX MICROSCOPIC    EKG EKG Interpretation  Date/Time:  Monday May 29 2020 06:44:36 EST Ventricular Rate:  77 PR  Interval:    QRS Duration: 91 QT Interval:  392 QTC Calculation: 444 R Axis:   -11 Text Interpretation: Sinus rhythm Borderline T abnormalities, anterior leads Confirmed by Ripley Fraise [phone]) on 05/29/2020 7:03:21 AM   Radiology CT Head Wo Contrast  Result Date: 05/29/2020 CLINICAL DATA:  Multiple falls.  Neck trauma EXAM: CT HEAD WITHOUT CONTRAST CT CERVICAL SPINE WITHOUT CONTRAST TECHNIQUE: Multidetector CT imaging of the head and cervical spine was performed following the standard protocol without intravenous contrast. Multiplanar CT image reconstructions of the cervical spine were also generated. COMPARISON:  04/26/2020 FINDINGS: CT HEAD FINDINGS Brain: No evidence of acute infarction, hemorrhage, hydrocephalus, extra-axial collection or mass lesion/mass effect. Prominent generalized atrophy. Confluent chronic small vessel ischemia in the cerebral white matter. Vascular: No hyperdense vessel or unexpected calcification. Skull: Normal. Negative for fracture or focal lesion. Sinuses/Orbits: No acute finding. CT CERVICAL SPINE FINDINGS Alignment: No traumatic malalignment. Chronic degenerative C3-4, C4-5, and C7-T1 anterolisthesis. Generalized reversal of cervical lordosis. Skull base and vertebrae: No acute fracture. No primary bone lesion or focal pathologic process. Soft tissues and spinal canal: No prevertebral fluid or swelling. No visible canal hematoma. Disc levels: Multilevel facet spurring. Mid to lower cervical disc narrowing. Upper chest: No evidence of injury IMPRESSION: 1. No evidence of acute intracranial or cervical spine injury. 2. Brain atrophy and extensive chronic small vessel ischemia. Electronically Signed   By: Monte Fantasia M.D.   On: 05/29/2020 06:25   CT Cervical Spine Wo Contrast  Result Date: 05/29/2020 CLINICAL DATA:  Multiple falls.  Neck trauma EXAM: CT HEAD WITHOUT CONTRAST CT CERVICAL SPINE WITHOUT CONTRAST TECHNIQUE: Multidetector CT imaging of the head and  cervical spine was performed following the standard protocol without intravenous contrast. Multiplanar CT image reconstructions of the cervical spine were also generated. COMPARISON:  04/26/2020 FINDINGS: CT HEAD FINDINGS Brain: No evidence of acute infarction, hemorrhage, hydrocephalus, extra-axial collection or mass lesion/mass effect.  Prominent generalized atrophy. Confluent chronic small vessel ischemia in the cerebral white matter. Vascular: No hyperdense vessel or unexpected calcification. Skull: Normal. Negative for fracture or focal lesion. Sinuses/Orbits: No acute finding. CT CERVICAL SPINE FINDINGS Alignment: No traumatic malalignment. Chronic degenerative C3-4, C4-5, and C7-T1 anterolisthesis. Generalized reversal of cervical lordosis. Skull base and vertebrae: No acute fracture. No primary bone lesion or focal pathologic process. Soft tissues and spinal canal: No prevertebral fluid or swelling. No visible canal hematoma. Disc levels: Multilevel facet spurring. Mid to lower cervical disc narrowing. Upper chest: No evidence of injury IMPRESSION: 1. No evidence of acute intracranial or cervical spine injury. 2. Brain atrophy and extensive chronic small vessel ischemia. Electronically Signed   By: Monte Fantasia M.D.   On: 05/29/2020 06:25    Procedures Procedures   Medications Ordered in ED Medications  lactated ringers bolus 1,000 mL (has no administration in time range)    ED Course  I have reviewed the triage vital signs and the nursing notes.  Pertinent labs & imaging results that were available during my care of the patient were reviewed by me and considered in my medical decision making (see chart for details).    MDM Rules/Calculators/A&P                          6:11 AM Patient presents from home with headache and confusion.  He fell on January 28, but had negative CT imaging and x-rays at that time.  He now reports persistent headache and now has neck pain.  Wife reports  patient was confused at home as well he has had very little p.o. intake and appears dehydrated. Patient was just released from [facility]. Will obtain CT head and C-spine, labs and EKG 7:07 AM CT imaging negative.  Patient does have orthostatic hypotension.  Labs are pending at this time. Wife is requesting a palliative consult.  It appears the patient is having difficulty being managed at home as he has frequent falls as not taking any oral intake Palliative and transitions of care consult placed Signed out to Dr. Johnney Killian at shift change Final Clinical Impression(s) / ED Diagnoses Final diagnoses:  Orthostatic hypotension    Rx / DC Orders ED Discharge Orders    None       Ripley Fraise, MD 05/29/20 [phone]

## 2020-05-29 NOTE — ED Provider Notes (Signed)
CM reports she's discussed with family members who have agreed to have the patient come home tonight.  Home health aid and nursing arranged.  CM to arrange for [facility]AR home.     Wyvonnia Dusky, MD 05/29/20 [phone]

## 2020-05-30 NOTE — ED Notes (Signed)
Discharges instructions provided to patient and family. Verbalized understanding. IV lock removed. Escorted out of ED via Consult[employer] with [employer].

## 2020-07-28 DEATH — deceased

## 2021-01-02 ENCOUNTER — Ambulatory Visit: Payer: Medicare Other | Admitting: Neurology
